# Patient Record
Sex: Female | Born: 1950 | Race: White | Hispanic: No | Marital: Married | Smoking: Former smoker
Health system: Southern US, Community
[De-identification: ages and names within clinical notes are randomized; demographics above are authoritative.]

## PROBLEM LIST (undated history)

## (undated) DIAGNOSIS — I10 Essential (primary) hypertension: Secondary | ICD-10-CM

## (undated) DIAGNOSIS — Z8041 Family history of malignant neoplasm of ovary: Secondary | ICD-10-CM

## (undated) DIAGNOSIS — Z8679 Personal history of other diseases of the circulatory system: Secondary | ICD-10-CM

## (undated) DIAGNOSIS — Z9889 Other specified postprocedural states: Secondary | ICD-10-CM

## (undated) DIAGNOSIS — E785 Hyperlipidemia, unspecified: Secondary | ICD-10-CM

## (undated) DIAGNOSIS — K219 Gastro-esophageal reflux disease without esophagitis: Secondary | ICD-10-CM

## (undated) DIAGNOSIS — Z780 Asymptomatic menopausal state: Secondary | ICD-10-CM

## (undated) DIAGNOSIS — E119 Type 2 diabetes mellitus without complications: Secondary | ICD-10-CM

## (undated) DIAGNOSIS — Z923 Personal history of irradiation: Secondary | ICD-10-CM

## (undated) DIAGNOSIS — N6009 Solitary cyst of unspecified breast: Secondary | ICD-10-CM

## (undated) DIAGNOSIS — Z808 Family history of malignant neoplasm of other organs or systems: Secondary | ICD-10-CM

## (undated) HISTORY — DX: Hyperlipidemia, unspecified: E78.5

## (undated) HISTORY — DX: Solitary cyst of unspecified breast: N60.09

## (undated) HISTORY — DX: Asymptomatic menopausal state: Z78.0

## (undated) HISTORY — DX: Essential (primary) hypertension: I10

## (undated) HISTORY — DX: Family history of malignant neoplasm of ovary: Z80.41

## (undated) HISTORY — DX: Personal history of other diseases of the circulatory system: Z86.79

## (undated) HISTORY — DX: Family history of malignant neoplasm of other organs or systems: Z80.8

## (undated) HISTORY — DX: Other specified postprocedural states: Z98.890

## (undated) HISTORY — PX: COLONOSCOPY: SHX174

## (undated) MED FILL — Dexamethasone Sodium Phosphate Inj 100 MG/10ML: INTRAMUSCULAR | Qty: 1 | Status: AC

---

## 1997-03-27 HISTORY — PX: BREAST CYST ASPIRATION: SHX578

## 2004-03-27 HISTORY — PX: CHOLECYSTECTOMY: SHX55

## 2004-03-27 HISTORY — PX: APPENDECTOMY: SHX54

## 2004-08-02 ENCOUNTER — Other Ambulatory Visit: Payer: Self-pay

## 2004-08-02 ENCOUNTER — Inpatient Hospital Stay: Payer: Self-pay | Admitting: Surgery

## 2007-01-18 LAB — HM COLONOSCOPY: HM Colonoscopy: NORMAL

## 2008-03-27 DIAGNOSIS — Z9889 Other specified postprocedural states: Secondary | ICD-10-CM

## 2008-03-27 HISTORY — DX: Other specified postprocedural states: Z98.890

## 2009-06-30 ENCOUNTER — Ambulatory Visit: Payer: Self-pay | Admitting: Gastroenterology

## 2009-08-06 ENCOUNTER — Ambulatory Visit: Payer: Self-pay

## 2009-08-12 ENCOUNTER — Ambulatory Visit: Payer: Self-pay

## 2010-12-02 ENCOUNTER — Observation Stay: Payer: Self-pay | Admitting: Internal Medicine

## 2010-12-27 ENCOUNTER — Ambulatory Visit (INDEPENDENT_AMBULATORY_CARE_PROVIDER_SITE_OTHER): Payer: BC Managed Care – PPO | Admitting: Internal Medicine

## 2010-12-27 ENCOUNTER — Encounter: Payer: Self-pay | Admitting: Internal Medicine

## 2010-12-27 VITALS — BP 163/77 | HR 84 | Temp 98.3°F | Resp 16 | Ht 66.5 in | Wt 192.2 lb

## 2010-12-27 DIAGNOSIS — E785 Hyperlipidemia, unspecified: Secondary | ICD-10-CM

## 2010-12-27 DIAGNOSIS — Z8679 Personal history of other diseases of the circulatory system: Secondary | ICD-10-CM | POA: Insufficient documentation

## 2010-12-27 DIAGNOSIS — I10 Essential (primary) hypertension: Secondary | ICD-10-CM | POA: Insufficient documentation

## 2010-12-27 DIAGNOSIS — Z9889 Other specified postprocedural states: Secondary | ICD-10-CM | POA: Insufficient documentation

## 2010-12-27 DIAGNOSIS — N6009 Solitary cyst of unspecified breast: Secondary | ICD-10-CM | POA: Insufficient documentation

## 2010-12-27 MED ORDER — PRAVASTATIN SODIUM 20 MG PO TABS: 20.0000 mg | ORAL_TABLET | Freq: Every evening | ORAL | Status: DC

## 2010-12-27 NOTE — Assessment & Plan Note (Signed)
Not at goal.  Will increase to 20 mg bid .

## 2010-12-27 NOTE — Assessment & Plan Note (Signed)
goalis , 100 given concern for angina/CAD.  Starting pravastatin at 20 mg edaily.  Repeat lipids in 6 week.s

## 2010-12-27 NOTE — Progress Notes (Signed)
  Subjective:    Patient ID: Ashlee Cox, female    DOB: 02-25-1951, 60 y.o.   MRN: [id number]  HPI  New patient, 58 you white female discharged from [facility] after observational stay earlier in September.  No PMH except hypertension, was admitted to hospitalist service on sept 9 th  with sudden onset of light headedness  and headache  while occurred while working a strenuous 3rd shift at [employer] .  She became diaphoretic and pale, develed left arm aching and fingers went numb.  Arm pain has improved  But her still aches.  She wad once set of cardiac enzymes,  An abnormal EKG,  A normal CXR, ECHO, MRI of brain and Carotids.  She is transferring care form [facility]. Past Medical History  Diagnosis Date  . H/O: rheumatic fever   . Menopause     at age 89  . 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 Cyst, breast     benign   No current outpatient prescriptions on file prior to visit.    Review of Systems  Constitutional: Negative for fever, chills and unexpected weight change.  HENT: Negative for hearing loss, ear pain, nosebleeds, congestion, sore throat, facial swelling, rhinorrhea, sneezing, mouth sores, trouble swallowing, neck pain, neck stiffness, voice change, postnasal drip, sinus pressure, tinnitus and ear discharge.   Eyes: Negative for pain, discharge, redness and visual disturbance.  Respiratory: Negative for cough, chest tightness, shortness of breath, wheezing and stridor.   Cardiovascular: Negative for chest pain, palpitations and leg swelling.  Musculoskeletal: Negative for myalgias and arthralgias.  Skin: Negative for color change and rash.  Neurological: Positive for headaches. Negative for dizziness, weakness and light-headedness.  Hematological: Negative for adenopathy.       Objective:   Physical Exam  Constitutional: She is oriented to person, place, and time. She appears well-developed and well-nourished.   HENT:  Mouth/Throat: Oropharynx is clear and moist.  Eyes: EOM are normal. Pupils are equal, round, and reactive to light. No scleral icterus.  Neck: Normal range of motion. Neck supple. No JVD present. No thyromegaly present.  Cardiovascular: Normal rate, regular rhythm, normal heart sounds and intact distal pulses.   Pulmonary/Chest: Effort normal and breath sounds normal.  Abdominal: Soft. Bowel sounds are normal. She exhibits no mass. There is no tenderness.  Musculoskeletal: Normal range of motion. She exhibits no edema.  Lymphadenopathy:    She has no cervical adenopathy.  Neurological: She is alert and oriented to person, place, and time.  Skin: Skin is warm and dry.  Psychiatric: She has a normal mood and affect.          Assessment & Plan:  Atypical chest pain:  Given her negative workup for CVA but abnormal EKG during recent admission , will refer to Dossie Arbour for cardiology evaluation.  Her epsiose of left arm pain was accompaneid by hypotension and diaphoresis so I did not give her SL nitroglycerin.

## 2010-12-27 NOTE — Patient Instructions (Addendum)
There are two white wines that are sweet varietals:  riesling ,  And gerwuzterammer.   Sake is also sweet but very potent,   We are going to increase your lisinopril to 40 mg daily,  Either all at once, or split into 20 mg twice daily

## 2010-12-30 ENCOUNTER — Telehealth: Payer: Self-pay | Admitting: Internal Medicine

## 2010-12-30 NOTE — Telephone Encounter (Signed)
Patient called and stated she had an appt and Dr. Darrick Huntsman wanted her to have a stress test done, but she hasn't heard anything.  Please advise patient when ready.

## 2011-01-02 ENCOUNTER — Encounter: Payer: Self-pay | Admitting: Internal Medicine

## 2011-01-12 ENCOUNTER — Encounter: Payer: Self-pay | Admitting: Cardiovascular Disease

## 2011-01-12 ENCOUNTER — Ambulatory Visit (INDEPENDENT_AMBULATORY_CARE_PROVIDER_SITE_OTHER): Payer: BC Managed Care – PPO | Admitting: Cardiovascular Disease

## 2011-01-12 DIAGNOSIS — I10 Essential (primary) hypertension: Secondary | ICD-10-CM

## 2011-01-12 DIAGNOSIS — R9431 Abnormal electrocardiogram [ECG] [EKG]: Secondary | ICD-10-CM

## 2011-01-12 DIAGNOSIS — E785 Hyperlipidemia, unspecified: Secondary | ICD-10-CM

## 2011-01-12 DIAGNOSIS — M79602 Pain in left arm: Secondary | ICD-10-CM

## 2011-01-12 DIAGNOSIS — M79609 Pain in unspecified limb: Secondary | ICD-10-CM

## 2011-01-12 NOTE — Patient Instructions (Signed)
You are doing well. No medication changes were made. We will schedule you for a treadmill study  Please call us if you have new issues that need to be addressed

## 2011-01-12 NOTE — Assessment & Plan Note (Signed)
Etiology of her arm discomfort and headache is uncertain.  It is also concerning for a small TIA. Uncertain if she could have a PFO as a cause of possible TIA. Treatment for now would be aspirin. She has a repeat neurologic-type event, the echocardiogram was normal study would be indicated.

## 2011-01-12 NOTE — Assessment & Plan Note (Signed)
We would agree with continued aggressive medical management for cholesterol.

## 2011-01-12 NOTE — Progress Notes (Signed)
Patient ID: Ashlee Cox, female    DOB: 05/01/1950, 60 y.o.   MRN: [id number]  HPI Comments: Ashlee Cox is a very pleasant 60 year old woman, patient of Dr. Darrick Huntsman who presents by referral for evaluation of symptoms of left arm discomfort and right head pain, abnormal EKG.  She reports that on September 7 she was working at [employer]. She works third shift. 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 She had acute onset of malaise, feeling sick, sweaty as if she was "going down ". She lay down and had a fan on her and she started to feel better. She took an extra 45 minutes and when she woke up, she had left arm numbness and tingling, discomfort and pain in the right side of her head. EMTs were called after she got concerned and she had talked to her son who is in the medical field. EMTs noticed that her blood pressure was adequate. In the hospital, she was noted to have an abnormal EKG with diffuse ST abnormality, negative cardiac enzymes times one, other workup including CT scan, MRI, carotid ultrasound was essentially normal./.  Echocardiogram was also essentially normal with ejection fraction estimated at 50-55%. Saline Doppler study looking for PFO or ASD was not performed.  She was discharged on aspirin p.o. She denies any further episodes of left arm discomfort since that time. She has been back at work and has been active with no complaints  EKG today shows normal sinus rhythm with rate of 71 beats per minute with nonspecific ST abnormality in V4 through V6, T wave abnormality in one and aVL EKG done previously on October 7 shows diffuse ST and T wave abnormality in V3 through V6, T wave abnormality in one and aVL  Outpatient Encounter Prescriptions as of 01/12/2011  Medication Sig Dispense Refill  . aspirin 81 MG tablet Take 81 mg by mouth daily.        Marland Kitchen lisinopril (PRINIVIL,ZESTRIL) 20 MG tablet Take 40 mg by mouth daily.        . Multiple Vitamin (MULTIVITAMIN) tablet Take 1 tablet by mouth daily.        Marland Kitchen omeprazole (PRILOSEC) 20 MG capsule Take 20 mg by mouth daily.        . pravastatin (PRAVACHOL) 20 MG tablet Take 1 tablet (20 mg total) by mouth every evening.  30 tablet  11      Review of Systems  Constitutional: Negative.   Eyes: Negative.   Respiratory: Negative.   Cardiovascular: Negative.   Gastrointestinal: Negative.   Musculoskeletal: Negative.        Left arm discomfort  Skin: Negative.   Neurological: Positive for headaches.  Hematological: Negative.   Psychiatric/Behavioral: Negative.   All other systems reviewed and are negative.    BP 156/95  Pulse 76  Ht 5\' 7"  (1.702 m)  Wt 194 lb (87.998 kg)  BMI 30.38 kg/m2   Physical Exam  Nursing note and vitals reviewed. Constitutional: She is oriented to person, place, and time. She appears well-developed and well-nourished.  HENT:  Head: Normocephalic.  Nose: Nose normal.  Mouth/Throat: Oropharynx is clear and moist.  Eyes: Conjunctivae are normal. Pupils are equal, round, and reactive to light.  Neck: Normal range of motion. Neck supple. No JVD present.  Cardiovascular: Normal rate, regular rhythm, S1 normal, S2 normal, normal heart sounds and intact distal pulses.  Exam reveals no gallop and no friction  rub.   No murmur heard. Pulmonary/Chest: Effort normal and breath sounds normal. No respiratory distress. She has no wheezes. She has no rales. She exhibits no tenderness.  Abdominal: Soft. Bowel sounds are normal. She exhibits no distension. There is no tenderness.  Musculoskeletal: Normal range of motion. She exhibits no edema and no tenderness.  Lymphadenopathy:    She has no cervical adenopathy.  Neurological: She is alert and oriented to person, place, and time. Coordination normal.  Skin: Skin is warm and dry. No rash noted. No erythema.  Psychiatric: She has a normal mood and affect. Her behavior is normal. Judgment and  thought content normal.         Assessment and Plan

## 2011-01-12 NOTE — Assessment & Plan Note (Signed)
EKG was clearly abnormal on October 7. These abnormalities are still present though to a small degree. Continued nonspecific ST and T wave abnormality in the anterolateral leads. Given her recent left arm discomfort, we will schedule her for a routine treadmill study. She has been active over the past week with no symptoms of chest pain or left arm pain with exertion. Hopefully a treadmill study would help risk stratify her. If she has significant lateral ST abnormality with treadmill, we would order a Myoview.

## 2011-01-12 NOTE — Assessment & Plan Note (Signed)
Blood pressure is mildly elevated on today's visit. We've asked her to monitor her blood pressure at home and followup with Dr. Darrick Huntsman.

## 2011-01-13 ENCOUNTER — Encounter: Payer: Self-pay | Admitting: Cardiovascular Disease

## 2011-01-20 ENCOUNTER — Ambulatory Visit (INDEPENDENT_AMBULATORY_CARE_PROVIDER_SITE_OTHER): Payer: BC Managed Care – PPO | Admitting: Cardiovascular Disease

## 2011-01-20 ENCOUNTER — Encounter: Payer: Self-pay | Admitting: Cardiovascular Disease

## 2011-01-20 DIAGNOSIS — M79602 Pain in left arm: Secondary | ICD-10-CM

## 2011-01-20 DIAGNOSIS — R9431 Abnormal electrocardiogram [ECG] [EKG]: Secondary | ICD-10-CM

## 2011-01-20 DIAGNOSIS — M79609 Pain in unspecified limb: Secondary | ICD-10-CM

## 2011-01-20 NOTE — Progress Notes (Signed)
Exercise Treadmill Test  Treadmill ordered for recent epsiodes of left arm pain.  Resting EKG shows NSR with rate of 83 bpm, diffuse nonspecific ST ABN (seen on previous EKG from 2011) Resting blood pressure of 150/80 Stand bruce protocal was used.  Patient exercised for 6 min 01 sec,  Peak heart rate of 150 bpm.  This was 93% of the maximum predicted heart rate (target heart rate 137). No symptoms of chest pain or lightheadedness were reported at peak stress or in recovery.  Peak Blood pressure recorded was 190/82. Heart rate at 3 minutes in recovery was 103 bpm. No significant ST change noted at peak exercise or in recovery concerning for ischemia.  FINAL IMPRESSION: Normal exercise stress test. No significant EKG changes concerning for ischemia. Average exercise tolerance. She does have baseline nonspecific ST ABN, seen previously in 2011.

## 2011-01-20 NOTE — Patient Instructions (Signed)
Normal stress test No further testing at this time. Please call if you have additional episodes of chest or arm pain.

## 2011-02-07 ENCOUNTER — Other Ambulatory Visit (INDEPENDENT_AMBULATORY_CARE_PROVIDER_SITE_OTHER): Payer: BC Managed Care – PPO | Admitting: *Deleted

## 2011-02-07 ENCOUNTER — Telehealth: Payer: Self-pay | Admitting: Internal Medicine

## 2011-02-07 DIAGNOSIS — E785 Hyperlipidemia, unspecified: Secondary | ICD-10-CM

## 2011-02-07 LAB — COMPREHENSIVE METABOLIC PANEL
Albumin: 4.2 g/dL (ref 3.5–5.2)
BUN: 23 mg/dL (ref 6–23)
CO2: 24 mEq/L (ref 19–32)
GFR: 58.44 mL/min — ABNORMAL LOW (ref >60.00)
Glucose, Bld: 82 mg/dL (ref 70–99)
Potassium: 4 mEq/L (ref 3.5–5.1)
Sodium: 140 mEq/L (ref 135–145)
Total Protein: 7.8 g/dL (ref 6.0–8.3)

## 2011-02-07 LAB — LIPID PANEL: Cholesterol: 187 mg/dL (ref 0–200)

## 2011-02-07 NOTE — Telephone Encounter (Signed)
Patient is asking if she can get rx for lisinopril 40mg  since she was told to increase to 40 mg daily and she has been taking two of the 20 mg.

## 2011-02-07 NOTE — Telephone Encounter (Signed)
Pt   Needs refill on lisinopril 40mg  tab internl lb The rx was for 20 mg but she said dr Darrick Huntsman double this meds she is taking 2 of the 20mg  can she get this in the 40mg  [place] [address]  She only has 3 days left

## 2011-02-09 ENCOUNTER — Other Ambulatory Visit: Payer: Self-pay | Admitting: Internal Medicine

## 2011-02-09 MED ORDER — LISINOPRIL 40 MG PO TABS: 40.0000 mg | ORAL_TABLET | Freq: Every day | ORAL | Status: DC

## 2011-02-09 MED ORDER — PRAVASTATIN SODIUM 40 MG PO TABS: 40.0000 mg | ORAL_TABLET | Freq: Every day | ORAL | Status: DC

## 2011-02-09 NOTE — Progress Notes (Signed)
Addended by: Darletta Moll A on: 02/09/2011 11:55 AM   Modules accepted: Orders

## 2011-03-06 ENCOUNTER — Other Ambulatory Visit: Payer: Self-pay | Admitting: Internal Medicine

## 2011-03-08 MED ORDER — OMEPRAZOLE 20 MG PO CPDR: 20.0000 mg | DELAYED_RELEASE_CAPSULE | Freq: Every day | ORAL | Status: DC

## 2011-03-08 MED ORDER — PRAVASTATIN SODIUM 40 MG PO TABS: 40.0000 mg | ORAL_TABLET | Freq: Every day | ORAL | Status: DC

## 2011-03-09 ENCOUNTER — Ambulatory Visit: Payer: Self-pay | Admitting: Internal Medicine

## 2011-03-20 LAB — HM MAMMOGRAPHY: HM Mammogram: NORMAL

## 2011-04-10 ENCOUNTER — Encounter: Payer: Self-pay | Admitting: Internal Medicine

## 2011-07-24 ENCOUNTER — Other Ambulatory Visit: Payer: Self-pay | Admitting: Internal Medicine

## 2012-01-12 ENCOUNTER — Other Ambulatory Visit: Payer: Self-pay | Admitting: Internal Medicine

## 2012-01-12 NOTE — Telephone Encounter (Signed)
Refill request for Pravastatin 20 mg ok to refill?

## 2012-01-13 NOTE — Telephone Encounter (Signed)
Sent to pharmacy 

## 2012-01-18 ENCOUNTER — Ambulatory Visit (INDEPENDENT_AMBULATORY_CARE_PROVIDER_SITE_OTHER): Payer: BC Managed Care – PPO | Admitting: Internal Medicine

## 2012-01-18 ENCOUNTER — Encounter: Payer: Self-pay | Admitting: Internal Medicine

## 2012-01-18 ENCOUNTER — Telehealth: Payer: Self-pay | Admitting: *Deleted

## 2012-01-18 VITALS — BP 162/90 | HR 78 | Temp 98.2°F | Ht 66.5 in | Wt 197.0 lb

## 2012-01-18 DIAGNOSIS — R7309 Other abnormal glucose: Secondary | ICD-10-CM

## 2012-01-18 DIAGNOSIS — R5383 Other fatigue: Secondary | ICD-10-CM

## 2012-01-18 DIAGNOSIS — E785 Hyperlipidemia, unspecified: Secondary | ICD-10-CM

## 2012-01-18 DIAGNOSIS — Z79899 Other long term (current) drug therapy: Secondary | ICD-10-CM

## 2012-01-18 DIAGNOSIS — I10 Essential (primary) hypertension: Secondary | ICD-10-CM

## 2012-01-18 DIAGNOSIS — R5381 Other malaise: Secondary | ICD-10-CM

## 2012-01-18 DIAGNOSIS — R739 Hyperglycemia, unspecified: Secondary | ICD-10-CM

## 2012-01-18 DIAGNOSIS — F411 Generalized anxiety disorder: Secondary | ICD-10-CM

## 2012-01-18 MED ORDER — PRAVASTATIN SODIUM 40 MG PO TABS: 40.0000 mg | ORAL_TABLET | Freq: Every day | ORAL | Status: DC

## 2012-01-18 MED ORDER — LISINOPRIL 40 MG PO TABS: 40.0000 mg | ORAL_TABLET | Freq: Every day | ORAL | Status: DC

## 2012-01-18 MED ORDER — ALPRAZOLAM 0.25 MG PO TABS: 0.2500 mg | ORAL_TABLET | Freq: Two times a day (BID) | ORAL | Status: DC | PRN

## 2012-01-18 NOTE — Progress Notes (Signed)
Patient ID: Ashlee Cox, female   DOB: 1950/03/31, 61 y.o.   MRN: [id number]  Patient Active Problem List  Diagnosis  . H/O: rheumatic fever  . History of colonoscopy  . Cyst, breast  . Hypertension  . Hyperlipidemia LDL goal < 100  . Abnormal EKG  . Anxiety state    Subjective:  CC:   No chief complaint on file.   HPI:   Ashlee Riceis a 61 y.o. female who presents Anxiety, uncontrolled, with fatigue, insomnia.  Family stressors trigger.  Her husband has been diagnosed with metastatic prostate Cancer, and she is caring for him at home.  She is also involved with the care and management of her sister who is widowed,  has diabetes and PAD/retinopathy and cannot drive . Her sister is moving into her house in the very near future because of financial and physical disabilities.  Patient is working 3rd shift At [employer].  She is averaging 5 hours of sleep daily .   Past Medical History  Diagnosis Date  . H/O: rheumatic fever   . Menopause     at age 69  . 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 Cyst, breast     benign    Past Surgical History  Procedure Date  . Appendectomy 2006  . Cholecystectomy 2006  . Vaginal delivery     x3         The following portions of the patient's history were reviewed and updated as appropriate: Allergies, current medications, and problem list.    Review of Systems:   12 Pt  review of systems was negative except those addressed in the HPI,     History   Social History  . Marital Status: Married    Spouse Name: N/A    Number of Children: N/A  . 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 Smoking status: Former Smoker -- 1 years    Types: Cigarettes    Quit date: 12/26/2005  . Smokeless tobacco: Never Used   Comment: smoked for less than 1 years,  1 cig/day  . Alcohol Use: No  . Drug Use: No  . Sexually Active: Not  on file   Other Topics Concern  . Not on file   Social History Narrative  . No narrative on file    Objective:  BP 162/90  Pulse 78  Temp 98.2 F (36.8 C)  Ht 5' 6.5" (1.689 m)  Wt 197 lb (89.359 kg)  BMI 31.32 kg/m2  SpO2 97%  General appearance: alert, cooperative and appears stated age Ears: normal TM's and external ear canals both ears Throat: lips, mucosa, and tongue normal; teeth and gums normal Neck: no adenopathy, no carotid bruit, supple, symmetrical, trachea midline and thyroid not enlarged, symmetric, no tenderness/mass/nodules Back: symmetric, no curvature. ROM normal. No CVA tenderness. Lungs: clear to auscultation bilaterally Heart: regular rate and rhythm, S1, S2 normal, no murmur, click, rub or gallop Abdomen: soft, non-tender; bowel sounds normal; no masses,  no organomegaly Pulses: 2+ and symmetric Skin: Skin color, texture, turgor normal. No rashes or lesions Lymph nodes: Cervical, supraclavicular, and axillary nodes normal.  Assessment and Plan:  Hypertension And lisinopril 20 mg daily. Today's blood pressure is elevated which may be due to her untreated anxiety. I've asked her check her blood pressure several times over the next several  weeks and report back to me.  Anxiety state Secondary to recent family stressors including the diagnosis of metastatic cancer in years husband complicated by her responsibilities which her sister who is widowed and disabled. Spent 20 minutes counseling her on her need to ask for help. Specifically a recommended that she contact her husband's physician in order to get to home health nursing assistance. I've also recommended that she set boundaries with her sister who is at high risk for becoming more dependent on patient when she is moved in with her. Trial of alprazolam for sleep and his initiation and management of other panic symptoms. We'll discuss SSRI therapy at her next visit if still requiring use of alprazolam daily  basis.   Updated Medication List Outpatient Encounter Prescriptions as of 01/18/2012  Medication Sig Dispense Refill  . aspirin 81 MG tablet Take 81 mg by mouth daily.        . Cholecalciferol (VITAMIN D-3) 1000 UNITS CAPS Take 1,000 Units by mouth daily.      Marland Kitchen lisinopril (PRINIVIL,ZESTRIL) 40 MG tablet Take 1 tablet (40 mg total) by mouth daily.  90 tablet  5  . Multiple Vitamin (MULTIVITAMIN) tablet Take 1 tablet by mouth daily.        Marland Kitchen omeprazole (PRILOSEC) 20 MG capsule Take 1 capsule (20 mg total) by mouth daily.  90 capsule  3  . DISCONTD: lisinopril (PRINIVIL,ZESTRIL) 40 MG tablet TAKE ONE TABLET BY MOUTH EVERY DAY  30 tablet  5  . DISCONTD: pravastatin (PRAVACHOL) 20 MG tablet TAKE ONE TABLET BY MOUTH IN THE EVENING  30 tablet  10  . ALPRAZolam (XANAX) 0.25 MG tablet Take 1 tablet (0.25 mg total) by mouth 2 (two) times daily as needed for sleep.  60 tablet  3  . pravastatin (PRAVACHOL) 40 MG tablet Take 1 tablet (40 mg total) by mouth daily.  30 tablet  3  . DISCONTD: pravastatin (PRAVACHOL) 40 MG tablet Take 1 tablet (40 mg total) by mouth daily.  30 tablet  3     Orders Placed This Encounter  Procedures  . HM MAMMOGRAPHY  . HM PAP SMEAR  . Lipid panel  . Comprehensive metabolic panel  . TSH  . LDL cholesterol, direct  . HM COLONOSCOPY    Return in about 3 months (around 04/19/2012).

## 2012-01-18 NOTE — Patient Instructions (Addendum)
I am prescribing alprazolam for anxiety induced insomnia.  Start with 1/2 tablet and repeat in 1/2 hr if no sleep.  You may need to increase to 2 tablets ,  You have 60/month in case you need them.  Can also be used during the day or night for panic   Check bp and pulse a few times over the next 2 weeks,  Call the office if bp is consistently > 140/80  And record pulse as well

## 2012-01-18 NOTE — Telephone Encounter (Signed)
Called patient to let her know that the script for Alprazolam has been faxed to the pharmacy. She verbalized understanding

## 2012-01-19 LAB — COMPREHENSIVE METABOLIC PANEL
ALT: 27 U/L (ref 0–35)
CO2: 23 mEq/L (ref 19–32)
Calcium: 9.8 mg/dL (ref 8.4–10.5)
Chloride: 109 mEq/L (ref 96–112)
GFR: 67.2 mL/min (ref >60.00)
Glucose, Bld: 121 mg/dL — ABNORMAL HIGH (ref 70–99)
Sodium: 141 mEq/L (ref 135–145)
Total Bilirubin: 0.7 mg/dL (ref 0.3–1.2)
Total Protein: 7.7 g/dL (ref 6.0–8.3)

## 2012-01-19 LAB — LIPID PANEL
Cholesterol: 210 mg/dL — ABNORMAL HIGH (ref 0–200)
Total CHOL/HDL Ratio: 5

## 2012-01-19 LAB — TSH: TSH: 1.55 u[IU]/mL (ref 0.35–5.50)

## 2012-01-20 ENCOUNTER — Encounter: Payer: Self-pay | Admitting: Internal Medicine

## 2012-01-20 DIAGNOSIS — F411 Generalized anxiety disorder: Secondary | ICD-10-CM | POA: Insufficient documentation

## 2012-01-20 NOTE — Assessment & Plan Note (Signed)
And lisinopril 20 mg daily. Today's blood pressure is elevated which may be due to her untreated anxiety. I've asked her check her blood pressure several times over the next several weeks and report back to me.

## 2012-01-20 NOTE — Assessment & Plan Note (Signed)
Secondary to recent family stressors including the diagnosis of metastatic cancer in years husband complicated by her responsibilities which her sister who is widowed and disabled. Spent 20 minutes counseling her on her need to ask for help. Specifically a recommended that she contact her husband's physician in order to get to home health nursing assistance. I've also recommended that she set boundaries with her sister who is at high risk for becoming more dependent on patient when she is moved in with her. Trial of alprazolam for sleep and his initiation and management of other panic symptoms. We'll discuss SSRI therapy at her next visit if still requiring use of alprazolam daily basis.

## 2012-01-21 MED ORDER — SIMVASTATIN 40 MG PO TABS: 40.0000 mg | ORAL_TABLET | Freq: Every day | ORAL | Status: DC

## 2012-01-21 NOTE — Addendum Note (Signed)
Addended by: Sherlene Shams on: 01/21/2012 12:41 PM   Modules accepted: Orders

## 2012-04-02 ENCOUNTER — Encounter: Payer: Self-pay | Admitting: Internal Medicine

## 2012-04-02 ENCOUNTER — Ambulatory Visit (INDEPENDENT_AMBULATORY_CARE_PROVIDER_SITE_OTHER): Payer: BC Managed Care – PPO | Admitting: Internal Medicine

## 2012-04-02 VITALS — BP 160/92 | HR 90 | Temp 97.9°F | Resp 16 | Wt 200.5 lb

## 2012-04-02 DIAGNOSIS — Z79899 Other long term (current) drug therapy: Secondary | ICD-10-CM

## 2012-04-02 DIAGNOSIS — R739 Hyperglycemia, unspecified: Secondary | ICD-10-CM

## 2012-04-02 DIAGNOSIS — F411 Generalized anxiety disorder: Secondary | ICD-10-CM

## 2012-04-02 DIAGNOSIS — R7309 Other abnormal glucose: Secondary | ICD-10-CM

## 2012-04-02 DIAGNOSIS — I10 Essential (primary) hypertension: Secondary | ICD-10-CM

## 2012-04-02 DIAGNOSIS — F4381 Prolonged grief disorder: Secondary | ICD-10-CM

## 2012-04-02 DIAGNOSIS — Z23 Encounter for immunization: Secondary | ICD-10-CM

## 2012-04-02 DIAGNOSIS — R9431 Abnormal electrocardiogram [ECG] [EKG]: Secondary | ICD-10-CM

## 2012-04-02 DIAGNOSIS — F329 Major depressive disorder, single episode, unspecified: Secondary | ICD-10-CM

## 2012-04-02 DIAGNOSIS — F4329 Adjustment disorder with other symptoms: Secondary | ICD-10-CM

## 2012-04-02 LAB — HEMOGLOBIN A1C: Hgb A1c MFr Bld: 6.7 % — ABNORMAL HIGH (ref 4.6–6.5)

## 2012-04-02 LAB — LDL CHOLESTEROL, DIRECT: Direct LDL: 84.8 mg/dL

## 2012-04-02 LAB — COMPREHENSIVE METABOLIC PANEL
ALT: 31 U/L (ref 0–35)
BUN: 14 mg/dL (ref 6–23)
CO2: 26 mEq/L (ref 19–32)
Calcium: 10.2 mg/dL (ref 8.4–10.5)
Chloride: 105 mEq/L (ref 96–112)
Creatinine, Ser: 1 mg/dL (ref 0.4–1.2)
GFR: 63.9 mL/min (ref >60.00)
Total Bilirubin: 1 mg/dL (ref 0.3–1.2)

## 2012-04-02 MED ORDER — ALPRAZOLAM 0.25 MG PO TABS: 0.2500 mg | ORAL_TABLET | Freq: Two times a day (BID) | ORAL | Status: DC | PRN

## 2012-04-02 MED ORDER — LOSARTAN POTASSIUM 100 MG PO TABS: 100.0000 mg | ORAL_TABLET | Freq: Every day | ORAL | Status: DC

## 2012-04-02 MED ORDER — TRAZODONE HCL 50 MG PO TABS: 25.0000 mg | ORAL_TABLET | Freq: Every evening | ORAL | Status: DC | PRN

## 2012-04-02 MED ORDER — SERTRALINE HCL 50 MG PO TABS: 50.0000 mg | ORAL_TABLET | Freq: Every day | ORAL | Status: DC

## 2012-04-02 NOTE — Progress Notes (Signed)
Patient ID: Ashlee Cox, female   DOB: [birth year], 62 y.o.   MRN: [id number]  Patient Active Problem List  Diagnosis  . H/O: rheumatic fever  . History of colonoscopy  . Cyst, breast  . Hypertension  . Hyperlipidemia LDL goal < 100  . Abnormal EKG  . Anxiety state    Subjective:  CC:   Chief Complaint  Patient presents with  . Personal    HPI:   Ashlee Cox a 62 y.o. female who presents 3 month follow up on hypertension and anxiety.  Husband of 13 years has metastatic prostate  CA and has been hospitalized  for 9 days with a UTI and pneumonia, accompanied by SVT.  She is planning to either take him home with Hospice or transfer him to [facility]. Patient has been out of work from [employer] on 3 months leave which is getting ready to run out .  She is completely distraught.  She is taking alprazolam twice daily as needed. Has episodes  of extreme anxiety and panic attacks which cause her to become tremulous, diaphoretic with chest pain and left arm pain which resolve with use of medications for anxiety .  Her blood pressure has been uncontrolled with elevations to 150/85 all week despite daily compliance with medications .  She takes 20 mg of lisinopril daily.  She is not sleepign more than 4 hours per night due to frequent wakenings 2 or 3 times per night.  She is  having trouble focusing on simple tasks at home . Has considered suicide in the past but not since she has been taking her medication.  She is requesting grief counselling from Hospice started right away,    Past Medical History  Diagnosis Date  . H/O: rheumatic fever   . Menopause     at age 78  . 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 Cyst, breast     benign    Past Surgical History  Procedure Date  . Appendectomy 2006  . Cholecystectomy 2006  . Vaginal delivery     x3         The following portions of the patient's history were reviewed and updated  as appropriate: Allergies, current medications, and problem list.    Review of Systems:   Patient denies headache, fevers, , unintentional weight loss, skin rash, eye pain, sinus congestion and sinus pain, sore throat, dysphagia,  hemoptysis , cough, dyspnea, wheezing, chest pain, palpitations, orthopnea, edema, abdominal pain, nausea, melena, diarrhea, constipation, flank pain, dysuria, hematuria, urinary  Frequency, nocturia, numbness, tingling, seizures,  Focal weakness, Loss of consciousness,  Tremor, depression, and suicidal ideation.       History   Social History  . Marital Status: Married    Spouse Name: N/A    Number of Children: N/A  . 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 Smoking status: Former Smoker -- 1 years    Types: Cigarettes    Quit date: 12/26/2005  . Smokeless tobacco: Never Used     Comment: smoked for less than 1 years,  1 cig/day  . Alcohol Use: No  . Drug Use: No  . Sexually Active: Not on file   Other Topics Concern  . Not on file   Social History Narrative  . No narrative on file    Objective:  BP  160/92  Pulse 90  Temp 97.9 F (36.6 C) (Oral)  Resp 16  Wt 200 lb 8 oz (90.946 kg)  SpO2 96%  General appearance: alert, cooperative and appears stated age Ears: normal TM's and external ear canals both ears Throat: lips, mucosa, and tongue normal; teeth and gums normal Neck: no adenopathy, no carotid bruit, supple, symmetrical, trachea midline and thyroid not enlarged, symmetric, no tenderness/mass/nodules Back: symmetric, no curvature. ROM normal. No CVA tenderness. Lungs: clear to auscultation bilaterally Heart: regular rate and rhythm, S1, S2 normal, no murmur, click, rub or gallop Abdomen: soft, non-tender; bowel sounds normal; no masses,  no organomegaly Pulses: 2+ and symmetric Skin: Skin color, texture, turgor normal. No rashes or lesions Lymph nodes: Cervical,  supraclavicular, and axillary nodes normal.  Assessment and Plan:  Anxiety state Uncontrolled, due to husband's impending death. She's having difficulty functioning and her daughter has stepped in to help. We'll start her on Zoloft, and trazodone for insomnia , continue when necessary alprazolam for panic and severe anxiety and refer to Hospice for grief counseling. I will see her back in 2 weeks.  Abnormal EKG Which she underwent stress testing which was normal.  Hypertension Controlled on current medications. Changing lisinopril to losartan 100 mg daily. Recheck in renal function today. Return for followup in 2 weeks.   Updated Medication List Outpatient Encounter Prescriptions as of 04/02/2012  Medication Sig Dispense Refill  . ALPRAZolam (XANAX) 0.25 MG tablet Take 1 tablet (0.25 mg total) by mouth 2 (two) times daily as needed for sleep.  60 tablet  3  . aspirin 81 MG tablet Take 81 mg by mouth daily.        . Cholecalciferol (VITAMIN D-3) 1000 UNITS CAPS Take 1,000 Units by mouth daily.      . Multiple Vitamin (MULTIVITAMIN) tablet Take 1 tablet by mouth daily.        Marland Kitchen omeprazole (PRILOSEC) 20 MG capsule Take 1 capsule (20 mg total) by mouth daily.  90 capsule  3  . simvastatin (ZOCOR) 40 MG tablet Take 1 tablet (40 mg total) by mouth at bedtime.  90 tablet  3  . [DISCONTINUED] ALPRAZolam (XANAX) 0.25 MG tablet Take 1 tablet (0.25 mg total) by mouth 2 (two) times daily as needed for sleep.  60 tablet  3  . [DISCONTINUED] lisinopril (PRINIVIL,ZESTRIL) 40 MG tablet Take 1 tablet (40 mg total) by mouth daily.  90 tablet  5  . losartan (COZAAR) 100 MG tablet Take 1 tablet (100 mg total) by mouth daily.  90 tablet  3  . sertraline (ZOLOFT) 50 MG tablet Take 1 tablet (50 mg total) by mouth daily.  30 tablet  3  . traZODone (DESYREL) 50 MG tablet Take 0.5-1 tablets (25-50 mg total) by mouth at bedtime as needed for sleep.  60 tablet  3     Orders Placed This Encounter  Procedures  .  LDL cholesterol, direct  . Ambulatory referral to Hospice    No Follow-up on file.

## 2012-04-02 NOTE — Assessment & Plan Note (Signed)
Which she underwent stress testing which was normal.

## 2012-04-02 NOTE — Assessment & Plan Note (Addendum)
Uncontrolled, due to husband's impending death. She's having difficulty functioning and her daughter has stepped in to help. We'll start her on Zoloft, and trazodone for insomnia , continue when necessary alprazolam for panic and severe anxiety and refer to Hospice for grief counseling. I will see her back in 2 weeks.

## 2012-04-02 NOTE — Patient Instructions (Addendum)
We are changing lisinopril to losartan  100 mg one tablet daily in the morning  We are adding generic  zoloft (sertraline ) for your anxiety .  Take 1/2 tablet daily for 4 days,  Then increases to  whole tablet once daily   Trazodone 1 or 2 tablets as needed for insomnia  We will start referral for grief counselling  Return in 2 weeks

## 2012-04-02 NOTE — Assessment & Plan Note (Signed)
Controlled on current medications. Changing lisinopril to losartan 100 mg daily. Recheck in renal function today. Return for followup in 2 weeks.

## 2012-04-04 DIAGNOSIS — Z0279 Encounter for issue of other medical certificate: Secondary | ICD-10-CM

## 2012-04-05 ENCOUNTER — Telehealth: Payer: Self-pay | Admitting: Internal Medicine

## 2012-04-05 NOTE — Telephone Encounter (Signed)
Pt aware complex disabilty forms are ready to be picked up.  Pt asked form are to be faxed to [phone] forms were faxed

## 2012-04-16 ENCOUNTER — Encounter: Payer: Self-pay | Admitting: Internal Medicine

## 2012-04-16 ENCOUNTER — Ambulatory Visit (INDEPENDENT_AMBULATORY_CARE_PROVIDER_SITE_OTHER): Payer: BC Managed Care – PPO | Admitting: Internal Medicine

## 2012-04-16 VITALS — BP 160/80 | HR 85 | Temp 98.1°F | Resp 16 | Wt 194.0 lb

## 2012-04-16 DIAGNOSIS — F411 Generalized anxiety disorder: Secondary | ICD-10-CM

## 2012-04-16 MED ORDER — AMLODIPINE BESYLATE 5 MG PO TABS: 5.0000 mg | ORAL_TABLET | Freq: Every day | ORAL | Status: DC

## 2012-04-16 NOTE — Patient Instructions (Addendum)
i am starting amlodipine for your blood pressure.  contineu the losartan as well  Return for fasting labs in early march  You need to lose 10%  Of your current body weight over the next 6 months  To help your blood pressure and prevent progression to diabetes   This is  my version of a  "Low GI"  Diet:  It is not ultra low carb, but will still lower your blood sugars and allow you to lose 5 to 10 lbs per month if you follow it carefully. All of the foods can be found at grocery stores and in bulk at [employer].  The Atkins protein bars and shakes are available in more varieties at Target, WalMart and Lowe's Foods.     7 AM Breakfast:  Low carbohydrate Protein  Shakes (I recommend the EAS AdvantEdge "Carb Control" shakes  Or the low carb shakes by Atkins.   Both are available everywhere:  In  cases at BJs  Or in 4 packs at grocery stores and pharmacies  2.5 carbs  (Alternative is  a toasted Arnold's Sandwhich Thin w/ peanut butter, a "Bagel Thin" with cream cheese and salmon) or  a scrambled egg burrito made with a low carb tortilla .  Avoid cereal and bananas, oatmeal too unless you are cooking the old fashioned kind that takes 30-40 minutes to prepare.  the rest is overly processed, has minimal fiber, and is loaded with carbohydrates!   10 AM: Protein bar by Atkins (the snack size, under 200 cal).  There are many varieties , available widely again or in bulk in limited varieties at BJs)  Other so called "protein bars" tend to be loaded with carbohydrates.  Remember, in food advertising, the word "energy" is synonymous for " carbohydrate."  Lunch: sandwich of [place], (or any lunchmeat, grilled meat or canned tuna), fresh avocado, mayonnaise  and cheese on a lower carbohydrate pita bread, flatbread, or tortilla . Ok to use regular mayonnaise. The bread is the only source or carbohydrate that can be decreased (Joseph's makes a pita bread and a flat bread that are 50 cal and 4 net carbs ; Toufayan makes a  low carb flatbread that's 100 cal and 9 net carbs  and  Mission makes a low carb whole wheat tortilla  That is 210 cal and 6 net carbs)  3 PM:  Mid day :  Another protein bar,  Or a  cheese stick (100 cal, 0 carbs),  Or 1 ounce of  almonds, walnuts, pistachios, pecans, peanuts,  Macadamia nuts. Or a Dannon light n Fit greek yogurt, 80 cal 8 net carbs . Avoid "granola"; the dried cranberries and raisins are loaded with carbohydrates. Mixed nuts ok if no raisins or cranberries or dried fruit.      6 PM  Dinner:  "mean and green:"  Meat/chicken/fish or a high protein legume; , with a green salad, and a low GI  Veggie (broccoli, cauliflower, green beans, spinach, brussel sprouts. Lima beans) : Avoid "Low fat dressings, as well as Reyne Dumas and [address]! They are loaded with sugar! Instead use ranch, vinagrette,  Blue cheese, etc.  There is a low carb pasta by Dreamfield's available at [facility] that is acceptable and tastes great. Try Michel Angel's chicken piccata over low carb pasta. The chicken dish is 0 carbs, and can be found in frozen section at BJs and Lowe's. Also try [employer] "Carnitas" (pulled pork, no sauce,  0 carbs) and his  pot roast.   both are in the refrigerated section at BJs   Dreamfield's makes a low carb pasta only 5 g/serving.  Available at all grocery stores,  And tastes like normal pasta  9 PM snack : Breyer's "low carb" fudgsicle or  ice cream bar (Carb Smart line), or  Weight Watcher's ice cream bar , or another "no sugar added" ice cream;a serving of fresh berries/cherries with whipped cream (Avoid bananas, pineapple, grapes  and watermelon on a regular basis because they are high in sugar)   Remember that snack Substitutions should be less than 10 carbs per serving and meals < 20 carbs. Remember to subtract fiber grams and sugar alcohols to get the "net carbs."

## 2012-04-16 NOTE — Progress Notes (Signed)
Patient ID: Ashlee Cox, female   DOB: 1951-02-02, 62 y.o.   MRN: [id number]  Patient Active Problem List  Diagnosis  . H/O: rheumatic fever  . History of colonoscopy  . Cyst, breast  . Hypertension  . Hyperlipidemia LDL goal < 100  . Abnormal EKG  . Anxiety state    Subjective:  CC:   Chief Complaint  Patient presents with  . Follow-up    HPI:   Ashlee Cox a 62 y.o. 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  Her husband is now at [facility]. She has been to grief counselling and does not need psychiatric counselling.  She is at peace now and managing her husband's decline with much less anxiety.  However, her FMLA application mandates that she have a psychiatric evaluation. .  Sleeping well every night,   Spends the days with Ashlee Cox, who is sleeping more and more due to sedation from pain meds.   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  Her husband is now at [facility]. She has been to grief counselling and does not need psychiatric counselling.  She is at peace now and managing her husband's decline with much less anxiety.  However, her FMLA application mandates that she have a psychiatric evaluation. .  Sleeping well every night,   Spends the days with Ashlee Cox, who is sleeping more and more due to sedation from pain meds.     Past Medical History  Diagnosis Date  . H/O: rheumatic fever   . Menopause     at age 81  . 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 Cyst, breast     benign    Past Surgical History  Procedure Date  . Appendectomy 2006  . Cholecystectomy 2006  . Vaginal delivery     x3         The following portions of the patient's history were reviewed and updated as appropriate: Allergies, current medications, and problem list.    Review of Systems:  Patient denies headache, fevers, malaise, unintentional weight loss,  skin rash, eye pain, sinus congestion and sinus pain, sore throat, dysphagia,  hemoptysis , cough, dyspnea, wheezing, chest pain, palpitations, orthopnea, edema, abdominal pain, nausea, melena, diarrhea, constipation, flank pain, dysuria, hematuria, urinary  Frequency, nocturia, numbness, tingling, seizures,  Focal weakness, Loss of consciousness,  Tremor, insomnia, depression, anxiety, and suicidal ideation.      History   Social History  . Marital Status: Married    Spouse Name: N/A    Number of Children: N/A  . 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 Smoking status: Former Smoker -- 1 years    Types: Cigarettes    Quit date: 12/26/2005  . Smokeless tobacco: Never Used     Comment: smoked for less than 1 years,  1 cig/day  . Alcohol Use: No  . Drug Use: No  . Sexually Active: Not on file   Other Topics Concern  . Not on file   Social History Narrative  . No narrative on file    Objective:  BP 160/80  Pulse 85  Temp 98.1 F (36.7 C) (Oral)  Resp 16  Wt 194 lb (87.998 kg)  SpO2 98%  General appearance: alert, cooperative and appears stated age Ears: normal TM's and external  ear canals both ears Throat: lips, mucosa, and tongue normal; teeth and gums normal Neck: no adenopathy, no carotid bruit, supple, symmetrical, trachea midline and thyroid not enlarged, symmetric, no tenderness/mass/nodules Back: symmetric, no curvature. ROM normal. No CVA tenderness. Lungs: clear to auscultation bilaterally Heart: regular rate and rhythm, S1, S2 normal, no murmur, click, rub or gallop Abdomen: soft, non-tender; bowel sounds normal; no masses,  no organomegaly Pulses: 2+ and symmetric Skin: Skin color, texture, turgor normal. No rashes or lesions Lymph nodes: Cervical, supraclavicular, and axillary nodes normal.  Assessment and Plan:  Anxiety state Improved with disposition pf husband to Hospice Home.  She is now  sleeping well at night, at home. No psychiatric counselling indicated.  .,   Updated Medication List Outpatient Encounter Prescriptions as of 04/16/2012  Medication Sig Dispense Refill  . ALPRAZolam (XANAX) 0.25 MG tablet Take 1 tablet (0.25 mg total) by mouth 2 (two) times daily as needed for sleep.  60 tablet  3  . aspirin 81 MG tablet Take 81 mg by mouth daily.        . Cholecalciferol (VITAMIN D-3) 1000 UNITS CAPS Take 1,000 Units by mouth daily.      Marland Kitchen losartan (COZAAR) 100 MG tablet Take 1 tablet (100 mg total) by mouth daily.  90 tablet  3  . Multiple Vitamin (MULTIVITAMIN) tablet Take 1 tablet by mouth daily.        Marland Kitchen omeprazole (PRILOSEC) 20 MG capsule Take 1 capsule (20 mg total) by mouth daily.  90 capsule  3  . sertraline (ZOLOFT) 50 MG tablet Take 1 tablet (50 mg total) by mouth daily.  30 tablet  3  . simvastatin (ZOCOR) 40 MG tablet Take 1 tablet (40 mg total) by mouth at bedtime.  90 tablet  3  . traZODone (DESYREL) 50 MG tablet Take 0.5-1 tablets (25-50 mg total) by mouth at bedtime as needed for sleep.  60 tablet  3  . amLODipine (NORVASC) 5 MG tablet Take 1 tablet (5 mg total) by mouth daily.  90 tablet  3     No orders of the defined types were placed in this encounter.    No Follow-up on file.

## 2012-04-17 ENCOUNTER — Encounter: Payer: Self-pay | Admitting: Internal Medicine

## 2012-04-17 NOTE — Assessment & Plan Note (Signed)
Improved with disposition pf husband to Hospice Home.  She is now sleeping well at night, at home. No psychiatric counselling indicated.  Marland Kitchen,

## 2012-05-07 ENCOUNTER — Encounter: Payer: BC Managed Care – PPO | Admitting: Internal Medicine

## 2012-05-28 ENCOUNTER — Other Ambulatory Visit: Payer: BC Managed Care – PPO

## 2012-05-29 ENCOUNTER — Telehealth: Payer: Self-pay | Admitting: *Deleted

## 2012-05-29 NOTE — Telephone Encounter (Signed)
She doesn't need labs, she needs an office visit, per my last note attached to labs in January

## 2012-05-29 NOTE — Telephone Encounter (Signed)
Pt is coming in tomorrow 03.06.2014 for labs, what labs and dx would you like? Thank you

## 2012-05-30 ENCOUNTER — Other Ambulatory Visit (INDEPENDENT_AMBULATORY_CARE_PROVIDER_SITE_OTHER): Payer: BC Managed Care – PPO

## 2012-05-30 ENCOUNTER — Other Ambulatory Visit: Payer: Self-pay | Admitting: General Practice

## 2012-05-30 ENCOUNTER — Other Ambulatory Visit: Payer: Self-pay | Admitting: Internal Medicine

## 2012-05-30 DIAGNOSIS — E785 Hyperlipidemia, unspecified: Secondary | ICD-10-CM

## 2012-05-30 LAB — LIPID PANEL
HDL: 39.7 mg/dL (ref >39.00)
Triglycerides: 141 mg/dL (ref 0.0–149.0)
VLDL: 28.2 mg/dL (ref 0.0–40.0)

## 2012-05-30 MED ORDER — OMEPRAZOLE 20 MG PO CPDR: 20.0000 mg | DELAYED_RELEASE_CAPSULE | Freq: Every day | ORAL | Status: DC

## 2012-05-30 NOTE — Telephone Encounter (Signed)
Med refilled.

## 2012-07-26 ENCOUNTER — Other Ambulatory Visit (HOSPITAL_COMMUNITY)
Admission: RE | Admit: 2012-07-26 | Discharge: 2012-07-26 | Disposition: A | Discharge status: Home / Self Care | Payer: BC Managed Care – PPO | Source: Ambulatory Visit | Attending: Internal Medicine | Admitting: Internal Medicine

## 2012-07-26 ENCOUNTER — Ambulatory Visit (INDEPENDENT_AMBULATORY_CARE_PROVIDER_SITE_OTHER): Payer: BC Managed Care – PPO | Admitting: Internal Medicine

## 2012-07-26 ENCOUNTER — Encounter: Payer: Self-pay | Admitting: Internal Medicine

## 2012-07-26 VITALS — BP 128/76 | HR 82 | Temp 98.2°F | Resp 16 | Ht 67.0 in | Wt 185.0 lb

## 2012-07-26 DIAGNOSIS — I10 Essential (primary) hypertension: Secondary | ICD-10-CM

## 2012-07-26 DIAGNOSIS — Z124 Encounter for screening for malignant neoplasm of cervix: Secondary | ICD-10-CM

## 2012-07-26 DIAGNOSIS — Z1151 Encounter for screening for human papillomavirus (HPV): Secondary | ICD-10-CM | POA: Insufficient documentation

## 2012-07-26 DIAGNOSIS — Z23 Encounter for immunization: Secondary | ICD-10-CM

## 2012-07-26 DIAGNOSIS — Z0000 Encounter for general adult medical examination without abnormal findings: Secondary | ICD-10-CM

## 2012-07-26 DIAGNOSIS — Z1239 Encounter for other screening for malignant neoplasm of breast: Secondary | ICD-10-CM

## 2012-07-26 DIAGNOSIS — E785 Hyperlipidemia, unspecified: Secondary | ICD-10-CM

## 2012-07-26 DIAGNOSIS — Z01419 Encounter for gynecological examination (general) (routine) without abnormal findings: Secondary | ICD-10-CM | POA: Insufficient documentation

## 2012-07-26 DIAGNOSIS — F411 Generalized anxiety disorder: Secondary | ICD-10-CM

## 2012-07-26 DIAGNOSIS — E119 Type 2 diabetes mellitus without complications: Secondary | ICD-10-CM

## 2012-07-26 NOTE — Patient Instructions (Addendum)
You are doing fantastic!!!  I will see you in 6 months,  Sooner if needed  Mammogram to be scheduled  If  Your blood pressure starts to stay below 110/70,  Stop the amlodipine for a week and reassess. (goal is 130/80 or less)

## 2012-07-26 NOTE — Progress Notes (Signed)
Patient ID: Ashlee Cox, female   DOB: 19-Oct-1950, 62 y.o.   MRN: [id number]    Subjective:     Ashlee Cox is a 62 y.o. female and is here for a comprehensive physical exam. The patient reports no problems. She has not been back to work since November 2013.  She is feeling considerably better compared to her last visit.   She does not want to return to third shift.  Her husband died in Hospice in 28-Jun-2022 after a long painful battle with cancer. She lost a total of 22 lbs over his last 6 months of ligfe but has gained a few back.  Meets with [employer] to see if she can afford to retire.    History   Social History  . Marital Status: Married    Spouse Name: N/A    Number of Children: N/A  . 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 Smoking status: Former Smoker -- 1 years    Types: Cigarettes    Quit date: 12/26/2005  . Smokeless tobacco: Never Used     Comment: smoked for less than 1 years,  1 cig/day  . Alcohol Use: No  . Drug Use: No  . Sexually Active: No   Other Topics Concern  . Not on file   Social History Narrative   Widowed, Jun 27, 2012         Health Maintenance  Topic Date Due  . Influenza Vaccine  11/25/2012  . Mammogram  03/19/2013  . Pap Smear  02/17/2014  . Colonoscopy  01/17/2017  . Tetanus/tdap  07/27/2022    The following portions of the patient's history were reviewed and updated as appropriate: allergies, current medications, past family history, past medical history, past social history, past surgical history and problem list.  Review of Systems A comprehensive review of systems was negative.   Objective:   BP 128/76  Pulse 82  Temp(Src) 98.2 F (36.8 C) (Oral)  Resp 16  Ht 5\' 7"  (1.702 m)  Wt 185 lb (83.915 kg)  BMI 28.97 kg/m2  SpO2 98%   General Appearance:    Alert, cooperative, no distress, appears stated age  Head:    Normocephalic, without obvious abnormality,  atraumatic  Eyes:    PERRL, conjunctiva/corneas clear, EOM's intact, fundi    benign, both eyes  Ears:    Normal TM's and external ear canals, both ears  Nose:   Nares normal, septum midline, mucosa normal, no drainage    or sinus tenderness  Throat:   Lips, mucosa, and tongue normal; teeth and gums normal  Neck:   Supple, symmetrical, trachea midline, no adenopathy;    thyroid:  no enlargement/tenderness/nodules; no carotid   bruit or JVD  Back:     Symmetric, no curvature, ROM normal, no CVA tenderness  Lungs:     Clear to auscultation bilaterally, respirations unlabored  Chest Wall:    No tenderness or deformity   Heart:    Regular rate and rhythm, S1 and S2 normal, no murmur, rub   or gallop  Breast Exam:    No tenderness, masses, or nipple abnormality  Abdomen:     Soft, non-tender, bowel sounds active all four quadrants,    no masses, no organomegaly  Genitalia:    Pelvic: cervix normal in appearance, external genitalia normal, no adnexal masses or tenderness, no cervical motion tenderness, rectovaginal septum normal, uterus normal size, shape, and  consistency and vagina normal without discharge  Extremities:   Extremities normal, atraumatic, no cyanosis or edema  Pulses:   2+ and symmetric all extremities  Skin:   Skin color, texture, turgor normal, no rashes or lesions  Lymph nodes:   Cervical, supraclavicular, and axillary nodes normal  Neurologic:   CNII-XII intact, normal strength, sensation and reflexes    throughout   Assessment and Plan:  Routine general medical examination at a health care facility Annual comprehensive exam was done including breast, pelvic and PAP smear. All screenings have been addressed .    Anxiety state Resolved, secondary to husband's battle with cancer.  He passed in March.  She is using zoloft, trazodoen and alprazolam with no escalation in use and no untreated symptoms.  Will consider weanign to off at next follow up in 6  months.  Hyperlipidemia LDL goal < 100 Managed with zocor and ASA  With reduction in LDL from 129 to 57  By March labs.  LFTs normal.  No changes.   Hypertension Well controlled on current regimen of losartan and amlodipine . Renal function stable, no changes today.   Updated Medication List Outpatient Encounter Prescriptions as of 07/26/2012  Medication Sig Dispense Refill  . ALPRAZolam (XANAX) 0.25 MG tablet Take 1 tablet (0.25 mg total) by mouth 2 (two) times daily as needed for sleep.  60 tablet  3  . amLODipine (NORVASC) 5 MG tablet Take 1 tablet (5 mg total) by mouth daily.  90 tablet  3  . aspirin 81 MG tablet Take 81 mg by mouth daily.        . Cholecalciferol (VITAMIN D-3) 1000 UNITS CAPS Take 1,000 Units by mouth daily.      Marland Kitchen losartan (COZAAR) 100 MG tablet Take 1 tablet (100 mg total) by mouth daily.  90 tablet  3  . Multiple Vitamin (MULTIVITAMIN) tablet Take 1 tablet by mouth daily.        Marland Kitchen omeprazole (PRILOSEC) 20 MG capsule Take 1 capsule (20 mg total) by mouth daily.  90 capsule  1  . sertraline (ZOLOFT) 50 MG tablet Take 1 tablet (50 mg total) by mouth daily.  30 tablet  3  . simvastatin (ZOCOR) 40 MG tablet Take 1 tablet (40 mg total) by mouth at bedtime.  90 tablet  3  . traZODone (DESYREL) 50 MG tablet Take 0.5-1 tablets (25-50 mg total) by mouth at bedtime as needed for sleep.  60 tablet  3   No facility-administered encounter medications on file as of 07/26/2012.

## 2012-07-27 ENCOUNTER — Encounter: Payer: Self-pay | Admitting: Internal Medicine

## 2012-07-27 NOTE — Assessment & Plan Note (Signed)
Well controlled on current regimen of losartan and amlodipine . Renal function stable, no changes today.

## 2012-07-27 NOTE — Assessment & Plan Note (Signed)
Annual comprehensive exam was done including breast, pelvic and PAP smear. All screenings have been addressed .  

## 2012-07-27 NOTE — Assessment & Plan Note (Signed)
Managed with zocor and ASA  With reduction in LDL from 129 to 57  By March labs.  LFTs normal.  No changes.

## 2012-07-27 NOTE — Assessment & Plan Note (Addendum)
Resolved, secondary to husband's battle with cancer.  He passed in March.  She is using zoloft, trazodoen and alprazolam with no escalation in use and no untreated symptoms.  Will consider weanign to off at next follow up in 6 months.

## 2012-07-31 ENCOUNTER — Encounter: Payer: Self-pay | Admitting: *Deleted

## 2012-08-01 LAB — HM PAP SMEAR: HM PAP: NORMAL

## 2012-08-07 ENCOUNTER — Other Ambulatory Visit: Payer: Self-pay | Admitting: Internal Medicine

## 2012-08-07 NOTE — Telephone Encounter (Signed)
Please advise as to refill. Last office visit 07/26/12

## 2012-09-06 ENCOUNTER — Other Ambulatory Visit: Payer: Self-pay | Admitting: Internal Medicine

## 2012-11-06 ENCOUNTER — Other Ambulatory Visit: Payer: Self-pay | Admitting: Internal Medicine

## 2012-12-09 ENCOUNTER — Other Ambulatory Visit: Payer: Self-pay | Admitting: Internal Medicine

## 2013-01-28 ENCOUNTER — Encounter: Payer: Self-pay | Admitting: *Deleted

## 2013-01-29 ENCOUNTER — Encounter (INDEPENDENT_AMBULATORY_CARE_PROVIDER_SITE_OTHER): Payer: Self-pay

## 2013-01-29 ENCOUNTER — Encounter: Payer: Self-pay | Admitting: Internal Medicine

## 2013-01-29 ENCOUNTER — Ambulatory Visit (INDEPENDENT_AMBULATORY_CARE_PROVIDER_SITE_OTHER): Payer: BC Managed Care – PPO | Admitting: Internal Medicine

## 2013-01-29 VITALS — BP 134/78 | HR 79 | Temp 98.3°F | Resp 12 | Wt 196.0 lb

## 2013-01-29 DIAGNOSIS — Z23 Encounter for immunization: Secondary | ICD-10-CM

## 2013-01-29 DIAGNOSIS — E669 Obesity, unspecified: Secondary | ICD-10-CM

## 2013-01-29 DIAGNOSIS — F411 Generalized anxiety disorder: Secondary | ICD-10-CM

## 2013-01-29 DIAGNOSIS — I10 Essential (primary) hypertension: Secondary | ICD-10-CM

## 2013-01-29 DIAGNOSIS — E785 Hyperlipidemia, unspecified: Secondary | ICD-10-CM

## 2013-01-29 DIAGNOSIS — E119 Type 2 diabetes mellitus without complications: Secondary | ICD-10-CM

## 2013-01-29 DIAGNOSIS — Z1239 Encounter for other screening for malignant neoplasm of breast: Secondary | ICD-10-CM

## 2013-01-29 LAB — LIPID PANEL
LDL Cholesterol: 96 mg/dL (ref 0–99)
Total CHOL/HDL Ratio: 3
Triglycerides: 116 mg/dL (ref 0.0–149.0)
VLDL: 23.2 mg/dL (ref 0.0–40.0)

## 2013-01-29 LAB — COMPREHENSIVE METABOLIC PANEL
AST: 20 U/L (ref 0–37)
Alkaline Phosphatase: 87 U/L (ref 39–117)
Chloride: 105 mEq/L (ref 96–112)
Glucose, Bld: 124 mg/dL — ABNORMAL HIGH (ref 70–99)
Sodium: 140 mEq/L (ref 135–145)
Total Bilirubin: 0.5 mg/dL (ref 0.3–1.2)
Total Protein: 7.3 g/dL (ref 6.0–8.3)

## 2013-01-29 LAB — MICROALBUMIN / CREATININE URINE RATIO
Creatinine,U: 116.8 mg/dL
Microalb Creat Ratio: 0.7 mg/g (ref 0.0–30.0)
Microalb, Ur: 0.8 mg/dL (ref 0.0–1.9)

## 2013-01-29 LAB — HM DIABETES FOOT EXAM: HM Diabetic Foot Exam: NORMAL

## 2013-01-29 LAB — HM DIABETES EYE EXAM

## 2013-01-29 NOTE — Assessment & Plan Note (Addendum)
I have addressed  BMI and recommended a low glycemic index diet utilizing smaller more frequent meals to increase metabolism.  I have also recommended that patient start exercising with a goal of 30 minutes of aerobic exercise a minimum of 5 days per week. Screening for lipid disorders,diabetes was done today; her fasting glucose was 124 and a1c was 6.4 .  Will return in 3 months for repeat testing .   Lab Results  Component Value Date   HGBA1C 6.4 01/29/2013

## 2013-01-29 NOTE — Patient Instructions (Addendum)
If you want to wean off of zoloft:   1/2 tablet daily for one week,  The 1/2 tablet every other day for one week,  Then stop  This is  my version of a  "Low GI"  Diet:  It will lower your blood sugars and allow you to lose 4 to 8  lbs  per month if you follow it carefully.  Your goal with exercise is a minimum of 30 minutes of aerobic exercise 5 days per week (Walking does not count once it becomes easy!)    All of the foods can be found at grocery stores and in bulk at [employer].  The Atkins protein bars and shakes are available in more varieties at Target, WalMart and [employer]'s Foods.     7 AM Breakfast:  Choose from the following:  Low carbohydrate Protein  Shakes (I recommend the EAS AdvantEdge "Carb Control" shakes  Or the low carb shakes by Atkins.    2.5 carbs   Arnold's "Sandwhich Thin"toasted  w/ peanut butter (no jelly: about 20 net carbs  "Bagel Thin" with cream cheese and salmon: about 20 carbs   a scrambled egg/bacon/cheese burrito made with Mission's "carb balance" whole wheat tortilla  (about 10 net carbs )   Avoid cereal and bananas, oatmeal and cream of wheat and grits. They are loaded with carbohydrates!   10 AM: high protein snack  Protein bar by Atkins (the snack size, under 200 cal, usually < 6 net carbs).    A stick of cheese:  Around 1 carb,  100 cal     Dannon Light n Fit [place] Yogurt  (80 cal, 8 carbs)  Other so called "protein bars" and Greek yogurts tend to be loaded with carbohydrates.  Remember, in food advertising, the word "energy" is synonymous for " carbohydrate."  Lunch:   A Sandwich using the bread choices listed, Can use any  Eggs,  lunchmeat, grilled meat or canned tuna), avocado, regular mayo/mustard  and cheese.  A Salad using blue cheese, ranch,  Goddess or vinagrette,  No croutons or "confetti" and no "candied nuts" but regular nuts OK.   No pretzels or chips.  Pickles and miniature sweet peppers are a good low carb alternative that provide a  "crunch"  The bread is the only source of carbohydrate in a sandwich and  can be decreased by trying some of these alternatives to traditional loaf bread  Joseph's makes a pita bread and a flat bread that are 50 cal and 4 net carbs available at BJs and WalMart.  This can be toasted to use with hummous as well  Toufayan makes a low carb flatbread that's 100 cal and 9 net carbs available at [employer] and [employer] makes 2 sizes of  Low carb whole wheat tortilla  (The large one is 210 cal and 6 net carbs) Avoid "Low fat dressings, as well as Reyne Dumas and [address] dressings They are loaded with sugar!   3 PM/ Mid day  Snack:  Consider  1 ounce of  almonds, walnuts, pistachios, pecans, peanuts,  Macadamia nuts or a nut medley.  Avoid "granola"; the dried cranberries and raisins are loaded with carbohydrates. Mixed nuts as long as there are no raisins,  cranberries or dried fruit.     6 PM  Dinner:     Meat/fowl/fish with a green salad, and either broccoli, cauliflower, green beans, spinach, brussel sprouts or  Lima beans. DO NOT BREAD THE PROTEIN!!  There is a low carb pasta by Dreamfield's that is acceptable and tastes great: only 5 digestible carbs/serving.( All grocery stores but BJs carry it )  Try Hurley Cisco Angelo's chicken piccata or chicken or eggplant parm over low carb pasta.(Lowes and BJs)   Marjory Lies Sanchez's "Carnitas" (pulled pork, no sauce,  0 carbs) or his beef pot roast to make a dinner burrito (at BJ's)  Pesto over low carb pasta (bj's sells a good quality pesto in the center refrigerated section of the deli   Whole wheat pasta is still full of digestible carbs and  Not as low in glycemic index as Dreamfield's.   Brown rice is still rice,  So skip the rice and noodles if you eat [place] or [place] (or at least limit to 1/2 cup)  9 PM snack :   Breyer's "low carb" fudgsicle or  ice cream bar (Carb Smart line), or  Weight Watcher's ice cream bar , or another "no sugar added"  ice cream;  a serving of fresh berries/cherries with whipped cream   Cheese or DANNON'S Ll[place] [place] YOGURT  Avoid bananas, pineapple, grapes  and watermelon on a regular basis because they are high in sugar.  THINK OF THEM AS DESSERT  Remember that snack Substitutions should be less than 10 NET carbs per serving and meals < 20 carbs. Remember to subtract fiber grams to get the "net carbs."

## 2013-01-29 NOTE — Progress Notes (Signed)
Pre-visit discussion using our clinic review tool. No additional management support is needed unless otherwise documented below in the visit note.  

## 2013-01-30 ENCOUNTER — Encounter: Payer: Self-pay | Admitting: Internal Medicine

## 2013-01-30 DIAGNOSIS — E669 Obesity, unspecified: Secondary | ICD-10-CM | POA: Insufficient documentation

## 2013-01-30 DIAGNOSIS — E785 Hyperlipidemia, unspecified: Secondary | ICD-10-CM | POA: Insufficient documentation

## 2013-01-30 DIAGNOSIS — E1159 Type 2 diabetes mellitus with other circulatory complications: Secondary | ICD-10-CM | POA: Insufficient documentation

## 2013-01-30 MED ORDER — LOVASTATIN 40 MG PO TABS: 40.0000 mg | ORAL_TABLET | Freq: Every day | ORAL | Status: DC

## 2013-01-30 NOTE — Assessment & Plan Note (Signed)
I have addressed  BMI and recommended wt loss of 10% of body weigh over the next 6 months using a low glycemic index diet and regular exercise a minimum of 5 days per week.  Return in 3 months .  Foot exam normal,  She is on the appropriate medications.

## 2013-01-30 NOTE — Assessment & Plan Note (Signed)
Brought on by death of husband.  Improved now,  zoloft wean outlined

## 2013-01-30 NOTE — Addendum Note (Signed)
Addended by: Sherlene Shams on: 01/30/2013 06:35 AM   Modules accepted: Orders

## 2013-01-30 NOTE — Progress Notes (Signed)
Patient ID: Ashlee Cox, female   DOB: 03-21-1951, 62 y.o.   MRN: [id number]  Patient Active Problem List   Diagnosis Date Noted  . Type II or unspecified type diabetes mellitus without mention of complication, not stated as uncontrolled 01/30/2013  . Obesity, unspecified 01/29/2013  . Routine general medical examination at a health care facility 07/26/2012  . Anxiety state 01/20/2012  . Abnormal EKG 01/12/2011  . Hypertension 12/27/2010  . Hyperlipidemia LDL goal < 100 12/27/2010  . H/O: rheumatic fever   . History of colonoscopy   . Cyst, breast     Subjective:  CC:   Chief Complaint  Patient presents with  . Follow-up    6 month followup    HPI:   Ashlee Riceis a 62 y.o. female who presents for follow up on chronic conditions including hypertension , hyperlipidemia, generalized anxiety, hyperglycemia and obesity.  After losing her husband in March to [place], she required use of SSRI and allprzolam for management of persistent anxiety .  After several months of counselling she has been able to wean herself off of the alprazolam.  She feel much better, is eating better and unfortunately has gained back 11 lbs.  She is planning to reture by Thanksgiving from [facility] and plans to start a formal [facility] program [facility] [employer] once [facility].   Feels ready to stop the zoloft as well. Tolerating her medications without side effects.    Past Medical History  Diagnosis Date  . H/O: rheumatic fever   . Menopause     at age 49  . 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 Cyst, breast     benign  . Hyperlipidemia   . Hypertension     Past Surgical History  Procedure Laterality Date  . Appendectomy  2006  . Cholecystectomy  2006  . Vaginal delivery      x3       The following portions of the patient's history were reviewed and updated as appropriate: Allergies, current medications, and problem list.    Review of  Systems:   Patient denies headache, fevers, malaise, unintentional weight loss, skin rash, eye pain, sinus congestion and sinus pain, sore throat, dysphagia,  hemoptysis , cough, dyspnea, wheezing, chest pain, palpitations, orthopnea, edema, abdominal pain, nausea, melena, diarrhea, constipation, flank pain, dysuria, hematuria, urinary  Frequency, nocturia, numbness, tingling, seizures,  Focal weakness, Loss of consciousness,  Tremor, insomnia, depression, anxiety, and suicidal ideation.     History   Social History  . Marital Status: Married    Spouse Name: N/A    Number of Children: N/A  . 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 Smoking status: Former Smoker -- 1 years    Types: Cigarettes    Quit date: 12/26/2005  . Smokeless tobacco: Never Used     Comment: smoked for less than 1 years,  1 cig/day  . Alcohol Use: No  . Drug Use: No  . Sexual Activity: No   Other Topics Concern  . Not on file   Social History Narrative   Widowed, March 2014          Objective:  Filed Vitals:   01/29/13 0751  BP: 134/78  Pulse: 79  Temp: 98.3 F (36.8 C)  Resp: 12     General appearance: alert, cooperative and appears stated  age Ears: normal TM's and external ear canals both ears Throat: lips, mucosa, and tongue normal; teeth and gums normal Neck: no adenopathy, no carotid bruit, supple, symmetrical, trachea midline and thyroid not enlarged, symmetric, no tenderness/mass/nodules Back: symmetric, no curvature. ROM normal. No CVA tenderness. Lungs: clear to auscultation bilaterally Heart: regular rate and rhythm, S1, S2 normal, no murmur, click, rub or gallop Abdomen: soft, non-tender; bowel sounds normal; no masses,  no organomegaly Pulses: 2+ and symmetric Skin: Skin color, texture, turgor normal. No rashes or lesions Lymph nodes: Cervical, supraclavicular, and axillary nodes normal. Foot exam:  Nails are well  trimmed,  No callouses,  Sensation intact to microfilament  Assessment and Plan:  Obesity, unspecified I have addressed  BMI and recommended a low glycemic index diet utilizing smaller more frequent meals to increase metabolism.  I have also recommended that patient start exercising with a goal of 30 minutes of aerobic exercise a minimum of 5 days per week. Screening for diabetes was done and confirmed today; her fasting glucose was 124 and a1c was 6.4 .  Will return in 3 months for repeat testing .   Lab Results  Component Value Date   HGBA1C 6.4 01/29/2013    Hypertension Well controlled on current regimen of amlodipine and losartan . Renal function stable, no changes today. Lab Results  Component Value Date   CREATININE 0.8 01/29/2013    Hyperlipidemia LDL goal < 100 LDL and triglycerides are at goal on current medications. She as no side effects and liver enzymes are normal. However, I advised changing statin due to interaction with amlodipine.   Lab Results  Component Value Date   CHOL 179 01/29/2013   HDL 59.50 01/29/2013   LDLCALC 96 01/29/2013   LDLDIRECT 84.8 04/02/2012   TRIG 116.0 01/29/2013   CHOLHDL 3 01/29/2013    Anxiety state Brought on by death of husband.  Improved now,  zoloft wean outlined  Type II or unspecified type diabetes mellitus without mention of complication, not stated as uncontrolled I have addressed  BMI and recommended wt loss of 10% of body weight over the next 6 months using a low glycemic index diet and regular exercise a minimum of 5 days per week.  Return in 3 months .  Foot exam normal,  She is on the appropriate medications.  Eye exam ordered.  A total of 40 minutes was spent with patient more than half of which was spent in counseling, reviewing records and coordination of care.  Updated Medication List Outpatient Encounter Prescriptions as of 01/29/2013  Medication Sig  . amLODipine (NORVASC) 5 MG tablet Take 1 tablet (5 mg total) by mouth  daily.  Marland Kitchen aspirin 81 MG tablet Take 81 mg by mouth daily.    . Cholecalciferol (VITAMIN D-3) 1000 UNITS CAPS Take 1,000 Units by mouth daily.  Marland Kitchen losartan (COZAAR) 100 MG tablet Take 1 tablet (100 mg total) by mouth daily.  . Multiple Vitamin (MULTIVITAMIN) tablet Take 1 tablet by mouth daily.    Marland Kitchen omeprazole (PRILOSEC) 20 MG capsule TAKE ONE CAPSULE BY MOUTH ONCE DAILY  . sertraline (ZOLOFT) 50 MG tablet TAKE ONE TABLET BY MOUTH ONCE DAILY  . [DISCONTINUED] simvastatin (ZOCOR) 40 MG tablet Take 1 tablet (40 mg total) by mouth at bedtime.  . lovastatin (MEVACOR) 40 MG tablet Take 1 tablet (40 mg total) by mouth at bedtime.  . [DISCONTINUED] ALPRAZolam (XANAX) 0.25 MG tablet Take 1 tablet (0.25 mg total) by mouth 2 (two) times daily as  needed for sleep.  . [DISCONTINUED] traZODone (DESYREL) 50 MG tablet Take 0.5-1 tablets (25-50 mg total) by mouth at bedtime as needed for sleep.     Orders Placed This Encounter  Procedures  . MM Digital Screening  . Pneumococcal polysaccharide vaccine 23-valent greater than or equal to 2yo subcutaneous/IM  . HM DIABETES EYE EXAM  . HM DIABETES FOOT EXAM    Return in about 3 months (around 05/01/2013).

## 2013-01-30 NOTE — Assessment & Plan Note (Signed)
LDL and triglycerides are at goal on current medications. She as no side effects and liver enzymes are normal. However, I advised changing statin due to interaction with amlodipine.   Lab Results  Component Value Date   CHOL 179 01/29/2013   HDL 59.50 01/29/2013   LDLCALC 96 01/29/2013   LDLDIRECT 84.8 04/02/2012   TRIG 116.0 01/29/2013   CHOLHDL 3 01/29/2013

## 2013-01-30 NOTE — Assessment & Plan Note (Signed)
Well controlled on current regimen of amlodipine and losartan . Renal function stable, no changes today. Lab Results  Component Value Date   CREATININE 0.8 01/29/2013

## 2013-02-19 ENCOUNTER — Encounter: Payer: Self-pay | Admitting: Emergency Medicine

## 2013-03-31 ENCOUNTER — Ambulatory Visit: Payer: Self-pay | Admitting: Internal Medicine

## 2013-04-12 ENCOUNTER — Other Ambulatory Visit: Payer: Self-pay | Admitting: Internal Medicine

## 2013-04-17 ENCOUNTER — Encounter: Payer: Self-pay | Admitting: Internal Medicine

## 2013-04-21 ENCOUNTER — Other Ambulatory Visit: Payer: Self-pay | Admitting: Internal Medicine

## 2013-04-29 ENCOUNTER — Other Ambulatory Visit (INDEPENDENT_AMBULATORY_CARE_PROVIDER_SITE_OTHER): Payer: BC Managed Care – PPO

## 2013-04-29 DIAGNOSIS — E119 Type 2 diabetes mellitus without complications: Secondary | ICD-10-CM

## 2013-04-29 DIAGNOSIS — E669 Obesity, unspecified: Secondary | ICD-10-CM

## 2013-04-29 LAB — COMPREHENSIVE METABOLIC PANEL
ALBUMIN: 4 g/dL (ref 3.5–5.2)
ALK PHOS: 90 U/L (ref 39–117)
ALT: 18 U/L (ref 0–35)
AST: 19 U/L (ref 0–37)
BUN: 21 mg/dL (ref 6–23)
CALCIUM: 9.8 mg/dL (ref 8.4–10.5)
CO2: 28 mEq/L (ref 19–32)
Chloride: 108 mEq/L (ref 96–112)
Creatinine, Ser: 0.8 mg/dL (ref 0.4–1.2)
GFR: 75.45 mL/min (ref >60.00)
GLUCOSE: 127 mg/dL — AB (ref 70–99)
POTASSIUM: 4.6 meq/L (ref 3.5–5.1)
Sodium: 142 mEq/L (ref 135–145)
Total Bilirubin: 0.7 mg/dL (ref 0.3–1.2)
Total Protein: 7.3 g/dL (ref 6.0–8.3)

## 2013-04-29 LAB — LIPID PANEL
CHOL/HDL RATIO: 3
CHOLESTEROL: 162 mg/dL (ref 0–200)
HDL: 46.3 mg/dL (ref >39.00)
LDL CALC: 87 mg/dL (ref 0–99)
TRIGLYCERIDES: 145 mg/dL (ref 0.0–149.0)
VLDL: 29 mg/dL (ref 0.0–40.0)

## 2013-04-29 LAB — HEMOGLOBIN A1C: Hgb A1c MFr Bld: 6.5 % (ref 4.6–6.5)

## 2013-04-29 LAB — TSH: TSH: 1.64 u[IU]/mL (ref 0.35–5.50)

## 2013-05-01 ENCOUNTER — Encounter: Payer: Self-pay | Admitting: *Deleted

## 2013-05-01 ENCOUNTER — Ambulatory Visit: Payer: BC Managed Care – PPO | Admitting: Internal Medicine

## 2013-05-06 ENCOUNTER — Encounter (INDEPENDENT_AMBULATORY_CARE_PROVIDER_SITE_OTHER): Payer: Self-pay

## 2013-05-06 ENCOUNTER — Ambulatory Visit (INDEPENDENT_AMBULATORY_CARE_PROVIDER_SITE_OTHER): Payer: BC Managed Care – PPO | Admitting: Internal Medicine

## 2013-05-06 ENCOUNTER — Encounter: Payer: Self-pay | Admitting: Internal Medicine

## 2013-05-06 VITALS — BP 134/74 | HR 93 | Temp 98.6°F | Resp 18 | Wt 192.2 lb

## 2013-05-06 DIAGNOSIS — E669 Obesity, unspecified: Secondary | ICD-10-CM

## 2013-05-06 DIAGNOSIS — E785 Hyperlipidemia, unspecified: Secondary | ICD-10-CM

## 2013-05-06 DIAGNOSIS — I10 Essential (primary) hypertension: Secondary | ICD-10-CM

## 2013-05-06 DIAGNOSIS — E119 Type 2 diabetes mellitus without complications: Secondary | ICD-10-CM

## 2013-05-06 LAB — HM DIABETES FOOT EXAM: HM DIABETIC FOOT EXAM: NORMAL

## 2013-05-06 NOTE — Progress Notes (Signed)
Pre-visit discussion using our clinic review tool. No additional management support is needed unless otherwise documented below in the visit note.  

## 2013-05-06 NOTE — Progress Notes (Signed)
Patient ID: Ashlee Cox, female   DOB: 30-Apr-1950, 63 y.o.   MRN: [id number]   Patient Active Problem List   Diagnosis Date Noted  . Type II or unspecified type diabetes mellitus without mention of complication, not stated as uncontrolled 01/30/2013  . Obesity, unspecified 01/29/2013  . Routine general medical examination at a health care facility 07/26/2012  . Anxiety state 01/20/2012  . Abnormal EKG 01/12/2011  . Hypertension 12/27/2010  . Hyperlipidemia LDL goal < 100 12/27/2010  . H/O: rheumatic fever   . History of colonoscopy   . Cyst, breast     Subjective:  CC:   Chief Complaint  Patient presents with  . Follow-up    3 month    HPI:   Ashlee Cox is a 63 y.o. female who presents for follow up on chronic conditions including diet controlled DM, hypertension, hyperlipidemia and grief from loss of spouse.   She weaned off sertraline since her last visit without recurrence of depressive or anxious symptoms.  Tolerating all meds without side effects.  Sleeping well.    She has retired from [employer] and is exercising regularly  At [employer] .   Fasting bllod sugars are checked occasionally and all are under 140.    Past Medical History  Diagnosis Date  . H/O: rheumatic fever   . Menopause     at age 42  . 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 Cyst, breast     benign  . Hyperlipidemia   . Hypertension     Past Surgical History  Procedure Laterality Date  . Appendectomy  2006  . Cholecystectomy  2006  . Vaginal delivery      x3       The following portions of the patient's history were reviewed and updated as appropriate: Allergies, current medications, and problem list.    Review of Systems:   Patient denies headache, fevers, malaise, unintentional weight loss, skin rash, eye pain, sinus congestion and sinus pain, sore throat, dysphagia,  hemoptysis , cough, dyspnea, wheezing, chest pain, palpitations,  orthopnea, edema, abdominal pain, nausea, melena, diarrhea, constipation, flank pain, dysuria, hematuria, urinary  Frequency, nocturia, numbness, tingling, seizures,  Focal weakness, Loss of consciousness,  Tremor, insomnia, depression, anxiety, and suicidal ideation.     History   Social History  . Marital Status: Married    Spouse Name: N/A    Number of Children: N/A  . 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 Smoking status: Former Smoker -- 1 years    Types: Cigarettes    Quit date: 12/26/2005  . Smokeless tobacco: Never Used     Comment: smoked for less than 1 years,  1 cig/day  . Alcohol Use: No  . Drug Use: No  . Sexual Activity: No   Other Topics Concern  . Not on file   Social History Narrative   Widowed, March 2014          Objective:  Filed Vitals:   05/06/13 1644  BP: 134/74  Pulse: 93  Temp: 98.6 F (37 C)  Resp: 18     General appearance: alert, cooperative and appears stated age Ears: normal TM's and external ear canals both ears Throat: lips, mucosa, and tongue normal; teeth and gums normal Neck: no adenopathy, no carotid bruit, supple, symmetrical, trachea midline and thyroid  not enlarged, symmetric, no tenderness/mass/nodules Back: symmetric, no curvature. ROM normal. No CVA tenderness. Lungs: clear to auscultation bilaterally Heart: regular rate and rhythm, S1, S2 normal, no murmur, click, rub or gallop Abdomen: soft, non-tender; bowel sounds normal; no masses,  no organomegaly Pulses: 2+ and symmetric Skin: Skin color, texture, turgor normal. No rashes or lesions Lymph nodes: Cervical, supraclavicular, and axillary nodes normal.  Assessment and Plan:  Obesity, unspecified I have congratulated her in the loss of 4 lbs since last visit  and encouraged  Continued weight loss with goal of 10% of body weigh over the next 6 months using a low glycemic index diet and regular exercise a  minimum of 5 days per week.    Hypertension Well controlled on current regimen. Renal function stable, no changes today.  Lab Results  Component Value Date   CREATININE 0.8 04/29/2013     Type II or unspecified type diabetes mellitus without mention of complication, not stated as uncontrolled Well-controlled on current regiemn.  hemoglobin A1c has been consistently less than 7.0 . SHe is up-to-date on eye exams and his foot exam is normal. l we'll repeat his urine microalbumin to creatinine ratio at next visit. He is on the appropriate medications.  Lab Results  Component Value Date   HGBA1C 6.5 04/29/2013   Lab Results  Component Value Date   MICROALBUR 0.8 01/29/2013    Hyperlipidemia LDL goal < 100 Well controlled on current statin therapy.   Liver enzymes are normal , no changes today.  Lab Results  Component Value Date   CHOL 162 04/29/2013   HDL 46.30 04/29/2013   LDLCALC 87 04/29/2013   LDLDIRECT 84.8 04/02/2012   TRIG 145.0 04/29/2013   CHOLHDL 3 04/29/2013   Lab Results  Component Value Date   ALT 18 04/29/2013   AST 19 04/29/2013   ALKPHOS 90 04/29/2013   BILITOT 0.7 04/29/2013      Updated Medication List Outpatient Encounter Prescriptions as of 05/06/2013  Medication Sig  . amLODipine (NORVASC) 5 MG tablet TAKE ONE TABLET BY MOUTH EVERY DAY  . aspirin 81 MG tablet Take 81 mg by mouth daily.    . Cholecalciferol (VITAMIN D-3) 1000 UNITS CAPS Take 1,000 Units by mouth daily.  Nyoka Cowden Tea, Camillia sinensis, (GREEN TEA PO) Take 2 capsules by mouth 2 (two) times daily.  Marland Kitchen losartan (COZAAR) 100 MG tablet TAKE ONE TABLET BY MOUTH EVERY DAY  . lovastatin (MEVACOR) 40 MG tablet Take 1 tablet (40 mg total) by mouth at bedtime.  . Multiple Vitamin (MULTIVITAMIN) tablet Take 1 tablet by mouth daily.    Marland Kitchen omeprazole (PRILOSEC) 20 MG capsule TAKE ONE CAPSULE BY MOUTH ONCE DAILY  . sertraline (ZOLOFT) 50 MG tablet TAKE ONE TABLET BY MOUTH ONCE DAILY     Orders Placed This Encounter   Procedures  . HM DIABETES FOOT EXAM    No Follow-up on file.

## 2013-05-08 ENCOUNTER — Encounter: Payer: Self-pay | Admitting: Internal Medicine

## 2013-05-08 NOTE — Assessment & Plan Note (Signed)
Well controlled on current regimen. Renal function stable, no changes today.  Lab Results  Component Value Date   CREATININE 0.8 04/29/2013

## 2013-05-08 NOTE — Assessment & Plan Note (Addendum)
Well-controlled on current regiemn.  hemoglobin A1c has been consistently less than 7.0 . SHe is up-to-date on eye exams and his foot exam is normal. l we'll repeat his urine microalbumin to creatinine ratio at next visit. He is on the appropriate medications.  Lab Results  Component Value Date   HGBA1C 6.5 04/29/2013   Lab Results  Component Value Date   MICROALBUR 0.8 01/29/2013

## 2013-05-08 NOTE — Assessment & Plan Note (Signed)
I have congratulated her in the loss of 4 lbs since last visit  and encouraged  Continued weight loss with goal of 10% of body weigh over the next 6 months using a low glycemic index diet and regular exercise a minimum of 5 days per week.

## 2013-05-08 NOTE — Assessment & Plan Note (Signed)
Well controlled on current statin therapy.   Liver enzymes are normal , no changes today.  Lab Results  Component Value Date   CHOL 162 04/29/2013   HDL 46.30 04/29/2013   LDLCALC 87 04/29/2013   LDLDIRECT 84.8 04/02/2012   TRIG 145.0 04/29/2013   CHOLHDL 3 04/29/2013   Lab Results  Component Value Date   ALT 18 04/29/2013   AST 19 04/29/2013   ALKPHOS 90 04/29/2013   BILITOT 0.7 04/29/2013

## 2013-07-16 ENCOUNTER — Other Ambulatory Visit: Payer: Self-pay | Admitting: Internal Medicine

## 2013-10-16 ENCOUNTER — Other Ambulatory Visit: Payer: Self-pay | Admitting: Internal Medicine

## 2014-01-13 ENCOUNTER — Other Ambulatory Visit: Payer: Self-pay | Admitting: Internal Medicine

## 2014-01-26 ENCOUNTER — Other Ambulatory Visit: Payer: Self-pay | Admitting: Internal Medicine

## 2014-01-26 ENCOUNTER — Encounter: Payer: Self-pay | Admitting: Internal Medicine

## 2014-02-12 ENCOUNTER — Other Ambulatory Visit: Payer: Self-pay | Admitting: Internal Medicine

## 2014-02-18 ENCOUNTER — Telehealth: Payer: Self-pay | Admitting: Internal Medicine

## 2014-03-02 ENCOUNTER — Other Ambulatory Visit: Payer: Self-pay | Admitting: Internal Medicine

## 2014-03-17 ENCOUNTER — Other Ambulatory Visit: Payer: Self-pay | Admitting: Internal Medicine

## 2014-03-17 NOTE — Telephone Encounter (Signed)
Appt 04/06/14

## 2014-04-06 ENCOUNTER — Ambulatory Visit: Payer: BC Managed Care – PPO | Admitting: Internal Medicine

## 2014-04-23 ENCOUNTER — Other Ambulatory Visit: Payer: Self-pay | Admitting: Internal Medicine

## 2014-04-24 ENCOUNTER — Other Ambulatory Visit: Payer: Self-pay | Admitting: Internal Medicine

## 2014-04-27 LAB — HM DIABETES FOOT EXAM: HM DIABETIC FOOT EXAM: NORMAL

## 2014-05-14 ENCOUNTER — Other Ambulatory Visit: Payer: Self-pay | Admitting: Internal Medicine

## 2014-05-26 ENCOUNTER — Ambulatory Visit (INDEPENDENT_AMBULATORY_CARE_PROVIDER_SITE_OTHER): Payer: BLUE CROSS/BLUE SHIELD | Admitting: Internal Medicine

## 2014-05-26 ENCOUNTER — Encounter: Payer: Self-pay | Admitting: Internal Medicine

## 2014-05-26 VITALS — BP 124/64 | HR 83 | Temp 98.6°F | Resp 16 | Ht 67.0 in | Wt 191.2 lb

## 2014-05-26 DIAGNOSIS — E669 Obesity, unspecified: Secondary | ICD-10-CM

## 2014-05-26 DIAGNOSIS — I10 Essential (primary) hypertension: Secondary | ICD-10-CM

## 2014-05-26 DIAGNOSIS — E1169 Type 2 diabetes mellitus with other specified complication: Secondary | ICD-10-CM

## 2014-05-26 DIAGNOSIS — E785 Hyperlipidemia, unspecified: Secondary | ICD-10-CM

## 2014-05-26 DIAGNOSIS — E1159 Type 2 diabetes mellitus with other circulatory complications: Secondary | ICD-10-CM

## 2014-05-26 DIAGNOSIS — E119 Type 2 diabetes mellitus without complications: Secondary | ICD-10-CM | POA: Diagnosis not present

## 2014-05-26 MED ORDER — OMEPRAZOLE 20 MG PO CPDR: 20.0000 mg | DELAYED_RELEASE_CAPSULE | Freq: Every day | ORAL | Status: DC

## 2014-05-26 MED ORDER — LOVASTATIN 40 MG PO TABS: ORAL_TABLET | ORAL | Status: DC

## 2014-05-26 NOTE — Patient Instructions (Addendum)
Stop the amlodipine !  Continue the losartan for now.  If BP still drops below 120/70, regularly,  We will reduce dose to 50 mg daily  You are NOT due for the pneumonia vaccine yet.  Diabetes and Exercise Exercising regularly is important. It is not just about losing weight. It has many health benefits, such as:  Improving your overall fitness, flexibility, and endurance.  Increasing your bone density.  Helping with weight control.  Decreasing your body fat.  Increasing your muscle strength.  Reducing stress and tension.  Improving your overall health. People with diabetes who exercise gain additional benefits because exercise:  Reduces appetite.  Improves the body's use of blood sugar (glucose).  Helps lower or control blood glucose.  Decreases blood pressure.  Helps control blood lipids (such as cholesterol and triglycerides).  Improves the body's use of the hormone insulin by:  Increasing the body's insulin sensitivity.  Reducing the body's insulin needs.  Decreases the risk for heart disease because exercising:  Lowers cholesterol and triglycerides levels.  Increases the levels of good cholesterol (such as high-density lipoproteins [HDL]) in the body.  Lowers blood glucose levels. YOUR ACTIVITY PLAN  Choose an activity that you enjoy and set realistic goals. Your health care provider or diabetes educator can help you make an activity plan that works for you. Exercise regularly as directed by your health care provider. This includes:  Performing resistance training twice a week such as push-ups, sit-ups, lifting weights, or using resistance bands.  Performing 150 minutes of cardio exercises each week such as walking, running, or playing sports.  Staying active and spending no more than 90 minutes at one time being inactive. Even short bursts of exercise are good for you. Three 10-minute sessions spread throughout the day are just as beneficial as a single  30-minute session. Some exercise ideas include:  Taking the dog for a walk.  Taking the stairs instead of the elevator.  Dancing to your favorite song.  Doing an exercise video.  Doing your favorite exercise with a friend. RECOMMENDATIONS FOR EXERCISING WITH TYPE 1 OR TYPE 2 DIABETES   Check your blood glucose before exercising. If blood glucose levels are greater than 240 mg/dL, check for urine ketones. Do not exercise if ketones are present.  Avoid injecting insulin into areas of the body that are going to be exercised. For example, avoid injecting insulin into:  The arms when playing tennis.  The legs when jogging.  Keep a record of:  Food intake before and after you exercise.  Expected peak times of insulin action.  Blood glucose levels before and after you exercise.  The type and amount of exercise you have done.  Review your records with your health care provider. Your health care provider will help you to develop guidelines for adjusting food intake and insulin amounts before and after exercising.  If you take insulin or oral hypoglycemic agents, watch for signs and symptoms of hypoglycemia. They include:  Dizziness.  Shaking.  Sweating.  Chills.  Confusion.  Drink plenty of water while you exercise to prevent dehydration or heat stroke. Body water is lost during exercise and must be replaced.  Talk to your health care provider before starting an exercise program to make sure it is safe for you. Remember, almost any type of activity is better than none. Document Released: 06/03/2003 Document Revised: 07/28/2013 Document Reviewed: 08/20/2012 [facility] Patient Information 2015 [place], [place]. This information is not intended to replace advice given to you by  your health care provider. Make sure you discuss any questions you have with your health care provider.  

## 2014-05-26 NOTE — Progress Notes (Signed)
Patient ID: Ashlee Cox, female   DOB: [birth year], 64 y.o.   MRN: [id number]  Patient Active Problem List   Diagnosis Date Noted  . Obesity, diabetes, and hypertension syndrome 01/30/2013  . Obesity, unspecified 01/29/2013  . Routine general medical examination at a health care facility 07/26/2012  . Anxiety state 01/20/2012  . Abnormal EKG 01/12/2011  . Hypertension 12/27/2010  . Hyperlipidemia with target LDL less than 100 12/27/2010  . H/O: rheumatic fever   . History of colonoscopy   . Cyst, breast     Subjective:  CC:   Chief Complaint  Patient presents with  . Follow-up    medication refill, Patient concerned about low BP.    HPI:   Ashlee Cox is a 64 y.o. female who presents for  Follow up on type 2 DM, diet controlled with obesity, hyperlipidemia  and hypertension.  She was  last seen Feb 2015. Has been intentionally trying to lose weight using the weight watchers program since mid December and has  15 lbs. She has been having episodes of hypotension and has stopped her amlodipine but has continued taking losartan and lovastatin.      Patient has no complaints today.  Patient is following a low glycemic index dietFasting sugars have been under less than 140 most of the time and post prandials have been under 160 except on rare occasions. Patient is exercising about 3 times per week and intentionally trying to lose weight .  Patient has had an eye exam in the last 12 months and checks feet regularly for signs of infection.  Patient does not walk barefoot outside,  And denies an numbness tingling or burning in feet. Patient is up to date on all recommended vaccinations     Past Medical History  Diagnosis Date  . H/O: rheumatic fever   . Menopause     at age 68  . 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 Cyst, breast     benign  . Hyperlipidemia   . Hypertension     Past Surgical History  Procedure  Laterality Date  . Appendectomy  2006  . Cholecystectomy  2006  . Vaginal delivery      x3       The following portions of the patient's history were reviewed and updated as appropriate: Allergies, current medications, and problem list.    Review of Systems:   Patient denies headache, fevers, malaise, unintentional weight loss, skin rash, eye pain, sinus congestion and sinus pain, sore throat, dysphagia,  hemoptysis , cough, dyspnea, wheezing, chest pain, palpitations, orthopnea, edema, abdominal pain, nausea, melena, diarrhea, constipation, flank pain, dysuria, hematuria, urinary  Frequency, nocturia, numbness, tingling, seizures,  Focal weakness, Loss of consciousness,  Tremor, insomnia, depression, anxiety, and suicidal ideation.     History   Social History  . Marital Status: Widowed    Spouse Name: N/A  . Number of Children: N/A  . 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 Smoking status: Former Smoker -- 1 years    Types: Cigarettes    Quit date: 12/26/2005  . Smokeless tobacco: Never Used     Comment: smoked for less than 1 years,  1 cig/day  . Alcohol Use: No  . Drug Use: No  . Sexual Activity: No   Other Topics Concern  . Not  on file   Social History Narrative   Widowed, March 2014          Objective:  Filed Vitals:   05/26/14 1524  BP: 124/64  Pulse: 83  Temp: 98.6 F (37 C)  Resp: 16     General appearance: alert, cooperative and appears stated age Ears: normal TM's and external ear canals both ears Throat: lips, mucosa, and tongue normal; teeth and gums normal Neck: no adenopathy, no carotid bruit, supple, symmetrical, trachea midline and thyroid not enlarged, symmetric, no tenderness/mass/nodules Back: symmetric, no curvature. ROM normal. No CVA tenderness. Lungs: clear to auscultation bilaterally Heart: regular rate and rhythm, S1, S2 normal, no murmur, click, rub or  gallop Abdomen: soft, non-tender; bowel sounds normal; no masses,  no organomegaly Pulses: 2+ and symmetric Skin: Skin color, texture, turgor normal. No rashes or lesions Lymph nodes: Cervical, supraclavicular, and axillary nodes normal.  Assessment and Plan:  Hypertension Well controlled , with recurrent hypotension on current regimen. Agree with stopping amlodipine and continuing losartan .  renal function stable, no changes today.  Lab Results  Component Value Date   CREATININE 0.90 05/26/2014   Lab Results  Component Value Date   NA 140 05/26/2014   K 4.3 05/26/2014   CL 106 05/26/2014   CO2 26 05/26/2014      Obesity, diabetes, and hypertension syndrome  well-controlled on diet alone .  hemoglobin A1c has been consistently at or  less than 7.0 . Patient is up-to-date on eye exams and foot exam is normal today. Patient has minimal microalbuminuria ,  is tolerating statin therapy for CAD risk reduction and on ACE/ARB for reduction in proteinuria.  I have congratulated her in reduction of   BMI and encouraged  Continued weight loss with goal of 10% of body weigh over the next 6 months using a low glycemic index diet and adding participation in regular exercise a minimum of 5 days per week.   Lab Results  Component Value Date   HGBA1C 6.7* 05/26/2014   Lab Results  Component Value Date   MICROALBUR 3.2* 05/26/2014      Hyperlipidemia with target LDL less than 100 Well controlled on current statin therapy.   She was not fasting. Liver enzymes are normal , no changes today.  Lab Results  Component Value Date   CHOL 159 05/26/2014   HDL 39.90 05/26/2014   LDLCALC 87 04/29/2013   LDLDIRECT 82.0 05/26/2014   TRIG 299.0* 05/26/2014   CHOLHDL 4 05/26/2014   Lab Results  Component Value Date   ALT 18 05/26/2014   AST 19 05/26/2014   ALKPHOS 93 05/26/2014   BILITOT 0.5 05/26/2014      A total of 25 minutes of face to face time was spent with patient more than half  of which was spent in counselling on the above mentioned issues.  Updated Medication List Outpatient Encounter Prescriptions as of 05/26/2014  Medication Sig  . aspirin 81 MG tablet Take 81 mg by mouth daily.    . Cholecalciferol (VITAMIN D-3) 1000 UNITS CAPS Take 1,000 Units by mouth daily.  Marland Kitchen losartan (COZAAR) 100 MG tablet TAKE ONE TABLET BY MOUTH ONCE DAILY  . lovastatin (MEVACOR) 40 MG tablet TAKE ONE TABLET BY MOUTH AT BEDTIME  . Multiple Vitamin (MULTIVITAMIN) tablet Take 1 tablet by mouth daily.    Marland Kitchen omeprazole (PRILOSEC) 20 MG capsule Take 1 capsule (20 mg total) by mouth daily.  . [DISCONTINUED] amLODipine (NORVASC) 5 MG tablet TAKE ONE  TABLET BY MOUTH ONCE DAILY  . [DISCONTINUED] lovastatin (MEVACOR) 40 MG tablet TAKE ONE TABLET BY MOUTH AT BEDTIME **NEED  OFFICE  VISIT  AND  LABS  PRIOR  TO  FURTHER  REFILLS**  . [DISCONTINUED] omeprazole (PRILOSEC) 20 MG capsule TAKE ONE CAPSULE BY MOUTH ONCE DAILY  . Green Tea, Camillia sinensis, (GREEN TEA PO) Take 2 capsules by mouth 2 (two) times daily.     Orders Placed This Encounter  Procedures  . Comprehensive metabolic panel  . Hemoglobin A1c  . Lipid panel  . Microalbumin / creatinine urine ratio  . LDL cholesterol, direct  . HM DIABETES FOOT EXAM    Return in about 6 months (around 11/26/2014) for follow up diabetes.

## 2014-05-26 NOTE — Progress Notes (Signed)
Pre-visit discussion using our clinic review tool. No additional management support is needed unless otherwise documented below in the visit note.  

## 2014-05-27 LAB — COMPREHENSIVE METABOLIC PANEL
ALK PHOS: 93 U/L (ref 39–117)
ALT: 18 U/L (ref 0–35)
AST: 19 U/L (ref 0–37)
Albumin: 4.5 g/dL (ref 3.5–5.2)
BUN: 19 mg/dL (ref 6–23)
CO2: 26 mEq/L (ref 19–32)
Calcium: 10 mg/dL (ref 8.4–10.5)
Chloride: 106 mEq/L (ref 96–112)
Creatinine, Ser: 0.9 mg/dL (ref 0.40–1.20)
GFR: 67.53 mL/min (ref >60.00)
GLUCOSE: 145 mg/dL — AB (ref 70–99)
Potassium: 4.3 mEq/L (ref 3.5–5.1)
SODIUM: 140 meq/L (ref 135–145)
TOTAL PROTEIN: 7.7 g/dL (ref 6.0–8.3)
Total Bilirubin: 0.5 mg/dL (ref 0.2–1.2)

## 2014-05-27 LAB — LIPID PANEL
Cholesterol: 159 mg/dL (ref 0–200)
HDL: 39.9 mg/dL (ref >39.00)
NonHDL: 119.1
Total CHOL/HDL Ratio: 4
Triglycerides: 299 mg/dL — ABNORMAL HIGH (ref 0.0–149.0)
VLDL: 59.8 mg/dL — ABNORMAL HIGH (ref 0.0–40.0)

## 2014-05-27 LAB — MICROALBUMIN / CREATININE URINE RATIO
CREATININE, U: 269.9 mg/dL
Microalb Creat Ratio: 1.2 mg/g (ref 0.0–30.0)
Microalb, Ur: 3.2 mg/dL — ABNORMAL HIGH (ref 0.0–1.9)

## 2014-05-27 LAB — LDL CHOLESTEROL, DIRECT: Direct LDL: 82 mg/dL

## 2014-05-27 LAB — HEMOGLOBIN A1C: HEMOGLOBIN A1C: 6.7 % — AB (ref 4.6–6.5)

## 2014-05-28 ENCOUNTER — Encounter: Payer: Self-pay | Admitting: Internal Medicine

## 2014-05-28 NOTE — Assessment & Plan Note (Signed)
Well controlled , with recurrent hypotension on current regimen. Agree with stopping amlodipine and continuing losartan .  renal function stable, no changes today.  Lab Results  Component Value Date   CREATININE 0.90 05/26/2014   Lab Results  Component Value Date   NA 140 05/26/2014   K 4.3 05/26/2014   CL 106 05/26/2014   CO2 26 05/26/2014

## 2014-05-28 NOTE — Assessment & Plan Note (Addendum)
well-controlled on diet alone .  hemoglobin A1c has been consistently at or  less than 7.0 . Patient is up-to-date on eye exams and foot exam is normal today. Patient has minimal microalbuminuria ,  is tolerating statin therapy for CAD risk reduction and on ACE/ARB for reduction in proteinuria.  I have congratulated her in reduction of   BMI and encouraged  Continued weight loss with goal of 10% of body weigh over the next 6 months using a low glycemic index diet and adding participation in regular exercise a minimum of 5 days per week.   Lab Results  Component Value Date   HGBA1C 6.7* 05/26/2014   Lab Results  Component Value Date   MICROALBUR 3.2* 05/26/2014

## 2014-05-28 NOTE — Assessment & Plan Note (Signed)
Well controlled on current statin therapy.   She was not fasting. Liver enzymes are normal , no changes today.  Lab Results  Component Value Date   CHOL 159 05/26/2014   HDL 39.90 05/26/2014   LDLCALC 87 04/29/2013   LDLDIRECT 82.0 05/26/2014   TRIG 299.0* 05/26/2014   CHOLHDL 4 05/26/2014   Lab Results  Component Value Date   ALT 18 05/26/2014   AST 19 05/26/2014   ALKPHOS 93 05/26/2014   BILITOT 0.5 05/26/2014

## 2014-05-29 ENCOUNTER — Encounter: Payer: Self-pay | Admitting: *Deleted

## 2014-07-23 ENCOUNTER — Other Ambulatory Visit: Payer: Self-pay | Admitting: Internal Medicine

## 2014-12-03 ENCOUNTER — Ambulatory Visit (INDEPENDENT_AMBULATORY_CARE_PROVIDER_SITE_OTHER): Payer: BLUE CROSS/BLUE SHIELD | Admitting: Internal Medicine

## 2014-12-03 ENCOUNTER — Encounter: Payer: Self-pay | Admitting: Internal Medicine

## 2014-12-03 VITALS — BP 148/80 | HR 74 | Temp 98.3°F | Resp 14 | Ht 66.75 in | Wt 199.8 lb

## 2014-12-03 DIAGNOSIS — Z113 Encounter for screening for infections with a predominantly sexual mode of transmission: Secondary | ICD-10-CM | POA: Diagnosis not present

## 2014-12-03 DIAGNOSIS — Z23 Encounter for immunization: Secondary | ICD-10-CM

## 2014-12-03 DIAGNOSIS — Z1239 Encounter for other screening for malignant neoplasm of breast: Secondary | ICD-10-CM

## 2014-12-03 DIAGNOSIS — E785 Hyperlipidemia, unspecified: Secondary | ICD-10-CM

## 2014-12-03 DIAGNOSIS — E559 Vitamin D deficiency, unspecified: Secondary | ICD-10-CM | POA: Diagnosis not present

## 2014-12-03 DIAGNOSIS — E669 Obesity, unspecified: Secondary | ICD-10-CM

## 2014-12-03 DIAGNOSIS — Z0000 Encounter for general adult medical examination without abnormal findings: Secondary | ICD-10-CM

## 2014-12-03 DIAGNOSIS — E1121 Type 2 diabetes mellitus with diabetic nephropathy: Secondary | ICD-10-CM | POA: Diagnosis not present

## 2014-12-03 DIAGNOSIS — I10 Essential (primary) hypertension: Secondary | ICD-10-CM

## 2014-12-03 LAB — COMPREHENSIVE METABOLIC PANEL
ALT: 18 U/L (ref 0–35)
AST: 16 U/L (ref 0–37)
Albumin: 4.2 g/dL (ref 3.5–5.2)
Alkaline Phosphatase: 100 U/L (ref 39–117)
BILIRUBIN TOTAL: 0.4 mg/dL (ref 0.2–1.2)
BUN: 19 mg/dL (ref 6–23)
CO2: 28 mEq/L (ref 19–32)
CREATININE: 0.79 mg/dL (ref 0.40–1.20)
Calcium: 9.8 mg/dL (ref 8.4–10.5)
Chloride: 105 mEq/L (ref 96–112)
GFR: 78.36 mL/min (ref >60.00)
GLUCOSE: 130 mg/dL — AB (ref 70–99)
Potassium: 4.6 mEq/L (ref 3.5–5.1)
Sodium: 141 mEq/L (ref 135–145)
Total Protein: 7.1 g/dL (ref 6.0–8.3)

## 2014-12-03 LAB — VITAMIN D 25 HYDROXY (VIT D DEFICIENCY, FRACTURES): VITD: 36.57 ng/mL (ref 30.00–100.00)

## 2014-12-03 LAB — LIPID PANEL
CHOL/HDL RATIO: 4
Cholesterol: 180 mg/dL (ref 0–200)
HDL: 46.1 mg/dL (ref >39.00)
NONHDL: 134.13
Triglycerides: 216 mg/dL — ABNORMAL HIGH (ref 0.0–149.0)
VLDL: 43.2 mg/dL — AB (ref 0.0–40.0)

## 2014-12-03 LAB — LDL CHOLESTEROL, DIRECT: LDL DIRECT: 99 mg/dL

## 2014-12-03 NOTE — Progress Notes (Signed)
Pre-visit discussion using our clinic review tool. No additional management support is needed unless otherwise documented below in the visit note.  

## 2014-12-03 NOTE — Patient Instructions (Addendum)
You received the influenza vaccine today  Please lose 10 lbs by your next visit goal weight < 190 lbs  Please check BP 5 times over the next month and send me the readings,  Goal is < 130/80  3 D mammogram is due in January 2017  Health Maintenance Adopting a healthy lifestyle and getting preventive care can go a long way to promote health and wellness. Talk with your health care provider about what schedule of regular examinations is right for you. This is a good chance for you to check in with your provider about disease prevention and staying healthy. In between checkups, there are plenty of things you can do on your own. Experts have done a lot of research about which lifestyle changes and preventive measures are most likely to keep you healthy. Ask your health care provider for more information. WEIGHT AND DIET  Eat a healthy diet  Be sure to include plenty of vegetables, fruits, low-fat dairy products, and lean protein.  Do not eat a lot of foods high in solid fats, added sugars, or salt.G    et regular exercise. This is one of the most important things you can do for your health.  Most adults should exercise for at least 150 minutes each week. The exercise should increase your heart rate and make you sweat (moderate-intensity exercise).  Most adults should also do strengthening exercises at least twice a week. This is in addition to the moderate-intensity exercise.  Maintain a healthy weight  Body mass index (BMI) is a measurement that can be used to identify possible weight problems. It estimates body fat based on height and weight. Your health care provider can help determine your BMI and help you achieve or maintain a healthy weight.  For females 92 years of age and older:   A BMI below 18.5 is considered underweight.  A BMI of 18.5 to 24.9 is normal.  A BMI of 25 to 29.9 is considered overweight.  A BMI of 30 and above is considered obese.  Watch levels of  cholesterol and blood lipids  You should start having your blood tested for lipids and cholesterol at 62 years of age, then have this test every 5 years.  You may need to have your cholesterol levels checked more often if:  Your lipid or cholesterol levels are high.  You are older than 62 years of age.  You are at high risk for heart disease.  CANCER SCREENING   Lung Cancer  Lung cancer screening is recommended for adults 73-13 years old who are at high risk for lung cancer because of a history of smoking.  A yearly low-dose CT scan of the lungs is recommended for people who:  Currently smoke.  Have quit within the past 15 years.  Have at least a 30-pack-year history of smoking. A pack year is smoking an average of one pack of cigarettes a day for 1 year.  Yearly screening should continue until it has been 15 years since you quit.  Yearly screening should stop if you develop a health problem that would prevent you from having lung cancer treatment.  Breast Cancer  Practice breast self-awareness. This means understanding how your breasts normally appear and feel.  It also means doing regular breast self-exams. Let your health care provider know about any changes, no matter how small.  If you are in your 20s or 30s, you should have a clinical breast exam (CBE) by a health care provider every 1-3  years as part of a regular health exam.  If you are 1 or older, have a CBE every year. Also consider having a breast X-ray (mammogram) every year.  If you have a family history of breast cancer, talk to your health care provider about genetic screening.  If you are at high risk for breast cancer, talk to your health care provider about having an MRI and a mammogram every year.  Breast cancer gene (BRCA) assessment is recommended for women who have family members with BRCA-related cancers. BRCA-related cancers include:  Breast.  Ovarian.  Tubal.  Peritoneal  cancers.  Results of the assessment will determine the need for genetic counseling and BRCA1 and BRCA2 testing. Cervical Cancer Routine pelvic examinations to screen for cervical cancer are no longer recommended for nonpregnant women who are considered low risk for cancer of the pelvic organs (ovaries, uterus, and vagina) and who do not have symptoms. A pelvic examination may be necessary if you have symptoms including those associated with pelvic infections. Ask your health care provider if a screening pelvic exam is right for you.   The Pap test is the screening test for cervical cancer for women who are considered at risk.  If you had a hysterectomy for a problem that was not cancer or a condition that could lead to cancer, then you no longer need Pap tests.  If you are older than 65 years, and you have had normal Pap tests for the past 10 years, you no longer need to have Pap tests.  If you have had past treatment for cervical cancer or a condition that could lead to cancer, you need Pap tests and screening for cancer for at least 20 years after your treatment.  If you no longer get a Pap test, assess your risk factors if they change (such as having a new sexual partner). This can affect whether you should start being screened again.  Some women have medical problems that increase their chance of getting cervical cancer. If this is the case for you, your health care provider may recommend more frequent screening and Pap tests.  The human papillomavirus (HPV) test is another test that may be used for cervical cancer screening. The HPV test looks for the virus that can cause cell changes in the cervix. The cells collected during the Pap test can be tested for HPV.  The HPV test can be used to screen women 14 years of age and older. Getting tested for HPV can extend the interval between normal Pap tests from three to five years.  An HPV test also should be used to screen women of any age who  have unclear Pap test results.  After 62 years of age, women should have HPV testing as often as Pap tests.  Colorectal Cancer  This type of cancer can be detected and often prevented.  Routine colorectal cancer screening usually begins at 62 years of age and continues through 62 years of age.  Your health care provider may recommend screening at an earlier age if you have risk factors for colon cancer.  Your health care provider may also recommend using home test kits to check for hidden blood in the stool.  A small camera at the end of a tube can be used to examine your colon directly (sigmoidoscopy or colonoscopy). This is done to check for the earliest forms of colorectal cancer.  Routine screening usually begins at age 56.  Direct examination of the colon should be repeated  every 5-10 years through 62 years of age. However, you may need to be screened more often if early forms of precancerous polyps or small growths are found. Skin Cancer  Check your skin from head to toe regularly.  Tell your health care provider about any new moles or changes in moles, especially if there is a change in a mole's shape or color.  Also tell your health care provider if you have a mole that is larger than the size of a pencil eraser.  Always use sunscreen. Apply sunscreen liberally and repeatedly throughout the day.  Protect yourself by wearing long sleeves, pants, a wide-brimmed hat, and sunglasses whenever you are outside. HEART DISEASE, DIABETES, AND HIGH BLOOD PRESSURE   Have your blood pressure checked at least every 1-2 years. High blood pressure causes heart disease and increases the risk of stroke.  If you are between 90 years and 33 years old, ask your health care provider if you should take aspirin to prevent strokes.  Have regular diabetes screenings. This involves taking a blood sample to check your fasting blood sugar level.  If you are at a normal weight and have a low risk for  diabetes, have this test once every three years after 62 years of age.  If you are overweight and have a high risk for diabetes, consider being tested at a younger age or more often. PREVENTING INFECTION  Hepatitis B  If you have a higher risk for hepatitis B, you should be screened for this virus. You are considered at high risk for hepatitis B if:  You were born in a country where hepatitis B is common. Ask your health care provider which countries are considered high risk.  Your parents were born in a high-risk country, and you have not been immunized against hepatitis B (hepatitis B vaccine).  You have HIV or AIDS.  You use needles to inject street drugs.  You live with someone who has hepatitis B.  You have had sex with someone who has hepatitis B.  You get hemodialysis treatment.  You take certain medicines for conditions, including cancer, organ transplantation, and autoimmune conditions. Hepatitis C  Blood testing is recommended for:  Everyone born from [birth year] through 1963.  Anyone with known risk factors for hepatitis C. Sexually transmitted infections (STIs)  You should be screened for sexually transmitted infections (STIs) including gonorrhea and chlamydia if:  You are sexually active and are younger than 62 years of age.  You are older than 62 years of age and your health care provider tells you that you are at risk for this type of infection.  Your sexual activity has changed since you were last screened and you are at an increased risk for chlamydia or gonorrhea. Ask your health care provider if you are at risk.  If you do not have HIV, but are at risk, it may be recommended that you take a prescription medicine daily to prevent HIV infection. This is called pre-exposure prophylaxis (PrEP). You are considered at risk if:  You are sexually active and do not regularly use condoms or know the HIV status of your partner(s).  You take drugs by injection.  You are  sexually active with a partner who has HIV. Talk with your health care provider about whether you are at high risk of being infected with HIV. If you choose to begin PrEP, you should first be tested for HIV. You should then be tested every 3 months for as long as  you are taking PrEP.  PREGNANCY   If you are premenopausal and you may become pregnant, ask your health care provider about preconception counseling.  If you may become pregnant, take 400 to 800 micrograms (mcg) of folic acid every day.  If you want to prevent pregnancy, talk to your health care provider about birth control (contraception). OSTEOPOROSIS AND MENOPAUSE   Osteoporosis is a disease in which the bones lose minerals and strength with aging. This can result in serious bone fractures. Your risk for osteoporosis can be identified using a bone density scan.  If you are 45 years of age or older, or if you are at risk for osteoporosis and fractures, ask your health care provider if you should be screened.  Ask your health care provider whether you should take a calcium or vitamin D supplement to lower your risk for osteoporosis.  Menopause may have certain physical symptoms and risks.  Hormone replacement therapy may reduce some of these symptoms and risks. Talk to your health care provider about whether hormone replacement therapy is right for you.  HOME CARE INSTRUCTIONS   Schedule regular health, dental, and eye exams.  Stay current with your immunizations.   Do not use any tobacco products including cigarettes, chewing tobacco, or electronic cigarettes.  If you are pregnant, do not drink alcohol.  If you are breastfeeding, limit how much and how often you drink alcohol.  Limit alcohol intake to no more than 1 drink per day for nonpregnant women. One drink equals 12 ounces of beer, 5 ounces of wine, or 1 ounces of hard liquor.  Do not use street drugs.  Do not share needles.  Ask your health care provider for  help if you need support or information about quitting drugs.  Tell your health care provider if you often feel depressed.  Tell your health care provider if you have ever been abused or do not feel safe at home. Document Released: 09/26/2010 Document Revised: 07/28/2013 Document Reviewed: 02/12/2013 [facility] Patient Information 2015 [place], [place]. This information is not intended to replace advice given to you by your health care provider. Make sure you discuss any questions you have with your health care provider.

## 2014-12-04 LAB — VARICELLA ZOSTER ABS, IGG/IGM
Varicella IgM: <0.91 index (ref 0.00–0.90)
Varicella zoster IgG: 216 index (ref >165)

## 2014-12-04 LAB — HEPATITIS C ANTIBODY: HCV Ab: NEGATIVE

## 2014-12-04 LAB — HIV ANTIBODY (ROUTINE TESTING W REFLEX): HIV 1&2 Ab, 4th Generation: NONREACTIVE

## 2014-12-05 ENCOUNTER — Encounter: Payer: Self-pay | Admitting: Internal Medicine

## 2014-12-05 NOTE — Progress Notes (Signed)
Patient ID: Ashlee Cox, female    DOB: [birth year]  Age: 64 y.o. MRN: [id number]  The patient is here for annual wellness examination and management of other chronic and acute problems.   The risk factors are reflected in the social history.  The roster of all physicians providing medical care to patient - is listed in the Snapshot section of the chart.  Home safety : The patient has smoke detectors in the home. They wear seatbelts.  There are no firearms at home. There is no violence in the home.   There is no risks for hepatitis, STDs or HIV. There is no   history of blood transfusion. They have no travel history to infectious disease endemic areas of the world.  The patient has seen their dentist in the last six month. They have seen their eye doctor in the last year. They admit to slight hearing difficulty with regard to whispered voices and some television programs.  They have deferred audiologic testing in the last year.  They do not  have excessive sun exposure. Discussed the need for sun protection: hats, long sleeves and use of sunscreen if there is significant sun exposure.   Diet: the importance of a healthy diet is discussed. They do have a healthy diet.  The benefits of regular aerobic exercise were discussed. She walks 4 times per week ,  20 minutes.   Depression screen: there are no signs or vegative symptoms of depression- irritability, change in appetite, anhedonia, sadness/tearfullness.   The following portions of the patient's history were reviewed and updated as appropriate: allergies, current medications, past family history, past medical history,  past surgical history, past social history  and problem list.  Visual acuity was not assessed per patient preference since she has regular follow up with her ophthalmologist. Hearing and body mass index were assessed and reviewed.   During the course of the visit the patient was educated and counseled about appropriate  screening and preventive services including : fall prevention , diabetes screening, nutrition counseling, colorectal cancer screening, and recommended immunizations.    CC: The primary encounter diagnosis was Hyperlipidemia. Diagnoses of Essential hypertension, Screen for STD (sexually transmitted disease), Need for shingles vaccine, Vitamin D deficiency, Breast cancer screening, Encounter for immunization, Controlled type 2 diabetes mellitus with diabetic nephropathy, Obesity, Hyperlipidemia with target LDL less than 100, and Encounter for preventive health examination were also pertinent to this visit.  History Ashlee Cox has a past medical history of H/O: rheumatic fever; Menopause; History of colonoscopy (2010); Cyst, breast; Hyperlipidemia; and Hypertension.   She has past surgical history that includes Appendectomy (2006); Cholecystectomy (2006); and Vaginal delivery.   Her family history includes Cancer (age of onset: 19) in her mother; Diabetes in her father, mother, and sister; Heart disease (age of onset: 75) in her mother; Stroke (age of onset: 89) in her father.She reports that she quit smoking about 8 years ago. Her smoking use included Cigarettes. She quit after 1 year of use. She has never used smokeless tobacco. She reports that she does not drink alcohol or use illicit drugs.  Outpatient Prescriptions Prior to Visit  Medication Sig Dispense Refill  . aspirin 81 MG tablet Take 81 mg by mouth daily.      . Cholecalciferol (VITAMIN D-3) 1000 UNITS CAPS Take 1,000 Units by mouth daily.    Nyoka Cowden Tea, Camillia sinensis, (GREEN TEA PO) Take 2 capsules by mouth 2 (two) times daily.    Marland Kitchen losartan (COZAAR) 100  MG tablet Take 1 tablet (100 mg total) by mouth daily. 90 tablet 1  . lovastatin (MEVACOR) 40 MG tablet TAKE ONE TABLET BY MOUTH AT BEDTIME 90 tablet 2  . Multiple Vitamin (MULTIVITAMIN) tablet Take 1 tablet by mouth daily.      Marland Kitchen omeprazole (PRILOSEC) 20 MG capsule Take 1 capsule (20  mg total) by mouth daily. 90 capsule 2   No facility-administered medications prior to visit.    Review of Systems   Patient denies headache, fevers, malaise, unintentional weight loss, skin rash, eye pain, sinus congestion and sinus pain, sore throat, dysphagia,  hemoptysis , cough, dyspnea, wheezing, chest pain, palpitations, orthopnea, edema, abdominal pain, nausea, melena, diarrhea, constipation, flank pain, dysuria, hematuria, urinary  Frequency, nocturia, numbness, tingling, seizures,  Focal weakness, Loss of consciousness,  Tremor, insomnia, depression, anxiety, and suicidal ideation.      Objective:  BP 148/80 mmHg  Pulse 74  Temp(Src) 98.3 F (36.8 C) (Oral)  Resp 14  Ht 5' 6.75" (1.695 m)  Wt 199 lb 12 oz (90.606 kg)  BMI 31.54 kg/m2  SpO2 95%  Physical Exam  General appearance: alert, cooperative and appears stated age Head: Normocephalic, without obvious abnormality, atraumatic Eyes: conjunctivae/corneas clear. PERRL, EOM's intact. Fundi benign. Ears: normal TM's and external ear canals both ears Nose: Nares normal. Septum midline. Mucosa normal. No drainage or sinus tenderness. Throat: lips, mucosa, and tongue normal; teeth and gums normal Neck: no adenopathy, no carotid bruit, no JVD, supple, symmetrical, trachea midline and thyroid not enlarged, symmetric, no tenderness/mass/nodules Lungs: clear to auscultation bilaterally Breasts: normal appearance, no masses or tenderness Heart: regular rate and rhythm, S1, S2 normal, no murmur, click, rub or gallop Abdomen: soft, non-tender; bowel sounds normal; no masses,  no organomegaly Extremities: extremities normal, atraumatic, no cyanosis or edema Pulses: 2+ and symmetric Skin: Skin color, texture, turgor normal. No rashes or lesions Neurologic: Alert and oriented X 3, normal strength and tone. Normal symmetric reflexes. Normal coordination and gait.   Assessment & Plan:   Problem List Items Addressed This Visit       Unprioritized   Hypertension    Elevated today on losartan 100 mg daily.  Reviewed list of meds, patient is not taking OTC meds that could be causing,. It.  Have asked patient to recheck bp at home a minimum of 5 times over the next 4 weeks and call readings to office for adjustment of medications.        Relevant Orders   Comprehensive metabolic panel (Completed)   Hyperlipidemia with target LDL less than 100    LDL and triglycerides are at goal on medium potency statin . She has no side effects and liver enzymes are normal. No changes today  Lab Results  Component Value Date   CHOL 180 12/03/2014   HDL 46.10 12/03/2014   LDLCALC 87 04/29/2013   LDLDIRECT 99.0 12/03/2014   TRIG 216.0* 12/03/2014   CHOLHDL 4 12/03/2014          Encounter for preventive health examination    Annual wellness exam was done as well as a comprehensive physical exam  .  During the course of the visit the patient was educated and counseled about appropriate screening and preventive services and screenings were brought up to date for cervical and breast cancer .Nutrition counseling, skin cancer screening has been recommended, along with review of the age appropriate recommended immunizations.  Printed recommendations for health maintenance screenings was given.  Obesity    I have addressed her weight fain and  BMI and recommended wt loss of 10% of body weigh over the next 6 months using a low glycemic index diet and regular exercise a minimum of 5 days per week.        Controlled type 2 diabetes mellitus with diabetic nephropathy    Historically  well-controlled on diett alone .  hemoglobin A1c has been consistently at or  less than 7.0 . Patient is up-to-date on eye exams and foot exam is normal today. Patient has minimal microalbuminuria ,  is tolerating statin therapy for CAD risk reduction and on ACE/ARB for reduction in proteinuria.  I have addressed her  BMI and encouraged  Continued weight  loss with goal of 10% of body weigh over the next 6 months using a low glycemic index diet and adding participation in regular exercise a minimum of 5 days per week.   Lab Results  Component Value Date   HGBA1C 6.7* 05/26/2014   Lab Results  Component Value Date   MICROALBUR 3.2* 05/26/2014            Other Visit Diagnoses    Hyperlipidemia    -  Primary    Relevant Orders    Lipid panel (Completed)    Screen for STD (sexually transmitted disease)        Relevant Orders    HIV antibody (Completed)    Hepatitis C antibody (Completed)    Need for shingles vaccine        Relevant Orders    Varicella Zoster Abs, IgG/IgM (Completed)    Vitamin D deficiency        Relevant Orders    Vit D  25 hydroxy (rtn osteoporosis monitoring) (Completed)    Breast cancer screening        Relevant Orders    MM DIGITAL SCREENING BILATERAL    Encounter for immunization           I am having Ms. Rice maintain her aspirin, multivitamin, Vitamin D-3, (Green Tea, Camillia sinensis, (GREEN TEA PO)), lovastatin, omeprazole, losartan, and docusate sodium.  Meds ordered this encounter  Medications  . docusate sodium (COLACE) 100 MG capsule    Sig: Take 100 mg by mouth daily.    There are no discontinued medications.  Follow-up: Return in about 6 months (around 06/02/2015).   [name], MD

## 2014-12-05 NOTE — Assessment & Plan Note (Signed)
I have addressed her weight fain and  BMI and recommended wt loss of 10% of body weigh over the next 6 months using a low glycemic index diet and regular exercise a minimum of 5 days per week.

## 2014-12-05 NOTE — Assessment & Plan Note (Signed)
Annual wellness exam was done as well as a comprehensive physical exam  .  During the course of the visit the patient was educated and counseled about appropriate screening and preventive services and screenings were brought up to date for cervical and breast cancer .Nutrition counseling, skin cancer screening has been recommended, along with review of the age appropriate recommended immunizations.  Printed recommendations for health maintenance screenings was given.

## 2014-12-05 NOTE — Addendum Note (Signed)
Addended by: [name] on: 12/05/2014 06:44 PM   Modules accepted: Orders, SmartSet

## 2014-12-05 NOTE — Assessment & Plan Note (Addendum)
Historically  well-controlled on diett alone .  hemoglobin A1c has been consistently at or  less than 7.0 . Patient is up-to-date on eye exams and foot exam is normal today. Patient has minimal microalbuminuria ,  is tolerating statin therapy for CAD risk reduction and on ACE/ARB for reduction in proteinuria.  I have addressed Ashlee Cox  BMI and encouraged  Continued weight loss with goal of 10% of body weigh over the next 6 months using a low glycemic index diet and adding participation in regular exercise a minimum of 5 days per week.   Lab Results  Component Value Date   HGBA1C 6.7* 05/26/2014   Lab Results  Component Value Date   MICROALBUR 3.2* 05/26/2014

## 2014-12-05 NOTE — Assessment & Plan Note (Signed)
LDL and triglycerides are at goal on medium potency statin . She has no side effects and liver enzymes are normal. No changes today  Lab Results  Component Value Date   CHOL 180 12/03/2014   HDL 46.10 12/03/2014   LDLCALC 87 04/29/2013   LDLDIRECT 99.0 12/03/2014   TRIG 216.0* 12/03/2014   CHOLHDL 4 12/03/2014

## 2014-12-05 NOTE — Assessment & Plan Note (Addendum)
Elevated today on losartan 100 mg daily.  Reviewed list of meds, patient is not taking OTC meds that could be causing,. It.  Have asked patient to recheck bp at home a minimum of 5 times over the next 4 weeks and call readings to office for adjustment of medications.

## 2015-01-26 ENCOUNTER — Other Ambulatory Visit: Payer: Self-pay | Admitting: Internal Medicine

## 2015-02-25 ENCOUNTER — Other Ambulatory Visit: Payer: Self-pay | Admitting: Internal Medicine

## 2015-04-29 ENCOUNTER — Other Ambulatory Visit: Payer: Self-pay | Admitting: Internal Medicine

## 2015-05-08 ENCOUNTER — Other Ambulatory Visit: Payer: Self-pay | Admitting: Internal Medicine

## 2015-06-02 ENCOUNTER — Ambulatory Visit: Payer: BLUE CROSS/BLUE SHIELD | Admitting: Internal Medicine

## 2015-06-04 ENCOUNTER — Ambulatory Visit (INDEPENDENT_AMBULATORY_CARE_PROVIDER_SITE_OTHER): Payer: BLUE CROSS/BLUE SHIELD | Admitting: Internal Medicine

## 2015-06-04 ENCOUNTER — Encounter: Payer: Self-pay | Admitting: Internal Medicine

## 2015-06-04 ENCOUNTER — Telehealth: Payer: Self-pay | Admitting: Internal Medicine

## 2015-06-04 VITALS — BP 120/72 | HR 84 | Temp 98.3°F | Resp 12 | Ht 67.0 in | Wt 194.5 lb

## 2015-06-04 DIAGNOSIS — E559 Vitamin D deficiency, unspecified: Secondary | ICD-10-CM

## 2015-06-04 DIAGNOSIS — E785 Hyperlipidemia, unspecified: Secondary | ICD-10-CM | POA: Diagnosis not present

## 2015-06-04 DIAGNOSIS — I10 Essential (primary) hypertension: Secondary | ICD-10-CM | POA: Diagnosis not present

## 2015-06-04 DIAGNOSIS — E1121 Type 2 diabetes mellitus with diabetic nephropathy: Secondary | ICD-10-CM

## 2015-06-04 LAB — COMPREHENSIVE METABOLIC PANEL
ALK PHOS: 104 U/L (ref 39–117)
ALT: 35 U/L (ref 0–35)
AST: 29 U/L (ref 0–37)
Albumin: 4.8 g/dL (ref 3.5–5.2)
BUN: 12 mg/dL (ref 6–23)
CHLORIDE: 102 meq/L (ref 96–112)
CO2: 27 mEq/L (ref 19–32)
Calcium: 10.3 mg/dL (ref 8.4–10.5)
Creatinine, Ser: 0.91 mg/dL (ref 0.40–1.20)
GFR: 66.45 mL/min (ref >60.00)
Glucose, Bld: 131 mg/dL — ABNORMAL HIGH (ref 70–99)
Potassium: 4.7 mEq/L (ref 3.5–5.1)
SODIUM: 140 meq/L (ref 135–145)
TOTAL PROTEIN: 7.9 g/dL (ref 6.0–8.3)
Total Bilirubin: 0.8 mg/dL (ref 0.2–1.2)

## 2015-06-04 LAB — LIPID PANEL
CHOLESTEROL: 163 mg/dL (ref 0–200)
HDL: 39.6 mg/dL (ref >39.00)
LDL Cholesterol: 93 mg/dL (ref 0–99)
NonHDL: 123.74
TRIGLYCERIDES: 154 mg/dL — AB (ref 0.0–149.0)
Total CHOL/HDL Ratio: 4
VLDL: 30.8 mg/dL (ref 0.0–40.0)

## 2015-06-04 LAB — MICROALBUMIN / CREATININE URINE RATIO
Creatinine,U: 223.9 mg/dL
MICROALB/CREAT RATIO: 0.5 mg/g (ref 0.0–30.0)
Microalb, Ur: 1.1 mg/dL (ref 0.0–1.9)

## 2015-06-04 LAB — LDL CHOLESTEROL, DIRECT: Direct LDL: 91 mg/dL

## 2015-06-04 LAB — HEMOGLOBIN A1C: Hgb A1c MFr Bld: 6.7 % — ABNORMAL HIGH (ref 4.6–6.5)

## 2015-06-04 LAB — VITAMIN D 25 HYDROXY (VIT D DEFICIENCY, FRACTURES): VITD: 38.17 ng/mL (ref 30.00–100.00)

## 2015-06-04 NOTE — Progress Notes (Signed)
Subjective:  Patient ID: Ashlee Cox, female    DOB: 03-13-1951  Age: 65 y.o. MRN: [id number]  CC: The primary encounter diagnosis was Hyperlipidemia with target LDL less than 100. Diagnoses of Controlled type 2 diabetes mellitus with diabetic nephropathy, without long-term current use of insulin ([place]), Vitamin D deficiency, and Essential hypertension were also pertinent to this visit.  HPI Ashlee Cox presents for  6 month follow up on hypertension and diet controlled dm.  Last seen in September did not have labs done then one year ago. She has been keeping her carbohydrate content well under the recommended  60 carbs per meal by eliminiating sweetened tea and soda and cutting out bagels for breakfast.  She does not check her sugars .  She has lost 5 lbs intentionally . She is walking regularly for exercise. She has not had a diabetic eye exam in over 2 years and no mammogram either   [facility] MAILED TO HER.   Lab Results  Component Value Date   HGBA1C 6.7* 06/04/2015   Lab Results  Component Value Date   MICROALBUR 1.1 06/04/2015     Outpatient Prescriptions Prior to Visit  Medication Sig Dispense Refill  . aspirin 81 MG tablet Take 81 mg by mouth daily.      . Cholecalciferol (VITAMIN D-3) 1000 UNITS CAPS Take 1,000 Units by mouth daily.    Marland Kitchen docusate sodium (COLACE) 100 MG capsule Take 100 mg by mouth daily.    Marland Kitchen losartan (COZAAR) 100 MG tablet TAKE ONE TABLET BY MOUTH ONCE DAILY 90 tablet 1  . lovastatin (MEVACOR) 40 MG tablet TAKE ONE TABLET BY MOUTH AT BEDTIME 90 tablet 3  . Multiple Vitamin (MULTIVITAMIN) tablet Take 1 tablet by mouth daily.      Marland Kitchen omeprazole (PRILOSEC) 20 MG capsule TAKE ONE CAPSULE BY MOUTH ONCE DAILY 90 capsule 0  . Green Tea, Camillia sinensis, (GREEN TEA PO) Take 2 capsules by mouth 2 (two) times daily. Reported on 06/04/2015     No facility-administered medications prior to visit.    Review of Systems;  Patient denies headache, fevers, malaise,  unintentional weight loss, skin rash, eye pain, sinus congestion and sinus pain, sore throat, dysphagia,  hemoptysis , cough, dyspnea, wheezing, chest pain, palpitations, orthopnea, edema, abdominal pain, nausea, melena, diarrhea, constipation, flank pain, dysuria, hematuria, urinary  Frequency, nocturia, numbness, tingling, seizures,  Focal weakness, Loss of consciousness,  Tremor, insomnia, depression, anxiety, and suicidal ideation.      Objective:  BP 120/72 mmHg  Pulse 84  Temp(Src) 98.3 F (36.8 C) (Oral)  Resp 12  Ht 5\' 7"  (1.702 m)  Wt 194 lb 8 oz (88.225 kg)  BMI 30.46 kg/m2  SpO2 95%  BP Readings from Last 3 Encounters:  06/04/15 120/72  12/03/14 148/80  05/26/14 124/64    Wt Readings from Last 3 Encounters:  06/04/15 194 lb 8 oz (88.225 kg)  12/03/14 199 lb 12 oz (90.606 kg)  05/26/14 191 lb 4 oz (86.75 kg)    General appearance: alert, cooperative and appears stated age Ears: normal TM's and external ear canals both ears Throat: lips, mucosa, and tongue normal; teeth and gums normal Neck: no adenopathy, no carotid bruit, supple, symmetrical, trachea midline and thyroid not enlarged, symmetric, no tenderness/mass/nodules Back: symmetric, no curvature. ROM normal. No CVA tenderness. Lungs: clear to auscultation bilaterally Heart: regular rate and rhythm, S1, S2 normal, no murmur, click, rub or gallop Abdomen: soft, non-tender; bowel sounds normal; no masses,  no organomegaly Pulses: 2+ and symmetric Skin: Skin color, texture, turgor normal. No rashes or lesions Lymph nodes: Cervical, supraclavicular, and axillary nodes normal.  Lab Results  Component Value Date   HGBA1C 6.7* 06/04/2015   HGBA1C 6.7* 05/26/2014   HGBA1C 6.5 04/29/2013    Lab Results  Component Value Date   CREATININE 0.91 06/04/2015   CREATININE 0.79 12/03/2014   CREATININE 0.90 05/26/2014    Lab Results  Component Value Date   GLUCOSE 131* 06/04/2015   CHOL 163 06/04/2015   TRIG  154.0* 06/04/2015   HDL 39.60 06/04/2015   LDLDIRECT 91.0 06/04/2015   LDLCALC 93 06/04/2015   ALT 35 06/04/2015   AST 29 06/04/2015   NA 140 06/04/2015   K 4.7 06/04/2015   CL 102 06/04/2015   CREATININE 0.91 06/04/2015   BUN 12 06/04/2015   CO2 27 06/04/2015   TSH 1.64 04/29/2013   HGBA1C 6.7* 06/04/2015   MICROALBUR 1.1 06/04/2015    No results found.  Assessment & Plan:   Problem List Items Addressed This Visit    Hypertension    Well controlled on current regimen. Renal function stable, no changes today.  Lab Results  Component Value Date   CREATININE 0.91 06/04/2015   Lab Results  Component Value Date   NA 140 06/04/2015   K 4.7 06/04/2015   CL 102 06/04/2015   CO2 27 06/04/2015         Hyperlipidemia with target LDL less than 100 - Primary    Managed with lovastatin . She is not fasting today. LFTS are normal. No changes today .  Lab Results  Component Value Date   ALT 35 06/04/2015   AST 29 06/04/2015   ALKPHOS 104 06/04/2015   BILITOT 0.8 06/04/2015         Relevant Orders   LDL cholesterol, direct (Completed)   Controlled type 2 diabetes mellitus with diabetic nephropathy ([place])    Historically  well-controlled on diet alone .  hemoglobin A1c has been consistently at or  less than 7.0 . Patient is NOT up-to-date on eye exams and foot exam is normal today. Patient has minimal microalbuminuria ,  is tolerating statin therapy for CAD risk reduction and on ACE/ARB for reduction in proteinuria.  I have addressed her  BMI and encouraged  Continued weight loss with goal of 10% of body weight over the next 6 months using a low glycemic index diet and adding participation in regular exercise a minimum of 5 days per week.   Lab Results  Component Value Date   HGBA1C 6.7* 06/04/2015   Lab Results  Component Value Date   MICROALBUR 1.1 06/04/2015             Relevant Orders   Comprehensive metabolic panel (Completed)   Hemoglobin A1c (Completed)     Lipid panel (Completed)   Microalbumin / creatinine urine ratio (Completed)   Ambulatory referral to Ophthalmology    Other Visit Diagnoses    Vitamin D deficiency        Relevant Orders    VITAMIN D 25 Hydroxy (Vit-D Deficiency, Fractures) (Completed)     A total of 25 minutes of face to face time was spent with patient more than half of which was spent in counselling about the above mentioned conditions  and coordination of care   I am having Ms. Cox maintain her aspirin, multivitamin, Vitamin D-3, (Green Tea, Camillia sinensis, (GREEN TEA PO)), docusate sodium, lovastatin, losartan, and omeprazole.  No orders of the defined types were placed in this encounter.    There are no discontinued medications.  Follow-up: No Follow-up on file.   [name], MD

## 2015-06-04 NOTE — Patient Instructions (Signed)
Diabetes and Exercise Exercising regularly is important. It is not just about losing weight. It has many health benefits, such as:  Improving your overall fitness, flexibility, and endurance.  Increasing your bone density.  Helping with weight control.  Decreasing your body fat.  Increasing your muscle strength.  Reducing stress and tension.  Improving your overall health. People with diabetes who exercise gain additional benefits because exercise:  Reduces appetite.  Improves the body's use of blood sugar (glucose).  Helps lower or control blood glucose.  Decreases blood pressure.  Helps control blood lipids (such as cholesterol and triglycerides).  Improves the body's use of the hormone insulin by:  Increasing the body's insulin sensitivity.  Reducing the body's insulin needs.  Decreases the risk for heart disease because exercising:  Lowers cholesterol and triglycerides levels.  Increases the levels of good cholesterol (such as high-density lipoproteins [HDL]) in the body.  Lowers blood glucose levels. YOUR ACTIVITY PLAN  Choose an activity that you enjoy, and set realistic goals. To exercise safely, you should begin practicing any new physical activity slowly, and gradually increase the intensity of the exercise over time. Your health care provider or diabetes educator can help create an activity plan that works for you. General recommendations include:  Encouraging children to engage in at least 60 minutes of physical activity each day.  Stretching and performing strength training exercises, such as yoga or weight lifting, at least 2 times per week.  Performing a total of at least 150 minutes of moderate-intensity exercise each week, such as brisk walking or water aerobics.  Exercising at least 3 days per week, making sure you allow no more than 2 consecutive days to pass without exercising.  Avoiding long periods of inactivity (90 minutes or more). When you  have to spend an extended period of time sitting down, take frequent breaks to walk or stretch. RECOMMENDATIONS FOR EXERCISING WITH TYPE 1 OR TYPE 2 DIABETES   Check your blood glucose before exercising. If blood glucose levels are greater than 240 mg/dL, check for urine ketones. Do not exercise if ketones are present.  Avoid injecting insulin into areas of the body that are going to be exercised. For example, avoid injecting insulin into:  The arms when playing tennis.  The legs when jogging.  Keep a record of:  Food intake before and after you exercise.  Expected peak times of insulin action.  Blood glucose levels before and after you exercise.  The type and amount of exercise you have done.  Review your records with your health care provider. Your health care provider will help you to develop guidelines for adjusting food intake and insulin amounts before and after exercising.  If you take insulin or oral hypoglycemic agents, watch for signs and symptoms of hypoglycemia. They include:  Dizziness.  Shaking.  Sweating.  Chills.  Confusion.  Drink plenty of water while you exercise to prevent dehydration or heat stroke. Body water is lost during exercise and must be replaced.  Talk to your health care provider before starting an exercise program to make sure it is safe for you. Remember, almost any type of activity is better than none.   This information is not intended to replace advice given to you by your health care provider. Make sure you discuss any questions you have with your health care provider.   Document Released: 06/03/2003 Document Revised: 07/28/2014 Document Reviewed: 08/20/2012 Elsevier Interactive Patient Education 2016 [employer]  

## 2015-06-04 NOTE — Telephone Encounter (Signed)
Order inpatient needs mammogram scheduled.

## 2015-06-04 NOTE — Progress Notes (Signed)
Pre-visit discussion using our clinic review tool. No additional management support is needed unless otherwise documented below in the visit note.  

## 2015-06-06 ENCOUNTER — Encounter: Payer: Self-pay | Admitting: Internal Medicine

## 2015-06-06 NOTE — Assessment & Plan Note (Signed)
Managed with lovastatin . She is not fasting today. LFTS are normal. No changes today .  Lab Results  Component Value Date   ALT 35 06/04/2015   AST 29 06/04/2015   ALKPHOS 104 06/04/2015   BILITOT 0.8 06/04/2015

## 2015-06-06 NOTE — Assessment & Plan Note (Signed)
Well controlled on current regimen. Renal function stable, no changes today.  Lab Results  Component Value Date   CREATININE 0.91 06/04/2015   Lab Results  Component Value Date   NA 140 06/04/2015   K 4.7 06/04/2015   CL 102 06/04/2015   CO2 27 06/04/2015

## 2015-06-06 NOTE — Assessment & Plan Note (Signed)
Historically  well-controlled on diet alone .  hemoglobin A1c has been consistently at or  less than 7.0 . Patient is NOT up-to-date on eye exams and foot exam is normal today. Patient has minimal microalbuminuria ,  is tolerating statin therapy for CAD risk reduction and on ACE/ARB for reduction in proteinuria.  I have addressed her  BMI and encouraged  Continued weight loss with goal of 10% of body weight over the next 6 months using a low glycemic index diet and adding participation in regular exercise a minimum of 5 days per week.   Lab Results  Component Value Date   HGBA1C 6.7* 06/04/2015   Lab Results  Component Value Date   MICROALBUR 1.1 06/04/2015

## 2015-06-07 ENCOUNTER — Encounter: Payer: Self-pay | Admitting: Internal Medicine

## 2015-06-07 ENCOUNTER — Telehealth: Payer: Self-pay | Admitting: Internal Medicine

## 2015-06-07 NOTE — Telephone Encounter (Signed)
Pt called back stating she received a call. Pt had labs done 06/04/15. Call pt @ [phone]. Thank you!

## 2015-06-07 NOTE — Telephone Encounter (Signed)
Spoke with the patient and reviewed mychart message that was not read.  thanks

## 2015-06-17 ENCOUNTER — Ambulatory Visit
Admission: RE | Admit: 2015-06-17 | Discharge: 2015-06-17 | Disposition: A | Discharge status: Home / Self Care | Payer: BLUE CROSS/BLUE SHIELD | Source: Ambulatory Visit | Attending: Internal Medicine | Admitting: Internal Medicine

## 2015-06-17 DIAGNOSIS — Z1231 Encounter for screening mammogram for malignant neoplasm of breast: Secondary | ICD-10-CM | POA: Insufficient documentation

## 2015-06-17 DIAGNOSIS — Z1239 Encounter for other screening for malignant neoplasm of breast: Secondary | ICD-10-CM

## 2015-06-21 NOTE — Telephone Encounter (Signed)
Mailed unread message to patient.  

## 2015-08-09 ENCOUNTER — Other Ambulatory Visit: Payer: Self-pay | Admitting: Internal Medicine

## 2015-10-07 LAB — HM DIABETES EYE EXAM

## 2015-10-11 ENCOUNTER — Encounter: Payer: Self-pay | Admitting: Internal Medicine

## 2015-10-30 ENCOUNTER — Other Ambulatory Visit: Payer: Self-pay | Admitting: Internal Medicine

## 2015-12-08 ENCOUNTER — Ambulatory Visit (INDEPENDENT_AMBULATORY_CARE_PROVIDER_SITE_OTHER): Payer: BLUE CROSS/BLUE SHIELD | Admitting: Internal Medicine

## 2015-12-08 ENCOUNTER — Encounter: Payer: Self-pay | Admitting: Internal Medicine

## 2015-12-08 VITALS — BP 132/72 | HR 81 | Temp 97.6°F | Resp 16 | Ht 67.0 in | Wt 197.2 lb

## 2015-12-08 DIAGNOSIS — E11319 Type 2 diabetes mellitus with unspecified diabetic retinopathy without macular edema: Secondary | ICD-10-CM | POA: Insufficient documentation

## 2015-12-08 DIAGNOSIS — E785 Hyperlipidemia, unspecified: Secondary | ICD-10-CM

## 2015-12-08 DIAGNOSIS — L409 Psoriasis, unspecified: Secondary | ICD-10-CM

## 2015-12-08 DIAGNOSIS — E083219 Diabetes mellitus due to underlying condition with mild nonproliferative diabetic retinopathy with macular edema, unspecified eye: Secondary | ICD-10-CM | POA: Diagnosis not present

## 2015-12-08 DIAGNOSIS — Z23 Encounter for immunization: Secondary | ICD-10-CM | POA: Diagnosis not present

## 2015-12-08 DIAGNOSIS — E1121 Type 2 diabetes mellitus with diabetic nephropathy: Secondary | ICD-10-CM | POA: Diagnosis not present

## 2015-12-08 DIAGNOSIS — I10 Essential (primary) hypertension: Secondary | ICD-10-CM

## 2015-12-08 LAB — MICROALBUMIN / CREATININE URINE RATIO
Creatinine,U: 53.7 mg/dL
Microalb Creat Ratio: 1.3 mg/g (ref 0.0–30.0)
Microalb, Ur: <0.7 mg/dL (ref 0.0–1.9)

## 2015-12-08 LAB — COMPREHENSIVE METABOLIC PANEL
ALT: 19 U/L (ref 0–35)
AST: 18 U/L (ref 0–37)
Albumin: 4.4 g/dL (ref 3.5–5.2)
Alkaline Phosphatase: 96 U/L (ref 39–117)
BILIRUBIN TOTAL: 0.4 mg/dL (ref 0.2–1.2)
BUN: 18 mg/dL (ref 6–23)
CALCIUM: 9.4 mg/dL (ref 8.4–10.5)
CO2: 25 meq/L (ref 19–32)
CREATININE: 0.82 mg/dL (ref 0.40–1.20)
Chloride: 105 mEq/L (ref 96–112)
GFR: 74.81 mL/min (ref >60.00)
GLUCOSE: 140 mg/dL — AB (ref 70–99)
Potassium: 3.9 mEq/L (ref 3.5–5.1)
SODIUM: 139 meq/L (ref 135–145)
Total Protein: 7.7 g/dL (ref 6.0–8.3)

## 2015-12-08 LAB — LDL CHOLESTEROL, DIRECT: LDL DIRECT: 79 mg/dL

## 2015-12-08 LAB — HEMOGLOBIN A1C: Hgb A1c MFr Bld: 6.6 % — ABNORMAL HIGH (ref 4.6–6.5)

## 2015-12-08 MED ORDER — BETAMETHASONE DIPROPIONATE 0.05 % EX LOTN: TOPICAL_LOTION | Freq: Two times a day (BID) | CUTANEOUS | 0 refills | Status: DC

## 2015-12-08 NOTE — Patient Instructions (Addendum)
I have prescribed a steroid lotion to use on your scalp area twice daily for management of psoriasis .    If this does not get it under control , let me know.   You received the flu vaccine today    You are due for your annual exam any time now.  Please schedule that today

## 2015-12-08 NOTE — Progress Notes (Signed)
Subjective:  Patient ID: Ashlee Cox, female    DOB: [birth year]  Age: 65 y.o. MRN: [id number]  CC: The primary encounter diagnosis was Hyperlipidemia. Diagnoses of Mild nonproliferative diabetic retinopathy with macular edema associated with diabetes mellitus due to underlying condition ([place]), Encounter for immunization, Controlled type 2 diabetes mellitus with diabetic nephropathy, without long-term current use of insulin ([place]), Psoriasis of scalp, and Essential hypertension were also pertinent to this visit.   Ashlee Cox presents for  6 month follow up on diabetes with retinopathy.  Patient has no complaints today.  Patient is following a low glycemic index diet and taking all prescribed medications regularly without side effects.  Fasting sugars have been under less than 140 most of the time and post prandials have been under 160 except on rare occasions. Patient is exercising about 3 times per week and intentionally trying to lose weight .  Patient has had an eye exam in the last 12 months and checks feet regularly for signs of infection.  Patient does not walk barefoot outside,  And denies any numbness tingling or burning in feet. Patient is up to date on all recommended vaccinations  .  [facility].   2) Psoriasis on scalp at base on neck.   Lab Results  Component Value Date   HGBA1C 6.6 (H) 12/08/2015    Body mass index is 30.89 kg/m.  Outpatient Medications Prior to Visit  Medication Sig Dispense Refill  . aspirin 81 MG tablet Take 81 mg by mouth daily.      . Cholecalciferol (VITAMIN D-3) 1000 UNITS CAPS Take 1,000 Units by mouth daily.    Marland Kitchen docusate sodium (COLACE) 100 MG capsule Take 100 mg by mouth daily.    Marland Kitchen losartan (COZAAR) 100 MG tablet TAKE ONE TABLET BY MOUTH ONCE DAILY 90 tablet 1  . lovastatin (MEVACOR) 40 MG tablet TAKE ONE TABLET BY MOUTH AT BEDTIME 90 tablet 3  . Multiple Vitamin (MULTIVITAMIN) tablet Take 1 tablet by mouth  daily.      Marland Kitchen omeprazole (PRILOSEC) 20 MG capsule TAKE ONE CAPSULE BY MOUTH ONCE DAILY 90 capsule 2  . Green Tea, Camillia sinensis, (GREEN TEA PO) Take 2 capsules by mouth 2 (two) times daily. Reported on 06/04/2015     No facility-administered medications prior to visit.     Review of Systems;  Patient denies headache, fevers, malaise, unintentional weight loss, skin rash, eye pain, sinus congestion and sinus pain, sore throat, dysphagia,  hemoptysis , cough, dyspnea, wheezing, chest pain, palpitations, orthopnea, edema, abdominal pain, nausea, melena, diarrhea, constipation, flank pain, dysuria, hematuria, urinary  Frequency, nocturia, numbness, tingling, seizures,  Focal weakness, Loss of consciousness,  Tremor, insomnia, depression, anxiety, and suicidal ideation.      Objective:  BP 132/72 (BP Location: Right Arm, Patient Position: Sitting, Cuff Size: Large)   Pulse 81   Temp 97.6 F (36.4 C) (Oral)   Resp 16   Ht 5\' 7"  (1.702 m)   Wt 197 lb 3.2 oz (89.4 kg)   SpO2 99%   BMI 30.89 kg/m   BP Readings from Last 3 Encounters:  12/08/15 132/72  06/04/15 120/72  12/03/14 (!) 148/80    Wt Readings from Last 3 Encounters:  12/08/15 197 lb 3.2 oz (89.4 kg)  06/04/15 194 lb 8 oz (88.2 kg)  12/03/14 199 lb 12 oz (90.6 kg)    General appearance: alert, cooperative and appears stated age Ears: normal TM's  and external ear canals both ears Throat: lips, mucosa, and tongue normal; teeth and gums normal Neck: no adenopathy, no carotid bruit, supple, symmetrical, trachea midline and thyroid not enlarged, symmetric, no tenderness/mass/nodules Back: symmetric, no curvature. ROM normal. No CVA tenderness. Lungs: clear to auscultation bilaterally Heart: regular rate and rhythm, S1, S2 normal, no murmur, click, rub or gallop Abdomen: soft, non-tender; bowel sounds normal; no masses,  no organomegaly Pulses: 2+ and symmetric Skin: occipital scalp with psoriatic changes  Lymph nodes:  Cervical, supraclavicular, and axillary nodes normal.   Lab Results  Component Value Date   HGBA1C 6.6 (H) 12/08/2015   HGBA1C 6.7 (H) 06/04/2015   HGBA1C 6.7 (H) 05/26/2014    Lab Results  Component Value Date   CREATININE 0.82 12/08/2015   CREATININE 0.91 06/04/2015   CREATININE 0.79 12/03/2014    Lab Results  Component Value Date   GLUCOSE 140 (H) 12/08/2015   CHOL 163 06/04/2015   TRIG 154.0 (H) 06/04/2015   HDL 39.60 06/04/2015   LDLDIRECT 79.0 12/08/2015   LDLCALC 93 06/04/2015   ALT 19 12/08/2015   AST 18 12/08/2015   NA 139 12/08/2015   K 3.9 12/08/2015   CL 105 12/08/2015   CREATININE 0.82 12/08/2015   BUN 18 12/08/2015   CO2 25 12/08/2015   TSH 1.64 04/29/2013   HGBA1C 6.6 (H) 12/08/2015   MICROALBUR <0.7 12/08/2015    Mm Digital Screening Bilateral  Result Date: 06/17/2015 CLINICAL DATA:  Screening. EXAM: DIGITAL SCREENING BILATERAL MAMMOGRAM WITH CAD COMPARISON:  Previous exam(s). ACR Breast Density Category c: The breast tissue is heterogeneously dense, which may obscure small masses. FINDINGS: There are no findings suspicious for malignancy. Images were processed with CAD. IMPRESSION: No mammographic evidence of malignancy. A result letter of this screening mammogram will be mailed directly to the patient. RECOMMENDATION: Screening mammogram in one year. (Code:SM-B-01Y) BI-RADS CATEGORY  1: Negative. Electronically Signed   By: Pamelia Hoit M.D.   On: 06/17/2015 16:47    Assessment & Plan:   Problem List Items Addressed This Visit    Hypertension    Well controlled on current regimen. Renal function stable, no changes today.  Lab Results  Component Value Date   CREATININE 0.82 12/08/2015   Lab Results  Component Value Date   NA 139 12/08/2015   K 3.9 12/08/2015   CL 105 12/08/2015   CO2 25 12/08/2015         Controlled type 2 diabetes mellitus with diabetic nephropathy ([place])    Historically  well-controlled on diet alone .  hemoglobin A1c  has been consistently at or  less than 7.0 . Patient is NOT up-to-date on eye exams and foot exam is normal today. Patient has minimal microalbuminuria ,  is tolerating statin therapy for CAD risk reduction and on ACE/ARB for reduction in proteinuria.  I have addressed her  BMI and encouraged  Continued weight loss with goal of 10% of body weight over the next 6 months using a low glycemic index diet and adding participation in regular exercise a minimum of 5 days per week.   Lab Results  Component Value Date   HGBA1C 6.6 (H) 12/08/2015   Lab Results  Component Value Date   MICROALBUR <0.7 12/08/2015             Psoriasis of scalp    Betamethasone lotion for scalp.      Diabetic retinopathy ([place])    Vision in still 20/15 .  Sees Dr Marvel Plan  [facility]       Relevant Orders   Hemoglobin A1c (Completed)   Comprehensive metabolic panel (Completed)   Microalbumin / creatinine urine ratio (Completed)    Other Visit Diagnoses    Hyperlipidemia    -  Primary   Relevant Orders   LDL cholesterol, direct (Completed)   Encounter for immunization       Relevant Orders   Flu Vaccine QUAD 36+ mos IM (Completed)     A total of 25 minutes of face to face time was spent with patient more than half of which was spent in counselling about the above mentioned conditions  and coordination of care  I have discontinued Ms. Cox's (Green Tea, Camillia sinensis, (GREEN TEA PO)). I am also having her start on betamethasone dipropionate. Additionally, I am having her maintain her aspirin, multivitamin, Vitamin D-3, docusate sodium, lovastatin, omeprazole, and losartan.  Meds ordered this encounter  Medications  . betamethasone dipropionate 0.05 % lotion    Sig: Apply topically 2 (two) times daily.    Dispense:  60 mL    Refill:  0    Medications Discontinued During This Encounter  Medication Reason  . Green Tea, Camillia sinensis, (GREEN TEA PO) Patient Discharge    Follow-up: No  Follow-up on file.   [name], MD

## 2015-12-08 NOTE — Assessment & Plan Note (Signed)
Vision in still 20/15 .  Sees Dr Marvel Plan [facility]

## 2015-12-10 DIAGNOSIS — L409 Psoriasis, unspecified: Secondary | ICD-10-CM | POA: Insufficient documentation

## 2015-12-10 NOTE — Assessment & Plan Note (Signed)
Betamethasone lotion for scalp.

## 2015-12-10 NOTE — Assessment & Plan Note (Signed)
Historically  well-controlled on diet alone .  hemoglobin A1c has been consistently at or  less than 7.0 . Patient is NOT up-to-date on eye exams and foot exam is normal today. Patient has minimal microalbuminuria ,  is tolerating statin therapy for CAD risk reduction and on ACE/ARB for reduction in proteinuria.  I have addressed her  BMI and encouraged  Continued weight loss with goal of 10% of body weight over the next 6 months using a low glycemic index diet and adding participation in regular exercise a minimum of 5 days per week.   Lab Results  Component Value Date   HGBA1C 6.6 (H) 12/08/2015   Lab Results  Component Value Date   MICROALBUR <0.7 12/08/2015

## 2015-12-10 NOTE — Assessment & Plan Note (Signed)
Well controlled on current regimen. Renal function stable, no changes today.  Lab Results  Component Value Date   CREATININE 0.82 12/08/2015   Lab Results  Component Value Date   NA 139 12/08/2015   K 3.9 12/08/2015   CL 105 12/08/2015   CO2 25 12/08/2015

## 2015-12-12 ENCOUNTER — Encounter: Payer: Self-pay | Admitting: Internal Medicine

## 2016-02-03 ENCOUNTER — Ambulatory Visit (INDEPENDENT_AMBULATORY_CARE_PROVIDER_SITE_OTHER): Payer: BLUE CROSS/BLUE SHIELD | Admitting: Internal Medicine

## 2016-02-03 ENCOUNTER — Other Ambulatory Visit (HOSPITAL_COMMUNITY)
Admission: RE | Admit: 2016-02-03 | Discharge: 2016-02-03 | Disposition: A | Discharge status: Home / Self Care | Payer: BLUE CROSS/BLUE SHIELD | Source: Ambulatory Visit | Attending: Internal Medicine | Admitting: Internal Medicine

## 2016-02-03 ENCOUNTER — Encounter: Payer: Self-pay | Admitting: Internal Medicine

## 2016-02-03 VITALS — BP 128/74 | HR 85 | Temp 98.2°F | Resp 16 | Ht 66.25 in | Wt 197.4 lb

## 2016-02-03 DIAGNOSIS — Z1151 Encounter for screening for human papillomavirus (HPV): Secondary | ICD-10-CM | POA: Diagnosis present

## 2016-02-03 DIAGNOSIS — E785 Hyperlipidemia, unspecified: Secondary | ICD-10-CM | POA: Diagnosis not present

## 2016-02-03 DIAGNOSIS — Z0000 Encounter for general adult medical examination without abnormal findings: Secondary | ICD-10-CM | POA: Diagnosis not present

## 2016-02-03 DIAGNOSIS — E1121 Type 2 diabetes mellitus with diabetic nephropathy: Secondary | ICD-10-CM | POA: Diagnosis not present

## 2016-02-03 DIAGNOSIS — Z01419 Encounter for gynecological examination (general) (routine) without abnormal findings: Secondary | ICD-10-CM | POA: Insufficient documentation

## 2016-02-03 DIAGNOSIS — Z124 Encounter for screening for malignant neoplasm of cervix: Secondary | ICD-10-CM | POA: Diagnosis not present

## 2016-02-03 DIAGNOSIS — E1169 Type 2 diabetes mellitus with other specified complication: Secondary | ICD-10-CM

## 2016-02-03 DIAGNOSIS — E119 Type 2 diabetes mellitus without complications: Secondary | ICD-10-CM | POA: Diagnosis not present

## 2016-02-03 DIAGNOSIS — Z113 Encounter for screening for infections with a predominantly sexual mode of transmission: Secondary | ICD-10-CM | POA: Insufficient documentation

## 2016-02-03 DIAGNOSIS — I10 Essential (primary) hypertension: Secondary | ICD-10-CM | POA: Diagnosis not present

## 2016-02-03 LAB — LIPID PANEL
CHOL/HDL RATIO: 3
Cholesterol: 168 mg/dL (ref 0–200)
HDL: 49.8 mg/dL (ref >39.00)
LDL Cholesterol: 86 mg/dL (ref 0–99)
NONHDL: 118.5
Triglycerides: 163 mg/dL — ABNORMAL HIGH (ref 0.0–149.0)
VLDL: 32.6 mg/dL (ref 0.0–40.0)

## 2016-02-03 LAB — COMPREHENSIVE METABOLIC PANEL
ALK PHOS: 96 U/L (ref 39–117)
ALT: 22 U/L (ref 0–35)
AST: 18 U/L (ref 0–37)
Albumin: 4.5 g/dL (ref 3.5–5.2)
BILIRUBIN TOTAL: 0.7 mg/dL (ref 0.2–1.2)
BUN: 19 mg/dL (ref 6–23)
CO2: 23 meq/L (ref 19–32)
Calcium: 10 mg/dL (ref 8.4–10.5)
Chloride: 106 mEq/L (ref 96–112)
Creatinine, Ser: 0.8 mg/dL (ref 0.40–1.20)
GFR: 76.93 mL/min (ref >60.00)
GLUCOSE: 144 mg/dL — AB (ref 70–99)
POTASSIUM: 4.4 meq/L (ref 3.5–5.1)
SODIUM: 140 meq/L (ref 135–145)
TOTAL PROTEIN: 7.5 g/dL (ref 6.0–8.3)

## 2016-02-03 NOTE — Patient Instructions (Addendum)
I do recommend the new Shingles vaccine (Shingrix) if your test today confirms yo have been exposed to chicken pox  Return in march for diabetes follow up.,  And have fasting labs done prior to the visit.   Menopause is a normal process in which your reproductive ability comes to an end. This process happens gradually over a span of months to years, usually between the ages of 78 and 51. Menopause is complete when you have missed 12 consecutive menstrual periods. It is important to talk with your health care provider about some of the most common conditions that affect postmenopausal women, such as heart disease, cancer, and bone loss (osteoporosis). Adopting a healthy lifestyle and getting preventive care can help to promote your health and wellness. Those actions can also lower your chances of developing some of these common conditions. WHAT SHOULD I KNOW ABOUT MENOPAUSE? During menopause, you may experience a number of symptoms, such as:  Moderate-to-severe hot flashes.  Night sweats.  Decrease in sex drive.  Mood swings.  Headaches.  Tiredness.  Irritability.  Memory problems.  Insomnia. Choosing to treat or not to treat menopausal changes is an individual decision that you make with your health care provider. WHAT SHOULD I KNOW ABOUT HORMONE REPLACEMENT THERAPY AND SUPPLEMENTS? Hormone therapy products are effective for treating symptoms that are associated with menopause, such as hot flashes and night sweats. Hormone replacement carries certain risks, especially as you become older. If you are thinking about using estrogen or estrogen with progestin treatments, discuss the benefits and risks with your health care provider. WHAT SHOULD I KNOW ABOUT HEART DISEASE AND STROKE? Heart disease, heart attack, and stroke become more likely as you age. This may be due, in part, to the hormonal changes that your body experiences during menopause. These can affect how your body processes  dietary fats, triglycerides, and cholesterol. Heart attack and stroke are both medical emergencies. There are many things that you can do to help prevent heart disease and stroke:  Have your blood pressure checked at least every 1-2 years. High blood pressure causes heart disease and increases the risk of stroke.  If you are 90-70 years old, ask your health care provider if you should take aspirin to prevent a heart attack or a stroke.  Do not use any tobacco products, including cigarettes, chewing tobacco, or electronic cigarettes. If you need help quitting, ask your health care provider.  It is important to eat a healthy diet and maintain a healthy weight.  Be sure to include plenty of vegetables, fruits, low-fat dairy products, and lean protein.  Avoid eating foods that are high in solid fats, added sugars, or salt (sodium).  Get regular exercise. This is one of the most important things that you can do for your health.  Try to exercise for at least 150 minutes each week. The type of exercise that you do should increase your heart rate and make you sweat. This is known as moderate-intensity exercise.  Try to do strengthening exercises at least twice each week. Do these in addition to the moderate-intensity exercise.  Know your numbers.Ask your health care provider to check your cholesterol and your blood glucose. Continue to have your blood tested as directed by your health care provider. WHAT SHOULD I KNOW ABOUT CANCER SCREENING? There are several types of cancer. Take the following steps to reduce your risk and to catch any cancer development as early as possible. Breast Cancer  Practice breast self-awareness.  This means understanding  how your breasts normally appear and feel.  It also means doing regular breast self-exams. Let your health care provider know about any changes, no matter how small.  If you are 52 or older, have a clinician do a breast exam (clinical breast exam  or CBE) every year. Depending on your age, family history, and medical history, it may be recommended that you also have a yearly breast X-ray (mammogram).  If you have a family history of breast cancer, talk with your health care provider about genetic screening.  If you are at high risk for breast cancer, talk with your health care provider about having an MRI and a mammogram every year.  Breast cancer (BRCA) gene test is recommended for women who have family members with BRCA-related cancers. Results of the assessment will determine the need for genetic counseling and BRCA1 and for BRCA2 testing. BRCA-related cancers include these types:  Breast. This occurs in males or females.  Ovarian.  Tubal. This may also be called fallopian tube cancer.  Cancer of the abdominal or pelvic lining (peritoneal cancer).  Prostate.  Pancreatic. Cervical, Uterine, and Ovarian Cancer Your health care provider may recommend that you be screened regularly for cancer of the pelvic organs. These include your ovaries, uterus, and vagina. This screening involves a pelvic exam, which includes checking for microscopic changes to the surface of your cervix (Pap test).  For women ages 21-65, health care providers may recommend a pelvic exam and a Pap test every three years. For women ages 71-65, they may recommend the Pap test and pelvic exam, combined with testing for human papilloma virus (HPV), every five years. Some types of HPV increase your risk of cervical cancer. Testing for HPV may also be done on women of any age who have unclear Pap test results.  Other health care providers may not recommend any screening for nonpregnant women who are considered low risk for pelvic cancer and have no symptoms. Ask your health care provider if a screening pelvic exam is right for you.  If you have had past treatment for cervical cancer or a condition that could lead to cancer, you need Pap tests and screening for cancer  for at least 20 years after your treatment. If Pap tests have been discontinued for you, your risk factors (such as having a new sexual partner) need to be reassessed to determine if you should start having screenings again. Some women have medical problems that increase the chance of getting cervical cancer. In these cases, your health care provider may recommend that you have screening and Pap tests more often.  If you have a family history of uterine cancer or ovarian cancer, talk with your health care provider about genetic screening.  If you have vaginal bleeding after reaching menopause, tell your health care provider.  There are currently no reliable tests available to screen for ovarian cancer. Lung Cancer Lung cancer screening is recommended for adults 30-58 years old who are at high risk for lung cancer because of a history of smoking. A yearly low-dose CT scan of the lungs is recommended if you:  Currently smoke.  Have a history of at least 30 pack-years of smoking and you currently smoke or have quit within the past 15 years. A pack-year is smoking an average of one pack of cigarettes per day for one year. Yearly screening should:  Continue until it has been 15 years since you quit.  Stop if you develop a health problem that would prevent  you from having lung cancer treatment. Colorectal Cancer  This type of cancer can be detected and can often be prevented.  Routine colorectal cancer screening usually begins at age 49 and continues through age 63.  If you have risk factors for colon cancer, your health care provider may recommend that you be screened at an earlier age.  If you have a family history of colorectal cancer, talk with your health care provider about genetic screening.  Your health care provider may also recommend using home test kits to check for hidden blood in your stool.  A small camera at the end of a tube can be used to examine your colon directly  (sigmoidoscopy or colonoscopy). This is done to check for the earliest forms of colorectal cancer.  Direct examination of the colon should be repeated every 5-10 years until age 50. However, if early forms of precancerous polyps or small growths are found or if you have a family history or genetic risk for colorectal cancer, you may need to be screened more often. Skin Cancer  Check your skin from head to toe regularly.  Monitor any moles. Be sure to tell your health care provider:  About any new moles or changes in moles, especially if there is a change in a mole's shape or color.  If you have a mole that is larger than the size of a pencil eraser.  If any of your family members has a history of skin cancer, especially at a young age, talk with your health care provider about genetic screening.  Always use sunscreen. Apply sunscreen liberally and repeatedly throughout the day.  Whenever you are outside, protect yourself by wearing long sleeves, pants, a wide-brimmed hat, and sunglasses. WHAT SHOULD I KNOW ABOUT OSTEOPOROSIS? Osteoporosis is a condition in which bone destruction happens more quickly than new bone creation. After menopause, you may be at an increased risk for osteoporosis. To help prevent osteoporosis or the bone fractures that can happen because of osteoporosis, the following is recommended:  If you are 53-45 years old, get at least 1,000 mg of calcium and at least 600 mg of vitamin D per day.  If you are older than age 40 but younger than age 90, get at least 1,200 mg of calcium and at least 600 mg of vitamin D per day.  If you are older than age 60, get at least 1,200 mg of calcium and at least 800 mg of vitamin D per day. Smoking and excessive alcohol intake increase the risk of osteoporosis. Eat foods that are rich in calcium and vitamin D, and do weight-bearing exercises several times each week as directed by your health care provider. WHAT SHOULD I KNOW ABOUT HOW  MENOPAUSE AFFECTS [place]? Depression may occur at any age, but it is more common as you become older. Common symptoms of depression include:  Low or sad mood.  Changes in sleep patterns.  Changes in appetite or eating patterns.  Feeling an overall lack of motivation or enjoyment of activities that you previously enjoyed.  Frequent crying spells. Talk with your health care provider if you think that you are experiencing depression. WHAT SHOULD I KNOW ABOUT IMMUNIZATIONS? It is important that you get and maintain your immunizations. These include:  Tetanus, diphtheria, and pertussis (Tdap) booster vaccine.  Influenza every year before the flu season begins.  Pneumonia vaccine.  Shingles vaccine. Your health care provider may also recommend other immunizations.   This information is not intended to  replace advice given to you by your health care provider. Make sure you discuss any questions you have with your health care provider.   Document Released: 05/05/2005 Document Revised: 04/03/2014 Document Reviewed: 11/13/2013 Elsevier Interactive Patient Education [employer].

## 2016-02-03 NOTE — Progress Notes (Signed)
Patient ID: Ashlee Cox, female    DOB: 05-Dec-1950  Age: 65 y.o. MRN: [id number]  The patient is here for annual physical examination and management of other chronic and acute problems.  Due for PAP smear (2014) Mammogram normal 2017 march  Colonoscopy 2008 normal   The risk factors are reflected in the social history.  The roster of all physicians providing medical care to patient - is listed in the Snapshot section of the chart.  Activities of daily living:  The patient is 100% independent in all ADLs: dressing, toileting, feeding as well as independent mobility  Home safety : The patient has smoke detectors in the home. They wear seatbelts.  There are no firearms at home. There is no violence in the home.   There is no risks for hepatitis, STDs or HIV. There is no   history of blood transfusion. They have no travel history to infectious disease endemic areas of the world.  The patient has seen their dentist in the last six month. They have seen their eye doctor in the last year.   They do not  have excessive sun exposure. Discussed the need for sun protection: hats, long sleeves and use of sunscreen if there is significant sun exposure.   Diet: the importance of a healthy diet is discussed. She tries to follow a low GI diet due to history of diabetes mellitus Type 2 , which is diet controlled. .  The benefits of regular aerobic exercise were discussed. She does not exercise regularly .   Depression screen: there are no signs or vegative symptoms of depression- irritability, change in appetite, anhedonia, sadness/tearfullness.   The following portions of the patient's history were reviewed and updated as appropriate: allergies, current medications, past family history, past medical history,  past surgical history, past social history  and problem list.  Visual acuity was not assessed per patient preference since she has regular follow up with her ophthalmologist. Hearing and body mass  index were assessed and reviewed.   During the course of the visit the patient was educated and counseled about appropriate screening and preventive services including : fall prevention , diabetes screening, nutrition counseling, colorectal cancer screening, and recommended immunizations.    CC: The primary encounter diagnosis was Screening for cervical cancer. Diagnoses of Hyperlipidemia associated with type 2 diabetes mellitus ([place]), Diabetes mellitus without complication ([place]), Hyperlipidemia with target LDL less than 100, Controlled type 2 diabetes mellitus with diabetic nephropathy, without long-term current use of insulin ([place]), Encounter for preventive health examination, and Essential hypertension were also pertinent to this visit.   Some mild anxiety with occasional insomnia. ites a few emotional stressors due to family issues. Daughter Ashlee Cox has separated from husband due to infidelity of husband .  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 The  children have different fathers   She  feels generally well, is walking several times per week.  Does not check  blood sugars more than  once or twice per week .  BS have been under 130 fasting and < 150 post prandially.  Denies any recent hypoglyemic events.  Taking her medications as directed. Following a carbohydrate modified diet 6 days per week. Denies numbness, burning and tingling of extremities. Appetite is good.      History Ashlee Cox has a past medical history of Cyst, breast; H/O: rheumatic fever; History of colonoscopy (2010); Hyperlipidemia; Hypertension; and Menopause.  She has a past surgical history that includes Appendectomy (2006); Cholecystectomy (2006); Vaginal delivery; and Breast cyst aspiration (Left, 1999).   Her family history includes Cancer (age of onset: 56) in her mother; Diabetes in her father, mother, and sister; Heart disease (age of onset: 36) in  her mother; Stroke (age of onset: 26) in her father.She reports that she quit smoking about 10 years ago. Her smoking use included Cigarettes. She quit after 1.00 year of use. She has never used smokeless tobacco. She reports that she does not drink alcohol or use drugs.  Outpatient Medications Prior to Visit  Medication Sig Dispense Refill  . aspirin 81 MG tablet Take 81 mg by mouth daily.      . betamethasone dipropionate 0.05 % lotion Apply topically 2 (two) times daily. 60 mL 0  . Cholecalciferol (VITAMIN D-3) 1000 UNITS CAPS Take 1,000 Units by mouth daily.    Marland Kitchen docusate sodium (COLACE) 100 MG capsule Take 100 mg by mouth daily.    Marland Kitchen losartan (COZAAR) 100 MG tablet TAKE ONE TABLET BY MOUTH ONCE DAILY 90 tablet 1  . lovastatin (MEVACOR) 40 MG tablet TAKE ONE TABLET BY MOUTH AT BEDTIME 90 tablet 3  . Multiple Vitamin (MULTIVITAMIN) tablet Take 1 tablet by mouth daily.      Marland Kitchen omeprazole (PRILOSEC) 20 MG capsule TAKE ONE CAPSULE BY MOUTH ONCE DAILY 90 capsule 2   No facility-administered medications prior to visit.     Review of Systems   Patient denies headache, fevers, malaise, unintentional weight loss, skin rash, eye pain, sinus congestion and sinus pain, sore throat, dysphagia,  hemoptysis , cough, dyspnea, wheezing, chest pain, palpitations, orthopnea, edema, abdominal pain, nausea, melena, diarrhea, constipation, flank pain, dysuria, hematuria, urinary  Frequency, nocturia, numbness, tingling, seizures,  Focal weakness, Loss of consciousness,  Tremor,depression,  and suicidal ideation.      Objective:  BP 128/74 (BP Location: Left Arm, Patient Position: Sitting, Cuff Size: Normal)   Pulse 85   Temp 98.2 F (36.8 C) (Oral)   Resp 16   Ht 5' 6.25" (1.683 m)   Wt 197 lb 6 oz (89.5 kg)   SpO2 97%   BMI 31.62 kg/m   Physical Exam  General Appearance:    Alert, cooperative, no distress, appears stated age  Head:    Normocephalic, without obvious abnormality, atraumatic  Eyes:     PERRL, conjunctiva/corneas clear, EOM's intact, fundi    benign, both eyes  Ears:    Normal TM's and external ear canals, both ears  Nose:   Nares normal, septum midline, mucosa normal, no drainage    or sinus tenderness  Throat:   Lips, mucosa, and tongue normal; teeth and gums normal  Neck:   Supple, symmetrical, trachea midline, no adenopathy;    thyroid:  no enlargement/tenderness/nodules; no carotid   bruit or JVD  Back:     Symmetric, no curvature, ROM normal, no CVA tenderness  Lungs:     Clear to auscultation bilaterally, respirations unlabored  Chest Wall:    No tenderness or deformity   Heart:    Regular rate and rhythm, S1 and S2 normal, no murmur, rub   or gallop  Breast Exam:    No tenderness, masses, or nipple abnormality  Abdomen:     Soft, non-tender, bowel sounds active all four quadrants,    no masses, no organomegaly  Genitalia:    Pelvic: cervix normal in appearance, external genitalia normal, no adnexal masses or tenderness, no cervical motion  tenderness, rectovaginal septum normal, uterus normal size, shape, and consistency and vagina normal without discharge  Extremities:   Extremities normal, atraumatic, no cyanosis or edema  Pulses:   2+ and symmetric all extremities  Skin:   Skin color, texture, turgor normal, no rashes or lesions  Lymph nodes:   Cervical, supraclavicular, and axillary nodes normal  Neurologic:   CNII-XII intact, normal strength, sensation and reflexes    throughout      Assessment & Plan:   Problem List Items Addressed This Visit    Hypertension    Well controlled on current regimen which includes losartan. . Renal function stable, no changes today. Lab Results  Component Value Date   CREATININE 0.80 02/03/2016   Lab Results  Component Value Date   NA 140 02/03/2016   K 4.4 02/03/2016   CL 106 02/03/2016   CO2 23 02/03/2016         Hyperlipidemia with target LDL less than 100    Managed with lovastatin .  LFTS are normal. No  changes today .  Lab Results  Component Value Date   CHOL 168 02/03/2016   HDL 49.80 02/03/2016   LDLCALC 86 02/03/2016   LDLDIRECT 79.0 12/08/2015   TRIG 163.0 (H) 02/03/2016   CHOLHDL 3 02/03/2016    Lab Results  Component Value Date   ALT 22 02/03/2016   AST 18 02/03/2016   ALKPHOS 96 02/03/2016   BILITOT 0.7 02/03/2016         Relevant Orders   LDL cholesterol, direct   Encounter for preventive health examination    Annual comprehensive preventive exam was done as well as an evaluation and management of chronic conditions .  During the course of the visit the patient was educated and counseled about appropriate screening and preventive services including :  diabetes screening, lipid analysis with projected  10 year  risk for CAD , nutrition counseling, breast, cervical and colorectal cancer screening, and recommended immunizations.  Printed recommendations for health maintenance screenings was give      Controlled type 2 diabetes mellitus with diabetic nephropathy ([place])     well-controlled on diet alone .  hemoglobin A1c has been consistently at or  less than 7.0 . Patient is  up-to-date on eye exams and foot exam is normal today. Patient has no microalbuminuria ,  is tolerating aspirin and statin therapy for CAD risk reduction and on ACE/ARB for reduction in proteinuria.  I have addressed her  BMI and encouraged  Continued weight loss with goal of 10% of body weight over the next 6 months using a low glycemic index diet and adding participation in regular exercise a minimum of 5 days per week.   Lab Results  Component Value Date   HGBA1C 6.6 (H) 12/08/2015   Lab Results  Component Value Date   MICROALBUR <0.7 12/08/2015             Relevant Orders   Comprehensive metabolic panel   Hemoglobin A1c   Lipid panel   Microalbumin / creatinine urine ratio    Other Visit Diagnoses    Screening for cervical cancer    -  Primary   Relevant Orders   Cytology - PAP  (Completed)   Hyperlipidemia associated with type 2 diabetes mellitus ([place])       Relevant Orders   Lipid panel (Completed)   Diabetes mellitus without complication ([place])       Relevant Orders   Comprehensive metabolic panel (Completed)  I am having Ms. Rice maintain her aspirin, multivitamin, Vitamin D-3, docusate sodium, lovastatin, omeprazole, losartan, and betamethasone dipropionate.  No orders of the defined types were placed in this encounter.   There are no discontinued medications.  Follow-up: Return in about 6 months (around 08/02/2016).   [name], MD

## 2016-02-03 NOTE — Progress Notes (Signed)
Pre visit review using our clinic review tool, if applicable. No additional management support is needed unless otherwise documented below in the visit note. 

## 2016-02-04 LAB — CYTOLOGY - PAP
CHLAMYDIA, DNA PROBE: NEGATIVE
DIAGNOSIS: NEGATIVE
HPV: NOT DETECTED
Neisseria Gonorrhea: NEGATIVE
Trichomonas: NEGATIVE

## 2016-02-06 ENCOUNTER — Encounter: Payer: Self-pay | Admitting: Internal Medicine

## 2016-02-06 NOTE — Assessment & Plan Note (Signed)
Annual comprehensive preventive exam was done as well as an evaluation and management of chronic conditions .  During the course of the visit the patient was educated and counseled about appropriate screening and preventive services including :  diabetes screening, lipid analysis with projected  10 year  risk for CAD , nutrition counseling, breast, cervical and colorectal cancer screening, and recommended immunizations.  Printed recommendations for health maintenance screenings was give 

## 2016-02-06 NOTE — Assessment & Plan Note (Signed)
well-controlled on diet alone .  hemoglobin A1c has been consistently at or  less than 7.0 . Patient is  up-to-date on eye exams and foot exam is normal today. Patient has no microalbuminuria ,  is tolerating aspirin and statin therapy for CAD risk reduction and on ACE/ARB for reduction in proteinuria.  I have addressed her  BMI and encouraged  Continued weight loss with goal of 10% of body weight over the next 6 months using a low glycemic index diet and adding participation in regular exercise a minimum of 5 days per week.   Lab Results  Component Value Date   HGBA1C 6.6 (H) 12/08/2015   Lab Results  Component Value Date   MICROALBUR <0.7 12/08/2015

## 2016-02-06 NOTE — Assessment & Plan Note (Signed)
Well controlled on current regimen which includes losartan. . Renal function stable, no changes today. Lab Results  Component Value Date   CREATININE 0.80 02/03/2016   Lab Results  Component Value Date   NA 140 02/03/2016   K 4.4 02/03/2016   CL 106 02/03/2016   CO2 23 02/03/2016

## 2016-02-06 NOTE — Assessment & Plan Note (Signed)
Managed with lovastatin .  LFTS are normal. No changes today .  Lab Results  Component Value Date   CHOL 168 02/03/2016   HDL 49.80 02/03/2016   LDLCALC 86 02/03/2016   LDLDIRECT 79.0 12/08/2015   TRIG 163.0 (H) 02/03/2016   CHOLHDL 3 02/03/2016    Lab Results  Component Value Date   ALT 22 02/03/2016   AST 18 02/03/2016   ALKPHOS 96 02/03/2016   BILITOT 0.7 02/03/2016

## 2016-02-07 LAB — CERVICOVAGINAL ANCILLARY ONLY
Bacterial vaginitis: POSITIVE — AB
Candida vaginitis: NEGATIVE
HERPES (WINDOWPATH): NEGATIVE

## 2016-02-08 ENCOUNTER — Other Ambulatory Visit: Payer: Self-pay | Admitting: Internal Medicine

## 2016-02-08 ENCOUNTER — Encounter: Payer: Self-pay | Admitting: Internal Medicine

## 2016-02-08 DIAGNOSIS — B9689 Other specified bacterial agents as the cause of diseases classified elsewhere: Secondary | ICD-10-CM | POA: Insufficient documentation

## 2016-02-08 DIAGNOSIS — N760 Acute vaginitis: Principal | ICD-10-CM

## 2016-02-08 MED ORDER — METRONIDAZOLE 500 MG PO TABS: 500.0000 mg | ORAL_TABLET | Freq: Two times a day (BID) | ORAL | 0 refills | Status: DC

## 2016-02-08 NOTE — Progress Notes (Signed)
me

## 2016-02-09 ENCOUNTER — Telehealth: Payer: Self-pay

## 2016-02-09 NOTE — Telephone Encounter (Signed)
Flagyl rx faxed.

## 2016-03-01 ENCOUNTER — Other Ambulatory Visit: Payer: Self-pay | Admitting: Internal Medicine

## 2016-05-03 ENCOUNTER — Other Ambulatory Visit: Payer: Self-pay | Admitting: Internal Medicine

## 2016-05-04 ENCOUNTER — Other Ambulatory Visit: Payer: Self-pay | Admitting: Internal Medicine

## 2016-07-31 ENCOUNTER — Other Ambulatory Visit: Payer: Self-pay | Admitting: Internal Medicine

## 2016-08-02 ENCOUNTER — Encounter: Payer: Self-pay | Admitting: Internal Medicine

## 2016-08-02 ENCOUNTER — Ambulatory Visit (INDEPENDENT_AMBULATORY_CARE_PROVIDER_SITE_OTHER): Payer: BLUE CROSS/BLUE SHIELD | Admitting: Internal Medicine

## 2016-08-02 VITALS — BP 130/76 | HR 68 | Temp 98.0°F | Resp 15 | Ht 66.25 in | Wt 198.8 lb

## 2016-08-02 DIAGNOSIS — I10 Essential (primary) hypertension: Secondary | ICD-10-CM

## 2016-08-02 DIAGNOSIS — E113591 Type 2 diabetes mellitus with proliferative diabetic retinopathy without macular edema, right eye: Secondary | ICD-10-CM

## 2016-08-02 DIAGNOSIS — Z6832 Body mass index (BMI) 32.0-32.9, adult: Secondary | ICD-10-CM | POA: Diagnosis not present

## 2016-08-02 DIAGNOSIS — Z1239 Encounter for other screening for malignant neoplasm of breast: Secondary | ICD-10-CM

## 2016-08-02 DIAGNOSIS — E113559 Type 2 diabetes mellitus with stable proliferative diabetic retinopathy, unspecified eye: Secondary | ICD-10-CM

## 2016-08-02 DIAGNOSIS — E785 Hyperlipidemia, unspecified: Secondary | ICD-10-CM

## 2016-08-02 DIAGNOSIS — R7989 Other specified abnormal findings of blood chemistry: Secondary | ICD-10-CM

## 2016-08-02 DIAGNOSIS — R945 Abnormal results of liver function studies: Secondary | ICD-10-CM

## 2016-08-02 DIAGNOSIS — E6609 Other obesity due to excess calories: Secondary | ICD-10-CM | POA: Diagnosis not present

## 2016-08-02 DIAGNOSIS — Z1231 Encounter for screening mammogram for malignant neoplasm of breast: Secondary | ICD-10-CM

## 2016-08-02 DIAGNOSIS — E1121 Type 2 diabetes mellitus with diabetic nephropathy: Secondary | ICD-10-CM | POA: Diagnosis not present

## 2016-08-02 LAB — LIPID PANEL
CHOL/HDL RATIO: 3
Cholesterol: 140 mg/dL (ref 0–200)
HDL: 43 mg/dL (ref >39.00)
LDL Cholesterol: 66 mg/dL (ref 0–99)
NonHDL: 97.15
TRIGLYCERIDES: 156 mg/dL — AB (ref 0.0–149.0)
VLDL: 31.2 mg/dL (ref 0.0–40.0)

## 2016-08-02 LAB — LDL CHOLESTEROL, DIRECT: LDL DIRECT: 73 mg/dL

## 2016-08-02 LAB — COMPREHENSIVE METABOLIC PANEL
ALBUMIN: 4.5 g/dL (ref 3.5–5.2)
ALK PHOS: 96 U/L (ref 39–117)
ALT: 55 U/L — AB (ref 0–35)
AST: 40 U/L — AB (ref 0–37)
BILIRUBIN TOTAL: 0.6 mg/dL (ref 0.2–1.2)
BUN: 16 mg/dL (ref 6–23)
CALCIUM: 9.9 mg/dL (ref 8.4–10.5)
CO2: 26 meq/L (ref 19–32)
CREATININE: 0.98 mg/dL (ref 0.40–1.20)
Chloride: 105 mEq/L (ref 96–112)
GFR: 60.77 mL/min (ref >60.00)
Glucose, Bld: 140 mg/dL — ABNORMAL HIGH (ref 70–99)
Potassium: 4.5 mEq/L (ref 3.5–5.1)
Sodium: 139 mEq/L (ref 135–145)
TOTAL PROTEIN: 7.6 g/dL (ref 6.0–8.3)

## 2016-08-02 LAB — MICROALBUMIN / CREATININE URINE RATIO
CREATININE, U: 222.2 mg/dL
MICROALB/CREAT RATIO: 0.4 mg/g (ref 0.0–30.0)
Microalb, Ur: 0.8 mg/dL (ref 0.0–1.9)

## 2016-08-02 NOTE — Progress Notes (Signed)
Subjective:  Patient ID: Ashlee Cox, female    DOB: 03-07-1951  Age: 66 y.o. MRN: [id number]  CC: The primary encounter diagnosis was Breast cancer screening. 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  HPI Ashlee Cox presents for 6 month follow up on diabetes.  Patient has no complaints today.  Patient is following a low glycemic index diet and taking all prescribed medications regularly without side effects.  Fasting sugars have been under less than 140 most of the time and post prandials have been under 160 except on rare occasions. Patient is exercising about 3 times per week and intentionally trying to lose weight .  Patient has had an eye exam in the last 12 months and checks feet regularly for signs of infection.  Patient does not walk barefoot outside,  And denies an numbness tingling or burning in feet. Patient is up to date on all recommended vaccinations  4 lb wt loss in the first week of attending [employer].  Her personal goal is 150.  Started at 202    Walking 30 minutes  For exercise daily   Lab Results  Component Value Date   MICROALBUR 0.8 08/02/2016     Fasting today  Due for colonoscopy.   Next year.  Lab Results  Component Value Date   HGBA1C 6.6 (H) 12/08/2015     Outpatient Medications Prior to Visit  Medication Sig Dispense Refill  . aspirin 81 MG tablet Take  81 mg by mouth daily.      . Cholecalciferol (VITAMIN D-3) 1000 UNITS CAPS Take 1,000 Units by mouth daily.    Marland Kitchen docusate sodium (COLACE) 100 MG capsule Take 100 mg by mouth daily.    Marland Kitchen losartan (COZAAR) 100 MG tablet TAKE ONE TABLET BY MOUTH ONCE DAILY 90 tablet 0  . lovastatin (MEVACOR) 40 MG tablet TAKE ONE TABLET BY MOUTH AT BEDTIME 90 tablet 3  . Multiple Vitamin (MULTIVITAMIN) tablet Take 1 tablet by mouth daily.      Marland Kitchen omeprazole (PRILOSEC) 20 MG capsule TAKE ONE CAPSULE BY MOUTH ONCE DAILY 90 capsule 2  . betamethasone dipropionate 0.05 % lotion Apply topically 2 (two) times daily. (Patient not taking: Reported on 08/02/2016) 60 mL 0  . metroNIDAZOLE (FLAGYL) 500 MG tablet Take 1 tablet (500 mg total) by mouth 2 (two) times daily. (Patient not taking: Reported on 08/02/2016) 14 tablet 0   No facility-administered medications prior to visit.     Review of Systems;  Patient denies headache, fevers, malaise, unintentional weight loss, skin rash, eye pain, sinus congestion and sinus pain, sore throat, dysphagia,  hemoptysis , cough, dyspnea, wheezing, chest pain, palpitations, orthopnea, edema, abdominal pain, nausea, melena, diarrhea, constipation, flank pain, dysuria, hematuria, urinary  Frequency, nocturia, numbness, tingling, seizures,  Focal weakness, Loss of consciousness,  Tremor, insomnia, depression, anxiety, and suicidal ideation.      Objective:  BP 130/76 (BP Location: Left Arm, Patient Position: Sitting, Cuff Size: Large)   Pulse 68   Temp  98 F (36.7 C) (Oral)   Resp 15   Ht 5' 6.25" (1.683 m)   Wt 198 lb 12.8 oz (90.2 kg)   SpO2 98%   BMI 31.85 kg/m   BP Readings from Last 3 Encounters:  08/02/16 130/76  02/03/16 128/74  12/08/15 132/72    Wt Readings from Last 3 Encounters:  08/02/16 198 lb 12.8 oz (90.2 kg)  02/03/16 197 lb 6 oz (89.5 kg)  12/08/15 197 lb 3.2 oz (89.4 kg)    General appearance: alert, cooperative and appears stated age Ears: normal TM's  and external ear canals both ears Throat: lips, mucosa, and tongue normal; teeth and gums normal Neck: no adenopathy, no carotid bruit, supple, symmetrical, trachea midline and thyroid not enlarged, symmetric, no tenderness/mass/nodules Back: symmetric, no curvature. ROM normal. No CVA tenderness. Lungs: clear to auscultation bilaterally Heart: regular rate and rhythm, S1, S2 normal, no murmur, click, rub or gallop Abdomen: soft, non-tender; bowel sounds normal; no masses,  no organomegaly Pulses: 2+ and symmetric Skin: Skin color, texture, turgor normal. No rashes or lesions Lymph nodes: Cervical, supraclavicular, and axillary nodes normal.  Lab Results  Component Value Date   HGBA1C 6.6 (H) 12/08/2015   HGBA1C 6.7 (H) 06/04/2015   HGBA1C 6.7 (H) 05/26/2014    Lab Results  Component Value Date   CREATININE 0.98 08/02/2016   CREATININE 0.80 02/03/2016   CREATININE 0.82 12/08/2015    Lab Results  Component Value Date   GLUCOSE 140 (H) 08/02/2016   CHOL 140 08/02/2016   TRIG 156.0 (H) 08/02/2016   HDL 43.00 08/02/2016   LDLDIRECT 73.0 08/02/2016   LDLCALC 66 08/02/2016   ALT 55 (H) 08/02/2016   AST 40 (H) 08/02/2016   NA 139 08/02/2016   K 4.5 08/02/2016   CL 105 08/02/2016   CREATININE 0.98 08/02/2016   BUN 16 08/02/2016   CO2 26 08/02/2016   TSH 1.64 04/29/2013   HGBA1C 6.6 (H) 12/08/2015   MICROALBUR 0.8 08/02/2016    No results found.  Assessment & Plan:   Problem List Items Addressed This Visit    Obesity    I have addressed  BMI and recommended wt loss of 10% of body weigh over the next 6 months using a low glycemic index diet and regular exercise a minimum of 5 days per week.        Hypertension    Well controlled on current regimen. Renal function stable, no changes today.  Lab Results  Component Value Date   CREATININE 0.98 08/02/2016   Lab Results  Component Value Date   NA 139 08/02/2016   K 4.5 08/02/2016   CL 105 08/02/2016   CO2 26  08/02/2016         Relevant Orders   Comprehensive metabolic panel (Completed)   Hyperlipidemia with target LDL less than 100    Managed with lovastatin .  LFTS are MILDLY ELEVATED,  Will repeat in 3 weeks  Lab Results  Component Value Date   CHOL 140 08/02/2016   HDL 43.00 08/02/2016   LDLCALC 66 08/02/2016   LDLDIRECT 73.0 08/02/2016   TRIG 156.0 (H) 08/02/2016   CHOLHDL 3 08/02/2016    Lab Results  Component Value Date   ALT 55 (H) 08/02/2016   AST 40 (H) 08/02/2016   ALKPHOS 96 08/02/2016   BILITOT 0.6 08/02/2016         Relevant Orders   Lipid panel (Completed)   Elevated LFTs    Mild transaminitis  noted.  Patient has been taking a statin for over 2 years without problems.  Suspect fatty liver, Will repeat in 3 weeks       Relevant Orders   Hepatic function panel   DM type 2 (diabetes mellitus, type 2) ([place])     Historically well-controlled on diet alone .  hemoglobin A1c has been consistently at or  less than 7.0 . Patient is up-to-date on eye exams and foot exam is normal today. Patient has no microalbuminuria. Patient is tolerating statin therapy for CAD risk reduction and on ARB for renal protection and hypertension   Lab Results  Component Value Date   MICROALBUR 0.8 08/02/2016         Relevant Orders   Hemoglobin A1c    Other Visit Diagnoses    Breast cancer screening    -  Primary   Relevant Orders   MM SCREENING BREAST TOMO BILATERAL   Controlled type 2 diabetes mellitus with diabetic nephropathy, without long-term current use of insulin ([facility])         A total of 25 minutes of face to face time was spent with patient more than half of which was spent in counselling about the above mentioned conditions  and coordination of care   I have discontinued Ms. Cox's betamethasone dipropionate and metroNIDAZOLE. I am also having her maintain her aspirin, multivitamin, Vitamin D-3, docusate sodium, lovastatin, omeprazole, losartan, and Cinnamon.  Meds  ordered this encounter  Medications  . Cinnamon 500 MG TABS    Sig: Take by mouth.    Medications Discontinued During This Encounter  Medication Reason  . betamethasone dipropionate 0.05 % lotion Patient has not taken in last 30 days  . metroNIDAZOLE (FLAGYL) 500 MG tablet Patient has not taken in last 30 days    Follow-up: Return in about 6 months (around 02/02/2017) for follow up diabetes.   [name], MD

## 2016-08-02 NOTE — Patient Instructions (Signed)
Your are doing great!  I I'll see you in 6 months, (sooner if needed)    Diabetes Mellitus and Standards of Medical Care Managing diabetes (diabetes mellitus) can be complicated. Your diabetes treatment may be managed by a team of health care providers, including:  A diet and nutrition specialist (registered dietitian).  A nurse.  A certified diabetes educator (CDE).  A diabetes specialist (endocrinologist).  An eye doctor.  A primary care provider.  A dentist. Your health care providers follow a schedule in order to help you get the best quality of care. The following schedule is a general guideline for your diabetes management plan. Your health care providers may also give you more specific instructions. HbA1c ( hemoglobin A1c) test This test provides information about blood sugar (glucose) control over the previous 2-3 months. It is used to check whether your diabetes management plan needs to be adjusted.  If you are meeting your treatment goals, this test is done at least 2 times a year.  If you are not meeting treatment goals or if your treatment goals have changed, this test is done 4 times a year. Blood pressure test  This test is done at every routine medical visit. For most people, the goal is less than 130/80. Ask your health care provider what your goal blood pressure should be. Dental and eye exams  Visit your dentist two times a year.  If you have type 1 diabetes, get an eye exam 3-5 years after you are diagnosed, and then once a year after your first exam.  If you were diagnosed with type 1 diabetes as a child, get an eye exam when you are age 21 or older and have had diabetes for 3-5 years. After the first exam, you should get an eye exam once a year.  If you have type 2 diabetes, have an eye exam as soon as you are diagnosed, and then once a year after your first exam. Foot care exam  Visual foot exams are done at every routine medical visit. The exams  check for cuts, bruises, redness, blisters, sores, or other problems with the feet.  A complete foot exam is done by your health care provider once a year. This exam includes an inspection of the structure and skin of your feet, and a check of the pulses and sensation in your feet.  Type 1 diabetes: Get your first exam 3-5 years after diagnosis.  Type 2 diabetes: Get your first exam as soon as you are diagnosed.  Check your feet every day for cuts, bruises, redness, blisters, or sores. If you have any of these or other problems that are not healing, contact your health care provider. Kidney function test ( urine microalbumin)  This test is done once a year.  Type 1 diabetes: Get your first test 5 years after diagnosis.  Type 2 diabetes: Get your first test as soon as you are diagnosed.  If you have chronic kidney disease (CKD), get a serum creatinine and estimated glomerular filtration rate (eGFR) test once a year. Lipid profile (cholesterol, HDL, LDL, triglycerides)  This test should be done when you are diagnosed with diabetes, and every 5 years after the first test. If you are on medicines to lower your cholesterol, you may need to get this test done every year.  The goal for LDL is less than 100 mg/dL (5.5 mmol/L). If you are at high risk, the goal is less than 70 mg/dL (3.9 mmol/L).    The  goal for triglycerides is less than 150 mg/dL (8.3 mmol/L). Immunizations  The yearly flu (influenza) vaccine is recommended for everyone 6 months or older who has diabetes.  The pneumonia (pneumococcal) vaccine is recommended for everyone 2 years or older who has diabetes. If you are 72 or older, you may get the pneumonia vaccine as a series of two separate shots.  The hepatitis B vaccine is recommended for adults shortly after they have been diagnosed with diabetes.     Mental and emotional health  Screening for symptoms of eating disorders, anxiety, and depression is recommended at  the time of diagnosis and afterward as needed. If your screening shows that you have symptoms (you have a positive screening result), you may need further evaluation and be referred to a mental health care provider. Diabetes self-management education  Education about how to manage your diabetes is recommended at diagnosis and ongoing as needed. Treatment plan  Your treatment plan will be reviewed at every medical visit. Summary  Managing diabetes (diabetes mellitus) can be complicated. Your diabetes treatment may be managed by a team of health care providers.  Your health care providers follow a schedule in order to help you get the best quality of care.  Standards of care including having regular physical exams, blood tests, blood pressure monitoring, immunizations, screening tests, and education about how to manage your diabetes.  Your health care providers may also give you more specific instructions based on your individual health. This information is not intended to replace advice given to you by your health care provider. Make sure you discuss any questions you have with your health care provider. Document Released: 01/08/2009 Document Revised: 12/10/2015 Document Reviewed: 12/10/2015 Elsevier Interactive Patient Education  2017 [employer].

## 2016-08-05 ENCOUNTER — Encounter: Payer: Self-pay | Admitting: Internal Medicine

## 2016-08-05 DIAGNOSIS — R945 Abnormal results of liver function studies: Secondary | ICD-10-CM

## 2016-08-05 DIAGNOSIS — R7989 Other specified abnormal findings of blood chemistry: Secondary | ICD-10-CM | POA: Insufficient documentation

## 2016-08-05 NOTE — Assessment & Plan Note (Signed)
Mild transaminitis noted.  Patient has been taking a statin for over 2 years without problems.  Suspect fatty liver, Will repeat in 3 weeks

## 2016-08-05 NOTE — Assessment & Plan Note (Signed)
Well controlled on current regimen. Renal function stable, no changes today.  Lab Results  Component Value Date   CREATININE 0.98 08/02/2016   Lab Results  Component Value Date   NA 139 08/02/2016   K 4.5 08/02/2016   CL 105 08/02/2016   CO2 26 08/02/2016

## 2016-08-05 NOTE — Assessment & Plan Note (Addendum)
Managed with lovastatin .  LFTS are MILDLY ELEVATED,  Will repeat in 3 weeks  Lab Results  Component Value Date   CHOL 140 08/02/2016   HDL 43.00 08/02/2016   LDLCALC 66 08/02/2016   LDLDIRECT 73.0 08/02/2016   TRIG 156.0 (H) 08/02/2016   CHOLHDL 3 08/02/2016    Lab Results  Component Value Date   ALT 55 (H) 08/02/2016   AST 40 (H) 08/02/2016   ALKPHOS 96 08/02/2016   BILITOT 0.6 08/02/2016

## 2016-08-05 NOTE — Assessment & Plan Note (Signed)
Historically well-controlled on diet alone .  hemoglobin A1c has been consistently at or  less than 7.0 . Patient is up-to-date on eye exams and foot exam is normal today. Patient has no microalbuminuria. Patient is tolerating statin therapy for CAD risk reduction and on ARB for renal protection and hypertension   Lab Results  Component Value Date   MICROALBUR 0.8 08/02/2016

## 2016-08-05 NOTE — Assessment & Plan Note (Signed)
I have addressed  BMI and recommended wt loss of 10% of body weigh over the next 6 months using a low glycemic index diet and regular exercise a minimum of 5 days per week.   

## 2016-08-07 ENCOUNTER — Encounter: Payer: Self-pay | Admitting: Internal Medicine

## 2016-08-07 LAB — HEMOGLOBIN A1C: Hgb A1c MFr Bld: 7.1 % — ABNORMAL HIGH (ref 4.6–6.5)

## 2016-08-07 NOTE — Addendum Note (Signed)
Addended by: Leeanne Rio on: 08/07/2016 02:31 PM   Modules accepted: Orders

## 2016-08-08 ENCOUNTER — Encounter: Payer: Self-pay | Admitting: Internal Medicine

## 2016-08-09 ENCOUNTER — Encounter: Payer: Self-pay | Admitting: Internal Medicine

## 2016-08-31 ENCOUNTER — Other Ambulatory Visit (INDEPENDENT_AMBULATORY_CARE_PROVIDER_SITE_OTHER): Payer: BLUE CROSS/BLUE SHIELD

## 2016-08-31 ENCOUNTER — Other Ambulatory Visit: Payer: BLUE CROSS/BLUE SHIELD

## 2016-08-31 DIAGNOSIS — R945 Abnormal results of liver function studies: Principal | ICD-10-CM

## 2016-08-31 DIAGNOSIS — R7989 Other specified abnormal findings of blood chemistry: Secondary | ICD-10-CM | POA: Diagnosis not present

## 2016-08-31 LAB — HEPATIC FUNCTION PANEL
ALBUMIN: 4.6 g/dL (ref 3.5–5.2)
ALT: 25 U/L (ref 0–35)
AST: 20 U/L (ref 0–37)
Alkaline Phosphatase: 83 U/L (ref 39–117)
Bilirubin, Direct: 0.2 mg/dL (ref 0.0–0.3)
Total Bilirubin: 0.7 mg/dL (ref 0.2–1.2)
Total Protein: 7.7 g/dL (ref 6.0–8.3)

## 2016-09-01 ENCOUNTER — Ambulatory Visit
Admission: RE | Admit: 2016-09-01 | Discharge: 2016-09-01 | Disposition: A | Discharge status: Home / Self Care | Payer: BLUE CROSS/BLUE SHIELD | Source: Ambulatory Visit | Attending: Internal Medicine | Admitting: Internal Medicine

## 2016-09-01 DIAGNOSIS — Z1239 Encounter for other screening for malignant neoplasm of breast: Secondary | ICD-10-CM

## 2016-09-01 DIAGNOSIS — Z1231 Encounter for screening mammogram for malignant neoplasm of breast: Secondary | ICD-10-CM | POA: Insufficient documentation

## 2016-09-04 ENCOUNTER — Encounter: Payer: Self-pay | Admitting: Internal Medicine

## 2016-11-07 ENCOUNTER — Telehealth: Payer: Self-pay | Admitting: Radiology

## 2016-11-07 DIAGNOSIS — R945 Abnormal results of liver function studies: Principal | ICD-10-CM

## 2016-11-07 DIAGNOSIS — R7989 Other specified abnormal findings of blood chemistry: Secondary | ICD-10-CM

## 2016-11-07 DIAGNOSIS — E113559 Type 2 diabetes mellitus with stable proliferative diabetic retinopathy, unspecified eye: Secondary | ICD-10-CM

## 2016-11-07 NOTE — Telephone Encounter (Signed)
Pt coming tomorrow for labs please place future orders. Thank you.

## 2016-11-07 NOTE — Addendum Note (Signed)
Addended by: [name] on: 11/07/2016 09:01 AM   Modules accepted: Orders

## 2016-11-07 NOTE — Telephone Encounter (Addendum)
Fasting labs and urine testing  added

## 2016-11-08 ENCOUNTER — Other Ambulatory Visit (INDEPENDENT_AMBULATORY_CARE_PROVIDER_SITE_OTHER): Payer: BLUE CROSS/BLUE SHIELD

## 2016-11-08 DIAGNOSIS — E113559 Type 2 diabetes mellitus with stable proliferative diabetic retinopathy, unspecified eye: Secondary | ICD-10-CM | POA: Diagnosis not present

## 2016-11-08 LAB — COMPREHENSIVE METABOLIC PANEL
ALBUMIN: 4.6 g/dL (ref 3.5–5.2)
ALK PHOS: 78 U/L (ref 39–117)
ALT: 15 U/L (ref 0–35)
AST: 17 U/L (ref 0–37)
BUN: 21 mg/dL (ref 6–23)
CO2: 27 mEq/L (ref 19–32)
Calcium: 10 mg/dL (ref 8.4–10.5)
Chloride: 105 mEq/L (ref 96–112)
Creatinine, Ser: 0.9 mg/dL (ref 0.40–1.20)
GFR: 66.99 mL/min (ref >60.00)
GLUCOSE: 139 mg/dL — AB (ref 70–99)
POTASSIUM: 4.7 meq/L (ref 3.5–5.1)
Sodium: 139 mEq/L (ref 135–145)
TOTAL PROTEIN: 7.1 g/dL (ref 6.0–8.3)
Total Bilirubin: 0.5 mg/dL (ref 0.2–1.2)

## 2016-11-08 LAB — LIPID PANEL
CHOLESTEROL: 146 mg/dL (ref 0–200)
HDL: 45.7 mg/dL (ref >39.00)
LDL Cholesterol: 72 mg/dL (ref 0–99)
NonHDL: 99.97
TRIGLYCERIDES: 141 mg/dL (ref 0.0–149.0)
Total CHOL/HDL Ratio: 3
VLDL: 28.2 mg/dL (ref 0.0–40.0)

## 2016-11-08 LAB — MICROALBUMIN / CREATININE URINE RATIO
Creatinine,U: 23.4 mg/dL
Microalb Creat Ratio: 3 mg/g (ref 0.0–30.0)
Microalb, Ur: <0.7 mg/dL (ref 0.0–1.9)

## 2016-11-08 LAB — HEMOGLOBIN A1C: Hgb A1c MFr Bld: 6.5 % (ref 4.6–6.5)

## 2016-11-11 ENCOUNTER — Encounter: Payer: Self-pay | Admitting: Internal Medicine

## 2016-11-30 ENCOUNTER — Other Ambulatory Visit: Payer: Self-pay | Admitting: Internal Medicine

## 2017-01-11 LAB — HM DIABETES EYE EXAM

## 2017-01-23 ENCOUNTER — Encounter: Payer: Self-pay | Admitting: Internal Medicine

## 2017-02-02 ENCOUNTER — Encounter: Payer: Self-pay | Admitting: Internal Medicine

## 2017-02-02 ENCOUNTER — Ambulatory Visit: Payer: BLUE CROSS/BLUE SHIELD | Admitting: Internal Medicine

## 2017-02-02 VITALS — BP 148/72 | HR 70 | Temp 97.5°F | Resp 15 | Ht 66.25 in | Wt 172.8 lb

## 2017-02-02 DIAGNOSIS — E113559 Type 2 diabetes mellitus with stable proliferative diabetic retinopathy, unspecified eye: Secondary | ICD-10-CM | POA: Diagnosis not present

## 2017-02-02 DIAGNOSIS — E785 Hyperlipidemia, unspecified: Secondary | ICD-10-CM | POA: Diagnosis not present

## 2017-02-02 DIAGNOSIS — I10 Essential (primary) hypertension: Secondary | ICD-10-CM

## 2017-02-02 DIAGNOSIS — Z23 Encounter for immunization: Secondary | ICD-10-CM

## 2017-02-02 MED ORDER — LOSARTAN POTASSIUM-HCTZ 50-12.5 MG PO TABS: 1.0000 | ORAL_TABLET | Freq: Every day | ORAL | 3 refills | Status: DC

## 2017-02-02 NOTE — Patient Instructions (Signed)
  CONGRATULATIONS!    YOU LOOK FANTASTIC!   I have changed your losartan 100 mg dose to losartan 50/12.5 mg (lower dose of losartan,  Plus 12.5 mg hctz)    It is too early to check you labs today,  So please return on or after Nov 15th   Lab Results  Component Value Date   HGBA1C 6.5 11/08/2016

## 2017-02-02 NOTE — Progress Notes (Signed)
Subjective:  Patient ID: Ashlee Cox, female    DOB: 08/19/1950  Age: 66 y.o. 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 Diagnoses of Need for immunization against influenza, Essential hypertension, and Hyperlipidemia with target LDL less than 100 were also pertinent to this visit.  HPI Ashlee Cox presents for follow up on type 2 diabetes, hypertension, hyperlipidemia and obesity  6 month follow up. She has had a  32  lb weight loss since April by following the Weight Watchers program .  Her goal is to lose 50 lbs .  She is not exercising regularly, because she works at a physically demanding job packing meat at [employer] having presyncope with weight loss, and noted that her blood pressure was dropping, so she  has reduced her losartan dose   Having trouble curbing her appetite at night.  And has gained 2 lbs.  Snacking on nuts and animal crackers.   Fasting today   No bowel or bladder issues.  Sleeping great.  She has been having some left lateral knee pain that wakes her up at night   Eye exam done . Lab Results  Component Value Date   MICROALBUR <0.7 11/08/2016        Outpatient Medications Prior to Visit  Medication Sig Dispense Refill  . aspirin 81 MG tablet Take 81 mg by mouth daily.      . Cholecalciferol (VITAMIN D-3) 1000 UNITS CAPS Take 1,000 Units by mouth daily.    . Cinnamon 500 MG TABS Take by mouth.    . docusate sodium (COLACE) 100 MG capsule Take 100 mg by mouth daily.    Marland Kitchen lovastatin (MEVACOR) 40 MG tablet TAKE ONE TABLET BY MOUTH AT BEDTIME 90 tablet 3  . Multiple Vitamin (MULTIVITAMIN) tablet Take 1 tablet by mouth daily.      Marland Kitchen omeprazole (PRILOSEC) 20 MG capsule TAKE ONE CAPSULE BY MOUTH ONCE DAILY 90 capsule 2  . losartan (COZAAR) 100 MG tablet TAKE ONE TABLET BY MOUTH ONCE DAILY 90 tablet 0    . losartan (COZAAR) 100 MG tablet TAKE ONE TABLET BY MOUTH ONCE DAILY (Patient not taking: Reported on 02/02/2017) 90 tablet 0   No facility-administered medications prior to visit.     Review of Systems;  Patient denies headache, fevers, malaise, unintentional weight loss, skin rash, eye pain, sinus congestion and sinus pain, sore throat, dysphagia,  hemoptysis , cough, dyspnea, wheezing, chest pain, palpitations, orthopnea, edema, abdominal pain, nausea, melena, diarrhea, constipation, flank pain, dysuria, hematuria, urinary  Frequency, nocturia, numbness, tingling, seizures,  Focal weakness, Loss of consciousness,  Tremor, insomnia, depression, anxiety, and suicidal ideation.      Objective:  BP (!) 148/72 (BP Location: Left Arm, Patient Position: Sitting, Cuff Size: Normal)   Pulse 70   Temp (!) 97.5 F (36.4 C) (Oral)   Resp 15   Ht 5' 6.25" (1.683 m)   Wt 172 lb 12.8 oz (78.4 kg)   SpO2 99%   BMI 27.68 kg/m   BP Readings from Last 3 Encounters:  02/02/17 (!) 148/72  08/02/16 130/76  02/03/16 128/74    Wt Readings from Last 3 Encounters:  02/02/17 172 lb 12.8 oz (78.4 kg)  08/02/16 198 lb 12.8 oz (90.2 kg)  02/03/16 197 lb 6 oz (89.5 kg)    General appearance: alert, cooperative and appears stated age Ears: normal  TM's and external ear canals both ears Throat: lips, mucosa, and tongue normal; teeth and gums normal Neck: no adenopathy, no carotid bruit, supple, symmetrical, trachea midline and thyroid not enlarged, symmetric, no tenderness/mass/nodules Back: symmetric, no curvature. ROM normal. No CVA tenderness. Lungs: clear to auscultation bilaterally Heart: regular rate and rhythm, S1, S2 normal, no murmur, click, rub or gallop Abdomen: soft, non-tender; bowel sounds normal; no masses,  no organomegaly Pulses: 2+ and symmetric Skin: Skin color, texture, turgor normal. No rashes or lesions Lymph nodes: Cervical, supraclavicular, and axillary nodes normal.  Lab  Results  Component Value Date   HGBA1C 6.5 11/08/2016   HGBA1C 7.1 (H) 08/02/2016   HGBA1C 6.6 (H) 12/08/2015    Lab Results  Component Value Date   CREATININE 0.90 11/08/2016   CREATININE 0.98 08/02/2016   CREATININE 0.80 02/03/2016    Lab Results  Component Value Date   GLUCOSE 139 (H) 11/08/2016   CHOL 146 11/08/2016   TRIG 141.0 11/08/2016   HDL 45.70 11/08/2016   LDLDIRECT 73.0 08/02/2016   LDLCALC 72 11/08/2016   ALT 15 11/08/2016   AST 17 11/08/2016   NA 139 11/08/2016   K 4.7 11/08/2016   CL 105 11/08/2016   CREATININE 0.90 11/08/2016   BUN 21 11/08/2016   CO2 27 11/08/2016   TSH 1.64 04/29/2013   HGBA1C 6.5 11/08/2016   MICROALBUR <0.7 11/08/2016    Mm Screening Breast Tomo Bilateral  Result Date: 09/01/2016 CLINICAL DATA:  Screening. EXAM: 2D DIGITAL SCREENING BILATERAL MAMMOGRAM WITH CAD AND ADJUNCT TOMO COMPARISON:  Previous exam(s). ACR Breast Density Category c: The breast tissue is heterogeneously dense, which may obscure small masses. FINDINGS: There are no findings suspicious for malignancy. Images were processed with CAD. IMPRESSION: No mammographic evidence of malignancy. A result letter of this screening mammogram will be mailed directly to the patient. RECOMMENDATION: Screening mammogram in one year. (Code:SM-B-01Y) BI-RADS CATEGORY  1: Negative. Electronically Signed   By: Ammie Ferrier M.D.   On: 09/01/2016 14:36    Assessment & Plan:   Problem List Items Addressed This Visit    DM type 2 (diabetes mellitus, type 2) ([place]) - Primary     Historically well-controlled on diet alone .  hemoglobin A1c has been consistently at or  less than 7.0 . She will return for repeat assessement with labs in a few weeks.  Patient is up-to-date on eye exams and foot exam is normal today. Patient has no microalbuminuria. Patient is tolerating statin therapy for CAD risk reduction and on ARB for renal protection and hypertension   Lab Results  Component Value Date     MICROALBUR <0.7 11/08/2016   Lab Results  Component Value Date   HGBA1C 6.5 11/08/2016          Relevant Medications   losartan-hydrochlorothiazide (HYZAAR) 50-12.5 MG tablet   Other Relevant Orders   Comprehensive metabolic panel   Lipid panel   Hemoglobin A1c   Hyperlipidemia with target LDL less than 100    Managed with lovastatin . She will return in a few weeks.  Lab Results  Component Value Date   CHOL 146 11/08/2016   HDL 45.70 11/08/2016   LDLCALC 72 11/08/2016   LDLDIRECT 73.0 08/02/2016   TRIG 141.0 11/08/2016   CHOLHDL 3 11/08/2016    Lab Results  Component Value Date   ALT 15 11/08/2016   AST 17 11/08/2016   ALKPHOS 78 11/08/2016   BILITOT 0.5 11/08/2016  Relevant Medications   losartan-hydrochlorothiazide (HYZAAR) 50-12.5 MG tablet   Hypertension    Improved control with weight loss .  Reducing her dose today to 50 mg and adding 12.5 mg hctz       Relevant Medications   losartan-hydrochlorothiazide (HYZAAR) 50-12.5 MG tablet    Other Visit Diagnoses    Need for immunization against influenza       Relevant Orders   Flu Vaccine QUAD 36+ mos IM (Completed)     A total of 25 minutes of face to face time was spent with patient more than half of which was spent in counselling about the above mentioned conditions  and coordination of care  I have discontinued Kalynne A. Cox's losartan and losartan. I am also having her start on losartan-hydrochlorothiazide. Additionally, I am having her maintain her aspirin, multivitamin, Vitamin D-3, docusate sodium, lovastatin, omeprazole, and Cinnamon.  Meds ordered this encounter  Medications  . losartan-hydrochlorothiazide (HYZAAR) 50-12.5 MG tablet    Sig: Take 1 tablet daily by mouth.    Dispense:  90 tablet    Refill:  3    Medications Discontinued During This Encounter  Medication Reason  . losartan (COZAAR) 962 MG tablet Duplicate  . losartan (COZAAR) 100 MG tablet     Follow-up: No  Follow-up on file.   [name], MD

## 2017-02-03 ENCOUNTER — Other Ambulatory Visit: Payer: Self-pay | Admitting: Internal Medicine

## 2017-02-04 NOTE — Assessment & Plan Note (Signed)
Improved control with weight loss .  Reducing her dose today to 50 mg and adding 12.5 mg hctz

## 2017-02-04 NOTE — Assessment & Plan Note (Signed)
Managed with lovastatin . She will return in a few weeks.  Lab Results  Component Value Date   CHOL 146 11/08/2016   HDL 45.70 11/08/2016   LDLCALC 72 11/08/2016   LDLDIRECT 73.0 08/02/2016   TRIG 141.0 11/08/2016   CHOLHDL 3 11/08/2016    Lab Results  Component Value Date   ALT 15 11/08/2016   AST 17 11/08/2016   ALKPHOS 78 11/08/2016   BILITOT 0.5 11/08/2016

## 2017-02-04 NOTE — Assessment & Plan Note (Signed)
Historically well-controlled on diet alone .  hemoglobin A1c has been consistently at or  less than 7.0 . She will return for repeat assessement with labs in a few weeks.  Patient is up-to-date on eye exams and foot exam is normal today. Patient has no microalbuminuria. Patient is tolerating statin therapy for CAD risk reduction and on ARB for renal protection and hypertension   Lab Results  Component Value Date   MICROALBUR <0.7 11/08/2016   Lab Results  Component Value Date   HGBA1C 6.5 11/08/2016

## 2017-02-09 ENCOUNTER — Other Ambulatory Visit: Payer: BLUE CROSS/BLUE SHIELD

## 2017-02-20 ENCOUNTER — Other Ambulatory Visit (INDEPENDENT_AMBULATORY_CARE_PROVIDER_SITE_OTHER): Payer: BLUE CROSS/BLUE SHIELD

## 2017-02-20 DIAGNOSIS — E113559 Type 2 diabetes mellitus with stable proliferative diabetic retinopathy, unspecified eye: Secondary | ICD-10-CM | POA: Diagnosis not present

## 2017-02-20 LAB — COMPREHENSIVE METABOLIC PANEL
ALBUMIN: 4.3 g/dL (ref 3.5–5.2)
ALK PHOS: 89 U/L (ref 39–117)
ALT: 17 U/L (ref 0–35)
AST: 18 U/L (ref 0–37)
BUN: 18 mg/dL (ref 6–23)
CO2: 29 mEq/L (ref 19–32)
CREATININE: 0.83 mg/dL (ref 0.40–1.20)
Calcium: 10.1 mg/dL (ref 8.4–10.5)
Chloride: 103 mEq/L (ref 96–112)
GFR: 73.49 mL/min (ref >60.00)
Glucose, Bld: 146 mg/dL — ABNORMAL HIGH (ref 70–99)
POTASSIUM: 4.5 meq/L (ref 3.5–5.1)
SODIUM: 140 meq/L (ref 135–145)
TOTAL PROTEIN: 7.6 g/dL (ref 6.0–8.3)
Total Bilirubin: 0.7 mg/dL (ref 0.2–1.2)

## 2017-02-20 LAB — LIPID PANEL
CHOL/HDL RATIO: 4
Cholesterol: 178 mg/dL (ref 0–200)
HDL: 49.9 mg/dL (ref >39.00)
LDL CALC: 94 mg/dL (ref 0–99)
NonHDL: 128.46
TRIGLYCERIDES: 170 mg/dL — AB (ref 0.0–149.0)
VLDL: 34 mg/dL (ref 0.0–40.0)

## 2017-02-20 LAB — HEMOGLOBIN A1C: Hgb A1c MFr Bld: 6.3 % (ref 4.6–6.5)

## 2017-02-25 ENCOUNTER — Encounter: Payer: Self-pay | Admitting: Internal Medicine

## 2017-02-25 ENCOUNTER — Other Ambulatory Visit: Payer: Self-pay | Admitting: Internal Medicine

## 2017-02-25 MED ORDER — LOVASTATIN 40 MG PO TABS: 40.0000 mg | ORAL_TABLET | Freq: Every day | ORAL | 3 refills | Status: DC

## 2017-03-01 ENCOUNTER — Other Ambulatory Visit: Payer: Self-pay | Admitting: Internal Medicine

## 2017-03-09 ENCOUNTER — Other Ambulatory Visit: Payer: Self-pay | Admitting: Internal Medicine

## 2017-03-12 ENCOUNTER — Other Ambulatory Visit: Payer: Self-pay | Admitting: Internal Medicine

## 2017-03-22 ENCOUNTER — Other Ambulatory Visit: Payer: Self-pay

## 2017-03-22 MED ORDER — LOVASTATIN 40 MG PO TABS: 40.0000 mg | ORAL_TABLET | Freq: Every day | ORAL | 3 refills | Status: DC

## 2017-08-02 ENCOUNTER — Ambulatory Visit: Payer: BLUE CROSS/BLUE SHIELD | Admitting: Internal Medicine

## 2017-08-02 ENCOUNTER — Telehealth: Payer: Self-pay

## 2017-08-02 ENCOUNTER — Encounter: Payer: Self-pay | Admitting: Internal Medicine

## 2017-08-02 VITALS — BP 112/74 | HR 66 | Temp 97.9°F | Resp 14 | Ht 66.25 in | Wt 170.8 lb

## 2017-08-02 DIAGNOSIS — E785 Hyperlipidemia, unspecified: Secondary | ICD-10-CM | POA: Diagnosis not present

## 2017-08-02 DIAGNOSIS — Z79899 Other long term (current) drug therapy: Secondary | ICD-10-CM

## 2017-08-02 DIAGNOSIS — K5901 Slow transit constipation: Secondary | ICD-10-CM | POA: Diagnosis not present

## 2017-08-02 DIAGNOSIS — Z1231 Encounter for screening mammogram for malignant neoplasm of breast: Secondary | ICD-10-CM

## 2017-08-02 DIAGNOSIS — Z1239 Encounter for other screening for malignant neoplasm of breast: Secondary | ICD-10-CM

## 2017-08-02 DIAGNOSIS — I10 Essential (primary) hypertension: Secondary | ICD-10-CM

## 2017-08-02 DIAGNOSIS — E1100 Type 2 diabetes mellitus with hyperosmolarity without nonketotic hyperglycemic-hyperosmolar coma (NKHHC): Secondary | ICD-10-CM | POA: Diagnosis not present

## 2017-08-02 DIAGNOSIS — E6609 Other obesity due to excess calories: Secondary | ICD-10-CM | POA: Diagnosis not present

## 2017-08-02 DIAGNOSIS — Z6832 Body mass index (BMI) 32.0-32.9, adult: Secondary | ICD-10-CM | POA: Diagnosis not present

## 2017-08-02 LAB — COMPREHENSIVE METABOLIC PANEL
ALT: 14 U/L (ref 0–35)
AST: 15 U/L (ref 0–37)
Albumin: 4.4 g/dL (ref 3.5–5.2)
Alkaline Phosphatase: 73 U/L (ref 39–117)
BILIRUBIN TOTAL: 0.7 mg/dL (ref 0.2–1.2)
BUN: 18 mg/dL (ref 6–23)
CALCIUM: 10.1 mg/dL (ref 8.4–10.5)
CO2: 28 meq/L (ref 19–32)
CREATININE: 0.76 mg/dL (ref 0.40–1.20)
Chloride: 103 mEq/L (ref 96–112)
GFR: 81.24 mL/min (ref >60.00)
GLUCOSE: 131 mg/dL — AB (ref 70–99)
Potassium: 4.1 mEq/L (ref 3.5–5.1)
Sodium: 140 mEq/L (ref 135–145)
Total Protein: 7.6 g/dL (ref 6.0–8.3)

## 2017-08-02 LAB — LIPID PANEL
CHOL/HDL RATIO: 3
Cholesterol: 157 mg/dL (ref 0–200)
HDL: 48.5 mg/dL (ref >39.00)
LDL Cholesterol: 82 mg/dL (ref 0–99)
NONHDL: 108.2
TRIGLYCERIDES: 130 mg/dL (ref 0.0–149.0)
VLDL: 26 mg/dL (ref 0.0–40.0)

## 2017-08-02 LAB — VITAMIN D 25 HYDROXY (VIT D DEFICIENCY, FRACTURES): VITD: 43.21 ng/mL (ref 30.00–100.00)

## 2017-08-02 LAB — HEMOGLOBIN A1C: Hgb A1c MFr Bld: 6.4 % (ref 4.6–6.5)

## 2017-08-02 LAB — MICROALBUMIN / CREATININE URINE RATIO
CREATININE, U: 42.4 mg/dL
Microalb Creat Ratio: 1.6 mg/g (ref 0.0–30.0)

## 2017-08-02 LAB — VITAMIN B12: VITAMIN B 12: 583 pg/mL (ref 211–911)

## 2017-08-02 MED ORDER — TELMISARTAN-HCTZ 40-12.5 MG PO TABS: 1.0000 | ORAL_TABLET | Freq: Every day | ORAL | 0 refills | Status: DC

## 2017-08-02 NOTE — Progress Notes (Signed)
Subjective:  Patient ID: Ashlee Cox, female    DOB: 23-Dec-1950  Age: 67 y.o. MRN: [id number]  CC: The primary encounter diagnosis was Hyperlipidemia with target LDL less than 100. Diagnoses of Essential hypertension, Type 2 diabetes mellitus with hyperosmolarity without coma, without long-term current use of insulin ([place]), Long-term use of high-risk medication, Breast cancer screening, Class 1 obesity due to excess calories with serious comorbidity and body mass index (BMI) of 32.0 to 32.9 in adult, and Slow transit constipation were also pertinent to this visit.  HPI Ashlee Cox presents for FOLLOW UP ON T2DM , diet controlled,  Hypertension and hyperlipidemia . Last seen Nov   Lost 26 lbs last year,  Down 2 more by today's weight.  Using weight watchers    COLONOSCOPY : DISCUSSED COLOGUARD SINCE SHE IS MEDICARE ELIGIBLE   3 month follow up on diabetes.  Patient has no complaints today.  Patient is following a low glycemic index diet and taking all prescribed medications regularly without side effects.  Fasting sugars have been under less than 140 most of the time and post prandials have been under 160 except on rare occasions. Patient is exercising about 3 times per week and intentionally trying to lose weight .  Patient has had an eye exam in the last 12 months and checks feet regularly for signs of infection.  Patient does not walk barefoot outside,  And denies an numbness tingling or burning in feet. Patient is up to date on all recommended vaccinations Foot exam done  Outpatient Medications Prior to Visit  Medication Sig Dispense Refill  . aspirin 81 MG tablet Take 81 mg by mouth daily.      . Cholecalciferol (VITAMIN D-3) 1000 UNITS CAPS Take 1,000 Units by mouth daily.    . Cinnamon 500 MG TABS Take by mouth.    . docusate sodium (COLACE) 100 MG capsule Take 100 mg by mouth daily.    Marland Kitchen lovastatin (MEVACOR) 40 MG tablet Take 1 tablet (40 mg total) by mouth at bedtime. 90 tablet 3    . Multiple Vitamin (MULTIVITAMIN) tablet Take 1 tablet by mouth daily.      Marland Kitchen omeprazole (PRILOSEC) 20 MG capsule TAKE 1 CAPSULE BY MOUTH ONCE DAILY 90 capsule 2  . losartan-hydrochlorothiazide (HYZAAR) 50-12.5 MG tablet Take 1 tablet daily by mouth. 90 tablet 3   No facility-administered medications prior to visit.     Review of Systems;  Patient denies headache, fevers, malaise, unintentional weight loss, skin rash, eye pain, sinus congestion and sinus pain, sore throat, dysphagia,  hemoptysis , cough, dyspnea, wheezing, chest pain, palpitations, orthopnea, edema, abdominal pain, nausea, melena, diarrhea, constipation, flank pain, dysuria, hematuria, urinary  Frequency, nocturia, numbness, tingling, seizures,  Focal weakness, Loss of consciousness,  Tremor, insomnia, depression, anxiety, and suicidal ideation.      Objective:  BP 112/74 (BP Location: Left Arm, Patient Position: Sitting, Cuff Size: Normal)   Pulse 66   Temp 97.9 F (36.6 C) (Oral)   Resp 14   Ht 5' 6.25" (1.683 m)   Wt 170 lb 12.8 oz (77.5 kg)   SpO2 97%   BMI 27.36 kg/m   BP Readings from Last 3 Encounters:  08/02/17 112/74  02/02/17 (!) 148/72  08/02/16 130/76    Wt Readings from Last 3 Encounters:  08/02/17 170 lb 12.8 oz (77.5 kg)  02/02/17 172 lb 12.8 oz (78.4 kg)  08/02/16 198 lb 12.8 oz (90.2 kg)    General appearance: alert,  cooperative and appears stated age Ears: normal TM's and external ear canals both ears Throat: lips, mucosa, and tongue normal; teeth and gums normal Neck: no adenopathy, no carotid bruit, supple, symmetrical, trachea midline and thyroid not enlarged, symmetric, no tenderness/mass/nodules Back: symmetric, no curvature. ROM normal. No CVA tenderness. Lungs: clear to auscultation bilaterally Heart: regular rate and rhythm, S1, S2 normal, no murmur, click, rub or gallop Abdomen: soft, non-tender; bowel sounds normal; no masses,  no organomegaly Pulses: 2+ and symmetric Skin:  Skin color, texture, turgor normal. No rashes or lesions Lymph nodes: Cervical, supraclavicular, and axillary nodes normal.  Lab Results  Component Value Date   HGBA1C 6.4 08/02/2017   HGBA1C 6.3 02/20/2017   HGBA1C 6.5 11/08/2016    Lab Results  Component Value Date   CREATININE 0.76 08/02/2017   CREATININE 0.83 02/20/2017   CREATININE 0.90 11/08/2016    Lab Results  Component Value Date   GLUCOSE 131 (H) 08/02/2017   CHOL 157 08/02/2017   TRIG 130.0 08/02/2017   HDL 48.50 08/02/2017   LDLDIRECT 73.0 08/02/2016   LDLCALC 82 08/02/2017   ALT 14 08/02/2017   AST 15 08/02/2017   NA 140 08/02/2017   K 4.1 08/02/2017   CL 103 08/02/2017   CREATININE 0.76 08/02/2017   BUN 18 08/02/2017   CO2 28 08/02/2017   TSH 1.64 04/29/2013   HGBA1C 6.4 08/02/2017   MICROALBUR <0.7 08/02/2017    Mm Screening Breast Tomo Bilateral  Result Date: 09/01/2016 CLINICAL DATA:  Screening. EXAM: 2D DIGITAL SCREENING BILATERAL MAMMOGRAM WITH CAD AND ADJUNCT TOMO COMPARISON:  Previous exam(s). ACR Breast Density Category c: The breast tissue is heterogeneously dense, which may obscure small masses. FINDINGS: There are no findings suspicious for malignancy. Images were processed with CAD. IMPRESSION: No mammographic evidence of malignancy. A result letter of this screening mammogram will be mailed directly to the patient. RECOMMENDATION: Screening mammogram in one year. (Code:SM-B-01Y) BI-RADS CATEGORY  1: Negative. Electronically Signed   By: Ammie Ferrier M.D.   On: 09/01/2016 14:36    Assessment & Plan:   Problem List Items Addressed This Visit    Hypertension   Relevant Orders   Comprehensive metabolic panel (Completed)   Hyperlipidemia with target LDL less than 100 - Primary   Relevant Orders   Lipid panel (Completed)   Obesity    I have congratulated her in reduction of   BMI and encouraged  Continued weight loss with goal of 10% of body weigh over the next 6 months using a low  glycemic index diet and regular exercise a minimum of 5 days per week.        DM type 2 (diabetes mellitus, type 2) ([place])     Historically well-controlled on diet alone .  hemoglobin A1c has been consistently at or  less than 7.0 . She will return for repeat assessement with labs in a few weeks.  Patient is up-to-date on eye exams and foot exam is normal today. Patient has no microalbuminuria. Patient is tolerating statin therapy for CAD risk reduction and on ARB for renal protection and hypertension   Lab Results  Component Value Date   MICROALBUR <0.7 08/02/2017   Lab Results  Component Value Date   HGBA1C 6.4 08/02/2017          Relevant Orders   Hemoglobin A1c (Completed)   Microalbumin / creatinine urine ratio (Completed)   Constipation     Encouraged to  increase the fiber in her diet to  25 g daily using Fruit and vegetables , [employer] and Atkins protein bars , and the low carb breads .  Also recommended adding  miralax, metamucil, fibercon, or citrucel daily to supplement her fiber. She was advised that she  can also combine them daily with colace,  Also,  make 4 16 ounce servings of water a daily goal        Other Visit Diagnoses    Long-term use of high-risk medication       Relevant Orders   VITAMIN D 25 Hydroxy (Vit-D Deficiency, Fractures) (Completed)   B12 (Completed)   Breast cancer screening       Relevant Orders   MM 3D SCREEN BREAST BILATERAL      I am having Lache A. Cox maintain her aspirin, multivitamin, Vitamin D-3, docusate sodium, Cinnamon, omeprazole, and lovastatin.  No orders of the defined types were placed in this encounter.   There are no discontinued medications.  Follow-up: No follow-ups on file.   [name], MD

## 2017-08-02 NOTE — Telephone Encounter (Signed)
No,  Changing to termisartan hct   rx sent

## 2017-08-02 NOTE — Telephone Encounter (Signed)
Received a fax from [facility] stating that the losartan/HCTZ is on back order. They are wanting to know if the rx can be split into two different rxs?

## 2017-08-02 NOTE — Patient Instructions (Addendum)
The following laxatives can be used  EVERY DAY SAFELY  To treat constipation    Stool softener ; (only one type available,  docusate )  Bulk forming laxatives  (MIRALAX,  METAMUCIL,  CITRUCEL BENEFIBER AND FIBERCON )   I also recommend a minimum of 60 ounces of water daily.  try to [place] [employer] OUNCES WATER AT BEDTIME AND IN THE  MORNING  BEFORE EATING    I will initiate the order for your colon cancer screening test when you return for your "welcome to medicare physical " .  It is called  Cologuard.  It will be delivered to your house, and you will send off a stool sample in the envelope it provides.

## 2017-08-03 ENCOUNTER — Other Ambulatory Visit: Payer: Self-pay | Admitting: Internal Medicine

## 2017-08-03 NOTE — Telephone Encounter (Signed)
Spoke with pharmacy and they stated that the telmisartan/hctz will cost the pt $300 a month because she does not have prescription insurance coverage. They stated that is why they asked if it would be okay to split into two different pills because it will cost the pt less.

## 2017-08-03 NOTE — Telephone Encounter (Signed)
Pt called back and she stated that she just picked up a rx of the losartan/hctz yesterday from [facility]. It was a 90 day supply and they filled it with a different manufacturer. Pharmacy did not tell me this when I spoke with them, they gave the impression that the pt was waiting on the rx.

## 2017-08-04 DIAGNOSIS — K5900 Constipation, unspecified: Secondary | ICD-10-CM | POA: Insufficient documentation

## 2017-08-04 NOTE — Assessment & Plan Note (Signed)
Encouraged to  increase the fiber in her diet to 25 g daily using Fruit and vegetables , [employer] and Atkins protein bars , and the low carb breads .  Also recommended adding  miralax, metamucil, fibercon, or citrucel daily to supplement her fiber. She was advised that she  can also combine them daily with colace,  Also,  make 4 16 ounce servings of water a daily goal  

## 2017-08-04 NOTE — Assessment & Plan Note (Signed)
I have congratulated her in reduction of   BMI and encouraged  Continued weight loss with goal of 10% of body weigh over the next 6 months using a low glycemic index diet and regular exercise a minimum of 5 days per week.    

## 2017-08-04 NOTE — Assessment & Plan Note (Signed)
Historically well-controlled on diet alone .  hemoglobin A1c has been consistently at or  less than 7.0 . She will return for repeat assessement with labs in a few weeks.  Patient is up-to-date on eye exams and foot exam is normal today. Patient has no microalbuminuria. Patient is tolerating statin therapy for CAD risk reduction and on ARB for renal protection and hypertension   Lab Results  Component Value Date   MICROALBUR <0.7 08/02/2017   Lab Results  Component Value Date   HGBA1C 6.4 08/02/2017

## 2017-09-19 ENCOUNTER — Ambulatory Visit
Admission: RE | Admit: 2017-09-19 | Discharge: 2017-09-19 | Disposition: A | Discharge status: Home / Self Care | Payer: BLUE CROSS/BLUE SHIELD | Source: Ambulatory Visit | Attending: Internal Medicine | Admitting: Internal Medicine

## 2017-09-19 DIAGNOSIS — Z1231 Encounter for screening mammogram for malignant neoplasm of breast: Secondary | ICD-10-CM | POA: Insufficient documentation

## 2017-09-19 DIAGNOSIS — Z1239 Encounter for other screening for malignant neoplasm of breast: Secondary | ICD-10-CM

## 2017-10-29 ENCOUNTER — Telehealth: Payer: Self-pay | Admitting: Internal Medicine

## 2017-10-29 ENCOUNTER — Other Ambulatory Visit: Payer: Self-pay | Admitting: Internal Medicine

## 2017-10-29 NOTE — Telephone Encounter (Signed)
Copied from [facility] [id number]. Topic: Quick Communication - Rx Refill/Question >> Oct 29, 2017 12:45 PM Rutherford Nail, NT wrote: **Patient states that she spoke with the pharmacist and was told that this medication was on back order. States that she does not want to be prescribed the telmasartan in place. It cost too much ($300). Please advise.**  Medication: losartan-hydrochlorothiazide (HYZAAR) 50-12.5 MG tablet   Has the patient contacted their pharmacy? Yes.   (Agent: If no, request that the patient contact the pharmacy for the refill.) (Agent: If yes, when and what did the pharmacy advise?)  Preferred Pharmacy (with phone number or street name): [facility] [place], [place]: Please be advised that RX refills may take up to 3 business days. We ask that you follow-up with your pharmacy.

## 2017-10-30 NOTE — Telephone Encounter (Signed)
YRS YOU CAN SPLIT UP,  I WOULD DO IT BUT NOT SURE IF YOU ARE REFERRING TO TELMISARTAN OR LOSARTAN

## 2017-10-30 NOTE — Telephone Encounter (Signed)
Spoke with [facility] on [address] and they stated that they can fill the rx it just has to be sent in as separate prescriptions. Is it okay to split this up and sent to pharmacy.

## 2017-10-30 NOTE — Telephone Encounter (Signed)
rx change  

## 2017-10-31 MED ORDER — HYDROCHLOROTHIAZIDE 12.5 MG PO TABS: 12.5000 mg | ORAL_TABLET | Freq: Every day | ORAL | 1 refills | Status: DC

## 2017-10-31 MED ORDER — LOSARTAN POTASSIUM 50 MG PO TABS: 50.0000 mg | ORAL_TABLET | Freq: Every day | ORAL | 1 refills | Status: DC

## 2017-10-31 NOTE — Telephone Encounter (Signed)
Spoke with pt to let her know that the medication has been split into two different pills since the combo pill has been on back order for so long. She is aware that there will now be a pill for plain losartan 50mg  once daily and plain hctz 12.5mg  once daily for her to take. Pt gave a verbal understanding.

## 2017-10-31 NOTE — Telephone Encounter (Signed)
LMTCB. Please transfer pt to our office.  

## 2017-11-01 NOTE — Telephone Encounter (Signed)
Pt requested she states last week - pharmacy requested 8/5 - pt is leaving town today and is out of medication.  Send to [facility] on [address].

## 2017-12-13 ENCOUNTER — Ambulatory Visit (INDEPENDENT_AMBULATORY_CARE_PROVIDER_SITE_OTHER): Payer: PPO | Admitting: Internal Medicine

## 2017-12-13 ENCOUNTER — Encounter: Payer: Self-pay | Admitting: Internal Medicine

## 2017-12-13 VITALS — BP 126/68 | HR 59 | Temp 98.0°F | Resp 15 | Ht 65.75 in | Wt 175.8 lb

## 2017-12-13 DIAGNOSIS — E113559 Type 2 diabetes mellitus with stable proliferative diabetic retinopathy, unspecified eye: Secondary | ICD-10-CM

## 2017-12-13 DIAGNOSIS — E538 Deficiency of other specified B group vitamins: Secondary | ICD-10-CM

## 2017-12-13 DIAGNOSIS — Z23 Encounter for immunization: Secondary | ICD-10-CM

## 2017-12-13 DIAGNOSIS — E113599 Type 2 diabetes mellitus with proliferative diabetic retinopathy without macular edema, unspecified eye: Secondary | ICD-10-CM

## 2017-12-13 DIAGNOSIS — Z780 Asymptomatic menopausal state: Secondary | ICD-10-CM | POA: Diagnosis not present

## 2017-12-13 DIAGNOSIS — I10 Essential (primary) hypertension: Secondary | ICD-10-CM

## 2017-12-13 DIAGNOSIS — E559 Vitamin D deficiency, unspecified: Secondary | ICD-10-CM | POA: Diagnosis not present

## 2017-12-13 DIAGNOSIS — E785 Hyperlipidemia, unspecified: Secondary | ICD-10-CM

## 2017-12-13 DIAGNOSIS — E66811 Obesity, class 1: Secondary | ICD-10-CM

## 2017-12-13 DIAGNOSIS — E6609 Other obesity due to excess calories: Secondary | ICD-10-CM

## 2017-12-13 DIAGNOSIS — Z1211 Encounter for screening for malignant neoplasm of colon: Secondary | ICD-10-CM

## 2017-12-13 DIAGNOSIS — Z0000 Encounter for general adult medical examination without abnormal findings: Secondary | ICD-10-CM | POA: Diagnosis not present

## 2017-12-13 DIAGNOSIS — Z6832 Body mass index (BMI) 32.0-32.9, adult: Secondary | ICD-10-CM

## 2017-12-13 LAB — COMPREHENSIVE METABOLIC PANEL
ALBUMIN: 4.5 g/dL (ref 3.5–5.2)
ALK PHOS: 66 U/L (ref 39–117)
ALT: 18 U/L (ref 0–35)
AST: 18 U/L (ref 0–37)
BUN: 18 mg/dL (ref 6–23)
CO2: 28 mEq/L (ref 19–32)
CREATININE: 0.83 mg/dL (ref 0.40–1.20)
Calcium: 10.1 mg/dL (ref 8.4–10.5)
Chloride: 104 mEq/L (ref 96–112)
GFR: 73.3 mL/min (ref >60.00)
Glucose, Bld: 136 mg/dL — ABNORMAL HIGH (ref 70–99)
POTASSIUM: 4.1 meq/L (ref 3.5–5.1)
SODIUM: 141 meq/L (ref 135–145)
TOTAL PROTEIN: 7.6 g/dL (ref 6.0–8.3)
Total Bilirubin: 0.6 mg/dL (ref 0.2–1.2)

## 2017-12-13 LAB — VITAMIN D 25 HYDROXY (VIT D DEFICIENCY, FRACTURES): VITD: 41.2 ng/mL (ref 30.00–100.00)

## 2017-12-13 LAB — LIPID PANEL
CHOL/HDL RATIO: 3
Cholesterol: 144 mg/dL (ref 0–200)
HDL: 49.8 mg/dL (ref >39.00)
LDL Cholesterol: 66 mg/dL (ref 0–99)
NONHDL: 94.24
Triglycerides: 139 mg/dL (ref 0.0–149.0)
VLDL: 27.8 mg/dL (ref 0.0–40.0)

## 2017-12-13 LAB — VITAMIN B12: VITAMIN B 12: 596 pg/mL (ref 211–911)

## 2017-12-13 LAB — MICROALBUMIN / CREATININE URINE RATIO
Creatinine,U: 48.9 mg/dL
Microalb Creat Ratio: 1.4 mg/g (ref 0.0–30.0)
Microalb, Ur: <0.7 mg/dL (ref 0.0–1.9)

## 2017-12-13 LAB — HEMOGLOBIN A1C: HEMOGLOBIN A1C: 6.3 % (ref 4.6–6.5)

## 2017-12-13 NOTE — Patient Instructions (Addendum)
I will initiate the order for your annual colon cancer screening  Test.  It is called  Cologuard.  It will be delivered to your house, and you will send off a stool sample in the envelope it provides.    You can stop your daily aspirin,  Because it is no longer recommended for patients without known heart or vascular disease.    I have ordered a DEXA (bone density) scan  You received your annual influenza vaccine and your "one time only" Prevnar (pneumonia vaccine)   Health Maintenance for Postmenopausal Women Menopause is a normal process in which your reproductive ability comes to an end. This process happens gradually over a span of months to years, usually between the ages of 77 and 40. Menopause is complete when you have missed 12 consecutive menstrual periods. It is important to talk with your health care provider about some of the most common conditions that affect postmenopausal women, such as heart disease, cancer, and bone loss (osteoporosis). Adopting a healthy lifestyle and getting preventive care can help to promote your health and wellness. Those actions can also lower your chances of developing some of these common conditions. What should I know about menopause? During menopause, you may experience a number of symptoms, such as:  Moderate-to-severe hot flashes.  Night sweats.  Decrease in sex drive.  Mood swings.  Headaches.  Tiredness.  Irritability.  Memory problems.  Insomnia.  Choosing to treat or not to treat menopausal changes is an individual decision that you make with your health care provider. What should I know about hormone replacement therapy and supplements? Hormone therapy products are effective for treating symptoms that are associated with menopause, such as hot flashes and night sweats. Hormone replacement carries certain risks, especially as you become older. If you are thinking about using estrogen or estrogen with progestin treatments, discuss  the benefits and risks with your health care provider. What should I know about heart disease and stroke? Heart disease, heart attack, and stroke become more likely as you age. This may be due, in part, to the hormonal changes that your body experiences during menopause. These can affect how your body processes dietary fats, triglycerides, and cholesterol. Heart attack and stroke are both medical emergencies. There are many things that you can do to help prevent heart disease and stroke:  Have your blood pressure checked at least every 1-2 years. High blood pressure causes heart disease and increases the risk of stroke.  If you are 27-58 years old, ask your health care provider if you should take aspirin to prevent a heart attack or a stroke.  Do not use any tobacco products, including cigarettes, chewing tobacco, or electronic cigarettes. If you need help quitting, ask your health care provider.  It is important to eat a healthy diet and maintain a healthy weight. ? Be sure to include plenty of vegetables, fruits, low-fat dairy products, and lean protein. ? Avoid eating foods that are high in solid fats, added sugars, or salt (sodium).  Get regular exercise. This is one of the most important things that you can do for your health. ? Try to exercise for at least 150 minutes each week. The type of exercise that you do should increase your heart rate and make you sweat. This is known as moderate-intensity exercise. ? Try to do strengthening exercises at least twice each week. Do these in addition to the moderate-intensity exercise.  Know your numbers.Ask your health care provider to check your cholesterol  and your blood glucose. Continue to have your blood tested as directed by your health care provider.  What should I know about cancer screening? There are several types of cancer. Take the following steps to reduce your risk and to catch any cancer development as early as possible. Breast  Cancer  Practice breast self-awareness. ? This means understanding how your breasts normally appear and feel. ? It also means doing regular breast self-exams. Let your health care provider know about any changes, no matter how small.  If you are 90 or older, have a clinician do a breast exam (clinical breast exam or CBE) every year. Depending on your age, family history, and medical history, it may be recommended that you also have a yearly breast X-ray (mammogram).  If you have a family history of breast cancer, talk with your health care provider about genetic screening.  If you are at high risk for breast cancer, talk with your health care provider about having an MRI and a mammogram every year.  Breast cancer (BRCA) gene test is recommended for women who have family members with BRCA-related cancers. Results of the assessment will determine the need for genetic counseling and BRCA1 and for BRCA2 testing. BRCA-related cancers include these types: ? Breast. This occurs in males or females. ? Ovarian. ? Tubal. This may also be called fallopian tube cancer. ? Cancer of the abdominal or pelvic lining (peritoneal cancer). ? Prostate. ? Pancreatic.  Cervical, Uterine, and Ovarian Cancer Your health care provider may recommend that you be screened regularly for cancer of the pelvic organs. These include your ovaries, uterus, and vagina. This screening involves a pelvic exam, which includes checking for microscopic changes to the surface of your cervix (Pap test).  For women ages 21-65, health care providers may recommend a pelvic exam and a Pap test every three years. For women ages 32-65, they may recommend the Pap test and pelvic exam, combined with testing for human papilloma virus (HPV), every five years. Some types of HPV increase your risk of cervical cancer. Testing for HPV may also be done on women of any age who have unclear Pap test results.  Other health care providers may not  recommend any screening for nonpregnant women who are considered low risk for pelvic cancer and have no symptoms. Ask your health care provider if a screening pelvic exam is right for you.  If you have had past treatment for cervical cancer or a condition that could lead to cancer, you need Pap tests and screening for cancer for at least 20 years after your treatment. If Pap tests have been discontinued for you, your risk factors (such as having a new sexual partner) need to be reassessed to determine if you should start having screenings again. Some women have medical problems that increase the chance of getting cervical cancer. In these cases, your health care provider may recommend that you have screening and Pap tests more often.  If you have a family history of uterine cancer or ovarian cancer, talk with your health care provider about genetic screening.  If you have vaginal bleeding after reaching menopause, tell your health care provider.  There are currently no reliable tests available to screen for ovarian cancer.  Lung Cancer Lung cancer screening is recommended for adults 19-43 years old who are at high risk for lung cancer because of a history of smoking. A yearly low-dose CT scan of the lungs is recommended if you:  Currently smoke.  Have a  history of at least 30 pack-years of smoking and you currently smoke or have quit within the past 15 years. A pack-year is smoking an average of one pack of cigarettes per day for one year.  Yearly screening should:  Continue until it has been 15 years since you quit.  Stop if you develop a health problem that would prevent you from having lung cancer treatment.  Colorectal Cancer  This type of cancer can be detected and can often be prevented.  Routine colorectal cancer screening usually begins at age 62 and continues through age 88.  If you have risk factors for colon cancer, your health care provider may recommend that you be screened  at an earlier age.  If you have a family history of colorectal cancer, talk with your health care provider about genetic screening.  Your health care provider may also recommend using home test kits to check for hidden blood in your stool.  A small camera at the end of a tube can be used to examine your colon directly (sigmoidoscopy or colonoscopy). This is done to check for the earliest forms of colorectal cancer.  Direct examination of the colon should be repeated every 5-10 years until age 61. However, if early forms of precancerous polyps or small growths are found or if you have a family history or genetic risk for colorectal cancer, you may need to be screened more often.  Skin Cancer  Check your skin from head to toe regularly.  Monitor any moles. Be sure to tell your health care provider: ? About any new moles or changes in moles, especially if there is a change in a mole's shape or color. ? If you have a mole that is larger than the size of a pencil eraser.  If any of your family members has a history of skin cancer, especially at a young age, talk with your health care provider about genetic screening.  Always use sunscreen. Apply sunscreen liberally and repeatedly throughout the day.  Whenever you are outside, protect yourself by wearing long sleeves, pants, a wide-brimmed hat, and sunglasses.  What should I know about osteoporosis? Osteoporosis is a condition in which bone destruction happens more quickly than new bone creation. After menopause, you may be at an increased risk for osteoporosis. To help prevent osteoporosis or the bone fractures that can happen because of osteoporosis, the following is recommended:  If you are 25-23 years old, get at least 1,000 mg of calcium and at least 600 mg of vitamin D per day.  If you are older than age 69 but younger than age 21, get at least 1,200 mg of calcium and at least 600 mg of vitamin D per day.  If you are older than age 53,  get at least 1,200 mg of calcium and at least 800 mg of vitamin D per day.  Smoking and excessive alcohol intake increase the risk of osteoporosis. Eat foods that are rich in calcium and vitamin D, and do weight-bearing exercises several times each week as directed by your health care provider. What should I know about how menopause affects my mental health? Depression may occur at any age, but it is more common as you become older. Common symptoms of depression include:  Low or sad mood.  Changes in sleep patterns.  Changes in appetite or eating patterns.  Feeling an overall lack of motivation or enjoyment of activities that you previously enjoyed.  Frequent crying spells.  Talk with your health care  provider if you think that you are experiencing depression. What should I know about immunizations? It is important that you get and maintain your immunizations. These include:  Tetanus, diphtheria, and pertussis (Tdap) booster vaccine.  Influenza every year before the flu season begins.  Pneumonia vaccine.  Shingles vaccine.  Your health care provider may also recommend other immunizations. This information is not intended to replace advice given to you by your health care provider. Make sure you discuss any questions you have with your health care provider. Document Released: 05/05/2005 Document Revised: 10/01/2015 Document Reviewed: 12/15/2014 [employer] Interactive Patient Education  2018 [employer]  

## 2017-12-13 NOTE — Progress Notes (Signed)
Patient ID: Ashlee Cox, female    DOB: 1950/07/31  Age: 67 y.o. MRN: [id number]  The patient is here for her welcome to  Medicare physical  examination and management of other chronic and acute problems.   The risk factors are reflected in the social history.  The roster of all physicians providing medical care to patient - is listed in the Snapshot section of the chart.  Activities of daily living:  The patient is 100% independent in all ADLs: dressing, toileting, feeding as well as independent mobility  Home safety : The patient has smoke detectors in the home. They wear seatbelts.  There are no firearms at home. There is no violence in the home.   There is no risks for hepatitis, STDs or HIV. There is no   history of blood transfusion. They have no travel history to infectious disease endemic areas of the world.  The patient has seen their dentist in the last six month. They have seen their eye doctor in the last year. They admit to slight hearing difficulty with regard to whispered voices and some television programs.  They have deferred audiologic testing in the last year.  They do not  have excessive sun exposure. Discussed the need for sun protection: hats, long sleeves and use of sunscreen if there is significant sun exposure.   Diet: the importance of a healthy diet is discussed. They do have a healthy diet.  Following weight watchers  But can't lose the last 18 lbs.    The benefits of regular aerobic exercise were discussed. She walks 4 times per week ,  20 minutes.   Depression screen: there are no signs or vegative symptoms of depression- irritability, change in appetite, anhedonia, sadness/tearfullness.  Cognitive assessment: the patient manages all their financial and personal affairs and is actively engaged. They could relate day,date,year and events; recalled 2/3 objects at 3 minutes; performed clock-face test normally.  The following portions of the patient's history were  reviewed and updated as appropriate: allergies, current medications, past family history, past medical history,  past surgical history, past social history  and problem list.  Visual acuity was not assessed per patient preference since she has regular follow up with her ophthalmologist. Hearing and body mass index were assessed and reviewed.   During the course of the visit the patient was educated and counseled about appropriate screening and preventive services including : fall prevention , diabetes screening, nutrition counseling, colorectal cancer screening, and recommended immunizations.    .During the course of the visit , End of Life objectives were discussed at length,  Patient does not have a living will in place or a healthcare power of attorney.  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 Diagnoses of Hyperlipidemia with target LDL less than 100, Vitamin D deficiency, B12 deficiency, Postmenopausal estrogen deficiency, Colon cancer screening, Need for influenza vaccination, Need for vaccination with 13-polyvalent pneumococcal conjugate vaccine, Class 1 obesity due to excess calories with serious comorbidity and body mass index (BMI) of 32.0 to 32.9 in adult, Encounter for preventive health examination, and Essential hypertension were also pertinent to this visit.  Tfollow up on type 2 DM: diet controlled , hypertennion and hyperlipidemia .  Patient does not check blood sugars more than once a month,  Last one was 120 a month ago in a fasting state.  Dos not recall any above 160 or  less than 80.  No complaints today.  Taking his medications as directed,   exercising on a regular basis and  trying to lose weight.  Patient voices awareness  of the  foods he/she needs to avoid,  And follows the Weight Watcher's Plan  about 75% of the time.  Has  had an annual diabetic eye exam.  Denies numbness and tingling in lower extremities.  Denies hypoglycemic symptoms.   Lab Results  Component Value Date   HGBA1C 6.3 12/13/2017    Feeling well except for occasional  Left knee pain aggravated by climbing the stairs   History Ashlee Cox has a past medical history of Cyst, breast, H/O: rheumatic fever, History of colonoscopy (2010), Hyperlipidemia, Hypertension, and Menopause.   She has a past surgical history that includes Appendectomy (2006); Cholecystectomy (2006); Vaginal delivery; and Breast cyst aspiration (Left, 1999).   Her family history includes Cancer (age of onset: 11) in her mother; Diabetes in her father, mother, and sister; Heart disease (age of onset: 44) in her mother; Stroke (age of onset: 42) in her father.She reports that she quit smoking about 11 years ago. Her smoking use included cigarettes. She quit after 1.00 year of use. She has never used smokeless tobacco. She reports that she does not drink alcohol or use drugs.  Outpatient Medications Prior to Visit  Medication Sig Dispense Refill  . docusate sodium (COLACE) 100 MG capsule Take 100 mg by mouth daily.    . hydrochlorothiazide (HYDRODIURIL) 12.5 MG tablet Take 1 tablet (12.5 mg total) by mouth daily. 90 tablet 1  . losartan (COZAAR) 50 MG tablet Take 1 tablet (50 mg total) by mouth daily. 90 tablet 1  . lovastatin (MEVACOR) 40 MG tablet Take 1 tablet (40 mg total) by mouth at bedtime. 90 tablet 3  . Multiple Vitamin (MULTIVITAMIN) tablet Take 1 tablet by mouth daily.      Marland Kitchen omeprazole (PRILOSEC) 20 MG capsule TAKE 1 CAPSULE BY MOUTH ONCE DAILY 90 capsule 2  . aspirin 81 MG tablet Take 81 mg by mouth daily.      . Cholecalciferol (VITAMIN D-3) 1000 UNITS CAPS Take 1,000 Units by mouth daily.    . Cinnamon 500 MG TABS Take by mouth.     No facility-administered medications  prior to visit.     Review of Systems   Patient denies headache, fevers, malaise, unintentional weight loss, skin rash, eye pain, sinus congestion and sinus pain, sore throat, dysphagia,  hemoptysis , cough, dyspnea, wheezing, chest pain, palpitations, orthopnea, edema, abdominal pain, nausea, melena, diarrhea, constipation, flank pain, dysuria, hematuria, urinary  Frequency, nocturia, numbness, tingling, seizures,  Focal weakness, Loss of consciousness,  Tremor, insomnia, depression, anxiety, and suicidal ideation.      Objective:  BP 126/68 (BP Location: Left Arm, Patient Position: Sitting, Cuff Size: Normal)   Pulse (!) 59   Temp 98 F (36.7 C) (Oral)   Resp 15   Ht 5' 5.75" (1.67 m)   Wt 175 lb 12.8 oz (79.7 kg)   SpO2 98%   BMI 28.59 kg/m   Physical Exam  General appearance: alert, cooperative and appears stated age Head: Normocephalic, without obvious abnormality, atraumatic Eyes: conjunctivae/corneas clear. PERRL, EOM's intact. Fundi benign. Ears: normal TM's and external ear canals both ears Nose: Nares normal. Septum midline. Mucosa normal. No drainage or sinus tenderness. Throat: lips, mucosa, and tongue normal;  teeth and gums normal Neck: no adenopathy, no carotid bruit, no JVD, supple, symmetrical, trachea midline and thyroid not enlarged, symmetric, no tenderness/mass/nodules Lungs: clear to auscultation bilaterally Breasts: normal appearance, no masses or tenderness Heart: regular rate and rhythm, S1, S2 normal, no murmur, click, rub or gallop Abdomen: soft, non-tender; bowel sounds normal; no masses,  no organomegaly Extremities: extremities normal, atraumatic, no cyanosis or edema Pulses: 2+ and symmetric Skin: Skin color, texture, turgor normal. No rashes or lesions Neurologic: Alert and oriented X 3, normal strength and tone. Normal symmetric reflexes. Normal coordination and gait.    Assessment & Plan:   Problem List Items Addressed This Visit    DM type 2  (diabetes mellitus, type 2) ([place]) - Primary    Remains well-controlled on diet alone .  hemoglobin A1c has been consistently at or  less than 7.0 . Patient is up-to-date on eye exams and foot exam is normal today. Patient has no microalbuminuria. Patient is tolerating statin therapy for CAD risk reduction and on ARB for renal protection and hypertension   Lab Results  Component Value Date   MICROALBUR <0.7 12/13/2017   Lab Results  Component Value Date   HGBA1C 6.3 12/13/2017          Relevant Orders   Comprehensive metabolic panel (Completed)   Hemoglobin A1c (Completed)   Microalbumin / creatinine urine ratio (Completed)   Encounter for preventive health examination    .Welcome to  Medicare wellness  exam was done as well as a comprehensive physical exam and management of acute and chronic conditions .  During the course of the visit the patient was educated and counseled about appropriate screening and preventive services including : fall prevention , diabetes screening, nutrition counseling, colorectal cancer screening, and recommended immunizations.  Printed recommendations for health maintenance screenings was given.       Hyperlipidemia with target LDL less than 100    Managed with lovastatin .LDL is at goal  And LFTs are normal Lab Results  Component Value Date   CHOL 144 12/13/2017   HDL 49.80 12/13/2017   LDLCALC 66 12/13/2017   LDLDIRECT 73.0 08/02/2016   TRIG 139.0 12/13/2017   CHOLHDL 3 12/13/2017    Lab Results  Component Value Date   ALT 18 12/13/2017   AST 18 12/13/2017   ALKPHOS 66 12/13/2017   BILITOT 0.6 12/13/2017         Relevant Orders   Lipid panel (Completed)   Hypertension    Well controlled on current regimen. Renal function stable, no changes today.      Obesity    I have  encouraged  Continued weight loss with goal of 10% of body weigh over the next 6 months using a low glycemic index diet and regular exercise a minimum of 5 days per  week.         Other Visit Diagnoses    Vitamin D deficiency       Relevant Orders   VITAMIN D 25 Hydroxy (Vit-D Deficiency, Fractures) (Completed)   B12 deficiency       Relevant Orders   Vitamin B12 (Completed)   Postmenopausal estrogen deficiency       Relevant Orders   DG Bone Density   Colon cancer screening       Relevant Orders   Cologuard   Need for influenza vaccination       Relevant Orders   Flu vaccine HIGH DOSE PF (Fluzone High dose) (Completed)   Need  for vaccination with 13-polyvalent pneumococcal conjugate vaccine       Relevant Orders   Pneumococcal conjugate vaccine 13-valent IM (Completed)      I have discontinued Aikam A. Rice's aspirin, Vitamin D-3, and Cinnamon. I am also having her maintain her multivitamin, docusate sodium, lovastatin, omeprazole, losartan, and hydrochlorothiazide.  No orders of the defined types were placed in this encounter.   Medications Discontinued During This Encounter  Medication Reason  . Cholecalciferol (VITAMIN D-3) 1000 UNITS CAPS Patient Preference  . Cinnamon 500 MG TABS Patient Preference  . aspirin 81 MG tablet     Follow-up: Return for follow up diabetes, hypertension .   [name], MD

## 2017-12-15 ENCOUNTER — Encounter: Payer: Self-pay | Admitting: Internal Medicine

## 2017-12-15 NOTE — Assessment & Plan Note (Signed)
Well controlled on current regimen. Renal function stable, no changes today. 

## 2017-12-15 NOTE — Assessment & Plan Note (Addendum)
Managed with lovastatin .LDL is at goal  And LFTs are normal Lab Results  Component Value Date   CHOL 144 12/13/2017   HDL 49.80 12/13/2017   LDLCALC 66 12/13/2017   LDLDIRECT 73.0 08/02/2016   TRIG 139.0 12/13/2017   CHOLHDL 3 12/13/2017    Lab Results  Component Value Date   ALT 18 12/13/2017   AST 18 12/13/2017   ALKPHOS 66 12/13/2017   BILITOT 0.6 12/13/2017

## 2017-12-15 NOTE — Assessment & Plan Note (Signed)
Remains well-controlled on diet alone .  hemoglobin A1c has been consistently at or  less than 7.0 . Patient is up-to-date on eye exams and foot exam is normal today. Patient has no microalbuminuria. Patient is tolerating statin therapy for CAD risk reduction and on ARB for renal protection and hypertension   Lab Results  Component Value Date   MICROALBUR <0.7 12/13/2017   Lab Results  Component Value Date   HGBA1C 6.3 12/13/2017

## 2017-12-15 NOTE — Assessment & Plan Note (Signed)
Welcome to Medicare wellness  exam was done as well as a comprehensive physical exam and management of acute and chronic conditions .  During the course of the visit the patient was educated and counseled about appropriate screening and preventive services including : fall prevention , diabetes screening, nutrition counseling, colorectal cancer screening, and recommended immunizations.  Printed recommendations for health maintenance screenings was given.  

## 2017-12-15 NOTE — Assessment & Plan Note (Signed)
I have encouraged  Continued weight loss with goal of 10% of body weigh over the next 6 months using a low glycemic index diet and regular exercise a minimum of 5 days per week.  ? ?

## 2017-12-18 NOTE — Progress Notes (Signed)
Order faxed on 12/13/2017.

## 2017-12-19 DIAGNOSIS — Z1211 Encounter for screening for malignant neoplasm of colon: Secondary | ICD-10-CM | POA: Diagnosis not present

## 2017-12-19 LAB — COLOGUARD: Cologuard: NEGATIVE

## 2017-12-30 ENCOUNTER — Telehealth: Payer: Self-pay | Admitting: Internal Medicine

## 2017-12-30 NOTE — Telephone Encounter (Signed)
The results of your cologuard test were negative.   We will repeat this every 3 years  Until you are 87 for colon CA screening.  

## 2018-01-29 ENCOUNTER — Encounter: Payer: Self-pay | Admitting: Internal Medicine

## 2018-03-13 ENCOUNTER — Other Ambulatory Visit: Payer: Self-pay | Admitting: Internal Medicine

## 2018-03-18 ENCOUNTER — Ambulatory Visit
Admission: RE | Admit: 2018-03-18 | Discharge: 2018-03-18 | Disposition: A | Discharge status: Home / Self Care | Payer: PPO | Source: Ambulatory Visit | Attending: Internal Medicine | Admitting: Internal Medicine

## 2018-03-18 DIAGNOSIS — Z780 Asymptomatic menopausal state: Secondary | ICD-10-CM | POA: Diagnosis not present

## 2018-03-18 DIAGNOSIS — M8588 Other specified disorders of bone density and structure, other site: Secondary | ICD-10-CM | POA: Diagnosis not present

## 2018-03-23 ENCOUNTER — Encounter: Payer: Self-pay | Admitting: Internal Medicine

## 2018-03-23 DIAGNOSIS — Z780 Asymptomatic menopausal state: Secondary | ICD-10-CM | POA: Insufficient documentation

## 2018-03-23 DIAGNOSIS — M810 Age-related osteoporosis without current pathological fracture: Secondary | ICD-10-CM

## 2018-03-23 DIAGNOSIS — M8580 Other specified disorders of bone density and structure, unspecified site: Secondary | ICD-10-CM | POA: Insufficient documentation

## 2018-04-24 ENCOUNTER — Other Ambulatory Visit: Payer: Self-pay | Admitting: Internal Medicine

## 2018-06-13 ENCOUNTER — Ambulatory Visit (INDEPENDENT_AMBULATORY_CARE_PROVIDER_SITE_OTHER): Payer: PPO | Admitting: Internal Medicine

## 2018-06-13 ENCOUNTER — Other Ambulatory Visit: Payer: Self-pay

## 2018-06-13 ENCOUNTER — Encounter: Payer: Self-pay | Admitting: Internal Medicine

## 2018-06-13 VITALS — BP 132/82 | HR 74 | Temp 98.0°F | Resp 15 | Ht 65.75 in | Wt 187.6 lb

## 2018-06-13 DIAGNOSIS — E785 Hyperlipidemia, unspecified: Secondary | ICD-10-CM | POA: Diagnosis not present

## 2018-06-13 DIAGNOSIS — E119 Type 2 diabetes mellitus without complications: Secondary | ICD-10-CM

## 2018-06-13 DIAGNOSIS — M810 Age-related osteoporosis without current pathological fracture: Secondary | ICD-10-CM

## 2018-06-13 DIAGNOSIS — I10 Essential (primary) hypertension: Secondary | ICD-10-CM

## 2018-06-13 DIAGNOSIS — Z780 Asymptomatic menopausal state: Secondary | ICD-10-CM

## 2018-06-13 DIAGNOSIS — E1169 Type 2 diabetes mellitus with other specified complication: Secondary | ICD-10-CM

## 2018-06-13 DIAGNOSIS — E1159 Type 2 diabetes mellitus with other circulatory complications: Secondary | ICD-10-CM

## 2018-06-13 DIAGNOSIS — M8580 Other specified disorders of bone density and structure, unspecified site: Secondary | ICD-10-CM

## 2018-06-13 DIAGNOSIS — E669 Obesity, unspecified: Secondary | ICD-10-CM

## 2018-06-13 DIAGNOSIS — E66811 Obesity, class 1: Secondary | ICD-10-CM

## 2018-06-13 LAB — COMPREHENSIVE METABOLIC PANEL
ALT: 20 U/L (ref 0–35)
AST: 16 U/L (ref 0–37)
Albumin: 4.6 g/dL (ref 3.5–5.2)
Alkaline Phosphatase: 75 U/L (ref 39–117)
BUN: 15 mg/dL (ref 6–23)
CO2: 28 mEq/L (ref 19–32)
CREATININE: 0.87 mg/dL (ref 0.40–1.20)
Calcium: 10.1 mg/dL (ref 8.4–10.5)
Chloride: 101 mEq/L (ref 96–112)
GFR: 65.22 mL/min (ref >60.00)
Glucose, Bld: 147 mg/dL — ABNORMAL HIGH (ref 70–99)
Potassium: 4.1 mEq/L (ref 3.5–5.1)
Sodium: 138 mEq/L (ref 135–145)
Total Bilirubin: 0.6 mg/dL (ref 0.2–1.2)
Total Protein: 7.8 g/dL (ref 6.0–8.3)

## 2018-06-13 LAB — LIPID PANEL
Cholesterol: 174 mg/dL (ref 0–200)
HDL: 46.8 mg/dL (ref >39.00)
NONHDL: 127.02
Total CHOL/HDL Ratio: 4
Triglycerides: 244 mg/dL — ABNORMAL HIGH (ref 0.0–149.0)
VLDL: 48.8 mg/dL — ABNORMAL HIGH (ref 0.0–40.0)

## 2018-06-13 LAB — HEMOGLOBIN A1C: HEMOGLOBIN A1C: 6.4 % (ref 4.6–6.5)

## 2018-06-13 LAB — LDL CHOLESTEROL, DIRECT: Direct LDL: 82 mg/dL

## 2018-06-13 MED ORDER — ZOSTER VAC RECOMB ADJUVANTED 50 MCG/0.5ML IM SUSR: 0.5000 mL | Freq: Once | INTRAMUSCULAR | 1 refills | Status: AC

## 2018-06-13 NOTE — Patient Instructions (Addendum)
Your weight goal is 170 lbs (May 2019) .  Try the SOLA low carb bread ([employer] Frozen bread )  3 g/slice  Tastes great!   Diabetes Mellitus and Exercise Exercising regularly is important for your overall health, especially when you have diabetes (diabetes mellitus). Exercising is not only about losing weight. It has many other health benefits, such as increasing muscle strength and bone density and reducing body fat and stress. This leads to improved fitness, flexibility, and endurance, all of which result in better overall health. Exercise has additional benefits for people with diabetes, including:  Reducing appetite.  Helping to lower and control blood glucose.  Lowering blood pressure.  Helping to control amounts of fatty substances (lipids) in the blood, such as cholesterol and triglycerides.  Helping the body to respond better to insulin (improving insulin sensitivity).  Reducing how much insulin the body needs.  Decreasing the risk for heart disease by: ? Lowering cholesterol and triglyceride levels. ? Increasing the levels of good cholesterol. ? Lowering blood glucose levels. What is my activity plan? Your health care provider or certified diabetes educator can help you make a plan for the type and frequency of exercise (activity plan) that works for you. Make sure that you:  Do at least 150 minutes of moderate-intensity or vigorous-intensity exercise each week. This could be brisk walking, biking, or water aerobics. ? Do stretching and strength exercises, such as yoga or weightlifting, at least 2 times a week. ? Spread out your activity over at least 3 days of the week.  Get some form of physical activity every day. ? Do not go more than 2 days in a row without some kind of physical activity. ? Avoid being inactive for more than 30 minutes at a time. Take frequent breaks to walk or stretch.  Choose a type of exercise or activity that you enjoy, and set realistic  goals.  Start slowly, and gradually increase the intensity of your exercise over time. What do I need to know about managing my diabetes?   Check your blood glucose before and after exercising. ? If your blood glucose is 240 mg/dL (13.3 mmol/L) or higher before you exercise, check your urine for ketones. If you have ketones in your urine, do not exercise until your blood glucose returns to normal. ? If your blood glucose is 100 mg/dL (5.6 mmol/L) or lower, eat a snack containing 15-20 grams of carbohydrate. Check your blood glucose 15 minutes after the snack to make sure that your level is above 100 mg/dL (5.6 mmol/L) before you start your exercise.  Know the symptoms of low blood glucose (hypoglycemia) and how to treat it. Your risk for hypoglycemia increases during and after exercise. Common symptoms of hypoglycemia can include: ? Hunger. ? Anxiety. ? Sweating and feeling clammy. ? Confusion. ? Dizziness or feeling light-headed. ? Increased heart rate or palpitations. ? Blurry vision. ? Tingling or numbness around the mouth, lips, or tongue. ? Tremors or shakes. ? Irritability.  Keep a rapid-acting carbohydrate snack available before, during, and after exercise to help prevent or treat hypoglycemia.  Avoid injecting insulin into areas of the body that are going to be exercised. For example, avoid injecting insulin into: ? The arms, when playing tennis. ? The legs, when jogging.  Keep records of your exercise habits. Doing this can help you and your health care provider adjust your diabetes management plan as needed. Write down: ? Food that you eat before and after you exercise. ?  Blood glucose levels before and after you exercise. ? The type and amount of exercise you have done. ? When your insulin is expected to peak, if you use insulin. Avoid exercising at times when your insulin is peaking.  When you start a new exercise or activity, work with your health care provider to make  sure the activity is safe for you, and to adjust your insulin, medicines, or food intake as needed.  Drink plenty of water while you exercise to prevent dehydration or heat stroke. Drink enough fluid to keep your urine clear or pale yellow. Summary  Exercising regularly is important for your overall health, especially when you have diabetes (diabetes mellitus).  Exercising has many health benefits, such as increasing muscle strength and bone density and reducing body fat and stress.  Your health care provider or certified diabetes educator can help you make a plan for the type and frequency of exercise (activity plan) that works for you.  When you start a new exercise or activity, work with your health care provider to make sure the activity is safe for you, and to adjust your insulin, medicines, or food intake as needed. This information is not intended to replace advice given to you by your health care provider. Make sure you discuss any questions you have with your health care provider. Document Released: 06/03/2003 Document Revised: 09/21/2016 Document Reviewed: 08/23/2015 Elsevier Interactive Patient Education  2019 [employer].

## 2018-06-13 NOTE — Progress Notes (Signed)
Subjective:  Patient ID: Ashlee Cox, female    DOB: 11/30/1950  Age: 68 y.o. MRN: [id number]  CC: The primary encounter diagnosis was Hyperlipidemia with target LDL less than 100. Diagnoses of Diabetes mellitus without complication ([place]), Obesity, diabetes, and hypertension syndrome ([place]), Osteopenia after menopause, Essential hypertension, and Obesity (BMI 30.0-34.9) were also pertinent to this visit.  HPI [name] presents for 6 month follow up on obesity with type 2 DM,  Diet controlled and hypertension.   She feels generally well.  But she notes that she  has gained weight since she stopped going to [employer] due to increased monthly cost. She has modified her diet as of 2 weeks ago and has lost 4 lbs since then.  She is walking for exercise,  4 to 5 days per week.   Body mass index is 30.51 kg/m.         Outpatient Medications Prior to Visit  Medication Sig Dispense Refill   docusate sodium (COLACE) 100 MG capsule Take 100 mg by mouth daily.     hydrochlorothiazide (HYDRODIURIL) 12.5 MG tablet TAKE 1 TABLET BY MOUTH ONCE DAILY 90 tablet 0   losartan (COZAAR) 50 MG tablet TAKE 1 TABLET BY MOUTH ONCE DAILY 90 tablet 0   lovastatin (MEVACOR) 40 MG tablet TAKE 1 TABLET BY MOUTH AT BEDTIME 90 tablet 1   Multiple Vitamin (MULTIVITAMIN) tablet Take 1 tablet by mouth daily.       omeprazole (PRILOSEC) 20 MG capsule TAKE 1 CAPSULE BY MOUTH ONCE DAILY 90 capsule 2   No facility-administered medications prior to visit.     Review of Systems;  Patient denies headache, fevers, malaise, unintentional weight loss, skin rash, eye pain, sinus congestion and sinus pain, sore throat, dysphagia,  hemoptysis , cough, dyspnea, wheezing, chest pain, palpitations, orthopnea, edema, abdominal pain, nausea, melena, diarrhea, constipation, flank pain, dysuria, hematuria, urinary  Frequency, nocturia, numbness, tingling, seizures,  Focal weakness, Loss of consciousness,  Tremor,  insomnia, depression, anxiety, and suicidal ideation.      Objective:  BP 132/82 (BP Location: Left Arm, Patient Position: Sitting, Cuff Size: Large)    Pulse 74    Temp 98 F (36.7 C) (Oral)    Resp 15    Ht 5' 5.75" (1.67 m)    Wt 187 lb 9.6 oz (85.1 kg)    SpO2 98%    BMI 30.51 kg/m   BP Readings from Last 3 Encounters:  06/13/18 132/82  12/13/17 126/68  08/02/17 112/74    Wt Readings from Last 3 Encounters:  06/13/18 187 lb 9.6 oz (85.1 kg)  12/13/17 175 lb 12.8 oz (79.7 kg)  08/02/17 170 lb 12.8 oz (77.5 kg)    General appearance: alert, cooperative and appears stated age Ears: normal TM's and external ear canals both ears Throat: lips, mucosa, and tongue normal; teeth and gums normal Neck: no adenopathy, no carotid bruit, supple, symmetrical, trachea midline and thyroid not enlarged, symmetric, no tenderness/mass/nodules Back: symmetric, no curvature. ROM normal. No CVA tenderness. Lungs: clear to auscultation bilaterally Heart: regular rate and rhythm, S1, S2 normal, no murmur, click, rub or gallop Abdomen: soft, non-tender; bowel sounds normal; no masses,  no organomegaly Pulses: 2+ and symmetric Skin: Skin color, texture, turgor normal. No rashes or lesions Lymph nodes: Cervical, supraclavicular, and axillary nodes normal.  Dg Bone Density  Result Date: 03/18/2018 EXAM: DUAL X-RAY ABSORPTIOMETRY (DXA) FOR BONE MINERAL DENSITY IMPRESSION: Technologist: MTB Your patient Ashlee Cox completed a BMD test  on 03/18/2018 using the [employer] (analysis version: 14.10) manufactured by [employer]. The following summarizes the results of our evaluation. PATIENT BIOGRAPHICAL: Name: Ashlee, Cox Patient ID: [id number] Birth Date: 04/06/1950 Height: 65.7 in. Gender: Female Exam Date: 03/18/2018 Weight: 185.0 lbs. Indications: Caucasian, Diabetic, Postmenopausal Fractures: Treatments: Multi-Vitamin ASSESSMENT: The BMD measured at AP Spine L1-L3 is 1.042 g/cm2 with a  T-score of -1.1. This patient is considered osteopenic according to [facility]) criteria. the quality of the scan is good. L-4 was excluded due to degenerative changes. Site Region Measured Measured WHO Young Adult BMD Date       Age      Classification T-score AP Spine L1-L3 03/18/2018 65.3 Osteopenia -1.1 1.042 g/cm2 DualFemur Neck Left 03/18/2018 65.3 Normal -0.7 0.943 g/cm2 DualFemur Total Mean 03/18/2018 65.3 Normal -0.4 0.962 g/cm2 World Health Organization [facility]) criteria for post-menopausal, Caucasian Women: Normal:       T-score at or above -1 SD Osteopenia:   T-score between -1 and -2.5 SD Osteoporosis: T-score at or below -2.5 SD RECOMMENDATIONS: 1. All patients should optimize calcium and vitamin D intake. 2. Consider FDA-approved medical therapies in postmenopausal women and men aged 69 years and older, based on the following: a. A hip or vertebral(clinical or morphometric) fracture b. T-score < -2.5 at the femoral neck or spine after appropriate evaluation to exclude secondary causes c. Low bone mass (T-score between -1.0 and -2.5 at the femoral neck or spine) and a 10-year probability of a hip fracture > 3% or a 10-year probability of a major osteoporosis-related fracture > 20% based on the [place]-adapted WHO algorithm d. Clinician judgment and/or patient preferences may indicate treatment for people with 10-year fracture probabilities above or below these levels FOLLOW-UP: Patients with diagnosis of osteoporosis or at high risk for fracture should have regular bone mineral density tests. For patients eligible for Medicare, routine testing is allowed once every 2 years. The testing frequency can be increased to one year for patients who have rapidly progressing disease, those who are receiving or discontinuing medical therapy to restore bone mass, or have additional risk factors. I have reviewed this report, and agree with the above findings. [facility] Radiology Technologist: MTB Your  patient Ashlee Cox completed a FRAX assessment on 03/18/2018 using the [place] (analysis version: 14.10) manufactured by [employer]. The following summarizes the results of our evaluation. PATIENT BIOGRAPHICAL: Name: Leolia, Vinzant Patient ID: [id number] Birth Date: 29-Jan-1951 Height:    65.7 in. Gender:     Female    [age]       Weight:    185.0 lbs. Ethnicity:  White                            Exam Date: 03/18/2018 FRAX* RESULTS:  (version: 3.5) 10-year Probability of Fracture1 Major Osteoporotic Fracture2 Hip Fracture 7.3% 0.4% Population: [place] (Caucasian) Risk Factors: None Based on Femur (Left) Neck BMD 1 -The 10-year probability of fracture may be lower than reported if the patient has received treatment. 2 -Major Osteoporotic Fracture: Clinical Spine, Forearm, Hip or Shoulder *FRAX is a Materials engineer of the [employer] for Metabolic Bone Disease, a [place] (WHO) [facility]. ASSESSMENT: The probability of a major osteoporotic fracture is 7.3% within the next ten years. The probability of a hip fracture is 0.4% within the next ten  years. . Electronically Signed   By: Lowella Grip III M.D.   On: 03/18/2018 09:23    Assessment & Plan:   Problem List Items Addressed This Visit    Osteopenia after menopause    DEXA reviewed. she has osteopenia,  Moderate.  Would repeat in 2 years and consider therapy then if there is a significant change. Continue calcium, vitamin d and weight bearing exercise on a regular basis.       Obesity, diabetes, and hypertension syndrome ([place])    Remains well-controlled on diet alone .  hemoglobin A1c has been consistently at or  less than 7.0 . Patient is up-to-date on eye exams and foot exam is normal today. Patient has no microalbuminuria. Patient is tolerating statin therapy for CAD risk reduction and on ARB for renal protection and hypertension   Lab Results  Component Value  Date   MICROALBUR <0.7 12/13/2017   Lab Results  Component Value Date   HGBA1C 6.4 06/13/2018       Lab Results  Component Value Date   HGBA1C 6.4 06/13/2018         Relevant Orders   Hemoglobin A1c (Completed)   Obesity    I have  encouraged  Continued weight loss with goal of 10% of body weigh over the next 6 months using a low glycemic index diet and regular exercise a minimum of 5 days per week.        Hypertension    Well controlled on current regimen of losartan and hctz .  Advised to take losartan in the evening . Renal function stable, no changes today.  Lab Results  Component Value Date   CREATININE 0.87 06/13/2018   Lab Results  Component Value Date   NA 138 06/13/2018   K 4.1 06/13/2018   CL 101 06/13/2018   CO2 28 06/13/2018         Hyperlipidemia with target LDL less than 100 - Primary    Tolerating lovastatin.  LDL is at goal. Recommending exercise for reduction in triglycerides  Lab Results  Component Value Date   CHOL 174 06/13/2018   HDL 46.80 06/13/2018   LDLCALC 66 12/13/2017   LDLDIRECT 82.0 06/13/2018   TRIG 244.0 (H) 06/13/2018   CHOLHDL 4 06/13/2018         Relevant Orders   Comprehensive metabolic panel (Completed)   LDL cholesterol, direct (Completed)    Other Visit Diagnoses    Diabetes mellitus without complication ([place])       Relevant Orders   Lipid panel (Completed)      I am having Carlynn A. Rice start on Zoster Vaccine Adjuvanted. I am also having her maintain her multivitamin, docusate sodium, omeprazole, lovastatin, hydrochlorothiazide, and losartan.  Meds ordered this encounter  Medications   Zoster Vaccine Adjuvanted [facility]) injection    Sig: Inject 0.5 mLs into the muscle once for 1 dose.    Dispense:  1 each    Refill:  1    There are no discontinued medications.  Follow-up: No follow-ups on file.   [name], MD

## 2018-06-15 NOTE — Assessment & Plan Note (Signed)
I have encouraged  Continued weight loss with goal of 10% of body weigh over the next 6 months using a low glycemic index diet and regular exercise a minimum of 5 days per week.  ? ?

## 2018-06-15 NOTE — Assessment & Plan Note (Signed)
Tolerating lovastatin.  LDL is at goal. Recommending exercise for reduction in triglycerides  Lab Results  Component Value Date   CHOL 174 06/13/2018   HDL 46.80 06/13/2018   LDLCALC 66 12/13/2017   LDLDIRECT 82.0 06/13/2018   TRIG 244.0 (H) 06/13/2018   CHOLHDL 4 06/13/2018

## 2018-06-15 NOTE — Assessment & Plan Note (Addendum)
Well controlled on current regimen of losartan and hctz .  Advised to take losartan in the evening . Renal function stable, no changes today.  Lab Results  Component Value Date   CREATININE 0.87 06/13/2018   Lab Results  Component Value Date   NA 138 06/13/2018   K 4.1 06/13/2018   CL 101 06/13/2018   CO2 28 06/13/2018

## 2018-06-15 NOTE — Assessment & Plan Note (Signed)
Remains well-controlled on diet alone .  hemoglobin A1c has been consistently at or  less than 7.0 . Patient is up-to-date on eye exams and foot exam is normal today. Patient has no microalbuminuria. Patient is tolerating statin therapy for CAD risk reduction and on ARB for renal protection and hypertension   Lab Results  Component Value Date   MICROALBUR <0.7 12/13/2017   Lab Results  Component Value Date   HGBA1C 6.4 06/13/2018       Lab Results  Component Value Date   HGBA1C 6.4 06/13/2018

## 2018-06-15 NOTE — Assessment & Plan Note (Signed)
DEXA reviewed. she has osteopenia,  Moderate.  Would repeat in 2 years and consider therapy then if there is a significant change. Continue calcium, vitamin d and weight bearing exercise on a regular basis.

## 2018-07-29 ENCOUNTER — Other Ambulatory Visit: Payer: Self-pay | Admitting: Internal Medicine

## 2018-09-12 ENCOUNTER — Other Ambulatory Visit: Payer: Self-pay | Admitting: Internal Medicine

## 2018-09-26 ENCOUNTER — Other Ambulatory Visit: Payer: Self-pay

## 2018-09-26 ENCOUNTER — Ambulatory Visit (INDEPENDENT_AMBULATORY_CARE_PROVIDER_SITE_OTHER): Payer: PPO | Admitting: Family Medicine

## 2018-09-26 ENCOUNTER — Telehealth: Payer: Self-pay | Admitting: Family Medicine

## 2018-09-26 DIAGNOSIS — Z20828 Contact with and (suspected) exposure to other viral communicable diseases: Secondary | ICD-10-CM | POA: Diagnosis not present

## 2018-09-26 DIAGNOSIS — Z20822 Contact with and (suspected) exposure to covid-19: Secondary | ICD-10-CM

## 2018-09-26 DIAGNOSIS — R6889 Other general symptoms and signs: Secondary | ICD-10-CM

## 2018-09-26 DIAGNOSIS — J029 Acute pharyngitis, unspecified: Secondary | ICD-10-CM | POA: Diagnosis not present

## 2018-09-26 NOTE — Telephone Encounter (Signed)
Ashlee Cox  1950-10-21  [id number]  Needs for test: +covid exposure, scratchy/hoarse throat  [employer], [facility]

## 2018-09-26 NOTE — Progress Notes (Signed)
Patient ID: Ashlee Cox, female   DOB: 1950-03-29, 68 y.o.   MRN: [id number]    Virtual Visit via phone Note  This visit type was conducted due to national recommendations for restrictions regarding the COVID-19 pandemic (e.g. social distancing).  This format is felt to be most appropriate for this patient at this time.  All issues noted in this document were discussed and addressed.  No physical exam was performed (except for noted visual exam findings with Video Visits).   I connected with Jiles Harold today at  4:00 PM EDT by a video enabled telemedicine application or telephone and verified that I am speaking with the correct person using two identifiers. Location patient: home Location provider: work or home office Persons participating in the virtual visit: patient, provider  I discussed the limitations, risks, security and privacy concerns of performing an evaluation and management service by telephone and the availability of in person appointments. I also discussed with the patient that there may be a patient responsible charge related to this service. The patient expressed understanding and agreed to proceed.  Interactive audio and video telecommunications were attempted between this provider and patient, however failed, due to patient having technical difficulties. Completed visit over phone after 3 failed video connection attempts.  HPI: Patient and I connected via phone after video due to scratchy throat and possible COVID-19 exposure.  Patient was at the beach in [place] last week -  she and her significant other were informed that there needed to be tested positive for COVID-19.  Patient significant other is also had a scratchy throat, cough and chest congestion.  Other than scratchy throat, she has no other symptoms.  No fever or chills, no nausea, vomiting or diarrhea.  No cough, SOB or wheeze. No GI or GU issues.    ROS: See pertinent positives and negatives per HPI.   Past Medical History:  Diagnosis Date  . Cyst, breast    benign  . H/O: rheumatic fever   . 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 Hyperlipidemia   . Hypertension   . Menopause    at age 80    Past Surgical History:  Procedure Laterality Date  . APPENDECTOMY  2006  . BREAST CYST ASPIRATION Left 1999  . CHOLECYSTECTOMY  2006  . VAGINAL DELIVERY     x3    Family History  Problem Relation Age of Onset  . Cancer Mother 1       ovarian  . Heart disease Mother 77  . Diabetes Mother   . Stroke Father 33  . Diabetes Father   . Diabetes Sister   . Breast cancer Neg Hx     Social History   Tobacco Use  . Smoking status: Former Smoker    Years: 1.00    Types: Cigarettes    Quit date: 12/26/2005    Years since quitting: 12.7  . Smokeless tobacco: Never Used  . Tobacco comment: smoked for less than 1 years,  1 cig/day  Substance Use Topics  . Alcohol use: No    Current Outpatient Medications:  .  docusate sodium (COLACE) 100 MG capsule, Take 100 mg by mouth daily., Disp: , Rfl:  .  hydrochlorothiazide (HYDRODIURIL) 12.5 MG tablet, Take 1 tablet by mouth once daily, Disp: 90 tablet, Rfl: 0 .  losartan (COZAAR) 50 MG tablet, Take 1 tablet by mouth once daily, Disp: 90 tablet,  Rfl: 0 .  lovastatin (MEVACOR) 40 MG tablet, TAKE 1 TABLET BY MOUTH AT BEDTIME, Disp: 90 tablet, Rfl: 1 .  Multiple Vitamin (MULTIVITAMIN) tablet, Take 1 tablet by mouth daily.  , Disp: , Rfl:  .  omeprazole (PRILOSEC) 20 MG capsule, Take 1 capsule by mouth once daily, Disp: 90 capsule, Rfl: 0  EXAM:  GENERAL: alert, oriented, sounds in no acute distress  LUNGS: Speaking in full sentences, no signs of respiratory distress, breathing rate appears normal, no obvious gross SOB, gasping, coughing or wheezing  PSYCH/NEURO: pleasant and cooperative, no obvious depression or anxiety, speech and thought processing grossly intact  ASSESSMENT AND PLAN:   Discussed the following assessment and plan:  Suspected COVID-19 virus infection, sore throat -- advised that due to symptoms, we need to get patient set up for COVID-19 testing.  Patient advised that a nurse will be calling her to give her time and location to drive up for drive-through testing.  Patient advised that testing is taking 2 to 7 days to result, and while we are awaiting results patient must remain under self quarantine and monitor for any changing/worsening symptoms.  Advised over-the-counter medications such as Tylenol can be used to help treat pain or fevers, Robitussin can be used to help calm cough, allergy medication such as Claritin or Allegra can help reduce congestion.  Also discussed getting plenty of rest and increasing fluid intake.  Made patient aware that test results as well as how his symptoms progress will determine when the self quarantine will be able to end.  Also advised to monitor self for any worsening symptoms, advised if severe shortness of breath develops, high fever that is not reduced with use of Tylenol, chest pain, severe vomiting or diarrhea  --patient must call on-call and or go to ER right away for evaluation. patient verbalized understanding of these instructions.    I discussed the assessment and treatment plan with the patient. The patient was provided an opportunity to ask questions and all were answered. The patient agreed with the plan and demonstrated an understanding of the instructions.   The patient was advised to call back or seek an in-person evaluation if the symptoms worsen or if the condition fails to improve as anticipated.  I provided 15 minutes of non-face-to-face time over phone during this encounter.   Jodelle Green, FNP

## 2018-09-27 ENCOUNTER — Telehealth: Payer: Self-pay

## 2018-09-27 DIAGNOSIS — Z20822 Contact with and (suspected) exposure to covid-19: Secondary | ICD-10-CM

## 2018-09-27 NOTE — Telephone Encounter (Signed)
Testing scheduled on 09/30/18.

## 2018-09-27 NOTE — Telephone Encounter (Signed)
Patient called and advised of the request for covid testing. Appointment scheduled for Monday, 09/30/18 at 1630 at [facility], advised of location and to wear a mask for everyone in the vehicle, she verbalized understanding. She asks will she and her significant other need to be on quarantine over the weekend and until the results come back, I advised yes, remain on quarantine, she verbalized understanding. Order placed.      Jodelle Green, FNP 18 hours ago (3:53 PM)     Ashlee Cox  [birth year]  [id number]  Needs for test: +covid exposure, scratchy/hoarse throat  [employer], [facility]

## 2018-09-30 ENCOUNTER — Other Ambulatory Visit: Payer: PPO

## 2018-10-01 ENCOUNTER — Other Ambulatory Visit: Payer: PPO

## 2018-10-01 DIAGNOSIS — R6889 Other general symptoms and signs: Secondary | ICD-10-CM | POA: Diagnosis not present

## 2018-10-06 LAB — NOVEL CORONAVIRUS, NAA: SARS-CoV-2, NAA: NOT DETECTED

## 2018-10-20 ENCOUNTER — Other Ambulatory Visit: Payer: Self-pay | Admitting: Internal Medicine

## 2018-11-21 LAB — HM DIABETES EYE EXAM

## 2018-12-16 ENCOUNTER — Ambulatory Visit: Payer: PPO | Admitting: Internal Medicine

## 2019-01-23 ENCOUNTER — Other Ambulatory Visit: Payer: Self-pay | Admitting: Internal Medicine

## 2019-03-12 ENCOUNTER — Other Ambulatory Visit: Payer: Self-pay | Admitting: Internal Medicine

## 2019-03-28 DIAGNOSIS — C801 Malignant (primary) neoplasm, unspecified: Secondary | ICD-10-CM

## 2019-03-28 HISTORY — DX: Malignant (primary) neoplasm, unspecified: C80.1

## 2019-03-31 ENCOUNTER — Encounter: Payer: Self-pay | Admitting: Internal Medicine

## 2019-03-31 ENCOUNTER — Other Ambulatory Visit: Payer: Self-pay

## 2019-03-31 ENCOUNTER — Ambulatory Visit (INDEPENDENT_AMBULATORY_CARE_PROVIDER_SITE_OTHER): Payer: PPO | Admitting: Internal Medicine

## 2019-03-31 VITALS — Ht 65.75 in | Wt 195.0 lb

## 2019-03-31 DIAGNOSIS — Z1231 Encounter for screening mammogram for malignant neoplasm of breast: Secondary | ICD-10-CM

## 2019-03-31 DIAGNOSIS — E083219 Diabetes mellitus due to underlying condition with mild nonproliferative diabetic retinopathy with macular edema, unspecified eye: Secondary | ICD-10-CM | POA: Diagnosis not present

## 2019-03-31 DIAGNOSIS — E785 Hyperlipidemia, unspecified: Secondary | ICD-10-CM | POA: Diagnosis not present

## 2019-03-31 DIAGNOSIS — E669 Obesity, unspecified: Secondary | ICD-10-CM | POA: Diagnosis not present

## 2019-03-31 DIAGNOSIS — E1159 Type 2 diabetes mellitus with other circulatory complications: Secondary | ICD-10-CM | POA: Diagnosis not present

## 2019-03-31 DIAGNOSIS — K5901 Slow transit constipation: Secondary | ICD-10-CM | POA: Diagnosis not present

## 2019-03-31 DIAGNOSIS — E1169 Type 2 diabetes mellitus with other specified complication: Secondary | ICD-10-CM

## 2019-03-31 DIAGNOSIS — Z0000 Encounter for general adult medical examination without abnormal findings: Secondary | ICD-10-CM | POA: Diagnosis not present

## 2019-03-31 DIAGNOSIS — I10 Essential (primary) hypertension: Secondary | ICD-10-CM

## 2019-03-31 NOTE — Assessment & Plan Note (Addendum)
Imporved with  increase in dietary fiber with goal 25 g daily

## 2019-03-31 NOTE — Progress Notes (Signed)
Virtual Visit via [email or web address]  This visit type was conducted due to national recommendations for restrictions regarding the COVID-19 pandemic (e.g. social distancing).  This format is felt to be most appropriate for this patient at this time.  All issues noted in this document were discussed and addressed.  No physical exam was performed (except for noted visual exam findings with Video Visits).   I connected with@ on 03/31/19 at  9:00 AM EST by a video enabled telemedicine application  and verified that I am speaking with the correct person using two identifiers. Location patient: home Location provider: work or home office Persons participating in the virtual visit: patient, provider  I discussed the limitations, risks, security and privacy concerns of performing an evaluation and management service by telephone and the availability of in person appointments. I also discussed with the patient that there may be a patient responsible charge related to this service. The patient expressed understanding and agreed to proceed.  Reason for visit: follow up on Type 2 DM     HPI:  69 yr old female with type 2 DM hypertension, hyperlipidemia and obesity  She and husband report a potentiAL COVID EXPOSURE  On Friday Jan 1 after spending time with her daughter who is being tested today after losing sense of taste and smell on Saturday.  Patient has no symptoms  T2 DM:  Does not check BS ,  Not exercising regularly .  Weight is stable.up to date on annual eye exams.  Denies foot pain,  Callous formation   Patient is taking her medications as prescribed and notes no adverse effects.  Home BP readings have been done about once per week and are  generally < 130/80 .  She is avoiding added salt in her diet and not  walking regularly  for exercise  . No anxiet, insomnia.  Overude for mammogram and Pvax.  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  Reviewed therapy    ROS: See  pertinent positives and negatives per HPI.  Past Medical History:  Diagnosis Date  . Cyst, breast    benign  . H/O: rheumatic fever   . 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 Hyperlipidemia   . Hypertension   . Menopause    at age 68    Past Surgical History:  Procedure Laterality Date  . APPENDECTOMY  2006  . BREAST CYST ASPIRATION Left 1999  . CHOLECYSTECTOMY  2006  . VAGINAL DELIVERY     x3    Family History  Problem Relation Age of Onset  . Cancer Mother 25       ovarian  . Heart disease Mother 75  . Diabetes Mother   . Stroke Father 26  . Diabetes Father   . Diabetes Sister   . Breast cancer Neg Hx     SOCIAL HX:  reports that she quit smoking about 13 years ago. Her smoking use included cigarettes. She quit after 1.00 year of use. She has never used smokeless tobacco. She reports that she does not drink alcohol or use drugs.   Current Outpatient Medications:  .  docusate sodium (COLACE) 100 MG capsule, Take 100 mg by mouth daily., Disp: , Rfl:  .  hydrochlorothiazide (HYDRODIURIL) 12.5 MG tablet, Take 1 tablet by mouth once daily, Disp: 90 tablet, Rfl: 0 .  losartan (COZAAR) 50 MG tablet, Take 1 tablet by  mouth once daily, Disp: 90 tablet, Rfl: 0 .  lovastatin (MEVACOR) 40 MG tablet, TAKE 1 TABLET BY MOUTH AT BEDTIME, Disp: 90 tablet, Rfl: 3 .  Multiple Vitamin (MULTIVITAMIN) tablet, Take 1 tablet by mouth daily.  , Disp: , Rfl:  .  omeprazole (PRILOSEC) 20 MG capsule, Take 1 capsule by mouth once daily, Disp: 90 capsule, Rfl: 0  EXAM:  VITALS per patient if applicable:  GENERAL: alert, oriented, appears well and in no acute distress  HEENT: atraumatic, conjunttiva clear, no obvious abnormalities on inspection of external nose and ears  NECK: normal movements of the head and neck  LUNGS: on inspection no signs of respiratory distress, breathing rate appears normal, no obvious gross SOB, gasping or  wheezing  CV: no obvious cyanosis  MS: moves all visible extremities without noticeable abnormality  PSYCH/NEURO: pleasant and cooperative, no obvious depression or anxiety, speech and thought processing grossly intact  ASSESSMENT AND PLAN:  Discussed the following assessment and plan:  Hyperlipidemia with target LDL less than 100 - Plan: TSH, Lipid panel  Hypertension, unspecified type - Plan: Comprehensive metabolic panel  Obesity, diabetes, and hypertension syndrome (HCC) - Plan: Hemoglobin A1c, Microalbumin / creatinine urine ratio  Encounter for screening mammogram for breast cancer - Plan: 3d mammogram [facility]  Slow transit constipation  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  I have personally reviewed and have noted:  1) the patient's medical and social history 2) The pt's use of alcohol, tobacco, and illicit drugs 3) The patient's current medications and supplements 4) Functional ability including ADL's, fall risk, home safety risk, hearing and visual impairment 5) Diet and physical activities 6) Evidence for depression or mood disorder 7) The patient's height, weight, and BMI have been recorded in the chart  I have made referrals, and provided counseling and education based on review of the above  Hypertension Well controlled on current regimen. Renal function is due , no changes today.  Hyperlipidemia with target LDL less than  100 Tolerating lovastatin.  LDL is at goal. Recommending exercise for reduction in triglycerides  Lab Results  Component Value Date   CHOL 174 06/13/2018   HDL 46.80 06/13/2018   LDLCALC 66 12/13/2017   LDLDIRECT 82.0 06/13/2018   TRIG 244.0 (H) 06/13/2018   CHOLHDL 4 06/13/2018     Obesity, diabetes, and hypertension syndrome (HCC) Historically well-controlled on diet alone .  hemoglobin A1c has been consistently at or  less than 7.0 . Patient is up-to-date on eye exams and foot exam is normal today. Patient has no microalbuminuria. Patient is tolerating statin therapy for CAD risk reduction and on ARB for renal protection and hypertension   Lab Results  Component Value Date   MICROALBUR <0.7 12/13/2017   Lab Results  Component Value Date   HGBA1C 6.4 06/13/2018       I discussed the assessment and treatment plan with the patient. The patient was provided an opportunity to ask questions and all were answered. The patient agreed with the plan and demonstrated an understanding of the instructions.   The patient  was advised to call back or seek an in-person evaluation if the symptoms worsen or if the condition fails to improve as anticipated.  I provided  30 minutes of non-face-to-face time during this encounter reviewing patient's current problems and past procedures/imaging studies, providing counseling on the above mentioned problems , and coordination  of care .  [name], MD

## 2019-03-31 NOTE — Assessment & Plan Note (Signed)
Tolerating lovastatin.  LDL is at goal. Recommending exercise for reduction in triglycerides  Lab Results  Component Value Date   CHOL 174 06/13/2018   HDL 46.80 06/13/2018   LDLCALC 66 12/13/2017   LDLDIRECT 82.0 06/13/2018   TRIG 244.0 (H) 06/13/2018   CHOLHDL 4 06/13/2018

## 2019-03-31 NOTE — Assessment & Plan Note (Signed)
Historically well-controlled on diet alone .  hemoglobin A1c has been consistently at or  less than 7.0 . Patient is up-to-date on eye exams and foot exam is normal today. Patient has no microalbuminuria. Patient is tolerating statin therapy for CAD risk reduction and on ARB for renal protection and hypertension   Lab Results  Component Value Date   MICROALBUR <0.7 12/13/2017   Lab Results  Component Value Date   HGBA1C 6.4 06/13/2018

## 2019-03-31 NOTE — Assessment & Plan Note (Signed)
Well controlled on current regimen. Renal function is due, no changes today. °

## 2019-03-31 NOTE — Assessment & Plan Note (Signed)
Reminded to Continue annual eye exams

## 2019-03-31 NOTE — Assessment & Plan Note (Signed)

## 2019-04-15 ENCOUNTER — Encounter: Payer: Self-pay | Admitting: Internal Medicine

## 2019-04-16 ENCOUNTER — Other Ambulatory Visit: Payer: Self-pay | Admitting: Internal Medicine

## 2019-04-24 ENCOUNTER — Other Ambulatory Visit (INDEPENDENT_AMBULATORY_CARE_PROVIDER_SITE_OTHER): Payer: PPO

## 2019-04-24 ENCOUNTER — Ambulatory Visit (INDEPENDENT_AMBULATORY_CARE_PROVIDER_SITE_OTHER): Payer: PPO

## 2019-04-24 ENCOUNTER — Other Ambulatory Visit: Payer: Self-pay

## 2019-04-24 DIAGNOSIS — E1159 Type 2 diabetes mellitus with other circulatory complications: Secondary | ICD-10-CM

## 2019-04-24 DIAGNOSIS — E785 Hyperlipidemia, unspecified: Secondary | ICD-10-CM | POA: Diagnosis not present

## 2019-04-24 DIAGNOSIS — I10 Essential (primary) hypertension: Secondary | ICD-10-CM | POA: Diagnosis not present

## 2019-04-24 DIAGNOSIS — E1169 Type 2 diabetes mellitus with other specified complication: Secondary | ICD-10-CM

## 2019-04-24 DIAGNOSIS — E669 Obesity, unspecified: Secondary | ICD-10-CM

## 2019-04-24 DIAGNOSIS — Z23 Encounter for immunization: Secondary | ICD-10-CM | POA: Diagnosis not present

## 2019-04-24 LAB — COMPREHENSIVE METABOLIC PANEL
ALT: 46 U/L — ABNORMAL HIGH (ref 0–35)
AST: 42 U/L — ABNORMAL HIGH (ref 0–37)
Albumin: 4.2 g/dL (ref 3.5–5.2)
Alkaline Phosphatase: 80 U/L (ref 39–117)
BUN: 16 mg/dL (ref 6–23)
CO2: 28 mEq/L (ref 19–32)
Calcium: 9.8 mg/dL (ref 8.4–10.5)
Chloride: 102 mEq/L (ref 96–112)
Creatinine, Ser: 0.86 mg/dL (ref 0.40–1.20)
GFR: 65.92 mL/min (ref >60.00)
Glucose, Bld: 174 mg/dL — ABNORMAL HIGH (ref 70–99)
Potassium: 4.1 mEq/L (ref 3.5–5.1)
Sodium: 139 mEq/L (ref 135–145)
Total Bilirubin: 0.7 mg/dL (ref 0.2–1.2)
Total Protein: 7.4 g/dL (ref 6.0–8.3)

## 2019-04-24 LAB — HEMOGLOBIN A1C: Hgb A1c MFr Bld: 7.9 % — ABNORMAL HIGH (ref 4.6–6.5)

## 2019-04-24 LAB — LIPID PANEL
Cholesterol: 173 mg/dL (ref 0–200)
HDL: 51.1 mg/dL (ref >39.00)
LDL Cholesterol: 88 mg/dL (ref 0–99)
NonHDL: 121.67
Total CHOL/HDL Ratio: 3
Triglycerides: 166 mg/dL — ABNORMAL HIGH (ref 0.0–149.0)
VLDL: 33.2 mg/dL (ref 0.0–40.0)

## 2019-04-24 LAB — TSH: TSH: 1.92 u[IU]/mL (ref 0.35–4.50)

## 2019-04-24 LAB — MICROALBUMIN / CREATININE URINE RATIO
Creatinine,U: 93 mg/dL
Microalb Creat Ratio: 1 mg/g (ref 0.0–30.0)
Microalb, Ur: 0.9 mg/dL (ref 0.0–1.9)

## 2019-04-30 ENCOUNTER — Other Ambulatory Visit: Payer: Self-pay | Admitting: Internal Medicine

## 2019-04-30 MED ORDER — METFORMIN HCL 500 MG PO TABS: 500.0000 mg | ORAL_TABLET | Freq: Two times a day (BID) | ORAL | 3 refills | Status: DC

## 2019-05-05 ENCOUNTER — Encounter: Payer: Self-pay | Admitting: Internal Medicine

## 2019-05-05 ENCOUNTER — Ambulatory Visit (INDEPENDENT_AMBULATORY_CARE_PROVIDER_SITE_OTHER): Payer: PPO | Admitting: Internal Medicine

## 2019-05-05 ENCOUNTER — Other Ambulatory Visit: Payer: Self-pay

## 2019-05-05 VITALS — Ht 65.75 in | Wt 195.0 lb

## 2019-05-05 DIAGNOSIS — K921 Melena: Secondary | ICD-10-CM | POA: Diagnosis not present

## 2019-05-05 DIAGNOSIS — E669 Obesity, unspecified: Secondary | ICD-10-CM

## 2019-05-05 DIAGNOSIS — E1159 Type 2 diabetes mellitus with other circulatory complications: Secondary | ICD-10-CM | POA: Diagnosis not present

## 2019-05-05 DIAGNOSIS — R7989 Other specified abnormal findings of blood chemistry: Secondary | ICD-10-CM

## 2019-05-05 DIAGNOSIS — I10 Essential (primary) hypertension: Secondary | ICD-10-CM

## 2019-05-05 DIAGNOSIS — M8580 Other specified disorders of bone density and structure, unspecified site: Secondary | ICD-10-CM

## 2019-05-05 DIAGNOSIS — E559 Vitamin D deficiency, unspecified: Secondary | ICD-10-CM

## 2019-05-05 DIAGNOSIS — K5904 Chronic idiopathic constipation: Secondary | ICD-10-CM

## 2019-05-05 DIAGNOSIS — Z780 Asymptomatic menopausal state: Secondary | ICD-10-CM

## 2019-05-05 DIAGNOSIS — E1169 Type 2 diabetes mellitus with other specified complication: Secondary | ICD-10-CM

## 2019-05-05 DIAGNOSIS — E785 Hyperlipidemia, unspecified: Secondary | ICD-10-CM | POA: Diagnosis not present

## 2019-05-05 NOTE — Assessment & Plan Note (Signed)
Tolerating lovastatin.  LDL is at goal. Recommending exercise and weight loss  for reduction in triglycerides ° °Lab Results  °Component Value Date  ° CHOL 173 04/24/2019  ° HDL 51.10 04/24/2019  ° LDLCALC 88 04/24/2019  ° LDLDIRECT 82.0 06/13/2018  ° TRIG 166.0 (H) 04/24/2019  ° CHOLHDL 3 04/24/2019  ° ° °

## 2019-05-05 NOTE — Assessment & Plan Note (Signed)
Recommend low GI diet using Weight Watcher.s  Continue metformin, lovastatin .  Repeat a1c in 3 months  Lab Results  Component Value Date   HGBA1C 7.9 (H) 04/24/2019   Lab Results  Component Value Date   MICROALBUR 0.9 04/24/2019   MICROALBUR <0.7 12/13/2017

## 2019-05-05 NOTE — Progress Notes (Signed)
Virtual Visit via [email or web address]  This visit type was conducted due to national recommendations for restrictions regarding the COVID-19 pandemic (e.g. social distancing).  This format is felt to be most appropriate for this patient at this time.  All issues noted in this document were discussed and addressed.  No physical exam was performed (except for noted visual exam findings with Video Visits).   I connected with@ on 05/05/19 at  9:30 AM EST by a video enabled telemedicine application  and verified that I am speaking with the correct person using two identifiers. Location patient: home Location provider: work or home office Persons participating in the virtual visit: patient, provider  I discussed the limitations, risks, security and privacy concerns of performing an evaluation and management service by telephone and the availability of in person appointments. I also discussed with the patient that there may be a patient responsible charge related to this service. The patient expressed understanding and agreed to proceed.  Reason for visit: abnormal labs   69 yr old female with history of diet controlled DM,  , obesity and hyperlipidemia presents for follow up on labs    T2DM:  Has been taking metformin for < 7 days. Notes that her fasting sugars already improving by 20 points   from 188 to 168.  Diet discussed.  Hs eis considering resuming Weight Watchers since this has worked for her in the past  Elevated LFTs: mild  Dooes not drink.  Discussed potential source being fatty liver   Hematochezia:  Several episodes,  Since starting metformin,  None in  the last 2 days. She takes a laxative daily for chronic constipation  And stools have been more soft than usual since starting metformin,  Denies rectal pain.     ROS: See pertinent positives and negatives per HPI.  Past Medical History:  Diagnosis Date  . Cyst, breast    benign  . H/O: rheumatic fever   . History of colonoscopy 2010   done  bc of bleeding,  normal , due back in 5 yrs [employer])  . Hyperlipidemia   . Hypertension   . Menopause    at age 75    Past Surgical History:  Procedure Laterality Date  . APPENDECTOMY  2006  . BREAST CYST ASPIRATION Left 1999  . CHOLECYSTECTOMY  2006  . VAGINAL DELIVERY     x3    Family History  Problem Relation Age of Onset  . Cancer Mother 72       ovarian  . Heart disease Mother 66  . Diabetes Mother   . Stroke Father 27  . Diabetes Father   . Diabetes Sister   . Breast cancer Neg Hx    SOCIAL HX:  reports that she quit smoking about 13 years ago. Her smoking use included cigarettes. She quit after 1.00 year of use. She has never used smokeless tobacco. She reports that she does not drink alcohol or use drugs.   Current Outpatient Medications:  .  APPLE CIDER VINEGAR PO, Take 450 mg by mouth daily., Disp: , Rfl:  .  docusate sodium (COLACE) 100 MG capsule, Take 100 mg by mouth daily., Disp: , Rfl:  .  hydrochlorothiazide (HYDRODIURIL) 12.5 MG tablet, Take 1 tablet by mouth once daily, Disp: 90 tablet, Rfl: 0 .  losartan (COZAAR) 50 MG tablet, Take 1 tablet by mouth once daily, Disp: 90 tablet, Rfl: 0 .  lovastatin (MEVACOR) 40 MG tablet, TAKE 1 TABLET BY MOUTH AT BEDTIME, Disp: 90  tablet, Rfl: 3 .  metFORMIN (GLUCOPHAGE) 500 MG tablet, Take 1 tablet (500 mg total) by mouth 2 (two) times daily with a meal., Disp: 180 tablet, Rfl: 3 .  Multiple Vitamin (MULTIVITAMIN) tablet, Take 1 tablet by mouth daily.  , Disp: , Rfl:  .  omeprazole (PRILOSEC) 20 MG capsule, Take 1 capsule by mouth once daily, Disp: 90 capsule, Rfl: 0  EXAM:  VITALS per patient if applicable:  GENERAL: alert, oriented, appears well and in no acute distress  HEENT: atraumatic, conjunttiva clear, no obvious abnormalities on inspection of external nose and ears  NECK: normal movements of the head and neck  LUNGS: on inspection no signs of respiratory distress, breathing rate appears normal, no  obvious gross SOB, gasping or wheezing  CV: no obvious cyanosis  MS: moves all visible extremities without noticeable abnormality  PSYCH/NEURO: pleasant and cooperative, no obvious depression or anxiety, speech and thought processing grossly intact  ASSESSMENT AND PLAN:  Discussed the following assessment and plan:  Elevated LFTs - Plan: Comprehensive metabolic panel, CBC with Differential/Platelet, Hepatitis C antibody  Osteopenia after menopause  Obesity, diabetes, and hypertension syndrome (HCC) - Plan: Hemoglobin A1c  Vitamin D deficiency - Plan: VITAMIN D 25 Hydroxy (Vit-D Deficiency, Fractures)  Hematochezia  Chronic idiopathic constipation  Hyperlipidemia with target LDL less than 100  Elevated LFTs Mild,,  Likely fatty liver.  Will screen for hep c with next repeat in 3 months   Obesity, diabetes, and hypertension syndrome ([place]) Recommend low GI diet using Weight Watcher.s  Continue metformin, lovastatin .  Repeat a1c in 3 months  Lab Results  Component Value Date   HGBA1C 7.9 (H) 04/24/2019   Lab Results  Component Value Date   MICROALBUR 0.9 04/24/2019   MICROALBUR <0.7 12/13/2017       Hematochezia Recommend stopping laxative while taking metformin.  If bleeding rcurs,, GI referral needed for colonoscopy (last one remote,  Had cologuard negative sept 2019)  Constipation Imporved with  increase in dietary fiber with goal 25 g daily .  Stop laxative   Hyperlipidemia with target LDL less than 100 Tolerating lovastatin.  LDL is at goal. Recommending exercise and weight loss  for reduction in triglycerides  Lab Results  Component Value Date   CHOL 173 04/24/2019   HDL 51.10 04/24/2019   LDLCALC 88 04/24/2019   LDLDIRECT 82.0 06/13/2018   TRIG 166.0 (H) 04/24/2019   CHOLHDL 3 04/24/2019       I discussed the assessment and treatment plan with the patient. The patient was provided an opportunity to ask questions and all were answered. The patient  agreed with the plan and demonstrated an understanding of the instructions.   The patient was advised to call back or seek an in-person evaluation if the symptoms worsen or if the condition fails to improve as anticipated.    I provided  30 minutes of non-face-to-face time during this encounter reviewing patient's current problems and post surgeries.  Providing counseling on the above mentioned problems , and coordination  of care .  [name], MD

## 2019-05-05 NOTE — Assessment & Plan Note (Signed)
Recommend stopping laxative while taking metformin.  If bleeding rcurs,, GI referral needed for colonoscopy (last one remote,  Had cologuard negative sept 2019)

## 2019-05-05 NOTE — Patient Instructions (Addendum)
Suspend your laxative  Continue metformin  If blood in stool continues,  Call for GI referral  We should Repeat non fasting labs on or after April 28 (office will call)   Nonalcoholic Fatty Liver Disease Diet, Adult Nonalcoholic fatty liver disease is a condition that causes fat to build up in and around the liver. The disease makes it harder for the liver to work the way that it should. Following a healthy diet can help to keep nonalcoholic fatty liver disease under control. It can also help to prevent or improve conditions that are associated with the disease, such as heart disease, diabetes, high blood pressure, and abnormal cholesterol levels. Along with regular exercise, this diet:  Promotes weight loss.  Helps to control blood sugar levels.  Helps to improve the way that the body uses insulin. What are tips for following this plan? Reading food labels Always check food labels for:  The amount of saturated fat in a food. You should limit your intake of saturated fat. Saturated fat is found in foods that come from animals, including meat and dairy products such as butter, cheese, and whole milk.  The amount of fiber in a food. You should choose high-fiber foods such as fruits, vegetables, and whole grains. Try to get 25-30 grams (g) of fiber a day.  Cooking  When cooking, use heart-healthy oils that are high in monounsaturated fats. These include olive oil, canola oil, and avocado oil.  Limit frying or deep-frying foods. Cook foods using healthy methods such as baking, boiling, steaming, and grilling instead. Meal planning  You may want to keep track of how many calories you take in. Eating the right amount of calories will help you achieve a healthy weight. Meeting with a registered dietitian can help you get started.  Limit how often you eat takeout and fast food. These foods are usually very high in fat, salt, and sugar.  Use the glycemic index (GI) to plan your meals. The  index tells you how quickly a food will raise your blood sugar. Choose low-GI foods (GI less than 55). These foods take a longer time to raise blood sugar. A registered dietitian can help you identify foods lower on the GI scale. Lifestyle  You may want to follow a Mediterranean diet. This diet includes a lot of vegetables, lean meats or fish, whole grains, fruits, and healthy oils and fats. What foods can I eat?  Fruits Bananas. Apples. Oranges. Grapes. Papaya. Mango. Pomegranate. Kiwi. Grapefruit. Cherries. Vegetables Lettuce. Spinach. Peas. Beets. Cauliflower. Cabbage. Broccoli. Carrots. Tomatoes. Squash. Eggplant. Herbs. Peppers. Onions. Cucumbers. Brussels sprouts. Yams and sweet potatoes. Beans. Lentils. Grains Whole wheat or whole-grain foods, including breads, crackers, cereals, and pasta. Stone-ground whole wheat. Unsweetened oatmeal. Bulgur. Barley. Quinoa. Brown or wild rice. Corn or whole wheat flour tortillas. Meats and other proteins Lean meats. Poultry. Tofu. Seafood and shellfish. Dairy Low-fat or fat-free dairy products, such as yogurt, cottage cheese, or cheese. Beverages Water. Sugar-free drinks. Tea. Coffee. Low-fat or skim milk. Milk alternatives, such as soy or almond milk. Real fruit juice. Fats and oils Avocado. Canola or olive oil. Nuts and nut butters. Seeds. Seasonings and condiments Mustard. Relish. Low-fat, low-sugar ketchup and barbecue sauce. Low-fat or fat-free mayonnaise. Sweets and desserts Sugar-free sweets. The items listed above may not be a complete list of foods and beverages you can eat. Contact a dietitian for more information. What foods should I limit or avoid? Meats and other proteins Limit red meat to 1-2 times  a week. Dairy [employer]. Fats and oils Palm oil and coconut oil. Fried foods. Other foods Processed foods. Foods that contain a lot of salt or sodium. Sweets and desserts Sweets that contain sugar. Beverages Sweetened  drinks, such as sweet tea, milkshakes, iced sweet drinks, and sodas. Alcohol. The items listed above may not be a complete list of foods and beverages you should avoid. Contact a dietitian for more information. Where to find more information The [employer] of Diabetes and Digestive and Kidney Diseases: [employer] Summary  Nonalcoholic fatty liver disease is a condition that causes fat to build up in and around the liver.  Following a healthy diet can help to keep nonalcoholic fatty liver disease under control. Your diet should be rich in fruits, vegetables, whole grains, and lean proteins.  Limit your intake of saturated fat. Saturated fat is found in foods that come from animals, including meat and dairy products such as butter, cheese, and whole milk.  This diet promotes weight loss, helps to control blood sugar levels, and helps to improve the way that the body uses insulin. This information is not intended to replace advice given to you by your health care provider. Make sure you discuss any questions you have with your health care provider. Document Revised: 07/05/2018 Document Reviewed: 04/04/2018 Elsevier Patient Education  [place].

## 2019-05-05 NOTE — Assessment & Plan Note (Signed)
Imporved with  increase in dietary fiber with goal 25 g daily .  Stop laxative

## 2019-05-05 NOTE — Progress Notes (Signed)
Pt stated that since starting the Metformin she has noticed some blood in her stool. She stated that it isn't every day that she sees it.

## 2019-05-05 NOTE — Assessment & Plan Note (Signed)
Mild,,  Likely fatty liver.  Will screen for hep c with next repeat in 3 months

## 2019-05-23 ENCOUNTER — Ambulatory Visit: Payer: PPO | Attending: Internal Medicine

## 2019-05-23 DIAGNOSIS — Z23 Encounter for immunization: Secondary | ICD-10-CM

## 2019-05-23 NOTE — Progress Notes (Signed)
   Covid-19 Vaccination Clinic  Name:  Ashlee Cox    MRN: [id number] DOB: 1950-11-12  05/23/2019  [name] was observed post Covid-19 immunization for 15 minutes without incidence. She was provided with Vaccine Information Sheet and instruction to access the V-Safe system.   [name] was instructed to call 911 with any severe reactions post vaccine: Marland Kitchen Difficulty breathing  . Swelling of your face and throat  . A fast heartbeat  . A bad rash all over your body  . Dizziness and weakness    Immunizations Administered    Name Date Dose VIS Date Route   Pfizer COVID-19 Vaccine 05/23/2019 11:28 AM 0.3 mL 03/07/2019 Intramuscular   Manufacturer: [place]   Lot: [id number]   [employer]: [id number]

## 2019-06-17 ENCOUNTER — Ambulatory Visit: Payer: PPO | Attending: Internal Medicine

## 2019-06-17 DIAGNOSIS — Z23 Encounter for immunization: Secondary | ICD-10-CM

## 2019-06-17 NOTE — Progress Notes (Signed)
   Covid-19 Vaccination Clinic  Name:  Ashlee Cox    MRN: [id number] DOB: [birth year]  06/17/2019  Ashlee Cox was observed post Covid-19 immunization for 15 minutes without incident. She was provided with Vaccine Information Sheet and instruction to access the V-Safe system.   Ashlee Cox was instructed to call 911 with any severe reactions post vaccine: Marland Kitchen Difficulty breathing  . Swelling of face and throat  . A fast heartbeat  . A bad rash all over body  . Dizziness and weakness   Immunizations Administered    Name Date Dose VIS Date Route   Pfizer COVID-19 Vaccine 06/17/2019  4:15 PM 0.3 mL 03/07/2019 Intramuscular   Manufacturer: [place]   Lot: [id number]   [place]: [id number]

## 2019-07-22 ENCOUNTER — Other Ambulatory Visit: Payer: Self-pay | Admitting: Internal Medicine

## 2019-07-25 ENCOUNTER — Other Ambulatory Visit: Payer: PPO

## 2019-07-30 ENCOUNTER — Other Ambulatory Visit: Payer: Self-pay

## 2019-08-26 ENCOUNTER — Other Ambulatory Visit (INDEPENDENT_AMBULATORY_CARE_PROVIDER_SITE_OTHER): Payer: PPO

## 2019-08-26 ENCOUNTER — Other Ambulatory Visit: Payer: Self-pay

## 2019-08-26 DIAGNOSIS — E1159 Type 2 diabetes mellitus with other circulatory complications: Secondary | ICD-10-CM

## 2019-08-26 DIAGNOSIS — I10 Essential (primary) hypertension: Secondary | ICD-10-CM

## 2019-08-26 DIAGNOSIS — R7989 Other specified abnormal findings of blood chemistry: Secondary | ICD-10-CM | POA: Diagnosis not present

## 2019-08-26 DIAGNOSIS — E559 Vitamin D deficiency, unspecified: Secondary | ICD-10-CM

## 2019-08-26 DIAGNOSIS — E669 Obesity, unspecified: Secondary | ICD-10-CM

## 2019-08-26 DIAGNOSIS — E119 Type 2 diabetes mellitus without complications: Secondary | ICD-10-CM

## 2019-08-26 DIAGNOSIS — E1169 Type 2 diabetes mellitus with other specified complication: Secondary | ICD-10-CM | POA: Diagnosis not present

## 2019-08-26 LAB — COMPREHENSIVE METABOLIC PANEL
ALT: 39 U/L — ABNORMAL HIGH (ref 0–35)
AST: 31 U/L (ref 0–37)
Albumin: 4.4 g/dL (ref 3.5–5.2)
Alkaline Phosphatase: 74 U/L (ref 39–117)
BUN: 16 mg/dL (ref 6–23)
CO2: 27 mEq/L (ref 19–32)
Calcium: 9.8 mg/dL (ref 8.4–10.5)
Chloride: 101 mEq/L (ref 96–112)
Creatinine, Ser: 0.77 mg/dL (ref 0.40–1.20)
GFR: 74.81 mL/min (ref >60.00)
Glucose, Bld: 130 mg/dL — ABNORMAL HIGH (ref 70–99)
Potassium: 3.8 mEq/L (ref 3.5–5.1)
Sodium: 139 mEq/L (ref 135–145)
Total Bilirubin: 0.6 mg/dL (ref 0.2–1.2)
Total Protein: 7.5 g/dL (ref 6.0–8.3)

## 2019-08-26 LAB — CBC WITH DIFFERENTIAL/PLATELET
Basophils Absolute: 0.1 10*3/uL (ref 0.0–0.1)
Basophils Relative: 0.9 % (ref 0.0–3.0)
Eosinophils Absolute: 0.2 10*3/uL (ref 0.0–0.7)
Eosinophils Relative: 2.2 % (ref 0.0–5.0)
HCT: 42.8 % (ref 36.0–46.0)
Hemoglobin: 14.2 g/dL (ref 12.0–15.0)
Lymphocytes Relative: 26.7 % (ref 12.0–46.0)
Lymphs Abs: 2 10*3/uL (ref 0.7–4.0)
MCHC: 33.3 g/dL (ref 30.0–36.0)
MCV: 86.6 fl (ref 78.0–100.0)
Monocytes Absolute: 0.4 10*3/uL (ref 0.1–1.0)
Monocytes Relative: 4.9 % (ref 3.0–12.0)
Neutro Abs: 4.9 10*3/uL (ref 1.4–7.7)
Neutrophils Relative %: 65.3 % (ref 43.0–77.0)
Platelets: 391 10*3/uL (ref 150.0–400.0)
RBC: 4.94 Mil/uL (ref 3.87–5.11)
RDW: 14.2 % (ref 11.5–15.5)
WBC: 7.5 10*3/uL (ref 4.0–10.5)

## 2019-08-26 LAB — VITAMIN D 25 HYDROXY (VIT D DEFICIENCY, FRACTURES): VITD: 50.9 ng/mL (ref 30.00–100.00)

## 2019-08-26 LAB — HEMOGLOBIN A1C: Hgb A1c MFr Bld: 6.9 % — ABNORMAL HIGH (ref 4.6–6.5)

## 2019-08-27 LAB — HEPATITIS C ANTIBODY
Hepatitis C Ab: NONREACTIVE
SIGNAL TO CUT-OFF: 0.02 (ref <1.00)

## 2019-09-09 ENCOUNTER — Other Ambulatory Visit: Payer: Self-pay

## 2019-09-10 ENCOUNTER — Ambulatory Visit (INDEPENDENT_AMBULATORY_CARE_PROVIDER_SITE_OTHER): Payer: PPO | Admitting: Internal Medicine

## 2019-09-10 ENCOUNTER — Encounter: Payer: Self-pay | Admitting: Internal Medicine

## 2019-09-10 VITALS — BP 142/80 | HR 80 | Temp 97.8°F | Resp 16 | Ht 65.75 in | Wt 191.8 lb

## 2019-09-10 DIAGNOSIS — E785 Hyperlipidemia, unspecified: Secondary | ICD-10-CM | POA: Diagnosis not present

## 2019-09-10 DIAGNOSIS — N6310 Unspecified lump in the right breast, unspecified quadrant: Secondary | ICD-10-CM | POA: Diagnosis not present

## 2019-09-10 DIAGNOSIS — E1159 Type 2 diabetes mellitus with other circulatory complications: Secondary | ICD-10-CM

## 2019-09-10 DIAGNOSIS — E1169 Type 2 diabetes mellitus with other specified complication: Secondary | ICD-10-CM | POA: Diagnosis not present

## 2019-09-10 DIAGNOSIS — I10 Essential (primary) hypertension: Secondary | ICD-10-CM

## 2019-09-10 DIAGNOSIS — E669 Obesity, unspecified: Secondary | ICD-10-CM | POA: Diagnosis not present

## 2019-09-10 DIAGNOSIS — E119 Type 2 diabetes mellitus without complications: Secondary | ICD-10-CM

## 2019-09-10 MED ORDER — LOSARTAN POTASSIUM-HCTZ 50-12.5 MG PO TABS
1.0000 | ORAL_TABLET | Freq: Every day | ORAL | 3 refills | Status: DC
Start: 2019-10-17 — End: 2020-10-11

## 2019-09-10 NOTE — Progress Notes (Signed)
Subjective:  Patient ID: Ashlee Cox, female    DOB: 01/02/1951  Age: 69 y.o. MRN: [id number]  CC: There were no encounter diagnoses.  HPI Ashlee Cox presents for follow up on type 2 DM and  evaluation of right breast pain.  This visit occurred during the SARS-CoV-2 public health emergency.  Safety protocols were in place, including screening questions prior to the visit, additional usage of staff PPE, and extensive cleaning of exam room while observing appropriate contact time as indicated for disinfecting solutions.    Patient has received both doses of the [place] 19 vaccine without complications.  Patient continues to mask when outside of the home except when walking in yard or at safe distances from others .  Patient denies any change in mood or development of unhealthy behaviors resuting from the pandemic's restriction of activities and socialization.      Cc: right sided breast pain has been present for 3 weeks.  LATERAL SIDE radiates to right axilla.  Last mammogram June 2019.  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    DM Type 2 :  She has gained weight over the last 12 months due to dietary non adherence and lack of exercise.  She has and has recently resumed a low GI diet diet to manage weight gain.  Fasting sugars have been 150  Range .   HTN:  Patient is taking her medications as prescribed and notes no adverse effects.  Home BP readings have not been done.  She is avoiding added salt in her diet and walking regularly about 3 times per week for exercise  . Not checking BP  Outpatient Medications Prior to Visit  Medication Sig Dispense Refill  . APPLE CIDER VINEGAR PO Take 450 mg by mouth daily.    Marland Kitchen docusate sodium (COLACE) 100 MG capsule Take 100 mg by mouth daily.    . hydrochlorothiazide (HYDRODIURIL) 12.5 MG tablet Take 1 tablet by mouth once daily 90 tablet 0  . losartan (COZAAR) 50 MG tablet Take 1  tablet by mouth once daily 90 tablet 0  . lovastatin (MEVACOR) 40 MG tablet TAKE 1 TABLET BY MOUTH AT BEDTIME 90 tablet 3  . metFORMIN (GLUCOPHAGE) 500 MG tablet Take 1 tablet (500 mg total) by mouth 2 (two) times daily with a meal. 180 tablet 3  . Multiple Vitamin (MULTIVITAMIN) tablet Take 1 tablet by mouth daily.      Marland Kitchen omeprazole (PRILOSEC) 20 MG capsule Take 1 capsule by mouth once daily 90 capsule 0   No facility-administered medications prior to visit.    Review of Systems;  Patient denies headache, fevers, malaise, unintentional weight loss, skin rash, eye pain, sinus congestion and sinus pain, sore throat, dysphagia,  hemoptysis , cough, dyspnea, wheezing, chest pain, palpitations, orthopnea, edema, abdominal pain, nausea, melena, diarrhea, constipation, flank pain, dysuria, hematuria, urinary  Frequency, nocturia, numbness, tingling, seizures,  Focal weakness, Loss of consciousness,  Tremor, insomnia, depression, anxiety, and suicidal ideation.      Objective:  BP (!) 142/80 (BP Location: Left Arm, Patient Position: Sitting, Cuff Size: Normal)   Pulse 80   Temp 97.8 F (36.6 C) (Temporal)   Resp 16   Ht 5' 5.75" (1.67 m)   Wt 191 lb 12.8 oz (87 kg)   SpO2 97%   BMI 31.19 kg/m   BP Readings from Last 3 Encounters:  09/10/19 (!) 142/80  06/13/18  132/82  12/13/17 126/68    Wt Readings from Last 3 Encounters:  09/10/19 191 lb 12.8 oz (87 kg)  05/05/19 195 lb (88.5 kg)  03/31/19 195 lb (88.5 kg)    General appearance: alert, cooperative and appears stated age Ears: normal TM's and external ear canals both ears Throat: lips, mucosa, and tongue normal; teeth and gums normal Neck: no adenopathy, no carotid bruit, supple, symmetrical, trachea midline and thyroid not enlarged, symmetric, no tenderness/mass/nodules Back: symmetric, no curvature. ROM normal. No CVA tenderness. Breast:  Right breast with fullness in upper quadrants.  Tender mass at 10:00 position approx 2 cm  size.  Left breast normal. No axillary mass Lungs: clear to auscultation bilaterally Heart: regular rate and rhythm, S1, S2 normal, no murmur, click, rub or gallop Abdomen: soft, non-tender; bowel sounds normal; no masses,  no organomegaly Pulses: 2+ and symmetric Skin: Skin color, texture, turgor normal. No rashes or lesions Lymph nodes: Cervical, supraclavicular, and axillary nodes normal.  Lab Results  Component Value Date   HGBA1C 6.9 (H) 08/26/2019   HGBA1C 7.9 (H) 04/24/2019   HGBA1C 6.4 06/13/2018    Lab Results  Component Value Date   CREATININE 0.77 08/26/2019   CREATININE 0.86 04/24/2019   CREATININE 0.87 06/13/2018    Lab Results  Component Value Date   WBC 7.5 08/26/2019   HGB 14.2 08/26/2019   HCT 42.8 08/26/2019   PLT 391.0 08/26/2019   GLUCOSE 130 (H) 08/26/2019   CHOL 173 04/24/2019   TRIG 166.0 (H) 04/24/2019   HDL 51.10 04/24/2019   LDLDIRECT 82.0 06/13/2018   LDLCALC 88 04/24/2019   ALT 39 (H) 08/26/2019   AST 31 08/26/2019   NA 139 08/26/2019   K 3.8 08/26/2019   CL 101 08/26/2019   CREATININE 0.77 08/26/2019   BUN 16 08/26/2019   CO2 27 08/26/2019   TSH 1.92 04/24/2019   HGBA1C 6.9 (H) 08/26/2019   MICROALBUR 0.9 04/24/2019    DG Bone Density  Result Date: 03/18/2018 EXAM: DUAL X-RAY ABSORPTIOMETRY (DXA) FOR BONE MINERAL DENSITY IMPRESSION: Technologist: MTB Your patient Ashlee Cox a BMD test on 03/18/2018 using the [place] (analysis version: 14.10) manufactured by [employer]. The following summarizes the results of our evaluation. PATIENT BIOGRAPHICAL: Name: Ashlee Cox, Ashlee Cox Patient ID: [id number] Birth Date: 26-Dec-1950 Height: 65.7 in. Gender: Female Exam Date: 03/18/2018 Weight: 185.0 lbs. Indications: Caucasian, Diabetic, Postmenopausal Fractures: Treatments: Multi-Vitamin ASSESSMENT: The BMD measured at AP Spine L1-L3 is 1.042 g/cm2 with a T-score of -1.1. This patient is considered osteopenic according to [facility]) criteria. the quality of the scan is good. L-4 was excluded due to degenerative changes. Site Region Measured Measured WHO Young Adult BMD Date       Age      Classification T-score AP Spine L1-L3 03/18/2018 65.3 Osteopenia -1.1 1.042 g/cm2 DualFemur Neck Left 03/18/2018 65.3 Normal -0.7 0.943 g/cm2 DualFemur Total Mean 03/18/2018 65.3 Normal -0.4 0.962 g/cm2 World Health Organization [facility]) criteria for post-menopausal, Caucasian Women: Normal:       T-score at or above -1 SD Osteopenia:   T-score between -1 and -2.5 SD Osteoporosis: T-score at or below -2.5 SD RECOMMENDATIONS: 1. All patients should optimize calcium and vitamin D intake. 2. Consider FDA-approved medical therapies in postmenopausal women and men aged 19 years and older, based on the following: a. A hip or vertebral(clinical or morphometric) fracture b. T-score < -2.5 at the femoral neck or spine after appropriate evaluation to  exclude secondary causes c. Low bone mass (T-score between -1.0 and -2.5 at the femoral neck or spine) and a 10-year probability of a hip fracture > 3% or a 10-year probability of a major osteoporosis-related fracture > 20% based on the [place]-adapted WHO algorithm d. Clinician judgment and/or patient preferences may indicate treatment for people with 10-year fracture probabilities above or below these levels FOLLOW-UP: Patients with diagnosis of osteoporosis or at high risk for fracture should have regular bone mineral density tests. For patients eligible for Medicare, routine testing is allowed once every 2 years. The testing frequency can be increased to one year for patients who have rapidly progressing disease, those who are receiving or discontinuing medical therapy to restore bone mass, or have additional risk factors. I have reviewed this report, and agree with the above findings. [facility] Radiology Technologist: MTB Your patient Ashlee Cox Cox a FRAX assessment on 03/18/2018 using the  [place] (analysis version: 14.10) manufactured by [employer]. The following summarizes the results of our evaluation. PATIENT BIOGRAPHICAL: Name: Ashlee Cox, Ashlee Cox Patient ID: [id number] Birth Date: April 22, 1950 Height:    65.7 in. Gender:     Female    [age]       Weight:    185.0 lbs. Ethnicity:  White                            Exam Date: 03/18/2018 FRAX* RESULTS:  (version: 3.5) 10-year Probability of Fracture1 Major Osteoporotic Fracture2 Hip Fracture 7.3% 0.4% Population: [place] (Caucasian) Risk Factors: None Based on Femur (Left) Neck BMD 1 -The 10-year probability of fracture may be lower than reported if the patient has received treatment. 2 -Major Osteoporotic Fracture: Clinical Spine, Forearm, Hip or Shoulder *FRAX is a Materials engineer of the [employer] for Metabolic Bone Disease, a [place] (WHO) [facility]. ASSESSMENT: The probability of a major osteoporotic fracture is 7.3% within the next ten years. The probability of a hip fracture is 0.4% within the next ten years. . Electronically Signed   By: Lowella Grip III M.D.   On: 03/18/2018 09:23    Assessment & Plan:   Problem List Items Addressed This Visit    None      I am having Ashlee Cox maintain her multivitamin, docusate sodium, lovastatin, metFORMIN, APPLE CIDER VINEGAR PO, hydrochlorothiazide, omeprazole, and losartan.  No orders of the defined types were placed in this encounter.   There are no discontinued medications.  Follow-up: No follow-ups on file.   [name], MD

## 2019-09-10 NOTE — Patient Instructions (Addendum)
Diagnostic mammogram and ultrasound have been ordered    For your diabetes:  Let's continue to check blood sugars once daily at variable times and repeat A1c in September     For blood pressure:  I am combining the losartan and hctz into one pill in late January when you are due for refill  Check BP once a week.  Notify me if 3 readings are > 130/80

## 2019-09-11 DIAGNOSIS — N6310 Unspecified lump in the right breast, unspecified quadrant: Secondary | ICD-10-CM | POA: Insufficient documentation

## 2019-09-11 NOTE — Assessment & Plan Note (Signed)
She has a tender palpable mass in the upper outer quadrant of the right breast.  Diagnostic U/s ordered . Last mammogram was 2019

## 2019-09-11 NOTE — Assessment & Plan Note (Signed)
Tolerating lovastatin.  LDL is at goal. Recommending exercise and weight loss  for reduction in triglycerides  Lab Results  Component Value Date   CHOL 173 04/24/2019   HDL 51.10 04/24/2019   LDLCALC 88 04/24/2019   LDLDIRECT 82.0 06/13/2018   TRIG 166.0 (H) 04/24/2019   CHOLHDL 3 04/24/2019

## 2019-09-11 NOTE — Assessment & Plan Note (Signed)
Recommend low GI diet using Weight Watcher.s  Continue metformin, lovastatin .  Repeat a1c in 3 months  Lab Results  Component Value Date   HGBA1C 6.9 (H) 08/26/2019   Lab Results  Component Value Date   MICROALBUR 0.9 04/24/2019   MICROALBUR <0.7 12/13/2017

## 2019-09-15 ENCOUNTER — Other Ambulatory Visit: Payer: Self-pay | Admitting: Internal Medicine

## 2019-09-15 DIAGNOSIS — N6310 Unspecified lump in the right breast, unspecified quadrant: Secondary | ICD-10-CM

## 2019-09-18 ENCOUNTER — Ambulatory Visit
Admission: RE | Admit: 2019-09-18 | Discharge: 2019-09-18 | Disposition: A | Discharge status: Home / Self Care | Payer: PPO | Source: Ambulatory Visit | Attending: Internal Medicine | Admitting: Internal Medicine

## 2019-09-18 ENCOUNTER — Other Ambulatory Visit: Payer: Self-pay | Admitting: Internal Medicine

## 2019-09-18 DIAGNOSIS — N6310 Unspecified lump in the right breast, unspecified quadrant: Secondary | ICD-10-CM

## 2019-09-18 DIAGNOSIS — R928 Other abnormal and inconclusive findings on diagnostic imaging of breast: Secondary | ICD-10-CM

## 2019-09-18 DIAGNOSIS — N6311 Unspecified lump in the right breast, upper outer quadrant: Secondary | ICD-10-CM | POA: Insufficient documentation

## 2019-09-18 DIAGNOSIS — R922 Inconclusive mammogram: Secondary | ICD-10-CM | POA: Diagnosis not present

## 2019-09-19 ENCOUNTER — Telehealth: Payer: Self-pay | Admitting: Internal Medicine

## 2019-09-19 NOTE — Telephone Encounter (Signed)
Ms Benjamine Mola has an appointment with Dr Bary Castilla for Monday afternoon for a breast biopsy .  If you could just process the referral, that would be great

## 2019-09-22 ENCOUNTER — Other Ambulatory Visit: Payer: Self-pay | Admitting: General Surgery

## 2019-09-22 DIAGNOSIS — N6310 Unspecified lump in the right breast, unspecified quadrant: Secondary | ICD-10-CM | POA: Diagnosis not present

## 2019-09-22 DIAGNOSIS — C50411 Malignant neoplasm of upper-outer quadrant of right female breast: Secondary | ICD-10-CM

## 2019-09-22 DIAGNOSIS — R928 Other abnormal and inconclusive findings on diagnostic imaging of breast: Secondary | ICD-10-CM | POA: Diagnosis not present

## 2019-09-22 DIAGNOSIS — R599 Enlarged lymph nodes, unspecified: Secondary | ICD-10-CM | POA: Diagnosis not present

## 2019-09-22 HISTORY — PX: BREAST BIOPSY: SHX20

## 2019-09-24 ENCOUNTER — Other Ambulatory Visit: Payer: Self-pay

## 2019-09-24 ENCOUNTER — Encounter: Payer: Self-pay | Admitting: *Deleted

## 2019-09-24 NOTE — Progress Notes (Signed)
Called patient today to establish navigation services.  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  ER/PR/Her2 are still pending.  Patient is scheduled to see Dr. Rogue Bussing tomorrow at 11:30.  Will give educational material to her at that time.  Patient does have a family history of ovarian cancer in mom at age 67, and melanoma in her sister.  Mentioned possibility of genetic testing.  Patient is to call with any questions or needs.

## 2019-09-24 NOTE — Progress Notes (Signed)
Contacted new patient prior to her NP apt with Dr. Rogue Bussing. Oncology services introduced to patient. Medications and assessment questions reviewed prior to apt.

## 2019-09-25 ENCOUNTER — Encounter: Payer: Self-pay | Admitting: Internal Medicine

## 2019-09-25 ENCOUNTER — Other Ambulatory Visit: Payer: Self-pay | Admitting: General Surgery

## 2019-09-25 ENCOUNTER — Inpatient Hospital Stay: Payer: PPO

## 2019-09-25 ENCOUNTER — Inpatient Hospital Stay: Payer: PPO | Attending: Internal Medicine | Admitting: Internal Medicine

## 2019-09-25 VITALS — BP 136/67 | HR 78 | Temp 99.1°F | Resp 16 | Ht 65.75 in | Wt 192.0 lb

## 2019-09-25 DIAGNOSIS — C50211 Malignant neoplasm of upper-inner quadrant of right female breast: Secondary | ICD-10-CM | POA: Insufficient documentation

## 2019-09-25 DIAGNOSIS — D4861 Neoplasm of uncertain behavior of right breast: Secondary | ICD-10-CM | POA: Diagnosis not present

## 2019-09-25 DIAGNOSIS — Z006 Encounter for examination for normal comparison and control in clinical research program: Secondary | ICD-10-CM | POA: Diagnosis present

## 2019-09-25 DIAGNOSIS — Z87891 Personal history of nicotine dependence: Secondary | ICD-10-CM | POA: Insufficient documentation

## 2019-09-25 DIAGNOSIS — Z5111 Encounter for antineoplastic chemotherapy: Secondary | ICD-10-CM

## 2019-09-25 DIAGNOSIS — Z808 Family history of malignant neoplasm of other organs or systems: Secondary | ICD-10-CM | POA: Diagnosis not present

## 2019-09-25 DIAGNOSIS — Z8041 Family history of malignant neoplasm of ovary: Secondary | ICD-10-CM | POA: Insufficient documentation

## 2019-09-25 DIAGNOSIS — Z171 Estrogen receptor negative status [ER-]: Secondary | ICD-10-CM | POA: Insufficient documentation

## 2019-09-25 DIAGNOSIS — Z9221 Personal history of antineoplastic chemotherapy: Secondary | ICD-10-CM

## 2019-09-25 HISTORY — DX: Personal history of antineoplastic chemotherapy: Z92.21

## 2019-09-25 LAB — COMPREHENSIVE METABOLIC PANEL
ALT: 42 U/L (ref 0–44)
AST: 36 U/L (ref 15–41)
Albumin: 4.4 g/dL (ref 3.5–5.0)
Alkaline Phosphatase: 79 U/L (ref 38–126)
Anion gap: 12 (ref 5–15)
BUN: 15 mg/dL (ref 8–23)
CO2: 28 mmol/L (ref 22–32)
Calcium: 9.8 mg/dL (ref 8.9–10.3)
Chloride: 102 mmol/L (ref 98–111)
Creatinine, Ser: 0.94 mg/dL (ref 0.44–1.00)
GFR calc Af Amer: >60 mL/min (ref >60)
GFR calc non Af Amer: >60 mL/min (ref >60)
Glucose, Bld: 127 mg/dL — ABNORMAL HIGH (ref 70–99)
Potassium: 4.4 mmol/L (ref 3.5–5.1)
Sodium: 142 mmol/L (ref 135–145)
Total Bilirubin: 0.8 mg/dL (ref 0.3–1.2)
Total Protein: 8.2 g/dL — ABNORMAL HIGH (ref 6.5–8.1)

## 2019-09-25 LAB — CBC
HCT: 42 % (ref 36.0–46.0)
Hemoglobin: 14.1 g/dL (ref 12.0–15.0)
MCH: 28.4 pg (ref 26.0–34.0)
MCHC: 33.6 g/dL (ref 30.0–36.0)
MCV: 84.5 fL (ref 80.0–100.0)
Platelets: 386 10*3/uL (ref 150–400)
RBC: 4.97 MIL/uL (ref 3.87–5.11)
RDW: 13.1 % (ref 11.5–15.5)
WBC: 7.6 10*3/uL (ref 4.0–10.5)
nRBC: 0 % (ref 0.0–0.2)

## 2019-09-25 NOTE — Assessment & Plan Note (Deleted)
#   Right breast cancer-T1N1-breast cancer profile pending.   # I had a long discussion with the patient in general regarding the treatment options of breast cancer including-surgery; adjuvant radiation; role of adjuvant systemic therapy including-chemotherapy antihormone therapy.  #Given the node involvement-I would recommend neoadjuvant chemotherapy.  The neoadjuvant chemotherapy-is undecided at this time given unavailability of breast cancer profile.  # Discussed the potential side effects including but not limited to-increasing fatigue, nausea vomiting, diarrhea, hair loss, sores in the mouth, increase risk of infection and also neuropathy.  Also given the cardiotoxic nature of chemotherapy would recommend MUGA scan.  I would also recommend port placement.  Discussed with Dr. Tollie Pizza.  # # Genetic counseling-   Patient meets the NCCN criteria for genetic testing  [mother with ovarian cancer Sister with melanoma]. Discussed the potential implications of a positive test.  Will refer to genetics.  #Clinical trial: We will refer to clinical trials-further evaluation for clinical trial options.  # Thank you Dr.Byrnett for allowing me to participate in the care of your pleasant patient. Please do not hesitate to contact me with questions or concerns in the interim.  Discussed with breast navigator Sheena.  For now will await breast cancer profile for final/specific recommendations regarding chemotherapy.  # DISPOSITION: # PET scan ASAP # MUGA scan ASAP # labs today- cbc/cmp/ca 27-29;ca-15-3; CEA- please order # chemotherapy education- Taxol based [chemo regimen not decided yet] # genetics counseling Dx- breast cancer # follow up in week of July 12th- MD; labs- cbc/cmp;?? Chemo-Dr.B

## 2019-09-25 NOTE — Progress Notes (Signed)
[name], [name]  CHIEF COMPLAINTS/PURPOSE OF CONSULTATION: Breast cancer  #  Oncology History   No history exists.     HISTORY OF PRESENTING ILLNESS:  Ashlee Cox 69 y.o.  female female with no prior history of breast cancer/or malignancies has been referred to [place] for further evaluation recommendations for new diagnosis of breast cancer.   Patient noted to have a pain/lump in the right breast that led to further evaluation with mammogram/ultrasound/followed by biopsy.  Approximately 2.9 cm mass noted in the right upper outer quadrant.  Biopsy-positive for mammary carcinoma; ER/PR/HER-2/neu status pending.  Also positive for metastatic right axillary lymph node.  Patient met with Dr. Tollie Pizza for surgical evaluation.    Given the presence of a metastatic positive lymph node right axilla-for consideration of neoadjuvant chemotherapy.  Family history of breast cancer: None Family history of other cancers: Mother ovarian cancer; sister melanoma   Review of Systems  Constitutional: Negative for chills, diaphoresis, fever, malaise/fatigue and weight loss.  HENT: Negative for nosebleeds and sore throat.   Eyes: Negative for double vision.  Respiratory: Negative for cough, hemoptysis, sputum production, shortness of breath and wheezing.   Cardiovascular: Negative for chest pain, palpitations, orthopnea and leg swelling.  Gastrointestinal: Negative for abdominal pain, blood in stool, constipation, diarrhea, heartburn, melena, nausea and vomiting.  Genitourinary: Negative for dysuria, frequency and urgency.  Musculoskeletal: Negative for back pain and joint pain.  Skin: Negative.  Negative for itching and rash.  Neurological: Negative for dizziness, tingling, focal weakness, weakness and headaches.  Endo/Heme/Allergies: Does not bruise/bleed easily.  Psychiatric/Behavioral: Negative for  depression. The patient is not nervous/anxious and does not have insomnia.      MEDICAL HISTORY:  Past Medical History:  Diagnosis Date  . Cyst, breast    benign  . H/O: rheumatic fever   . 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 Hyperlipidemia   . Hypertension   . Menopause    at age 9    SURGICAL HISTORY: Past Surgical History:  Procedure Laterality Date  . APPENDECTOMY  2006  . BREAST CYST ASPIRATION Left 1999  . CHOLECYSTECTOMY  2006  . VAGINAL DELIVERY     x3    SOCIAL HISTORY: Social History   Socioeconomic History  . Marital status: Widowed    Spouse name: Not on file  . Number of children: Not on file  . Years of education: Not on file  . Highest education level: Not on file  Occupational History  . Occupation: Glass blower/designer: [employer]  Tobacco Use  . Smoking status: Former Smoker    Years: 1.00    Types: Cigarettes    Quit date: 12/26/2005    Years since quitting: 13.7  . Smokeless tobacco: Never Used  . Tobacco comment: smoked for less than 1 years,  1 cig/day  Substance and Sexual Activity  . Alcohol use: No  . Drug use: No  . Sexual activity: Never    Partners: Female  Other Topics Concern  . Not on file  Social History Narrative   Widowed, March 2014; lives with fiance.      [employer] ret[employer]; quit smoking 1ppw; no alcohol.       Social Determinants of Health   Financial Resource Strain:   . Difficulty of Paying Living Expenses:  Food Insecurity:   . Worried About Charity fundraiser in the Last Year:   . Arboriculturist in the Last Year:   Transportation Needs:   . Film/video editor (Medical):   Marland Kitchen Lack of Transportation (Non-Medical):   Physical Activity:   . Days of Exercise per Week:   . Minutes of Exercise per Session:   Stress:   . Feeling of Stress :   Social Connections:   . Frequency of Communication with Friends and Family:   . Frequency of Social Gatherings  with Friends and Family:   . Attends Religious Services:   . Active Member of Clubs or Organizations:   . Attends Archivist Meetings:   Marland Kitchen Marital Status:   Intimate Partner Violence:   . Fear of Current or Ex-Partner:   . Emotionally Abused:   Marland Kitchen Physically Abused:   . Sexually Abused:     FAMILY HISTORY: Family History  Problem Relation Age of Onset  . Cancer Mother 1       ovarian- died 4-5 years.   . Heart disease Mother 44  . Diabetes Mother   . Stroke Father 63  . Diabetes Father   . Diabetes Sister   . Melanoma Sister        survived  . Breast cancer Neg Hx     ALLERGIES:  has No Known Allergies.  MEDICATIONS:  Current Outpatient Medications  Medication Sig Dispense Refill  . APPLE CIDER VINEGAR PO Take 450 mg by mouth daily.    Marland Kitchen docusate sodium (COLACE) 100 MG capsule Take 100 mg by mouth daily.    Derrill Memo ON 10/17/2019] losartan-hydrochlorothiazide (HYZAAR) 50-12.5 MG tablet Take 1 tablet by mouth daily. 90 tablet 3  . lovastatin (MEVACOR) 40 MG tablet TAKE 1 TABLET BY MOUTH AT BEDTIME (Patient taking differently: Take 40 mg by mouth in the morning. ) 90 tablet 3  . metFORMIN (GLUCOPHAGE) 500 MG tablet Take 1 tablet (500 mg total) by mouth 2 (two) times daily with a meal. 180 tablet 3  . Multiple Vitamin (MULTIVITAMIN) tablet Take 1 tablet by mouth daily.      Marland Kitchen omeprazole (PRILOSEC) 20 MG capsule Take 1 capsule by mouth once daily (Patient taking differently: Take 20 mg by mouth daily. ) 90 capsule 0  . hydrochlorothiazide (MICROZIDE) 12.5 MG capsule Take 12.5 mg by mouth daily.    Marland Kitchen losartan (COZAAR) 50 MG tablet Take 50 mg by mouth daily.     No current facility-administered medications for this visit.      Marland Kitchen  PHYSICAL EXAMINATION: ECOG PERFORMANCE STATUS: 0 - Asymptomatic  Vitals:   09/25/19 1126  BP: 136/67  Pulse: 78  Resp: 16  Temp: 99.1 F (37.3 C)  SpO2: 94%   Filed Weights   09/25/19 1126  Weight: 192 lb (87.1 kg)     Physical Exam HENT:     Head: Normocephalic and atraumatic.     Mouth/Throat:     Pharynx: No oropharyngeal exudate.  Eyes:     Pupils: Pupils are equal, round, and reactive to light.  Cardiovascular:     Rate and Rhythm: Normal rate and regular rhythm.  Pulmonary:     Effort: No respiratory distress.     Breath sounds: No wheezing.  Abdominal:     General: Bowel sounds are normal. There is no distension.     Palpations: Abdomen is soft. There is no mass.     Tenderness: There is no abdominal  tenderness. There is no guarding or rebound.  Musculoskeletal:        General: No tenderness. Normal range of motion.     Cervical back: Normal range of motion and neck supple.  Skin:    General: Skin is warm.     Comments: Right and left BREAST exam (in the presence of nurse)-right upper outer quadrant approximately 3 cm mobile mass felt.  No unusual skin changes.  No axillary lymph nodes felt.   Left breast-no dominant masses no skin changes.    Neurological:     Mental Status: She is alert and oriented to person, place, and time.  Psychiatric:        Mood and Affect: Affect normal.      LABORATORY DATA:  I have reviewed the data as listed Lab Results  Component Value Date   WBC 7.6 09/25/2019   HGB 14.1 09/25/2019   HCT 42.0 09/25/2019   MCV 84.5 09/25/2019   PLT 386 09/25/2019   Recent Labs    04/24/19 0917 08/26/19 0951 09/25/19 1215  NA 139 139 142  K 4.1 3.8 4.4  CL 102 101 102  CO2 28 27 28   GLUCOSE 174* 130* 127*  BUN 16 16 15   CREATININE 0.86 0.77 0.94  CALCIUM 9.8 9.8 9.8  GFRNONAA  --   --  >60  GFRAA  --   --  >60  PROT 7.4 7.5 8.2*  ALBUMIN 4.2 4.4 4.4  AST 42* 31 36  ALT 46* 39* 42  ALKPHOS 80 74 79  BILITOT 0.7 0.6 0.8    RADIOGRAPHIC STUDIES: I have personally reviewed the radiological images as listed and agreed with the findings in the report. US BREAST LTD UNI RIGHT INC AXILLA  Result Date: 09/18/2019 CLINICAL DATA:  For the past 3  weeks, the patient has felt a mass in the upper-outer quadrant of the right breast as well as shooting pain extending from the axilla to the nipple on the right. EXAM: DIGITAL DIAGNOSTIC BILATERAL MAMMOGRAM WITH CAD AND TOMO ULTRASOUND RIGHT BREAST COMPARISON:  Previous exam(s). ACR Breast Density Category c: The breast tissue is heterogeneously dense, which may obscure small masses. FINDINGS: There is an irregular mass ill-defined margins in the posterior aspect of the upper-outer quadrant of the right breast, corresponding to the mass felt by the patient, marked with a metallic marker. There is also a single right axillary lymph node which is slightly larger and denser than the other nodes. No findings elsewhere in either breast suspicious for malignancy. Mammographic images were processed with CAD. On physical exam, the patient has an approximately 3 cm oval, firm, palpable mass in the 10:30 o'clock position of the right breast, 8 cm from the nipple. There are no palpable right axillary lymph nodes. Targeted ultrasound is performed, showing an irregular hypoechoic mass with ill-defined surrounding increased echogenicity in the 10:30 o'clock position of the right breast, 8 cm from the nipple. This measures approximately 2.9 x 2.5 x 2.3 cm in maximum dimensions. Ultrasound of the right axilla demonstrates a single right axillary lymph node with focal eccentric cortical thickening measuring 5.4 mm in maximum thickness. IMPRESSION: 1. 2.9 cm palpable mass in the 10:30 o'clock position of the right breast with imaging features highly suspicious for malignancy. 2. Single right axillary lymph node with focal eccentric cortical thickening suspicious for a metastatic lymph node. RECOMMENDATION: Ultrasound-guided core needle biopsy of the 2.9 cm mass in the 10:30 o'clock position of the right breast and ultrasound-guided core  needle biopsy of the abnormal right axillary lymph node. This has been discussed with the patient  and will be scheduled in consultation with her referring provider. I have discussed the findings and recommendations with the patient. If applicable, a reminder letter will be sent to the patient regarding the next appointment. BI-RADS CATEGORY  5: Highly suggestive of malignancy. Electronically Signed   By: Claudie Revering M.D.   On: 09/18/2019 11:15   MM DIAG BREAST TOMO BILATERAL  Result Date: 09/18/2019 CLINICAL DATA:  For the past 3 weeks, the patient has felt a mass in the upper-outer quadrant of the right breast as well as shooting pain extending from the axilla to the nipple on the right. EXAM: DIGITAL DIAGNOSTIC BILATERAL MAMMOGRAM WITH CAD AND TOMO ULTRASOUND RIGHT BREAST COMPARISON:  Previous exam(s). ACR Breast Density Category c: The breast tissue is heterogeneously dense, which may obscure small masses. FINDINGS: There is an irregular mass ill-defined margins in the posterior aspect of the upper-outer quadrant of the right breast, corresponding to the mass felt by the patient, marked with a metallic marker. There is also a single right axillary lymph node which is slightly larger and denser than the other nodes. No findings elsewhere in either breast suspicious for malignancy. Mammographic images were processed with CAD. On physical exam, the patient has an approximately 3 cm oval, firm, palpable mass in the 10:30 o'clock position of the right breast, 8 cm from the nipple. There are no palpable right axillary lymph nodes. Targeted ultrasound is performed, showing an irregular hypoechoic mass with ill-defined surrounding increased echogenicity in the 10:30 o'clock position of the right breast, 8 cm from the nipple. This measures approximately 2.9 x 2.5 x 2.3 cm in maximum dimensions. Ultrasound of the right axilla demonstrates a single right axillary lymph node with focal eccentric cortical thickening measuring 5.4 mm in maximum thickness. IMPRESSION: 1. 2.9 cm palpable mass in the 10:30 o'clock  position of the right breast with imaging features highly suspicious for malignancy. 2. Single right axillary lymph node with focal eccentric cortical thickening suspicious for a metastatic lymph node. RECOMMENDATION: Ultrasound-guided core needle biopsy of the 2.9 cm mass in the 10:30 o'clock position of the right breast and ultrasound-guided core needle biopsy of the abnormal right axillary lymph node. This has been discussed with the patient and will be scheduled in consultation with her referring provider. I have discussed the findings and recommendations with the patient. If applicable, a reminder letter will be sent to the patient regarding the next appointment. BI-RADS CATEGORY  5: Highly suggestive of malignancy. Electronically Signed   By: Claudie Revering M.D.   On: 09/18/2019 11:15    ASSESSMENT & PLAN:   Neoplasm of uncertain behavior of upper outer quadrant of female breast, right # Right breast cancer-T1N1-breast cancer profile pending.   #Discussed the goal of treatment in general would be cure-given the early stage cancer.  Would recommend a PET scan; tumor markers.   # I had a long discussion with the patient in general regarding the treatment options of breast cancer including-surgery; adjuvant radiation; role of adjuvant systemic therapy including-chemotherapy antihormone therapy.  #Given the node involvement-I would recommend neoadjuvant chemotherapy.  The neoadjuvant chemotherapy-is undecided at this time given unavailability of breast cancer profile.  # Discussed the potential side effects including but not limited to-increasing fatigue, nausea vomiting, diarrhea, hair loss, sores in the mouth, increase risk of infection and also neuropathy.  Also given the cardiotoxic nature of chemotherapy would recommend MUGA  scan.  I would also recommend port placement.  Discussed with Dr. Tollie Pizza.  # # Genetic counseling-   Patient meets the NCCN criteria for genetic testing  [mother with  ovarian cancer Sister with melanoma]. Discussed the potential implications of a positive test.  Will refer to genetics.  #Clinical trial: We will refer to clinical trials-further evaluation for clinical trial options.  # Thank you Dr.Byrnett for allowing me to participate in the care of your pleasant patient. Please do not hesitate to contact me with questions or concerns in the interim.  Discussed with breast navigator Sheena.  For now will await breast cancer profile for final/specific recommendations regarding chemotherapy.  # DISPOSITION: # PET scan ASAP # MUGA scan ASAP # labs today- cbc/cmp/ca 27-29;ca-15-3; CEA- please order # chemotherapy education- Taxol based [chemo regimen not decided yet] # genetics counseling Dx- breast cancer # follow up in week of July 12th- MD; labs- cbc/cmp;?? Chemo-Dr.B  All questions were answered. The patient/family knows to call the clinic with any problems, questions or concerns.   Cammie Sickle, MD 09/28/2019 8:52 AM

## 2019-09-26 LAB — CANCER ANTIGEN 15-3: CA 15-3: 10.1 U/mL (ref 0.0–25.0)

## 2019-09-26 LAB — CA 27.29 (SERIAL MONITOR): CA 27.29: 18.8 U/mL (ref 0.0–38.6)

## 2019-09-26 LAB — CEA: CEA: 2.1 ng/mL (ref 0.0–4.7)

## 2019-09-26 NOTE — Patient Instructions (Signed)
Paclitaxel injection What is this medicine? PACLITAXEL (PAK li TAX el) is a chemotherapy drug. It targets fast dividing cells, like cancer cells, and causes these cells to die. This medicine is used to treat ovarian cancer, breast cancer, lung cancer, Kaposi's sarcoma, and other cancers. This medicine may be used for other purposes; ask your health care provider or pharmacist if you have questions. COMMON BRAND NAME(S): Onxol, Taxol What should I tell my health care provider before I take this medicine? They need to know if you have any of these conditions:  history of irregular heartbeat  liver disease  low blood counts, like low white cell, platelet, or red cell counts  lung or breathing disease, like asthma  tingling of the fingers or toes, or other nerve disorder  an unusual or allergic reaction to paclitaxel, alcohol, polyoxyethylated castor oil, other chemotherapy, other medicines, foods, dyes, or preservatives  pregnant or trying to get pregnant  breast-feeding How should I use this medicine? This drug is given as an infusion into a vein. It is administered in a hospital or clinic by a specially trained health care professional. Talk to your pediatrician regarding the use of this medicine in children. Special care may be needed. Overdosage: If you think you have taken too much of this medicine contact a poison control center or emergency room at once. NOTE: This medicine is only for you. Do not share this medicine with others. What if I miss a dose? It is important not to miss your dose. Call your doctor or health care professional if you are unable to keep an appointment. What may interact with this medicine? Do not take this medicine with any of the following medications:  disulfiram  metronidazole This medicine may also interact with the following medications:  antiviral medicines for hepatitis, HIV or AIDS  certain antibiotics like erythromycin and  clarithromycin  certain medicines for fungal infections like ketoconazole and itraconazole  certain medicines for seizures like carbamazepine, phenobarbital, phenytoin  gemfibrozil  nefazodone  rifampin  St. John's wort This list may not describe all possible interactions. Give your health care provider a list of all the medicines, herbs, non-prescription drugs, or dietary supplements you use. Also tell them if you smoke, drink alcohol, or use illegal drugs. Some items may interact with your medicine. What should I watch for while using this medicine? Your condition will be monitored carefully while you are receiving this medicine. You will need important blood work done while you are taking this medicine. This medicine can cause serious allergic reactions. To reduce your risk you will need to take other medicine(s) before treatment with this medicine. If you experience allergic reactions like skin rash, itching or hives, swelling of the face, lips, or tongue, tell your doctor or health care professional right away. In some cases, you may be given additional medicines to help with side effects. Follow all directions for their use. This drug may make you feel generally unwell. This is not uncommon, as chemotherapy can affect healthy cells as well as cancer cells. Report any side effects. Continue your course of treatment even though you feel ill unless your doctor tells you to stop. Call your doctor or health care professional for advice if you get a fever, chills or sore throat, or other symptoms of a cold or flu. Do not treat yourself. This drug decreases your body's ability to fight infections. Try to avoid being around people who are sick. This medicine may increase your risk to bruise   or bleed. Call your doctor or health care professional if you notice any unusual bleeding. Be careful brushing and flossing your teeth or using a toothpick because you may get an infection or bleed more easily.  If you have any dental work done, tell your dentist you are receiving this medicine. Avoid taking products that contain aspirin, acetaminophen, ibuprofen, naproxen, or ketoprofen unless instructed by your doctor. These medicines may hide a fever. Do not become pregnant while taking this medicine. Women should inform their doctor if they wish to become pregnant or think they might be pregnant. There is a potential for serious side effects to an unborn child. Talk to your health care professional or pharmacist for more information. Do not breast-feed an infant while taking this medicine. Men are advised not to father a child while receiving this medicine. This product may contain alcohol. Ask your pharmacist or healthcare provider if this medicine contains alcohol. Be sure to tell all healthcare providers you are taking this medicine. Certain medicines, like metronidazole and disulfiram, can cause an unpleasant reaction when taken with alcohol. The reaction includes flushing, headache, nausea, vomiting, sweating, and increased thirst. The reaction can last from 30 minutes to several hours. What side effects may I notice from receiving this medicine? Side effects that you should report to your doctor or health care professional as soon as possible:  allergic reactions like skin rash, itching or hives, swelling of the face, lips, or tongue  breathing problems  changes in vision  fast, irregular heartbeat  high or low blood pressure  mouth sores  pain, tingling, numbness in the hands or feet  signs of decreased platelets or bleeding - bruising, pinpoint red spots on the skin, black, tarry stools, blood in the urine  signs of decreased red blood cells - unusually weak or tired, feeling faint or lightheaded, falls  signs of infection - fever or chills, cough, sore throat, pain or difficulty passing urine  signs and symptoms of liver injury like dark yellow or brown urine; general ill feeling or  flu-like symptoms; light-colored stools; loss of appetite; nausea; right upper belly pain; unusually weak or tired; yellowing of the eyes or skin  swelling of the ankles, feet, hands  unusually slow heartbeat Side effects that usually do not require medical attention (report to your doctor or health care professional if they continue or are bothersome):  diarrhea  hair loss  loss of appetite  muscle or joint pain  nausea, vomiting  pain, redness, or irritation at site where injected  tiredness This list may not describe all possible side effects. Call your doctor for medical advice about side effects. You may report side effects to FDA at 1-800-FDA-1088. Where should I keep my medicine? This drug is given in a hospital or clinic and will not be stored at home. NOTE: This sheet is a summary. It may not cover all possible information. If you have questions about this medicine, talk to your doctor, pharmacist, or health care provider.  2020 Elsevier/Gold Standard (2016-11-14 13:14:55)  

## 2019-09-28 ENCOUNTER — Other Ambulatory Visit: Payer: Self-pay | Admitting: Internal Medicine

## 2019-09-28 MED ORDER — PROCHLORPERAZINE MALEATE 10 MG PO TABS
10.0000 mg | ORAL_TABLET | Freq: Four times a day (QID) | ORAL | 1 refills | Status: DC | PRN
Start: 2019-09-28 — End: 2020-04-15

## 2019-09-28 MED ORDER — LIDOCAINE-PRILOCAINE 2.5-2.5 % EX CREA
1.0000 | TOPICAL_CREAM | CUTANEOUS | 0 refills | Status: DC | PRN
Start: 2019-09-28 — End: 2019-10-06

## 2019-09-28 MED ORDER — ONDANSETRON HCL 8 MG PO TABS
ORAL_TABLET | ORAL | 1 refills | Status: DC
Start: 2019-09-28 — End: 2020-04-15

## 2019-09-28 NOTE — Assessment & Plan Note (Addendum)
#   Right breast cancer-T1N1-breast cancer profile pending.   #Discussed the goal of treatment in general would be cure-given the early stage cancer.  Would recommend a PET scan; tumor markers.   # I had a long discussion with the patient in general regarding the treatment options of breast cancer including-surgery; adjuvant radiation; role of adjuvant systemic therapy including-chemotherapy antihormone therapy.  #Given the node involvement-I would recommend neoadjuvant chemotherapy.  The neoadjuvant chemotherapy-is undecided at this time given unavailability of breast cancer profile.  # Discussed the potential side effects including but not limited to-increasing fatigue, nausea vomiting, diarrhea, hair loss, sores in the mouth, increase risk of infection and also neuropathy.  Also given the cardiotoxic nature of chemotherapy would recommend MUGA scan.  I would also recommend port placement.  Discussed with Dr. Tollie Pizza.  # # Genetic counseling-   Patient meets the NCCN criteria for genetic testing  [mother with ovarian cancer Sister with melanoma]. Discussed the potential implications of a positive test.  Will refer to genetics.  #Clinical trial: We will refer to clinical trials-further evaluation for clinical trial options.  # Thank you Dr.Byrnett for allowing me to participate in the care of your pleasant patient. Please do not hesitate to contact me with questions or concerns in the interim.  Discussed with breast navigator Sheena.  For now will await breast cancer profile for final/specific recommendations regarding chemotherapy.  # DISPOSITION: # PET scan ASAP # MUGA scan ASAP # labs today- cbc/cmp/ca 27-29;ca-15-3; [facility]A- please order # chemotherapy education- Taxol based [chemo regimen not decided yet] # genetics counseling Dx- breast cancer # follow up in week of July 12th- MD; labs- cbc/cmp;?? Chemo-Dr.B

## 2019-09-28 NOTE — Progress Notes (Signed)
Anti-emetics/emla cream sent to pharmacy.

## 2019-09-30 ENCOUNTER — Ambulatory Visit
Admission: RE | Admit: 2019-09-30 | Discharge: 2019-09-30 | Disposition: A | Discharge status: Home / Self Care | Payer: PPO | Source: Ambulatory Visit | Attending: Internal Medicine | Admitting: Internal Medicine

## 2019-09-30 ENCOUNTER — Inpatient Hospital Stay: Payer: PPO

## 2019-09-30 ENCOUNTER — Other Ambulatory Visit: Payer: Self-pay | Admitting: Internal Medicine

## 2019-09-30 ENCOUNTER — Other Ambulatory Visit: Payer: Self-pay

## 2019-09-30 ENCOUNTER — Inpatient Hospital Stay (HOSPITAL_BASED_OUTPATIENT_CLINIC_OR_DEPARTMENT_OTHER): Payer: PPO | Admitting: Oncology

## 2019-09-30 ENCOUNTER — Other Ambulatory Visit: Payer: Self-pay | Admitting: Pathology

## 2019-09-30 DIAGNOSIS — D4861 Neoplasm of uncertain behavior of right breast: Secondary | ICD-10-CM

## 2019-09-30 DIAGNOSIS — Z79899 Other long term (current) drug therapy: Secondary | ICD-10-CM | POA: Diagnosis not present

## 2019-09-30 DIAGNOSIS — E669 Obesity, unspecified: Secondary | ICD-10-CM | POA: Insufficient documentation

## 2019-09-30 DIAGNOSIS — C50911 Malignant neoplasm of unspecified site of right female breast: Secondary | ICD-10-CM | POA: Diagnosis not present

## 2019-09-30 DIAGNOSIS — Z01818 Encounter for other preprocedural examination: Secondary | ICD-10-CM | POA: Diagnosis not present

## 2019-09-30 DIAGNOSIS — Z5111 Encounter for antineoplastic chemotherapy: Secondary | ICD-10-CM | POA: Diagnosis not present

## 2019-09-30 DIAGNOSIS — I10 Essential (primary) hypertension: Secondary | ICD-10-CM | POA: Insufficient documentation

## 2019-09-30 MED ORDER — TECHNETIUM TC 99M-LABELED RED BLOOD CELLS IV KIT
20.0000 | PACK | Freq: Once | INTRAVENOUS | Status: AC | PRN
  Administered 2019-09-30: 24.07 via INTRAVENOUS

## 2019-09-30 NOTE — Progress Notes (Signed)
Ashlee Cox  Telephone:([phone] [phone]  Patient Care Team: [name], [name]   Name of the patient: Ashlee Cox  [id number]  22-Feb-1951   Date of visit: 09/30/19  Diagnosis- Breast Cancer   Chief complaint/Reason for visit- Initial Meeting for [facility], preparing for starting chemotherapy  Heme/Onc history:  Oncology History   No history exists.    Interval history-  Ashlee Cox is a 69 yo female who presents to chemo care clinic today for initial meeting in preparation for starting chemotherapy. I introduced the chemo care clinic and we discussed that the role of the clinic is to assist those who are at an increased risk of emergency room visits and/or complications during the course of chemotherapy treatment. We discussed that the increased risk takes into account factors such as age, performance status, and co-morbidities. We also discussed that for some, this might include barriers to care such as not having a primary care provider, lack of insurance/transportation, or not being able to afford medications. We discussed that the goal of the program is to help prevent unplanned ER visits and help reduce complications during chemotherapy. We do this by discussing specific risk factors to each individual and identifying ways that we can help improve these risk factors and reduce barriers to care.   ECOG FS:0 - Asymptomatic  Review of systems- Review of Systems  Constitutional: Negative.  Negative for chills, fever, malaise/fatigue and weight loss.  HENT: Negative for congestion, ear pain and tinnitus.   Eyes: Negative.  Negative for blurred vision and double vision.  Respiratory: Negative.  Negative for cough, sputum production and shortness of breath.   Cardiovascular: Negative.  Negative for chest pain, palpitations and leg swelling.  Gastrointestinal: Negative.   Negative for abdominal pain, constipation, diarrhea, nausea and vomiting.  Genitourinary: Negative for dysuria, frequency and urgency.  Musculoskeletal: Negative for back pain and falls.  Skin: Negative.  Negative for rash.  Neurological: Negative.  Negative for weakness and headaches.  Endo/Heme/Allergies: Negative.  Does not bruise/bleed easily.  Psychiatric/Behavioral: Negative.  Negative for depression. The patient is not nervous/anxious and does not have insomnia.      Current treatment- Taxol based chemo   No Known Allergies  Past Medical History:  Diagnosis Date  . Cyst, breast    benign  . H/O: rheumatic fever   . 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 Hyperlipidemia   . Hypertension   . Menopause    at age 37    Past Surgical History:  Procedure Laterality Date  . APPENDECTOMY  2006  . BREAST CYST ASPIRATION Left 1999  . CHOLECYSTECTOMY  2006  . VAGINAL DELIVERY     x3    Social History   Socioeconomic History  . Marital status: Widowed    Spouse name: Not on file  . Number of children: Not on file  . Years of education: Not on file  . Highest education level: Not on file  Occupational History  . Occupation: Glass blower/designer: [employer]  Tobacco Use  . Smoking status: Former Smoker    Years: 1.00    Types: Cigarettes    Quit date: 12/26/2005    Years since quitting: 13.7  . Smokeless tobacco: Never Used  . Tobacco comment: smoked for less than 1 years,  1 cig/day  Substance and Sexual Activity  . Alcohol use: No  . Drug use: No  . Sexual activity: Never    Partners: Female  Other Topics Concern  . Not on file  Social History Narrative   Widowed, March 2014; lives with fiance.      [employer] ret[employer]; quit smoking 1ppw; no alcohol.       Social Determinants of Health   Financial Resource Strain:   . Difficulty of Paying Living Expenses:   Food Insecurity:   . Worried About Sales executive in the Last Year:   . Arboriculturist in the Last Year:   Transportation Needs:   . Film/video editor (Medical):   Marland Kitchen Lack of Transportation (Non-Medical):   Physical Activity:   . Days of Exercise per Week:   . Minutes of Exercise per Session:   Stress:   . Feeling of Stress :   Social Connections:   . Frequency of Communication with Friends and Family:   . Frequency of Social Gatherings with Friends and Family:   . Attends Religious Services:   . Active Member of Clubs or Organizations:   . Attends Archivist Meetings:   Marland Kitchen Marital Status:   Intimate Partner Violence:   . Fear of Current or Ex-Partner:   . Emotionally Abused:   Marland Kitchen Physically Abused:   . Sexually Abused:     Family History  Problem Relation Age of Onset  . Cancer Mother 13       ovarian- died 4-5 years.   . Heart disease Mother 2  . Diabetes Mother   . Stroke Father 49  . Diabetes Father   . Diabetes Sister   . Melanoma Sister        survived  . Breast cancer Neg Hx      Current Outpatient Medications:  .  APPLE CIDER VINEGAR PO, Take 450 mg by mouth daily., Disp: , Rfl:  .  docusate sodium (COLACE) 100 MG capsule, Take 100 mg by mouth daily., Disp: , Rfl:  .  hydrochlorothiazide (MICROZIDE) 12.5 MG capsule, Take 12.5 mg by mouth daily., Disp: , Rfl:  .  lidocaine-prilocaine (EMLA) cream, Apply 1 application topically as needed., Disp: 30 g, Rfl: 0 .  losartan (COZAAR) 50 MG tablet, Take 50 mg by mouth daily., Disp: , Rfl:  .  [START ON 10/17/2019] losartan-hydrochlorothiazide (HYZAAR) 50-12.5 MG tablet, Take 1 tablet by mouth daily., Disp: 90 tablet, Rfl: 3 .  lovastatin (MEVACOR) 40 MG tablet, TAKE 1 TABLET BY MOUTH AT BEDTIME (Patient taking differently: Take 40 mg by mouth in the morning. ), Disp: 90 tablet, Rfl: 3 .  metFORMIN (GLUCOPHAGE) 500 MG tablet, Take 1 tablet (500 mg total) by mouth 2 (two) times daily with a meal., Disp: 180 tablet, Rfl: 3 .  Multiple Vitamin  (MULTIVITAMIN) tablet, Take 1 tablet by mouth daily.  , Disp: , Rfl:  .  omeprazole (PRILOSEC) 20 MG capsule, Take 1 capsule by mouth once daily (Patient taking differently: Take 20 mg by mouth daily. ), Disp: 90 capsule, Rfl: 0 .  ondansetron (ZOFRAN) 8 MG tablet, One pill every 8 hours as needed for nausea/vomitting., Disp: 40 tablet, Rfl: 1 .  prochlorperazine (COMPAZINE) 10 MG tablet, Take 1 tablet (10 mg total) by mouth every 6 (six) hours as needed for nausea or vomiting., Disp: 40 tablet, Rfl: 1  Physical exam: There were no vitals filed for this visit. Physical Exam Constitutional:  Appearance: Normal appearance.  HENT:     Head: Normocephalic and atraumatic.  Eyes:     Pupils: Pupils are equal, round, and reactive to light.  Cardiovascular:     Rate and Rhythm: Normal rate and regular rhythm.     Heart sounds: Normal heart sounds. No murmur heard.   Pulmonary:     Effort: Pulmonary effort is normal.     Breath sounds: Normal breath sounds. No wheezing.  Abdominal:     General: Bowel sounds are normal. There is no distension.     Palpations: Abdomen is soft.     Tenderness: There is no abdominal tenderness.  Musculoskeletal:        General: Normal range of motion.     Cervical back: Normal range of motion.  Skin:    General: Skin is warm and dry.     Findings: No rash.  Neurological:     Mental Status: She is alert and oriented to person, place, and time.  Psychiatric:        Judgment: Judgment normal.      CMP Latest Ref Rng & Units 09/25/2019  Glucose 70 - 99 mg/dL 127(H)  BUN 8 - 23 mg/dL 15  Creatinine 0.44 - 1.00 mg/dL 0.94  Sodium 135 - 145 mmol/L 142  Potassium 3.5 - 5.1 mmol/L 4.4  Chloride 98 - 111 mmol/L 102  CO2 22 - 32 mmol/L 28  Calcium 8.9 - 10.3 mg/dL 9.8  Total Protein 6.5 - 8.1 g/dL 8.2(H)  Total Bilirubin 0.3 - 1.2 mg/dL 0.8  Alkaline Phos 38 - 126 U/L 79  AST 15 - 41 U/L 36  ALT 0 - 44 U/L 42   CBC Latest Ref Rng & Units 09/25/2019  WBC  4.0 - 10.5 K/uL 7.6  Hemoglobin 12.0 - 15.0 g/dL 14.1  Hematocrit 36 - 46 % 42.0  Platelets 150 - 400 K/uL 386    No images are attached to the encounter.  US BREAST LTD UNI RIGHT INC AXILLA  Result Date: 09/18/2019 CLINICAL DATA:  For the past 3 weeks, the patient has felt a mass in the upper-outer quadrant of the right breast as well as shooting pain extending from the axilla to the nipple on the right. EXAM: DIGITAL DIAGNOSTIC BILATERAL MAMMOGRAM WITH CAD AND TOMO ULTRASOUND RIGHT BREAST COMPARISON:  Previous exam(s). ACR Breast Density Category c: The breast tissue is heterogeneously dense, which may obscure small masses. FINDINGS: There is an irregular mass ill-defined margins in the posterior aspect of the upper-outer quadrant of the right breast, corresponding to the mass felt by the patient, marked with a metallic marker. There is also a single right axillary lymph node which is slightly larger and denser than the other nodes. No findings elsewhere in either breast suspicious for malignancy. Mammographic images were processed with CAD. On physical exam, the patient has an approximately 3 cm oval, firm, palpable mass in the 10:30 o'clock position of the right breast, 8 cm from the nipple. There are no palpable right axillary lymph nodes. Targeted ultrasound is performed, showing an irregular hypoechoic mass with ill-defined surrounding increased echogenicity in the 10:30 o'clock position of the right breast, 8 cm from the nipple. This measures approximately 2.9 x 2.5 x 2.3 cm in maximum dimensions. Ultrasound of the right axilla demonstrates a single right axillary lymph node with focal eccentric cortical thickening measuring 5.4 mm in maximum thickness. IMPRESSION: 1. 2.9 cm palpable mass in the 10:30 o'clock position of the right breast with imaging  features highly suspicious for malignancy. 2. Single right axillary lymph node with focal eccentric cortical thickening suspicious for a metastatic  lymph node. RECOMMENDATION: Ultrasound-guided core needle biopsy of the 2.9 cm mass in the 10:30 o'clock position of the right breast and ultrasound-guided core needle biopsy of the abnormal right axillary lymph node. This has been discussed with the patient and will be scheduled in consultation with her referring provider. I have discussed the findings and recommendations with the patient. If applicable, a reminder letter will be sent to the patient regarding the next appointment. BI-RADS CATEGORY  5: Highly suggestive of malignancy. Electronically Signed   By: Claudie Revering M.D.   On: 09/18/2019 11:15   MM DIAG BREAST TOMO BILATERAL  Result Date: 09/18/2019 CLINICAL DATA:  For the past 3 weeks, the patient has felt a mass in the upper-outer quadrant of the right breast as well as shooting pain extending from the axilla to the nipple on the right. EXAM: DIGITAL DIAGNOSTIC BILATERAL MAMMOGRAM WITH CAD AND TOMO ULTRASOUND RIGHT BREAST COMPARISON:  Previous exam(s). ACR Breast Density Category c: The breast tissue is heterogeneously dense, which may obscure small masses. FINDINGS: There is an irregular mass ill-defined margins in the posterior aspect of the upper-outer quadrant of the right breast, corresponding to the mass felt by the patient, marked with a metallic marker. There is also a single right axillary lymph node which is slightly larger and denser than the other nodes. No findings elsewhere in either breast suspicious for malignancy. Mammographic images were processed with CAD. On physical exam, the patient has an approximately 3 cm oval, firm, palpable mass in the 10:30 o'clock position of the right breast, 8 cm from the nipple. There are no palpable right axillary lymph nodes. Targeted ultrasound is performed, showing an irregular hypoechoic mass with ill-defined surrounding increased echogenicity in the 10:30 o'clock position of the right breast, 8 cm from the nipple. This measures approximately 2.9 x  2.5 x 2.3 cm in maximum dimensions. Ultrasound of the right axilla demonstrates a single right axillary lymph node with focal eccentric cortical thickening measuring 5.4 mm in maximum thickness. IMPRESSION: 1. 2.9 cm palpable mass in the 10:30 o'clock position of the right breast with imaging features highly suspicious for malignancy. 2. Single right axillary lymph node with focal eccentric cortical thickening suspicious for a metastatic lymph node. RECOMMENDATION: Ultrasound-guided core needle biopsy of the 2.9 cm mass in the 10:30 o'clock position of the right breast and ultrasound-guided core needle biopsy of the abnormal right axillary lymph node. This has been discussed with the patient and will be scheduled in consultation with her referring provider. I have discussed the findings and recommendations with the patient. If applicable, a reminder letter will be sent to the patient regarding the next appointment. BI-RADS CATEGORY  5: Highly suggestive of malignancy. Electronically Signed   By: Claudie Revering M.D.   On: 09/18/2019 11:15     Assessment and plan- Patient is a 69 y.o. female who presents to [facility] for initial meeting in preparation for starting chemotherapy for the treatment of breast cancer.   1. HPI: Mrs. Benjamine Mola 69 year old female with past medical history significant for hypertension, obesity, osteopenia, and psoriasis with recent diagnosis of right breast cancer.  Patient self palpated lump in right breast and subsequently had imaging revealing above diagnosis.  Imaging also revealed right axillary lymph node involvement.  Sent to Dr. Bary Castilla for surgical evaluation and biopsy. Will begin chemo next week with TC. Muga  scan will be completed today.  Pet scan is scheduled for tomorrow.   2. Chemo Care Clinic/High Risk for ER/Hospitalization during chemotherapy- We discussed the role of the chemo care clinic and identified patient specific risk factors. I discussed that patient was  identified as high risk primarily based on comorbidities and stage of disease.  Patient has past medical history positive for: Past Medical History:  Diagnosis Date  . Cyst, breast    benign  . H/O: rheumatic fever   . 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 Hyperlipidemia   . Hypertension   . Menopause    at age 41    Patient has past surgical history positive for: Past Surgical History:  Procedure Laterality Date  . APPENDECTOMY  2006  . BREAST CYST ASPIRATION Left 1999  . CHOLECYSTECTOMY  2006  . VAGINAL DELIVERY     x3    Based on our high risk symptom management report; this patient has a high risk of ED utilization.  The percentage below indicates how "at risk "  this patient based on the factors in this table within one year.   General Risk Score: 2  Values used to calculate this score:   Points  Metrics      1        Age: 17      0        Hospital Admissions: 0      0        ED Visits: 0      0        Has Chronic Obstructive Pulmonary Disease: No      1        Has Diabetes: Yes      0        Has Congestive Heart Failure: No      0        Has liver disease: No      0        Has Depression: No      0        Current PCP: [name], MD      0        Has Medicaid: No   3. We discussed that social determinants of health may have significant impacts on health and outcomes for cancer patients.  Today we discussed specific social determinants of performance status, alcohol use, depression, financial needs, food insecurity, housing, interpersonal violence, social connections, stress, tobacco use, and transportation.    After lengthy discussion the following were identified as areas of need: None at this time  Outpatient services: We discussed options including home based and outpatient services, DME and care program. We discusssed that patients who participate in regular physical activity report fewer negative  impacts of cancer and treatments and report less fatigue.   Financial Concerns: We discussed that living with cancer can create tremendous financial burden.  We discussed options for assistance. I asked that if assistance is needed in affording medications or paying bills to please let us know so that we can provide assistance. We discussed options for food including social services, [employer] ($50 every 2 weeks) and onsite food pantry.  We will also notify [facility] to see if cancer center can provide additional support.  Referral to Social work: Introduced Education officer, museum Elease Etienne and the services he can provide such as support with [employer],  cell phone and gas vouchers.   Support groups: We discussed options for support groups at the cancer center. If interested, please notify nurse navigator to enroll. We discussed options for managing stress including healthy eating, exercise as well as participating in no charge counseling services at the cancer center and support groups.  If these are of interest, patient can notify either myself or primary nursing team.We discussed options for management including medications and referral to quit Smart program  Transportation: We discussed options for transportation including acta, paratransit, bus routes, link transit, taxi/uber/lyft, and cancer center [place].  I have notified primary oncology team who will help assist with arranging Lucianne Lei transportation for appointments when/if needed. We also discussed options for transportation on short notice/acute visits.  Palliative care services: We have palliative care services available in the cancer center to discuss goals of care and advanced care planning.  Please let us know if you have any questions or would like to speak to our palliative nurse practitioner.  Symptom Management Clinic: We discussed our symptom management clinic which is available for acute concerns while receiving treatment such as  nausea, vomiting or diarrhea.  We can be reached via telephone at [phone] or through my chart.  We are available for virtual or in person visits on the same day from 830 to 4 PM Monday through Friday. She denies needing specific assistance at this time and She will be followed by Dr. Rogue Bussing clinical team.  Plan: Discussed symptom management clinic. Discussed palliative care services. Discussed resources that are available here at the cancer center. Discussed medications and new prescriptions to begin treatment such as anti-nausea or steroids.   Disposition: RTC later today for MUGA scan.  RTC on 10/03/2019 for PET scan.  RTC on 10/06/2019 for port placement. RTC on 10/07/2019 for first chemotherapy.   Visit Diagnosis 1. Neoplasm of uncertain behavior of upper outer quadrant of female breast, right     Patient expressed understanding and was in agreement with this plan. She also understands that She can call clinic at any time with any questions, concerns, or complaints.   Greater than 50% was spent in counseling and coordination of care with this patient including but not limited to discussion of the relevant topics above (See A&P) including, but not limited to diagnosis and management of acute and chronic medical conditions.   [facility] [place]  CC: Dr. Rogue Bussing

## 2019-09-30 NOTE — Progress Notes (Addendum)
On 07/06-spoke to patient regarding results of breast cancer receptors profile ' triple negative'.  Recommend neoadjuvant chemotherapy with carbotaxol-Adriamycin Cytoxan.  MUGA scan this morning.  Also discussed with Dr. Burnett.     START ON PATHWAY REGIMEN - Breast     A cycle is every 21 days (cycles 1-4):     Paclitaxel      Carboplatin    A cycle is every 14 days (cycles 5-8):     Doxorubicin      Cyclophosphamide      Pegfilgrastim-[id number]   **Always confirm dose/schedule in your pharmacy ordering system**  Patient Characteristics: Preoperative or Nonsurgical Candidate (Clinical Staging), Neoadjuvant Therapy followed by Surgery, Invasive Disease, Chemotherapy, HER2 Negative/Unknown/Equivocal, ER Negative/Unknown, Platinum Therapy Indicated Therapeutic Status: Preoperative or Nonsurgical Candidate (Clinical Staging) AJCC M Category: cM0 AJCC Grade: G3 Breast Surgical Plan: Neoadjuvant Therapy followed by Surgery ER Status: Negative (-) AJCC 8 Stage Grouping: IIB HER2 Status: Negative (-) AJCC T Category: cT1c AJCC N Category: cN1 PR Status: Negative (-) Type of Therapy: Platinum Therapy Indicated Intent of Therapy: Curative Intent, Discussed with Patient  

## 2019-10-01 ENCOUNTER — Encounter: Payer: Self-pay | Admitting: *Deleted

## 2019-10-01 ENCOUNTER — Other Ambulatory Visit
Admission: RE | Admit: 2019-10-01 | Discharge: 2019-10-01 | Disposition: A | Discharge status: Home / Self Care | Payer: PPO | Source: Ambulatory Visit | Attending: General Surgery | Admitting: General Surgery

## 2019-10-01 DIAGNOSIS — D4861 Neoplasm of uncertain behavior of right breast: Secondary | ICD-10-CM

## 2019-10-01 DIAGNOSIS — Z01812 Encounter for preprocedural laboratory examination: Secondary | ICD-10-CM | POA: Insufficient documentation

## 2019-10-01 HISTORY — DX: Gastro-esophageal reflux disease without esophagitis: K21.9

## 2019-10-01 NOTE — Research (Signed)
T/C made to patient Ashlee Cox at Dr. Aletha Halim request to determine whether patient is interested in participating in the WF [phone] study or the SWOG S1714 study. Briefly discussed the WF study and patient reported not being interested in traveling to [facility] for Cardiac MRIs. Also informed patient about the SWOG [phone] neuropathy study and her eligibility due to being scheduled to begin Taxol chemotherapy next week. Patient reports her sister has diabetic neuropathy and she is very interested in learning more about this study. Brief overview of the study procedure and time points presented to patient including required study labs on day 1, study questionnaires, and description of the neuropen and tuning fork assessments to measure sensation in foot, leg, hand and arm. Patient requested more information and was open to having the study consent form sent to her via email. Verified patient's email address and the consent form was emailed to her along with contact information for myself and Ashlee Cox in the event she has any questions about the study. Dr. Rogue Bussing was notified of the conversation and states he will call the patient later today to discuss the study with her. Ashlee Cox, BSN, MHA, OCN 10/01/2019 1:19 PM

## 2019-10-01 NOTE — Patient Instructions (Signed)
Your procedure is scheduled on: Monday 10/06/19.  Report to DAY SURGERY DEPARTMENT LOCATED ON 2ND FLOOR MEDICAL MALL ENTRANCE. To find out your arrival time please call [phone] between 1PM - 3PM on Friday 10/03/19.   Remember: Instructions that are not followed completely may result in serious medical risk, up to and including death, or upon the discretion of your surgeon and anesthesiologist your surgery may need to be rescheduled.      _X__ 1. Do not eat food after midnight the night before your procedure.                 No gum chewing or hard candies. You may drink SUGAR FREE  clear liquids up to 2 hours                 before you are scheduled to arrive for your surgery- DO NOT drink clear                 liquids within 2 hours of the start of your surgery.                    __X__2.  On the morning of surgery brush your teeth with toothpaste and water, you may rinse your mouth with mouthwash if you wish.  Do not swallow any toothpaste or mouthwash.       _X__ 3.  No Alcohol for 24 hours before or after surgery.      _X__ 4.  Do Not Smoke or use e-cigarettes For 24 Hours Prior to Your Surgery.                 Do not use any chewable tobacco products for at least 6 hours prior to                 surgery.    __X__5.  Notify your doctor if there is any change in your medical condition      (cold, fever, infections).       Do not wear jewelry, make-up, hairpins, clips or nail polish. Do not wear lotions, powders, or perfumes.  Do not shave 48 hours prior to surgery. Men may shave face and neck. Do not bring valuables to the hospital.     [facility] is not responsible for any belongings or valuables.   Contacts, dentures/partials or body piercings may not be worn into surgery. Bring a case for your contacts, glasses or hearing aids, a denture cup will be supplied.    Patients discharged the day of surgery will not be allowed to drive home.     __X__ Take these  medicines the morning of surgery with A SIP OF WATER:     1. omeprazole (PRILOSEC)    __X__ Use CHG Soap as directed    __X__ Stop Metformin 2 days prior to surgery. Your last dose will be on Friday evening 10/03/19.      __X__ Stop Anti-inflammatories 7 days before surgery such as Advil, Ibuprofen, Motrin, BC or Goodies Powder, Naprosyn, Naproxen, Aleve, Aspirin, Meloxicam. May take Tylenol if needed for pain or discomfort.   __X__ Stop taking APPLE CIDER VINEGAR today, Wednesday 10/01/19.     __X__ Don't start taking any new herbal supplements or vitamins prior to your procedure.

## 2019-10-02 ENCOUNTER — Other Ambulatory Visit: Payer: Self-pay

## 2019-10-02 ENCOUNTER — Encounter: Payer: Self-pay | Admitting: Urgent Care

## 2019-10-02 ENCOUNTER — Telehealth: Payer: Self-pay | Admitting: Internal Medicine

## 2019-10-02 ENCOUNTER — Encounter
Admission: RE | Admit: 2019-10-02 | Discharge: 2019-10-02 | Disposition: A | Discharge status: Home / Self Care | Payer: PPO | Source: Ambulatory Visit | Attending: General Surgery | Admitting: General Surgery

## 2019-10-02 DIAGNOSIS — Z01810 Encounter for preprocedural cardiovascular examination: Secondary | ICD-10-CM | POA: Diagnosis not present

## 2019-10-02 DIAGNOSIS — C50911 Malignant neoplasm of unspecified site of right female breast: Secondary | ICD-10-CM | POA: Diagnosis not present

## 2019-10-02 DIAGNOSIS — Z20822 Contact with and (suspected) exposure to covid-19: Secondary | ICD-10-CM | POA: Diagnosis not present

## 2019-10-02 DIAGNOSIS — Z01818 Encounter for other preprocedural examination: Secondary | ICD-10-CM | POA: Diagnosis not present

## 2019-10-02 LAB — SARS CORONAVIRUS 2 (TAT 6-24 HRS): SARS Coronavirus 2: NEGATIVE

## 2019-10-02 NOTE — Telephone Encounter (Signed)
On 7/7-called patient to discuss the option of clinical trials.  Left voicemail.  Follow-up as planned.

## 2019-10-03 ENCOUNTER — Ambulatory Visit
Admission: RE | Admit: 2019-10-03 | Discharge: 2019-10-03 | Disposition: A | Discharge status: Home / Self Care | Payer: PPO | Source: Ambulatory Visit | Attending: Internal Medicine | Admitting: Internal Medicine

## 2019-10-03 DIAGNOSIS — D4861 Neoplasm of uncertain behavior of right breast: Secondary | ICD-10-CM | POA: Insufficient documentation

## 2019-10-03 DIAGNOSIS — I700 Atherosclerosis of aorta: Secondary | ICD-10-CM | POA: Diagnosis not present

## 2019-10-03 DIAGNOSIS — C773 Secondary and unspecified malignant neoplasm of axilla and upper limb lymph nodes: Secondary | ICD-10-CM | POA: Insufficient documentation

## 2019-10-03 DIAGNOSIS — K449 Diaphragmatic hernia without obstruction or gangrene: Secondary | ICD-10-CM | POA: Insufficient documentation

## 2019-10-03 DIAGNOSIS — C50919 Malignant neoplasm of unspecified site of unspecified female breast: Secondary | ICD-10-CM | POA: Diagnosis not present

## 2019-10-03 DIAGNOSIS — K760 Fatty (change of) liver, not elsewhere classified: Secondary | ICD-10-CM | POA: Diagnosis not present

## 2019-10-03 DIAGNOSIS — N6489 Other specified disorders of breast: Secondary | ICD-10-CM | POA: Diagnosis not present

## 2019-10-03 LAB — GLUCOSE, CAPILLARY: Glucose-Capillary: 124 mg/dL — ABNORMAL HIGH (ref 70–99)

## 2019-10-03 MED ORDER — FLUDEOXYGLUCOSE F - 18 (FDG) INJECTION
9.9000 | Freq: Once | INTRAVENOUS | Status: AC | PRN
  Administered 2019-10-03: 10.29 via INTRAVENOUS

## 2019-10-06 ENCOUNTER — Ambulatory Visit: Payer: PPO

## 2019-10-06 ENCOUNTER — Encounter
Admission: RE | Disposition: A | Discharge status: Home / Self Care | Payer: Self-pay | Source: Home / Self Care | Attending: General Surgery

## 2019-10-06 ENCOUNTER — Other Ambulatory Visit: Payer: Self-pay

## 2019-10-06 ENCOUNTER — Ambulatory Visit: Payer: PPO | Admitting: Anesthesiology

## 2019-10-06 ENCOUNTER — Encounter: Payer: Self-pay | Admitting: Internal Medicine

## 2019-10-06 ENCOUNTER — Ambulatory Visit
Admission: RE | Admit: 2019-10-06 | Discharge: 2019-10-06 | Disposition: A | Discharge status: Home / Self Care | Payer: PPO | Attending: General Surgery | Admitting: General Surgery

## 2019-10-06 ENCOUNTER — Encounter: Payer: Self-pay | Admitting: General Surgery

## 2019-10-06 DIAGNOSIS — K219 Gastro-esophageal reflux disease without esophagitis: Secondary | ICD-10-CM | POA: Insufficient documentation

## 2019-10-06 DIAGNOSIS — Z79899 Other long term (current) drug therapy: Secondary | ICD-10-CM | POA: Insufficient documentation

## 2019-10-06 DIAGNOSIS — D4861 Neoplasm of uncertain behavior of right breast: Secondary | ICD-10-CM

## 2019-10-06 DIAGNOSIS — C50911 Malignant neoplasm of unspecified site of right female breast: Secondary | ICD-10-CM | POA: Insufficient documentation

## 2019-10-06 DIAGNOSIS — Z7984 Long term (current) use of oral hypoglycemic drugs: Secondary | ICD-10-CM | POA: Insufficient documentation

## 2019-10-06 DIAGNOSIS — E785 Hyperlipidemia, unspecified: Secondary | ICD-10-CM | POA: Diagnosis not present

## 2019-10-06 DIAGNOSIS — I10 Essential (primary) hypertension: Secondary | ICD-10-CM | POA: Diagnosis not present

## 2019-10-06 DIAGNOSIS — Z87891 Personal history of nicotine dependence: Secondary | ICD-10-CM | POA: Insufficient documentation

## 2019-10-06 DIAGNOSIS — C50411 Malignant neoplasm of upper-outer quadrant of right female breast: Secondary | ICD-10-CM | POA: Diagnosis not present

## 2019-10-06 DIAGNOSIS — E11319 Type 2 diabetes mellitus with unspecified diabetic retinopathy without macular edema: Secondary | ICD-10-CM | POA: Diagnosis not present

## 2019-10-06 DIAGNOSIS — Z95828 Presence of other vascular implants and grafts: Secondary | ICD-10-CM

## 2019-10-06 DIAGNOSIS — Z452 Encounter for adjustment and management of vascular access device: Secondary | ICD-10-CM | POA: Diagnosis not present

## 2019-10-06 HISTORY — PX: SUBMUCOSAL TATTOO INJECTION: SHX6856

## 2019-10-06 HISTORY — PX: PORTACATH PLACEMENT: SHX2246

## 2019-10-06 LAB — GLUCOSE, CAPILLARY
Glucose-Capillary: 147 mg/dL — ABNORMAL HIGH (ref 70–99)
Glucose-Capillary: 133 mg/dL — ABNORMAL HIGH (ref 70–99)

## 2019-10-06 SURGERY — INSERTION, TUNNELED CENTRAL VENOUS DEVICE, WITH PORT
Anesthesia: General | Laterality: Right

## 2019-10-06 MED ORDER — CHLORHEXIDINE GLUCONATE 0.12 % MT SOLN: 15.0000 mL | Freq: Once | OROMUCOSAL | Status: AC

## 2019-10-06 MED ORDER — LIDOCAINE HCL (PF) 1 % IJ SOLN
INTRAMUSCULAR | Status: DC | PRN
  Administered 2019-10-06: 10 mL via SUBCUTANEOUS

## 2019-10-06 MED ORDER — SPOT INK MARKER SYRINGE KIT
PACK | SUBMUCOSAL | Status: DC | PRN
  Administered 2019-10-06: 1 mL via SUBMUCOSAL

## 2019-10-06 MED ORDER — LIDOCAINE HCL (PF) 1 % IJ SOLN
INTRAMUSCULAR | Status: AC
  Filled 2019-10-06: qty 30

## 2019-10-06 MED ORDER — SODIUM CHLORIDE (PF) 0.9 % IJ SOLN
INTRAMUSCULAR | Status: DC | PRN
  Administered 2019-10-06: 20 mL via INTRAVENOUS

## 2019-10-06 MED ORDER — GLYCOPYRROLATE 0.2 MG/ML IJ SOLN
INTRAMUSCULAR | Status: AC
  Filled 2019-10-06: qty 1

## 2019-10-06 MED ORDER — ONDANSETRON HCL 4 MG/2ML IJ SOLN
INTRAMUSCULAR | Status: DC | PRN
  Administered 2019-10-06: 4 mg via INTRAVENOUS

## 2019-10-06 MED ORDER — CEFAZOLIN SODIUM-DEXTROSE 2-4 GM/100ML-% IV SOLN
2.0000 g | INTRAVENOUS | Status: AC
  Administered 2019-10-06: 2 g via INTRAVENOUS

## 2019-10-06 MED ORDER — MIDAZOLAM HCL 5 MG/5ML IJ SOLN
INTRAMUSCULAR | Status: DC | PRN
  Administered 2019-10-06: 2 mg via INTRAVENOUS

## 2019-10-06 MED ORDER — ONDANSETRON HCL 4 MG/2ML IJ SOLN: 4.0000 mg | Freq: Once | INTRAMUSCULAR | Status: DC | PRN

## 2019-10-06 MED ORDER — FENTANYL CITRATE (PF) 100 MCG/2ML IJ SOLN
INTRAMUSCULAR | Status: DC | PRN
  Administered 2019-10-06: 50 ug via INTRAVENOUS
  Administered 2019-10-06 (×2): 25 ug via INTRAVENOUS

## 2019-10-06 MED ORDER — METHYLENE BLUE 0.5 % INJ SOLN
INTRAVENOUS | Status: AC
  Filled 2019-10-06: qty 10

## 2019-10-06 MED ORDER — PROPOFOL 500 MG/50ML IV EMUL
INTRAVENOUS | Status: DC | PRN
  Administered 2019-10-06: 100 ug/kg/min via INTRAVENOUS

## 2019-10-06 MED ORDER — MIDAZOLAM HCL 2 MG/2ML IJ SOLN
INTRAMUSCULAR | Status: AC
  Filled 2019-10-06: qty 2

## 2019-10-06 MED ORDER — FENTANYL CITRATE (PF) 100 MCG/2ML IJ SOLN: 25.0000 ug | INTRAMUSCULAR | Status: DC | PRN

## 2019-10-06 MED ORDER — PROPOFOL 10 MG/ML IV BOLUS
INTRAVENOUS | Status: AC
  Filled 2019-10-06: qty 20

## 2019-10-06 MED ORDER — FENTANYL CITRATE (PF) 100 MCG/2ML IJ SOLN
INTRAMUSCULAR | Status: AC
  Filled 2019-10-06: qty 2

## 2019-10-06 MED ORDER — ONDANSETRON HCL 4 MG/2ML IJ SOLN
INTRAMUSCULAR | Status: AC
  Filled 2019-10-06: qty 2

## 2019-10-06 MED ORDER — CEFAZOLIN SODIUM-DEXTROSE 2-4 GM/100ML-% IV SOLN
INTRAVENOUS | Status: AC
  Filled 2019-10-06: qty 100

## 2019-10-06 MED ORDER — ORAL CARE MOUTH RINSE: 15.0000 mL | Freq: Once | OROMUCOSAL | Status: AC

## 2019-10-06 MED ORDER — LIDOCAINE 2% (20 MG/ML) 5 ML SYRINGE
INTRAMUSCULAR | Status: DC | PRN
  Administered 2019-10-06: 50 mg via INTRAVENOUS

## 2019-10-06 MED ORDER — LIDOCAINE HCL (PF) 2 % IJ SOLN
INTRAMUSCULAR | Status: AC
  Filled 2019-10-06: qty 5

## 2019-10-06 MED ORDER — GLYCOPYRROLATE 0.2 MG/ML IJ SOLN
INTRAMUSCULAR | Status: DC | PRN
  Administered 2019-10-06: .2 mg via INTRAVENOUS

## 2019-10-06 MED ORDER — CHLORHEXIDINE GLUCONATE 0.12 % MT SOLN
OROMUCOSAL | Status: AC
  Administered 2019-10-06: 15 mL via OROMUCOSAL
  Filled 2019-10-06: qty 15

## 2019-10-06 MED ORDER — SODIUM CHLORIDE 0.9 % IV SOLN: INTRAVENOUS | Status: DC

## 2019-10-06 SURGICAL SUPPLY — 33 items
BAG DECANTER FOR FLEXI CONT (MISCELLANEOUS) ×3 IMPLANT
BENZOIN TINCTURE PRP APPL 2/3 (GAUZE/BANDAGES/DRESSINGS) ×3 IMPLANT
BLADE SURG 15 STRL SS SAFETY (BLADE) ×3 IMPLANT
CHLORAPREP W/TINT 26 (MISCELLANEOUS) ×3 IMPLANT
COVER LIGHT HANDLE STERIS (MISCELLANEOUS) IMPLANT
COVER WAND RF STERILE (DRAPES) ×3 IMPLANT
DECANTER SPIKE VIAL GLASS SM (MISCELLANEOUS) ×3 IMPLANT
DRAPE C-ARM XRAY 36X54 (DRAPES) ×3 IMPLANT
DRAPE LAPAROTOMY TRNSV 106X77 (MISCELLANEOUS) ×3 IMPLANT
DRSG TEGADERM 2-3/8X2-3/4 SM (GAUZE/BANDAGES/DRESSINGS) ×3 IMPLANT
DRSG TEGADERM 4X4.75 (GAUZE/BANDAGES/DRESSINGS) ×3 IMPLANT
DRSG TELFA 4X3 1S NADH ST (GAUZE/BANDAGES/DRESSINGS) ×3 IMPLANT
ELECT REM PT RETURN 9FT ADLT (ELECTROSURGICAL) ×3
ELECTRODE REM PT RTRN 9FT ADLT (ELECTROSURGICAL) ×2 IMPLANT
GLOVE BIO SURGEON STRL SZ7.5 (GLOVE) ×3 IMPLANT
GLOVE INDICATOR 8.0 STRL GRN (GLOVE) ×3 IMPLANT
GOWN STRL REUS W/ TWL LRG LVL3 (GOWN DISPOSABLE) ×4 IMPLANT
GOWN STRL REUS W/TWL LRG LVL3 (GOWN DISPOSABLE) ×6
IV NS 500ML (IV SOLUTION) ×3
IV NS 500ML BAXH (IV SOLUTION) ×2 IMPLANT
KIT PORT POWER 8FR ISP CVUE (Port) ×3 IMPLANT
KIT TURNOVER KIT A (KITS) ×3 IMPLANT
LABEL OR SOLS (LABEL) ×3 IMPLANT
PACK PORT-A-CATH (MISCELLANEOUS) ×3 IMPLANT
PAD TELFA 2X3 NADH STRL (GAUZE/BANDAGES/DRESSINGS) ×3 IMPLANT
STRIP CLOSURE SKIN 1/2X4 (GAUZE/BANDAGES/DRESSINGS) ×3 IMPLANT
SUT PROLENE 3 0 SH DA (SUTURE) ×3 IMPLANT
SUT VIC AB 3-0 SH 27 (SUTURE) ×3
SUT VIC AB 3-0 SH 27X BRD (SUTURE) ×2 IMPLANT
SUT VIC AB 4-0 FS2 27 (SUTURE) ×3 IMPLANT
SWABSTK COMLB BENZOIN TINCTURE (MISCELLANEOUS) ×3 IMPLANT
SYR 10ML LL (SYRINGE) ×3 IMPLANT
SYR 10ML SLIP (SYRINGE) ×3 IMPLANT

## 2019-10-06 NOTE — Anesthesia Preprocedure Evaluation (Addendum)
Anesthesia Evaluation  Patient identified by MRN, date of birth, ID band Patient awake    Reviewed: Allergy & Precautions, NPO status , Patient's Chart, lab work & pertinent test results  Airway Mallampati: III       Dental  (+) Upper Dentures, Lower Dentures   Pulmonary former smoker,    Pulmonary exam normal        Cardiovascular hypertension, Normal cardiovascular exam     Neuro/Psych negative neurological ROS  negative psych ROS   GI/Hepatic GERD  ,  Endo/Other  diabetes  Renal/GU   Female GU complaint     Musculoskeletal negative musculoskeletal ROS (+)   Abdominal Normal abdominal exam  (+)   Peds negative pediatric ROS (+)  Hematology negative hematology ROS (+)   Anesthesia Other Findings Past Medical History: No date: Cyst, breast     Comment:  benign No date: GERD (gastroesophageal reflux disease) No date: H/O: rheumatic fever 2010: History of colonoscopy     Comment:  done bc of bleeding,  normal , due back in 5 yrs               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 date: Hyperlipidemia No date: Hypertension No date: Menopause     Comment:  at age 69  Reproductive/Obstetrics                            Anesthesia Physical Anesthesia Plan  ASA: III  Anesthesia Plan: General   Post-op Pain Management:    Induction:   PONV Risk Score and Plan: TIVA  Airway Management Planned: Nasal Cannula  Additional Equipment:   Intra-op Plan:   Post-operative Plan:   Informed Consent: I have reviewed the patients History and Physical, chart, labs and discussed the procedure including the risks, benefits and alternatives for the proposed anesthesia with the patient or authorized representative who has indicated his/her understanding and acceptance.     Dental advisory given  Plan Discussed with: CRNA and Surgeon  Anesthesia Plan Comments:         Anesthesia Quick Evaluation

## 2019-10-06 NOTE — Transfer of Care (Signed)
Immediate Anesthesia Transfer of Care Note  Patient: Ashlee Cox  Procedure(s) Performed: INSERTION PORT-A-CATH (Left ) Right axillary TATTOO INJECTION (Right )  Patient Location: PACU  Anesthesia Type:General  Level of Consciousness: awake, alert  and oriented  Airway & Oxygen Therapy: Patient Spontanous Breathing and Patient connected to nasal cannula oxygen  Post-op Assessment: Report given to RN and Post -op Vital signs reviewed and stable  Post vital signs: Reviewed  Last Vitals:  Vitals Value Taken Time  BP    Temp    Pulse 72 10/06/19 0935  Resp    SpO2 96 % 10/06/19 0935  Vitals shown include unvalidated device data.  Last Pain:  Vitals:   10/06/19 0719  TempSrc: Tympanic  PainSc: 0-No pain         Complications: No complications documented.

## 2019-10-06 NOTE — Progress Notes (Signed)
Patient was called for pre assessment. She states she would like to discuss treatment plan and to see what results were from last test that was done.

## 2019-10-06 NOTE — Discharge Instructions (Signed)

## 2019-10-06 NOTE — H&P (Signed)
TKAI LARGE [id number] 04-Sep-1950     HPI:  69 y/o woman with Stage II breast cancer who is a candidate for neo-adjuvant chemotherapy.  For port placement. Will tattoo right axillary node to facilitate removal when she comes to surgery later this year.   Medications Prior to Admission  Medication Sig Dispense Refill Last Dose  . APPLE CIDER VINEGAR PO Take 450 mg by mouth daily.   Past Week at Unknown time  . docusate sodium (COLACE) 100 MG capsule Take 100 mg by mouth daily.   10/05/2019 at Unknown time  . hydrochlorothiazide (MICROZIDE) 12.5 MG capsule Take 12.5 mg by mouth daily.   10/05/2019 at Unknown time  . losartan (COZAAR) 50 MG tablet Take 50 mg by mouth daily.   10/05/2019 at Unknown time  . [START ON 10/17/2019] losartan-hydrochlorothiazide (HYZAAR) 50-12.5 MG tablet Take 1 tablet by mouth daily. 90 tablet 3 10/05/2019 at Unknown time  . lovastatin (MEVACOR) 40 MG tablet TAKE 1 TABLET BY MOUTH AT BEDTIME (Patient taking differently: Take 40 mg by mouth in the morning. ) 90 tablet 3 10/05/2019 at Unknown time  . metFORMIN (GLUCOPHAGE) 500 MG tablet Take 1 tablet (500 mg total) by mouth 2 (two) times daily with a meal. 180 tablet 3 10/04/2019  . Multiple Vitamin (MULTIVITAMIN) tablet Take 1 tablet by mouth daily.     10/05/2019 at Unknown time  . omeprazole (PRILOSEC) 20 MG capsule Take 1 capsule by mouth once daily (Patient taking differently: Take 20 mg by mouth daily. ) 90 capsule 0 10/06/2019 at Unknown time  . ondansetron (ZOFRAN) 8 MG tablet One pill every 8 hours as needed for nausea/vomitting. 40 tablet 1 10/05/2019 at Unknown time  . prochlorperazine (COMPAZINE) 10 MG tablet Take 1 tablet (10 mg total) by mouth every 6 (six) hours as needed for nausea or vomiting. 40 tablet 1 10/05/2019 at Unknown time  . lidocaine-prilocaine (EMLA) cream Apply 1 application topically as needed. 30 g 0    No Known Allergies Past Medical History:  Diagnosis Date  . Cyst, breast    benign  . GERD  (gastroesophageal reflux disease)   . H/O: rheumatic fever   . 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 Hyperlipidemia   . Hypertension   . Menopause    at age 37   Past Surgical History:  Procedure Laterality Date  . APPENDECTOMY  2006  . BREAST CYST ASPIRATION Left 1999  . CHOLECYSTECTOMY  2006  . VAGINAL DELIVERY     x3   Social History   Socioeconomic History  . Marital status: Widowed    Spouse name: Not on file  . Number of children: Not on file  . Years of education: Not on file  . Highest education level: Not on file  Occupational History  . Occupation: Glass blower/designer: [id number]  Tobacco Use  . Smoking status: Former Smoker    Years: 1.00    Types: Cigarettes    Quit date: 12/26/2005    Years since quitting: 13.7  . Smokeless tobacco: Never Used  . Tobacco comment: smoked for less than 1 years,  1 cig/day  Substance and Sexual Activity  . Alcohol use: No  . Drug use: No  . Sexual activity: Never    Partners: Female  Other Topics Concern  . Not on file  Social History Narrative   Widowed, March 2014; lives with fiance.  [employer] ret[employer]; quit smoking 1ppw; no alcohol.       Social Determinants of Health   Financial Resource Strain:   . Difficulty of Paying Living Expenses:   Food Insecurity:   . Worried About Charity fundraiser in the Last Year:   . Arboriculturist in the Last Year:   Transportation Needs:   . Film/video editor (Medical):   Marland Kitchen Lack of Transportation (Non-Medical):   Physical Activity:   . Days of Exercise per Week:   . Minutes of Exercise per Session:   Stress:   . Feeling of Stress :   Social Connections:   . Frequency of Communication with Friends and Family:   . Frequency of Social Gatherings with Friends and Family:   . Attends Religious Services:   . Active Member of Clubs or Organizations:   . Attends Archivist Meetings:   Marland Kitchen Marital Status:    Intimate Partner Violence:   . Fear of Current or Ex-Partner:   . Emotionally Abused:   Marland Kitchen Physically Abused:   . Sexually Abused:    Social History   Social History Narrative   Widowed, March 2014; lives with fiance.      [employer]; quit smoking 1ppw; no alcohol.         ROS: Negative.     PE: HEENT: Negative. Lungs: Clear. Cardio: RR.  Assessment/Plan:  Proceed with planned left power port p;acement and tattoo of right axillary lymph node.    Forest Gleason [facility] 10/06/2019

## 2019-10-06 NOTE — Research (Signed)
Research nurse spoke with this patient over the telephone today in regards to her participation in the SWOG [id number] protocol for peripheral neuropathy. The patient confirmed that Dr. Rogue Bussing had reviewed the protocol with her at her last visit and she does want to participate. The patient has agreed to come in early for her appointment tomorrow at 0830 am 10/07/2019 to review the protocol consent / hipaa prior to her lab appointment at 0900 am. Research RN will meet the patient in the lobby at registration in the morning.  Jeral Fruit, RN, BSN, OCN 10/06/2019 1408 pm

## 2019-10-06 NOTE — Op Note (Signed)
Preoperative diagnosis: Stage II carcinoma the right breast.  Postoperative diagnosis: Same.  Operative procedure: 1) right subclavian PowerPort placement with ultrasound and fluoroscopic guidance; 2) tattoo of right axillary lymph node.  Operating surgeon: Hervey Ard, MD  Anesthesia: Monitored local anesthesia, 10 cc 1% plain Xylocaine.  Estimated blood loss: Less than 5 cc.  Clinical note: This 69 year old woman with recent identified with a right breast mass and biopsy of both the mass and a large right axillary lymph node showed evidence of invasive mammary carcinoma.  She is a candidate for neoadjuvant treatment with the diagnosis of triple negative disease.  The patient received Ancef on induction of anesthesia.   Operative note: The left chest and neck was cleansed with ChloraPrep and draped.  10 cc of local anesthetic was infiltrated for comfort.  Ultrasound was used to confirm patency of the left subclavian vein.  This was cannulated under ultrasound guidance.  Guidewire was advanced into the SVC followed by the dilator.  The catheter was placed in position at the junction of the SVC and right atrium.  It easily aspirated at this position.  It was then tunneled to a pocket on the left anterior chest that had been previously made.  The port was connected to the catheter and anchored to the deep tissue with 3-0 Prolene sutures x2.  The adipose tissue was closed with a running 3-0 Vicryl suture.  The skin was closed with a running 4-0 Vicryl subcuticular suture.  Benzoin and Steri-Strips were applied.  The port was cannulated and aspirated and then irrigated with injectable saline.  As the chemotherapy is to begin tomorrow the port was left cannulated and covered with a Telfa and Tegaderm dressing.  The axillary incision was treated in a similar fashion.  Ultrasound was used to the identify the right axillary lymph node after cleansing the skin with ChloraPrep.  1 cc of "spot" was placed  into the node and ultrasound imaging obtained.  Band-Aid applied.  Patient tolerated the procedures well.  Erect portable chest x-ray in the recovery room showed the catheter tip as described above and no evidence of pneumothorax.

## 2019-10-07 ENCOUNTER — Inpatient Hospital Stay: Payer: PPO

## 2019-10-07 ENCOUNTER — Encounter: Payer: Self-pay | Admitting: Internal Medicine

## 2019-10-07 ENCOUNTER — Inpatient Hospital Stay: Payer: PPO | Admitting: Internal Medicine

## 2019-10-07 ENCOUNTER — Other Ambulatory Visit: Payer: Self-pay

## 2019-10-07 VITALS — BP 129/78 | HR 76 | Resp 16

## 2019-10-07 DIAGNOSIS — D4861 Neoplasm of uncertain behavior of right breast: Secondary | ICD-10-CM

## 2019-10-07 DIAGNOSIS — Z5111 Encounter for antineoplastic chemotherapy: Secondary | ICD-10-CM

## 2019-10-07 DIAGNOSIS — C50211 Malignant neoplasm of upper-inner quadrant of right female breast: Secondary | ICD-10-CM

## 2019-10-07 DIAGNOSIS — Z95828 Presence of other vascular implants and grafts: Secondary | ICD-10-CM

## 2019-10-07 DIAGNOSIS — Z006 Encounter for examination for normal comparison and control in clinical research program: Secondary | ICD-10-CM | POA: Diagnosis not present

## 2019-10-07 DIAGNOSIS — Z171 Estrogen receptor negative status [ER-]: Secondary | ICD-10-CM | POA: Diagnosis not present

## 2019-10-07 LAB — COMPREHENSIVE METABOLIC PANEL
ALT: 46 U/L — ABNORMAL HIGH (ref 0–44)
AST: 38 U/L (ref 15–41)
Albumin: 3.9 g/dL (ref 3.5–5.0)
Alkaline Phosphatase: 70 U/L (ref 38–126)
Anion gap: 8 (ref 5–15)
BUN: 12 mg/dL (ref 8–23)
CO2: 26 mmol/L (ref 22–32)
Calcium: 8.8 mg/dL — ABNORMAL LOW (ref 8.9–10.3)
Chloride: 105 mmol/L (ref 98–111)
Creatinine, Ser: 0.81 mg/dL (ref 0.44–1.00)
GFR calc Af Amer: >60 mL/min (ref >60)
GFR calc non Af Amer: >60 mL/min (ref >60)
Glucose, Bld: 159 mg/dL — ABNORMAL HIGH (ref 70–99)
Potassium: 3.9 mmol/L (ref 3.5–5.1)
Sodium: 139 mmol/L (ref 135–145)
Total Bilirubin: 0.5 mg/dL (ref 0.3–1.2)
Total Protein: 7.4 g/dL (ref 6.5–8.1)

## 2019-10-07 LAB — CBC WITH DIFFERENTIAL/PLATELET
Abs Immature Granulocytes: 0.02 10*3/uL (ref 0.00–0.07)
Basophils Absolute: 0.1 10*3/uL (ref 0.0–0.1)
Basophils Relative: 1 %
Eosinophils Absolute: 0.2 10*3/uL (ref 0.0–0.5)
Eosinophils Relative: 3 %
HCT: 39.1 % (ref 36.0–46.0)
Hemoglobin: 13.3 g/dL (ref 12.0–15.0)
Immature Granulocytes: 0 %
Lymphocytes Relative: 28 %
Lymphs Abs: 1.8 10*3/uL (ref 0.7–4.0)
MCH: 28.5 pg (ref 26.0–34.0)
MCHC: 34 g/dL (ref 30.0–36.0)
MCV: 83.7 fL (ref 80.0–100.0)
Monocytes Absolute: 0.4 10*3/uL (ref 0.1–1.0)
Monocytes Relative: 6 %
Neutro Abs: 4 10*3/uL (ref 1.7–7.7)
Neutrophils Relative %: 62 %
Platelets: 329 10*3/uL (ref 150–400)
RBC: 4.67 MIL/uL (ref 3.87–5.11)
RDW: 13.1 % (ref 11.5–15.5)
WBC: 6.5 10*3/uL (ref 4.0–10.5)
nRBC: 0 % (ref 0.0–0.2)

## 2019-10-07 MED ORDER — SODIUM CHLORIDE 0.9 % IV SOLN
150.0000 mg | Freq: Once | INTRAVENOUS | Status: AC
  Administered 2019-10-07: 150 mg via INTRAVENOUS
  Filled 2019-10-07: qty 150

## 2019-10-07 MED ORDER — HEPARIN SOD (PORK) LOCK FLUSH 100 UNIT/ML IV SOLN
INTRAVENOUS | Status: AC
  Filled 2019-10-07: qty 5

## 2019-10-07 MED ORDER — SODIUM CHLORIDE 0.9 % IV SOLN
10.0000 mg | Freq: Once | INTRAVENOUS | Status: AC
  Administered 2019-10-07: 10 mg via INTRAVENOUS
  Filled 2019-10-07: qty 10

## 2019-10-07 MED ORDER — SODIUM CHLORIDE 0.9 % IV SOLN
80.0000 mg/m2 | Freq: Once | INTRAVENOUS | Status: AC
  Administered 2019-10-07: 162 mg via INTRAVENOUS
  Filled 2019-10-07: qty 27

## 2019-10-07 MED ORDER — DIPHENHYDRAMINE HCL 50 MG/ML IJ SOLN
50.0000 mg | Freq: Once | INTRAMUSCULAR | Status: AC
  Administered 2019-10-07: 50 mg via INTRAVENOUS
  Filled 2019-10-07: qty 1

## 2019-10-07 MED ORDER — SODIUM CHLORIDE 0.9% FLUSH
10.0000 mL | INTRAVENOUS | Status: DC | PRN
  Administered 2019-10-07: 10 mL via INTRAVENOUS
  Filled 2019-10-07: qty 10

## 2019-10-07 MED ORDER — PALONOSETRON HCL INJECTION 0.25 MG/5ML
0.2500 mg | Freq: Once | INTRAVENOUS | Status: AC
  Administered 2019-10-07: 0.25 mg via INTRAVENOUS
  Filled 2019-10-07: qty 5

## 2019-10-07 MED ORDER — SODIUM CHLORIDE 0.9 % IV SOLN
Freq: Once | INTRAVENOUS | Status: AC
  Filled 2019-10-07: qty 250

## 2019-10-07 MED ORDER — SODIUM CHLORIDE 0.9 % IV SOLN
505.5000 mg | Freq: Once | INTRAVENOUS | Status: AC
  Administered 2019-10-07: 510 mg via INTRAVENOUS
  Filled 2019-10-07: qty 51

## 2019-10-07 MED ORDER — FAMOTIDINE IN NACL 20-0.9 MG/50ML-% IV SOLN
20.0000 mg | Freq: Once | INTRAVENOUS | Status: AC
  Administered 2019-10-07: 20 mg via INTRAVENOUS
  Filled 2019-10-07: qty 50

## 2019-10-07 MED ORDER — HEPARIN SOD (PORK) LOCK FLUSH 100 UNIT/ML IV SOLN
500.0000 [IU] | Freq: Once | INTRAVENOUS | Status: AC | PRN
  Administered 2019-10-07: 500 [IU]
  Filled 2019-10-07: qty 5

## 2019-10-07 NOTE — Assessment & Plan Note (Addendum)
#   Right breast cancer-Stage IIB- T2N1-TRIPLE NEGATIVE; PET July 11th-right breast mass right underarm lymphadenopathy.  No distant metastatic disease. MUGA scan- 62%.   # Proceed carb-Taxol Labs today reviewed;  acceptable for treatment today.   Discussed the potential side effects including but not limited to-increasing fatigue, nausea vomiting, diarrhea, hair loss, sores in the mouth, increase risk of infection and also neuropathy.   # # Genetic counseling-   Patient meets the NCCN criteria for genetic testing  [mother with ovarian cancer Sister with melanoma].will refer to genetics.   #Clinical trial: Enrolled in neuropathy swab trial.  Post chemotherapy patient based on final pathology could be candidate for [facility] based clinical trial.  # DISPOSITION: # chemo today # 1 week- labs- cbc/bmp;Taxol # 2 week- labs- cbc/bmp;Taxol # 3 week- labs- cbc/cmp;Taxol-Carbo-Dr.B  # I reviewed the blood work- with the patient in detail; also reviewed the imaging independently [as summarized above]; and with the patient in detail.

## 2019-10-07 NOTE — Progress Notes (Signed)
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 Burnett.]   # [id number] Carbo-taxol-AC #1  # SURVIVORSHIP: Pending  # GENETICS: Pending  DIAGNOSIS: Right breast cancer triple negative  STAGE:   IIB      ;  GOALS: cure  CURRENT/MOST RECENT THERAPY : carbo-taxol-AC    Carcinoma of upper-inner quadrant of right breast in female, estrogen receptor negative ([place])  10/07/2019 Initial Diagnosis   Carcinoma of upper-inner quadrant of right breast in female, estrogen receptor negative ([place])      HISTORY OF PRESENTING ILLNESS:  Ashlee Cox 69 y.o.  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  The interim patient underwent port placement.  Patient is no worse.  Otherwise denies any worsening pain.  No new lumps or bumps.    Review of Systems  Constitutional: Negative for chills, diaphoresis, fever, malaise/fatigue and weight loss.  HENT: Negative for nosebleeds and sore throat.   Eyes: Negative for double vision.  Respiratory: Negative for cough, hemoptysis, sputum production, shortness of breath and wheezing.   Cardiovascular: Negative for chest pain, palpitations, orthopnea and leg swelling.  Gastrointestinal: Negative for abdominal pain, blood in stool, constipation, diarrhea, heartburn, melena, nausea and vomiting.  Genitourinary: Negative for dysuria, frequency and urgency.  Musculoskeletal: Negative for back pain and joint pain.  Skin: Negative.  Negative for itching and rash.  Neurological: Negative for dizziness, tingling, focal weakness, weakness and headaches.  Endo/Heme/Allergies: Does not bruise/bleed easily.  Psychiatric/Behavioral: Negative for depression. The patient is not  nervous/anxious and does not have insomnia.      MEDICAL HISTORY:  Past Medical History:  Diagnosis Date  . Cyst, breast    benign  . GERD (gastroesophageal reflux disease)   . H/O: rheumatic fever   . 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 Hyperlipidemia   . Hypertension   . Menopause    at age 6    SURGICAL HISTORY: Past Surgical History:  Procedure Laterality Date  . APPENDECTOMY  2006  . BREAST CYST ASPIRATION Left 1999  . CHOLECYSTECTOMY  2006  . PORTACATH PLACEMENT Left 10/06/2019   Procedure: INSERTION PORT-A-CATH;  Surgeon: Robert Bellow, MD;  Location: [facility] ORS;  Service: General;  Laterality: Left;  . SUBMUCOSAL TATTOO INJECTION Right 10/06/2019   Procedure: Right axillary TATTOO INJECTION;  Surgeon: Robert Bellow, MD;  Location: [facility] ORS;  Service: General;  Laterality: Right;  Marland Kitchen VAGINAL DELIVERY     x3    SOCIAL HISTORY: Social History   Socioeconomic History  . Marital status: Widowed    Spouse name: Not on file  . Number of children: Not on file  . Years of education: Not on file  . Highest education level: Not on file  Occupational History  . Occupation: Glass blower/designer: [employer]  Tobacco Use  . Smoking status: Former Smoker    Years: 1.00    Types: Cigarettes    Quit date: 12/26/2005    Years since quitting: 13.7  . Smokeless tobacco: Never Used  . Tobacco comment: smoked for less than 1 years,  1 cig/day  Substance and Sexual Activity  . Alcohol use: No  . Drug use:  No  . Sexual activity: Never    Partners: Female  Other Topics Concern  . Not on file  Social History Narrative   Widowed, March 2014; lives with fiance.      [employer] ret[employer]; quit smoking 1ppw; no alcohol.       Social Determinants of Health   Financial Resource Strain:   . Difficulty of Paying Living Expenses:   Food Insecurity:   . Worried About Charity fundraiser in the Last Year:   . Arboriculturist  in the Last Year:   Transportation Needs:   . Film/video editor (Medical):   Marland Kitchen Lack of Transportation (Non-Medical):   Physical Activity:   . Days of Exercise per Week:   . Minutes of Exercise per Session:   Stress:   . Feeling of Stress :   Social Connections:   . Frequency of Communication with Friends and Family:   . Frequency of Social Gatherings with Friends and Family:   . Attends Religious Services:   . Active Member of Clubs or Organizations:   . Attends Archivist Meetings:   Marland Kitchen Marital Status:   Intimate Partner Violence:   . Fear of Current or Ex-Partner:   . Emotionally Abused:   Marland Kitchen Physically Abused:   . Sexually Abused:     FAMILY HISTORY: Family History  Problem Relation Age of Onset  . Cancer Mother 64       ovarian- died 4-5 years.   . Heart disease Mother 22  . Diabetes Mother   . Stroke Father 85  . Diabetes Father   . Diabetes Sister   . Melanoma Sister        survived  . Breast cancer Neg Hx     ALLERGIES:  has No Known Allergies.  MEDICATIONS:  Current Outpatient Medications  Medication Sig Dispense Refill  . APPLE CIDER VINEGAR PO Take 450 mg by mouth daily.    Marland Kitchen docusate sodium (COLACE) 100 MG capsule Take 100 mg by mouth daily.    . hydrochlorothiazide (MICROZIDE) 12.5 MG capsule Take 12.5 mg by mouth daily.    Marland Kitchen losartan (COZAAR) 50 MG tablet Take 50 mg by mouth daily.    Marland Kitchen lovastatin (MEVACOR) 40 MG tablet TAKE 1 TABLET BY MOUTH AT BEDTIME (Patient taking differently: Take 40 mg by mouth in the morning. ) 90 tablet 3  . metFORMIN (GLUCOPHAGE) 500 MG tablet Take 1 tablet (500 mg total) by mouth 2 (two) times daily with a meal. 180 tablet 3  . Multiple Vitamin (MULTIVITAMIN) tablet Take 1 tablet by mouth daily.      Marland Kitchen omeprazole (PRILOSEC) 20 MG capsule Take 1 capsule by mouth once daily (Patient taking differently: Take 20 mg by mouth daily. ) 90 capsule 0  . ondansetron (ZOFRAN) 8 MG tablet One pill every 8 hours as needed for  nausea/vomitting. 40 tablet 1  . prochlorperazine (COMPAZINE) 10 MG tablet Take 1 tablet (10 mg total) by mouth every 6 (six) hours as needed for nausea or vomiting. 40 tablet 1  . [START ON 10/17/2019] losartan-hydrochlorothiazide (HYZAAR) 50-12.5 MG tablet Take 1 tablet by mouth daily. (Patient not taking: Reported on 10/06/2019) 90 tablet 3   No current facility-administered medications for this visit.   Facility-Administered Medications Ordered in Other Visits  Medication Dose Route Frequency Provider Last Rate Last Admin  . CARBOplatin (PARAPLATIN) 510 mg in sodium chloride 0.9 % 250 mL chemo infusion  510 mg Intravenous Once Lura Falor R,  MD      . heparin lock flush 100 unit/mL  500 Units Intracatheter Once PRN Cammie Sickle, MD      . PACLitaxel (TAXOL) 162 mg in sodium chloride 0.9 % 250 mL chemo infusion (</= 80mg /m2)  80 mg/m2 (Treatment Plan Recorded) Intravenous Once Cammie Sickle, MD 277 mL/hr at 10/07/19 1245 162 mg at 10/07/19 1245      .  PHYSICAL EXAMINATION: ECOG PERFORMANCE STATUS: 0 - Asymptomatic  Vitals:   10/06/19 1543 10/07/19 1026  BP:  (!) 161/87  Pulse: 73 73  Resp:  20  Temp:  97.8 F (36.6 C)  SpO2: 98%    Filed Weights   10/06/19 1543  Weight: 194 lb 12.8 oz (88.4 kg)    Physical Exam HENT:     Head: Normocephalic and atraumatic.     Mouth/Throat:     Pharynx: No oropharyngeal exudate.  Eyes:     Pupils: Pupils are equal, round, and reactive to light.  Cardiovascular:     Rate and Rhythm: Normal rate and regular rhythm.  Pulmonary:     Effort: No respiratory distress.     Breath sounds: No wheezing.  Abdominal:     General: Bowel sounds are normal. There is no distension.     Palpations: Abdomen is soft. There is no mass.     Tenderness: There is no abdominal tenderness. There is no guarding or rebound.  Musculoskeletal:        General: No tenderness. Normal range of motion.     Cervical back: Normal range of  motion and neck supple.  Skin:    General: Skin is warm.     Comments:    Neurological:     Mental Status: She is alert and oriented to person, place, and time.  Psychiatric:        Mood and Affect: Affect normal.      LABORATORY DATA:  I have reviewed the data as listed Lab Results  Component Value Date   WBC 6.5 10/07/2019   HGB 13.3 10/07/2019   HCT 39.1 10/07/2019   MCV 83.7 10/07/2019   PLT 329 10/07/2019   Recent Labs    08/26/19 0951 09/25/19 1215 10/07/19 0855  NA 139 142 139  K 3.8 4.4 3.9  CL 101 102 105  CO2 27 28 26   GLUCOSE 130* 127* 159*  BUN 16 15 12   CREATININE 0.77 0.94 0.81  CALCIUM 9.8 9.8 8.8*  GFRNONAA  --  >60 >60  GFRAA  --  >60 >60  PROT 7.5 8.2* 7.4  ALBUMIN 4.4 4.4 3.9  AST 31 36 38  ALT 39* 42 46*  ALKPHOS 74 79 70  BILITOT 0.6 0.8 0.5    RADIOGRAPHIC STUDIES: I have personally reviewed the radiological images as listed and agreed with the findings in the report. NM Cardiac Muga Rest  Result Date: 10/02/2019 CLINICAL DATA:  RIGHT breast cancer, pre cardiotoxic chemotherapy EXAM: NUCLEAR MEDICINE CARDIAC BLOOD POOL IMAGING (MUGA) TECHNIQUE: Cardiac multi-gated acquisition was performed at rest following intravenous injection of Tc-36m labeled red blood cells. RADIOPHARMACEUTICALS:  24.07 mCi Tc-71m pertechnetate in-vitro labeled red blood cells IV COMPARISON:  None FINDINGS: Calculated LEFT ventricular ejection fraction is 62.6%, normal. Study was obtained at a cardiac rate of 70 bpm. Patient was rhythmic during imaging. Cine analysis of the LEFT ventricle in 3 projections demonstrates normal LV wall motion. IMPRESSION: Normal LEFT ventricular ejection fraction of 62.6%. Normal LV wall motion. Electronically Signed   By:  Lavonia Dana M.D.   On: 10/02/2019 14:03   NM PET Image Initial (PI) Skull Base To Thigh  Result Date: 10/04/2019 CLINICAL DATA:  Initial treatment strategy for breast cancer. EXAM: NUCLEAR MEDICINE PET SKULL BASE TO THIGH  TECHNIQUE: 10.29 mCi F-18 FDG was injected intravenously. Full-ring PET imaging was performed from the skull base to thigh after the radiotracer. CT data was obtained and used for attenuation correction and anatomic localization. Fasting blood glucose: 124 mg/dl COMPARISON:  None. FINDINGS: Mediastinal blood pool activity: SUV max 2.20 Liver activity: SUV max NA NECK: No hypermetabolic lymph nodes in the neck. Mild diffuse uptake in the thyroid gland typically seen with thyroiditis. No discrete nodules are identified. Incidental CT findings: none CHEST: The right lateral breast lesion containing a biopsy clip measures 3 cm. SUV max is 7.04 and consistent with known breast cancer. There is a single right axillary lymph node measuring 7.7 mm. SUV max is 5.22 consistent with a metastatic lymph node. Small calcification or small biopsy clip is also noted. No supraclavicular adenopathy. 8 mm AP window node on image 82/3 has an SUV max of 3.87. There is also a 7 mm precarinal lymph node with SUV max of 2.81. I think these are most likely inflammatory and unlikely metastatic disease but attention on future studies is suggested. No worrisome pulmonary nodules to suggest pulmonary metastatic disease. Incidental CT findings: Scattered vascular calcifications. No aneurysm. Small hiatal hernia. ABDOMEN/PELVIS: No abnormal hypermetabolic activity within the liver, pancreas, adrenal glands, or spleen. No hypermetabolic lymph nodes in the abdomen or pelvis. Incidental CT findings: Diffuse fatty infiltration of the liver is noted. The gallbladder is surgically absent. Scattered aortic calcifications but no aneurysm. SKELETON: No focal hypermetabolic activity to suggest skeletal metastasis. Incidental CT findings: none IMPRESSION: 1. Right lateral breast lesion is hypermetabolic and consistent with known breast cancer. There is a single hypermetabolic metastatic right axillary lymph node. 2. A few small mediastinal lymph nodes are  indeterminate but more likely inflammatory than neoplastic. Attention on future studies is suggested. 3. No evidence of pulmonary, abdominal/pelvic or osseous metastatic disease. Electronically Signed   By: Marijo Sanes M.D.   On: 10/04/2019 18:13   DG Chest Port 1 View  Result Date: 10/06/2019 CLINICAL DATA:  Port placement. EXAM: PORTABLE CHEST 1 VIEW COMPARISON:  12/02/2010 FINDINGS: 0944 hours. Left Port-A-Cath is new in the interval. Port-A-Cath tip overlies the expected location of the distal SVC. No evidence for pneumothorax or pleural effusion. Cardiopericardial silhouette is at upper limits of normal for size. The visualized bony structures of the thorax are intact. Telemetry leads overlie the chest. IMPRESSION: Left Port-A-Cath tip positioned over the expected location of the distal SVC. No evidence for pneumothorax or pleural effusion. Electronically Signed   By: Misty Stanley M.D.   On: 10/06/2019 09:50   DG C-Arm 1-60 Min-No Report  Result Date: 10/06/2019 Fluoroscopy was utilized by the requesting physician.  No radiographic interpretation.   US BREAST LTD UNI RIGHT INC AXILLA  Result Date: 09/18/2019 CLINICAL DATA:  For the past 3 weeks, the patient has felt a mass in the upper-outer quadrant of the right breast as well as shooting pain extending from the axilla to the nipple on the right. EXAM: DIGITAL DIAGNOSTIC BILATERAL MAMMOGRAM WITH CAD AND TOMO ULTRASOUND RIGHT BREAST COMPARISON:  Previous exam(s). ACR Breast Density Category c: The breast tissue is heterogeneously dense, which may obscure small masses. FINDINGS: There is an irregular mass ill-defined margins in the posterior aspect of  the upper-outer quadrant of the right breast, corresponding to the mass felt by the patient, marked with a metallic marker. There is also a single right axillary lymph node which is slightly larger and denser than the other nodes. No findings elsewhere in either breast suspicious for malignancy.  Mammographic images were processed with CAD. On physical exam, the patient has an approximately 3 cm oval, firm, palpable mass in the 10:30 o'clock position of the right breast, 8 cm from the nipple. There are no palpable right axillary lymph nodes. Targeted ultrasound is performed, showing an irregular hypoechoic mass with ill-defined surrounding increased echogenicity in the 10:30 o'clock position of the right breast, 8 cm from the nipple. This measures approximately 2.9 x 2.5 x 2.3 cm in maximum dimensions. Ultrasound of the right axilla demonstrates a single right axillary lymph node with focal eccentric cortical thickening measuring 5.4 mm in maximum thickness. IMPRESSION: 1. 2.9 cm palpable mass in the 10:30 o'clock position of the right breast with imaging features highly suspicious for malignancy. 2. Single right axillary lymph node with focal eccentric cortical thickening suspicious for a metastatic lymph node. RECOMMENDATION: Ultrasound-guided core needle biopsy of the 2.9 cm mass in the 10:30 o'clock position of the right breast and ultrasound-guided core needle biopsy of the abnormal right axillary lymph node. This has been discussed with the patient and will be scheduled in consultation with her referring provider. I have discussed the findings and recommendations with the patient. If applicable, a reminder letter will be sent to the patient regarding the next appointment. BI-RADS CATEGORY  5: Highly suggestive of malignancy. Electronically Signed   By: Claudie Revering M.D.   On: 09/18/2019 11:15   MM DIAG BREAST TOMO BILATERAL  Result Date: 09/18/2019 CLINICAL DATA:  For the past 3 weeks, the patient has felt a mass in the upper-outer quadrant of the right breast as well as shooting pain extending from the axilla to the nipple on the right. EXAM: DIGITAL DIAGNOSTIC BILATERAL MAMMOGRAM WITH CAD AND TOMO ULTRASOUND RIGHT BREAST COMPARISON:  Previous exam(s). ACR Breast Density Category c: The breast  tissue is heterogeneously dense, which may obscure small masses. FINDINGS: There is an irregular mass ill-defined margins in the posterior aspect of the upper-outer quadrant of the right breast, corresponding to the mass felt by the patient, marked with a metallic marker. There is also a single right axillary lymph node which is slightly larger and denser than the other nodes. No findings elsewhere in either breast suspicious for malignancy. Mammographic images were processed with CAD. On physical exam, the patient has an approximately 3 cm oval, firm, palpable mass in the 10:30 o'clock position of the right breast, 8 cm from the nipple. There are no palpable right axillary lymph nodes. Targeted ultrasound is performed, showing an irregular hypoechoic mass with ill-defined surrounding increased echogenicity in the 10:30 o'clock position of the right breast, 8 cm from the nipple. This measures approximately 2.9 x 2.5 x 2.3 cm in maximum dimensions. Ultrasound of the right axilla demonstrates a single right axillary lymph node with focal eccentric cortical thickening measuring 5.4 mm in maximum thickness. IMPRESSION: 1. 2.9 cm palpable mass in the 10:30 o'clock position of the right breast with imaging features highly suspicious for malignancy. 2. Single right axillary lymph node with focal eccentric cortical thickening suspicious for a metastatic lymph node. RECOMMENDATION: Ultrasound-guided core needle biopsy of the 2.9 cm mass in the 10:30 o'clock position of the right breast and ultrasound-guided core needle biopsy of the  abnormal right axillary lymph node. This has been discussed with the patient and will be scheduled in consultation with her referring provider. I have discussed the findings and recommendations with the patient. If applicable, a reminder letter will be sent to the patient regarding the next appointment. BI-RADS CATEGORY  5: Highly suggestive of malignancy. Electronically Signed   By: Claudie Revering  M.D.   On: 09/18/2019 11:15    ASSESSMENT & PLAN:   Carcinoma of upper-inner quadrant of right breast in female, estrogen receptor negative ([place]) # Right breast cancer-Stage IIB- T2N1-TRIPLE NEGATIVE; PET July 11th-right breast mass right underarm lymphadenopathy.  No distant metastatic disease. MUGA scan- 62%.   # Proceed carb-Taxol Labs today reviewed;  acceptable for treatment today.   Discussed the potential side effects including but not limited to-increasing fatigue, nausea vomiting, diarrhea, hair loss, sores in the mouth, increase risk of infection and also neuropathy.   # # Genetic counseling-   Patient meets the NCCN criteria for genetic testing  [mother with ovarian cancer Sister with melanoma].will refer to genetics.   #Clinical trial: Enrolled in neuropathy swab trial.  Post chemotherapy patient based on final pathology could be candidate for [facility] based clinical trial.  # DISPOSITION: # chemo today # 1 week- labs- cbc/bmp;Taxol # 2 week- labs- cbc/bmp;Taxol # 3 week- labs- cbc/cmp;Taxol-Carbo-Dr.B  # I reviewed the blood work- with the patient in detail; also reviewed the imaging independently [as summarized above]; and with the patient in detail.     All questions were answered. The patient/family knows to call the clinic with any problems, questions or concerns.   Cammie Sickle, MD 10/07/2019 1:08 PM

## 2019-10-07 NOTE — Anesthesia Postprocedure Evaluation (Signed)
Anesthesia Post Note  Patient: Ashlee Cox  Procedure(s) Performed: INSERTION PORT-A-CATH (Left ) Right axillary TATTOO INJECTION (Right )  Patient location during evaluation: PACU Anesthesia Type: General Level of consciousness: awake and alert and oriented Pain management: pain level controlled Vital Signs Assessment: post-procedure vital signs reviewed and stable Respiratory status: spontaneous breathing Cardiovascular status: blood pressure returned to baseline Anesthetic complications: no   No complications documented.   Last Vitals:  Vitals:   10/06/19 1011 10/06/19 1025  BP: (!) 153/75 (!) 151/94  Pulse: (!) 59 63  Resp: 18 18  Temp:    SpO2: 99% 99%    Last Pain:  Vitals:   10/06/19 1025  TempSrc:   PainSc: 1                  Anny Sayler

## 2019-10-07 NOTE — Research (Addendum)
SWOG R3202 Consent and Baseline Visit:    Met with Ashlee Cox and her husband [name] this morning prior to her scheduled appointments for labs, physician and infusion today to review the consent to participate in the [facility] S1714 peripheral neuropathy study. The informed Consent form / Hipaa for the [phone] protocol version dated 09/05/2019 and DCP-001 protocol consent version dated 09/17/2018 were reviewed with the patient and her spouse. Specific explanation was provided  including potential risks, benefits, alternate treatments, and side effects. The treatment schedule and all  Study procedures were  reviewed with the patient and spouse, including time points for visits and optional / required labs. The patient was informed that her participation is voluntary, she will not be paid to participate and she can withdraw from the protocol at any time. The patient and spouses questions were answered to their voiced satisfaction. She states that she is willing to participate in the protocol to help others in the future. She signed the ICF for [id number] and DCP-001 as well as the Hipaa for both protocols after review.  The patient was in agreement to participate in the optional lab studies for the S1714 protocol as well.The patient was provided with a copy of her signed ICF / Hipaa  forms for [phone] and DCP-001 with contact information for the research RN's here at [facility]. Research RN escorted the patient with her spouse to the laboratory waiting area prior to her scheduled  appointment. Ashlee Cox, CRA notified that the optional labs would need to be obtained for the protocol as well as the mandatory. The patient is aware that the research RN will meet with her and the provider for that visit and perform the protocol required neuropathy assessments.  Approximately 45 minutes was spent with the patient and spouse.  Jeral Fruit, RN, BSN, OCN 10/07/2019 0900 am  Patient was escorted to infusion room # 10 by  Jonne Ply RN who gave report to [facility], RN. The infusion RN was provided with the S1714 Infusion Room Sign and instruction provided for obtaining the labs required prior to the completion of the Taxol. Research RN phone number was included for contact on the infusion sign. The patient was provided with the protocol required questionnaires for her to complete while receiving her treatment.  Jeral Fruit, RN, BSN, OCN 10/07/2019  1030 am  Central protocol labs were drawn from right [facility] peripherally by [place], RN, infusion nurse at 1425 pm without any difficulty x 1 attempt. Six tubes drawn for study; 2 pink, 2 red, and 3 small green top. The pink and green top tubes were put in ice immediately and all tubes were taken to the lab for processing and shipment in the AM per protocol requirements. The Taxol infusion was completed at 1435 pm per infusion room research sign documentation. Jeral Fruit, RN, BSN, OCN 10/07/2019 1449 pm

## 2019-10-09 ENCOUNTER — Encounter: Payer: Self-pay | Admitting: *Deleted

## 2019-10-09 NOTE — Progress Notes (Addendum)
Called patient post first chemo to follow up.  No answer.  Left message to return my call.  Patient returned my call. States she is doing well after her chemo.  Denies nausea, vomiting or diarrhea.  No needs at this time.

## 2019-10-14 ENCOUNTER — Other Ambulatory Visit: Payer: Self-pay

## 2019-10-14 ENCOUNTER — Inpatient Hospital Stay: Payer: PPO

## 2019-10-14 VITALS — BP 133/83 | HR 78 | Temp 98.1°F | Resp 18 | Wt 191.8 lb

## 2019-10-14 DIAGNOSIS — Z95828 Presence of other vascular implants and grafts: Secondary | ICD-10-CM

## 2019-10-14 DIAGNOSIS — Z5111 Encounter for antineoplastic chemotherapy: Secondary | ICD-10-CM | POA: Diagnosis not present

## 2019-10-14 DIAGNOSIS — C50211 Malignant neoplasm of upper-inner quadrant of right female breast: Secondary | ICD-10-CM

## 2019-10-14 DIAGNOSIS — Z171 Estrogen receptor negative status [ER-]: Secondary | ICD-10-CM

## 2019-10-14 DIAGNOSIS — D4861 Neoplasm of uncertain behavior of right breast: Secondary | ICD-10-CM

## 2019-10-14 LAB — CBC WITH DIFFERENTIAL/PLATELET
Abs Immature Granulocytes: 0.03 10*3/uL (ref 0.00–0.07)
Basophils Absolute: 0.1 10*3/uL (ref 0.0–0.1)
Basophils Relative: 1 %
Eosinophils Absolute: 0.2 10*3/uL (ref 0.0–0.5)
Eosinophils Relative: 4 %
HCT: 36.8 % (ref 36.0–46.0)
Hemoglobin: 12.6 g/dL (ref 12.0–15.0)
Immature Granulocytes: 1 %
Lymphocytes Relative: 38 %
Lymphs Abs: 2 10*3/uL (ref 0.7–4.0)
MCH: 28.6 pg (ref 26.0–34.0)
MCHC: 34.2 g/dL (ref 30.0–36.0)
MCV: 83.6 fL (ref 80.0–100.0)
Monocytes Absolute: 0.2 10*3/uL (ref 0.1–1.0)
Monocytes Relative: 4 %
Neutro Abs: 2.7 10*3/uL (ref 1.7–7.7)
Neutrophils Relative %: 52 %
Platelets: 248 10*3/uL (ref 150–400)
RBC: 4.4 MIL/uL (ref 3.87–5.11)
RDW: 12.6 % (ref 11.5–15.5)
WBC: 5.2 10*3/uL (ref 4.0–10.5)
nRBC: 0 % (ref 0.0–0.2)

## 2019-10-14 LAB — BASIC METABOLIC PANEL
Anion gap: 10 (ref 5–15)
BUN: 19 mg/dL (ref 8–23)
CO2: 26 mmol/L (ref 22–32)
Calcium: 9.1 mg/dL (ref 8.9–10.3)
Chloride: 103 mmol/L (ref 98–111)
Creatinine, Ser: 0.73 mg/dL (ref 0.44–1.00)
GFR calc Af Amer: >60 mL/min (ref >60)
GFR calc non Af Amer: >60 mL/min (ref >60)
Glucose, Bld: 173 mg/dL — ABNORMAL HIGH (ref 70–99)
Potassium: 3.6 mmol/L (ref 3.5–5.1)
Sodium: 139 mmol/L (ref 135–145)

## 2019-10-14 MED ORDER — SODIUM CHLORIDE 0.9 % IV SOLN
80.0000 mg/m2 | Freq: Once | INTRAVENOUS | Status: AC
  Administered 2019-10-14: 162 mg via INTRAVENOUS
  Filled 2019-10-14: qty 27

## 2019-10-14 MED ORDER — FAMOTIDINE IN NACL 20-0.9 MG/50ML-% IV SOLN
20.0000 mg | Freq: Once | INTRAVENOUS | Status: AC
  Administered 2019-10-14: 20 mg via INTRAVENOUS
  Filled 2019-10-14: qty 50

## 2019-10-14 MED ORDER — SODIUM CHLORIDE 0.9 % IV SOLN
Freq: Once | INTRAVENOUS | Status: AC
  Filled 2019-10-14: qty 250

## 2019-10-14 MED ORDER — SODIUM CHLORIDE 0.9 % IV SOLN
20.0000 mg | Freq: Once | INTRAVENOUS | Status: AC
  Administered 2019-10-14: 20 mg via INTRAVENOUS
  Filled 2019-10-14: qty 20

## 2019-10-14 MED ORDER — SODIUM CHLORIDE 0.9% FLUSH
10.0000 mL | INTRAVENOUS | Status: DC | PRN
  Administered 2019-10-14: 10 mL via INTRAVENOUS
  Filled 2019-10-14: qty 10

## 2019-10-14 MED ORDER — HEPARIN SOD (PORK) LOCK FLUSH 100 UNIT/ML IV SOLN
500.0000 [IU] | Freq: Once | INTRAVENOUS | Status: AC | PRN
  Administered 2019-10-14: 500 [IU]
  Filled 2019-10-14: qty 5

## 2019-10-14 MED ORDER — DIPHENHYDRAMINE HCL 50 MG/ML IJ SOLN
50.0000 mg | Freq: Once | INTRAMUSCULAR | Status: AC
  Administered 2019-10-14: 50 mg via INTRAVENOUS
  Filled 2019-10-14: qty 1

## 2019-10-14 NOTE — Progress Notes (Signed)
Treatment plan has parameters for CMP, BMP ordered.  Ok per Dr. Rogue Bussing to proceed with BMP only.

## 2019-10-20 ENCOUNTER — Other Ambulatory Visit: Payer: Self-pay | Admitting: Internal Medicine

## 2019-10-21 ENCOUNTER — Other Ambulatory Visit: Payer: Self-pay

## 2019-10-21 ENCOUNTER — Inpatient Hospital Stay: Payer: PPO

## 2019-10-21 VITALS — BP 152/74 | HR 73 | Temp 96.8°F | Resp 18 | Wt 194.4 lb

## 2019-10-21 DIAGNOSIS — D4861 Neoplasm of uncertain behavior of right breast: Secondary | ICD-10-CM

## 2019-10-21 DIAGNOSIS — Z5111 Encounter for antineoplastic chemotherapy: Secondary | ICD-10-CM | POA: Diagnosis not present

## 2019-10-21 DIAGNOSIS — C50211 Malignant neoplasm of upper-inner quadrant of right female breast: Secondary | ICD-10-CM

## 2019-10-21 LAB — CBC WITH DIFFERENTIAL/PLATELET
Abs Immature Granulocytes: 0.07 10*3/uL (ref 0.00–0.07)
Basophils Absolute: 0.1 10*3/uL (ref 0.0–0.1)
Basophils Relative: 2 %
Eosinophils Absolute: 0.2 10*3/uL (ref 0.0–0.5)
Eosinophils Relative: 3 %
HCT: 35.5 % — ABNORMAL LOW (ref 36.0–46.0)
Hemoglobin: 12 g/dL (ref 12.0–15.0)
Immature Granulocytes: 2 %
Lymphocytes Relative: 42 %
Lymphs Abs: 2 10*3/uL (ref 0.7–4.0)
MCH: 28.7 pg (ref 26.0–34.0)
MCHC: 33.8 g/dL (ref 30.0–36.0)
MCV: 84.9 fL (ref 80.0–100.0)
Monocytes Absolute: 0.4 10*3/uL (ref 0.1–1.0)
Monocytes Relative: 7 %
Neutro Abs: 2.1 10*3/uL (ref 1.7–7.7)
Neutrophils Relative %: 44 %
Platelets: 285 10*3/uL (ref 150–400)
RBC: 4.18 MIL/uL (ref 3.87–5.11)
RDW: 13.2 % (ref 11.5–15.5)
WBC: 4.7 10*3/uL (ref 4.0–10.5)
nRBC: 0 % (ref 0.0–0.2)

## 2019-10-21 LAB — BASIC METABOLIC PANEL
Anion gap: 10 (ref 5–15)
BUN: 15 mg/dL (ref 8–23)
CO2: 25 mmol/L (ref 22–32)
Calcium: 9.1 mg/dL (ref 8.9–10.3)
Chloride: 104 mmol/L (ref 98–111)
Creatinine, Ser: 0.76 mg/dL (ref 0.44–1.00)
GFR calc Af Amer: >60 mL/min (ref >60)
GFR calc non Af Amer: >60 mL/min (ref >60)
Glucose, Bld: 202 mg/dL — ABNORMAL HIGH (ref 70–99)
Potassium: 3.3 mmol/L — ABNORMAL LOW (ref 3.5–5.1)
Sodium: 139 mmol/L (ref 135–145)

## 2019-10-21 MED ORDER — SODIUM CHLORIDE 0.9 % IV SOLN
20.0000 mg | Freq: Once | INTRAVENOUS | Status: AC
  Administered 2019-10-21: 20 mg via INTRAVENOUS
  Filled 2019-10-21: qty 20

## 2019-10-21 MED ORDER — FAMOTIDINE IN NACL 20-0.9 MG/50ML-% IV SOLN
20.0000 mg | Freq: Once | INTRAVENOUS | Status: AC
  Administered 2019-10-21: 20 mg via INTRAVENOUS
  Filled 2019-10-21: qty 50

## 2019-10-21 MED ORDER — HEPARIN SOD (PORK) LOCK FLUSH 100 UNIT/ML IV SOLN
INTRAVENOUS | Status: AC
  Filled 2019-10-21: qty 5

## 2019-10-21 MED ORDER — DIPHENHYDRAMINE HCL 50 MG/ML IJ SOLN
50.0000 mg | Freq: Once | INTRAMUSCULAR | Status: AC
  Administered 2019-10-21: 50 mg via INTRAVENOUS
  Filled 2019-10-21: qty 1

## 2019-10-21 MED ORDER — HEPARIN SOD (PORK) LOCK FLUSH 100 UNIT/ML IV SOLN
500.0000 [IU] | Freq: Once | INTRAVENOUS | Status: AC | PRN
  Administered 2019-10-21: 500 [IU]
  Filled 2019-10-21: qty 5

## 2019-10-21 MED ORDER — SODIUM CHLORIDE 0.9 % IV SOLN
80.0000 mg/m2 | Freq: Once | INTRAVENOUS | Status: AC
  Administered 2019-10-21: 162 mg via INTRAVENOUS
  Filled 2019-10-21: qty 27

## 2019-10-21 MED ORDER — SODIUM CHLORIDE 0.9 % IV SOLN
Freq: Once | INTRAVENOUS | Status: AC
  Filled 2019-10-21: qty 250

## 2019-10-21 NOTE — Progress Notes (Signed)
Labs, CBC with Differential and Basic Metabolic Panel, drawn today. MD, Dr. Rogue Bussing, notified and aware. Per MD order: Comprehensive Metabolic Panel does not need to be drawn today; reference CBC with Differential and Basic Metabolic Panel results and proceed with scheduled Taxol treatment at this time.

## 2019-10-22 ENCOUNTER — Telehealth: Payer: Self-pay | Admitting: *Deleted

## 2019-10-22 NOTE — Telephone Encounter (Signed)
Per md - will send orders to pathology for PDL - 1;

## 2019-10-23 ENCOUNTER — Encounter: Payer: Self-pay | Admitting: *Deleted

## 2019-10-23 NOTE — Progress Notes (Signed)
Called to follow up with patient post chemotherapy.  Left message for her to give me a call.  Patient returned my call and left me a message that she was tolerating treatments well.  No needs at this time.

## 2019-10-23 NOTE — Telephone Encounter (Signed)
Faxed pathology order to [facility]

## 2019-10-24 DIAGNOSIS — C50411 Malignant neoplasm of upper-outer quadrant of right female breast: Secondary | ICD-10-CM | POA: Diagnosis not present

## 2019-10-24 DIAGNOSIS — C773 Secondary and unspecified malignant neoplasm of axilla and upper limb lymph nodes: Secondary | ICD-10-CM | POA: Diagnosis not present

## 2019-10-27 ENCOUNTER — Other Ambulatory Visit: Payer: Self-pay

## 2019-10-27 DIAGNOSIS — C50211 Malignant neoplasm of upper-inner quadrant of right female breast: Secondary | ICD-10-CM

## 2019-10-27 DIAGNOSIS — Z171 Estrogen receptor negative status [ER-]: Secondary | ICD-10-CM

## 2019-10-28 ENCOUNTER — Inpatient Hospital Stay: Payer: PPO | Admitting: Internal Medicine

## 2019-10-28 ENCOUNTER — Inpatient Hospital Stay: Payer: PPO | Attending: Internal Medicine

## 2019-10-28 ENCOUNTER — Inpatient Hospital Stay: Payer: PPO

## 2019-10-28 DIAGNOSIS — Z8041 Family history of malignant neoplasm of ovary: Secondary | ICD-10-CM | POA: Insufficient documentation

## 2019-10-28 DIAGNOSIS — C50211 Malignant neoplasm of upper-inner quadrant of right female breast: Secondary | ICD-10-CM

## 2019-10-28 DIAGNOSIS — Z5111 Encounter for antineoplastic chemotherapy: Secondary | ICD-10-CM | POA: Diagnosis not present

## 2019-10-28 DIAGNOSIS — Z808 Family history of malignant neoplasm of other organs or systems: Secondary | ICD-10-CM | POA: Insufficient documentation

## 2019-10-28 DIAGNOSIS — E119 Type 2 diabetes mellitus without complications: Secondary | ICD-10-CM | POA: Diagnosis not present

## 2019-10-28 DIAGNOSIS — Z87891 Personal history of nicotine dependence: Secondary | ICD-10-CM | POA: Insufficient documentation

## 2019-10-28 DIAGNOSIS — Z171 Estrogen receptor negative status [ER-]: Secondary | ICD-10-CM | POA: Diagnosis not present

## 2019-10-28 DIAGNOSIS — Z7984 Long term (current) use of oral hypoglycemic drugs: Secondary | ICD-10-CM | POA: Insufficient documentation

## 2019-10-28 DIAGNOSIS — Z006 Encounter for examination for normal comparison and control in clinical research program: Secondary | ICD-10-CM

## 2019-10-28 DIAGNOSIS — D4861 Neoplasm of uncertain behavior of right breast: Secondary | ICD-10-CM

## 2019-10-28 LAB — CBC WITH DIFFERENTIAL/PLATELET
Abs Immature Granulocytes: 0.1 10*3/uL — ABNORMAL HIGH (ref 0.00–0.07)
Basophils Absolute: 0.1 10*3/uL (ref 0.0–0.1)
Basophils Relative: 2 %
Eosinophils Absolute: 0.2 10*3/uL (ref 0.0–0.5)
Eosinophils Relative: 3 %
HCT: 36.3 % (ref 36.0–46.0)
Hemoglobin: 12.5 g/dL (ref 12.0–15.0)
Immature Granulocytes: 2 %
Lymphocytes Relative: 33 %
Lymphs Abs: 1.8 10*3/uL (ref 0.7–4.0)
MCH: 29.1 pg (ref 26.0–34.0)
MCHC: 34.4 g/dL (ref 30.0–36.0)
MCV: 84.4 fL (ref 80.0–100.0)
Monocytes Absolute: 0.4 10*3/uL (ref 0.1–1.0)
Monocytes Relative: 7 %
Neutro Abs: 2.9 10*3/uL (ref 1.7–7.7)
Neutrophils Relative %: 53 %
Platelets: 302 10*3/uL (ref 150–400)
RBC: 4.3 MIL/uL (ref 3.87–5.11)
RDW: 14 % (ref 11.5–15.5)
WBC: 5.5 10*3/uL (ref 4.0–10.5)
nRBC: 0 % (ref 0.0–0.2)

## 2019-10-28 LAB — COMPREHENSIVE METABOLIC PANEL
ALT: 43 U/L (ref 0–44)
AST: 43 U/L — ABNORMAL HIGH (ref 15–41)
Albumin: 4 g/dL (ref 3.5–5.0)
Alkaline Phosphatase: 67 U/L (ref 38–126)
Anion gap: 13 (ref 5–15)
BUN: 18 mg/dL (ref 8–23)
CO2: 23 mmol/L (ref 22–32)
Calcium: 9.3 mg/dL (ref 8.9–10.3)
Chloride: 101 mmol/L (ref 98–111)
Creatinine, Ser: 0.91 mg/dL (ref 0.44–1.00)
GFR calc Af Amer: >60 mL/min (ref >60)
GFR calc non Af Amer: >60 mL/min (ref >60)
Glucose, Bld: 239 mg/dL — ABNORMAL HIGH (ref 70–99)
Potassium: 3.4 mmol/L — ABNORMAL LOW (ref 3.5–5.1)
Sodium: 137 mmol/L (ref 135–145)
Total Bilirubin: 0.7 mg/dL (ref 0.3–1.2)
Total Protein: 6.9 g/dL (ref 6.5–8.1)

## 2019-10-28 MED ORDER — SODIUM CHLORIDE 0.9 % IV SOLN
150.0000 mg | Freq: Once | INTRAVENOUS | Status: AC
  Administered 2019-10-28: 150 mg via INTRAVENOUS
  Filled 2019-10-28: qty 5

## 2019-10-28 MED ORDER — SODIUM CHLORIDE 0.9 % IV SOLN
505.5000 mg | Freq: Once | INTRAVENOUS | Status: AC
  Administered 2019-10-28: 510 mg via INTRAVENOUS
  Filled 2019-10-28: qty 51

## 2019-10-28 MED ORDER — HEPARIN SOD (PORK) LOCK FLUSH 100 UNIT/ML IV SOLN
INTRAVENOUS | Status: AC
  Filled 2019-10-28: qty 5

## 2019-10-28 MED ORDER — SODIUM CHLORIDE 0.9% FLUSH
10.0000 mL | Freq: Once | INTRAVENOUS | Status: AC
  Administered 2019-10-28: 10 mL via INTRAVENOUS
  Filled 2019-10-28: qty 10

## 2019-10-28 MED ORDER — PALONOSETRON HCL INJECTION 0.25 MG/5ML
0.2500 mg | Freq: Once | INTRAVENOUS | Status: AC
  Administered 2019-10-28: 0.25 mg via INTRAVENOUS
  Filled 2019-10-28: qty 5

## 2019-10-28 MED ORDER — HEPARIN SOD (PORK) LOCK FLUSH 100 UNIT/ML IV SOLN
500.0000 [IU] | Freq: Once | INTRAVENOUS | Status: AC
  Administered 2019-10-28: 500 [IU] via INTRAVENOUS
  Filled 2019-10-28: qty 5

## 2019-10-28 MED ORDER — SODIUM CHLORIDE 0.9 % IV SOLN
Freq: Once | INTRAVENOUS | Status: AC
  Filled 2019-10-28: qty 250

## 2019-10-28 MED ORDER — SODIUM CHLORIDE 0.9 % IV SOLN
80.0000 mg/m2 | Freq: Once | INTRAVENOUS | Status: AC
  Administered 2019-10-28: 162 mg via INTRAVENOUS
  Filled 2019-10-28: qty 27

## 2019-10-28 MED ORDER — SODIUM CHLORIDE 0.9 % IV SOLN
10.0000 mg | Freq: Once | INTRAVENOUS | Status: AC
  Administered 2019-10-28: 10 mg via INTRAVENOUS
  Filled 2019-10-28: qty 10

## 2019-10-28 MED ORDER — FAMOTIDINE IN NACL 20-0.9 MG/50ML-% IV SOLN
20.0000 mg | Freq: Once | INTRAVENOUS | Status: AC
  Administered 2019-10-28: 20 mg via INTRAVENOUS
  Filled 2019-10-28: qty 50

## 2019-10-28 MED ORDER — DIPHENHYDRAMINE HCL 50 MG/ML IJ SOLN
50.0000 mg | Freq: Once | INTRAMUSCULAR | Status: AC
  Administered 2019-10-28: 50 mg via INTRAVENOUS
  Filled 2019-10-28: qty 1

## 2019-10-28 MED ORDER — METFORMIN HCL 500 MG PO TABS: 1000.0000 mg | ORAL_TABLET | Freq: Two times a day (BID) | ORAL | 3 refills | Status: DC

## 2019-10-28 NOTE — Assessment & Plan Note (Addendum)
#   Right breast cancer-Stage IIB- T2N1-TRIPLE NEGATIVE; PET July 11th-right breast mass right underarm lymphadenopathy.  No distant metastatic disease. MUGA scan- 62%.   # Proceed carb-Taxol Labs today reviewed;  acceptable for treatment today.    # DM-post breakfast blood sugars 239 fasting 140s to 150s as per patient.  Increase metformin 1000 mg BID; new refill given.  # # Genetic counseling-   Patient meets the NCCN criteria for genetic testing  [mother with ovarian cancer Sister with melanoma].will refer to genetics.   #Clinical trial: Enrolled in neuropathy swab trial discussed with clinical trials nurse.  # DISPOSITION: # chemo today # 1 week- labs- cbc/bmp;Taxol # 2 week- labs- cbc/bmp;Taxol # 3 week- MD; labs- cbc/cmp;Taxol-Carbo-Dr.B

## 2019-10-28 NOTE — Progress Notes (Signed)
[facility] NOTE  Patient Care Team: [name], [name] Jeral Fruit, RN as Registered Nurse  CHIEF COMPLAINTS/PURPOSE OF CONSULTATION: Breast cancer  #  Oncology History Overview Note  #Right breast cancer-T2N1; 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 Burnett.]   # [id number] Carbo-taxol-AC #1  # SURVIVORSHIP: Pending  # GENETICS: Pending  DIAGNOSIS: Right breast cancer triple negative  STAGE:   IIB      ;  GOALS: cure  CURRENT/MOST RECENT THERAPY : carbo-taxol-AC    Carcinoma of upper-inner quadrant of right breast in female, estrogen receptor negative ([place])  10/07/2019 Initial Diagnosis   Carcinoma of upper-inner quadrant of right breast in female, estrogen receptor negative ([place])      HISTORY OF PRESENTING ILLNESS:  Ashlee Cox 69 y.o.  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  Patient denies any significant fatigue.  Denies any nausea vomiting abdominal pain.  Denies any lumps or bumps.  Complains of mild tingling in the extremities.  This does not interrupt her daily lifestyle.  Otherwise denies any pain.  Review of Systems  Constitutional: Negative for chills, diaphoresis, fever, malaise/fatigue and weight loss.  HENT: Negative for nosebleeds and sore throat.   Eyes: Negative for double vision.  Respiratory: Negative for cough, hemoptysis, sputum production, shortness of breath and wheezing.   Cardiovascular: Negative for chest pain, palpitations, orthopnea and leg swelling.  Gastrointestinal: Negative for abdominal pain, blood in stool, constipation, diarrhea, heartburn, melena, nausea and vomiting.  Genitourinary: Negative for dysuria, frequency and urgency.  Musculoskeletal: Negative for back pain and joint pain.  Skin: Negative.  Negative for itching and rash.  Neurological: Negative for dizziness, tingling, focal  weakness, weakness and headaches.  Endo/Heme/Allergies: Does not bruise/bleed easily.  Psychiatric/Behavioral: Negative for depression. The patient is not nervous/anxious and does not have insomnia.      MEDICAL HISTORY:  Past Medical History:  Diagnosis Date  . Cyst, breast    benign  . GERD (gastroesophageal reflux disease)   . H/O: rheumatic fever   . 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 Hyperlipidemia   . Hypertension   . Menopause    at age 27    SURGICAL HISTORY: Past Surgical History:  Procedure Laterality Date  . APPENDECTOMY  2006  . BREAST CYST ASPIRATION Left 1999  . CHOLECYSTECTOMY  2006  . PORTACATH PLACEMENT Left 10/06/2019   Procedure: INSERTION PORT-A-CATH;  Surgeon: Robert Bellow, MD;  Location: [facility] ORS;  Service: General;  Laterality: Left;  . SUBMUCOSAL TATTOO INJECTION Right 10/06/2019   Procedure: Right axillary TATTOO INJECTION;  Surgeon: Robert Bellow, MD;  Location: [facility] ORS;  Service: General;  Laterality: Right;  Marland Kitchen VAGINAL DELIVERY     x3    SOCIAL HISTORY: Social History   Socioeconomic History  . Marital status: Widowed    Spouse name: Not on file  . Number of children: Not on file  . Years of education: Not on file  . Highest education level: Not on file  Occupational History  . Occupation: Glass blower/designer: [employer]  Tobacco Use  . Smoking status: Former Smoker    Years: 1.00    Types: Cigarettes    Quit date: 12/26/2005    Years since quitting: 13.8  . Smokeless tobacco: Never Used  . Tobacco comment: smoked for  less than 1 years,  1 cig/day  Substance and Sexual Activity  . Alcohol use: No  . Drug use: No  . Sexual activity: Never    Partners: Female  Other Topics Concern  . Not on file  Social History Narrative   Widowed, March 2014; lives with fiance.      [employer] retd; quit smoking 1ppw; no alcohol.       Social Determinants of Health   Financial  Resource Strain:   . Difficulty of Paying Living Expenses:   Food Insecurity:   . Worried About Charity fundraiser in the Last Year:   . Arboriculturist in the Last Year:   Transportation Needs:   . Film/video editor (Medical):   Marland Kitchen Lack of Transportation (Non-Medical):   Physical Activity:   . Days of Exercise per Week:   . Minutes of Exercise per Session:   Stress:   . Feeling of Stress :   Social Connections:   . Frequency of Communication with Friends and Family:   . Frequency of Social Gatherings with Friends and Family:   . Attends Religious Services:   . Active Member of Clubs or Organizations:   . Attends Archivist Meetings:   Marland Kitchen Marital Status:   Intimate Partner Violence:   . Fear of Current or Ex-Partner:   . Emotionally Abused:   Marland Kitchen Physically Abused:   . Sexually Abused:     FAMILY HISTORY: Family History  Problem Relation Age of Onset  . Cancer Mother 7       ovarian- died 4-5 years.   . Heart disease Mother 30  . Diabetes Mother   . Stroke Father 56  . Diabetes Father   . Diabetes Sister   . Melanoma Sister        survived  . Breast cancer Neg Hx     ALLERGIES:  has No Known Allergies.  MEDICATIONS:  Current Outpatient Medications  Medication Sig Dispense Refill  . losartan-hydrochlorothiazide (HYZAAR) 50-12.5 MG tablet Take 1 tablet by mouth daily. 90 tablet 3  . lovastatin (MEVACOR) 40 MG tablet TAKE 1 TABLET BY MOUTH AT BEDTIME (Patient taking differently: Take 40 mg by mouth in the morning. ) 90 tablet 3  . metFORMIN (GLUCOPHAGE) 500 MG tablet Take 2 tablets (1,000 mg total) by mouth 2 (two) times daily with a meal. 120 tablet 3  . Multiple Vitamin (MULTIVITAMIN) tablet Take 1 tablet by mouth daily.      Marland Kitchen omeprazole (PRILOSEC) 20 MG capsule Take 1 capsule by mouth once daily 90 capsule 0  . docusate sodium (COLACE) 100 MG capsule Take 100 mg by mouth daily. (Patient not taking: Reported on 10/27/2019)    . ondansetron (ZOFRAN) 8 MG  tablet One pill every 8 hours as needed for nausea/vomitting. (Patient not taking: Reported on 10/27/2019) 40 tablet 1  . prochlorperazine (COMPAZINE) 10 MG tablet Take 1 tablet (10 mg total) by mouth every 6 (six) hours as needed for nausea or vomiting. (Patient not taking: Reported on 10/27/2019) 40 tablet 1   No current facility-administered medications for this visit.   Facility-Administered Medications Ordered in Other Visits  Medication Dose Route Frequency Provider Last Rate Last Admin  . CARBOplatin (PARAPLATIN) 510 mg in sodium chloride 0.9 % 250 mL chemo infusion  510 mg Intravenous Once Charlaine Dalton R, MD      . PACLitaxel (TAXOL) 162 mg in sodium chloride 0.9 % 250 mL chemo infusion (</= 80mg /m2)  80  mg/m2 (Treatment Plan Recorded) Intravenous Once Cammie Sickle, MD 277 mL/hr at 10/28/19 1117 162 mg at 10/28/19 1117      .  PHYSICAL EXAMINATION: ECOG PERFORMANCE STATUS: 0 - Asymptomatic  Vitals:   10/28/19 0859  BP: 140/85  Pulse: 89  Resp: 16  Temp: 98.3 F (36.8 C)  SpO2: 97%   Filed Weights   10/28/19 0859  Weight: 192 lb 3.2 oz (87.2 kg)    Physical Exam HENT:     Head: Normocephalic and atraumatic.     Mouth/Throat:     Pharynx: No oropharyngeal exudate.  Eyes:     Pupils: Pupils are equal, round, and reactive to light.  Cardiovascular:     Rate and Rhythm: Normal rate and regular rhythm.  Pulmonary:     Effort: No respiratory distress.     Breath sounds: No wheezing.  Abdominal:     General: Bowel sounds are normal. There is no distension.     Palpations: Abdomen is soft. There is no mass.     Tenderness: There is no abdominal tenderness. There is no guarding or rebound.  Musculoskeletal:        General: No tenderness. Normal range of motion.     Cervical back: Normal range of motion and neck supple.  Skin:    General: Skin is warm.     Comments:    Neurological:     Mental Status: She is alert and oriented to person, place, and  time.  Psychiatric:        Mood and Affect: Affect normal.      LABORATORY DATA:  I have reviewed the data as listed Lab Results  Component Value Date   WBC 5.5 10/28/2019   HGB 12.5 10/28/2019   HCT 36.3 10/28/2019   MCV 84.4 10/28/2019   PLT 302 10/28/2019   Recent Labs    09/25/19 1215 09/25/19 1215 10/07/19 0855 10/07/19 0855 10/14/19 0819 10/21/19 0820 10/28/19 0838  NA 142   < > 139   < > 139 139 137  K 4.4   < > 3.9   < > 3.6 3.3* 3.4*  CL 102   < > 105   < > 103 104 101  CO2 28   < > 26   < > 26 25 23   GLUCOSE 127*   < > 159*   < > 173* 202* 239*  BUN 15   < > 12   < > 19 15 18   CREATININE 0.94   < > 0.81   < > 0.73 0.76 0.91  CALCIUM 9.8   < > 8.8*   < > 9.1 9.1 9.3  GFRNONAA >60   < > >60   < > >60 >60 >60  GFRAA >60   < > >60   < > >60 >60 >60  PROT 8.2*  --  7.4  --   --   --  6.9  ALBUMIN 4.4  --  3.9  --   --   --  4.0  AST 36  --  38  --   --   --  43*  ALT 42  --  46*  --   --   --  43  ALKPHOS 79  --  70  --   --   --  67  BILITOT 0.8  --  0.5  --   --   --  0.7   < > = values in this interval not displayed.  RADIOGRAPHIC STUDIES: I have personally reviewed the radiological images as listed and agreed with the findings in the report. NM Cardiac Muga Rest  Result Date: 10/02/2019 CLINICAL DATA:  RIGHT breast cancer, pre cardiotoxic chemotherapy EXAM: NUCLEAR MEDICINE CARDIAC BLOOD POOL IMAGING (MUGA) TECHNIQUE: Cardiac multi-gated acquisition was performed at rest following intravenous injection of Tc-85m labeled red blood cells. RADIOPHARMACEUTICALS:  24.07 mCi Tc-105m pertechnetate in-vitro labeled red blood cells IV COMPARISON:  None FINDINGS: Calculated LEFT ventricular ejection fraction is 62.6%, normal. Study was obtained at a cardiac rate of 70 bpm. Patient was rhythmic during imaging. Cine analysis of the LEFT ventricle in 3 projections demonstrates normal LV wall motion. IMPRESSION: Normal LEFT ventricular ejection fraction of 62.6%. Normal LV  wall motion. Electronically Signed   By: Lavonia Dana M.D.   On: 10/02/2019 14:03   NM PET Image Initial (PI) Skull Base To Thigh  Result Date: 10/04/2019 CLINICAL DATA:  Initial treatment strategy for breast cancer. EXAM: NUCLEAR MEDICINE PET SKULL BASE TO THIGH TECHNIQUE: 10.29 mCi F-18 FDG was injected intravenously. Full-ring PET imaging was performed from the skull base to thigh after the radiotracer. CT data was obtained and used for attenuation correction and anatomic localization. Fasting blood glucose: 124 mg/dl COMPARISON:  None. FINDINGS: Mediastinal blood pool activity: SUV max 2.20 Liver activity: SUV max NA NECK: No hypermetabolic lymph nodes in the neck. Mild diffuse uptake in the thyroid gland typically seen with thyroiditis. No discrete nodules are identified. Incidental CT findings: none CHEST: The right lateral breast lesion containing a biopsy clip measures 3 cm. SUV max is 7.04 and consistent with known breast cancer. There is a single right axillary lymph node measuring 7.7 mm. SUV max is 5.22 consistent with a metastatic lymph node. Small calcification or small biopsy clip is also noted. No supraclavicular adenopathy. 8 mm AP window node on image 82/3 has an SUV max of 3.87. There is also a 7 mm precarinal lymph node with SUV max of 2.81. I think these are most likely inflammatory and unlikely metastatic disease but attention on future studies is suggested. No worrisome pulmonary nodules to suggest pulmonary metastatic disease. Incidental CT findings: Scattered vascular calcifications. No aneurysm. Small hiatal hernia. ABDOMEN/PELVIS: No abnormal hypermetabolic activity within the liver, pancreas, adrenal glands, or spleen. No hypermetabolic lymph nodes in the abdomen or pelvis. Incidental CT findings: Diffuse fatty infiltration of the liver is noted. The gallbladder is surgically absent. Scattered aortic calcifications but no aneurysm. SKELETON: No focal hypermetabolic activity to suggest  skeletal metastasis. Incidental CT findings: none IMPRESSION: 1. Right lateral breast lesion is hypermetabolic and consistent with known breast cancer. There is a single hypermetabolic metastatic right axillary lymph node. 2. A few small mediastinal lymph nodes are indeterminate but more likely inflammatory than neoplastic. Attention on future studies is suggested. 3. No evidence of pulmonary, abdominal/pelvic or osseous metastatic disease. Electronically Signed   By: Marijo Sanes M.D.   On: 10/04/2019 18:13   DG Chest Port 1 View  Result Date: 10/06/2019 CLINICAL DATA:  Port placement. EXAM: PORTABLE CHEST 1 VIEW COMPARISON:  12/02/2010 FINDINGS: 0944 hours. Left Port-A-Cath is new in the interval. Port-A-Cath tip overlies the expected location of the distal SVC. No evidence for pneumothorax or pleural effusion. Cardiopericardial silhouette is at upper limits of normal for size. The visualized bony structures of the thorax are intact. Telemetry leads overlie the chest. IMPRESSION: Left Port-A-Cath tip positioned over the expected location of the distal SVC. No evidence for pneumothorax or pleural effusion.  Electronically Signed   By: Misty Stanley M.D.   On: 10/06/2019 09:50   DG C-Arm 1-60 Min-No Report  Result Date: 10/06/2019 Fluoroscopy was utilized by the requesting physician.  No radiographic interpretation.    ASSESSMENT & PLAN:   Carcinoma of upper-inner quadrant of right breast in female, estrogen receptor negative ([place]) # Right breast cancer-Stage IIB- T2N1-TRIPLE NEGATIVE; PET July 11th-right breast mass right underarm lymphadenopathy.  No distant metastatic disease. MUGA scan- 62%.   # Proceed carb-Taxol Labs today reviewed;  acceptable for treatment today.    # DM-post breakfast blood sugars 239 fasting 140s to 150s as per patient.  Increase metformin 1000 mg BID; new refill given.  # # Genetic counseling-   Patient meets the NCCN criteria for genetic testing  [mother with ovarian  cancer Sister with melanoma].will refer to genetics.   #Clinical trial: Enrolled in neuropathy swab trial discussed with clinical trials nurse.  # DISPOSITION: # chemo today # 1 week- labs- cbc/bmp;Taxol # 2 week- labs- cbc/bmp;Taxol # 3 week- MD; labs- cbc/cmp;Taxol-Carbo-Dr.B     All questions were answered. The patient/family knows to call the clinic with any problems, questions or concerns.   Cammie Sickle, MD 10/28/2019 12:13 PM

## 2019-10-28 NOTE — Research (Signed)
SWOG [id number] Protocol Week 4 Visit:   Patient in to the cancer center this am with her husband for her appointment. Central labs drawn from IVAD in the infusion room at 0843 am per protocol requirements, vials on ice and delivered to the lab for processing. Patient states she does feel some tingling in her hands and feet now. Dr. Rogue Bussing examined the patient and completed the physician reported form and solicited neuropathy events form with no adverse events being reported and symptom burden as a "0". 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 The neuropathy assessment was completed with the assistance of Yolande Jolly, RN without any difficulties. The patient is to continue her current treatment per Dr. Rogue Bussing assessment with no change in dosage or delays at this time. The patient was provided her questionnaires to complete prior to her chemotherapy treatment with Taxol. She understands that the research RN will see her again in 4 weeks for her 8 week assessment and if she has any questions or concerns to please call our department directly.  Jeral Fruit, RN, BSN, OCN  10/28/2019 1030 am

## 2019-10-29 ENCOUNTER — Telehealth: Payer: Self-pay | Admitting: *Deleted

## 2019-11-04 ENCOUNTER — Inpatient Hospital Stay: Payer: PPO

## 2019-11-04 ENCOUNTER — Other Ambulatory Visit: Payer: Self-pay

## 2019-11-04 VITALS — BP 137/73 | HR 87 | Temp 97.2°F | Resp 16 | Wt 191.0 lb

## 2019-11-04 DIAGNOSIS — Z5111 Encounter for antineoplastic chemotherapy: Secondary | ICD-10-CM | POA: Diagnosis not present

## 2019-11-04 DIAGNOSIS — D4861 Neoplasm of uncertain behavior of right breast: Secondary | ICD-10-CM

## 2019-11-04 LAB — CBC WITH DIFFERENTIAL/PLATELET
Abs Immature Granulocytes: 0.03 10*3/uL (ref 0.00–0.07)
Basophils Absolute: 0.1 10*3/uL (ref 0.0–0.1)
Basophils Relative: 1 %
Eosinophils Absolute: 0.1 10*3/uL (ref 0.0–0.5)
Eosinophils Relative: 2 %
HCT: 34.4 % — ABNORMAL LOW (ref 36.0–46.0)
Hemoglobin: 12 g/dL (ref 12.0–15.0)
Immature Granulocytes: 1 %
Lymphocytes Relative: 40 %
Lymphs Abs: 1.7 10*3/uL (ref 0.7–4.0)
MCH: 29.1 pg (ref 26.0–34.0)
MCHC: 34.9 g/dL (ref 30.0–36.0)
MCV: 83.5 fL (ref 80.0–100.0)
Monocytes Absolute: 0.2 10*3/uL (ref 0.1–1.0)
Monocytes Relative: 4 %
Neutro Abs: 2.2 10*3/uL (ref 1.7–7.7)
Neutrophils Relative %: 52 %
Platelets: 204 10*3/uL (ref 150–400)
RBC: 4.12 MIL/uL (ref 3.87–5.11)
RDW: 14 % (ref 11.5–15.5)
WBC: 4.2 10*3/uL (ref 4.0–10.5)
nRBC: 0 % (ref 0.0–0.2)

## 2019-11-04 LAB — COMPREHENSIVE METABOLIC PANEL
ALT: 50 U/L — ABNORMAL HIGH (ref 0–44)
AST: 40 U/L (ref 15–41)
Albumin: 4 g/dL (ref 3.5–5.0)
Alkaline Phosphatase: 63 U/L (ref 38–126)
Anion gap: 12 (ref 5–15)
BUN: 17 mg/dL (ref 8–23)
CO2: 24 mmol/L (ref 22–32)
Calcium: 8.9 mg/dL (ref 8.9–10.3)
Chloride: 103 mmol/L (ref 98–111)
Creatinine, Ser: 0.79 mg/dL (ref 0.44–1.00)
GFR calc Af Amer: >60 mL/min (ref >60)
GFR calc non Af Amer: >60 mL/min (ref >60)
Glucose, Bld: 169 mg/dL — ABNORMAL HIGH (ref 70–99)
Potassium: 3.5 mmol/L (ref 3.5–5.1)
Sodium: 139 mmol/L (ref 135–145)
Total Bilirubin: 0.8 mg/dL (ref 0.3–1.2)
Total Protein: 7 g/dL (ref 6.5–8.1)

## 2019-11-04 LAB — SURGICAL PATHOLOGY

## 2019-11-04 MED ORDER — DIPHENHYDRAMINE HCL 50 MG/ML IJ SOLN
50.0000 mg | Freq: Once | INTRAMUSCULAR | Status: AC
  Administered 2019-11-04: 50 mg via INTRAVENOUS
  Filled 2019-11-04: qty 1

## 2019-11-04 MED ORDER — HEPARIN SOD (PORK) LOCK FLUSH 100 UNIT/ML IV SOLN
500.0000 [IU] | Freq: Once | INTRAVENOUS | Status: AC | PRN
  Administered 2019-11-04: 500 [IU]
  Filled 2019-11-04: qty 5

## 2019-11-04 MED ORDER — SODIUM CHLORIDE 0.9 % IV SOLN
Freq: Once | INTRAVENOUS | Status: AC
  Filled 2019-11-04: qty 250

## 2019-11-04 MED ORDER — SODIUM CHLORIDE 0.9 % IV SOLN
80.0000 mg/m2 | Freq: Once | INTRAVENOUS | Status: AC
  Administered 2019-11-04: 162 mg via INTRAVENOUS
  Filled 2019-11-04: qty 27

## 2019-11-04 MED ORDER — FAMOTIDINE IN NACL 20-0.9 MG/50ML-% IV SOLN
20.0000 mg | Freq: Once | INTRAVENOUS | Status: AC
  Administered 2019-11-04: 20 mg via INTRAVENOUS
  Filled 2019-11-04: qty 50

## 2019-11-04 MED ORDER — SODIUM CHLORIDE 0.9 % IV SOLN
20.0000 mg | Freq: Once | INTRAVENOUS | Status: AC
  Administered 2019-11-04: 20 mg via INTRAVENOUS
  Filled 2019-11-04: qty 20

## 2019-11-04 MED ORDER — HEPARIN SOD (PORK) LOCK FLUSH 100 UNIT/ML IV SOLN
INTRAVENOUS | Status: AC
  Filled 2019-11-04: qty 5

## 2019-11-04 NOTE — Telephone Encounter (Signed)
Encounter opened in error

## 2019-11-11 ENCOUNTER — Inpatient Hospital Stay: Payer: PPO

## 2019-11-11 ENCOUNTER — Other Ambulatory Visit: Payer: Self-pay

## 2019-11-11 VITALS — BP 143/79 | HR 101 | Temp 99.0°F | Resp 18 | Wt 194.2 lb

## 2019-11-11 DIAGNOSIS — C50211 Malignant neoplasm of upper-inner quadrant of right female breast: Secondary | ICD-10-CM

## 2019-11-11 DIAGNOSIS — Z5111 Encounter for antineoplastic chemotherapy: Secondary | ICD-10-CM | POA: Diagnosis not present

## 2019-11-11 DIAGNOSIS — Z171 Estrogen receptor negative status [ER-]: Secondary | ICD-10-CM

## 2019-11-11 DIAGNOSIS — D4861 Neoplasm of uncertain behavior of right breast: Secondary | ICD-10-CM

## 2019-11-11 LAB — CBC WITH DIFFERENTIAL/PLATELET
Abs Immature Granulocytes: 0.17 10*3/uL — ABNORMAL HIGH (ref 0.00–0.07)
Basophils Absolute: 0.1 10*3/uL (ref 0.0–0.1)
Basophils Relative: 1 %
Eosinophils Absolute: 0.1 10*3/uL (ref 0.0–0.5)
Eosinophils Relative: 3 %
HCT: 31 % — ABNORMAL LOW (ref 36.0–46.0)
Hemoglobin: 10.7 g/dL — ABNORMAL LOW (ref 12.0–15.0)
Immature Granulocytes: 5 %
Lymphocytes Relative: 47 %
Lymphs Abs: 1.8 10*3/uL (ref 0.7–4.0)
MCH: 29 pg (ref 26.0–34.0)
MCHC: 34.5 g/dL (ref 30.0–36.0)
MCV: 84 fL (ref 80.0–100.0)
Monocytes Absolute: 0.3 10*3/uL (ref 0.1–1.0)
Monocytes Relative: 9 %
Neutro Abs: 1.3 10*3/uL — ABNORMAL LOW (ref 1.7–7.7)
Neutrophils Relative %: 35 %
Platelets: 229 10*3/uL (ref 150–400)
RBC: 3.69 MIL/uL — ABNORMAL LOW (ref 3.87–5.11)
RDW: 15 % (ref 11.5–15.5)
WBC: 3.8 10*3/uL — ABNORMAL LOW (ref 4.0–10.5)
nRBC: 0.5 % — ABNORMAL HIGH (ref 0.0–0.2)

## 2019-11-11 LAB — COMPREHENSIVE METABOLIC PANEL
ALT: 45 U/L — ABNORMAL HIGH (ref 0–44)
AST: 39 U/L (ref 15–41)
Albumin: 3.8 g/dL (ref 3.5–5.0)
Alkaline Phosphatase: 64 U/L (ref 38–126)
Anion gap: 11 (ref 5–15)
BUN: 13 mg/dL (ref 8–23)
CO2: 25 mmol/L (ref 22–32)
Calcium: 8.9 mg/dL (ref 8.9–10.3)
Chloride: 104 mmol/L (ref 98–111)
Creatinine, Ser: 0.75 mg/dL (ref 0.44–1.00)
GFR calc Af Amer: >60 mL/min (ref >60)
GFR calc non Af Amer: >60 mL/min (ref >60)
Glucose, Bld: 200 mg/dL — ABNORMAL HIGH (ref 70–99)
Potassium: 3.5 mmol/L (ref 3.5–5.1)
Sodium: 140 mmol/L (ref 135–145)
Total Bilirubin: 0.5 mg/dL (ref 0.3–1.2)
Total Protein: 6.6 g/dL (ref 6.5–8.1)

## 2019-11-11 MED ORDER — SODIUM CHLORIDE 0.9 % IV SOLN
80.0000 mg/m2 | Freq: Once | INTRAVENOUS | Status: AC
  Administered 2019-11-11: 162 mg via INTRAVENOUS
  Filled 2019-11-11: qty 27

## 2019-11-11 MED ORDER — SODIUM CHLORIDE 0.9 % IV SOLN
Freq: Once | INTRAVENOUS | Status: AC
  Filled 2019-11-11: qty 250

## 2019-11-11 MED ORDER — HEPARIN SOD (PORK) LOCK FLUSH 100 UNIT/ML IV SOLN
INTRAVENOUS | Status: AC
  Filled 2019-11-11: qty 5

## 2019-11-11 MED ORDER — FAMOTIDINE IN NACL 20-0.9 MG/50ML-% IV SOLN
20.0000 mg | Freq: Once | INTRAVENOUS | Status: AC
  Administered 2019-11-11: 20 mg via INTRAVENOUS
  Filled 2019-11-11: qty 50

## 2019-11-11 MED ORDER — SODIUM CHLORIDE 0.9 % IV SOLN
20.0000 mg | Freq: Once | INTRAVENOUS | Status: AC
  Administered 2019-11-11: 20 mg via INTRAVENOUS
  Filled 2019-11-11: qty 20

## 2019-11-11 MED ORDER — DIPHENHYDRAMINE HCL 50 MG/ML IJ SOLN
50.0000 mg | Freq: Once | INTRAMUSCULAR | Status: AC
  Administered 2019-11-11: 50 mg via INTRAVENOUS
  Filled 2019-11-11: qty 1

## 2019-11-11 MED ORDER — SODIUM CHLORIDE 0.9% FLUSH
10.0000 mL | INTRAVENOUS | Status: DC | PRN
  Filled 2019-11-11: qty 10

## 2019-11-11 MED ORDER — HEPARIN SOD (PORK) LOCK FLUSH 100 UNIT/ML IV SOLN
500.0000 [IU] | Freq: Once | INTRAVENOUS | Status: AC | PRN
  Administered 2019-11-11: 500 [IU]
  Filled 2019-11-11: qty 5

## 2019-11-11 NOTE — Progress Notes (Signed)
ANC 1.3 ok to proceed per md 

## 2019-11-11 NOTE — Progress Notes (Signed)
ANC 1.3. Per Dr. Rogue Bussing, okay to proceed with treatment.

## 2019-11-18 ENCOUNTER — Inpatient Hospital Stay: Payer: PPO | Admitting: Internal Medicine

## 2019-11-18 ENCOUNTER — Other Ambulatory Visit: Payer: Self-pay | Admitting: *Deleted

## 2019-11-18 ENCOUNTER — Inpatient Hospital Stay: Payer: PPO

## 2019-11-18 ENCOUNTER — Other Ambulatory Visit: Payer: Self-pay

## 2019-11-18 ENCOUNTER — Encounter: Payer: Self-pay | Admitting: Internal Medicine

## 2019-11-18 DIAGNOSIS — C50211 Malignant neoplasm of upper-inner quadrant of right female breast: Secondary | ICD-10-CM | POA: Diagnosis not present

## 2019-11-18 DIAGNOSIS — Z171 Estrogen receptor negative status [ER-]: Secondary | ICD-10-CM

## 2019-11-18 DIAGNOSIS — D4861 Neoplasm of uncertain behavior of right breast: Secondary | ICD-10-CM

## 2019-11-18 DIAGNOSIS — Z5111 Encounter for antineoplastic chemotherapy: Secondary | ICD-10-CM | POA: Diagnosis not present

## 2019-11-18 LAB — CBC WITH DIFFERENTIAL/PLATELET
Abs Immature Granulocytes: 0.09 10*3/uL — ABNORMAL HIGH (ref 0.00–0.07)
Basophils Absolute: 0 10*3/uL (ref 0.0–0.1)
Basophils Relative: 1 %
Eosinophils Absolute: 0.1 10*3/uL (ref 0.0–0.5)
Eosinophils Relative: 1 %
HCT: 31.1 % — ABNORMAL LOW (ref 36.0–46.0)
Hemoglobin: 10.8 g/dL — ABNORMAL LOW (ref 12.0–15.0)
Immature Granulocytes: 2 %
Lymphocytes Relative: 49 %
Lymphs Abs: 2.4 10*3/uL (ref 0.7–4.0)
MCH: 29.7 pg (ref 26.0–34.0)
MCHC: 34.7 g/dL (ref 30.0–36.0)
MCV: 85.4 fL (ref 80.0–100.0)
Monocytes Absolute: 0.3 10*3/uL (ref 0.1–1.0)
Monocytes Relative: 7 %
Neutro Abs: 1.9 10*3/uL (ref 1.7–7.7)
Neutrophils Relative %: 40 %
Platelets: 288 10*3/uL (ref 150–400)
RBC: 3.64 MIL/uL — ABNORMAL LOW (ref 3.87–5.11)
RDW: 16 % — ABNORMAL HIGH (ref 11.5–15.5)
WBC: 4.8 10*3/uL (ref 4.0–10.5)
nRBC: 0.4 % — ABNORMAL HIGH (ref 0.0–0.2)

## 2019-11-18 LAB — COMPREHENSIVE METABOLIC PANEL
ALT: 47 U/L — ABNORMAL HIGH (ref 0–44)
AST: 45 U/L — ABNORMAL HIGH (ref 15–41)
Albumin: 4.1 g/dL (ref 3.5–5.0)
Alkaline Phosphatase: 65 U/L (ref 38–126)
Anion gap: 12 (ref 5–15)
BUN: 14 mg/dL (ref 8–23)
CO2: 23 mmol/L (ref 22–32)
Calcium: 8.7 mg/dL — ABNORMAL LOW (ref 8.9–10.3)
Chloride: 102 mmol/L (ref 98–111)
Creatinine, Ser: 0.87 mg/dL (ref 0.44–1.00)
GFR calc Af Amer: >60 mL/min (ref >60)
GFR calc non Af Amer: >60 mL/min (ref >60)
Glucose, Bld: 176 mg/dL — ABNORMAL HIGH (ref 70–99)
Potassium: 3.4 mmol/L — ABNORMAL LOW (ref 3.5–5.1)
Sodium: 137 mmol/L (ref 135–145)
Total Bilirubin: 0.6 mg/dL (ref 0.3–1.2)
Total Protein: 6.8 g/dL (ref 6.5–8.1)

## 2019-11-18 MED ORDER — SODIUM CHLORIDE 0.9 % IV SOLN
10.0000 mg | Freq: Once | INTRAVENOUS | Status: AC
  Administered 2019-11-18: 10 mg via INTRAVENOUS
  Filled 2019-11-18: qty 10

## 2019-11-18 MED ORDER — FAMOTIDINE IN NACL 20-0.9 MG/50ML-% IV SOLN
20.0000 mg | Freq: Once | INTRAVENOUS | Status: AC
  Administered 2019-11-18: 20 mg via INTRAVENOUS
  Filled 2019-11-18: qty 50

## 2019-11-18 MED ORDER — SODIUM CHLORIDE 0.9 % IV SOLN
510.0000 mg | Freq: Once | INTRAVENOUS | Status: AC
  Administered 2019-11-18: 510 mg via INTRAVENOUS
  Filled 2019-11-18: qty 51

## 2019-11-18 MED ORDER — DIPHENHYDRAMINE HCL 50 MG/ML IJ SOLN
50.0000 mg | Freq: Once | INTRAMUSCULAR | Status: AC
  Administered 2019-11-18: 50 mg via INTRAVENOUS
  Filled 2019-11-18: qty 1

## 2019-11-18 MED ORDER — HEPARIN SOD (PORK) LOCK FLUSH 100 UNIT/ML IV SOLN
500.0000 [IU] | Freq: Once | INTRAVENOUS | Status: AC
  Administered 2019-11-18: 500 [IU] via INTRAVENOUS
  Filled 2019-11-18: qty 5

## 2019-11-18 MED ORDER — HEPARIN SOD (PORK) LOCK FLUSH 100 UNIT/ML IV SOLN
INTRAVENOUS | Status: AC
  Filled 2019-11-18: qty 5

## 2019-11-18 MED ORDER — SODIUM CHLORIDE 0.9% FLUSH
10.0000 mL | INTRAVENOUS | Status: DC | PRN
  Administered 2019-11-18: 10 mL via INTRAVENOUS
  Filled 2019-11-18: qty 10

## 2019-11-18 MED ORDER — PALONOSETRON HCL INJECTION 0.25 MG/5ML
0.2500 mg | Freq: Once | INTRAVENOUS | Status: AC
  Administered 2019-11-18: 0.25 mg via INTRAVENOUS
  Filled 2019-11-18: qty 5

## 2019-11-18 MED ORDER — SODIUM CHLORIDE 0.9 % IV SOLN
Freq: Once | INTRAVENOUS | Status: AC
  Filled 2019-11-18: qty 250

## 2019-11-18 MED ORDER — SODIUM CHLORIDE 0.9 % IV SOLN
150.0000 mg | Freq: Once | INTRAVENOUS | Status: AC
  Administered 2019-11-18: 150 mg via INTRAVENOUS
  Filled 2019-11-18: qty 150

## 2019-11-18 MED ORDER — SODIUM CHLORIDE 0.9 % IV SOLN
80.0000 mg/m2 | Freq: Once | INTRAVENOUS | Status: AC
  Administered 2019-11-18: 162 mg via INTRAVENOUS
  Filled 2019-11-18: qty 27

## 2019-11-18 NOTE — Assessment & Plan Note (Addendum)
#   Right breast cancer-Stage IIB- T2N1-TRIPLE NEGATIVE; PET July 11th-right breast mass right underarm lymphadenopathy.  Partial response noted on physical exam.  We will plan to get mammogram/ultrasound in the next 2 weeks.  # Proceed carb-Taxol Labs today reviewed;  acceptable for treatment today.    # DM-post breakfast blood sugars 176; stable; on  metformin 1000 mg BID.   # # Genetic counseling-   Patient meets the NCCN criteria for genetic testing  [mother with ovarian cancer Sister with melanoma].awaiting genetics evaluation.  # PN-1- sec to taxol/ Clinical trial- STABLE  # DISPOSITION: # chemo today # in 2 weeks-Right breast diagnostic mammo/Ultrasound  re: response to therapy # 1 week- labs- cbc/bmp;Taxol # 2 week- labs- cbc/bmp;Taxol # 3 week- MD; labs- cbc/cmp;Taxol-Carbo-Dr.B

## 2019-11-18 NOTE — Progress Notes (Signed)
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 Burnett.]   # [id number] Carbo-taxol-AC #1  # SURVIVORSHIP: Pending  # GENETICS: Pending  DIAGNOSIS: Right breast cancer triple negative  STAGE:   IIB      ;  GOALS: cure  CURRENT/MOST RECENT THERAPY : carbo-taxol-AC    Carcinoma of upper-inner quadrant of right breast in female, estrogen receptor negative ([place])  10/07/2019 Initial Diagnosis   Carcinoma of upper-inner quadrant of right breast in female, estrogen receptor negative ([place])      HISTORY OF PRESENTING ILLNESS:  Ashlee Cox 69 y.o.  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  Patient complains of mild fatigue.  Otherwise no nausea no vomiting abdominal pain.  No blood in stools or black or stools.  Lost her hair.  Mild tingling and numbness in the extremities does not interrupt her daily lifestyle.  No pain.  Review of Systems  Constitutional: Negative for chills, diaphoresis, fever, malaise/fatigue and weight loss.  HENT: Negative for nosebleeds and sore throat.   Eyes: Negative for double vision.  Respiratory: Negative for cough, hemoptysis, sputum production, shortness of breath and wheezing.   Cardiovascular: Negative for chest pain, palpitations, orthopnea and leg swelling.  Gastrointestinal: Negative for abdominal pain, blood in stool, constipation, diarrhea, heartburn, melena, nausea and vomiting.  Genitourinary: Negative for dysuria, frequency and urgency.  Musculoskeletal: Negative for back pain and joint pain.  Skin: Negative.  Negative for itching and rash.  Neurological: Positive for tingling. Negative for  dizziness, focal weakness, weakness and headaches.  Endo/Heme/Allergies: Does not bruise/bleed easily.  Psychiatric/Behavioral: Negative for depression. The patient is not nervous/anxious and does not have insomnia.      MEDICAL HISTORY:  Past Medical History:  Diagnosis Date  . Cyst, breast    benign  . GERD (gastroesophageal reflux disease)   . H/O: rheumatic fever   . 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 Hyperlipidemia   . Hypertension   . Menopause    at age 40    SURGICAL HISTORY: Past Surgical History:  Procedure Laterality Date  . APPENDECTOMY  2006  . BREAST CYST ASPIRATION Left 1999  . CHOLECYSTECTOMY  2006  . PORTACATH PLACEMENT Left 10/06/2019   Procedure: INSERTION PORT-A-CATH;  Surgeon: Robert Bellow, MD;  Location: [facility] ORS;  Service: General;  Laterality: Left;  . SUBMUCOSAL TATTOO INJECTION Right 10/06/2019   Procedure: Right axillary TATTOO INJECTION;  Surgeon: Robert Bellow, MD;  Location: [facility] ORS;  Service: General;  Laterality: Right;  Marland Kitchen VAGINAL DELIVERY     x3    SOCIAL HISTORY: Social History   Socioeconomic History  . Marital status: Widowed    Spouse name: Not on file  . Number of children: Not on file  . Years of education: Not on file  . Highest education level: Not on file  Occupational History  . Occupation: Glass blower/designer: [id number]  Tobacco Use  . Smoking status: Former Smoker    Years: 1.00    Types: Cigarettes    Quit date: 12/26/2005    Years since quitting: 13.9  . Smokeless tobacco: Never Used  .  Tobacco comment: smoked for less than 1 years,  1 cig/day  Substance and Sexual Activity  . Alcohol use: No  . Drug use: No  . Sexual activity: Never    Partners: Female  Other Topics Concern  . Not on file  Social History Narrative   Widowed, March 2014; lives with fiance.      [employer] retd; quit smoking 1ppw; no alcohol.       Social Determinants of Health    Financial Resource Strain:   . Difficulty of Paying Living Expenses: Not on file  Food Insecurity:   . Worried About Charity fundraiser in the Last Year: Not on file  . Ran Out of Food in the Last Year: Not on file  Transportation Needs:   . Lack of Transportation (Medical): Not on file  . Lack of Transportation (Non-Medical): Not on file  Physical Activity:   . Days of Exercise per Week: Not on file  . Minutes of Exercise per Session: Not on file  Stress:   . Feeling of Stress : Not on file  Social Connections:   . Frequency of Communication with Friends and Family: Not on file  . Frequency of Social Gatherings with Friends and Family: Not on file  . Attends Religious Services: Not on file  . Active Member of Clubs or Organizations: Not on file  . Attends Archivist Meetings: Not on file  . Marital Status: Not on file  Intimate Partner Violence:   . Fear of Current or Ex-Partner: Not on file  . Emotionally Abused: Not on file  . Physically Abused: Not on file  . Sexually Abused: Not on file    FAMILY HISTORY: Family History  Problem Relation Age of Onset  . Cancer Mother 21       ovarian- died 4-5 years.   . Heart disease Mother 106  . Diabetes Mother   . Stroke Father 4  . Diabetes Father   . Diabetes Sister   . Melanoma Sister        survived  . Breast cancer Neg Hx     ALLERGIES:  has No Known Allergies.  MEDICATIONS:  Current Outpatient Medications  Medication Sig Dispense Refill  . losartan-hydrochlorothiazide (HYZAAR) 50-12.5 MG tablet Take 1 tablet by mouth daily. 90 tablet 3  . lovastatin (MEVACOR) 40 MG tablet TAKE 1 TABLET BY MOUTH AT BEDTIME (Patient taking differently: Take 40 mg by mouth in the morning. ) 90 tablet 3  . metFORMIN (GLUCOPHAGE) 500 MG tablet Take 2 tablets (1,000 mg total) by mouth 2 (two) times daily with a meal. 120 tablet 3  . Multiple Vitamin (MULTIVITAMIN) tablet Take 1 tablet by mouth daily.      Marland Kitchen omeprazole  (PRILOSEC) 20 MG capsule Take 1 capsule by mouth once daily 90 capsule 0  . docusate sodium (COLACE) 100 MG capsule Take 100 mg by mouth daily. (Patient not taking: Reported on 10/27/2019)    . ondansetron (ZOFRAN) 8 MG tablet One pill every 8 hours as needed for nausea/vomitting. (Patient not taking: Reported on 10/27/2019) 40 tablet 1  . prochlorperazine (COMPAZINE) 10 MG tablet Take 1 tablet (10 mg total) by mouth every 6 (six) hours as needed for nausea or vomiting. (Patient not taking: Reported on 10/27/2019) 40 tablet 1   No current facility-administered medications for this visit.   Facility-Administered Medications Ordered in Other Visits  Medication Dose Route Frequency Provider Last Rate Last Admin  . sodium chloride flush (NS) 0.9 %  injection 10 mL  10 mL Intravenous PRN Cammie Sickle, MD   10 mL at 11/18/19 0830      .  PHYSICAL EXAMINATION: ECOG PERFORMANCE STATUS: 0 - Asymptomatic  Vitals:   11/18/19 0901  BP: (!) 152/70  Pulse: 93  Resp: 16  Temp: 98.5 F (36.9 C)  SpO2: 100%   Filed Weights   11/18/19 0901  Weight: 195 lb 9.6 oz (88.7 kg)    Physical Exam HENT:     Head: Normocephalic and atraumatic.     Mouth/Throat:     Pharynx: No oropharyngeal exudate.  Eyes:     Pupils: Pupils are equal, round, and reactive to light.  Cardiovascular:     Rate and Rhythm: Normal rate and regular rhythm.  Pulmonary:     Effort: No respiratory distress.     Breath sounds: No wheezing.  Abdominal:     General: Bowel sounds are normal. There is no distension.     Palpations: Abdomen is soft. There is no mass.     Tenderness: There is no abdominal tenderness. There is no guarding or rebound.  Musculoskeletal:        General: No tenderness. Normal range of motion.     Cervical back: Normal range of motion and neck supple.  Skin:    General: Skin is warm.     Comments: Right breast-right upper quadrant vague approximately 1 to 2 cm mass felt.  No skin changes.  No  nipple changes. (In presence of chaperone)   Neurological:     Mental Status: She is alert and oriented to person, place, and time.  Psychiatric:        Mood and Affect: Affect normal.      LABORATORY DATA:  I have reviewed the data as listed Lab Results  Component Value Date   WBC 4.8 11/18/2019   HGB 10.8 (L) 11/18/2019   HCT 31.1 (L) 11/18/2019   MCV 85.4 11/18/2019   PLT 288 11/18/2019   Recent Labs    11/04/19 0917 11/11/19 0849 11/18/19 0823  NA 139 140 137  K 3.5 3.5 3.4*  CL 103 104 102  CO2 24 25 23   GLUCOSE 169* 200* 176*  BUN 17 13 14   CREATININE 0.79 0.75 0.87  CALCIUM 8.9 8.9 8.7*  GFRNONAA >60 >60 >60  GFRAA >60 >60 >60  PROT 7.0 6.6 6.8  ALBUMIN 4.0 3.8 4.1  AST 40 39 45*  ALT 50* 45* 47*  ALKPHOS 63 64 65  BILITOT 0.8 0.5 0.6    RADIOGRAPHIC STUDIES: I have personally reviewed the radiological images as listed and agreed with the findings in the report. No results found.  ASSESSMENT & PLAN:   Carcinoma of upper-inner quadrant of right breast in female, estrogen receptor negative ([place]) # Right breast cancer-Stage IIB- T2N1-TRIPLE NEGATIVE; PET July 11th-right breast mass right underarm lymphadenopathy.  Partial response noted on physical exam.  We will plan to get mammogram/ultrasound in the next 2 weeks.  # Proceed carb-Taxol Labs today reviewed;  acceptable for treatment today.    # DM-post breakfast blood sugars 176; stable; on  metformin 1000 mg BID.   # # Genetic counseling-   Patient meets the NCCN criteria for genetic testing  [mother with ovarian cancer Sister with melanoma].awaiting genetics evaluation.  # PN-1- sec to taxol/ Clinical trial- STABLE  # DISPOSITION: # chemo today # in 2 weeks-Right breast diagnostic mammo/Ultrasound  re: response to therapy # 1 week- labs- cbc/bmp;Taxol # 2  week- labs- cbc/bmp;Taxol # 3 week- MD; labs- cbc/cmp;Taxol-Carbo-Dr.B     All questions were answered. The patient/family knows to call  the clinic with any problems, questions or concerns.   Cammie Sickle, MD 11/18/2019 1:55 PM

## 2019-11-25 ENCOUNTER — Inpatient Hospital Stay: Payer: PPO

## 2019-11-25 ENCOUNTER — Other Ambulatory Visit: Payer: Self-pay

## 2019-11-25 ENCOUNTER — Encounter: Payer: Self-pay | Admitting: Internal Medicine

## 2019-11-25 ENCOUNTER — Inpatient Hospital Stay (HOSPITAL_BASED_OUTPATIENT_CLINIC_OR_DEPARTMENT_OTHER): Payer: PPO | Admitting: Licensed Clinical Social Worker

## 2019-11-25 ENCOUNTER — Encounter: Payer: Self-pay | Admitting: Licensed Clinical Social Worker

## 2019-11-25 ENCOUNTER — Inpatient Hospital Stay (HOSPITAL_BASED_OUTPATIENT_CLINIC_OR_DEPARTMENT_OTHER): Payer: PPO | Admitting: Internal Medicine

## 2019-11-25 VITALS — BP 145/84 | HR 86

## 2019-11-25 VITALS — BP 159/83 | HR 111 | Temp 97.8°F | Wt 191.8 lb

## 2019-11-25 DIAGNOSIS — C50211 Malignant neoplasm of upper-inner quadrant of right female breast: Secondary | ICD-10-CM

## 2019-11-25 DIAGNOSIS — Z8041 Family history of malignant neoplasm of ovary: Secondary | ICD-10-CM | POA: Insufficient documentation

## 2019-11-25 DIAGNOSIS — R000 Tachycardia, unspecified: Secondary | ICD-10-CM

## 2019-11-25 DIAGNOSIS — Z5111 Encounter for antineoplastic chemotherapy: Secondary | ICD-10-CM | POA: Diagnosis not present

## 2019-11-25 DIAGNOSIS — Z171 Estrogen receptor negative status [ER-]: Secondary | ICD-10-CM | POA: Diagnosis not present

## 2019-11-25 DIAGNOSIS — Z808 Family history of malignant neoplasm of other organs or systems: Secondary | ICD-10-CM | POA: Insufficient documentation

## 2019-11-25 LAB — BASIC METABOLIC PANEL
Anion gap: 12 (ref 5–15)
BUN: 19 mg/dL (ref 8–23)
CO2: 23 mmol/L (ref 22–32)
Calcium: 8.5 mg/dL — ABNORMAL LOW (ref 8.9–10.3)
Chloride: 101 mmol/L (ref 98–111)
Creatinine, Ser: 0.88 mg/dL (ref 0.44–1.00)
GFR calc Af Amer: >60 mL/min (ref >60)
GFR calc non Af Amer: >60 mL/min (ref >60)
Glucose, Bld: 213 mg/dL — ABNORMAL HIGH (ref 70–99)
Potassium: 3.2 mmol/L — ABNORMAL LOW (ref 3.5–5.1)
Sodium: 136 mmol/L (ref 135–145)

## 2019-11-25 LAB — CBC WITH DIFFERENTIAL/PLATELET
Abs Immature Granulocytes: 0.04 10*3/uL (ref 0.00–0.07)
Basophils Absolute: 0.1 10*3/uL (ref 0.0–0.1)
Basophils Relative: 1 %
Eosinophils Absolute: 0 10*3/uL (ref 0.0–0.5)
Eosinophils Relative: 1 %
HCT: 30.4 % — ABNORMAL LOW (ref 36.0–46.0)
Hemoglobin: 11 g/dL — ABNORMAL LOW (ref 12.0–15.0)
Immature Granulocytes: 1 %
Lymphocytes Relative: 51 %
Lymphs Abs: 2 10*3/uL (ref 0.7–4.0)
MCH: 30.6 pg (ref 26.0–34.0)
MCHC: 36.2 g/dL — ABNORMAL HIGH (ref 30.0–36.0)
MCV: 84.7 fL (ref 80.0–100.0)
Monocytes Absolute: 0.2 10*3/uL (ref 0.1–1.0)
Monocytes Relative: 6 %
Neutro Abs: 1.5 10*3/uL — ABNORMAL LOW (ref 1.7–7.7)
Neutrophils Relative %: 40 %
Platelets: 264 10*3/uL (ref 150–400)
RBC: 3.59 MIL/uL — ABNORMAL LOW (ref 3.87–5.11)
RDW: 16.5 % — ABNORMAL HIGH (ref 11.5–15.5)
WBC: 3.8 10*3/uL — ABNORMAL LOW (ref 4.0–10.5)
nRBC: 0 % (ref 0.0–0.2)

## 2019-11-25 MED ORDER — HEPARIN SOD (PORK) LOCK FLUSH 100 UNIT/ML IV SOLN
500.0000 [IU] | Freq: Once | INTRAVENOUS | Status: AC | PRN
  Administered 2019-11-25: 500 [IU]
  Filled 2019-11-25: qty 5

## 2019-11-25 MED ORDER — SODIUM CHLORIDE 0.9 % IV SOLN
Freq: Once | INTRAVENOUS | Status: AC
  Filled 2019-11-25: qty 250

## 2019-11-25 MED ORDER — HEPARIN SOD (PORK) LOCK FLUSH 100 UNIT/ML IV SOLN
INTRAVENOUS | Status: AC
  Filled 2019-11-25: qty 5

## 2019-11-25 NOTE — Progress Notes (Signed)
Per Nira Conn, RN per Dr. Rogue Bussing, patient to not receive treatment today. Patient will instead get 1L IVF. Okay to leave port accessed per Nira Conn, RN due to patient coming back tomorrow with [facility].

## 2019-11-25 NOTE — Assessment & Plan Note (Addendum)
#   Right breast cancer-Stage IIB- T2N1-TRIPLE NEGATIVE; PET July 11th-right breast mass right underarm lymphadenopathy.  Partial response noted on physical exam.  Awaiting  mammogram/ultrasound in the next 2 weeks.  # HOLD Taxol chemo today; given acute issues [see below]; acute issues likely secondary to cycle #3 of carboplatin 1 week ago.  # Poor PO intake/diarrhea [2 a day]/sinus tachycardia-EKG sinus rhythm no concerns for PE oxygen saturation 90%- plan IVFs over 1 hour today; hold obstructive because of diabetes.  Reevaluate tomorrow.   # DM-post breakfast blood sugars 176; stable; on  metformin 1000 mg BID.   # Genetic counseling-awaiting genetics today;[mother with ovarian cancer Sister with melanoma]  # PN-1-2 sec to taxol/ Clinical trial- STABLE  # DISPOSITION: # HOLD chemo today; IVF 1 lit over 1 hour; # [place] tomorrow-posible  IVF 1 lit over 1 hour;  # 1 week- MD; labs- cbc/bmp;Taxol-  # keep other appt as planned-Dr.B

## 2019-11-25 NOTE — Progress Notes (Signed)
Patient stated she had felt more SOB, fatigued, and weak x "a couple of weeks". Reports this has been progressive with each treatment. Dr. Rogue Bussing and team made aware. Dr. Rogue Bussing to see patient prior to treatment.

## 2019-11-25 NOTE — Progress Notes (Signed)
Patient scheduled for lab/taxol. She reported to infusion RN that she has been experiencing increase shortness of breath over the last week. Reports lower extremity weakness at times to where she has felt loss of balance. She has not had any falls. 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 She has felt bloated but has 2-3 daily bowel movements. She does not know if this if her metformin has contributed to the GI bloating symptoms.     0930 am - 2 min walk. Oxygen sats 96% room air. HR at 125. Patient felt weak and needed to stop. Stated she "felt like her legs were going to give out" v/o from Dr. Rogue Bussing for stat ekg.

## 2019-11-25 NOTE — Progress Notes (Signed)
REFERRING PROVIDER: Cammie Sickle, MD [place],  [place] [address]  PRIMARY PROVIDER:  [name], MD  PRIMARY REASON FOR VISIT:  1. Carcinoma of upper-inner quadrant of right breast in female, estrogen receptor negative ([place])   2. Family history of ovarian cancer   3. Family history of melanoma      HISTORY OF PRESENT ILLNESS:   Ms. Ashlee Cox, a 69 y.o. female, was seen for a [facility] cancer genetics consultation at the request of Dr. Rogue Bussing due to a personal and family history of cancer.  Ms. Teston presents to clinic today to discuss the possibility of a hereditary predisposition to cancer, genetic testing, and to further clarify her future cancer risks, as well as potential cancer risks for family members.   In 2021, at the age of 19, Ms. Estrada was diagnosed with invasive mammary carcinoma of the right breast, triple negative. She is currently being treated with chemotherapy and has not had surgery yet.     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 Burnett.]   # [id number] Carbo-taxol-AC #1  # SURVIVORSHIP: Pending  # GENETICS: Pending  DIAGNOSIS: Right breast cancer triple negative  STAGE:   IIB      ;  GOALS: cure  CURRENT/MOST RECENT THERAPY : carbo-taxol-AC    Carcinoma of upper-inner quadrant of right breast in female, estrogen receptor negative ([place])  10/07/2019 Initial Diagnosis   Carcinoma of upper-inner quadrant of right breast in female, estrogen receptor negative ([place])      RISK FACTORS:  Menarche was at age 67.  First live birth at age 21.  OCP use for approximately 5 years.  Ovaries intact: yes.  Hysterectomy: no.  Menopausal status: postmenopausal.  HRT use: 0 years. Colonoscopy: yes; normal. Mammogram within the last year: yes. Number of breast biopsies: 1. Up to date with pelvic exams: yes. Any excessive radiation exposure in the past:  no  Past Medical History:  Diagnosis Date  . Cyst, breast    benign  . Family history of melanoma   . Family history of ovarian cancer   . GERD (gastroesophageal reflux disease)   . H/O: rheumatic fever   . 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 Hyperlipidemia   . Hypertension   . Menopause    at age 76    Past Surgical History:  Procedure Laterality Date  . APPENDECTOMY  2006  . BREAST CYST ASPIRATION Left 1999  . CHOLECYSTECTOMY  2006  . PORTACATH PLACEMENT Left 10/06/2019   Procedure: INSERTION PORT-A-CATH;  Surgeon: Robert Bellow, MD;  Location: [facility] ORS;  Service: General;  Laterality: Left;  . SUBMUCOSAL TATTOO INJECTION Right 10/06/2019   Procedure: Right axillary TATTOO INJECTION;  Surgeon: Robert Bellow, MD;  Location: [facility] ORS;  Service: General;  Laterality: Right;  Marland Kitchen VAGINAL DELIVERY     x3    Social History   Socioeconomic History  . Marital status: Widowed    Spouse name: Not on file  . Number of children: Not on file  . Years of education: Not on file  . Highest education level: Not on file  Occupational History  . Occupation: Glass blower/designer: [id number]  Tobacco Use  . Smoking status: Former Smoker    Years: 1.00    Types: Cigarettes    Quit date: 12/26/2005    Years  since quitting: 13.9  . Smokeless tobacco: Never Used  . Tobacco comment: smoked for less than 1 years,  1 cig/day  Substance and Sexual Activity  . Alcohol use: No  . Drug use: No  . Sexual activity: Never    Partners: Female  Other Topics Concern  . Not on file  Social History Narrative   Widowed, March 2014; lives with fiance.      [employer] retd; quit smoking 1ppw; no alcohol.       Social Determinants of Health   Financial Resource Strain:   . Difficulty of Paying Living Expenses: Not on file  Food Insecurity:   . Worried About Charity fundraiser in the Last Year: Not on file  . Ran Out of Food in the  Last Year: Not on file  Transportation Needs:   . Lack of Transportation (Medical): Not on file  . Lack of Transportation (Non-Medical): Not on file  Physical Activity:   . Days of Exercise per Week: Not on file  . Minutes of Exercise per Session: Not on file  Stress:   . Feeling of Stress : Not on file  Social Connections:   . Frequency of Communication with Friends and Family: Not on file  . Frequency of Social Gatherings with Friends and Family: Not on file  . Attends Religious Services: Not on file  . Active Member of Clubs or Organizations: Not on file  . Attends Archivist Meetings: Not on file  . Marital Status: Not on file     FAMILY HISTORY:  We obtained a detailed, 4-generation family history.  Significant diagnoses are listed below: Family History  Problem Relation Age of Onset  . Cancer Mother 42       ovarian- died 4-5 years.   . Heart disease Mother 44  . Diabetes Mother   . Stroke Father 73  . Diabetes Father   . Diabetes Sister   . Melanoma Sister        survived  . Breast cancer Neg Hx    Ms. Smyser has 2 daughters, ages 5 and 42 and a son, 64. None have had cancer. One of her daughters, Altha Harm, had negative genetic testing in the past. Patient has two sisters. One sister had melanoma removed from her arm, she had lots of sun exposure. Her other sister had total hysterectomy due to tumors on her ovaries.  Ms. Metzner mother had ovarian cancer when she was 23 and died at 33. Patient had 2 maternal aunts and 1 maternal uncle. One of her aunts may have had cancer. No known cancers in maternal cousins. Maternal grandparents both had skin cancers, they passed in their 70s.  Ms. Igo father died at 43. Patient had 1 paternal aunt, no cancers. She is unaware of her paternal cousins' health history. Paternal grandparents passed in their 39s-80s.  Ms. Haworth is aware of previous family history of genetic testing for hereditary cancer  risks. Patient's maternal ancestors are of unknown descent, and paternal ancestors are of unknown descent. There is no reported Ashkenazi Jewish ancestry. There is no known consanguinity.    GENETIC COUNSELING ASSESSMENT: Ms. Burgin is a 69 y.o. female with a personal and family history which is somewhat suggestive of a hereditary cancer syndrome and predisposition to cancer. We, therefore, discussed and recommended the following at today's visit.   DISCUSSION: We discussed that 5-10% of cancer is hereditary meaning that it is due to a mutation in a single gene that is  passed down from generation to generation in a family. About 15-25% of ovarian cancer is hereditary. Most cases of hereditary breast/ovarian cancer are associated with BRCA1/BRCA2 genes, although there are other genes associated with hereditary  cancer as well.  We discussed that testing is beneficial for several reasons including surgical decision-making for breast cancer, knowing about other cancer risks, identifying potential screening and risk-reduction options that may be appropriate, and to understand if other family members could be at risk for cancer and allow them to undergo genetic testing.   We reviewed the characteristics, features and inheritance patterns of hereditary cancer syndromes. We also discussed genetic testing, including the appropriate family members to test, the process of testing, insurance coverage and turn-around-time for results. We discussed the implications of a negative, positive and/or variant of uncertain significant result. We recommended Ms. Jasper pursue genetic testing for the [facility] Common Hereditary Cancers gene panel.   The Common Hereditary Cancers Panel offered by [facility] includes sequencing and/or deletion duplication testing of the following 48 genes: APC, ATM, AXIN2, BARD1, BMPR1A, BRCA1, BRCA2, BRIP1, CDH1, CDKN2A (p14ARF), CDKN2A (p16INK4a), CKD4, CHEK2, CTNNA1, DICER1, EPCAM  (Deletion/duplication testing only), GREM1 (promoter region deletion/duplication testing only), KIT, MEN1, MLH1, MSH2, MSH3, MSH6, MUTYH, NBN, NF1, NHTL1, PALB2, PDGFRA, PMS2, POLD1, POLE, PTEN, RAD50, RAD51C, RAD51D, RNF43, SDHB, SDHC, SDHD, SMAD4, SMARCA4. STK11, TP53, TSC1, TSC2, and VHL.  The following genes were evaluated for sequence changes only: SDHA and HOXB13 c.251G>A variant only.  Based on Ms. Rehfeld's personal and family history of cancer, she meets medical criteria for genetic testing. Despite that she meets criteria, she may still have an out of pocket cost.   PLAN: After considering the risks, benefits, and limitations, Ms. Dimaio provided informed consent to pursue genetic testing and the blood sample was [facility] to [facility] for analysis of the Common Hereditary Cancers Panel. Results should be available within approximately 2-3 weeks' time, at which point they will be disclosed by telephone to Ms. Frison, as will any additional recommendations warranted by these results. Ms. Degraffenreid will receive a summary of her genetic counseling visit and a copy of her results once available. This information will also be available in Epic.   Ms. Short questions were answered to her satisfaction today. Our contact information was provided should additional questions or concerns arise. Thank you for the referral and allowing [place] to share in the care of your patient.   Faith Rogue, MS, [facility] Genetic Counselor [email or web address] Phone: [phone]  The patient was seen for a total of 30 minutes in face-to-face genetic counseling.  Dr. Grayland Ormond was available for discussion regarding this case.   _______________________________________________________________________ For Office Staff:  Number of people involved in session: 1 Was an Intern/ student involved with case: no

## 2019-11-25 NOTE — Progress Notes (Signed)
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 Burnett.]   # [id number] Carbo-taxol-AC #1  # SURVIVORSHIP: Pending  # GENETICS: Pending  DIAGNOSIS: Right breast cancer triple negative  STAGE:   IIB      ;  GOALS: cure  CURRENT/MOST RECENT THERAPY : carbo-taxol-AC    Carcinoma of upper-inner quadrant of right breast in female, estrogen receptor negative ([place])  10/07/2019 Initial Diagnosis   Carcinoma of upper-inner quadrant of right breast in female, estrogen receptor negative ([place])      HISTORY OF PRESENTING ILLNESS:  Ashlee Cox 69 y.o.  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  Patient received cycle #3 of carboplatin-Taxol 1 week ago.  Patient is due for Taxol weekly today.  Patient complains of extreme fatigue.  She complains of "shortness of breath on exertion".  None at rest.  Complains her legs feel weak.  Continues to have tingling and numbness in extremities.  Not any worse.  Denies any pain.  No abdominal pain.  Patient had diarrhea-2 loose stools every day; also with nausea with episodes of vomiting needing antiemetics.  None today.  She had been eating poorly.  Review of Systems  Constitutional: Positive for malaise/fatigue. Negative for chills, diaphoresis, fever and weight loss.  HENT: Negative for nosebleeds and sore throat.   Eyes: Negative for double vision.  Respiratory: Positive for shortness of breath. Negative for cough, hemoptysis, sputum production and wheezing.   Cardiovascular: Negative for chest pain, palpitations, orthopnea and leg swelling.  Gastrointestinal: Positive for diarrhea, nausea and vomiting.  Negative for abdominal pain, blood in stool, constipation and melena.  Genitourinary: Negative for dysuria, frequency and urgency.  Musculoskeletal: Negative for back pain and joint pain.  Skin: Negative.  Negative for itching and rash.  Neurological: Positive for tingling. Negative for dizziness, focal weakness, weakness and headaches.  Endo/Heme/Allergies: Does not bruise/bleed easily.  Psychiatric/Behavioral: Negative for depression. The patient is not nervous/anxious and does not have insomnia.      MEDICAL HISTORY:  Past Medical History:  Diagnosis Date  . Cyst, breast    benign  . GERD (gastroesophageal reflux disease)   . H/O: rheumatic fever   . 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 Hyperlipidemia   . Hypertension   . Menopause    at age 69    SURGICAL HISTORY: Past Surgical History:  Procedure Laterality Date  . APPENDECTOMY  2006  . BREAST CYST ASPIRATION Left 1999  . CHOLECYSTECTOMY  2006  . PORTACATH PLACEMENT Left 10/06/2019   Procedure: INSERTION PORT-A-CATH;  Surgeon: Robert Bellow, MD;  Location: [facility] ORS;  Service: General;  Laterality: Left;  . SUBMUCOSAL TATTOO INJECTION Right 10/06/2019   Procedure: Right axillary TATTOO INJECTION;  Surgeon: Robert Bellow, MD;  Location: [facility] ORS;  Service: General;  Laterality: Right;  Marland Kitchen VAGINAL DELIVERY     x3    SOCIAL HISTORY: Social History   Socioeconomic History  . Marital status: Widowed    Spouse name: Not on file  . Number of children: Not on file  . Years of education: Not on file  . Highest education level: Not  on file  Occupational History  . Occupation: Glass blower/designer: [employer]  Tobacco Use  . Smoking status: Former Smoker    Years: 1.00    Types: Cigarettes    Quit date: 12/26/2005    Years since quitting: 13.9  . Smokeless tobacco: Never Used  . Tobacco comment: smoked for less than 1 years,  1 cig/day  Substance and Sexual  Activity  . Alcohol use: No  . Drug use: No  . Sexual activity: Never    Partners: Female  Other Topics Concern  . Not on file  Social History Narrative   Widowed, March 2014; lives with fiance.      [employer] retd; quit smoking 1ppw; no alcohol.       Social Determinants of Health   Financial Resource Strain:   . Difficulty of Paying Living Expenses: Not on file  Food Insecurity:   . Worried About Charity fundraiser in the Last Year: Not on file  . Ran Out of Food in the Last Year: Not on file  Transportation Needs:   . Lack of Transportation (Medical): Not on file  . Lack of Transportation (Non-Medical): Not on file  Physical Activity:   . Days of Exercise per Week: Not on file  . Minutes of Exercise per Session: Not on file  Stress:   . Feeling of Stress : Not on file  Social Connections:   . Frequency of Communication with Friends and Family: Not on file  . Frequency of Social Gatherings with Friends and Family: Not on file  . Attends Religious Services: Not on file  . Active Member of Clubs or Organizations: Not on file  . Attends Archivist Meetings: Not on file  . Marital Status: Not on file  Intimate Partner Violence:   . Fear of Current or Ex-Partner: Not on file  . Emotionally Abused: Not on file  . Physically Abused: Not on file  . Sexually Abused: Not on file    FAMILY HISTORY: Family History  Problem Relation Age of Onset  . Cancer Mother 88       ovarian- died 4-5 years.   . Heart disease Mother 104  . Diabetes Mother   . Stroke Father 32  . Diabetes Father   . Diabetes Sister   . Melanoma Sister        survived  . Breast cancer Neg Hx     ALLERGIES:  has No Known Allergies.  MEDICATIONS:  Current Outpatient Medications  Medication Sig Dispense Refill  . losartan-hydrochlorothiazide (HYZAAR) 50-12.5 MG tablet Take 1 tablet by mouth daily. 90 tablet 3  . lovastatin (MEVACOR) 40 MG tablet TAKE 1 TABLET BY MOUTH AT BEDTIME (Patient  taking differently: Take 40 mg by mouth in the morning. ) 90 tablet 3  . metFORMIN (GLUCOPHAGE) 500 MG tablet Take 2 tablets (1,000 mg total) by mouth 2 (two) times daily with a meal. 120 tablet 3  . Multiple Vitamin (MULTIVITAMIN) tablet Take 1 tablet by mouth daily.      Marland Kitchen omeprazole (PRILOSEC) 20 MG capsule Take 1 capsule by mouth once daily 90 capsule 0  . docusate sodium (COLACE) 100 MG capsule Take 100 mg by mouth daily. (Patient not taking: Reported on 10/27/2019)    . ondansetron (ZOFRAN) 8 MG tablet One pill every 8 hours as needed for nausea/vomitting. (Patient not taking: Reported on 10/27/2019) 40 tablet 1  . prochlorperazine (COMPAZINE) 10 MG tablet Take 1 tablet (10 mg total) by  mouth every 6 (six) hours as needed for nausea or vomiting. (Patient not taking: Reported on 10/27/2019) 40 tablet 1   No current facility-administered medications for this visit.      Marland Kitchen  PHYSICAL EXAMINATION: ECOG PERFORMANCE STATUS: 0 - Asymptomatic  Vitals:   11/25/19 0909  BP: (!) 159/83  Pulse: (!) 111  Temp: 97.8 F (36.6 C)  SpO2: 98%   Filed Weights   11/25/19 0909  Weight: 191 lb 12.8 oz (87 kg)    Physical Exam HENT:     Head: Normocephalic and atraumatic.     Mouth/Throat:     Pharynx: No oropharyngeal exudate.  Eyes:     Pupils: Pupils are equal, round, and reactive to light.  Cardiovascular:     Rate and Rhythm: Normal rate and regular rhythm.  Pulmonary:     Effort: No respiratory distress.     Breath sounds: No wheezing.  Abdominal:     General: Bowel sounds are normal. There is no distension.     Palpations: Abdomen is soft. There is no mass.     Tenderness: There is no abdominal tenderness. There is no guarding or rebound.  Musculoskeletal:        General: No tenderness. Normal range of motion.     Cervical back: Normal range of motion and neck supple.  Skin:    General: Skin is warm.     Comments: Right breast-right upper quadrant vague approximately 1 to 2 cm mass  felt.  No skin changes.  No nipple changes. (In presence of chaperone)   Neurological:     Mental Status: She is alert and oriented to person, place, and time.  Psychiatric:        Mood and Affect: Affect normal.      LABORATORY DATA:  I have reviewed the data as listed Lab Results  Component Value Date   WBC 3.8 (L) 11/25/2019   HGB 11.0 (L) 11/25/2019   HCT 30.4 (L) 11/25/2019   MCV 84.7 11/25/2019   PLT 264 11/25/2019   Recent Labs    11/04/19 0917 11/04/19 0917 11/11/19 0849 11/18/19 0823 11/25/19 0847  NA 139   < > 140 137 136  K 3.5   < > 3.5 3.4* 3.2*  CL 103   < > 104 102 101  CO2 24   < > 25 23 23   GLUCOSE 169*   < > 200* 176* 213*  BUN 17   < > 13 14 19   CREATININE 0.79   < > 0.75 0.87 0.88  CALCIUM 8.9   < > 8.9 8.7* 8.5*  GFRNONAA >60   < > >60 >60 >60  GFRAA >60   < > >60 >60 >60  PROT 7.0  --  6.6 6.8  --   ALBUMIN 4.0  --  3.8 4.1  --   AST 40  --  39 45*  --   ALT 50*  --  45* 47*  --   ALKPHOS 63  --  64 65  --   BILITOT 0.8  --  0.5 0.6  --    < > = values in this interval not displayed.    RADIOGRAPHIC STUDIES: I have personally reviewed the radiological images as listed and agreed with the findings in the report. No results found.  ASSESSMENT & PLAN:   Carcinoma of upper-inner quadrant of right breast in female, estrogen receptor negative ([place]) # Right breast cancer-Stage IIB- T2N1-TRIPLE NEGATIVE; PET July 11th-right breast mass  right underarm lymphadenopathy.  Partial response noted on physical exam.  Awaiting  mammogram/ultrasound in the next 2 weeks.  # HOLD Taxol chemo today; given acute issues [see below]; acute issues likely secondary to cycle #3 of carboplatin 1 week ago.  # Poor PO intake/diarrhea [2 a day]/sinus tachycardia-EKG sinus rhythm no concerns for PE oxygen saturation 90%- plan IVFs over 1 hour today; hold obstructive because of diabetes.  Reevaluate tomorrow.   # DM-post breakfast blood sugars 176; stable; on   metformin 1000 mg BID.   # Genetic counseling-awaiting genetics today;[mother with ovarian cancer Sister with melanoma]  # PN-1-2 sec to taxol/ Clinical trial- STABLE  # DISPOSITION: # HOLD chemo today; IVF 1 lit over 1 hour; # [place] tomorrow-posible  IVF 1 lit over 1 hour;  # 1 week- MD; labs- cbc/bmp;Taxol-  # keep other appt as planned-Dr.B     All questions were answered. The patient/family knows to call the clinic with any problems, questions or concerns.   Cammie Sickle, MD 11/25/2019 12:08 PM

## 2019-11-26 ENCOUNTER — Inpatient Hospital Stay: Payer: PPO | Attending: Oncology

## 2019-11-26 ENCOUNTER — Inpatient Hospital Stay: Payer: PPO | Attending: Oncology | Admitting: Oncology

## 2019-11-26 VITALS — BP 141/71 | HR 90 | Temp 98.9°F | Resp 18

## 2019-11-26 DIAGNOSIS — Z7984 Long term (current) use of oral hypoglycemic drugs: Secondary | ICD-10-CM | POA: Insufficient documentation

## 2019-11-26 DIAGNOSIS — Z006 Encounter for examination for normal comparison and control in clinical research program: Secondary | ICD-10-CM | POA: Insufficient documentation

## 2019-11-26 DIAGNOSIS — E878 Other disorders of electrolyte and fluid balance, not elsewhere classified: Secondary | ICD-10-CM | POA: Diagnosis not present

## 2019-11-26 DIAGNOSIS — E119 Type 2 diabetes mellitus without complications: Secondary | ICD-10-CM | POA: Insufficient documentation

## 2019-11-26 DIAGNOSIS — Z5111 Encounter for antineoplastic chemotherapy: Secondary | ICD-10-CM | POA: Insufficient documentation

## 2019-11-26 DIAGNOSIS — Z171 Estrogen receptor negative status [ER-]: Secondary | ICD-10-CM

## 2019-11-26 DIAGNOSIS — G629 Polyneuropathy, unspecified: Secondary | ICD-10-CM | POA: Diagnosis not present

## 2019-11-26 DIAGNOSIS — R197 Diarrhea, unspecified: Secondary | ICD-10-CM | POA: Insufficient documentation

## 2019-11-26 DIAGNOSIS — Z8041 Family history of malignant neoplasm of ovary: Secondary | ICD-10-CM | POA: Insufficient documentation

## 2019-11-26 DIAGNOSIS — C50211 Malignant neoplasm of upper-inner quadrant of right female breast: Secondary | ICD-10-CM

## 2019-11-26 DIAGNOSIS — R5383 Other fatigue: Secondary | ICD-10-CM | POA: Insufficient documentation

## 2019-11-26 DIAGNOSIS — C50911 Malignant neoplasm of unspecified site of right female breast: Secondary | ICD-10-CM | POA: Insufficient documentation

## 2019-11-26 DIAGNOSIS — Z87891 Personal history of nicotine dependence: Secondary | ICD-10-CM | POA: Insufficient documentation

## 2019-11-26 DIAGNOSIS — Z808 Family history of malignant neoplasm of other organs or systems: Secondary | ICD-10-CM | POA: Insufficient documentation

## 2019-11-26 MED ORDER — SODIUM CHLORIDE 0.9 % IV SOLN
Freq: Once | INTRAVENOUS | Status: AC
  Filled 2019-11-26: qty 250

## 2019-11-26 MED ORDER — ONDANSETRON HCL 4 MG/2ML IJ SOLN
8.0000 mg | Freq: Once | INTRAMUSCULAR | Status: AC
  Administered 2019-11-26: 8 mg via INTRAVENOUS
  Filled 2019-11-26: qty 4

## 2019-11-26 MED ORDER — HEPARIN SOD (PORK) LOCK FLUSH 100 UNIT/ML IV SOLN
500.0000 [IU] | Freq: Once | INTRAVENOUS | Status: AC | PRN
  Administered 2019-11-26: 500 [IU]
  Filled 2019-11-26: qty 5

## 2019-11-26 MED ORDER — SODIUM CHLORIDE 0.9% FLUSH
10.0000 mL | Freq: Once | INTRAVENOUS | Status: AC | PRN
  Administered 2019-11-26: 10 mL
  Filled 2019-11-26: qty 10

## 2019-11-26 MED ORDER — SODIUM CHLORIDE 0.9 % IV SOLN: Freq: Once | INTRAVENOUS | Status: DC

## 2019-11-26 NOTE — Progress Notes (Signed)
Re: Follow-up   Mrs. Ashlee Cox is is a 69 year old female with past medical history significant for obesity, hypertension and diabetes who was recently diagnosed with stage IIb triple negative breast cancer.  She is followed by Dr. Rogue Bussing and currently receiving chemotherapy with carbotaxol.  Treatment was held yesterday secondary to severe fatigue, electrolyte derangement, diarrhea and neuropathy.  She was given IV fluids and encouraged to come back today for additional fluids.  Today, she is feeling much better.  No additional lab work drawn today.  Proceed with IV fluids.  Follow-up as scheduled with Dr. Rogue Bussing.  Faythe Casa, NP 11/26/2019 10:46 AM

## 2019-11-26 NOTE — Progress Notes (Signed)
Pt feeling better today.Legs are feeling stronger. No dyspnea. Ate a good supper last night. Bowels moving. Rcvd 1 L of IVF's with supportive med today.

## 2019-11-28 ENCOUNTER — Encounter: Payer: Self-pay | Admitting: Internal Medicine

## 2019-11-28 NOTE — Progress Notes (Signed)
Patient denies any pain at this time. States she is feeling much better since last week. Breathing is better. Still has some heaviness in legs. Denies other questions or concerns at this time.

## 2019-12-02 ENCOUNTER — Inpatient Hospital Stay: Payer: PPO

## 2019-12-02 ENCOUNTER — Other Ambulatory Visit: Payer: Self-pay

## 2019-12-02 ENCOUNTER — Other Ambulatory Visit: Payer: PPO

## 2019-12-02 ENCOUNTER — Inpatient Hospital Stay: Payer: PPO | Admitting: Internal Medicine

## 2019-12-02 ENCOUNTER — Encounter: Payer: Self-pay | Admitting: Internal Medicine

## 2019-12-02 VITALS — BP 166/81 | HR 94 | Temp 98.9°F | Resp 16 | Ht 65.75 in | Wt 198.0 lb

## 2019-12-02 DIAGNOSIS — C50211 Malignant neoplasm of upper-inner quadrant of right female breast: Secondary | ICD-10-CM

## 2019-12-02 DIAGNOSIS — E119 Type 2 diabetes mellitus without complications: Secondary | ICD-10-CM | POA: Diagnosis not present

## 2019-12-02 DIAGNOSIS — Z5111 Encounter for antineoplastic chemotherapy: Secondary | ICD-10-CM | POA: Diagnosis present

## 2019-12-02 DIAGNOSIS — Z8041 Family history of malignant neoplasm of ovary: Secondary | ICD-10-CM

## 2019-12-02 DIAGNOSIS — C50911 Malignant neoplasm of unspecified site of right female breast: Secondary | ICD-10-CM

## 2019-12-02 DIAGNOSIS — D4861 Neoplasm of uncertain behavior of right breast: Secondary | ICD-10-CM

## 2019-12-02 DIAGNOSIS — Z171 Estrogen receptor negative status [ER-]: Secondary | ICD-10-CM

## 2019-12-02 DIAGNOSIS — Z87891 Personal history of nicotine dependence: Secondary | ICD-10-CM | POA: Diagnosis not present

## 2019-12-02 DIAGNOSIS — Z006 Encounter for examination for normal comparison and control in clinical research program: Secondary | ICD-10-CM

## 2019-12-02 DIAGNOSIS — Z808 Family history of malignant neoplasm of other organs or systems: Secondary | ICD-10-CM

## 2019-12-02 DIAGNOSIS — Z452 Encounter for adjustment and management of vascular access device: Secondary | ICD-10-CM

## 2019-12-02 DIAGNOSIS — R197 Diarrhea, unspecified: Secondary | ICD-10-CM | POA: Diagnosis not present

## 2019-12-02 DIAGNOSIS — Z7984 Long term (current) use of oral hypoglycemic drugs: Secondary | ICD-10-CM

## 2019-12-02 LAB — CBC WITH DIFFERENTIAL/PLATELET
Abs Immature Granulocytes: 0.08 10*3/uL — ABNORMAL HIGH (ref 0.00–0.07)
Basophils Absolute: 0 10*3/uL (ref 0.0–0.1)
Basophils Relative: 1 %
Eosinophils Absolute: 0.1 10*3/uL (ref 0.0–0.5)
Eosinophils Relative: 1 %
HCT: 30.5 % — ABNORMAL LOW (ref 36.0–46.0)
Hemoglobin: 10.4 g/dL — ABNORMAL LOW (ref 12.0–15.0)
Immature Granulocytes: 2 %
Lymphocytes Relative: 45 %
Lymphs Abs: 1.6 10*3/uL (ref 0.7–4.0)
MCH: 30.1 pg (ref 26.0–34.0)
MCHC: 34.1 g/dL (ref 30.0–36.0)
MCV: 88.4 fL (ref 80.0–100.0)
Monocytes Absolute: 0.5 10*3/uL (ref 0.1–1.0)
Monocytes Relative: 15 %
Neutro Abs: 1.3 10*3/uL — ABNORMAL LOW (ref 1.7–7.7)
Neutrophils Relative %: 36 %
Platelets: 291 10*3/uL (ref 150–400)
RBC: 3.45 MIL/uL — ABNORMAL LOW (ref 3.87–5.11)
RDW: 19.9 % — ABNORMAL HIGH (ref 11.5–15.5)
WBC: 3.6 10*3/uL — ABNORMAL LOW (ref 4.0–10.5)
nRBC: 0 % (ref 0.0–0.2)

## 2019-12-02 LAB — BASIC METABOLIC PANEL
Anion gap: 12 (ref 5–15)
BUN: 10 mg/dL (ref 8–23)
CO2: 26 mmol/L (ref 22–32)
Calcium: 9.1 mg/dL (ref 8.9–10.3)
Chloride: 106 mmol/L (ref 98–111)
Creatinine, Ser: 0.68 mg/dL (ref 0.44–1.00)
GFR calc Af Amer: >60 mL/min (ref >60)
GFR calc non Af Amer: >60 mL/min (ref >60)
Glucose, Bld: 194 mg/dL — ABNORMAL HIGH (ref 70–99)
Potassium: 3.4 mmol/L — ABNORMAL LOW (ref 3.5–5.1)
Sodium: 144 mmol/L (ref 135–145)

## 2019-12-02 MED ORDER — SODIUM CHLORIDE 0.9 % IV SOLN
20.0000 mg | Freq: Once | INTRAVENOUS | Status: AC
  Administered 2019-12-02: 20 mg via INTRAVENOUS
  Filled 2019-12-02: qty 20

## 2019-12-02 MED ORDER — SODIUM CHLORIDE 0.9 % IV SOLN
Freq: Once | INTRAVENOUS | Status: AC
  Filled 2019-12-02: qty 250

## 2019-12-02 MED ORDER — DIPHENHYDRAMINE HCL 50 MG/ML IJ SOLN
50.0000 mg | Freq: Once | INTRAMUSCULAR | Status: AC
  Administered 2019-12-02: 50 mg via INTRAVENOUS
  Filled 2019-12-02: qty 1

## 2019-12-02 MED ORDER — SODIUM CHLORIDE 0.9 % IV SOLN
80.0000 mg/m2 | Freq: Once | INTRAVENOUS | Status: AC
  Administered 2019-12-02: 162 mg via INTRAVENOUS
  Filled 2019-12-02: qty 27

## 2019-12-02 MED ORDER — HEPARIN SOD (PORK) LOCK FLUSH 100 UNIT/ML IV SOLN
500.0000 [IU] | Freq: Once | INTRAVENOUS | Status: DC | PRN
  Filled 2019-12-02: qty 5

## 2019-12-02 MED ORDER — SODIUM CHLORIDE 0.9% FLUSH
10.0000 mL | INTRAVENOUS | Status: DC | PRN
  Administered 2019-12-02: 10 mL via INTRAVENOUS
  Filled 2019-12-02: qty 10

## 2019-12-02 MED ORDER — FAMOTIDINE IN NACL 20-0.9 MG/50ML-% IV SOLN
20.0000 mg | Freq: Once | INTRAVENOUS | Status: AC
  Administered 2019-12-02: 20 mg via INTRAVENOUS
  Filled 2019-12-02: qty 50

## 2019-12-02 MED ORDER — HEPARIN SOD (PORK) LOCK FLUSH 100 UNIT/ML IV SOLN
500.0000 [IU] | Freq: Once | INTRAVENOUS | Status: AC
  Administered 2019-12-02: 500 [IU] via INTRAVENOUS
  Filled 2019-12-02: qty 5

## 2019-12-02 NOTE — Research (Addendum)
SWOG H4742 Protocol Week 8 Visit:   Patient in to the cancer center this am with her husband for her appointment. Central labs drawn from peripheral site in her left forearm as her port would not give enough blood at 0930 am per protocol requirements, vials in ice bath and delivered to the lab for processing. Patient states she does feel some tingling in her hands and feet but not any worse than before. Dr. Rogue Bussing examined the patient and completed the physician reported form and solicited neuropathy events form with dysthesia grade 1, paresthesia grade 1, and peripheral sensory neuropathy grade 1 being reported and symptom burden as a "2". 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 The neuropathy assessment was completed with the assistance of Yolande Jolly, RN without any difficulties. The patient could not feel the tuning fork at all to the patellar region of her right leg. The patient is to continue her current treatment per Dr. Agnes [name] assessment with no change in dosage or delays at this time. Dr. Rogue Bussing will schedule her to have a dye study of her port prior to her next treatment to ensure patency and prevent any issues in the future. The patient was provided her questionnaires to complete prior to her chemotherapy treatment with Taxol. She understands that the research RN will see her again at her 12 week assessment.   Deon Ivey [id number] 12/02/2019  Adverse Event Log  Study/Protocol: SWOG E3329 Cycle: 8 Weeks  Event Grade Onset Date Resolved Date Drug Name Attribution Treatment Comments  Dysesthesia 1 12/02/2019  Taxol 5    Neuralgia 0    0    Parasthesia 1 12/02/2019  Taxol 5    Peripheral motor neuropathy 0    0    Peripheral sensory neuropathy 1 12/02/2019  Taxol 5              Jeral Fruit, RN, BSN, OCN  12/02/2019 1051 am

## 2019-12-02 NOTE — Assessment & Plan Note (Addendum)
#   Right breast cancer-Stage IIB- T2N1-TRIPLE NEGATIVE; PET July 11th-right breast mass right underarm lymphadenopathy.  Partial response noted on physical exam.  Awaiting  Mammogram/ultrasound on 21st.   # proceed withTaxol chemo today; Labs today reviewed;  acceptable for treatment today.   # Diarrhea [4 times a day]- recommend immoidum prn.  Clinically not concerning for infectious etiology.  # DM-post breakfast blood sugars 194 stable on  metformin 1000 mg BID.   # Genetic counseling-awaiting genetics today;[mother with ovarian cancer Sister with melanoma]  # PN-1-2 sec to taxol/ Clinical trial-STABLE  #Port access issues-recommend dye study next visit.  # DISPOSITION: # dye study in prior to next chemo/1 week # chemo today-  # 1 week- MD; labs- cbc/bmp;Taxol-  # 2 weeks- MD; labs-cbc/cmp/Carbo-Taxol-Dr.B

## 2019-12-02 NOTE — Progress Notes (Signed)
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 Burnett.]   # [id number] Carbo-taxol-AC #1  # SURVIVORSHIP: Pending  # GENETICS: Pending  DIAGNOSIS: Right breast cancer triple negative  STAGE:   IIB      ;  GOALS: cure  CURRENT/MOST RECENT THERAPY : carbo-taxol-AC    Carcinoma of upper-inner quadrant of right breast in female, estrogen receptor negative ([place])  10/07/2019 Initial Diagnosis   Carcinoma of upper-inner quadrant of right breast in female, estrogen receptor negative ([place])      HISTORY OF PRESENTING ILLNESS:  Ashlee Cox 69 y.o.  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  Patient received cycle #3 of carboplatin-Taxol 2 weeks ago.  Patient's Taxol was held last week because of poor tolerance/extreme fatigue diarrhea.  Since holding chemotherapy last week-patient is clinically much improved.   Patient has continued loose stools intermittently up to 4 yesterday.  Patient has not been taking Imodium.  Denies any abdominal cramps.  Denies any fevers or chills.  No nausea no vomiting.   Review of Systems  Constitutional: Positive for malaise/fatigue. Negative for chills, diaphoresis, fever and weight loss.  HENT: Negative for nosebleeds and sore throat.   Eyes: Negative for double vision.  Respiratory: Negative for cough, hemoptysis, sputum production and wheezing.   Cardiovascular: Negative for chest pain, palpitations, orthopnea and leg swelling.  Gastrointestinal: Positive for diarrhea. Negative for abdominal pain, blood in stool, constipation and melena.  Genitourinary: Negative for dysuria, frequency and urgency.   Musculoskeletal: Negative for back pain and joint pain.  Skin: Negative.  Negative for itching and rash.  Neurological: Positive for tingling. Negative for dizziness, focal weakness, weakness and headaches.  Endo/Heme/Allergies: Does not bruise/bleed easily.  Psychiatric/Behavioral: Negative for depression. The patient is not nervous/anxious and does not have insomnia.      MEDICAL HISTORY:  Past Medical History:  Diagnosis Date  . Cyst, breast    benign  . Family history of melanoma   . Family history of ovarian cancer   . GERD (gastroesophageal reflux disease)   . H/O: rheumatic fever   . 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 Hyperlipidemia   . Hypertension   . Menopause    at age 64    SURGICAL HISTORY: Past Surgical History:  Procedure Laterality Date  . APPENDECTOMY  2006  . BREAST CYST ASPIRATION Left 1999  . CHOLECYSTECTOMY  2006  . PORTACATH PLACEMENT Left 10/06/2019   Procedure: INSERTION PORT-A-CATH;  Surgeon: Robert Bellow, MD;  Location: [facility] ORS;  Service: General;  Laterality: Left;  . SUBMUCOSAL TATTOO INJECTION Right 10/06/2019   Procedure: Right axillary TATTOO INJECTION;  Surgeon: Robert Bellow, MD;  Location: [facility] ORS;  Service: General;  Laterality: Right;  Marland Kitchen VAGINAL DELIVERY     x3    SOCIAL HISTORY: Social History   Socioeconomic History  . Marital status: Widowed    Spouse name: Not on file  . Number of children: Not on file  . Years of education: Not on file  . Highest education level: Not on file  Occupational History  . Occupation: Glass blower/designer: [employer]  Tobacco Use  . Smoking status: Former Smoker    Years: 1.00    Types: Cigarettes    Quit date: 12/26/2005    Years since quitting: 13.9  . Smokeless tobacco: Never Used  . Tobacco comment: smoked for less than 1 years,  1 cig/day  Substance and Sexual Activity  . Alcohol use: No  . Drug use: No  . Sexual  activity: Never    Partners: Female  Other Topics Concern  . Not on file  Social History Narrative   Widowed, March 2014; lives with fiance.      [employer] ret[employer]; quit smoking 1ppw; no alcohol.       Social Determinants of Health   Financial Resource Strain:   . Difficulty of Paying Living Expenses: Not on file  Food Insecurity:   . Worried About Charity fundraiser in the Last Year: Not on file  . Ran Out of Food in the Last Year: Not on file  Transportation Needs:   . Lack of Transportation (Medical): Not on file  . Lack of Transportation (Non-Medical): Not on file  Physical Activity:   . Days of Exercise per Week: Not on file  . Minutes of Exercise per Session: Not on file  Stress:   . Feeling of Stress : Not on file  Social Connections:   . Frequency of Communication with Friends and Family: Not on file  . Frequency of Social Gatherings with Friends and Family: Not on file  . Attends Religious Services: Not on file  . Active Member of Clubs or Organizations: Not on file  . Attends Archivist Meetings: Not on file  . Marital Status: Not on file  Intimate Partner Violence:   . Fear of Current or Ex-Partner: Not on file  . Emotionally Abused: Not on file  . Physically Abused: Not on file  . Sexually Abused: Not on file    FAMILY HISTORY: Family History  Problem Relation Age of Onset  . Cancer Mother 67       ovarian- died 4-5 years.   . Heart disease Mother 26  . Diabetes Mother   . Stroke Father 30  . Diabetes Father   . Diabetes Sister   . Melanoma Sister        survived  . Breast cancer Neg Hx     ALLERGIES:  has No Known Allergies.  MEDICATIONS:  Current Outpatient Medications  Medication Sig Dispense Refill  . losartan-hydrochlorothiazide (HYZAAR) 50-12.5 MG tablet Take 1 tablet by mouth daily. 90 tablet 3  . lovastatin (MEVACOR) 40 MG tablet TAKE 1 TABLET BY MOUTH AT BEDTIME (Patient taking differently: Take 40 mg by mouth in the morning. ) 90  tablet 3  . metFORMIN (GLUCOPHAGE) 500 MG tablet Take 2 tablets (1,000 mg total) by mouth 2 (two) times daily with a meal. 120 tablet 3  . Multiple Vitamin (MULTIVITAMIN) tablet Take 1 tablet by mouth daily.      Marland Kitchen omeprazole (PRILOSEC) 20 MG capsule Take 1 capsule by mouth once daily 90 capsule 0  . docusate sodium (COLACE) 100 MG capsule Take 100 mg by mouth daily. (Patient not taking: Reported on 10/27/2019)    . ondansetron (ZOFRAN) 8 MG tablet One pill every 8 hours as needed for nausea/vomitting. (Patient not taking: Reported on 10/27/2019) 40 tablet 1  . prochlorperazine (COMPAZINE) 10 MG tablet Take 1 tablet (10 mg total) by mouth every 6 (six) hours as needed for nausea or vomiting. (Patient not taking: Reported  on 10/27/2019) 40 tablet 1   No current facility-administered medications for this visit.   Facility-Administered Medications Ordered in Other Visits  Medication Dose Route Frequency Provider Last Rate Last Admin  . heparin lock flush 100 unit/mL  500 Units Intravenous Once Charlaine Dalton R, MD      . heparin lock flush 100 unit/mL  500 Units Intracatheter Once PRN Cammie Sickle, MD      . PACLitaxel (TAXOL) 162 mg in sodium chloride 0.9 % 250 mL chemo infusion (</= 80mg /m2)  80 mg/m2 (Treatment Plan Recorded) Intravenous Once Cammie Sickle, MD 277 mL/hr at 12/02/19 1135 162 mg at 12/02/19 1135  . sodium chloride flush (NS) 0.9 % injection 10 mL  10 mL Intravenous PRN Cammie Sickle, MD   10 mL at 12/02/19 0920      .  PHYSICAL EXAMINATION: ECOG PERFORMANCE STATUS: 0 - Asymptomatic  Vitals:   12/02/19 0946  BP: (!) 166/81  Pulse: 94  Resp: 16  Temp: 98.9 F (37.2 C)  SpO2: 98%   Filed Weights   12/02/19 0946  Weight: 198 lb (89.8 kg)    Physical Exam HENT:     Head: Normocephalic and atraumatic.     Mouth/Throat:     Pharynx: No oropharyngeal exudate.  Eyes:     Pupils: Pupils are equal, round, and reactive to light.  Cardiovascular:      Rate and Rhythm: Normal rate and regular rhythm.  Pulmonary:     Effort: No respiratory distress.     Breath sounds: No wheezing.  Abdominal:     General: Bowel sounds are normal. There is no distension.     Palpations: Abdomen is soft. There is no mass.     Tenderness: There is no abdominal tenderness. There is no guarding or rebound.  Musculoskeletal:        General: No tenderness. Normal range of motion.     Cervical back: Normal range of motion and neck supple.  Skin:    General: Skin is warm.     Comments: Right breast-right upper quadrant vague approximately 1 to 2 cm mass felt.  No skin changes.  No nipple changes. (In presence of chaperone)   Neurological:     Mental Status: She is alert and oriented to person, place, and time.  Psychiatric:        Mood and Affect: Affect normal.      LABORATORY DATA:  I have reviewed the data as listed Lab Results  Component Value Date   WBC 3.6 (L) 12/02/2019   HGB 10.4 (L) 12/02/2019   HCT 30.5 (L) 12/02/2019   MCV 88.4 12/02/2019   PLT 291 12/02/2019   Recent Labs    11/04/19 0917 11/04/19 0917 11/11/19 0849 11/11/19 0849 11/18/19 0823 11/25/19 0847 12/02/19 0922  NA 139   < > 140   < > 137 136 144  K 3.5   < > 3.5   < > 3.4* 3.2* 3.4*  CL 103   < > 104   < > 102 101 106  CO2 24   < > 25   < > 23 23 26   GLUCOSE 169*   < > 200*   < > 176* 213* 194*  BUN 17   < > 13   < > 14 19 10   CREATININE 0.79   < > 0.75   < > 0.87 0.88 0.68  CALCIUM 8.9   < > 8.9   < > 8.7* 8.5*  9.1  GFRNONAA >60   < > >60   < > >60 >60 >60  GFRAA >60   < > >60   < > >60 >60 >60  PROT 7.0  --  6.6  --  6.8  --   --   ALBUMIN 4.0  --  3.8  --  4.1  --   --   AST 40  --  39  --  45*  --   --   ALT 50*  --  45*  --  47*  --   --   ALKPHOS 63  --  64  --  65  --   --   BILITOT 0.8  --  0.5  --  0.6  --   --    < > = values in this interval not displayed.    RADIOGRAPHIC STUDIES: I have personally reviewed the radiological images as listed  and agreed with the findings in the report. No results found.  ASSESSMENT & PLAN:   Carcinoma of upper-inner quadrant of right breast in female, estrogen receptor negative ([place]) # Right breast cancer-Stage IIB- T2N1-TRIPLE NEGATIVE; PET July 11th-right breast mass right underarm lymphadenopathy.  Partial response noted on physical exam.  Awaiting  Mammogram/ultrasound on 21st.   # proceed withTaxol chemo today; Labs today reviewed;  acceptable for treatment today.   # Diarrhea [4 times a day]- recommend immoidum prn.  Clinically not concerning for infectious etiology.  # DM-post breakfast blood sugars 194 stable on  metformin 1000 mg BID.   # Genetic counseling-awaiting genetics today;[mother with ovarian cancer Sister with melanoma]  # PN-1-2 sec to taxol/ Clinical trial-STABLE  #Port access issues-recommend dye study next visit.  # DISPOSITION: # dye study in prior to next chemo/1 week # chemo today-  # 1 week- MD; labs- cbc/bmp;Taxol-  # 2 weeks- MD; labs-cbc/cmp/Carbo-Taxol-Dr.B     All questions were answered. The patient/family knows to call the clinic with any problems, questions or concerns.   Cammie Sickle, MD 12/02/2019 12:27 PM

## 2019-12-02 NOTE — Progress Notes (Signed)
Per Dr. Rogue Bussing okay to use port for treatment, dye study to be performed prior to next treatment. Port flushes without difficulty, no pain, redness or swelling noted, little blood return noted.  Per Dr. Rogue Bussing okay to proceed with Taxol treatment with B/P 166/81 and ANC of 1.3 and with BMP, no CMP needed at this time.

## 2019-12-04 ENCOUNTER — Other Ambulatory Visit: Payer: Self-pay

## 2019-12-04 ENCOUNTER — Ambulatory Visit
Admission: RE | Admit: 2019-12-04 | Discharge: 2019-12-04 | Disposition: A | Discharge status: Home / Self Care | Payer: PPO | Source: Ambulatory Visit | Attending: Internal Medicine | Admitting: Internal Medicine

## 2019-12-04 ENCOUNTER — Telehealth: Payer: Self-pay | Admitting: Licensed Clinical Social Worker

## 2019-12-04 DIAGNOSIS — T82828A Fibrosis of vascular prosthetic devices, implants and grafts, initial encounter: Secondary | ICD-10-CM | POA: Diagnosis not present

## 2019-12-04 DIAGNOSIS — Z171 Estrogen receptor negative status [ER-]: Secondary | ICD-10-CM | POA: Diagnosis not present

## 2019-12-04 DIAGNOSIS — Z452 Encounter for adjustment and management of vascular access device: Secondary | ICD-10-CM | POA: Insufficient documentation

## 2019-12-04 DIAGNOSIS — C50211 Malignant neoplasm of upper-inner quadrant of right female breast: Secondary | ICD-10-CM

## 2019-12-04 MED ORDER — HEPARIN SOD (PORK) LOCK FLUSH 100 UNIT/ML IV SOLN
500.0000 [IU] | Freq: Once | INTRAVENOUS | Status: AC
  Administered 2019-12-04: 500 [IU]
  Filled 2019-12-04: qty 5

## 2019-12-04 MED ORDER — IOPAMIDOL (ISOVUE-300) INJECTION 61%
15.0000 mL | Freq: Once | INTRAVENOUS | Status: AC | PRN
  Administered 2019-12-04: 15 mL

## 2019-12-04 MED ORDER — HEPARIN SOD (PORK) LOCK FLUSH 100 UNIT/ML IV SOLN
INTRAVENOUS | Status: AC
  Filled 2019-12-04: qty 5

## 2019-12-04 NOTE — Telephone Encounter (Signed)
Revealed negative genetic testing.   We discussed that we do not know why she has breast cancer or why there is cancer in the family. It could be due to a different gene that we are not testing, or something our current technology cannot pick up.  It will be important for her to keep in contact with genetics to learn if additional testing may be needed in the future.  

## 2019-12-04 NOTE — Research (Signed)
[id number] 8 week visit central labs consisting of 6 plasma cryovials and 6 serum cryovials frozen, shipped to the [facility] via Fed[employer] priority overnight in FedEx frozen specimen box on dry ice. FedEx tracking number- [id number], following dangerous goods transport regulations. Jeral Fruit, RN, BSN, OCN Date: 12/04/2019 Time: 1030 am

## 2019-12-05 ENCOUNTER — Telehealth: Payer: Self-pay | Admitting: *Deleted

## 2019-12-05 ENCOUNTER — Ambulatory Visit: Payer: Self-pay | Admitting: Licensed Clinical Social Worker

## 2019-12-05 ENCOUNTER — Encounter: Payer: Self-pay | Admitting: Licensed Clinical Social Worker

## 2019-12-05 DIAGNOSIS — Z808 Family history of malignant neoplasm of other organs or systems: Secondary | ICD-10-CM

## 2019-12-05 DIAGNOSIS — Z171 Estrogen receptor negative status [ER-]: Secondary | ICD-10-CM

## 2019-12-05 DIAGNOSIS — Z1379 Encounter for other screening for genetic and chromosomal anomalies: Secondary | ICD-10-CM | POA: Insufficient documentation

## 2019-12-05 DIAGNOSIS — C50211 Malignant neoplasm of upper-inner quadrant of right female breast: Secondary | ICD-10-CM

## 2019-12-05 DIAGNOSIS — Z8041 Family history of malignant neoplasm of ovary: Secondary | ICD-10-CM

## 2019-12-05 NOTE — Telephone Encounter (Signed)
-----   Message from Cammie Sickle, MD sent at 12/05/2019 11:06 AM EDT ----- Ayren Zumbro-Will need TPA for the port- please have that scheduled. Thanks GB

## 2019-12-05 NOTE — Progress Notes (Signed)
HPI:  Ms. Lyter was previously seen in the [place] clinic due to a personal and family history of cancer and concerns regarding a hereditary predisposition to cancer. Please refer to our prior cancer genetics clinic note for more information regarding our discussion, assessment and recommendations, at the time. Ms. Vanderslice recent genetic test results were disclosed to her, as were recommendations warranted by these results. These results and recommendations are discussed in more detail below.  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 Burnett.]   # [id number] Carbo-taxol-AC #1  # SURVIVORSHIP: Pending  # GENETICS: Pending  DIAGNOSIS: Right breast cancer triple negative  STAGE:   IIB      ;  GOALS: cure  CURRENT/MOST RECENT THERAPY : carbo-taxol-AC    Carcinoma of upper-inner quadrant of right breast in female, estrogen receptor negative ([place])  10/07/2019 Initial Diagnosis   Carcinoma of upper-inner quadrant of right breast in female, estrogen receptor negative ([place])   12/04/2019 Genetic Testing   Negative genetic testing. No pathogenic variants identified on the [facility] Common Hereditary Cancers Panel + Skin Cancers Panel. The report date is 12/04/2019.  The Common Hereditary Cancers Panel + Skin Cancers Panel offered by [facility] includes sequencing and/or deletion duplication testing of the following 54 genes: APC*, ATM*, AXIN2, BAP1, BARD1, BMPR1A, BRCA1, BRCA2, BRIP1, CDH1, CDK4, CDKN2A (p14ARF), CDKN2A (p16INK4a), CHEK2, CTNNA1, DICER1*, EPCAM*, GREM1*, HOXB13, KIT, MEN1*, MITF*, MLH1*, MSH2*, MSH3*, MSH6*, MUTYH, NBN, NF1*, NTHL1, PALB2, PDGFRA, PMS2*, POLD1*, POLE, POT1, PTCH1, PTEN*, RAD50, RAD51C, RAD51D, RB1*, RNF43, SDHA*, SDHB, SDHC*, SDHD, SMAD4, SMARCA4, STK11, SUFU, TP53, TSC1*, TSC2, VHL.      FAMILY HISTORY:  We obtained a detailed, 4-generation family history.  Significant diagnoses  are listed below: Family History  Problem Relation Age of Onset  . Cancer Mother 68       ovarian- died 4-5 years.   . Heart disease Mother 18  . Diabetes Mother   . Stroke Father 70  . Diabetes Father   . Diabetes Sister   . Melanoma Sister        survived  . Breast cancer Neg Hx     Ms. Lupi has 2 daughters, ages 52 and 60 and a son, 75. None have had cancer. One of her daughters, Altha Harm, had negative genetic testing in the past. Patient has two sisters. One sister had melanoma removed from her arm, she had lots of sun exposure. Her other sister had total hysterectomy due to tumors on her ovaries.  Ms. Driskill mother had ovarian cancer when she was 16 and died at 49. Patient had 2 maternal aunts and 1 maternal uncle. One of her aunts may have had cancer. No known cancers in maternal cousins. Maternal grandparents both had skin cancers, they passed in their 43s.  Ms. Tippy father died at 51. Patient had 1 paternal aunt, no cancers. She is unaware of her paternal cousins' health history. Paternal grandparents passed in their 77s-80s.  Ms. Kosiba is aware of previous family history of genetic testing for hereditary cancer risks. Patient's maternal ancestors are of unknown descent, and paternal ancestors are of unknown descent. There is no reported Ashkenazi Jewish ancestry. There is no known consanguinity.    GENETIC TEST RESULTS: Genetic testing reported out on 12/04/2019 through the [facility] Common Hereditary Cancers Panel + Skin cancer panel found no pathogenic mutations.   The Common Hereditary Cancers Panel + Skin Cancers Panel offered by [facility] includes sequencing and/or  deletion duplication testing of the following 54 genes: APC*, ATM*, AXIN2, BAP1, BARD1, BMPR1A, BRCA1, BRCA2, BRIP1, CDH1, CDK4, CDKN2A (p14ARF), CDKN2A (p16INK4a), CHEK2, CTNNA1, DICER1*, EPCAM*, GREM1*, HOXB13, KIT, MEN1*, MITF*, MLH1*, MSH2*, MSH3*, MSH6*, MUTYH, NBN, NF1*, NTHL1, PALB2,  PDGFRA, PMS2*, POLD1*, POLE, POT1, PTCH1, PTEN*, RAD50, RAD51C, RAD51D, RB1*, RNF43, SDHA*, SDHB, SDHC*, SDHD, SMAD4, SMARCA4, STK11, SUFU, TP53, TSC1*, TSC2, VHL.   The test report has been scanned into EPIC and is located under the Molecular Pathology section of the Results Review tab.  A portion of the result report is included below for reference.     We discussed with Ms. Cassel that because current genetic testing is not perfect, it is possible there may be a gene mutation in one of these genes that current testing cannot detect, but that chance is small.  We also discussed, that there could be another gene that has not yet been discovered, or that we have not yet tested, that is responsible for the cancer diagnoses in the family. It is also possible there is a hereditary cause for the cancer in the family that Ms. Poma did not inherit and therefore was not identified in her testing.  Therefore, it is important to remain in touch with cancer genetics in the future so that we can continue to offer Ms. Weihe the most up to date genetic testing.   ADDITIONAL GENETIC TESTING: We discussed with Ms. Desanctis that her genetic testing was fairly extensive.  If there are genes identified to increase cancer risk that can be analyzed in the future, we would be happy to discuss and coordinate this testing at that time.    CANCER SCREENING RECOMMENDATIONS: Ms. Shiffer test result is considered negative (normal).  This means that we have not identified a hereditary cause for her personal and family history of cancer at this time. Most cancers happen by chance and this negative test suggests that her cancer may fall into this category.    While reassuring, this does not definitively rule out a hereditary predisposition to cancer. It is still possible that there could be genetic mutations that are undetectable by current technology. There could be genetic mutations in genes that have not been  tested or identified to increase cancer risk.  Therefore, it is recommended she continue to follow the cancer management and screening guidelines provided by her oncology and primary healthcare provider.   An individual's cancer risk and medical management are not determined by genetic test results alone. Overall cancer risk assessment incorporates additional factors, including personal medical history, family history, and any available genetic information that may result in a personalized plan for cancer prevention and surveillance.  RECOMMENDATIONS FOR FAMILY MEMBERS:  Relatives in this family might be at some increased risk of developing cancer, over the general population risk, simply due to the family history of cancer.  We recommended female relatives in this family have a yearly mammogram beginning at age 86, or 49 years younger than the earliest onset of cancer, an annual clinical breast exam, and perform monthly breast self-exams. Female relatives in this family should also have a gynecological exam as recommended by their primary provider.  All family members should be referred for colonoscopy starting at age 74.   It is also possible there is a hereditary cause for the cancer in Ms. Hensarling's family that she did not inherit and therefore was not identified in her.  Based on Ms. Leet's family history, we recommended maternal relatives have genetic counseling and  testing. Ms. Federici will let us know if we can be of any assistance in coordinating genetic counseling and/or testing for these family members.  FOLLOW-UP: Lastly, we discussed with Ms. Mcauley that cancer genetics is a rapidly advancing field and it is possible that new genetic tests will be appropriate for her and/or her family members in the future. We encouraged her to remain in contact with cancer genetics on an annual basis so we can update her personal and family histories and let her know of advances in cancer genetics  that may benefit this family.   Our contact number was provided. Ms. Reasons questions were answered to her satisfaction, and she knows she is welcome to call us at anytime with additional questions or concerns.   Faith Rogue, MS, [facility] Genetic Counselor [email or web address] Phone: [phone]

## 2019-12-05 NOTE — Telephone Encounter (Signed)
Patient notified that she has a fibrin sheath in port a cath. Patient educated that Dr. Rogue Bussing will prescribe TPA at her next apt. Goals to have a + blood return from port. She understands that her apt time has been extended to allow time to give TPA. She gave verbal understanding of the plan of care.

## 2019-12-09 ENCOUNTER — Ambulatory Visit: Payer: PPO | Admitting: Internal Medicine

## 2019-12-09 ENCOUNTER — Other Ambulatory Visit: Payer: Self-pay

## 2019-12-09 ENCOUNTER — Inpatient Hospital Stay: Payer: PPO

## 2019-12-09 VITALS — BP 162/89 | HR 91 | Temp 95.3°F | Resp 20 | Wt 196.0 lb

## 2019-12-09 DIAGNOSIS — D4861 Neoplasm of uncertain behavior of right breast: Secondary | ICD-10-CM

## 2019-12-09 DIAGNOSIS — Z87891 Personal history of nicotine dependence: Secondary | ICD-10-CM | POA: Diagnosis not present

## 2019-12-09 DIAGNOSIS — R197 Diarrhea, unspecified: Secondary | ICD-10-CM | POA: Diagnosis not present

## 2019-12-09 DIAGNOSIS — Z006 Encounter for examination for normal comparison and control in clinical research program: Secondary | ICD-10-CM | POA: Diagnosis present

## 2019-12-09 DIAGNOSIS — Z171 Estrogen receptor negative status [ER-]: Secondary | ICD-10-CM | POA: Diagnosis not present

## 2019-12-09 DIAGNOSIS — E119 Type 2 diabetes mellitus without complications: Secondary | ICD-10-CM | POA: Diagnosis not present

## 2019-12-09 DIAGNOSIS — C50211 Malignant neoplasm of upper-inner quadrant of right female breast: Secondary | ICD-10-CM

## 2019-12-09 DIAGNOSIS — Z5111 Encounter for antineoplastic chemotherapy: Secondary | ICD-10-CM | POA: Diagnosis not present

## 2019-12-09 DIAGNOSIS — Z808 Family history of malignant neoplasm of other organs or systems: Secondary | ICD-10-CM | POA: Diagnosis not present

## 2019-12-09 DIAGNOSIS — Z8041 Family history of malignant neoplasm of ovary: Secondary | ICD-10-CM | POA: Diagnosis not present

## 2019-12-09 DIAGNOSIS — C50911 Malignant neoplasm of unspecified site of right female breast: Secondary | ICD-10-CM | POA: Diagnosis not present

## 2019-12-09 DIAGNOSIS — Z7984 Long term (current) use of oral hypoglycemic drugs: Secondary | ICD-10-CM | POA: Diagnosis not present

## 2019-12-09 LAB — CBC WITH DIFFERENTIAL/PLATELET
Abs Immature Granulocytes: 0.62 10*3/uL — ABNORMAL HIGH (ref 0.00–0.07)
Basophils Absolute: 0.1 10*3/uL (ref 0.0–0.1)
Basophils Relative: 2 %
Eosinophils Absolute: 0.1 10*3/uL (ref 0.0–0.5)
Eosinophils Relative: 1 %
HCT: 30.7 % — ABNORMAL LOW (ref 36.0–46.0)
Hemoglobin: 10.7 g/dL — ABNORMAL LOW (ref 12.0–15.0)
Immature Granulocytes: 8 %
Lymphocytes Relative: 26 %
Lymphs Abs: 2.1 10*3/uL (ref 0.7–4.0)
MCH: 30.8 pg (ref 26.0–34.0)
MCHC: 34.9 g/dL (ref 30.0–36.0)
MCV: 88.5 fL (ref 80.0–100.0)
Monocytes Absolute: 0.7 10*3/uL (ref 0.1–1.0)
Monocytes Relative: 9 %
Neutro Abs: 4.3 10*3/uL (ref 1.7–7.7)
Neutrophils Relative %: 54 %
Platelets: 237 10*3/uL (ref 150–400)
RBC: 3.47 MIL/uL — ABNORMAL LOW (ref 3.87–5.11)
RDW: 18.9 % — ABNORMAL HIGH (ref 11.5–15.5)
WBC: 7.9 10*3/uL (ref 4.0–10.5)
nRBC: 1.3 % — ABNORMAL HIGH (ref 0.0–0.2)

## 2019-12-09 LAB — BASIC METABOLIC PANEL
Anion gap: 13 (ref 5–15)
BUN: 13 mg/dL (ref 8–23)
CO2: 23 mmol/L (ref 22–32)
Calcium: 8.9 mg/dL (ref 8.9–10.3)
Chloride: 104 mmol/L (ref 98–111)
Creatinine, Ser: 0.79 mg/dL (ref 0.44–1.00)
GFR calc Af Amer: >60 mL/min (ref >60)
GFR calc non Af Amer: >60 mL/min (ref >60)
Glucose, Bld: 204 mg/dL — ABNORMAL HIGH (ref 70–99)
Potassium: 3.3 mmol/L — ABNORMAL LOW (ref 3.5–5.1)
Sodium: 140 mmol/L (ref 135–145)

## 2019-12-09 MED ORDER — SODIUM CHLORIDE 0.9 % IV SOLN
80.0000 mg/m2 | Freq: Once | INTRAVENOUS | Status: AC
  Administered 2019-12-09: 162 mg via INTRAVENOUS
  Filled 2019-12-09: qty 27

## 2019-12-09 MED ORDER — FAMOTIDINE IN NACL 20-0.9 MG/50ML-% IV SOLN
20.0000 mg | Freq: Once | INTRAVENOUS | Status: AC
  Administered 2019-12-09: 20 mg via INTRAVENOUS
  Filled 2019-12-09: qty 50

## 2019-12-09 MED ORDER — HEPARIN SOD (PORK) LOCK FLUSH 100 UNIT/ML IV SOLN
500.0000 [IU] | Freq: Once | INTRAVENOUS | Status: AC | PRN
  Administered 2019-12-09: 500 [IU]
  Filled 2019-12-09: qty 5

## 2019-12-09 MED ORDER — DIPHENHYDRAMINE HCL 50 MG/ML IJ SOLN
50.0000 mg | Freq: Once | INTRAMUSCULAR | Status: AC
  Administered 2019-12-09: 50 mg via INTRAVENOUS
  Filled 2019-12-09: qty 1

## 2019-12-09 MED ORDER — SODIUM CHLORIDE 0.9 % IV SOLN
20.0000 mg | Freq: Once | INTRAVENOUS | Status: AC
  Administered 2019-12-09: 20 mg via INTRAVENOUS
  Filled 2019-12-09: qty 20

## 2019-12-09 MED ORDER — SODIUM CHLORIDE 0.9 % IV SOLN
Freq: Once | INTRAVENOUS | Status: AC
  Filled 2019-12-09: qty 250

## 2019-12-15 ENCOUNTER — Ambulatory Visit: Payer: PPO | Admitting: Internal Medicine

## 2019-12-16 ENCOUNTER — Other Ambulatory Visit: Payer: PPO

## 2019-12-19 ENCOUNTER — Ambulatory Visit
Admission: RE | Admit: 2019-12-19 | Discharge: 2019-12-19 | Disposition: A | Discharge status: Home / Self Care | Payer: PPO | Source: Ambulatory Visit | Attending: Internal Medicine | Admitting: Internal Medicine

## 2019-12-19 ENCOUNTER — Other Ambulatory Visit: Payer: Self-pay

## 2019-12-19 DIAGNOSIS — Z171 Estrogen receptor negative status [ER-]: Secondary | ICD-10-CM | POA: Diagnosis not present

## 2019-12-19 DIAGNOSIS — C50211 Malignant neoplasm of upper-inner quadrant of right female breast: Secondary | ICD-10-CM

## 2019-12-19 DIAGNOSIS — N6311 Unspecified lump in the right breast, upper outer quadrant: Secondary | ICD-10-CM | POA: Diagnosis not present

## 2019-12-19 DIAGNOSIS — R922 Inconclusive mammogram: Secondary | ICD-10-CM | POA: Diagnosis not present

## 2019-12-22 ENCOUNTER — Other Ambulatory Visit: Payer: Self-pay

## 2019-12-22 DIAGNOSIS — Z171 Estrogen receptor negative status [ER-]: Secondary | ICD-10-CM

## 2019-12-22 DIAGNOSIS — C50211 Malignant neoplasm of upper-inner quadrant of right female breast: Secondary | ICD-10-CM

## 2019-12-23 ENCOUNTER — Inpatient Hospital Stay: Payer: PPO

## 2019-12-23 ENCOUNTER — Other Ambulatory Visit: Payer: Self-pay

## 2019-12-23 ENCOUNTER — Encounter: Payer: Self-pay | Admitting: Internal Medicine

## 2019-12-23 ENCOUNTER — Inpatient Hospital Stay: Payer: PPO | Admitting: Internal Medicine

## 2019-12-23 VITALS — BP 149/75 | HR 102 | Temp 98.8°F | Resp 16 | Ht 65.75 in | Wt 197.6 lb

## 2019-12-23 DIAGNOSIS — E119 Type 2 diabetes mellitus without complications: Secondary | ICD-10-CM | POA: Diagnosis not present

## 2019-12-23 DIAGNOSIS — C50211 Malignant neoplasm of upper-inner quadrant of right female breast: Secondary | ICD-10-CM

## 2019-12-23 DIAGNOSIS — Z8041 Family history of malignant neoplasm of ovary: Secondary | ICD-10-CM

## 2019-12-23 DIAGNOSIS — Z808 Family history of malignant neoplasm of other organs or systems: Secondary | ICD-10-CM

## 2019-12-23 DIAGNOSIS — Z7984 Long term (current) use of oral hypoglycemic drugs: Secondary | ICD-10-CM

## 2019-12-23 DIAGNOSIS — Z171 Estrogen receptor negative status [ER-]: Secondary | ICD-10-CM

## 2019-12-23 DIAGNOSIS — D4861 Neoplasm of uncertain behavior of right breast: Secondary | ICD-10-CM

## 2019-12-23 DIAGNOSIS — Z006 Encounter for examination for normal comparison and control in clinical research program: Secondary | ICD-10-CM | POA: Diagnosis not present

## 2019-12-23 LAB — CBC WITH DIFFERENTIAL/PLATELET
Abs Immature Granulocytes: 0.04 10*3/uL (ref 0.00–0.07)
Basophils Absolute: 0.1 10*3/uL (ref 0.0–0.1)
Basophils Relative: 1 %
Eosinophils Absolute: 0.1 10*3/uL (ref 0.0–0.5)
Eosinophils Relative: 2 %
HCT: 31.7 % — ABNORMAL LOW (ref 36.0–46.0)
Hemoglobin: 10.7 g/dL — ABNORMAL LOW (ref 12.0–15.0)
Immature Granulocytes: 1 %
Lymphocytes Relative: 30 %
Lymphs Abs: 1.7 10*3/uL (ref 0.7–4.0)
MCH: 30.8 pg (ref 26.0–34.0)
MCHC: 33.8 g/dL (ref 30.0–36.0)
MCV: 91.4 fL (ref 80.0–100.0)
Monocytes Absolute: 0.5 10*3/uL (ref 0.1–1.0)
Monocytes Relative: 9 %
Neutro Abs: 3.3 10*3/uL (ref 1.7–7.7)
Neutrophils Relative %: 57 %
Platelets: 323 10*3/uL (ref 150–400)
RBC: 3.47 MIL/uL — ABNORMAL LOW (ref 3.87–5.11)
RDW: 18.7 % — ABNORMAL HIGH (ref 11.5–15.5)
WBC: 5.8 10*3/uL (ref 4.0–10.5)
nRBC: 0 % (ref 0.0–0.2)

## 2019-12-23 LAB — COMPREHENSIVE METABOLIC PANEL
ALT: 43 U/L (ref 0–44)
AST: 56 U/L — ABNORMAL HIGH (ref 15–41)
Albumin: 3.8 g/dL (ref 3.5–5.0)
Alkaline Phosphatase: 66 U/L (ref 38–126)
Anion gap: 12 (ref 5–15)
BUN: 10 mg/dL (ref 8–23)
CO2: 24 mmol/L (ref 22–32)
Calcium: 9.1 mg/dL (ref 8.9–10.3)
Chloride: 104 mmol/L (ref 98–111)
Creatinine, Ser: 0.77 mg/dL (ref 0.44–1.00)
GFR calc Af Amer: >60 mL/min (ref >60)
GFR calc non Af Amer: >60 mL/min (ref >60)
Glucose, Bld: 237 mg/dL — ABNORMAL HIGH (ref 70–99)
Potassium: 3.3 mmol/L — ABNORMAL LOW (ref 3.5–5.1)
Sodium: 140 mmol/L (ref 135–145)
Total Bilirubin: 0.6 mg/dL (ref 0.3–1.2)
Total Protein: 6.7 g/dL (ref 6.5–8.1)

## 2019-12-23 MED ORDER — SODIUM CHLORIDE 0.9 % IV SOLN
10.0000 mg | Freq: Once | INTRAVENOUS | Status: AC
  Administered 2019-12-23: 10 mg via INTRAVENOUS
  Filled 2019-12-23: qty 10

## 2019-12-23 MED ORDER — SODIUM CHLORIDE 0.9 % IV SOLN
510.0000 mg | Freq: Once | INTRAVENOUS | Status: AC
  Administered 2019-12-23: 510 mg via INTRAVENOUS
  Filled 2019-12-23: qty 51

## 2019-12-23 MED ORDER — SODIUM CHLORIDE 0.9 % IV SOLN
80.0000 mg/m2 | Freq: Once | INTRAVENOUS | Status: AC
  Administered 2019-12-23: 162 mg via INTRAVENOUS
  Filled 2019-12-23: qty 27

## 2019-12-23 MED ORDER — HEPARIN SOD (PORK) LOCK FLUSH 100 UNIT/ML IV SOLN
500.0000 [IU] | Freq: Once | INTRAVENOUS | Status: AC | PRN
  Administered 2019-12-23: 500 [IU]
  Filled 2019-12-23: qty 5

## 2019-12-23 MED ORDER — DIPHENHYDRAMINE HCL 50 MG/ML IJ SOLN
50.0000 mg | Freq: Once | INTRAMUSCULAR | Status: AC
  Administered 2019-12-23: 50 mg via INTRAVENOUS
  Filled 2019-12-23: qty 1

## 2019-12-23 MED ORDER — SODIUM CHLORIDE 0.9% FLUSH
10.0000 mL | Freq: Once | INTRAVENOUS | Status: AC
  Administered 2019-12-23: 10 mL via INTRAVENOUS
  Filled 2019-12-23: qty 10

## 2019-12-23 MED ORDER — SODIUM CHLORIDE 0.9 % IV SOLN
Freq: Once | INTRAVENOUS | Status: AC
  Filled 2019-12-23: qty 250

## 2019-12-23 MED ORDER — PALONOSETRON HCL INJECTION 0.25 MG/5ML
0.2500 mg | Freq: Once | INTRAVENOUS | Status: AC
  Administered 2019-12-23: 0.25 mg via INTRAVENOUS
  Filled 2019-12-23: qty 5

## 2019-12-23 MED ORDER — SODIUM CHLORIDE 0.9 % IV SOLN
150.0000 mg | Freq: Once | INTRAVENOUS | Status: AC
  Administered 2019-12-23: 150 mg via INTRAVENOUS
  Filled 2019-12-23: qty 150

## 2019-12-23 MED ORDER — FAMOTIDINE IN NACL 20-0.9 MG/50ML-% IV SOLN
20.0000 mg | Freq: Once | INTRAVENOUS | Status: AC
  Administered 2019-12-23: 20 mg via INTRAVENOUS
  Filled 2019-12-23: qty 50

## 2019-12-23 NOTE — Progress Notes (Signed)
HR 102 ok to proceed per MD °

## 2019-12-23 NOTE — Research (Signed)
SWOG I2641 12 Week Visit:  Patient in to the cancer center accompanied by her husband this morning. She is very excited with the results from her recent mammogram, states the mass is gone and she can't feel it anymore. Escorted to the infusion room by Baxter Flattery, RN for her port flush and lab draw. Research central labs were drawn in red and green blood tubes per protocol requirements at 0852 am, inverted 10 times each and placed in ice. The blood was delivered to [facility] in the laboratory with instructions provided for processing. The patient is completing her questionnaires prior to seeing Dr. Rogue Bussing.  Jeral Fruit, RN, BSN, OCN Date: 12/23/2019 Time: 0905 am   Patient in exam room 17 to see Dr. Rogue Bussing with husband present. Patient returned her completed questionnaires to the research RN. Dr. Rogue Bussing examined patient and completed her neuropathy adverse event form and toxicity burden assessment. 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 The neuropathy assessment was completed by this nurse and Yolande Jolly, RN per protocol requirements. The patient could not feel any sharpness when using the neurotip to the outside of her right foot at all, there was no sensation to the tuning fork at her patellar region in this assessment. The chemotherapy treatment scheduled was reviewed with the patient by Dr. Rogue Bussing with specific attention given to the drugs, doses and scheduling for the future. The patient denies the use of any treatments for CIPN. The physician signed the attribution and toxicity burden assessments at the end of his visit with the patient.  Jeral Fruit, RN, BSN, OCN Date: 12/23/2019 Time:  1055 am  Meylin Stenzel [id number] 12/23/2019  Adverse Event Log  Study/Protocol: SWOG K0881 Cycle: 8 Weeks  Event Grade Onset Date Resolved Date Drug Name Attribution Treatment Comments  Dysesthesia 1  12/02/2019  Taxol 5    Neuralgia 0    0    Parasthesia 1 12/02/2019  Taxol 5    Peripheral motor neuropathy 0    0    Peripheral sensory neuropathy 1 12/02/2019  Taxol 5              Jeral Fruit, RN, BSN, OCN  12/02/2019 1051 am

## 2019-12-23 NOTE — Progress Notes (Signed)
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 No pathogenic variants identified on the [facility] Common Hereditary Cancers Panel + Skin Cancers Panel. The report date is 12/04/2019.  The Common Hereditary Cancers Panel + Skin Cancers Panel offered by [facility] includes sequencing and/or deletion duplication testing of the following 54 genes: APC*, ATM*, AXIN2, BAP1, BARD1, BMPR1A, BRCA1, BRCA2, BRIP1, CDH1, CDK4, CDKN2A (p14ARF), CDKN2A (p16INK4a), CHEK2, CTNNA1, DICER1*, EPCAM*, GREM1*, HOXB13, KIT, MEN1*, MITF*, MLH1*, MSH2*, MSH3*, MSH6*, MUTYH, NBN, NF1*, NTHL1, PALB2, PDGFRA, PMS2*, POLD1*, POLE, POT1, PTCH1, PTEN*, RAD50, RAD51C, RAD51D, RB1*, RNF43, SDHA*, SDHB, SDHC*, SDHD, SMAD4, SMARCA4, STK11, SUFU, TP53, TSC1*, TSC2, VHL.       HISTORY OF PRESENTING ILLNESS:  Ashlee Cox 69 y.o.  female patient with triple negative breast cancer stage IIb currently on carbotaxol chemotherapy is here for follow-up/review results of the interim mammogram/ultrasound.  Patient is currently due for cycle #4 of carboplatin and Taxol today.  Patient states her appetite is improving.  Her energy levels are improving.  No diarrhea.  No nausea no  vomiting.  Complains of mild tingling and numbness in the fingertips.  No significant pain.  No stumbling or falling.  Review of Systems  Constitutional: Positive for malaise/fatigue. Negative for chills, diaphoresis, fever and weight loss.  HENT: Negative for nosebleeds and sore throat.   Eyes: Negative for double vision.  Respiratory: Negative for cough, hemoptysis, sputum production and wheezing.   Cardiovascular: Negative for chest pain, palpitations, orthopnea and leg swelling.  Gastrointestinal: Positive for diarrhea. Negative for abdominal pain, blood in stool, constipation and melena.  Genitourinary: Negative for dysuria, frequency and urgency.  Musculoskeletal: Negative for back pain and joint pain.  Skin: Negative.  Negative for itching and rash.  Neurological: Positive for tingling. Negative for dizziness, focal weakness, weakness and headaches.  Endo/Heme/Allergies: Does not bruise/bleed easily.  Psychiatric/Behavioral: Negative for depression. The patient is not nervous/anxious and does not have insomnia.      MEDICAL HISTORY:  Past Medical History:  Diagnosis Date  . Cyst, breast    benign  . Family history of melanoma   . Family history of ovarian cancer   . GERD (gastroesophageal reflux disease)   . H/O: rheumatic fever   . 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 Hyperlipidemia   . Hypertension   . Menopause    at age 41  . Personal history of chemotherapy 09/2019   RIGHT  INVASIVE MAMMARY CARCINOMA    SURGICAL HISTORY: Past Surgical History:  Procedure Laterality Date  . APPENDECTOMY  2006  . BREAST BIOPSY Right 09/22/2019   INVASIVE MAMMARY CARCINOMA  . BREAST CYST ASPIRATION Left 1999  . CHOLECYSTECTOMY  2006  .  PORTACATH PLACEMENT Left 10/06/2019   Procedure: INSERTION PORT-A-CATH;  Surgeon: Robert Bellow, MD;  Location: [facility] ORS;  Service: General;  Laterality: Left;  . SUBMUCOSAL TATTOO  INJECTION Right 10/06/2019   Procedure: Right axillary TATTOO INJECTION;  Surgeon: Robert Bellow, MD;  Location: [facility] ORS;  Service: General;  Laterality: Right;  Marland Kitchen VAGINAL DELIVERY     x3    SOCIAL HISTORY: Social History   Socioeconomic History  . Marital status: Widowed    Spouse name: Not on file  . Number of children: Not on file  . Years of education: Not on file  . Highest education level: Not on file  Occupational History  . Occupation: Glass blower/designer: [employer]  Tobacco Use  . Smoking status: Former Smoker    Years: 1.00    Types: Cigarettes    Quit date: 12/26/2005    Years since quitting: 14.0  . Smokeless tobacco: Never Used  . Tobacco comment: smoked for less than 1 years,  1 cig/day  Substance and Sexual Activity  . Alcohol use: No  . Drug use: No  . Sexual activity: Never    Partners: Female  Other Topics Concern  . Not on file  Social History Narrative   Widowed, March 2014; lives with fiance.      [employer] ret[employer]; quit smoking 1ppw; no alcohol.       Social Determinants of Health   Financial Resource Strain:   . Difficulty of Paying Living Expenses: Not on file  Food Insecurity:   . Worried About Charity fundraiser in the Last Year: Not on file  . Ran Out of Food in the Last Year: Not on file  Transportation Needs:   . Lack of Transportation (Medical): Not on file  . Lack of Transportation (Non-Medical): Not on file  Physical Activity:   . Days of Exercise per Week: Not on file  . Minutes of Exercise per Session: Not on file  Stress:   . Feeling of Stress : Not on file  Social Connections:   . Frequency of Communication with Friends and Family: Not on file  . Frequency of Social Gatherings with Friends and Family: Not on file  . Attends Religious Services: Not on file  . Active Member of Clubs or Organizations: Not on file  . Attends Archivist Meetings: Not on file  . Marital Status: Not on file  Intimate Partner Violence:    . Fear of Current or Ex-Partner: Not on file  . Emotionally Abused: Not on file  . Physically Abused: Not on file  . Sexually Abused: Not on file    FAMILY HISTORY: Family History  Problem Relation Age of Onset  . Cancer Mother 70       ovarian- died 4-5 years.   . Heart disease Mother 6  . Diabetes Mother   . Stroke Father 71  . Diabetes Father   . Diabetes Sister   . Melanoma Sister        survived  . Breast cancer Neg Hx     ALLERGIES:  has No Known Allergies.  MEDICATIONS:  Current Outpatient Medications  Medication Sig Dispense Refill  . losartan-hydrochlorothiazide (HYZAAR) 50-12.5 MG tablet Take 1 tablet by mouth daily. 90 tablet 3  . lovastatin (MEVACOR) 40 MG tablet TAKE 1 TABLET BY MOUTH AT BEDTIME (Patient taking differently: Take 40 mg by mouth in the morning. ) 90 tablet 3  . metFORMIN (GLUCOPHAGE) 500 MG tablet Take 2  tablets (1,000 mg total) by mouth 2 (two) times daily with a meal. 120 tablet 3  . Multiple Vitamin (MULTIVITAMIN) tablet Take 1 tablet by mouth daily.      Marland Kitchen omeprazole (PRILOSEC) 20 MG capsule Take 1 capsule by mouth once daily 90 capsule 0  . docusate sodium (COLACE) 100 MG capsule Take 100 mg by mouth daily. (Patient not taking: Reported on 10/27/2019)    . ondansetron (ZOFRAN) 8 MG tablet One pill every 8 hours as needed for nausea/vomitting. (Patient not taking: Reported on 10/27/2019) 40 tablet 1  . prochlorperazine (COMPAZINE) 10 MG tablet Take 1 tablet (10 mg total) by mouth every 6 (six) hours as needed for nausea or vomiting. (Patient not taking: Reported on 10/27/2019) 40 tablet 1   No current facility-administered medications for this visit.      Marland Kitchen  PHYSICAL EXAMINATION: ECOG PERFORMANCE STATUS: 0 - Asymptomatic  Vitals:   12/23/19 0858  BP: (!) 149/75  Pulse: (!) 102  Resp: 16  Temp: 98.8 F (37.1 C)  SpO2: 99%   Filed Weights   12/23/19 0858  Weight: 197 lb 9.6 oz (89.6 kg)    Physical Exam HENT:     Head:  Normocephalic and atraumatic.     Mouth/Throat:     Pharynx: No oropharyngeal exudate.  Eyes:     Pupils: Pupils are equal, round, and reactive to light.  Cardiovascular:     Rate and Rhythm: Normal rate and regular rhythm.  Pulmonary:     Effort: No respiratory distress.     Breath sounds: No wheezing.  Abdominal:     General: Bowel sounds are normal. There is no distension.     Palpations: Abdomen is soft. There is no mass.     Tenderness: There is no abdominal tenderness. There is no guarding or rebound.  Musculoskeletal:        General: No tenderness. Normal range of motion.     Cervical back: Normal range of motion and neck supple.  Skin:    General: Skin is warm.     Comments: Right breast-right upper quadrant vague approximately 1 to 2 cm mass felt.  No skin changes.  No nipple changes. (In presence of chaperone)   Neurological:     Mental Status: She is alert and oriented to person, place, and time.  Psychiatric:        Mood and Affect: Affect normal.      LABORATORY DATA:  I have reviewed the data as listed Lab Results  Component Value Date   WBC 5.8 12/23/2019   HGB 10.7 (L) 12/23/2019   HCT 31.7 (L) 12/23/2019   MCV 91.4 12/23/2019   PLT 323 12/23/2019   Recent Labs    11/11/19 0849 11/11/19 0849 11/18/19 0823 11/25/19 0847 12/02/19 0922 12/09/19 0844 12/23/19 0846  NA 140   < > 137   < > 144 140 140  K 3.5   < > 3.4*   < > 3.4* 3.3* 3.3*  CL 104   < > 102   < > 106 104 104  CO2 25   < > 23   < > _0 GLUCOSE 200*   < > 176*   < > 194* 204* 237*  BUN 13   < > 14   < > _1 CREATININE 0.75   < > 0.87   < > 0.68 0.79 0.77  CALCIUM 8.9   < > 8.7*   < >  9.1 8.9 9.1  GFRNONAA >60   < > >60   < > >60 >60 >60  GFRAA >60   < > >60   < > >60 >60 >60  PROT 6.6  --  6.8  --   --   --  6.7  ALBUMIN 3.8  --  4.1  --   --   --  3.8  AST 39  --  45*  --   --   --  56*  ALT 45*  --  47*  --   --   --  43  ALKPHOS 64  --  65  --   --   --  66   BILITOT 0.5  --  0.6  --   --   --  0.6   < > = values in this interval not displayed.    RADIOGRAPHIC STUDIES: I have personally reviewed the radiological images as listed and agreed with the findings in the report. DG Fluoro Guide CV Line Left  Result Date: 12/04/2019 INDICATION: No blood return from Port-A-Cath. EXAM: PORT-A-CATH CHECK MEDICATIONS: None. ANESTHESIA/SEDATION: None. FLUOROSCOPY TIME:  Fluoroscopy Time: 0 minutes 42 seconds (49.4 mGy). COMPLICATIONS: None immediate. PROCEDURE: Standard port check was performed under fluoroscopic guidance using nonionic contrast. There were no complications. Prominent fibrin sheath is noted. Flow is noted into the venous system. IMPRESSION: Prominent fibrin sheath. Electronically Signed   By: Marcello Moores  Register   On: 12/04/2019 10:53   US Breast Limited Uni Right Inc Axilla  Result Date: 12/19/2019 CLINICAL DATA:  69 year old female with history of biopsy proven cancer of the right breast. This was biopsied in the surgeon's office 09/22/2019 with pathology results indicating grade 3 invasive mammary carcinoma, no special type. Biopsy of a right axillary lymph node demonstrated metastatic carcinoma. The patient is undergoing neoadjuvant chemotherapy, and ultrasound is requested to evaluate response. EXAM: DIGITAL DIAGNOSTIC UNILATERAL RIGHT MAMMOGRAM WITH TOMO AND CAD; ULTRASOUND RIGHT BREAST LIMITED COMPARISON:  Previous exam(s). ACR Breast Density Category c: The breast tissue is heterogeneously dense, which may obscure small masses. FINDINGS: A ribbon shaped biopsy marking clip is seen in the upper-outer quadrant of the right breast at the site of the known cancer. There is a persistent low-density asymmetry at the site of the biopsy marking clip, however the discrete mass previously seen at this site has resolved. A coil shaped biopsy marking clip is visualized in the right axilla at the site of the biopsied metastatic lymph node, and the enlarged dense  lymph node seen on the prior mammogram appears normal in size. Mammographic images were processed with CAD. Ultrasound targeted to the right breast at 10:30, 8 cm from the nipple demonstrates an irregular hypoechoic mass with associated biopsy marking clip measuring approximately 1.6 x 0.4 x 0.8 cm, previously measuring approximately 3 cm per the diagnostic report, but is measured as 2.5 x 2.3 x 2.4 cm in the ultrasound images. Precise measurement is difficult due to the ill-defined appearance of this mass. Ultrasound of the right axilla demonstrates significant decrease in size of the known metastatic lymph node, which now has normal cortical thickness. IMPRESSION: 1. Though precise measurement of the known cancer in the right breast at 10:30 is difficult due to its ill-defined appearance, there is clearly significant decrease in size in density on the mammogram and ultrasound. The biopsy proven metastatic right axillary lymph node has return to normal size. RECOMMENDATION: Follow-up imaging as clinically dictated, however the patient is due for her bilateral mammogram  no later than the end of June 2022. I have discussed the findings and recommendations with the patient. If applicable, a reminder letter will be sent to the patient regarding the next appointment. BI-RADS CATEGORY  6: Known biopsy-proven malignancy. Electronically Signed   By: Ammie Ferrier M.D.   On: 12/19/2019 11:50   MM DIAG BREAST TOMO UNI RIGHT  Result Date: 12/19/2019 CLINICAL DATA:  69 year old female with history of biopsy proven cancer of the right breast. This was biopsied in the surgeon's office 09/22/2019 with pathology results indicating grade 3 invasive mammary carcinoma, no special type. Biopsy of a right axillary lymph node demonstrated metastatic carcinoma. The patient is undergoing neoadjuvant chemotherapy, and ultrasound is requested to evaluate response. EXAM: DIGITAL DIAGNOSTIC UNILATERAL RIGHT MAMMOGRAM WITH TOMO AND  CAD; ULTRASOUND RIGHT BREAST LIMITED COMPARISON:  Previous exam(s). ACR Breast Density Category c: The breast tissue is heterogeneously dense, which may obscure small masses. FINDINGS: A ribbon shaped biopsy marking clip is seen in the upper-outer quadrant of the right breast at the site of the known cancer. There is a persistent low-density asymmetry at the site of the biopsy marking clip, however the discrete mass previously seen at this site has resolved. A coil shaped biopsy marking clip is visualized in the right axilla at the site of the biopsied metastatic lymph node, and the enlarged dense lymph node seen on the prior mammogram appears normal in size. Mammographic images were processed with CAD. Ultrasound targeted to the right breast at 10:30, 8 cm from the nipple demonstrates an irregular hypoechoic mass with associated biopsy marking clip measuring approximately 1.6 x 0.4 x 0.8 cm, previously measuring approximately 3 cm per the diagnostic report, but is measured as 2.5 x 2.3 x 2.4 cm in the ultrasound images. Precise measurement is difficult due to the ill-defined appearance of this mass. Ultrasound of the right axilla demonstrates significant decrease in size of the known metastatic lymph node, which now has normal cortical thickness. IMPRESSION: 1. Though precise measurement of the known cancer in the right breast at 10:30 is difficult due to its ill-defined appearance, there is clearly significant decrease in size in density on the mammogram and ultrasound. The biopsy proven metastatic right axillary lymph node has return to normal size. RECOMMENDATION: Follow-up imaging as clinically dictated, however the patient is due for her bilateral mammogram no later than the end of June 2022. I have discussed the findings and recommendations with the patient. If applicable, a reminder letter will be sent to the patient regarding the next appointment. BI-RADS CATEGORY  6: Known biopsy-proven malignancy.  Electronically Signed   By: Ammie Ferrier M.D.   On: 12/19/2019 11:50    ASSESSMENT & PLAN:   Carcinoma of upper-inner quadrant of right breast in female, estrogen receptor negative ([place]) # Right breast cancer-Stage IIB- T2N1-TRIPLE NEGATIVE; PET July 11th-right breast mass right underarm lymphadenopathy.  Partial response noted on physical exam.  US/mammo- Sep 24th- smaller mass/ response noted.  # proceed with CarboTaxol chemo today; Labs today reviewed;  acceptable for treatment today.   # DM-post breakfast blood sugars-237; STABLE; continue metformin 1000 mg BID.   #Genetic predisposition to malignancy: s/p genetic counseling;[mother with ovarian cancer Sister with melanoma]; reviewed the genetic testing negative.   # PN-1-2 sec to taxol/ Clinical trial-STABLE.  Discussed with clinical trials nursing.  # DISPOSITION: Daughters wedding next 9th weekend.  # chemo today-  # 1 week- MD; labs- cbc/bmp;Taxol-  # 2 weeks- MD; labs-cbc/cmp/Taxol-Dr.B  All questions were answered. The patient/family knows to call the clinic with any problems, questions or concerns.   Cammie Sickle, MD 12/23/2019 2:32 PM

## 2019-12-23 NOTE — Assessment & Plan Note (Addendum)
#   Right breast cancer-Stage IIB- T2N1-TRIPLE NEGATIVE; PET July 11th-right breast mass right underarm lymphadenopathy.  Partial response noted on physical exam.  US/mammo- Sep 24th- smaller mass/ response noted.  # proceed with CarboTaxol chemo today; Labs today reviewed;  acceptable for treatment today.   # DM-post breakfast blood sugars-237; STABLE; continue metformin 1000 mg BID.   #Genetic predisposition to malignancy: s/p genetic counseling;[mother with ovarian cancer Sister with melanoma]; reviewed the genetic testing negative.   # PN-1-2 sec to taxol/ Clinical trial-STABLE.  Discussed with clinical trials nursing.  # DISPOSITION: Daughters wedding next 9th weekend.  # chemo today-  # 1 week- MD; labs- cbc/bmp;Taxol-  # 2 weeks- MD; labs-cbc/cmp/Taxol-Dr.B

## 2019-12-30 ENCOUNTER — Other Ambulatory Visit: Payer: Self-pay

## 2019-12-30 ENCOUNTER — Inpatient Hospital Stay: Payer: PPO

## 2019-12-30 ENCOUNTER — Inpatient Hospital Stay: Payer: PPO | Attending: Internal Medicine

## 2019-12-30 VITALS — BP 149/90 | HR 92 | Temp 98.1°F | Resp 18 | Wt 195.4 lb

## 2019-12-30 DIAGNOSIS — E876 Hypokalemia: Secondary | ICD-10-CM | POA: Diagnosis not present

## 2019-12-30 DIAGNOSIS — G620 Drug-induced polyneuropathy: Secondary | ICD-10-CM | POA: Diagnosis not present

## 2019-12-30 DIAGNOSIS — G4700 Insomnia, unspecified: Secondary | ICD-10-CM | POA: Diagnosis not present

## 2019-12-30 DIAGNOSIS — Z5111 Encounter for antineoplastic chemotherapy: Secondary | ICD-10-CM | POA: Diagnosis not present

## 2019-12-30 DIAGNOSIS — Z5189 Encounter for other specified aftercare: Secondary | ICD-10-CM | POA: Diagnosis not present

## 2019-12-30 DIAGNOSIS — C50211 Malignant neoplasm of upper-inner quadrant of right female breast: Secondary | ICD-10-CM | POA: Insufficient documentation

## 2019-12-30 DIAGNOSIS — E119 Type 2 diabetes mellitus without complications: Secondary | ICD-10-CM | POA: Diagnosis not present

## 2019-12-30 DIAGNOSIS — D4861 Neoplasm of uncertain behavior of right breast: Secondary | ICD-10-CM

## 2019-12-30 DIAGNOSIS — Z171 Estrogen receptor negative status [ER-]: Secondary | ICD-10-CM | POA: Diagnosis not present

## 2019-12-30 LAB — CBC WITH DIFFERENTIAL/PLATELET
Abs Immature Granulocytes: 0.08 10*3/uL — ABNORMAL HIGH (ref 0.00–0.07)
Basophils Absolute: 0 10*3/uL (ref 0.0–0.1)
Basophils Relative: 1 %
Eosinophils Absolute: 0.1 10*3/uL (ref 0.0–0.5)
Eosinophils Relative: 2 %
HCT: 29 % — ABNORMAL LOW (ref 36.0–46.0)
Hemoglobin: 10.1 g/dL — ABNORMAL LOW (ref 12.0–15.0)
Immature Granulocytes: 2 %
Lymphocytes Relative: 31 %
Lymphs Abs: 1.7 10*3/uL (ref 0.7–4.0)
MCH: 31.8 pg (ref 26.0–34.0)
MCHC: 34.8 g/dL (ref 30.0–36.0)
MCV: 91.2 fL (ref 80.0–100.0)
Monocytes Absolute: 0.3 10*3/uL (ref 0.1–1.0)
Monocytes Relative: 5 %
Neutro Abs: 3.2 10*3/uL (ref 1.7–7.7)
Neutrophils Relative %: 59 %
Platelets: 335 10*3/uL (ref 150–400)
RBC: 3.18 MIL/uL — ABNORMAL LOW (ref 3.87–5.11)
RDW: 17 % — ABNORMAL HIGH (ref 11.5–15.5)
WBC: 5.3 10*3/uL (ref 4.0–10.5)
nRBC: 0.4 % — ABNORMAL HIGH (ref 0.0–0.2)

## 2019-12-30 LAB — BASIC METABOLIC PANEL
Anion gap: 11 (ref 5–15)
BUN: 19 mg/dL (ref 8–23)
CO2: 25 mmol/L (ref 22–32)
Calcium: 8.9 mg/dL (ref 8.9–10.3)
Chloride: 102 mmol/L (ref 98–111)
Creatinine, Ser: 0.75 mg/dL (ref 0.44–1.00)
GFR calc non Af Amer: >60 mL/min (ref >60)
Glucose, Bld: 211 mg/dL — ABNORMAL HIGH (ref 70–99)
Potassium: 3.5 mmol/L (ref 3.5–5.1)
Sodium: 138 mmol/L (ref 135–145)

## 2019-12-30 MED ORDER — SODIUM CHLORIDE 0.9 % IV SOLN
80.0000 mg/m2 | Freq: Once | INTRAVENOUS | Status: AC
  Administered 2019-12-30: 162 mg via INTRAVENOUS
  Filled 2019-12-30: qty 27

## 2019-12-30 MED ORDER — SODIUM CHLORIDE 0.9% FLUSH
10.0000 mL | Freq: Once | INTRAVENOUS | Status: AC
  Administered 2019-12-30: 10 mL via INTRAVENOUS
  Filled 2019-12-30: qty 10

## 2019-12-30 MED ORDER — DIPHENHYDRAMINE HCL 50 MG/ML IJ SOLN
50.0000 mg | Freq: Once | INTRAMUSCULAR | Status: AC
  Administered 2019-12-30: 50 mg via INTRAVENOUS
  Filled 2019-12-30: qty 1

## 2019-12-30 MED ORDER — SODIUM CHLORIDE 0.9 % IV SOLN
20.0000 mg | Freq: Once | INTRAVENOUS | Status: AC
  Administered 2019-12-30: 20 mg via INTRAVENOUS
  Filled 2019-12-30: qty 20

## 2019-12-30 MED ORDER — FAMOTIDINE IN NACL 20-0.9 MG/50ML-% IV SOLN
20.0000 mg | Freq: Once | INTRAVENOUS | Status: AC
  Administered 2019-12-30: 20 mg via INTRAVENOUS
  Filled 2019-12-30: qty 50

## 2019-12-30 MED ORDER — HEPARIN SOD (PORK) LOCK FLUSH 100 UNIT/ML IV SOLN
500.0000 [IU] | Freq: Once | INTRAVENOUS | Status: AC
  Administered 2019-12-30: 500 [IU] via INTRAVENOUS
  Filled 2019-12-30: qty 5

## 2019-12-30 MED ORDER — HEPARIN SOD (PORK) LOCK FLUSH 100 UNIT/ML IV SOLN
INTRAVENOUS | Status: AC
  Filled 2019-12-30: qty 5

## 2019-12-30 MED ORDER — SODIUM CHLORIDE 0.9 % IV SOLN
Freq: Once | INTRAVENOUS | Status: AC
  Filled 2019-12-30: qty 250

## 2020-01-07 ENCOUNTER — Inpatient Hospital Stay: Payer: PPO

## 2020-01-07 ENCOUNTER — Other Ambulatory Visit: Payer: Self-pay

## 2020-01-07 ENCOUNTER — Inpatient Hospital Stay: Payer: PPO | Admitting: Internal Medicine

## 2020-01-07 ENCOUNTER — Encounter: Payer: Self-pay | Admitting: Internal Medicine

## 2020-01-07 DIAGNOSIS — Z171 Estrogen receptor negative status [ER-]: Secondary | ICD-10-CM

## 2020-01-07 DIAGNOSIS — C50211 Malignant neoplasm of upper-inner quadrant of right female breast: Secondary | ICD-10-CM

## 2020-01-07 DIAGNOSIS — D4861 Neoplasm of uncertain behavior of right breast: Secondary | ICD-10-CM

## 2020-01-07 DIAGNOSIS — Z5111 Encounter for antineoplastic chemotherapy: Secondary | ICD-10-CM | POA: Diagnosis not present

## 2020-01-07 LAB — COMPREHENSIVE METABOLIC PANEL
ALT: 49 U/L — ABNORMAL HIGH (ref 0–44)
AST: 54 U/L — ABNORMAL HIGH (ref 15–41)
Albumin: 3.8 g/dL (ref 3.5–5.0)
Alkaline Phosphatase: 66 U/L (ref 38–126)
Anion gap: 11 (ref 5–15)
BUN: 15 mg/dL (ref 8–23)
CO2: 23 mmol/L (ref 22–32)
Calcium: 8.2 mg/dL — ABNORMAL LOW (ref 8.9–10.3)
Chloride: 104 mmol/L (ref 98–111)
Creatinine, Ser: 0.86 mg/dL (ref 0.44–1.00)
GFR, Estimated: >60 mL/min (ref >60)
Glucose, Bld: 196 mg/dL — ABNORMAL HIGH (ref 70–99)
Potassium: 3 mmol/L — ABNORMAL LOW (ref 3.5–5.1)
Sodium: 138 mmol/L (ref 135–145)
Total Bilirubin: 0.7 mg/dL (ref 0.3–1.2)
Total Protein: 6.5 g/dL (ref 6.5–8.1)

## 2020-01-07 LAB — CBC WITH DIFFERENTIAL/PLATELET
Abs Immature Granulocytes: 0.1 10*3/uL — ABNORMAL HIGH (ref 0.00–0.07)
Band Neutrophils: 1 %
Basophils Absolute: 0.1 10*3/uL (ref 0.0–0.1)
Basophils Relative: 2 %
Eosinophils Absolute: 0.1 10*3/uL (ref 0.0–0.5)
Eosinophils Relative: 3 %
HCT: 28 % — ABNORMAL LOW (ref 36.0–46.0)
Hemoglobin: 9.7 g/dL — ABNORMAL LOW (ref 12.0–15.0)
Lymphocytes Relative: 48 %
Lymphs Abs: 1.6 10*3/uL (ref 0.7–4.0)
MCH: 31.8 pg (ref 26.0–34.0)
MCHC: 34.6 g/dL (ref 30.0–36.0)
MCV: 91.8 fL (ref 80.0–100.0)
Monocytes Absolute: 0.2 10*3/uL (ref 0.1–1.0)
Monocytes Relative: 5 %
Myelocytes: 2 %
Neutro Abs: 1.4 10*3/uL — ABNORMAL LOW (ref 1.7–7.7)
Neutrophils Relative %: 39 %
Platelets: 217 10*3/uL (ref 150–400)
RBC: 3.05 MIL/uL — ABNORMAL LOW (ref 3.87–5.11)
RDW: 17.4 % — ABNORMAL HIGH (ref 11.5–15.5)
Smear Review: NORMAL
WBC: 3.4 10*3/uL — ABNORMAL LOW (ref 4.0–10.5)
nRBC: 0 % (ref 0.0–0.2)

## 2020-01-07 MED ORDER — GABAPENTIN 100 MG PO CAPS: 100.0000 mg | ORAL_CAPSULE | Freq: Every day | ORAL | 1 refills | Status: DC

## 2020-01-07 MED ORDER — FAMOTIDINE IN NACL 20-0.9 MG/50ML-% IV SOLN
20.0000 mg | Freq: Once | INTRAVENOUS | Status: AC
  Administered 2020-01-07: 20 mg via INTRAVENOUS
  Filled 2020-01-07: qty 50

## 2020-01-07 MED ORDER — SODIUM CHLORIDE 0.9 % IV SOLN
80.0000 mg/m2 | Freq: Once | INTRAVENOUS | Status: AC
  Administered 2020-01-07: 162 mg via INTRAVENOUS
  Filled 2020-01-07: qty 27

## 2020-01-07 MED ORDER — SODIUM CHLORIDE 0.9 % IV SOLN
12.0000 mg | Freq: Once | INTRAVENOUS | Status: AC
  Administered 2020-01-07: 12 mg via INTRAVENOUS
  Filled 2020-01-07: qty 1.2

## 2020-01-07 MED ORDER — HEPARIN SOD (PORK) LOCK FLUSH 100 UNIT/ML IV SOLN
500.0000 [IU] | Freq: Once | INTRAVENOUS | Status: AC | PRN
  Administered 2020-01-07: 500 [IU]
  Filled 2020-01-07: qty 5

## 2020-01-07 MED ORDER — SODIUM CHLORIDE 0.9% FLUSH
10.0000 mL | INTRAVENOUS | Status: DC | PRN
  Administered 2020-01-07 (×2): 10 mL
  Filled 2020-01-07: qty 10

## 2020-01-07 MED ORDER — HEPARIN SOD (PORK) LOCK FLUSH 100 UNIT/ML IV SOLN
INTRAVENOUS | Status: AC
  Filled 2020-01-07: qty 5

## 2020-01-07 MED ORDER — DIPHENHYDRAMINE HCL 50 MG/ML IJ SOLN
50.0000 mg | Freq: Once | INTRAMUSCULAR | Status: AC
  Administered 2020-01-07: 50 mg via INTRAVENOUS
  Filled 2020-01-07: qty 1

## 2020-01-07 MED ORDER — SODIUM CHLORIDE 0.9 % IV SOLN
Freq: Once | INTRAVENOUS | Status: AC
  Filled 2020-01-07: qty 250

## 2020-01-07 NOTE — Progress Notes (Signed)
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 Byrnett.]   # [id number] Carbo-taxol-AC #1  # SURVIVORSHIP: Pending  # GENETICS: Pending  DIAGNOSIS: Right breast cancer triple negative  STAGE:   IIB      ;  GOALS: cure  CURRENT/MOST RECENT THERAPY : carbo-taxol-AC    Carcinoma of upper-inner quadrant of right breast in female, estrogen receptor negative ([place])  10/07/2019 Initial Diagnosis   Carcinoma of upper-inner quadrant of right breast in female, estrogen receptor negative ([place])   12/04/2019 Genetic Testing   Negative genetic testing. No pathogenic variants identified on the [facility] Common Hereditary Cancers Panel + Skin Cancers Panel. The report date is 12/04/2019.  The Common Hereditary Cancers Panel + Skin Cancers Panel offered by [facility] includes sequencing and/or deletion duplication testing of the following 54 genes: APC*, ATM*, AXIN2, BAP1, BARD1, BMPR1A, BRCA1, BRCA2, BRIP1, CDH1, CDK4, CDKN2A (p14ARF), CDKN2A (p16INK4a), CHEK2, CTNNA1, DICER1*, EPCAM*, GREM1*, HOXB13, KIT, MEN1*, MITF*, MLH1*, MSH2*, MSH3*, MSH6*, MUTYH, NBN, NF1*, NTHL1, PALB2, PDGFRA, PMS2*, POLD1*, POLE, POT1, PTCH1, PTEN*, RAD50, RAD51C, RAD51D, RB1*, RNF43, SDHA*, SDHB, SDHC*, SDHD, SMAD4, SMARCA4, STK11, SUFU, TP53, TSC1*, TSC2, VHL.       HISTORY OF PRESENTING ILLNESS:  Ashlee Cox 69 y.o.  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   Patient is currently on her for cycle #4 of carboplatin and Taxol; day-15 today.  Patient complains of worsening fatigue.  However she was able to make at her daughter's wedding over the weekend.  Also complains of  intermittent tingling and numbness in the lower extremities/cramping-causing difficulty sleeping at night.  Also complains of insomnia secondary to steroids on the day of treatment.  Review of Systems  Constitutional: Positive for malaise/fatigue. Negative for chills, diaphoresis, fever and weight loss.  HENT: Negative for nosebleeds and sore throat.   Eyes: Negative for double vision.  Respiratory: Negative for cough, hemoptysis, sputum production and wheezing.   Cardiovascular: Negative for chest pain, palpitations, orthopnea and leg swelling.  Gastrointestinal: Positive for diarrhea. Negative for abdominal pain, blood in stool, constipation and melena.  Genitourinary: Negative for dysuria, frequency and urgency.  Musculoskeletal: Negative for back pain and joint pain.  Skin: Negative.  Negative for itching and rash.  Neurological: Positive for tingling. Negative for dizziness, focal weakness, weakness and headaches.  Endo/Heme/Allergies: Does not bruise/bleed easily.  Psychiatric/Behavioral: Negative for depression. The patient is not nervous/anxious and does not have insomnia.      MEDICAL HISTORY:  Past Medical History:  Diagnosis Date  . Cyst, breast    benign  . Family history of melanoma   . Family history of ovarian cancer   . GERD (gastroesophageal reflux disease)   . H/O: rheumatic fever   . 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 Hyperlipidemia   . Hypertension   . Menopause    at age 26  . Personal history of chemotherapy 09/2019   RIGHT  INVASIVE MAMMARY CARCINOMA    SURGICAL HISTORY: Past Surgical History:  Procedure Laterality Date  . APPENDECTOMY  2006  . BREAST BIOPSY Right 09/22/2019   INVASIVE MAMMARY CARCINOMA  . BREAST CYST ASPIRATION  Left 1999  . CHOLECYSTECTOMY  2006  . PORTACATH PLACEMENT Left 10/06/2019   Procedure: INSERTION PORT-A-CATH;  Surgeon: Robert Bellow, MD;  Location: [facility] ORS;   Service: General;  Laterality: Left;  . SUBMUCOSAL TATTOO INJECTION Right 10/06/2019   Procedure: Right axillary TATTOO INJECTION;  Surgeon: Robert Bellow, MD;  Location: [facility] ORS;  Service: General;  Laterality: Right;  Marland Kitchen VAGINAL DELIVERY     x3    SOCIAL HISTORY: Social History   Socioeconomic History  . Marital status: Widowed    Spouse name: Not on file  . Number of children: Not on file  . Years of education: Not on file  . Highest education level: Not on file  Occupational History  . Occupation: Glass blower/designer: [employer]  Tobacco Use  . Smoking status: Former Smoker    Years: 1.00    Types: Cigarettes    Quit date: 12/26/2005    Years since quitting: 14.0  . Smokeless tobacco: Never Used  . Tobacco comment: smoked for less than 1 years,  1 cig/day  Substance and Sexual Activity  . Alcohol use: No  . Drug use: No  . Sexual activity: Never    Partners: Female  Other Topics Concern  . Not on file  Social History Narrative   Widowed, March 2014; lives with fiance.      [employer] ret[employer]; quit smoking 1ppw; no alcohol.       Social Determinants of Health   Financial Resource Strain:   . Difficulty of Paying Living Expenses: Not on file  Food Insecurity:   . Worried About Charity fundraiser in the Last Year: Not on file  . Ran Out of Food in the Last Year: Not on file  Transportation Needs:   . Lack of Transportation (Medical): Not on file  . Lack of Transportation (Non-Medical): Not on file  Physical Activity:   . Days of Exercise per Week: Not on file  . Minutes of Exercise per Session: Not on file  Stress:   . Feeling of Stress : Not on file  Social Connections:   . Frequency of Communication with Friends and Family: Not on file  . Frequency of Social Gatherings with Friends and Family: Not on file  . Attends Religious Services: Not on file  . Active Member of Clubs or Organizations: Not on file  . Attends Archivist Meetings: Not on file  .  Marital Status: Not on file  Intimate Partner Violence:   . Fear of Current or Ex-Partner: Not on file  . Emotionally Abused: Not on file  . Physically Abused: Not on file  . Sexually Abused: Not on file    FAMILY HISTORY: Family History  Problem Relation Age of Onset  . Cancer Mother 6       ovarian- died 4-5 years.   . Heart disease Mother 5  . Diabetes Mother   . Stroke Father 56  . Diabetes Father   . Diabetes Sister   . Melanoma Sister        survived  . Breast cancer Neg Hx     ALLERGIES:  has No Known Allergies.  MEDICATIONS:  Current Outpatient Medications  Medication Sig Dispense Refill  . losartan-hydrochlorothiazide (HYZAAR) 50-12.5 MG tablet Take 1 tablet by mouth daily. 90 tablet 3  . lovastatin (MEVACOR) 40 MG tablet TAKE 1 TABLET BY MOUTH AT BEDTIME (Patient taking differently: Take 40 mg by mouth in the morning. ) 90 tablet 3  .  metFORMIN (GLUCOPHAGE) 500 MG tablet Take 2 tablets (1,000 mg total) by mouth 2 (two) times daily with a meal. 120 tablet 3  . Multiple Vitamin (MULTIVITAMIN) tablet Take 1 tablet by mouth daily.      Marland Kitchen omeprazole (PRILOSEC) 20 MG capsule Take 1 capsule by mouth once daily 90 capsule 0  . docusate sodium (COLACE) 100 MG capsule Take 100 mg by mouth daily. (Patient not taking: Reported on 10/27/2019)    . gabapentin (NEURONTIN) 100 MG capsule Take 1 capsule (100 mg total) by mouth at bedtime. 30 capsule 1  . ondansetron (ZOFRAN) 8 MG tablet One pill every 8 hours as needed for nausea/vomitting. (Patient not taking: Reported on 10/27/2019) 40 tablet 1  . prochlorperazine (COMPAZINE) 10 MG tablet Take 1 tablet (10 mg total) by mouth every 6 (six) hours as needed for nausea or vomiting. (Patient not taking: Reported on 10/27/2019) 40 tablet 1   No current facility-administered medications for this visit.      Marland Kitchen  PHYSICAL EXAMINATION: ECOG PERFORMANCE STATUS: 0 - Asymptomatic  Vitals:   01/07/20 0901  BP: 129/66  Pulse: 87  Resp: 16   Temp: 99 F (37.2 C)  SpO2: 96%   Filed Weights   01/07/20 0901  Weight: 195 lb 9.6 oz (88.7 kg)    Physical Exam HENT:     Head: Normocephalic and atraumatic.     Mouth/Throat:     Pharynx: No oropharyngeal exudate.  Eyes:     Pupils: Pupils are equal, round, and reactive to light.  Cardiovascular:     Rate and Rhythm: Normal rate and regular rhythm.  Pulmonary:     Effort: No respiratory distress.     Breath sounds: No wheezing.  Abdominal:     General: Bowel sounds are normal. There is no distension.     Palpations: Abdomen is soft. There is no mass.     Tenderness: There is no abdominal tenderness. There is no guarding or rebound.  Musculoskeletal:        General: No tenderness. Normal range of motion.     Cervical back: Normal range of motion and neck supple.  Skin:    General: Skin is warm.     Comments: Right breast-right upper quadrant vague approximately 1 to 2 cm mass felt.  No skin changes.  No nipple changes. (In presence of chaperone)   Neurological:     Mental Status: She is alert and oriented to person, place, and time.  Psychiatric:        Mood and Affect: Affect normal.      LABORATORY DATA:  I have reviewed the data as listed Lab Results  Component Value Date   WBC 3.4 (L) 01/07/2020   HGB 9.7 (L) 01/07/2020   HCT 28.0 (L) 01/07/2020   MCV 91.8 01/07/2020   PLT 217 01/07/2020   Recent Labs    11/18/19 0823 11/25/19 0847 12/02/19 0922 12/02/19 0922 12/09/19 0844 12/09/19 0844 12/23/19 0846 12/30/19 0839 01/07/20 0840  NA 137   < > 144   < > 140   < > 140 138 138  K 3.4*   < > 3.4*   < > 3.3*   < > 3.3* 3.5 3.0*  CL 102   < > 106   < > 104   < > 104 102 104  CO2 23   < > 26   < > 23   < > 24 25 23   GLUCOSE 176*   < >  194*   < > 204*   < > 237* 211* 196*  BUN 14   < > 10   < > 13   < > 10 19 15   CREATININE 0.87   < > 0.68   < > 0.79   < > 0.77 0.75 0.86  CALCIUM 8.7*   < > 9.1   < > 8.9   < > 9.1 8.9 8.2*  GFRNONAA >60   < > >60    < > >60   < > >60 >60 >60  GFRAA >60   < > >60  --  >60  --  >60  --   --   PROT 6.8  --   --   --   --   --  6.7  --  6.5  ALBUMIN 4.1  --   --   --   --   --  3.8  --  3.8  AST 45*  --   --   --   --   --  56*  --  54*  ALT 47*  --   --   --   --   --  43  --  49*  ALKPHOS 65  --   --   --   --   --  66  --  66  BILITOT 0.6  --   --   --   --   --  0.6  --  0.7   < > = values in this interval not displayed.    RADIOGRAPHIC STUDIES: I have personally reviewed the radiological images as listed and agreed with the findings in the report. US Breast Limited Uni Right Inc Axilla  Result Date: 12/19/2019 CLINICAL DATA:  68 year old female with history of biopsy proven cancer of the right breast. This was biopsied in the surgeon's office 09/22/2019 with pathology results indicating grade 3 invasive mammary carcinoma, no special type. Biopsy of a right axillary lymph node demonstrated metastatic carcinoma. The patient is undergoing neoadjuvant chemotherapy, and ultrasound is requested to evaluate response. EXAM: DIGITAL DIAGNOSTIC UNILATERAL RIGHT MAMMOGRAM WITH TOMO AND CAD; ULTRASOUND RIGHT BREAST LIMITED COMPARISON:  Previous exam(s). ACR Breast Density Category c: The breast tissue is heterogeneously dense, which may obscure small masses. FINDINGS: A ribbon shaped biopsy marking clip is seen in the upper-outer quadrant of the right breast at the site of the known cancer. There is a persistent low-density asymmetry at the site of the biopsy marking clip, however the discrete mass previously seen at this site has resolved. A coil shaped biopsy marking clip is visualized in the right axilla at the site of the biopsied metastatic lymph node, and the enlarged dense lymph node seen on the prior mammogram appears normal in size. Mammographic images were processed with CAD. Ultrasound targeted to the right breast at 10:30, 8 cm from the nipple demonstrates an irregular hypoechoic mass with associated biopsy  marking clip measuring approximately 1.6 x 0.4 x 0.8 cm, previously measuring approximately 3 cm per the diagnostic report, but is measured as 2.5 x 2.3 x 2.4 cm in the ultrasound images. Precise measurement is difficult due to the ill-defined appearance of this mass. Ultrasound of the right axilla demonstrates significant decrease in size of the known metastatic lymph node, which now has normal cortical thickness. IMPRESSION: 1. Though precise measurement of the known cancer in the right breast at 10:30 is difficult due to its ill-defined appearance, there is clearly significant decrease in size in  density on the mammogram and ultrasound. The biopsy proven metastatic right axillary lymph node has return to normal size. RECOMMENDATION: Follow-up imaging as clinically dictated, however the patient is due for her bilateral mammogram no later than the end of June 2022. I have discussed the findings and recommendations with the patient. If applicable, a reminder letter will be sent to the patient regarding the next appointment. BI-RADS CATEGORY  6: Known biopsy-proven malignancy. Electronically Signed   By: Ammie Ferrier M.D.   On: 12/19/2019 11:50   MM DIAG BREAST TOMO UNI RIGHT  Result Date: 12/19/2019 CLINICAL DATA:  69 year old female with history of biopsy proven cancer of the right breast. This was biopsied in the surgeon's office 09/22/2019 with pathology results indicating grade 3 invasive mammary carcinoma, no special type. Biopsy of a right axillary lymph node demonstrated metastatic carcinoma. The patient is undergoing neoadjuvant chemotherapy, and ultrasound is requested to evaluate response. EXAM: DIGITAL DIAGNOSTIC UNILATERAL RIGHT MAMMOGRAM WITH TOMO AND CAD; ULTRASOUND RIGHT BREAST LIMITED COMPARISON:  Previous exam(s). ACR Breast Density Category c: The breast tissue is heterogeneously dense, which may obscure small masses. FINDINGS: A ribbon shaped biopsy marking clip is seen in the upper-outer  quadrant of the right breast at the site of the known cancer. There is a persistent low-density asymmetry at the site of the biopsy marking clip, however the discrete mass previously seen at this site has resolved. A coil shaped biopsy marking clip is visualized in the right axilla at the site of the biopsied metastatic lymph node, and the enlarged dense lymph node seen on the prior mammogram appears normal in size. Mammographic images were processed with CAD. Ultrasound targeted to the right breast at 10:30, 8 cm from the nipple demonstrates an irregular hypoechoic mass with associated biopsy marking clip measuring approximately 1.6 x 0.4 x 0.8 cm, previously measuring approximately 3 cm per the diagnostic report, but is measured as 2.5 x 2.3 x 2.4 cm in the ultrasound images. Precise measurement is difficult due to the ill-defined appearance of this mass. Ultrasound of the right axilla demonstrates significant decrease in size of the known metastatic lymph node, which now has normal cortical thickness. IMPRESSION: 1. Though precise measurement of the known cancer in the right breast at 10:30 is difficult due to its ill-defined appearance, there is clearly significant decrease in size in density on the mammogram and ultrasound. The biopsy proven metastatic right axillary lymph node has return to normal size. RECOMMENDATION: Follow-up imaging as clinically dictated, however the patient is due for her bilateral mammogram no later than the end of June 2022. I have discussed the findings and recommendations with the patient. If applicable, a reminder letter will be sent to the patient regarding the next appointment. BI-RADS CATEGORY  6: Known biopsy-proven malignancy. Electronically Signed   By: Ammie Ferrier M.D.   On: 12/19/2019 11:50    ASSESSMENT & PLAN:   Carcinoma of upper-inner quadrant of right breast in female, estrogen receptor negative ([place]) # Right breast cancer-Stage IIB- T2N1-TRIPLE NEGATIVE;  PET July 11th-right breast mass right underarm lymphadenopathy.  Partial response noted on physical exam.  US/mammo- Sep 24th- smaller mass/ response noted. STABLE.   # proceed with CarboTaxol chemo today; Labs today reviewed;  acceptable for treatment today.  Discussed that patient will be due for Adriamycin Cytoxan-in 2 weeks.  #Mild hypokalemia potassium 3.0 recommend dietary intake.  # DM-post breakfast blood sugars-196; STABLE;  continue metformin 1000 mg BID.   # PN-2 sec to taxol; worse-on  Clinical trial- but with insomnia add Neurontin.   # Insomnia- sec to steroids; see above. Cut the steroids to 12 mg.    # DISPOSITION: # chemo today-  # 2 weeks- MD; labs-cbc/cmp; Adriamycin-Cytoxan; D-2-fulphila-Dr.B     All questions were answered. The patient/family knows to call the clinic with any problems, questions or concerns.   Cammie Sickle, MD 01/07/2020 10:01 AM

## 2020-01-07 NOTE — Progress Notes (Signed)
Pt would like a recommendation on what to take for sleep. Has not been sleeping well due to her neuropathy.

## 2020-01-07 NOTE — Assessment & Plan Note (Addendum)
#   Right breast cancer-Stage IIB- T2N1-TRIPLE NEGATIVE; PET July 11th-right breast mass right underarm lymphadenopathy.  Partial response noted on physical exam.  US/mammo- Sep 24th- smaller mass/ response noted. STABLE.   # proceed with CarboTaxol chemo today; Labs today reviewed;  acceptable for treatment today.  Discussed that patient will be due for Adriamycin Cytoxan-in 2 weeks.  #Mild hypokalemia potassium 3.0 recommend dietary intake.  # DM-post breakfast blood sugars-196; STABLE;  continue metformin 1000 mg BID.   # PN-2 sec to taxol; worse-on Clinical trial- but with insomnia add Neurontin.   # Insomnia- sec to steroids; see above. Cut the steroids to 12 mg.    # DISPOSITION: # chemo today-  # 2 weeks- MD; labs-cbc/cmp; Adriamycin-Cytoxan; D-2-fulphila-Dr.B

## 2020-01-16 ENCOUNTER — Other Ambulatory Visit: Payer: Self-pay | Admitting: Internal Medicine

## 2020-01-18 ENCOUNTER — Other Ambulatory Visit: Payer: Self-pay | Admitting: Internal Medicine

## 2020-01-21 ENCOUNTER — Inpatient Hospital Stay: Payer: PPO | Admitting: Internal Medicine

## 2020-01-21 ENCOUNTER — Inpatient Hospital Stay: Payer: PPO

## 2020-01-21 ENCOUNTER — Encounter: Payer: Self-pay | Admitting: Internal Medicine

## 2020-01-21 ENCOUNTER — Other Ambulatory Visit: Payer: Self-pay

## 2020-01-21 DIAGNOSIS — Z171 Estrogen receptor negative status [ER-]: Secondary | ICD-10-CM | POA: Diagnosis not present

## 2020-01-21 DIAGNOSIS — C50211 Malignant neoplasm of upper-inner quadrant of right female breast: Secondary | ICD-10-CM | POA: Diagnosis not present

## 2020-01-21 DIAGNOSIS — D4861 Neoplasm of uncertain behavior of right breast: Secondary | ICD-10-CM

## 2020-01-21 DIAGNOSIS — Z5111 Encounter for antineoplastic chemotherapy: Secondary | ICD-10-CM | POA: Diagnosis not present

## 2020-01-21 LAB — CBC WITH DIFFERENTIAL/PLATELET
Abs Immature Granulocytes: 0.03 10*3/uL (ref 0.00–0.07)
Basophils Absolute: 0 10*3/uL (ref 0.0–0.1)
Basophils Relative: 1 %
Eosinophils Absolute: 0 10*3/uL (ref 0.0–0.5)
Eosinophils Relative: 1 %
HCT: 32.1 % — ABNORMAL LOW (ref 36.0–46.0)
Hemoglobin: 10.8 g/dL — ABNORMAL LOW (ref 12.0–15.0)
Immature Granulocytes: 1 %
Lymphocytes Relative: 29 %
Lymphs Abs: 1.4 10*3/uL (ref 0.7–4.0)
MCH: 31.8 pg (ref 26.0–34.0)
MCHC: 33.6 g/dL (ref 30.0–36.0)
MCV: 94.4 fL (ref 80.0–100.0)
Monocytes Absolute: 0.5 10*3/uL (ref 0.1–1.0)
Monocytes Relative: 9 %
Neutro Abs: 3 10*3/uL (ref 1.7–7.7)
Neutrophils Relative %: 59 %
Platelets: 248 10*3/uL (ref 150–400)
RBC: 3.4 MIL/uL — ABNORMAL LOW (ref 3.87–5.11)
RDW: 16.4 % — ABNORMAL HIGH (ref 11.5–15.5)
WBC: 4.9 10*3/uL (ref 4.0–10.5)
nRBC: 0 % (ref 0.0–0.2)

## 2020-01-21 LAB — COMPREHENSIVE METABOLIC PANEL
ALT: 47 U/L — ABNORMAL HIGH (ref 0–44)
AST: 61 U/L — ABNORMAL HIGH (ref 15–41)
Albumin: 3.8 g/dL (ref 3.5–5.0)
Alkaline Phosphatase: 67 U/L (ref 38–126)
Anion gap: 12 (ref 5–15)
BUN: 16 mg/dL (ref 8–23)
CO2: 21 mmol/L — ABNORMAL LOW (ref 22–32)
Calcium: 8.8 mg/dL — ABNORMAL LOW (ref 8.9–10.3)
Chloride: 104 mmol/L (ref 98–111)
Creatinine, Ser: 0.77 mg/dL (ref 0.44–1.00)
GFR, Estimated: >60 mL/min (ref >60)
Glucose, Bld: 175 mg/dL — ABNORMAL HIGH (ref 70–99)
Potassium: 3.6 mmol/L (ref 3.5–5.1)
Sodium: 137 mmol/L (ref 135–145)
Total Bilirubin: 0.8 mg/dL (ref 0.3–1.2)
Total Protein: 6.5 g/dL (ref 6.5–8.1)

## 2020-01-21 MED ORDER — SODIUM CHLORIDE 0.9 % IV SOLN
600.0000 mg/m2 | Freq: Once | INTRAVENOUS | Status: AC
  Administered 2020-01-21: 1200 mg via INTRAVENOUS
  Filled 2020-01-21: qty 50

## 2020-01-21 MED ORDER — SODIUM CHLORIDE 0.9 % IV SOLN
Freq: Once | INTRAVENOUS | Status: AC
  Filled 2020-01-21: qty 250

## 2020-01-21 MED ORDER — SODIUM CHLORIDE 0.9 % IV SOLN
150.0000 mg | Freq: Once | INTRAVENOUS | Status: AC
  Administered 2020-01-21: 150 mg via INTRAVENOUS
  Filled 2020-01-21: qty 150

## 2020-01-21 MED ORDER — HEPARIN SOD (PORK) LOCK FLUSH 100 UNIT/ML IV SOLN
500.0000 [IU] | Freq: Once | INTRAVENOUS | Status: AC | PRN
  Administered 2020-01-21: 500 [IU]
  Filled 2020-01-21: qty 5

## 2020-01-21 MED ORDER — DOXORUBICIN HCL CHEMO IV INJECTION 2 MG/ML
60.0000 mg/m2 | Freq: Once | INTRAVENOUS | Status: AC
  Administered 2020-01-21: 120 mg via INTRAVENOUS
  Filled 2020-01-21: qty 50

## 2020-01-21 MED ORDER — ALTEPLASE 2 MG IJ SOLR
2.0000 mg | Freq: Once | INTRAMUSCULAR | Status: AC | PRN
  Administered 2020-01-21: 2 mg
  Filled 2020-01-21: qty 2

## 2020-01-21 MED ORDER — PALONOSETRON HCL INJECTION 0.25 MG/5ML
0.2500 mg | Freq: Once | INTRAVENOUS | Status: AC
  Administered 2020-01-21: 0.25 mg via INTRAVENOUS
  Filled 2020-01-21: qty 5

## 2020-01-21 MED ORDER — HEPARIN SOD (PORK) LOCK FLUSH 100 UNIT/ML IV SOLN
INTRAVENOUS | Status: AC
  Filled 2020-01-21: qty 5

## 2020-01-21 MED ORDER — SODIUM CHLORIDE 0.9 % IV SOLN
10.0000 mg | Freq: Once | INTRAVENOUS | Status: AC
  Administered 2020-01-21: 10 mg via INTRAVENOUS
  Filled 2020-01-21: qty 10

## 2020-01-21 NOTE — Progress Notes (Signed)
Patient here for oncology follow-up appointment, expresses complaints of diarrhea, SOB, numbness and nail discoloration.

## 2020-01-21 NOTE — Progress Notes (Signed)
Pt seen in clinic by MD. No blood return by port for labs. Per MD- administer Cathflo 2 mg and if with blood return- may proceed with treatment. At 1022- adminstered Cathflo 2 mg. At 1055 - no blood return . Per policy- waited 90 more minutes. At 1230- pt had positive blood return. Ok to treat per MD. Treatment given per orders . Continued to have blood return during Adriamycin IVP and also when de-accessing port. Pt discharged stable from clinic.

## 2020-01-21 NOTE — Assessment & Plan Note (Addendum)
#   Right breast cancer-Stage IIB- T2N1-TRIPLE NEGATIVE; PET July 11th-right breast mass right underarm lymphadenopathy.  Partial response noted on physical exam.  US/mammo- Sep 24th- smaller mass/ response noted.  STABLE.    # proceed with AC chemo cycle #1 chemo today; Labs today reviewed.  Again reviewed the potential side effects including but not limited to nausea vomiting worsening fatigue etc.  #Mild hypokalemia-dietary supplementation potassium 3.6.  Stable  # DM-post breakfast blood sugars-196; STABLE;  continue metformin 1000 mg BID.   # PN-2 sec to taxol-on Neurontin nightly.  Stable.  #Port malfunction-dye study September, 2021 possible fibrin sheath.  S/p TPA-resolved.  # DISPOSITION: #Chemotherapy today; Fulphila tomorrow # 3 weeks- MD; labs-cbc/cmp; Adriamycin-Cytoxan; D-2-fulphila-Dr.B

## 2020-01-21 NOTE — Progress Notes (Signed)
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 Byrnett.]   # [id number] Carbo-taxol-AC #1  # SURVIVORSHIP: Pending  # GENETICS: Pending  DIAGNOSIS: Right breast cancer triple negative  STAGE:   IIB      ;  GOALS: cure  CURRENT/MOST RECENT THERAPY : carbo-taxol-AC    Carcinoma of upper-inner quadrant of right breast in female, estrogen receptor negative ([place])  10/07/2019 Initial Diagnosis   Carcinoma of upper-inner quadrant of right breast in female, estrogen receptor negative ([place])   12/04/2019 Genetic Testing   Negative genetic testing. No pathogenic variants identified on the [facility] Common Hereditary Cancers Panel + Skin Cancers Panel. The report date is 12/04/2019.  The Common Hereditary Cancers Panel + Skin Cancers Panel offered by [facility] includes sequencing and/or deletion duplication testing of the following 54 genes: APC*, ATM*, AXIN2, BAP1, BARD1, BMPR1A, BRCA1, BRCA2, BRIP1, CDH1, CDK4, CDKN2A (p14ARF), CDKN2A (p16INK4a), CHEK2, CTNNA1, DICER1*, EPCAM*, GREM1*, HOXB13, KIT, MEN1*, MITF*, MLH1*, MSH2*, MSH3*, MSH6*, MUTYH, NBN, NF1*, NTHL1, PALB2, PDGFRA, PMS2*, POLD1*, POLE, POT1, PTCH1, PTEN*, RAD50, RAD51C, RAD51D, RB1*, RNF43, SDHA*, SDHB, SDHC*, SDHD, SMAD4, SMARCA4, STK11, SUFU, TP53, TSC1*, TSC2, VHL.       HISTORY OF PRESENTING ILLNESS:  Ashlee Cox 69 y.o.  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  Patient is currently status post 4 cycles of carboplatin Taxol chemotherapy.   Patient complains of mild to moderate fatigue.  Patient has 1-2 loose stools every 3 days.  Otherwise no ongoing diarrhea.  No nausea no vomiting.   Complains no new changes.  Otherwise mild tingling numbness extremities.  No new changes.  Review of Systems  Constitutional: Positive for malaise/fatigue. Negative for chills, diaphoresis, fever and weight loss.  HENT: Negative for nosebleeds and sore throat.   Eyes: Negative for double vision.  Respiratory: Negative for cough, hemoptysis, sputum production and wheezing.   Cardiovascular: Negative for chest pain, palpitations, orthopnea and leg swelling.  Gastrointestinal: Positive for diarrhea. Negative for abdominal pain, blood in stool, constipation and melena.  Genitourinary: Negative for dysuria, frequency and urgency.  Musculoskeletal: Negative for back pain and joint pain.  Skin: Negative.  Negative for itching and rash.  Neurological: Positive for tingling. Negative for dizziness, focal weakness, weakness and headaches.  Endo/Heme/Allergies: Does not bruise/bleed easily.  Psychiatric/Behavioral: Negative for depression. The patient is not nervous/anxious and does not have insomnia.      MEDICAL HISTORY:  Past Medical History:  Diagnosis Date  . Cyst, breast    benign  . Family history of melanoma   . Family history of ovarian cancer   . GERD (gastroesophageal reflux disease)   . H/O: rheumatic fever   . 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 Hyperlipidemia   . Hypertension   . Menopause    at age 89  . Personal history of chemotherapy 09/2019   RIGHT  INVASIVE MAMMARY CARCINOMA    SURGICAL HISTORY: Past Surgical History:  Procedure Laterality Date  . APPENDECTOMY  2006  . BREAST BIOPSY Right 09/22/2019   INVASIVE MAMMARY CARCINOMA  . BREAST CYST ASPIRATION Left 1999  . CHOLECYSTECTOMY  2006  . PORTACATH  PLACEMENT Left 10/06/2019   Procedure: INSERTION PORT-A-CATH;  Surgeon: Robert Bellow, MD;  Location: [facility] ORS;  Service: General;  Laterality: Left;  . SUBMUCOSAL TATTOO INJECTION Right 10/06/2019    Procedure: Right axillary TATTOO INJECTION;  Surgeon: Robert Bellow, MD;  Location: [facility] ORS;  Service: General;  Laterality: Right;  Marland Kitchen VAGINAL DELIVERY     x3    SOCIAL HISTORY: Social History   Socioeconomic History  . Marital status: Widowed    Spouse name: Not on file  . Number of children: Not on file  . Years of education: Not on file  . Highest education level: Not on file  Occupational History  . Occupation: Glass blower/designer: [email or web address]  Tobacco Use  . Smoking status: Former Smoker    Years: 1.00    Types: Cigarettes    Quit date: 12/26/2005    Years since quitting: 14.0  . Smokeless tobacco: Never Used  . Tobacco comment: smoked for less than 1 years,  1 cig/day  Substance and Sexual Activity  . Alcohol use: No  . Drug use: No  . Sexual activity: Never    Partners: Female  Other Topics Concern  . Not on file  Social History Narrative   Widowed, March 2014; lives with fiance.      [employer] ret[employer]; quit smoking 1ppw; no alcohol.       Social Determinants of Health   Financial Resource Strain:   . Difficulty of Paying Living Expenses: Not on file  Food Insecurity:   . Worried About Charity fundraiser in the Last Year: Not on file  . Ran Out of Food in the Last Year: Not on file  Transportation Needs:   . Lack of Transportation (Medical): Not on file  . Lack of Transportation (Non-Medical): Not on file  Physical Activity:   . Days of Exercise per Week: Not on file  . Minutes of Exercise per Session: Not on file  Stress:   . Feeling of Stress : Not on file  Social Connections:   . Frequency of Communication with Friends and Family: Not on file  . Frequency of Social Gatherings with Friends and Family: Not on file  . Attends Religious Services: Not on file  . Active Member of Clubs or Organizations: Not on file  . Attends Archivist Meetings: Not on file  . Marital Status: Not on file  Intimate Partner Violence:   . Fear of Current or  Ex-Partner: Not on file  . Emotionally Abused: Not on file  . Physically Abused: Not on file  . Sexually Abused: Not on file    FAMILY HISTORY: Family History  Problem Relation Age of Onset  . Cancer Mother 36       ovarian- died 4-5 years.   . Heart disease Mother 62  . Diabetes Mother   . Stroke Father 2  . Diabetes Father   . Diabetes Sister   . Melanoma Sister        survived  . Breast cancer Neg Hx     ALLERGIES:  has No Known Allergies.  MEDICATIONS:  Current Outpatient Medications  Medication Sig Dispense Refill  . gabapentin (NEURONTIN) 100 MG capsule Take 1 capsule (100 mg total) by mouth at bedtime. 30 capsule 1  . losartan-hydrochlorothiazide (HYZAAR) 50-12.5 MG tablet Take 1 tablet by mouth daily. 90 tablet 3  . lovastatin (MEVACOR) 40 MG tablet TAKE 1 TABLET BY MOUTH AT BEDTIME (Patient taking differently: Take 40  mg by mouth in the morning. ) 90 tablet 3  . metFORMIN (GLUCOPHAGE) 500 MG tablet Take 2 tablets (1,000 mg total) by mouth 2 (two) times daily with a meal. 120 tablet 3  . Multiple Vitamin (MULTIVITAMIN) tablet Take 1 tablet by mouth daily.      Marland Kitchen omeprazole (PRILOSEC) 20 MG capsule Take 1 capsule by mouth once daily 90 capsule 0  . docusate sodium (COLACE) 100 MG capsule Take 100 mg by mouth daily. (Patient not taking: Reported on 10/27/2019)    . ondansetron (ZOFRAN) 8 MG tablet One pill every 8 hours as needed for nausea/vomitting. (Patient not taking: Reported on 10/27/2019) 40 tablet 1  . prochlorperazine (COMPAZINE) 10 MG tablet Take 1 tablet (10 mg total) by mouth every 6 (six) hours as needed for nausea or vomiting. (Patient not taking: Reported on 10/27/2019) 40 tablet 1   No current facility-administered medications for this visit.   Facility-Administered Medications Ordered in Other Visits  Medication Dose Route Frequency Provider Last Rate Last Admin  . cyclophosphamide (CYTOXAN) 1,200 mg in sodium chloride 0.9 % 250 mL chemo infusion  600 mg/m2  (Treatment Plan Recorded) Intravenous Once Cammie Sickle, MD 620 mL/hr at 01/21/20 1416 1,200 mg at 01/21/20 1416      .  PHYSICAL EXAMINATION: ECOG PERFORMANCE STATUS: 0 - Asymptomatic  Vitals:   01/21/20 0919  BP: (!) 158/84  Pulse: 88  Resp: 17  Temp: (!) 97.3 F (36.3 C)  SpO2: 100%   Filed Weights   01/21/20 0919  Weight: 197 lb 6.4 oz (89.5 kg)    Physical Exam HENT:     Head: Normocephalic and atraumatic.     Mouth/Throat:     Pharynx: No oropharyngeal exudate.  Eyes:     Pupils: Pupils are equal, round, and reactive to light.  Cardiovascular:     Rate and Rhythm: Normal rate and regular rhythm.  Pulmonary:     Effort: No respiratory distress.     Breath sounds: No wheezing.  Abdominal:     General: Bowel sounds are normal. There is no distension.     Palpations: Abdomen is soft. There is no mass.     Tenderness: There is no abdominal tenderness. There is no guarding or rebound.  Musculoskeletal:        General: No tenderness. Normal range of motion.     Cervical back: Normal range of motion and neck supple.  Skin:    General: Skin is warm.     Comments: Right breast-right upper quadrant vague approximately 1 to 2 cm mass felt.  No skin changes.  No nipple changes. (In presence of chaperone)   Neurological:     Mental Status: She is alert and oriented to person, place, and time.  Psychiatric:        Mood and Affect: Affect normal.      LABORATORY DATA:  I have reviewed the data as listed Lab Results  Component Value Date   WBC 4.9 01/21/2020   HGB 10.8 (L) 01/21/2020   HCT 32.1 (L) 01/21/2020   MCV 94.4 01/21/2020   PLT 248 01/21/2020   Recent Labs    11/18/19 0823 12/02/19 0922 12/02/19 0922 12/09/19 0844 12/23/19 0846 12/23/19 0846 12/30/19 0839 01/07/20 0840 01/21/20 0842  NA   < > 144   < > 140 140   < > 138 138 137  K   < > 3.4*   < > 3.3* 3.3*   < > 3.5 3.0*  3.6  CL   < > 106   < > 104 104   < > 102 104 104  CO2   < >  26   < > 23 24   < > 25 23 21*  GLUCOSE   < > 194*   < > 204* 237*   < > 211* 196* 175*  BUN   < > 10   < > 13 10   < > _0 CREATININE   < > 0.68   < > 0.79 0.77   < > 0.75 0.86 0.77  CALCIUM   < > 9.1   < > 8.9 9.1   < > 8.9 8.2* 8.8*  GFRNONAA   < > >60   < > >60 >60   < > >60 >60 >60  GFRAA  --  >60  --  >60 >60  --   --   --   --   PROT   < >  --   --   --  6.7  --   --  6.5 6.5  ALBUMIN   < >  --   --   --  3.8  --   --  3.8 3.8  AST   < >  --   --   --  56*  --   --  54* 61*  ALT   < >  --   --   --  43  --   --  49* 47*  ALKPHOS   < >  --   --   --  66  --   --  66 67  BILITOT   < >  --   --   --  0.6  --   --  0.7 0.8   < > = values in this interval not displayed.    RADIOGRAPHIC STUDIES: I have personally reviewed the radiological images as listed and agreed with the findings in the report. No results found.  ASSESSMENT & PLAN:   Carcinoma of upper-inner quadrant of right breast in female, estrogen receptor negative (Ashlee Cox) # Right breast cancer-Stage IIB- T2N1-TRIPLE NEGATIVE; PET July 11th-right breast mass right underarm lymphadenopathy.  Partial response noted on physical exam.  US/mammo- Sep 24th- smaller mass/ response noted.  STABLE.    # proceed with AC chemo cycle #1 chemo today; Labs today reviewed.  Again reviewed the potential side effects including but not limited to nausea vomiting worsening fatigue etc.  #Mild hypokalemia-dietary supplementation potassium 3.6.  Stable  # DM-post breakfast blood sugars-196; STABLE;  continue metformin 1000 mg BID.   # PN-2 sec to taxol-on Neurontin nightly.  Stable.  #Port malfunction-dye study September, 2021 possible fibrin sheath.  S/p TPA-resolved.  # DISPOSITION: #Chemotherapy today; Fulphila tomorrow # 3 weeks- MD; labs-cbc/cmp; Adriamycin-Cytoxan; D-2-fulphila-Dr.B     All questions were answered. The patient/family knows to call the clinic with any problems, questions or concerns.   Cammie Sickle,  MD 01/21/2020 2:17 PM

## 2020-01-22 ENCOUNTER — Telehealth: Payer: Self-pay | Admitting: Internal Medicine

## 2020-01-22 ENCOUNTER — Inpatient Hospital Stay: Payer: PPO

## 2020-01-22 DIAGNOSIS — D4861 Neoplasm of uncertain behavior of right breast: Secondary | ICD-10-CM

## 2020-01-22 DIAGNOSIS — Z5111 Encounter for antineoplastic chemotherapy: Secondary | ICD-10-CM | POA: Diagnosis not present

## 2020-01-22 MED ORDER — PEGFILGRASTIM-JMDB 6 MG/0.6ML ~~LOC~~ SOSY
6.0000 mg | PREFILLED_SYRINGE | Freq: Once | SUBCUTANEOUS | Status: AC
  Administered 2020-01-22: 6 mg via SUBCUTANEOUS
  Filled 2020-01-22: qty 0.6

## 2020-01-22 NOTE — Telephone Encounter (Signed)
On 10/27-left voicemail for the patient regarding upcoming chemotherapy plan we will plan cycle #2 of AC in 3 weeks.  Patient will come for her growth factor support tomorrow after cycle #1. GB

## 2020-02-06 ENCOUNTER — Telehealth: Payer: Self-pay | Admitting: *Deleted

## 2020-02-06 NOTE — Telephone Encounter (Signed)
Call returned to patient and advised to monitor self and get tested if symptoms develop. We went over symptoms and she agrees to this plan

## 2020-02-06 NOTE — Telephone Encounter (Signed)
Per Dr B, She had her pfizer vaccine. patient she monitor herself for symptoms and tested if she develops any symptoms.

## 2020-02-06 NOTE — Telephone Encounter (Signed)
Patient called reporting that she has has direct contact with someone who is COVID positive and is asking what to do. Please advise

## 2020-02-10 ENCOUNTER — Other Ambulatory Visit: Payer: Self-pay

## 2020-02-10 DIAGNOSIS — D4861 Neoplasm of uncertain behavior of right breast: Secondary | ICD-10-CM

## 2020-02-10 DIAGNOSIS — C50211 Malignant neoplasm of upper-inner quadrant of right female breast: Secondary | ICD-10-CM

## 2020-02-10 DIAGNOSIS — Z171 Estrogen receptor negative status [ER-]: Secondary | ICD-10-CM

## 2020-02-11 ENCOUNTER — Inpatient Hospital Stay: Payer: PPO | Admitting: Internal Medicine

## 2020-02-11 ENCOUNTER — Inpatient Hospital Stay: Payer: PPO

## 2020-02-11 ENCOUNTER — Encounter: Payer: Self-pay | Admitting: Internal Medicine

## 2020-02-11 ENCOUNTER — Inpatient Hospital Stay: Payer: PPO | Attending: Internal Medicine

## 2020-02-11 ENCOUNTER — Other Ambulatory Visit: Payer: Self-pay

## 2020-02-11 DIAGNOSIS — E876 Hypokalemia: Secondary | ICD-10-CM | POA: Insufficient documentation

## 2020-02-11 DIAGNOSIS — Z5189 Encounter for other specified aftercare: Secondary | ICD-10-CM | POA: Insufficient documentation

## 2020-02-11 DIAGNOSIS — Z171 Estrogen receptor negative status [ER-]: Secondary | ICD-10-CM | POA: Insufficient documentation

## 2020-02-11 DIAGNOSIS — C50211 Malignant neoplasm of upper-inner quadrant of right female breast: Secondary | ICD-10-CM

## 2020-02-11 DIAGNOSIS — D4861 Neoplasm of uncertain behavior of right breast: Secondary | ICD-10-CM

## 2020-02-11 DIAGNOSIS — E119 Type 2 diabetes mellitus without complications: Secondary | ICD-10-CM | POA: Diagnosis not present

## 2020-02-11 DIAGNOSIS — Z5111 Encounter for antineoplastic chemotherapy: Secondary | ICD-10-CM | POA: Insufficient documentation

## 2020-02-11 LAB — CBC WITH DIFFERENTIAL/PLATELET
Abs Immature Granulocytes: 0.07 10*3/uL (ref 0.00–0.07)
Basophils Absolute: 0.1 10*3/uL (ref 0.0–0.1)
Basophils Relative: 1 %
Eosinophils Absolute: 0 10*3/uL (ref 0.0–0.5)
Eosinophils Relative: 0 %
HCT: 32.7 % — ABNORMAL LOW (ref 36.0–46.0)
Hemoglobin: 10.9 g/dL — ABNORMAL LOW (ref 12.0–15.0)
Immature Granulocytes: 1 %
Lymphocytes Relative: 22 %
Lymphs Abs: 1.3 10*3/uL (ref 0.7–4.0)
MCH: 31.6 pg (ref 26.0–34.0)
MCHC: 33.3 g/dL (ref 30.0–36.0)
MCV: 94.8 fL (ref 80.0–100.0)
Monocytes Absolute: 0.9 10*3/uL (ref 0.1–1.0)
Monocytes Relative: 15 %
Neutro Abs: 3.6 10*3/uL (ref 1.7–7.7)
Neutrophils Relative %: 61 %
Platelets: 382 10*3/uL (ref 150–400)
RBC: 3.45 MIL/uL — ABNORMAL LOW (ref 3.87–5.11)
RDW: 16.7 % — ABNORMAL HIGH (ref 11.5–15.5)
WBC: 5.9 10*3/uL (ref 4.0–10.5)
nRBC: 0 % (ref 0.0–0.2)

## 2020-02-11 LAB — BASIC METABOLIC PANEL
Anion gap: 13 (ref 5–15)
BUN: 13 mg/dL (ref 8–23)
CO2: 23 mmol/L (ref 22–32)
Calcium: 9.3 mg/dL (ref 8.9–10.3)
Chloride: 102 mmol/L (ref 98–111)
Creatinine, Ser: 0.67 mg/dL (ref 0.44–1.00)
GFR, Estimated: >60 mL/min (ref >60)
Glucose, Bld: 226 mg/dL — ABNORMAL HIGH (ref 70–99)
Potassium: 3.5 mmol/L (ref 3.5–5.1)
Sodium: 138 mmol/L (ref 135–145)

## 2020-02-11 MED ORDER — SODIUM CHLORIDE 0.9 % IV SOLN
10.0000 mg | Freq: Once | INTRAVENOUS | Status: AC
  Administered 2020-02-11: 10 mg via INTRAVENOUS
  Filled 2020-02-11: qty 10

## 2020-02-11 MED ORDER — SODIUM CHLORIDE 0.9% FLUSH
10.0000 mL | INTRAVENOUS | Status: DC | PRN
  Administered 2020-02-11 (×2): 10 mL
  Filled 2020-02-11: qty 10

## 2020-02-11 MED ORDER — SODIUM CHLORIDE 0.9 % IV SOLN
600.0000 mg/m2 | Freq: Once | INTRAVENOUS | Status: AC
  Administered 2020-02-11: 1200 mg via INTRAVENOUS
  Filled 2020-02-11: qty 50

## 2020-02-11 MED ORDER — SODIUM CHLORIDE 0.9 % IV SOLN
150.0000 mg | Freq: Once | INTRAVENOUS | Status: AC
  Administered 2020-02-11: 150 mg via INTRAVENOUS
  Filled 2020-02-11: qty 150

## 2020-02-11 MED ORDER — DOXORUBICIN HCL CHEMO IV INJECTION 2 MG/ML
60.0000 mg/m2 | Freq: Once | INTRAVENOUS | Status: AC
  Administered 2020-02-11: 120 mg via INTRAVENOUS
  Filled 2020-02-11: qty 50

## 2020-02-11 MED ORDER — SODIUM CHLORIDE 0.9 % IV SOLN
Freq: Once | INTRAVENOUS | Status: AC
  Filled 2020-02-11: qty 250

## 2020-02-11 MED ORDER — HEPARIN SOD (PORK) LOCK FLUSH 100 UNIT/ML IV SOLN
INTRAVENOUS | Status: AC
  Filled 2020-02-11: qty 5

## 2020-02-11 MED ORDER — PALONOSETRON HCL INJECTION 0.25 MG/5ML
0.2500 mg | Freq: Once | INTRAVENOUS | Status: AC
  Administered 2020-02-11: 0.25 mg via INTRAVENOUS
  Filled 2020-02-11: qty 5

## 2020-02-11 MED ORDER — HEPARIN SOD (PORK) LOCK FLUSH 100 UNIT/ML IV SOLN
500.0000 [IU] | Freq: Once | INTRAVENOUS | Status: AC | PRN
  Administered 2020-02-11: 500 [IU]
  Filled 2020-02-11: qty 5

## 2020-02-11 NOTE — Progress Notes (Signed)
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 Byrnett.]   # [id number] Carbo-taxol-AC #1  # SURVIVORSHIP: Pending  # GENETICS: Pending  DIAGNOSIS: Right breast cancer triple negative  STAGE:   IIB      ;  GOALS: cure  CURRENT/MOST RECENT THERAPY : carbo-taxol-AC    Carcinoma of upper-inner quadrant of right breast in female, estrogen receptor negative ([place])  10/07/2019 Initial Diagnosis   Carcinoma of upper-inner quadrant of right breast in female, estrogen receptor negative ([place])   12/04/2019 Genetic Testing   Negative genetic testing. No pathogenic variants identified on the [facility] Common Hereditary Cancers Panel + Skin Cancers Panel. The report date is 12/04/2019.  The Common Hereditary Cancers Panel + Skin Cancers Panel offered by [facility] includes sequencing and/or deletion duplication testing of the following 54 genes: APC*, ATM*, AXIN2, BAP1, BARD1, BMPR1A, BRCA1, BRCA2, BRIP1, CDH1, CDK4, CDKN2A (p14ARF), CDKN2A (p16INK4a), CHEK2, CTNNA1, DICER1*, EPCAM*, GREM1*, HOXB13, KIT, MEN1*, MITF*, MLH1*, MSH2*, MSH3*, MSH6*, MUTYH, NBN, NF1*, NTHL1, PALB2, PDGFRA, PMS2*, POLD1*, POLE, POT1, PTCH1, PTEN*, RAD50, RAD51C, RAD51D, RB1*, RNF43, SDHA*, SDHB, SDHC*, SDHD, SMAD4, SMARCA4, STK11, SUFU, TP53, TSC1*, TSC2, VHL.       HISTORY OF PRESENTING ILLNESS:  Ashlee Cox 69 y.o.  female patient with triple negative breast cancer stage IIb neoadjuvant chemotherapy is here for follow-up.  In the interim patient was exposed to family member who had Covid positive.  Patient is vaccinated.  Did not have any respiratory symptoms.  Patient is currently status post cycle #1 of Adriamycin Cytoxan.  Patient tolerated chemotherapy  fairly well.  No nausea no vomiting.  Mild tingling and numbness.  Not any worse.  Review of Systems  Constitutional: Positive for malaise/fatigue. Negative for chills, diaphoresis, fever and weight loss.  HENT: Negative for nosebleeds and sore throat.   Eyes: Negative for double vision.  Respiratory: Negative for cough, hemoptysis, sputum production and wheezing.   Cardiovascular: Negative for chest pain, palpitations, orthopnea and leg swelling.  Gastrointestinal: Negative for abdominal pain, blood in stool, constipation and melena.  Genitourinary: Negative for dysuria, frequency and urgency.  Musculoskeletal: Negative for back pain and joint pain.  Skin: Negative.  Negative for itching and rash.  Neurological: Positive for tingling. Negative for dizziness, focal weakness, weakness and headaches.  Endo/Heme/Allergies: Does not bruise/bleed easily.  Psychiatric/Behavioral: Negative for depression. The patient is not nervous/anxious and does not have insomnia.      MEDICAL HISTORY:  Past Medical History:  Diagnosis Date  . Cyst, breast    benign  . Family history of melanoma   . Family history of ovarian cancer   . GERD (gastroesophageal reflux disease)   . H/O: rheumatic fever   . 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 Hyperlipidemia   . Hypertension   . Menopause    at age 45  . Personal history of chemotherapy 09/2019   RIGHT  INVASIVE MAMMARY CARCINOMA    SURGICAL HISTORY: Past Surgical History:  Procedure Laterality Date  . APPENDECTOMY  2006  . BREAST BIOPSY Right 09/22/2019   INVASIVE MAMMARY CARCINOMA  . BREAST CYST ASPIRATION Left 1999  . CHOLECYSTECTOMY  2006  . PORTACATH PLACEMENT Left 10/06/2019  Procedure: INSERTION PORT-A-CATH;  Surgeon: Robert Bellow, MD;  Location: [facility] ORS;  Service: General;  Laterality: Left;  . SUBMUCOSAL TATTOO INJECTION Right 10/06/2019   Procedure: Right axillary TATTOO  INJECTION;  Surgeon: Robert Bellow, MD;  Location: [facility] ORS;  Service: General;  Laterality: Right;  Marland Kitchen VAGINAL DELIVERY     x3    SOCIAL HISTORY: Social History   Socioeconomic History  . Marital status: Widowed    Spouse name: Not on file  . Number of children: Not on file  . Years of education: Not on file  . Highest education level: Not on file  Occupational History  . Occupation: Glass blower/designer: [id number]  Tobacco Use  . Smoking status: Former Smoker    Years: 1.00    Types: Cigarettes    Quit date: 12/26/2005    Years since quitting: 14.1  . Smokeless tobacco: Never Used  . Tobacco comment: smoked for less than 1 years,  1 cig/day  Substance and Sexual Activity  . Alcohol use: No  . Drug use: No  . Sexual activity: Never    Partners: Female  Other Topics Concern  . Not on file  Social History Narrative   Widowed, March 2014; lives with fiance.      [employer] ret[employer]; quit smoking 1ppw; no alcohol.       Social Determinants of Health   Financial Resource Strain:   . Difficulty of Paying Living Expenses: Not on file  Food Insecurity:   . Worried About Charity fundraiser in the Last Year: Not on file  . Ran Out of Food in the Last Year: Not on file  Transportation Needs:   . Lack of Transportation (Medical): Not on file  . Lack of Transportation (Non-Medical): Not on file  Physical Activity:   . Days of Exercise per Week: Not on file  . Minutes of Exercise per Session: Not on file  Stress:   . Feeling of Stress : Not on file  Social Connections:   . Frequency of Communication with Friends and Family: Not on file  . Frequency of Social Gatherings with Friends and Family: Not on file  . Attends Religious Services: Not on file  . Active Member of Clubs or Organizations: Not on file  . Attends Archivist Meetings: Not on file  . Marital Status: Not on file  Intimate Partner Violence:   . Fear of Current or Ex-Partner: Not on file  . Emotionally  Abused: Not on file  . Physically Abused: Not on file  . Sexually Abused: Not on file    FAMILY HISTORY: Family History  Problem Relation Age of Onset  . Cancer Mother 80       ovarian- died 4-5 years.   . Heart disease Mother 78  . Diabetes Mother   . Stroke Father 52  . Diabetes Father   . Diabetes Sister   . Melanoma Sister        survived  . Breast cancer Neg Hx     ALLERGIES:  has No Known Allergies.  MEDICATIONS:  Current Outpatient Medications  Medication Sig Dispense Refill  . gabapentin (NEURONTIN) 100 MG capsule Take 1 capsule (100 mg total) by mouth at bedtime. 30 capsule 1  . losartan-hydrochlorothiazide (HYZAAR) 50-12.5 MG tablet Take 1 tablet by mouth daily. 90 tablet 3  . lovastatin (MEVACOR) 40 MG tablet TAKE 1 TABLET BY MOUTH AT BEDTIME (Patient taking differently: Take 40 mg by mouth in the  morning. ) 90 tablet 3  . metFORMIN (GLUCOPHAGE) 500 MG tablet Take 2 tablets (1,000 mg total) by mouth 2 (two) times daily with a meal. 120 tablet 3  . Multiple Vitamin (MULTIVITAMIN) tablet Take 1 tablet by mouth daily.      Marland Kitchen omeprazole (PRILOSEC) 20 MG capsule Take 1 capsule by mouth once daily 90 capsule 0  . ondansetron (ZOFRAN) 8 MG tablet One pill every 8 hours as needed for nausea/vomitting. (Patient not taking: Reported on 10/27/2019) 40 tablet 1  . prochlorperazine (COMPAZINE) 10 MG tablet Take 1 tablet (10 mg total) by mouth every 6 (six) hours as needed for nausea or vomiting. (Patient not taking: Reported on 10/27/2019) 40 tablet 1   No current facility-administered medications for this visit.   Facility-Administered Medications Ordered in Other Visits  Medication Dose Route Frequency Provider Last Rate Last Admin  . sodium chloride flush (NS) 0.9 % injection 10 mL  10 mL Intracatheter PRN Cammie Sickle, MD   10 mL at 02/11/20 1218      .  PHYSICAL EXAMINATION: ECOG PERFORMANCE STATUS: 0 - Asymptomatic  Vitals:   02/11/20 0956  BP: (!) 143/75   Pulse: 97  Resp: 16  Temp: 98.5 F (36.9 C)  SpO2: 99%   Filed Weights   02/11/20 0956  Weight: 191 lb 12.8 oz (87 kg)    Physical Exam HENT:     Head: Normocephalic and atraumatic.     Mouth/Throat:     Pharynx: No oropharyngeal exudate.  Eyes:     Pupils: Pupils are equal, round, and reactive to light.  Cardiovascular:     Rate and Rhythm: Normal rate and regular rhythm.  Pulmonary:     Effort: No respiratory distress.     Breath sounds: No wheezing.  Abdominal:     General: Bowel sounds are normal. There is no distension.     Palpations: Abdomen is soft. There is no mass.     Tenderness: There is no abdominal tenderness. There is no guarding or rebound.  Musculoskeletal:        General: No tenderness. Normal range of motion.     Cervical back: Normal range of motion and neck supple.  Skin:    General: Skin is warm.     Comments: Right breast-right upper quadrant vague approximately 1 to 2 cm mass felt.  No skin changes.  No nipple changes. (In presence of chaperone)   Neurological:     Mental Status: She is alert and oriented to person, place, and time.  Psychiatric:        Mood and Affect: Affect normal.      LABORATORY DATA:  I have reviewed the data as listed Lab Results  Component Value Date   WBC 5.9 02/11/2020   HGB 10.9 (L) 02/11/2020   HCT 32.7 (L) 02/11/2020   MCV 94.8 02/11/2020   PLT 382 02/11/2020   Recent Labs    11/18/19 0823 12/02/19 0922 12/02/19 0922 12/09/19 0844 12/23/19 0846 12/30/19 0839 01/07/20 0840 01/21/20 0842 02/11/20 0933  NA   < > 144   < > 140 140   < > 138 137 138  K   < > 3.4*   < > 3.3* 3.3*   < > 3.0* 3.6 3.5  CL   < > 106   < > 104 104   < > 104 104 102  CO2   < > 26   < > 23 24   < >  23 21* 23  GLUCOSE   < > 194*   < > 204* 237*   < > 196* 175* 226*  BUN   < > 10   < > 13 10   < > 15 16 13   CREATININE   < > 0.68   < > 0.79 0.77   < > 0.86 0.77 0.67  CALCIUM   < > 9.1   < > 8.9 9.1   < > 8.2* 8.8* 9.3   GFRNONAA   < > >60   < > >60 >60   < > >60 >60 >60  GFRAA  --  >60  --  >60 >60  --   --   --   --   PROT   < >  --   --   --  6.7  --  6.5 6.5  --   ALBUMIN   < >  --   --   --  3.8  --  3.8 3.8  --   AST   < >  --   --   --  56*  --  54* 61*  --   ALT   < >  --   --   --  43  --  49* 47*  --   ALKPHOS   < >  --   --   --  66  --  66 67  --   BILITOT   < >  --   --   --  0.6  --  0.7 0.8  --    < > = values in this interval not displayed.    RADIOGRAPHIC STUDIES: I have personally reviewed the radiological images as listed and agreed with the findings in the report. No results found.  ASSESSMENT & PLAN:   Carcinoma of upper-inner quadrant of right breast in female, estrogen receptor negative ([place]) # Right breast cancer-Stage IIB- T2N1-TRIPLE NEGATIVE; PET July 11th-right breast mass right underarm lymphadenopathy.  Partial response noted on physical exam.  US/mammo- Sep 24th- smaller mass/ response noted.  STABLE   # proceed with AC chemo cycle #2 chemo today; Labs today reviewed.    #Mild hypokalemia-dietary supplementation potassium 3.5.; STABLE.   # DM-post breakfast blood sugars-226 STABLE;  continue metformin 1000 mg BID.   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   #Port malfunction-dye study September, 2021 possible fibrin sheath.  S/p TPA-resolved.  # DISPOSITION: #Chemotherapy today; Fulphila tomorrow # 3 weeks- MD; labs-cbc/cmp; Adriamycin-Cytoxan; D-2-fulphila-Dr.B     All questions were answered. The patient/family knows to call the clinic with any problems, questions or concerns.   Cammie Sickle, MD 02/11/2020 5:18 PM

## 2020-02-11 NOTE — Assessment & Plan Note (Signed)
#   Right breast cancer-Stage IIB- T2N1-TRIPLE NEGATIVE; PET July 11th-right breast mass right underarm lymphadenopathy.  Partial response noted on physical exam.  US/mammo- Sep 24th- smaller mass/ response noted.  STABLE   # proceed with AC chemo cycle #2 chemo today; Labs today reviewed.    #Mild hypokalemia-dietary supplementation potassium 3.5.; STABLE.   # DM-post breakfast blood sugars-226 STABLE;  continue metformin 1000 mg BID.   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   #Port malfunction-dye study September, 2021 possible fibrin sheath.  S/p TPA-resolved.  # DISPOSITION: #Chemotherapy today; Fulphila tomorrow # 3 weeks- MD; labs-cbc/cmp; Adriamycin-Cytoxan; D-2-fulphila-Dr.B

## 2020-02-12 ENCOUNTER — Inpatient Hospital Stay: Payer: PPO

## 2020-02-12 DIAGNOSIS — D4861 Neoplasm of uncertain behavior of right breast: Secondary | ICD-10-CM

## 2020-02-12 DIAGNOSIS — Z5111 Encounter for antineoplastic chemotherapy: Secondary | ICD-10-CM | POA: Diagnosis not present

## 2020-02-12 MED ORDER — PEGFILGRASTIM-JMDB 6 MG/0.6ML ~~LOC~~ SOSY
6.0000 mg | PREFILLED_SYRINGE | Freq: Once | SUBCUTANEOUS | Status: AC
  Administered 2020-02-12: 6 mg via SUBCUTANEOUS
  Filled 2020-02-12: qty 0.6

## 2020-02-27 ENCOUNTER — Other Ambulatory Visit: Payer: Self-pay

## 2020-02-27 DIAGNOSIS — C50211 Malignant neoplasm of upper-inner quadrant of right female breast: Secondary | ICD-10-CM

## 2020-02-27 DIAGNOSIS — Z171 Estrogen receptor negative status [ER-]: Secondary | ICD-10-CM

## 2020-03-03 ENCOUNTER — Inpatient Hospital Stay: Payer: PPO

## 2020-03-03 ENCOUNTER — Inpatient Hospital Stay: Payer: PPO | Admitting: Internal Medicine

## 2020-03-03 ENCOUNTER — Inpatient Hospital Stay: Payer: PPO | Attending: Internal Medicine

## 2020-03-03 VITALS — BP 150/79 | HR 87 | Resp 16

## 2020-03-03 DIAGNOSIS — Z87891 Personal history of nicotine dependence: Secondary | ICD-10-CM | POA: Diagnosis not present

## 2020-03-03 DIAGNOSIS — Z006 Encounter for examination for normal comparison and control in clinical research program: Secondary | ICD-10-CM | POA: Insufficient documentation

## 2020-03-03 DIAGNOSIS — Z5189 Encounter for other specified aftercare: Secondary | ICD-10-CM | POA: Diagnosis not present

## 2020-03-03 DIAGNOSIS — C50211 Malignant neoplasm of upper-inner quadrant of right female breast: Secondary | ICD-10-CM

## 2020-03-03 DIAGNOSIS — Z171 Estrogen receptor negative status [ER-]: Secondary | ICD-10-CM | POA: Diagnosis not present

## 2020-03-03 DIAGNOSIS — G620 Drug-induced polyneuropathy: Secondary | ICD-10-CM | POA: Diagnosis not present

## 2020-03-03 DIAGNOSIS — E119 Type 2 diabetes mellitus without complications: Secondary | ICD-10-CM | POA: Insufficient documentation

## 2020-03-03 DIAGNOSIS — D4861 Neoplasm of uncertain behavior of right breast: Secondary | ICD-10-CM

## 2020-03-03 DIAGNOSIS — Z5111 Encounter for antineoplastic chemotherapy: Secondary | ICD-10-CM | POA: Insufficient documentation

## 2020-03-03 DIAGNOSIS — Z7984 Long term (current) use of oral hypoglycemic drugs: Secondary | ICD-10-CM

## 2020-03-03 DIAGNOSIS — T451X5A Adverse effect of antineoplastic and immunosuppressive drugs, initial encounter: Secondary | ICD-10-CM | POA: Diagnosis not present

## 2020-03-03 DIAGNOSIS — E876 Hypokalemia: Secondary | ICD-10-CM | POA: Insufficient documentation

## 2020-03-03 LAB — CBC WITH DIFFERENTIAL/PLATELET
Abs Immature Granulocytes: 0.03 10*3/uL (ref 0.00–0.07)
Basophils Absolute: 0.1 10*3/uL (ref 0.0–0.1)
Basophils Relative: 1 %
Eosinophils Absolute: 0 10*3/uL (ref 0.0–0.5)
Eosinophils Relative: 1 %
HCT: 32.6 % — ABNORMAL LOW (ref 36.0–46.0)
Hemoglobin: 11 g/dL — ABNORMAL LOW (ref 12.0–15.0)
Immature Granulocytes: 1 %
Lymphocytes Relative: 18 %
Lymphs Abs: 1.1 10*3/uL (ref 0.7–4.0)
MCH: 32.2 pg (ref 26.0–34.0)
MCHC: 33.7 g/dL (ref 30.0–36.0)
MCV: 95.3 fL (ref 80.0–100.0)
Monocytes Absolute: 0.7 10*3/uL (ref 0.1–1.0)
Monocytes Relative: 11 %
Neutro Abs: 4.2 10*3/uL (ref 1.7–7.7)
Neutrophils Relative %: 68 %
Platelets: 272 10*3/uL (ref 150–400)
RBC: 3.42 MIL/uL — ABNORMAL LOW (ref 3.87–5.11)
RDW: 17.3 % — ABNORMAL HIGH (ref 11.5–15.5)
WBC: 6.1 10*3/uL (ref 4.0–10.5)
nRBC: 0 % (ref 0.0–0.2)

## 2020-03-03 LAB — COMPREHENSIVE METABOLIC PANEL
ALT: 46 U/L — ABNORMAL HIGH (ref 0–44)
AST: 58 U/L — ABNORMAL HIGH (ref 15–41)
Albumin: 3.9 g/dL (ref 3.5–5.0)
Alkaline Phosphatase: 72 U/L (ref 38–126)
Anion gap: 14 (ref 5–15)
BUN: 15 mg/dL (ref 8–23)
CO2: 25 mmol/L (ref 22–32)
Calcium: 8.8 mg/dL — ABNORMAL LOW (ref 8.9–10.3)
Chloride: 100 mmol/L (ref 98–111)
Creatinine, Ser: 0.89 mg/dL (ref 0.44–1.00)
GFR, Estimated: >60 mL/min (ref >60)
Glucose, Bld: 184 mg/dL — ABNORMAL HIGH (ref 70–99)
Potassium: 3.2 mmol/L — ABNORMAL LOW (ref 3.5–5.1)
Sodium: 139 mmol/L (ref 135–145)
Total Bilirubin: 0.6 mg/dL (ref 0.3–1.2)
Total Protein: 6.6 g/dL (ref 6.5–8.1)

## 2020-03-03 MED ORDER — SODIUM CHLORIDE 0.9 % IV SOLN
600.0000 mg/m2 | Freq: Once | INTRAVENOUS | Status: AC
  Administered 2020-03-03: 1200 mg via INTRAVENOUS
  Filled 2020-03-03: qty 50

## 2020-03-03 MED ORDER — PALONOSETRON HCL INJECTION 0.25 MG/5ML
0.2500 mg | Freq: Once | INTRAVENOUS | Status: AC
  Administered 2020-03-03: 0.25 mg via INTRAVENOUS
  Filled 2020-03-03: qty 5

## 2020-03-03 MED ORDER — SODIUM CHLORIDE 0.9 % IV SOLN
10.0000 mg | Freq: Once | INTRAVENOUS | Status: AC
  Administered 2020-03-03: 10 mg via INTRAVENOUS
  Filled 2020-03-03: qty 10

## 2020-03-03 MED ORDER — HEPARIN SOD (PORK) LOCK FLUSH 100 UNIT/ML IV SOLN
INTRAVENOUS | Status: AC
  Filled 2020-03-03: qty 5

## 2020-03-03 MED ORDER — SODIUM CHLORIDE 0.9 % IV SOLN
Freq: Once | INTRAVENOUS | Status: AC
  Filled 2020-03-03: qty 250

## 2020-03-03 MED ORDER — DOXORUBICIN HCL CHEMO IV INJECTION 2 MG/ML
60.0000 mg/m2 | Freq: Once | INTRAVENOUS | Status: AC
  Administered 2020-03-03: 120 mg via INTRAVENOUS
  Filled 2020-03-03: qty 50

## 2020-03-03 MED ORDER — HEPARIN SOD (PORK) LOCK FLUSH 100 UNIT/ML IV SOLN
500.0000 [IU] | Freq: Once | INTRAVENOUS | Status: AC | PRN
  Administered 2020-03-03: 500 [IU]
  Filled 2020-03-03: qty 5

## 2020-03-03 MED ORDER — SODIUM CHLORIDE 0.9 % IV SOLN
150.0000 mg | Freq: Once | INTRAVENOUS | Status: AC
  Administered 2020-03-03: 150 mg via INTRAVENOUS
  Filled 2020-03-03: qty 150

## 2020-03-03 NOTE — Progress Notes (Signed)
[facility] NOTE  Patient Care Team: [name], [name] Jeral Fruit, RN as Registered Nurse  CHIEF COMPLAINTS/PURPOSE OF CONSULTATION: Breast cancer  #  Oncology History Overview Note  #Right breast cancer-T2N1; 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 Byrnett.]   # [id number] Carbo-taxol-AC #1  # SURVIVORSHIP: Pending  # GENETICS: Pending  DIAGNOSIS: Right breast cancer triple negative  STAGE:   IIB      ;  GOALS: cure  CURRENT/MOST RECENT THERAPY : carbo-taxol-AC    Carcinoma of upper-inner quadrant of right breast in female, estrogen receptor negative ([place])  10/07/2019 Initial Diagnosis   Carcinoma of upper-inner quadrant of right breast in female, estrogen receptor negative ([place])   12/04/2019 Genetic Testing   Negative genetic testing. No pathogenic variants identified on the [facility] Common Hereditary Cancers Panel + Skin Cancers Panel. The report date is 12/04/2019.  The Common Hereditary Cancers Panel + Skin Cancers Panel offered by [facility] includes sequencing and/or deletion duplication testing of the following 54 genes: APC*, ATM*, AXIN2, BAP1, BARD1, BMPR1A, BRCA1, BRCA2, BRIP1, CDH1, CDK4, CDKN2A (p14ARF), CDKN2A (p16INK4a), CHEK2, CTNNA1, DICER1*, EPCAM*, GREM1*, HOXB13, KIT, MEN1*, MITF*, MLH1*, MSH2*, MSH3*, MSH6*, MUTYH, NBN, NF1*, NTHL1, PALB2, PDGFRA, PMS2*, POLD1*, POLE, POT1, PTCH1, PTEN*, RAD50, RAD51C, RAD51D, RB1*, RNF43, SDHA*, SDHB, SDHC*, SDHD, SMAD4, SMARCA4, STK11, SUFU, TP53, TSC1*, TSC2, VHL.       HISTORY OF PRESENTING ILLNESS:  Ashlee Cox 69 y.o.  female patient with triple negative breast cancer stage IIb neoadjuvant chemotherapy is here for follow-up.   Patient is currently status post cycle #3 of Adriamycin Cytoxan.   Patient continues to complain of tingling and numbness in the fingertips and toes.  Fairly stable.  She is not stumbling or falling.  Review of Systems   Constitutional: Positive for malaise/fatigue. Negative for chills, diaphoresis, fever and weight loss.  HENT: Negative for nosebleeds and sore throat.   Eyes: Negative for double vision.  Respiratory: Negative for cough, hemoptysis, sputum production and wheezing.   Cardiovascular: Negative for chest pain, palpitations, orthopnea and leg swelling.  Gastrointestinal: Negative for abdominal pain, blood in stool, constipation and melena.  Genitourinary: Negative for dysuria, frequency and urgency.  Musculoskeletal: Negative for back pain and joint pain.  Skin: Negative.  Negative for itching and rash.  Neurological: Positive for tingling. Negative for dizziness, focal weakness, weakness and headaches.  Endo/Heme/Allergies: Does not bruise/bleed easily.  Psychiatric/Behavioral: Negative for depression. The patient is not nervous/anxious and does not have insomnia.      MEDICAL HISTORY:  Past Medical History:  Diagnosis Date  . Cyst, breast    benign  . Family history of melanoma   . Family history of ovarian cancer   . GERD (gastroesophageal reflux disease)   . H/O: rheumatic fever   . 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 Hyperlipidemia   . Hypertension   . Menopause    at age 34  . Personal history of chemotherapy 09/2019   RIGHT  INVASIVE MAMMARY CARCINOMA    SURGICAL HISTORY: Past Surgical History:  Procedure Laterality Date  . APPENDECTOMY  2006  . BREAST BIOPSY Right 09/22/2019   INVASIVE MAMMARY CARCINOMA  . BREAST CYST ASPIRATION Left 1999  . CHOLECYSTECTOMY  2006  . PORTACATH PLACEMENT Left 10/06/2019   Procedure: INSERTION PORT-A-CATH;  Surgeon: Robert Bellow, MD;  Location: [facility] ORS;  Service: General;  Laterality:  Left;  . SUBMUCOSAL TATTOO INJECTION Right 10/06/2019   Procedure: Right axillary TATTOO INJECTION;  Surgeon: Robert Bellow, MD;  Location: [facility] ORS;  Service: General;  Laterality: Right;   Marland Kitchen VAGINAL DELIVERY     x3    SOCIAL HISTORY: Social History   Socioeconomic History  . Marital status: Widowed    Spouse name: Not on file  . Number of children: Not on file  . Years of education: Not on file  . Highest education level: Not on file  Occupational History  . Occupation: Glass blower/designer: [id number]  Tobacco Use  . Smoking status: Former Smoker    Years: 1.00    Types: Cigarettes    Quit date: 12/26/2005    Years since quitting: 14.1  . Smokeless tobacco: Never Used  . Tobacco comment: smoked for less than 1 years,  1 cig/day  Substance and Sexual Activity  . Alcohol use: No  . Drug use: No  . Sexual activity: Never    Partners: Female  Other Topics Concern  . Not on file  Social History Narrative   Widowed, March 2014; lives with fiance.      [employer] ret[employer]; quit smoking 1ppw; no alcohol.       Social Determinants of Health   Financial Resource Strain:   . Difficulty of Paying Living Expenses: Not on file  Food Insecurity:   . Worried About Charity fundraiser in the Last Year: Not on file  . Ran Out of Food in the Last Year: Not on file  Transportation Needs:   . Lack of Transportation (Medical): Not on file  . Lack of Transportation (Non-Medical): Not on file  Physical Activity:   . Days of Exercise per Week: Not on file  . Minutes of Exercise per Session: Not on file  Stress:   . Feeling of Stress : Not on file  Social Connections:   . Frequency of Communication with Friends and Family: Not on file  . Frequency of Social Gatherings with Friends and Family: Not on file  . Attends Religious Services: Not on file  . Active Member of Clubs or Organizations: Not on file  . Attends Archivist Meetings: Not on file  . Marital Status: Not on file  Intimate Partner Violence:   . Fear of Current or Ex-Partner: Not on file  . Emotionally Abused: Not on file  . Physically Abused: Not on file  . Sexually Abused: Not on file    FAMILY  HISTORY: Family History  Problem Relation Age of Onset  . Cancer Mother 21       ovarian- died 4-5 years.   . Heart disease Mother 67  . Diabetes Mother   . Stroke Father 17  . Diabetes Father   . Diabetes Sister   . Melanoma Sister        survived  . Breast cancer Neg Hx     ALLERGIES:  has No Known Allergies.  MEDICATIONS:  Current Outpatient Medications  Medication Sig Dispense Refill  . gabapentin (NEURONTIN) 100 MG capsule Take 1 capsule (100 mg total) by mouth at bedtime. 30 capsule 1  . losartan-hydrochlorothiazide (HYZAAR) 50-12.5 MG tablet Take 1 tablet by mouth daily. 90 tablet 3  . lovastatin (MEVACOR) 40 MG tablet TAKE 1 TABLET BY MOUTH AT BEDTIME (Patient taking differently: Take 40 mg by mouth in the morning. ) 90 tablet 3  . metFORMIN (GLUCOPHAGE) 500 MG tablet Take 2 tablets (1,000 mg total)  by mouth 2 (two) times daily with a meal. 120 tablet 3  . Multiple Vitamin (MULTIVITAMIN) tablet Take 1 tablet by mouth daily.      Marland Kitchen omeprazole (PRILOSEC) 20 MG capsule Take 1 capsule by mouth once daily 90 capsule 0  . ondansetron (ZOFRAN) 8 MG tablet One pill every 8 hours as needed for nausea/vomitting. (Patient not taking: Reported on 10/27/2019) 40 tablet 1  . prochlorperazine (COMPAZINE) 10 MG tablet Take 1 tablet (10 mg total) by mouth every 6 (six) hours as needed for nausea or vomiting. (Patient not taking: Reported on 10/27/2019) 40 tablet 1   No current facility-administered medications for this visit.      Marland Kitchen  PHYSICAL EXAMINATION: ECOG PERFORMANCE STATUS: 0 - Asymptomatic  Vitals:   03/03/20 0938  BP: (!) 149/74  Pulse: 95  Resp: 16  Temp: (!) 97.3 F (36.3 C)  SpO2: 100%   Filed Weights   03/03/20 0938  Weight: 191 lb 3.2 oz (86.7 kg)    Physical Exam HENT:     Head: Normocephalic and atraumatic.     Mouth/Throat:     Pharynx: No oropharyngeal exudate.  Eyes:     Pupils: Pupils are equal, round, and reactive to light.  Cardiovascular:     Rate  and Rhythm: Normal rate and regular rhythm.  Pulmonary:     Effort: No respiratory distress.     Breath sounds: No wheezing.  Abdominal:     General: Bowel sounds are normal. There is no distension.     Palpations: Abdomen is soft. There is no mass.     Tenderness: There is no abdominal tenderness. There is no guarding or rebound.  Musculoskeletal:        General: No tenderness. Normal range of motion.     Cervical back: Normal range of motion and neck supple.  Skin:    General: Skin is warm.     Comments: Right breast-right upper quadrant vague approximately 1  mass felt.  No skin changes.  No nipple changes. (In presence of chaperone)   Neurological:     Mental Status: She is alert and oriented to person, place, and time.  Psychiatric:        Mood and Affect: Affect normal.      LABORATORY DATA:  I have reviewed the data as listed Lab Results  Component Value Date   WBC 6.1 03/03/2020   HGB 11.0 (L) 03/03/2020   HCT 32.6 (L) 03/03/2020   MCV 95.3 03/03/2020   PLT 272 03/03/2020   Recent Labs    11/18/19 0823 12/02/19 0922 12/02/19 0922 12/09/19 0844 12/23/19 0846 12/30/19 0839 01/07/20 0840 01/07/20 0840 01/21/20 0842 02/11/20 0933 03/03/20 0911  NA   < > 144   < > 140 140   < > 138   < > 137 138 139  K   < > 3.4*   < > 3.3* 3.3*   < > 3.0*   < > 3.6 3.5 3.2*  CL   < > 106   < > 104 104   < > 104   < > 104 102 100  CO2   < > 26   < > 23 24   < > 23   < > 21* 23 25  GLUCOSE   < > 194*   < > 204* 237*   < > 196*   < > 175* 226* 184*  BUN   < > 10   < > 13  10   < > 15   < > 16 13 15   CREATININE   < > 0.68   < > 0.79 0.77   < > 0.86   < > 0.77 0.67 0.89  CALCIUM   < > 9.1   < > 8.9 9.1   < > 8.2*   < > 8.8* 9.3 8.8*  GFRNONAA   < > >60   < > >60 >60   < > >60   < > >60 >60 >60  GFRAA  --  >60  --  >60 >60  --   --   --   --   --   --   PROT   < >  --   --   --  6.7  --  6.5  --  6.5  --  6.6  ALBUMIN   < >  --   --   --  3.8  --  3.8  --  3.8  --  3.9  AST   <  >  --   --   --  56*  --  54*  --  61*  --  58*  ALT   < >  --   --   --  43  --  49*  --  47*  --  46*  ALKPHOS   < >  --   --   --  66  --  66  --  67  --  72  BILITOT   < >  --   --   --  0.6  --  0.7  --  0.8  --  0.6   < > = values in this interval not displayed.    RADIOGRAPHIC STUDIES: I have personally reviewed the radiological images as listed and agreed with the findings in the report. No results found.  ASSESSMENT & PLAN:   Carcinoma of upper-inner quadrant of right breast in female, estrogen receptor negative ([place]) # Right breast cancer-Stage IIB- T2N1-TRIPLE NEGATIVE; PET July 11th-right breast mass right underarm lymphadenopathy.  Partial response noted on physical exam.  US/mammo- Sep 24th- smaller mass/ response noted.  Stable.   # proceed with AC chemo cycle #3 chemo today; Labs today reviewed.  Discussed with Dr. Bary Castilla; regarding follow-up appointments post neoadjuvant chemotherapy.  Last cycle of chemotherapy on 12/29.   # Mild hypokalemia-dietary supplementation potassium 3.2.   # DM-post breakfast blood sugars -184;  STABLE.   continue metformin 1000 mg BID.   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   #Port malfunction-dye study September, 2021 possible fibrin sheath.  S/p TPA-resolved.  # DISPOSITION: #Chemotherapy today; Fulphila tomorrow # 3 weeks- MD; labs-cbc/cmp; Adriamycin-Cytoxan; D-2-fulphila-Dr.B     All questions were answered. The patient/family knows to call the clinic with any problems, questions or concerns.   Cammie Sickle, MD 03/03/2020 6:59 PM

## 2020-03-03 NOTE — Research (Signed)
SWOG [id number] 24 Week Visit:  Patient in to the cancer center with her husband this morning for her scheduled 24 week visit for the SWOG S1714 protocol. Research labs were drawn in the infusion room this am at 0925 am via her port without any difficulties. The vials were transported to the lab for processing immediately after they were drawn by Mauricio Po, CRS, instructions for processing were provided to [facility] and Maudie Mercury. The patient was provided her study questionnaires for her to complete prior to her visit with Dr. Rogue Bussing. The patient was escorted to room 17 and research nurse met with patient at this time to review her adverse events and perform the neuropathy assessments per protocol requirements. The patient is complaining of numbness and tingling to both hands, states her feet feel "swollen" when she stands up. This causes the floor to feel funny under her feet. She denies having any falls in the last six months.  The patient was cautioned to take her time when standing and be very careful so as to prevent falling, which could be an adverse effect of the neuropathy. The patient states that she is having difficulty opening bottles and lids, her husband has to help her with this. She is not dropping any items and is able to perform all her activities of daily living without assistance. The monofilament and Neurotip exams were completed on her dominant right side, with the assistance of [employer] for documentation and timing. The patient was unable to feel the monofilament at site number 10 on the bottom of her heel. She didn't feel any sharpness with the Neurotip at site 1, 7, and 9. The tuning fork assessment was then completed with the assistance of [employer], CRS for timing of sensation. The patient could not feel the tuning fork in the patellar region or medial malleolus of her right leg and foot. She was able to feel the sensations in her right hand and arm but for short amounts of time. Dr.  Rogue Bussing examined the patient and reviewed her labs for her treatment today. He states all labs are good for her to receive her treatment. The patient was encouraged by Dr. Rogue Bussing to eat foods high in potassium as her potassium level was low at 2.2 today. Food examples provided for the patient to increase her K+ were bananas raisins, tomatoes, potatoes and spinach. Dr. Rogue Bussing performed a breast exam today witnessed by this research RN and states he still feels the lump in her right breast but it was much smaller. Dr. Rogue Bussing completed the solicited neuropathy events form and the follow up physician assessment form with grade 2 events attributed to Taxol treatment listed below and her toxicity burden at a "3". The research RN reviewed the study / protocol schedule and the patient is aware that her next visit for the study will be in 6 months at her 52 week time point. The patient was encouraged to call the research RN if she has any questions or concerns. The research RN spent approximately one hour with the patient today in completion of research related protocol activities.  Jeral Fruit, RN, BSN, OCN Date: 03/03/20 Time: 10:49 AM   Sallye Lat [id number] 03/03/2020  Adverse Event Log  Study/Protocol: SWOG X9147 Cycle: 24 Weeks  Event Grade Onset Date Resolved Date Drug Name Attribution Treatment Comments  Dysesthesia 1 2 12/02/2019 03/03/2020 03/02/2020  Taxol 5    Neuralgia 0    0    Parasthesia 1 2 12/02/2019 03/03/2020 03/02/2020  Taxol 5    Peripheral motor neuropathy 0    0    Peripheral sensory neuropathy 1 2 12/02/2019 03/03/2020 03/02/2020  Taxol 5              Jeral Fruit, RN, BSN, OCN  03/03/20 10:26 AM  1051 am

## 2020-03-03 NOTE — Assessment & Plan Note (Addendum)
#   Right breast cancer-Stage IIB- T2N1-TRIPLE NEGATIVE; PET July 11th-right breast mass right underarm lymphadenopathy.  Partial response noted on physical exam.  US/mammo- Sep 24th- smaller mass/ response noted.  Stable.   # proceed with AC chemo cycle #3 chemo today; Labs today reviewed.  Discussed with Dr. Bary Castilla; regarding follow-up appointments post neoadjuvant chemotherapy.  Last cycle of chemotherapy on 12/29.   # Mild hypokalemia-dietary supplementation potassium 3.2.   # DM-post breakfast blood sugars -184;  STABLE.   continue metformin 1000 mg BID.   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   #Port malfunction-dye study September, 2021 possible fibrin sheath.  S/p TPA-resolved.  # DISPOSITION: #Chemotherapy today; Fulphila tomorrow # 3 weeks- MD; labs-cbc/cmp; Adriamycin-Cytoxan; D-2-fulphila-Dr.B

## 2020-03-03 NOTE — Progress Notes (Signed)
Patient tolerated infusion well. Patient and VSS. Discharged home  

## 2020-03-04 ENCOUNTER — Inpatient Hospital Stay: Payer: PPO

## 2020-03-04 DIAGNOSIS — Z5111 Encounter for antineoplastic chemotherapy: Secondary | ICD-10-CM | POA: Diagnosis not present

## 2020-03-04 DIAGNOSIS — D4861 Neoplasm of uncertain behavior of right breast: Secondary | ICD-10-CM

## 2020-03-04 MED ORDER — PEGFILGRASTIM-JMDB 6 MG/0.6ML ~~LOC~~ SOSY
6.0000 mg | PREFILLED_SYRINGE | Freq: Once | SUBCUTANEOUS | Status: AC
  Administered 2020-03-04: 6 mg via SUBCUTANEOUS
  Filled 2020-03-04: qty 0.6

## 2020-03-05 ENCOUNTER — Other Ambulatory Visit: Payer: Self-pay | Admitting: Internal Medicine

## 2020-03-12 ENCOUNTER — Other Ambulatory Visit: Payer: Self-pay | Admitting: Internal Medicine

## 2020-03-24 ENCOUNTER — Inpatient Hospital Stay: Payer: PPO | Admitting: Oncology

## 2020-03-24 ENCOUNTER — Inpatient Hospital Stay: Payer: PPO

## 2020-03-24 ENCOUNTER — Encounter: Payer: Self-pay | Admitting: Oncology

## 2020-03-24 VITALS — BP 128/75 | HR 99 | Temp 99.3°F | Resp 18 | Wt 188.8 lb

## 2020-03-24 DIAGNOSIS — C50211 Malignant neoplasm of upper-inner quadrant of right female breast: Secondary | ICD-10-CM

## 2020-03-24 DIAGNOSIS — Z171 Estrogen receptor negative status [ER-]: Secondary | ICD-10-CM

## 2020-03-24 DIAGNOSIS — D6481 Anemia due to antineoplastic chemotherapy: Secondary | ICD-10-CM

## 2020-03-24 DIAGNOSIS — Z95828 Presence of other vascular implants and grafts: Secondary | ICD-10-CM

## 2020-03-24 DIAGNOSIS — D4861 Neoplasm of uncertain behavior of right breast: Secondary | ICD-10-CM

## 2020-03-24 DIAGNOSIS — R7401 Elevation of levels of liver transaminase levels: Secondary | ICD-10-CM | POA: Diagnosis not present

## 2020-03-24 DIAGNOSIS — T451X5A Adverse effect of antineoplastic and immunosuppressive drugs, initial encounter: Secondary | ICD-10-CM | POA: Diagnosis not present

## 2020-03-24 DIAGNOSIS — Z5111 Encounter for antineoplastic chemotherapy: Secondary | ICD-10-CM

## 2020-03-24 LAB — CBC WITH DIFFERENTIAL/PLATELET
Abs Immature Granulocytes: 0.05 10*3/uL (ref 0.00–0.07)
Basophils Absolute: 0.1 10*3/uL (ref 0.0–0.1)
Basophils Relative: 1 %
Eosinophils Absolute: 0.1 10*3/uL (ref 0.0–0.5)
Eosinophils Relative: 1 %
HCT: 33.4 % — ABNORMAL LOW (ref 36.0–46.0)
Hemoglobin: 10.9 g/dL — ABNORMAL LOW (ref 12.0–15.0)
Immature Granulocytes: 1 %
Lymphocytes Relative: 19 %
Lymphs Abs: 1.1 10*3/uL (ref 0.7–4.0)
MCH: 31.2 pg (ref 26.0–34.0)
MCHC: 32.6 g/dL (ref 30.0–36.0)
MCV: 95.7 fL (ref 80.0–100.0)
Monocytes Absolute: 0.8 10*3/uL (ref 0.1–1.0)
Monocytes Relative: 15 %
Neutro Abs: 3.6 10*3/uL (ref 1.7–7.7)
Neutrophils Relative %: 63 %
Platelets: 267 10*3/uL (ref 150–400)
RBC: 3.49 MIL/uL — ABNORMAL LOW (ref 3.87–5.11)
RDW: 17.6 % — ABNORMAL HIGH (ref 11.5–15.5)
WBC: 5.6 10*3/uL (ref 4.0–10.5)
nRBC: 0 % (ref 0.0–0.2)

## 2020-03-24 LAB — COMPREHENSIVE METABOLIC PANEL
ALT: 47 U/L — ABNORMAL HIGH (ref 0–44)
AST: 67 U/L — ABNORMAL HIGH (ref 15–41)
Albumin: 3.9 g/dL (ref 3.5–5.0)
Alkaline Phosphatase: 79 U/L (ref 38–126)
Anion gap: 12 (ref 5–15)
BUN: 12 mg/dL (ref 8–23)
CO2: 23 mmol/L (ref 22–32)
Calcium: 8.9 mg/dL (ref 8.9–10.3)
Chloride: 100 mmol/L (ref 98–111)
Creatinine, Ser: 0.63 mg/dL (ref 0.44–1.00)
GFR, Estimated: >60 mL/min (ref >60)
Glucose, Bld: 219 mg/dL — ABNORMAL HIGH (ref 70–99)
Potassium: 3.4 mmol/L — ABNORMAL LOW (ref 3.5–5.1)
Sodium: 135 mmol/L (ref 135–145)
Total Bilirubin: 0.8 mg/dL (ref 0.3–1.2)
Total Protein: 7 g/dL (ref 6.5–8.1)

## 2020-03-24 MED ORDER — DOXORUBICIN HCL CHEMO IV INJECTION 2 MG/ML
60.0000 mg/m2 | Freq: Once | INTRAVENOUS | Status: AC
  Administered 2020-03-24: 120 mg via INTRAVENOUS
  Filled 2020-03-24: qty 50

## 2020-03-24 MED ORDER — SODIUM CHLORIDE 0.9 % IV SOLN
150.0000 mg | Freq: Once | INTRAVENOUS | Status: AC
  Administered 2020-03-24: 150 mg via INTRAVENOUS
  Filled 2020-03-24: qty 150

## 2020-03-24 MED ORDER — SODIUM CHLORIDE 0.9 % IV SOLN
600.0000 mg/m2 | Freq: Once | INTRAVENOUS | Status: AC
  Administered 2020-03-24: 1200 mg via INTRAVENOUS
  Filled 2020-03-24: qty 50

## 2020-03-24 MED ORDER — SODIUM CHLORIDE 0.9 % IV SOLN
10.0000 mg | Freq: Once | INTRAVENOUS | Status: AC
  Administered 2020-03-24: 10 mg via INTRAVENOUS
  Filled 2020-03-24: qty 10

## 2020-03-24 MED ORDER — PALONOSETRON HCL INJECTION 0.25 MG/5ML
0.2500 mg | Freq: Once | INTRAVENOUS | Status: AC
Start: 2020-03-24 — End: 2020-03-24
  Administered 2020-03-24: 0.25 mg via INTRAVENOUS
  Filled 2020-03-24: qty 5

## 2020-03-24 MED ORDER — SODIUM CHLORIDE 0.9% FLUSH
10.0000 mL | Freq: Once | INTRAVENOUS | Status: AC
  Administered 2020-03-24: 10 mL via INTRAVENOUS
  Filled 2020-03-24: qty 10

## 2020-03-24 MED ORDER — SODIUM CHLORIDE 0.9 % IV SOLN
Freq: Once | INTRAVENOUS | Status: AC
  Filled 2020-03-24: qty 250

## 2020-03-24 MED ORDER — HEPARIN SOD (PORK) LOCK FLUSH 100 UNIT/ML IV SOLN
500.0000 [IU] | Freq: Once | INTRAVENOUS | Status: AC
  Administered 2020-03-24: 500 [IU] via INTRAVENOUS
  Filled 2020-03-24: qty 5

## 2020-03-24 NOTE — Progress Notes (Signed)
Pt here for follow up and last treatment. NO new concerns voiced.

## 2020-03-24 NOTE — Progress Notes (Signed)
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 Byrnett.]   # [id number] Carbo-taxol-AC #1  # SURVIVORSHIP: Pending  # GENETICS: Pending  DIAGNOSIS: Right breast cancer triple negative  STAGE:   IIB      ;  GOALS: cure  CURRENT/MOST RECENT THERAPY : carbo-taxol-AC    Carcinoma of upper-inner quadrant of right breast in female, estrogen receptor negative ([place])  10/07/2019 Initial Diagnosis   Carcinoma of upper-inner quadrant of right breast in female, estrogen receptor negative ([place])   12/04/2019 Genetic Testing   Negative genetic testing. No pathogenic variants identified on the [facility] Common Hereditary Cancers Panel + Skin Cancers Panel. The report date is 12/04/2019.  The Common Hereditary Cancers Panel + Skin Cancers Panel offered by [facility] includes sequencing and/or deletion duplication testing of the following 54 genes: APC*, ATM*, AXIN2, BAP1, BARD1, BMPR1A, BRCA1, BRCA2, BRIP1, CDH1, CDK4, CDKN2A (p14ARF), CDKN2A (p16INK4a), CHEK2, CTNNA1, DICER1*, EPCAM*, GREM1*, HOXB13, KIT, MEN1*, MITF*, MLH1*, MSH2*, MSH3*, MSH6*, MUTYH, NBN, NF1*, NTHL1, PALB2, PDGFRA, PMS2*, POLD1*, POLE, POT1, PTCH1, PTEN*, RAD50, RAD51C, RAD51D, RB1*, RNF43, SDHA*, SDHB, SDHC*, SDHD, SMAD4, SMARCA4, STK11, SUFU, TP53, TSC1*, TSC2, VHL.       HISTORY OF PRESENTING ILLNESS:  Ashlee Cox 69 y.o.  female patient with triple negative breast cancer stage IIb neoadjuvant chemotherapy is here for follow-up for evaluation prior to chemotherapy. Patient follows up with Dr.Brahmanday who is off today, I am covering him to see this patient. Patient reports feeling well.  No new complaints.  Denies nausea vomiting, fever or chills. Some fatigue,  stable.  Appetite is fair.  She has lost 3 pounds since last visit.  Review of Systems  Constitutional: Positive for malaise/fatigue. Negative for chills, diaphoresis, fever and weight loss.  HENT: Negative for nosebleeds and sore throat.   Eyes: Negative for double vision.  Respiratory: Negative for cough, hemoptysis, sputum production and wheezing.   Cardiovascular: Negative for chest pain, palpitations, orthopnea and leg swelling.  Gastrointestinal: Negative for abdominal pain, blood in stool, constipation and melena.  Genitourinary: Negative for dysuria, frequency and urgency.  Musculoskeletal: Negative for back pain and joint pain.  Skin: Negative.  Negative for itching and rash.  Neurological: Positive for tingling. Negative for dizziness, focal weakness, weakness and headaches.  Endo/Heme/Allergies: Does not bruise/bleed easily.  Psychiatric/Behavioral: Negative for depression. The patient is not nervous/anxious and does not have insomnia.      MEDICAL HISTORY:  Past Medical History:  Diagnosis Date  . Cyst, breast    benign  . Family history of melanoma   . Family history of ovarian cancer   . GERD (gastroesophageal reflux disease)   . H/O: rheumatic fever   . 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 Hyperlipidemia   . Hypertension   . Menopause    at age 50  . Personal history of chemotherapy 09/2019   RIGHT  INVASIVE MAMMARY CARCINOMA    SURGICAL HISTORY: Past Surgical History:  Procedure Laterality Date  . APPENDECTOMY  2006  . BREAST BIOPSY Right 09/22/2019   INVASIVE MAMMARY CARCINOMA  . BREAST CYST ASPIRATION Left 1999  . CHOLECYSTECTOMY  2006  . PORTACATH PLACEMENT Left 10/06/2019   Procedure: INSERTION  PORT-A-CATH;  Surgeon: Robert Bellow, MD;  Location: [facility] ORS;  Service: General;  Laterality: Left;  . SUBMUCOSAL TATTOO INJECTION Right 10/06/2019   Procedure: Right axillary TATTOO INJECTION;   Surgeon: Robert Bellow, MD;  Location: [facility] ORS;  Service: General;  Laterality: Right;  Marland Kitchen VAGINAL DELIVERY     x3    SOCIAL HISTORY: Social History   Socioeconomic History  . Marital status: Widowed    Spouse name: Not on file  . Number of children: Not on file  . Years of education: Not on file  . Highest education level: Not on file  Occupational History  . Occupation: Glass blower/designer: [employer]  Tobacco Use  . Smoking status: Former Smoker    Years: 1.00    Types: Cigarettes    Quit date: 12/26/2005    Years since quitting: 14.2  . Smokeless tobacco: Never Used  . Tobacco comment: smoked for less than 1 years,  1 cig/day  Substance and Sexual Activity  . Alcohol use: No  . Drug use: No  . Sexual activity: Never    Partners: Female  Other Topics Concern  . Not on file  Social History Narrative   Widowed, March 2014; lives with fiance.      [employer] ret[employer]; quit smoking 1ppw; no alcohol.       Social Determinants of Health   Financial Resource Strain: Not on file  Food Insecurity: Not on file  Transportation Needs: Not on file  Physical Activity: Not on file  Stress: Not on file  Social Connections: Not on file  Intimate Partner Violence: Not on file    FAMILY HISTORY: Family History  Problem Relation Age of Onset  . Cancer Mother 85       ovarian- died 4-5 years.   . Heart disease Mother 8  . Diabetes Mother   . Stroke Father 25  . Diabetes Father   . Diabetes Sister   . Melanoma Sister        survived  . Breast cancer Neg Hx     ALLERGIES:  has No Known Allergies.  MEDICATIONS:  Current Outpatient Medications  Medication Sig Dispense Refill  . gabapentin (NEURONTIN) 100 MG capsule Take 1 capsule (100 mg total) by mouth at bedtime. 30 capsule 1  . losartan-hydrochlorothiazide (HYZAAR) 50-12.5 MG tablet Take 1 tablet by mouth daily. 90 tablet 3  . lovastatin (MEVACOR) 40 MG tablet TAKE 1 TABLET BY MOUTH AT BEDTIME 90 tablet 0  . metFORMIN  (GLUCOPHAGE) 500 MG tablet TAKE 2 TABLETS BY MOUTH TWICE DAILY WITH A MEAL 120 tablet 0  . Multiple Vitamin (MULTIVITAMIN) tablet Take 1 tablet by mouth daily.    Marland Kitchen omeprazole (PRILOSEC) 20 MG capsule Take 1 capsule by mouth once daily 90 capsule 0  . ondansetron (ZOFRAN) 8 MG tablet One pill every 8 hours as needed for nausea/vomitting. (Patient not taking: Reported on 03/24/2020) 40 tablet 1  . prochlorperazine (COMPAZINE) 10 MG tablet Take 1 tablet (10 mg total) by mouth every 6 (six) hours as needed for nausea or vomiting. (Patient not taking: Reported on 03/24/2020) 40 tablet 1   No current facility-administered medications for this visit.      Marland Kitchen  PHYSICAL EXAMINATION: ECOG PERFORMANCE STATUS: 0 - Asymptomatic  Vitals:   03/24/20 0918  BP: 128/75  Pulse: 99  Resp: 18  Temp: 99.3 F (37.4 C)   Filed Weights   03/24/20 0918  Weight: 188 lb 12.8 oz (85.6 kg)  Physical Exam HENT:     Head: Normocephalic and atraumatic.     Mouth/Throat:     Pharynx: No oropharyngeal exudate.  Eyes:     Pupils: Pupils are equal, round, and reactive to light.  Cardiovascular:     Rate and Rhythm: Normal rate and regular rhythm.  Pulmonary:     Effort: No respiratory distress.     Breath sounds: No wheezing.  Abdominal:     General: Bowel sounds are normal. There is no distension.     Palpations: Abdomen is soft. There is no mass.     Tenderness: There is no abdominal tenderness. There is no guarding or rebound.  Musculoskeletal:        General: No tenderness. Normal range of motion.     Cervical back: Normal range of motion and neck supple.  Skin:    General: Skin is warm.     Comments:    Neurological:     Mental Status: She is alert and oriented to person, place, and time.  Psychiatric:        Mood and Affect: Affect normal.   Deferred breast examination as Dr. Rogue Bussing did breast examination during last visit.   LABORATORY DATA:  I have reviewed the data as  listed Lab Results  Component Value Date   WBC 5.6 03/24/2020   HGB 10.9 (L) 03/24/2020   HCT 33.4 (L) 03/24/2020   MCV 95.7 03/24/2020   PLT 267 03/24/2020   Recent Labs    12/02/19 0922 12/09/19 0844 12/23/19 0846 12/30/19 0839 01/21/20 0842 02/11/20 0933 03/03/20 0911 03/24/20 0859  NA 144 140 140   < > 137 138 139 135  K 3.4* 3.3* 3.3*   < > 3.6 3.5 3.2* 3.4*  CL 106 104 104   < > 104 102 100 100  CO2 _0 < > 21* _1 GLUCOSE 194* 204* 237*   < > 175* 226* 184* 219*  BUN _2 < > _3 CREATININE 0.68 0.79 0.77   < > 0.77 0.67 0.89 0.63  CALCIUM 9.1 8.9 9.1   < > 8.8* 9.3 8.8* 8.9  GFRNONAA >60 >60 >60   < > >60 >60 >60 >60  GFRAA >60 >60 >60  --   --   --   --   --   PROT  --   --  6.7   < > 6.5  --  6.6 7.0  ALBUMIN  --   --  3.8   < > 3.8  --  3.9 3.9  AST  --   --  56*   < > 61*  --  58* 67*  ALT  --   --  43   < > 47*  --  46* 47*  ALKPHOS  --   --  66   < > 67  --  72 79  BILITOT  --   --  0.6   < > 0.8  --  0.6 0.8   < > = values in this interval not displayed.    RADIOGRAPHIC STUDIES: I have personally reviewed the radiological images as listed and agreed with the findings in the report. No results found.  ASSESSMENT & PLAN:  1. Carcinoma of upper-inner quadrant of right breast in female, estrogen receptor negative ([place])   2. Encounter for antineoplastic chemotherapy   3. Port-A-Cath in place   4. Anemia due to antineoplastic  chemotherapy   5. Transaminitis    #Right breast cancer, triple negative, stage IIb, T2N1 Currently on neoadjuvant chemotherapy treatments. Interval ultrasound and mammogram showed partial response. Labs are reviewed and discussed with patient Counts acceptable to proceed with [facility] today.-She receives Fulphilia on Day 2 She will be finishing on neoadjuvant chemotherapy after today's treatments. Plan unilateral right diagnostic mammogram 2 weeks after treatments. Follow-up with Dr. Bary Castilla for discussion  of surgery.-cc. Dr.Byrnett.   #Grade 1 transaminitis, likely secondary to chemotherapy.  Continue to monitor. #Chemotherapy-induced anemia, hemoglobin stable at 10.9.  Monitor. #Diabetes, continue Metformin diabetic diet. #Port-A-Cath in place, discussed about port flush every 6 to 8 weeks.  Dr. Rogue Bussing to schedule in the future. All questions were answered. The patient/family knows to call the clinic with any problems, questions or concerns. Follow-up to be determined.  Patient will need to follow-up with Dr. Rogue Bussing after her breast surgery.   Earlie Server, MD 03/24/2020 3:26 PM

## 2020-03-24 NOTE — Progress Notes (Signed)
Tolerated AC well. Discharged home in stable condition.

## 2020-03-25 ENCOUNTER — Other Ambulatory Visit: Payer: Self-pay

## 2020-03-25 ENCOUNTER — Inpatient Hospital Stay: Payer: PPO

## 2020-03-25 DIAGNOSIS — Z5111 Encounter for antineoplastic chemotherapy: Secondary | ICD-10-CM | POA: Diagnosis not present

## 2020-03-25 DIAGNOSIS — D4861 Neoplasm of uncertain behavior of right breast: Secondary | ICD-10-CM

## 2020-03-25 MED ORDER — PEGFILGRASTIM-JMDB 6 MG/0.6ML ~~LOC~~ SOSY
6.0000 mg | PREFILLED_SYRINGE | Freq: Once | SUBCUTANEOUS | Status: AC
  Administered 2020-03-25: 6 mg via SUBCUTANEOUS
  Filled 2020-03-25: qty 0.6

## 2020-03-28 ENCOUNTER — Other Ambulatory Visit: Payer: Self-pay | Admitting: Internal Medicine

## 2020-04-07 ENCOUNTER — Telehealth: Payer: Self-pay | Admitting: *Deleted

## 2020-04-07 NOTE — Telephone Encounter (Signed)
I contacted patient. Mammogram treatment check scheduled for 1/24/222 at 3pm.  Follow-up apt scheduled 04/28/2020 at 10:15 with Dr. Rogue Bussing.

## 2020-04-13 ENCOUNTER — Ambulatory Visit: Payer: PPO | Admitting: Internal Medicine

## 2020-04-15 ENCOUNTER — Other Ambulatory Visit: Payer: Self-pay | Admitting: General Surgery

## 2020-04-15 DIAGNOSIS — Z171 Estrogen receptor negative status [ER-]: Secondary | ICD-10-CM | POA: Diagnosis not present

## 2020-04-15 DIAGNOSIS — C50411 Malignant neoplasm of upper-outer quadrant of right female breast: Secondary | ICD-10-CM

## 2020-04-15 DIAGNOSIS — C50211 Malignant neoplasm of upper-inner quadrant of right female breast: Secondary | ICD-10-CM | POA: Diagnosis not present

## 2020-04-15 NOTE — Progress Notes (Signed)
Subjective:     Patient ID: Ashlee Cox is a 70 y.o. female.  HPI  The following portions of the patient's history were reviewed and updated as appropriate.  This an established patient is here today for: office visit. The patient is here today to discuss surgery. She has completed neo-adjuvant chemotherapy.  The patient is accompanied by her husband, Ashlee Cox.       Chief Complaint  Patient presents with  . Follow-up    to discuss surgery, has completed neo-adjuvant chemotherapy     BP (!) 162/80   Pulse 99   Temp 36.3 C (97.4 F)   Ht 170.2 cm (5\' 7" )   Wt 85.3 kg (188 lb)   SpO2 97%   BMI 29.44 kg/m   Past Medical History:  Diagnosis Date  . Breast cancer (CMS-HCC) 2021   right  . Breast cyst   . Genetic testing 2021  . GERD (gastroesophageal reflux disease)   . History of rheumatic fever   . Hyperlipidemia   . Hypertension   . Personal history of chemotherapy 09/2019          Past Surgical History:  Procedure Laterality Date  . APPENDECTOMY  2006  . beast cyst aspiration Left 1999  . BREAST EXCISIONAL BIOPSY Right 09/22/2019  . CHOLECYSTECTOMY  2006  . COLONOSCOPY  2010   Dr. Dionne Milo  . port a cath placement Left 10/06/2019  . submucosal tattoo injection Right 10/06/2019   right axillary              OB History    Gravida  3   Para  3   Term      Preterm      AB      Living        SAB      IAB      Ectopic      Molar      Multiple      Live Births          Obstetric Comments  Age at first period 73 Age of first pregnancy 77         Social History          Socioeconomic History  . Marital status: Widowed    Spouse name: Not on file  . Number of children: Not on file  . Years of education: Not on file  . Highest education level: Not on file  Occupational History  . Not on file  Tobacco Use  . Smoking status: Former Smoker    Years: 1.00    Types: Cigarettes     Quit date: 12/26/2005    Years since quitting: 14.3  . Smokeless tobacco: Never Used  . Tobacco comment: smoked for less than 1 year, 1 cig/day  Substance and Sexual Activity  . Alcohol use: Never  . Drug use: Never  . Sexual activity: Not on file  Other Topics Concern  . Not on file  Social History Narrative  . Not on file   Social Determinants of Health   Financial Resource Strain: Not on file  Food Insecurity: Not on file  Transportation Needs: Not on file       No Known Allergies  Current Medications        Current Outpatient Medications  Medication Sig Dispense Refill  . amLODIPine (NORVASC) 5 MG tablet amlodipine 5 mg tablet    . gabapentin (NEURONTIN) 100 MG capsule Take 1 capsule by mouth nightly    .  losartan-hydrochlorothiazide (HYZAAR) 50-12.5 mg tablet Take 1 tablet by mouth once daily    . lovastatin (MEVACOR) 40 MG tablet lovastatin 40 mg tablet    . metFORMIN (GLUCOPHAGE) 500 MG tablet TAKE 2 TABLETS BY MOUTH TWICE DAILY WITH A MEAL    . multivitamin (MULTIVITAMIN) tablet Take 1 tablet by mouth once daily    . omeprazole (PRILOSEC) 20 MG DR capsule omeprazole 20 mg capsule,delayed release     No current facility-administered medications for this visit.           Family History  Problem Relation Age of Onset  . Diabetes Mother   . Ovarian cancer Mother   . Heart disease Mother   . Diabetes Father   . Stroke Father   . Diabetes Sister   . Melanoma Sister   . Breast cancer Neg Hx   . Colon cancer Neg Hx       Review of Systems  Constitutional: Negative for chills and fever.  Respiratory: Negative for cough.        Objective:   Physical Exam Exam conducted with a chaperone present.  Constitutional:      Appearance: Normal appearance.  Cardiovascular:     Rate and Rhythm: Normal rate and regular rhythm.     Pulses: Normal pulses.     Heart sounds: Normal heart sounds.  Pulmonary:     Effort:  Pulmonary effort is normal.     Breath sounds: Normal breath sounds.  Chest:  Breasts:     Right: No axillary adenopathy or supraclavicular adenopathy.     Left: Normal. No axillary adenopathy or supraclavicular adenopathy.   Previously palpable mass in the upper outer quadrant is no longer evident.  Minimal fullness.  No palpable axillary adenopathy.   Musculoskeletal:     Cervical back: Neck supple.  Lymphadenopathy:     Upper Body:     Right upper body: No supraclavicular or axillary adenopathy.     Left upper body: No supraclavicular or axillary adenopathy.  Skin:    General: Skin is warm and dry.  Neurological:     Mental Status: She is alert and oriented to person, place, and time.  Psychiatric:        Mood and Affect: Mood normal.        Behavior: Behavior normal.    Labs and Radiology:   Ultrasound examination was undertaken to determine if preoperative wire localization of the breast primary would be required.  Examined in the upper outer quadrant showed no distinct residual mass.  The biopsy clip was not evident.  December 19, 2019 mammogram and ultrasound:  Significant decrease in size of the lesion at the 1030 o'clock position.  Resolution of enlargement of the previously biopsied axillary node.  The patient had been scheduled for mammogram and ultrasound on April 19, 2020.  Based on today's exam and ultrasound, and in discussion with her medical oncologist, this study will be canceled (by medical oncology).    Assessment:     Excellent clinical response to neoadjuvant chemotherapy.    Plan:       The patient would like to proceed with breast conservation.  We will complete a sentinel node biopsy at that time.  She had previously undergone tattoo of the right axillary node.  Efforts will be made to retrieve this note to confirm response.  Wire localization will be required for the breast primary.  Reviewed that there is no survival benefit to  mastectomy versus breast conservation.  Role  of post surgery radiation therapy with breast conservation was reviewed.  Surgery to be scheduled for [facility]-20 at [facility]. The patient already has EMLA cream from chemotherapy and she has been instructed on how to use this the day of the surgery.     Entered by Ledell Noss, CMA, acting as a scribe for Dr. Hervey Ard, MD.   The documentation recorded by the scribe accurately reflects the service I personally performed and the decisions made by me.   Robert Bellow, MD FACS

## 2020-04-16 ENCOUNTER — Other Ambulatory Visit: Payer: Self-pay | Admitting: Internal Medicine

## 2020-04-19 ENCOUNTER — Other Ambulatory Visit: Payer: PPO

## 2020-04-20 ENCOUNTER — Encounter: Payer: Self-pay | Admitting: *Deleted

## 2020-04-21 ENCOUNTER — Other Ambulatory Visit: Payer: Self-pay

## 2020-04-21 ENCOUNTER — Encounter: Payer: Self-pay | Admitting: *Deleted

## 2020-04-21 ENCOUNTER — Other Ambulatory Visit
Admission: RE | Admit: 2020-04-21 | Discharge: 2020-04-21 | Disposition: A | Discharge status: Home / Self Care | Payer: PPO | Source: Ambulatory Visit | Attending: General Surgery | Admitting: General Surgery

## 2020-04-21 ENCOUNTER — Encounter
Admission: RE | Admit: 2020-04-21 | Discharge: 2020-04-21 | Disposition: A | Discharge status: Home / Self Care | Payer: PPO | Source: Ambulatory Visit | Attending: General Surgery | Admitting: General Surgery

## 2020-04-21 DIAGNOSIS — I10 Essential (primary) hypertension: Secondary | ICD-10-CM | POA: Insufficient documentation

## 2020-04-21 DIAGNOSIS — Z20822 Contact with and (suspected) exposure to covid-19: Secondary | ICD-10-CM | POA: Insufficient documentation

## 2020-04-21 DIAGNOSIS — Z01818 Encounter for other preprocedural examination: Secondary | ICD-10-CM | POA: Insufficient documentation

## 2020-04-21 DIAGNOSIS — E118 Type 2 diabetes mellitus with unspecified complications: Secondary | ICD-10-CM | POA: Diagnosis not present

## 2020-04-21 HISTORY — DX: Type 2 diabetes mellitus without complications: E11.9

## 2020-04-21 LAB — BASIC METABOLIC PANEL
Anion gap: 14 (ref 5–15)
BUN: 13 mg/dL (ref 8–23)
CO2: 22 mmol/L (ref 22–32)
Calcium: 9.9 mg/dL (ref 8.9–10.3)
Chloride: 104 mmol/L (ref 98–111)
Creatinine, Ser: 0.69 mg/dL (ref 0.44–1.00)
GFR, Estimated: >60 mL/min (ref >60)
Glucose, Bld: 168 mg/dL — ABNORMAL HIGH (ref 70–99)
Potassium: 3.5 mmol/L (ref 3.5–5.1)
Sodium: 140 mmol/L (ref 135–145)

## 2020-04-21 NOTE — Patient Instructions (Addendum)
Your procedure is scheduled on:  Friday, January 28 Report to the Registration Desk on the 1st floor of the [facility]. To find out your arrival time, please call [phone] between 1PM - 3PM on: Thursday, January 27  REMEMBER: Instructions that are not followed completely may result in serious medical risk, up to and including death; or upon the discretion of your surgeon and anesthesiologist your surgery may need to be rescheduled.  Do not eat food after midnight the night before surgery.  No gum chewing, lozengers or hard candies.  You may however, drink water up to 2 hours before you are scheduled to arrive for your surgery. Do not drink anything within 2 hours of your scheduled arrival time.  TAKE THESE MEDICATIONS THE MORNING OF SURGERY WITH A SIP OF WATER:  1.  Tylenol if needed for pain 2.  Omeprazole - (take one the night before and one on the morning of surgery - helps to prevent nausea after surgery.)  Stop Metformin 2 days prior to surgery. Last day to take is Tuesday, January 25  One week prior to surgery: Stop Anti-inflammatories (NSAIDS) such as Advil, Aleve, Ibuprofen, Motrin, Naproxen, Naprosyn and Aspirin based products such as Excedrin, Goodys Powder, BC Powder. Stop ANY OVER THE COUNTER supplements until after surgery. (However, you may continue taking and multivitamin up until the day before surgery.)  No Alcohol for 24 hours before or after surgery.  No Smoking including e-cigarettes for 24 hours prior to surgery.  No chewable tobacco products for at least 6 hours prior to surgery.  No nicotine patches on the day of surgery.  Do not use any "recreational" drugs for at least a week prior to your surgery.  Please be advised that the combination of cocaine and anesthesia may have negative outcomes, up to and including death. If you test positive for cocaine, your surgery will be cancelled.  On the morning of surgery brush your teeth with toothpaste and  water, you may rinse your mouth with mouthwash if you wish. Do not swallow any toothpaste or mouthwash.  Do not wear jewelry, make-up, hairpins, clips or nail polish.  Do not wear lotions, powders, or perfumes.   Do not shave body from the neck down 48 hours prior to surgery just in case you cut yourself which could leave a site for infection.  Also, freshly shaved skin may become irritated if using the CHG soap.  Contact lenses, hearing aids and dentures may not be worn into surgery.  Do not bring valuables to the hospital. [facility] is not responsible for any missing/lost belongings or valuables.   Use CHG Soap as directed on instruction sheet.  Notify your doctor if there is any change in your medical condition (cold, fever, infection).  Wear comfortable clothing (specific to your surgery type) to the hospital.  Plan for stool softeners for home use; pain medications have a tendency to cause constipation. You can also help prevent constipation by eating foods high in fiber such as fruits and vegetables and drinking plenty of fluids as your diet allows.  After surgery, you can help prevent lung complications by doing breathing exercises.  Take deep breaths and cough every 1-2 hours. Your doctor may order a device called an Incentive Spirometer to help you take deep breaths.  If you are being discharged the day of surgery, you will not be allowed to drive home. You will need a responsible adult (18 years or older) to drive you home and stay  with you that night.   If you are taking public transportation, you will need to have a responsible adult (18 years or older) with you. Please confirm with your physician that it is acceptable to use public transportation.   Please call the [facility] at [phone] if you have any questions about these instructions.  Visitation Policy:  Patients undergoing a surgery or procedure may have one family member or support  person with them as long as that person is not COVID-19 positive or experiencing its symptoms.  That person may remain in the waiting area during the procedure.

## 2020-04-22 ENCOUNTER — Other Ambulatory Visit: Admission: RE | Admit: 2020-04-22 | Payer: PPO | Source: Ambulatory Visit

## 2020-04-22 LAB — SARS CORONAVIRUS 2 (TAT 6-24 HRS): SARS Coronavirus 2: NEGATIVE

## 2020-04-23 ENCOUNTER — Ambulatory Visit
Admission: RE | Admit: 2020-04-23 | Discharge: 2020-04-23 | Disposition: A | Discharge status: Home / Self Care | Payer: PPO | Source: Ambulatory Visit | Attending: General Surgery | Admitting: General Surgery

## 2020-04-23 ENCOUNTER — Ambulatory Visit
Admission: RE | Admit: 2020-04-23 | Discharge: 2020-04-23 | Disposition: A | Discharge status: Home / Self Care | Payer: PPO | Attending: General Surgery | Admitting: General Surgery

## 2020-04-23 ENCOUNTER — Ambulatory Visit: Payer: PPO | Admitting: Urgent Care

## 2020-04-23 ENCOUNTER — Encounter
Admission: RE | Disposition: A | Discharge status: Home / Self Care | Payer: Self-pay | Source: Home / Self Care | Attending: General Surgery

## 2020-04-23 ENCOUNTER — Other Ambulatory Visit: Payer: Self-pay

## 2020-04-23 DIAGNOSIS — Z7984 Long term (current) use of oral hypoglycemic drugs: Secondary | ICD-10-CM | POA: Insufficient documentation

## 2020-04-23 DIAGNOSIS — Z87891 Personal history of nicotine dependence: Secondary | ICD-10-CM | POA: Insufficient documentation

## 2020-04-23 DIAGNOSIS — C50411 Malignant neoplasm of upper-outer quadrant of right female breast: Secondary | ICD-10-CM

## 2020-04-23 DIAGNOSIS — N6311 Unspecified lump in the right breast, upper outer quadrant: Secondary | ICD-10-CM | POA: Diagnosis not present

## 2020-04-23 DIAGNOSIS — C50911 Malignant neoplasm of unspecified site of right female breast: Secondary | ICD-10-CM | POA: Diagnosis not present

## 2020-04-23 DIAGNOSIS — C773 Secondary and unspecified malignant neoplasm of axilla and upper limb lymph nodes: Secondary | ICD-10-CM | POA: Diagnosis not present

## 2020-04-23 DIAGNOSIS — Z9221 Personal history of antineoplastic chemotherapy: Secondary | ICD-10-CM | POA: Diagnosis not present

## 2020-04-23 DIAGNOSIS — Z79899 Other long term (current) drug therapy: Secondary | ICD-10-CM | POA: Insufficient documentation

## 2020-04-23 DIAGNOSIS — Z419 Encounter for procedure for purposes other than remedying health state, unspecified: Secondary | ICD-10-CM

## 2020-04-23 HISTORY — PX: BREAST LUMPECTOMY WITH NEEDLE LOCALIZATION: SHX5759

## 2020-04-23 HISTORY — PX: BREAST LUMPECTOMY: SHX2

## 2020-04-23 HISTORY — PX: BREAST LUMPECTOMY WITH SENTINEL LYMPH NODE BIOPSY: SHX5597

## 2020-04-23 LAB — GLUCOSE, CAPILLARY
Glucose-Capillary: 185 mg/dL — ABNORMAL HIGH (ref 70–99)
Glucose-Capillary: 179 mg/dL — ABNORMAL HIGH (ref 70–99)

## 2020-04-23 SURGERY — BREAST LUMPECTOMY WITH SENTINEL LYMPH NODE BX
Anesthesia: General | Laterality: Right

## 2020-04-23 MED ORDER — MIDAZOLAM HCL 2 MG/2ML IJ SOLN
INTRAMUSCULAR | Status: AC
  Filled 2020-04-23: qty 2

## 2020-04-23 MED ORDER — SODIUM CHLORIDE 0.9 % IV SOLN: INTRAVENOUS | Status: DC

## 2020-04-23 MED ORDER — EPHEDRINE 5 MG/ML INJ
INTRAVENOUS | Status: AC
  Filled 2020-04-23: qty 10

## 2020-04-23 MED ORDER — FENTANYL CITRATE (PF) 100 MCG/2ML IJ SOLN
INTRAMUSCULAR | Status: AC
  Filled 2020-04-23: qty 2

## 2020-04-23 MED ORDER — ONDANSETRON HCL 4 MG/2ML IJ SOLN
INTRAMUSCULAR | Status: AC
  Filled 2020-04-23: qty 2

## 2020-04-23 MED ORDER — PHENYLEPHRINE HCL (PRESSORS) 10 MG/ML IV SOLN
INTRAVENOUS | Status: DC | PRN
  Administered 2020-04-23 (×4): 100 ug via INTRAVENOUS

## 2020-04-23 MED ORDER — HYDROCODONE-ACETAMINOPHEN 5-325 MG PO TABS: 1.0000 | ORAL_TABLET | ORAL | 0 refills | Status: DC | PRN

## 2020-04-23 MED ORDER — ORAL CARE MOUTH RINSE: 15.0000 mL | Freq: Once | OROMUCOSAL | Status: AC

## 2020-04-23 MED ORDER — LIDOCAINE HCL (PF) 2 % IJ SOLN
INTRAMUSCULAR | Status: AC
  Filled 2020-04-23: qty 5

## 2020-04-23 MED ORDER — OXYCODONE HCL 5 MG PO TABS
5.0000 mg | ORAL_TABLET | Freq: Once | ORAL | Status: DC | PRN
Start: 2020-04-23 — End: 2020-04-23

## 2020-04-23 MED ORDER — FENTANYL CITRATE (PF) 100 MCG/2ML IJ SOLN
INTRAMUSCULAR | Status: DC | PRN
  Administered 2020-04-23 (×2): 50 ug via INTRAVENOUS

## 2020-04-23 MED ORDER — ONDANSETRON HCL 4 MG/2ML IJ SOLN: 4.0000 mg | Freq: Once | INTRAMUSCULAR | Status: DC | PRN

## 2020-04-23 MED ORDER — SUCCINYLCHOLINE CHLORIDE 200 MG/10ML IV SOSY
PREFILLED_SYRINGE | INTRAVENOUS | Status: AC
  Filled 2020-04-23: qty 10

## 2020-04-23 MED ORDER — EPINEPHRINE PF 1 MG/ML IJ SOLN
INTRAMUSCULAR | Status: AC
  Filled 2020-04-23: qty 1

## 2020-04-23 MED ORDER — BUPIVACAINE-EPINEPHRINE (PF) 0.5% -1:200000 IJ SOLN
INTRAMUSCULAR | Status: DC | PRN
  Administered 2020-04-23: 20 mL

## 2020-04-23 MED ORDER — LACTATED RINGERS IV SOLN: INTRAVENOUS | Status: DC | PRN

## 2020-04-23 MED ORDER — PROPOFOL 10 MG/ML IV BOLUS
INTRAVENOUS | Status: DC | PRN
  Administered 2020-04-23: 130 mg via INTRAVENOUS
  Administered 2020-04-23: 40 mg via INTRAVENOUS
  Administered 2020-04-23: 50 mg via INTRAVENOUS
  Administered 2020-04-23: 40 mg via INTRAVENOUS

## 2020-04-23 MED ORDER — MIDAZOLAM HCL 2 MG/2ML IJ SOLN
INTRAMUSCULAR | Status: DC | PRN
  Administered 2020-04-23: 2 mg via INTRAVENOUS

## 2020-04-23 MED ORDER — ACETAMINOPHEN 10 MG/ML IV SOLN
INTRAVENOUS | Status: AC
  Filled 2020-04-23: qty 100

## 2020-04-23 MED ORDER — ONDANSETRON HCL 4 MG/2ML IJ SOLN
INTRAMUSCULAR | Status: DC | PRN
  Administered 2020-04-23: 4 mg via INTRAVENOUS

## 2020-04-23 MED ORDER — FENTANYL CITRATE (PF) 100 MCG/2ML IJ SOLN
25.0000 ug | INTRAMUSCULAR | Status: DC | PRN
  Administered 2020-04-23: 25 ug via INTRAVENOUS

## 2020-04-23 MED ORDER — LIDOCAINE HCL (CARDIAC) PF 100 MG/5ML IV SOSY
PREFILLED_SYRINGE | INTRAVENOUS | Status: DC | PRN
  Administered 2020-04-23: 100 mg via INTRAVENOUS

## 2020-04-23 MED ORDER — BUPIVACAINE HCL (PF) 0.5 % IJ SOLN
INTRAMUSCULAR | Status: AC
  Filled 2020-04-23: qty 30

## 2020-04-23 MED ORDER — SODIUM CHLORIDE 0.9 % IV SOLN
INTRAVENOUS | Status: DC | PRN
  Administered 2020-04-23: 50 ug/min via INTRAVENOUS

## 2020-04-23 MED ORDER — CHLORHEXIDINE GLUCONATE 0.12 % MT SOLN
15.0000 mL | Freq: Once | OROMUCOSAL | Status: AC
  Administered 2020-04-23: 15 mL via OROMUCOSAL

## 2020-04-23 MED ORDER — METHYLENE BLUE 0.5 % INJ SOLN
INTRAVENOUS | Status: AC
  Filled 2020-04-23: qty 10

## 2020-04-23 MED ORDER — SUCCINYLCHOLINE CHLORIDE 20 MG/ML IJ SOLN
INTRAMUSCULAR | Status: DC | PRN
  Administered 2020-04-23: 100 mg via INTRAVENOUS

## 2020-04-23 MED ORDER — OXYCODONE HCL 5 MG/5ML PO SOLN: 5.0000 mg | Freq: Once | ORAL | Status: DC | PRN

## 2020-04-23 MED ORDER — CHLORHEXIDINE GLUCONATE CLOTH 2 % EX PADS: 6.0000 | MEDICATED_PAD | Freq: Once | CUTANEOUS | Status: DC

## 2020-04-23 MED ORDER — TECHNETIUM TC 99M TILMANOCEPT KIT
1.1300 | PACK | Freq: Once | INTRAVENOUS | Status: AC | PRN
  Administered 2020-04-23: 1.13 via INTRADERMAL

## 2020-04-23 MED ORDER — ACETAMINOPHEN 10 MG/ML IV SOLN
INTRAVENOUS | Status: DC | PRN
  Administered 2020-04-23: 1000 mg via INTRAVENOUS

## 2020-04-23 MED ORDER — PROPOFOL 10 MG/ML IV BOLUS
INTRAVENOUS | Status: AC
  Filled 2020-04-23: qty 20

## 2020-04-23 MED ORDER — EPHEDRINE SULFATE 50 MG/ML IJ SOLN
INTRAMUSCULAR | Status: DC | PRN
  Administered 2020-04-23: 10 mg via INTRAVENOUS

## 2020-04-23 MED ORDER — DEXAMETHASONE SODIUM PHOSPHATE 10 MG/ML IJ SOLN
INTRAMUSCULAR | Status: DC | PRN
  Administered 2020-04-23: 6 mg via INTRAVENOUS

## 2020-04-23 MED ORDER — PHENYLEPHRINE HCL (PRESSORS) 10 MG/ML IV SOLN
INTRAVENOUS | Status: AC
  Filled 2020-04-23: qty 1

## 2020-04-23 MED ORDER — KETOROLAC TROMETHAMINE 30 MG/ML IJ SOLN
INTRAMUSCULAR | Status: DC | PRN
  Administered 2020-04-23: 30 mg via INTRAVENOUS

## 2020-04-23 MED ORDER — CHLORHEXIDINE GLUCONATE 0.12 % MT SOLN
OROMUCOSAL | Status: AC
  Filled 2020-04-23: qty 15

## 2020-04-23 SURGICAL SUPPLY — 59 items
APL PRP STRL LF DISP 70% ISPRP (MISCELLANEOUS) ×1
BINDER BREAST LRG (GAUZE/BANDAGES/DRESSINGS) IMPLANT
BINDER BREAST MEDIUM (GAUZE/BANDAGES/DRESSINGS) IMPLANT
BINDER BREAST XLRG (GAUZE/BANDAGES/DRESSINGS) IMPLANT
BINDER BREAST XXLRG (GAUZE/BANDAGES/DRESSINGS) IMPLANT
BLADE BOVIE TIP EXT 4 (BLADE) IMPLANT
BLADE SURG 15 STRL SS SAFETY (BLADE) ×4 IMPLANT
BULB RESERV EVAC DRAIN JP 100C (MISCELLANEOUS) IMPLANT
CANISTER SUCT 1200ML W/VALVE (MISCELLANEOUS) ×2 IMPLANT
CHLORAPREP W/TINT 26 (MISCELLANEOUS) ×2 IMPLANT
CNTNR SPEC 2.5X3XGRAD LEK (MISCELLANEOUS)
CONT SPEC 4OZ STER OR WHT (MISCELLANEOUS)
CONT SPEC 4OZ STRL OR WHT (MISCELLANEOUS)
CONTAINER SPEC 2.5X3XGRAD LEK (MISCELLANEOUS) IMPLANT
COVER PROBE FLX POLY STRL (MISCELLANEOUS) ×2 IMPLANT
COVER WAND RF STERILE (DRAPES) ×2 IMPLANT
DEVICE DUBIN SPECIMEN MAMMOGRA (MISCELLANEOUS) ×2 IMPLANT
DRAIN CHANNEL JP 15F RND 16 (MISCELLANEOUS) IMPLANT
DRAPE LAPAROTOMY TRNSV 106X77 (MISCELLANEOUS) ×2 IMPLANT
DRSG GAUZE FLUFF 36X18 (GAUZE/BANDAGES/DRESSINGS) ×4 IMPLANT
DRSG TELFA 3X8 NADH (GAUZE/BANDAGES/DRESSINGS) ×2 IMPLANT
ELECT CAUTERY BLADE TIP 2.5 (TIP) ×2
ELECT REM PT RETURN 9FT ADLT (ELECTROSURGICAL) ×2
ELECTRODE CAUTERY BLDE TIP 2.5 (TIP) ×1 IMPLANT
ELECTRODE REM PT RTRN 9FT ADLT (ELECTROSURGICAL) ×1 IMPLANT
GLOVE BIO SURGEON STRL SZ7.5 (GLOVE) ×2 IMPLANT
GLOVE INDICATOR 8.0 STRL GRN (GLOVE) ×2 IMPLANT
GOWN STRL REUS W/ TWL LRG LVL3 (GOWN DISPOSABLE) ×2 IMPLANT
GOWN STRL REUS W/TWL LRG LVL3 (GOWN DISPOSABLE) ×4
KIT TURNOVER KIT A (KITS) ×2 IMPLANT
LABEL OR SOLS (LABEL) ×2 IMPLANT
MANIFOLD NEPTUNE II (INSTRUMENTS) ×2 IMPLANT
MARGIN MAP 10MM (MISCELLANEOUS) ×2 IMPLANT
NDL HYPO 25X1 1.5 SAFETY (NEEDLE) ×2 IMPLANT
NEEDLE HYPO 22GX1.5 SAFETY (NEEDLE) ×2 IMPLANT
NEEDLE HYPO 25X1 1.5 SAFETY (NEEDLE) ×4 IMPLANT
PACK BASIN MINOR ARMC (MISCELLANEOUS) ×2 IMPLANT
PAD DRESSING TELFA 3X8 NADH (GAUZE/BANDAGES/DRESSINGS) ×1 IMPLANT
RETRACTOR RING XSMALL (MISCELLANEOUS) IMPLANT
RTRCTR WOUND ALEXIS 13CM XS SH (MISCELLANEOUS)
SHEARS FOC LG CVD HARMONIC 17C (MISCELLANEOUS) IMPLANT
SHEARS HARMONIC 9CM CVD (BLADE) IMPLANT
SLEVE PROBE SENORX GAMMA FIND (MISCELLANEOUS) IMPLANT
STRIP CLOSURE SKIN 1/2X4 (GAUZE/BANDAGES/DRESSINGS) ×2 IMPLANT
SUT ETHILON 3-0 FS-10 30 BLK (SUTURE) ×2
SUT SILK 2 0 (SUTURE) ×2
SUT SILK 2-0 18XBRD TIE 12 (SUTURE) ×1 IMPLANT
SUT VIC AB 2-0 CT1 27 (SUTURE) ×4
SUT VIC AB 2-0 CT1 TAPERPNT 27 (SUTURE) ×2 IMPLANT
SUT VIC AB 3-0 SH 27 (SUTURE) ×4
SUT VIC AB 3-0 SH 27X BRD (SUTURE) ×2 IMPLANT
SUT VIC AB 4-0 FS2 27 (SUTURE) ×4 IMPLANT
SUT VICRYL+ 3-0 144IN (SUTURE) ×2 IMPLANT
SUTURE EHLN 3-0 FS-10 30 BLK (SUTURE) ×1 IMPLANT
SWABSTK COMLB BENZOIN TINCTURE (MISCELLANEOUS) ×2 IMPLANT
SYR 10ML LL (SYRINGE) ×2 IMPLANT
SYR BULB IRRIG 60ML STRL (SYRINGE) ×2 IMPLANT
TAPE TRANSPORE STRL 2 31045 (GAUZE/BANDAGES/DRESSINGS) ×2 IMPLANT
WATER STERILE IRR 1000ML POUR (IV SOLUTION) ×2 IMPLANT

## 2020-04-23 NOTE — H&P (Signed)
Ashlee Cox [id number] 1950/04/05     HPI:  70 y/o woman s/p neo-adjuvant chemotherapy with an excellent clinical and radiological response.  For wide excision and SLN biopsy.  Tolerated wire localization and SLN injection well.   Medications Prior to Admission  Medication Sig Dispense Refill Last Dose  . acetaminophen (TYLENOL) 500 MG tablet Take 500 mg by mouth every 8 (eight) hours as needed for moderate pain.   04/22/2020 at Unknown time  . gabapentin (NEURONTIN) 100 MG capsule Take 1 capsule by mouth at bedtime (Patient taking differently: Take 100 mg by mouth at bedtime.) 30 capsule 0 04/22/2020 at Unknown time  . losartan-hydrochlorothiazide (HYZAAR) 50-12.5 MG tablet Take 1 tablet by mouth daily. 90 tablet 3 04/22/2020 at Unknown time  . lovastatin (MEVACOR) 40 MG tablet TAKE 1 TABLET BY MOUTH AT BEDTIME (Patient taking differently: Take 40 mg by mouth at bedtime.) 90 tablet 0 04/22/2020 at Unknown time  . metFORMIN (GLUCOPHAGE) 500 MG tablet TAKE 2 TABLETS BY MOUTH TWICE DAILY WITH A MEAL 120 tablet 0 04/22/2020 at Unknown time  . Multiple Vitamin (MULTIVITAMIN) tablet Take 1 tablet by mouth daily.   04/22/2020 at Unknown time  . omeprazole (PRILOSEC) 20 MG capsule Take 1 capsule by mouth once daily 90 capsule 0 04/23/2020 at Unknown time   No Known Allergies Past Medical History:  Diagnosis Date  . Cancer [facility]) 2021   right breast  . Cyst, breast    benign  . Diabetes mellitus without complication ([place])   . Family history of melanoma   . Family history of ovarian cancer   . GERD (gastroesophageal reflux disease)   . H/O: rheumatic fever   . 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 Hyperlipidemia   . Hypertension   . Menopause    at age 48  . Personal history of chemotherapy 09/2019   RIGHT  INVASIVE MAMMARY CARCINOMA   Past Surgical History:  Procedure Laterality Date  . APPENDECTOMY  2006  . BREAST BIOPSY Right  09/22/2019   INVASIVE MAMMARY CARCINOMA  . BREAST CYST ASPIRATION Left 1999  . BREAST LUMPECTOMY Left 04/23/2020   surgery with NL and SN   . CHOLECYSTECTOMY  2006  . COLONOSCOPY    . PORTACATH PLACEMENT Left 10/06/2019   Procedure: INSERTION PORT-A-CATH;  Surgeon: Robert Bellow, MD;  Location: [facility] ORS;  Service: General;  Laterality: Left;  . SUBMUCOSAL TATTOO INJECTION Right 10/06/2019   Procedure: Right axillary TATTOO INJECTION;  Surgeon: Robert Bellow, MD;  Location: [facility] ORS;  Service: General;  Laterality: Right;  Marland Kitchen VAGINAL DELIVERY     x3   Social History   Socioeconomic History  . Marital status: Married    Spouse name: Chrissie Noa  . Number of children: 3  . Years of education: Not on file  . Highest education level: Not on file  Occupational History  . Occupation: Glass blower/designer: [employer]  Tobacco Use  . Smoking status: Former Smoker    Years: 1.00    Types: Cigarettes    Quit date: 12/26/2005    Years since quitting: 14.3  . Smokeless tobacco: Never Used  . Tobacco comment: smoked for less than 1 years,  1 cig/day  Vaping Use  . Vaping Use: Never used  Substance and Sexual Activity  . Alcohol use: No  . Drug use: No  . Sexual activity: Never    Partners:  Female  Other Topics Concern  . Not on file  Social History Narrative   Widowed, March 2014; remarried.      [employer]; quit smoking 1ppw; no alcohol.       Social Determinants of Health   Financial Resource Strain: Not on file  Food Insecurity: Not on file  Transportation Needs: Not on file  Physical Activity: Not on file  Stress: Not on file  Social Connections: Not on file  Intimate Partner Violence: Not on file   Social History   Social History Narrative   Widowed, March 2014; remarried.      [employer] ret[employer]; quit smoking 1ppw; no alcohol.         ROS: Negative.     PE: HEENT: Negative. Lungs: Clear. Cardio: RR.   Assessment/Plan:  Proceed with planned right  breast wide excision, SLN biopsy, possible axillary dissection.   Forest Gleason [facility] 04/23/2020

## 2020-04-23 NOTE — Discharge Instructions (Signed)
AMBULATORY SURGERY  °DISCHARGE INSTRUCTIONS ° ° °1) The drugs that you were given will stay in your system until tomorrow so for the next 24 hours you should not: ° °A) Drive an automobile °B) Make any legal decisions °C) Drink any alcoholic beverage ° ° °2) You may resume regular meals tomorrow.  Today it is better to start with liquids and gradually work up to solid foods. ° °You may eat anything you prefer, but it is better to start with liquids, then soup and crackers, and gradually work up to solid foods. ° ° °3) Please notify your doctor immediately if you have any unusual bleeding, trouble breathing, redness and pain at the surgery site, drainage, fever, or pain not relieved by medication. ° ° ° °4) Additional Instructions: ° ° ° ° ° ° ° °Please contact your physician with any problems or Same Day Surgery at [phone], Monday through Friday 6 am to 4 pm, or [place] [facility] [place] Main number at [phone]. °

## 2020-04-23 NOTE — Anesthesia Postprocedure Evaluation (Signed)
Anesthesia Post Note  Patient: Ashlee Cox  Procedure(s) Performed: BREAST LUMPECTOMY WITH SENTINEL LYMPH NODE BX (Right ) BREAST LUMPECTOMY WITH NEEDLE LOCALIZATION (Right )  Patient location during evaluation: PACU Anesthesia Type: General Level of consciousness: awake and alert Pain management: pain level controlled Vital Signs Assessment: post-procedure vital signs reviewed and stable Respiratory status: spontaneous breathing, nonlabored ventilation, respiratory function stable and patient connected to nasal cannula oxygen Cardiovascular status: blood pressure returned to baseline and stable Postop Assessment: no apparent nausea or vomiting Anesthetic complications: no   No complications documented.   Last Vitals:  Vitals:   04/23/20 1531 04/23/20 1545  BP: 127/72 138/75  Pulse:  87  Resp:  20  Temp: (!) 36.1 C   SpO2: 95% 94%    Last Pain:  Vitals:   04/23/20 1545  TempSrc:   PainSc: 0-No pain                 Arita Miss

## 2020-04-23 NOTE — Op Note (Addendum)
Preoperative diagnosis: Invasive mammary carcinoma the right breast, upper outer quadrant, status post neoadjuvant chemotherapy.  Postoperative diagnosis: Same.  Operative procedure: Right breast wide excision with ultrasound and wire localization, reexcision of anterior/superior, medial and inferior margins, tissue transfer, greater than 10 cm, sentinel node biopsy with axillary dissection.  Operating surgeon: Hervey Ard, MD.  Anesthesia: General endotracheal, Marcaine 0.5% with 1: 200,000 units of epinephrine.  Estimated blood loss: 10 cc.  Clinical note: This 70 year old woman was identified with a palpable mass in the upper outer quadrant of the right breast and imaging showed a 2+ centimeter mass as well as an adenopathy in the axilla.  Biopsy of the latter was positive.  She is undergone neoadjuvant chemotherapy with an excellent clinical response.  Ultrasound in the office showed no residual lesion in the breast and no notable nodes.  She underwent wire localization this morning without incident as well as injection with technetium sulfur colloid.  SCD stockings for DVT prevention.  No indication for antibiotics.  Operative note: With the patient under adequate general endotracheal anesthesia (bile noted in the posterior pharynx during induction) the patient tolerated general anesthesia well.  Alcohol prep was applied to the right areola and 5 cc of 0.5% methylene blue was instilled in the subareolar plexus.  The breast chest and axilla was then cleansed with ChloraPrep and draped.  Ultrasound was used to identify the localizing wire in the upper outer quadrant of the right breast. An image was placed in the permanent record.  Local anesthesia was infiltrated for postoperative analgesia.  This area was then marked out as a block of tissue approximately 3 x 4 x 4 cm.  This excision was from the area below the subcutaneous fat down to and including the pectoralis fascia.  Faxitron image  showed the clip in the center of the specimen.  Attention was turned to the axilla while the breast specimen was processed by the laboratory.  Ultrasound suggested a prominent node consistent with the prior carbon black injection.  Scanning with the node seeker showed very low counts of 80-100 and a fairly diffuse area.  Local anesthesia was infiltrated and a transverse incision was made in the lower axilla.  The skin was incised sharply and remaining dissection completed with electrocautery.  This was carried down to the lateral border of the pectoralis muscle.  No blue lymphatics were seen.  Counts remained diffuse and less than 100.  After exploring through the axilla and unable to locate the previously tattooed lymph node it was elected to proceed axillary dissection.  The axillary contents were swept from the level below the axillary vein inferior laterally.  The long thoracic nerve of Bell and thoracodorsal nerve artery and vein bundles were protected and functional at the end of the procedure.  Hemostasis was electrocautery.  The intercostal brachial nerve was preserved.  After the [facility] wound protector was removed it was found that the tattooed node was just inferior to the field of dissection and this was removed and sent separately as a specimen in formalin for routine histology.  The wound was closed with 2-0 Vicryl into the axillary envelope and then the subcutaneous fat.  The skin was closed with a running 4-0 Vicryl subcuticular suture.  The pathologist reported a stellate-like residual scar in the resected specimen extending to the inferior margin and to the anterior superior area of the wide excision site.  It was elected to resect an additional 6 mm including the anterior superior, medial and inferior  margins.  This was orientated and handcarried to the pathologist at the end of the procedure.  The breast tissue was elevated off the underlying pectoralis muscle circumferentially to close  the > 10 cm sq defect.  The deep breast parenchyma and fascia was then approximated with interrupted 2-0 Vicryl figure-of-eight sutures.  There was some rippling of the skin and flaps were elevated circumferentially.  The subcutaneous fat was then approximated with interrupted 2-0 Vicryl sutures.  The skin was closed with a running 4-0 Vicryl subcuticular suture.  The patient was somewhat rambunctious during extubation.  She was flailing her right arm and there was evidence of about 5 cc of blood on the Telfa pad in the axilla.  No evidence of hematoma.  Benzoin, Steri-Strips, fluff gauze and a compressive wrap were then applied.  The patient was taken to the recovery room in stable condition.

## 2020-04-23 NOTE — Anesthesia Preprocedure Evaluation (Signed)
Anesthesia Evaluation  Patient identified by MRN, date of birth, ID band Patient awake    Reviewed: Allergy & Precautions, NPO status , Patient's Chart, lab work & pertinent test results  History of Anesthesia Complications Negative for: history of anesthetic complications  Airway Mallampati: II  TM Distance: >3 FB Neck ROM: Full    Dental  (+) Teeth Intact   Pulmonary neg pulmonary ROS, neg sleep apnea, neg COPD, Patient abstained from smoking.Not current smoker, former smoker,    Pulmonary exam normal breath sounds clear to auscultation       Cardiovascular Exercise Tolerance: Good METShypertension, (-) CAD and (-) Past MI (-) dysrhythmias  Rhythm:Regular Rate:Normal - Systolic murmurs    Neuro/Psych negative psych ROS   GI/Hepatic GERD  Medicated and Controlled,(+)     (-) substance abuse  ,   Endo/Other  diabetes  Renal/GU negative Renal ROS     Musculoskeletal   Abdominal   Peds  Hematology   Anesthesia Other Findings Past Medical History: 2021: Cancer ([place])     Comment:  right breast No date: Cyst, breast     Comment:  benign No date: Diabetes mellitus without complication ([place]) No date: Family history of melanoma No date: Family history of ovarian cancer No date: GERD (gastroesophageal reflux disease) No date: H/O: rheumatic fever 2010: History of colonoscopy     Comment:  done bc of bleeding,  normal , due back in 5 yrs               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 date: Hyperlipidemia No date: Hypertension No date: Menopause     Comment:  at age 89 09/2019: Personal history of chemotherapy     Comment:  RIGHT  INVASIVE MAMMARY CARCINOMA  Reproductive/Obstetrics                             Anesthesia Physical Anesthesia Plan  ASA: II  Anesthesia Plan: General   Post-op Pain Management:    Induction: Intravenous  PONV Risk Score and Plan: 3 and Ondansetron and  Dexamethasone  Airway Management Planned: LMA  Additional Equipment: None  Intra-op Plan:   Post-operative Plan: Extubation in OR  Informed Consent: I have reviewed the patients History and Physical, chart, labs and discussed the procedure including the risks, benefits and alternatives for the proposed anesthesia with the patient or authorized representative who has indicated his/her understanding and acceptance.     Dental advisory given  Plan Discussed with: CRNA and Surgeon  Anesthesia Plan Comments: (Discussed risks of anesthesia with patient, including PONV, sore throat, lip/dental damage. Rare risks discussed as well, such as cardiorespiratory and neurological sequelae. Patient understands.)        Anesthesia Quick Evaluation

## 2020-04-23 NOTE — Transfer of Care (Signed)
Immediate Anesthesia Transfer of Care Note  Patient: Ashlee Cox  Procedure(s) Performed: BREAST LUMPECTOMY WITH SENTINEL LYMPH NODE BX (Right ) BREAST LUMPECTOMY WITH NEEDLE LOCALIZATION (Right )  Patient Location: PACU  Anesthesia Type:General  Level of Consciousness: awake, alert  and oriented  Airway & Oxygen Therapy: Patient Spontanous Breathing and Patient connected to face mask  Post-op Assessment: Report given to RN  Post vital signs: stable  Last Vitals:  Vitals Value Taken Time  BP 127/72 04/23/20 1530  Temp    Pulse 90 04/23/20 1536  Resp 19 04/23/20 1536  SpO2 96 % 04/23/20 1536  Vitals shown include unvalidated device data.  Last Pain:  Vitals:   04/23/20 1043  TempSrc: Temporal  PainSc: 0-No pain         Complications: No complications documented.

## 2020-04-23 NOTE — Anesthesia Procedure Notes (Signed)
Procedure Name: Intubation Date/Time: 04/23/2020 1:28 PM Performed by: Johney Maine, CRNA Pre-anesthesia Checklist: Patient identified, Patient being monitored, Timeout performed, Emergency Drugs available and Suction available Patient Re-evaluated:Patient Re-evaluated prior to induction Oxygen Delivery Method: Circle system utilized Preoxygenation: Pre-oxygenation with 100% oxygen Induction Type: IV induction Ventilation: Mask ventilation without difficulty Laryngoscope Size: Miller and 2 Grade View: Grade I Tube type: Oral Tube size: 7.0 mm Number of attempts: 1 Placement Confirmation: ETT inserted through vocal cords under direct vision,  positive ETCO2 and breath sounds checked- equal and bilateral Secured at: 21 cm Tube secured with: Tape Dental Injury: Teeth and Oropharynx as per pre-operative assessment

## 2020-04-24 ENCOUNTER — Encounter: Payer: Self-pay | Admitting: General Surgery

## 2020-04-26 ENCOUNTER — Other Ambulatory Visit: Payer: Self-pay | Admitting: Internal Medicine

## 2020-04-28 ENCOUNTER — Ambulatory Visit: Payer: PPO | Admitting: Internal Medicine

## 2020-04-30 LAB — SURGICAL PATHOLOGY

## 2020-05-05 ENCOUNTER — Other Ambulatory Visit: Payer: Self-pay | Admitting: Oncology

## 2020-05-05 ENCOUNTER — Telehealth: Payer: Self-pay | Admitting: *Deleted

## 2020-05-05 NOTE — Telephone Encounter (Signed)
Left vm for patient- requested return phone call to set up follow-up apt with Dr. Rogue Bussing. Dr. B wanted to see the patient in the clinic this Friday am.

## 2020-05-06 ENCOUNTER — Other Ambulatory Visit: Payer: Self-pay | Admitting: Oncology

## 2020-05-07 ENCOUNTER — Inpatient Hospital Stay: Payer: PPO | Attending: Internal Medicine | Admitting: Internal Medicine

## 2020-05-07 ENCOUNTER — Other Ambulatory Visit: Payer: Self-pay

## 2020-05-07 DIAGNOSIS — C50211 Malignant neoplasm of upper-inner quadrant of right female breast: Secondary | ICD-10-CM | POA: Insufficient documentation

## 2020-05-07 DIAGNOSIS — Z171 Estrogen receptor negative status [ER-]: Secondary | ICD-10-CM | POA: Insufficient documentation

## 2020-05-07 DIAGNOSIS — I152 Hypertension secondary to endocrine disorders: Secondary | ICD-10-CM

## 2020-05-07 DIAGNOSIS — E119 Type 2 diabetes mellitus without complications: Secondary | ICD-10-CM | POA: Insufficient documentation

## 2020-05-07 DIAGNOSIS — E113559 Type 2 diabetes mellitus with stable proliferative diabetic retinopathy, unspecified eye: Secondary | ICD-10-CM | POA: Diagnosis not present

## 2020-05-07 DIAGNOSIS — G620 Drug-induced polyneuropathy: Secondary | ICD-10-CM | POA: Insufficient documentation

## 2020-05-07 DIAGNOSIS — E669 Obesity, unspecified: Secondary | ICD-10-CM | POA: Diagnosis not present

## 2020-05-07 DIAGNOSIS — E1169 Type 2 diabetes mellitus with other specified complication: Secondary | ICD-10-CM | POA: Diagnosis not present

## 2020-05-07 DIAGNOSIS — Z452 Encounter for adjustment and management of vascular access device: Secondary | ICD-10-CM | POA: Diagnosis not present

## 2020-05-07 DIAGNOSIS — E1159 Type 2 diabetes mellitus with other circulatory complications: Secondary | ICD-10-CM | POA: Diagnosis not present

## 2020-05-07 NOTE — Assessment & Plan Note (Addendum)
#   Right breast cancer-Stage IIB- T2N1-TRIPLE NEGATIVE;  s/p neoadjuvant therapy carbo platinum-Taxol; Adriamycin Cytoxan- [id number].  #I reviewed the r the prognostic importance of partial response of triple negative breast cancer post neoadjuvant chemotherapy.  Patient could potentially be candidate for Xeloda based therapy after radiation.  Proceed with radiation this time.  # DM-post breakfast blood sugars -184;  STABLE.   continue metformin 1000 mg BID.   # PN-1-2 sec to taxol-on Neurontin 100 nightly- STABLE.  #Port malfunction-dye study September, 2021 possible fibrin sheath.  S/p TPA-resolved; port flush every 2 to 3 months..  # DISPOSITION: # referral to Dr.Crystal re: breast cancer; port flush on the same day # follow up in 2 months- MD; labs-port flush  cbc/cmp-Dr.

## 2020-05-07 NOTE — Progress Notes (Signed)
Wants to know if she needs to continue taking the gabapentin.

## 2020-05-07 NOTE — Progress Notes (Signed)
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 Byrnett.] [id number] Carbo-taxol-AC;  JAN 2022-s/p Lumpectomy & ALND- [ypT1a (18m ) ypN1 (2/11 LN-positive)]  # SURVIVORSHIP: Pending  # GENETICS: Pending  DIAGNOSIS: Right breast cancer triple negative  STAGE:   IIB      ;  GOALS: cure  CURRENT/MOST RECENT THERAPY : RT    Carcinoma of upper-inner quadrant of right breast in female, estrogen receptor negative ([place]  10/07/2019 Initial Diagnosis   Carcinoma of upper-inner quadrant of right breast in female, estrogen receptor negative ([place]   12/04/2019 Genetic Testing   Negative genetic testing. No pathogenic variants identified on the [facility] Common Hereditary Cancers Panel + Skin Cancers Panel. The report date is 12/04/2019.  The Common Hereditary Cancers Panel + Skin Cancers Panel offered by [facility] includes sequencing and/or deletion duplication testing of the following 54 genes: APC*, ATM*, AXIN2, BAP1, BARD1, BMPR1A, BRCA1, BRCA2, BRIP1, CDH1, CDK4, CDKN2A (p14ARF), CDKN2A (p16INK4a), CHEK2, CTNNA1, DICER1*, EPCAM*, GREM1*, HOXB13, KIT, MEN1*, MITF*, MLH1*, MSH2*, MSH3*, MSH6*, MUTYH, NBN, NF1*, NTHL1, PALB2, PDGFRA, PMS2*, POLD1*, POLE, POT1, PTCH1, PTEN*, RAD50, RAD51C, RAD51D, RB1*, RNF43, SDHA*, SDHB, SDHC*, SDHD, SMAD4, SMARCA4, STK11, SUFU, TP53, TSC1*, TSC2, VHL.    05/07/2020 Cancer Staging   Staging form: Breast, AJCC 8th Edition - Clinical: Stage IIIB (cT2, cN1, cM0, G3, ER-, PR-, HER2-) - Signed by BCammie Sickle MD on 05/07/2020      HISTORY OF PRESENTING ILLNESS:  Ashlee Cox 70y.o.  female patient with triple negative breast cancer stage IIb neoadjuvant chemotherapy is here for  follow-up./Results of the pathology procedure.  Patient is recovering well post surgery.  Denies any signs of infection.  Denies any worsening of tingling numbness.  No falls.  She continues to be on gabapentin.  Review of Systems  Constitutional: Positive for malaise/fatigue. Negative for chills, diaphoresis, fever and weight loss.  HENT: Negative for nosebleeds and sore throat.   Eyes: Negative for double vision.  Respiratory: Negative for cough, hemoptysis, sputum production and wheezing.   Cardiovascular: Negative for chest pain, palpitations, orthopnea and leg swelling.  Gastrointestinal: Negative for abdominal pain, blood in stool, constipation and melena.  Genitourinary: Negative for dysuria, frequency and urgency.  Musculoskeletal: Negative for back pain and joint pain.  Skin: Negative.  Negative for itching and rash.  Neurological: Positive for tingling. Negative for dizziness, focal weakness, weakness and headaches.  Endo/Heme/Allergies: Does not bruise/bleed easily.  Psychiatric/Behavioral: Negative for depression. The patient is not nervous/anxious and does not have insomnia.      MEDICAL HISTORY:  Past Medical History:  Diagnosis Date  . Cancer ([facility] 2021   right breast  . Cyst, breast    benign  . Diabetes mellitus without complication ([place]   . Family history of melanoma   . Family history of ovarian cancer   . GERD (gastroesophageal reflux disease)   . H/O: rheumatic fever   . 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 Hyperlipidemia   . Hypertension   . Menopause    at age 70 . Personal history of chemotherapy 09/2019   RIGHT  INVASIVE MAMMARY CARCINOMA    SURGICAL HISTORY: Past Surgical History:  Procedure Laterality Date  . APPENDECTOMY  2006  . BREAST BIOPSY Right 09/22/2019   INVASIVE MAMMARY CARCINOMA  . BREAST CYST ASPIRATION Left 1999  . BREAST LUMPECTOMY Left 04/23/2020   surgery with NL and  SN   . BREAST LUMPECTOMY WITH NEEDLE LOCALIZATION Right 04/23/2020   Procedure: BREAST LUMPECTOMY WITH NEEDLE LOCALIZATION;  Surgeon: Robert Bellow, MD;  Location: [facility] ORS;  Service: General;  Laterality: Right;  . BREAST LUMPECTOMY WITH SENTINEL LYMPH NODE BIOPSY Right 04/23/2020   Procedure: BREAST LUMPECTOMY WITH SENTINEL LYMPH NODE BX;  Surgeon: Robert Bellow, MD;  Location: [facility] ORS;  Service: General;  Laterality: Right;  . CHOLECYSTECTOMY  2006  . COLONOSCOPY    . PORTACATH PLACEMENT Left 10/06/2019   Procedure: INSERTION PORT-A-CATH;  Surgeon: Robert Bellow, MD;  Location: [facility] ORS;  Service: General;  Laterality: Left;  . SUBMUCOSAL TATTOO INJECTION Right 10/06/2019   Procedure: Right axillary TATTOO INJECTION;  Surgeon: Robert Bellow, MD;  Location: [facility] ORS;  Service: General;  Laterality: Right;  Marland Kitchen VAGINAL DELIVERY     x3    SOCIAL HISTORY: Social History   Socioeconomic History  . Marital status: Married    Spouse name: Ashlee Cox  . Number of children: 3  . Years of education: Not on file  . Highest education level: Not on file  Occupational History  . Occupation: Glass blower/designer: [employer]  Tobacco Use  . Smoking status: Former Smoker    Years: 1.00    Types: Cigarettes    Quit date: 12/26/2005    Years since quitting: 14.3  . Smokeless tobacco: Never Used  . Tobacco comment: smoked for less than 1 years,  1 cig/day  Vaping Use  . Vaping Use: Never used  Substance and Sexual Activity  . Alcohol use: No  . Drug use: No  . Sexual activity: Never    Partners: Female  Other Topics Concern  . Not on file  Social History Narrative   Widowed, March 2014; remarried.      [employer]; quit smoking 1ppw; no alcohol.       Social Determinants of Health   Financial Resource Strain: Not on file  Food Insecurity: Not on file  Transportation Needs: Not on file  Physical Activity: Not on file  Stress: Not on file  Social Connections: Not on file   Intimate Partner Violence: Not on file    FAMILY HISTORY: Family History  Problem Relation Age of Onset  . Cancer Mother 65       ovarian- died 4-5 years.   . Heart disease Mother 32  . Diabetes Mother   . Stroke Father 62  . Diabetes Father   . Diabetes Sister   . Melanoma Sister        survived  . Breast cancer Neg Hx     ALLERGIES:  has No Known Allergies.  MEDICATIONS:  Current Outpatient Medications  Medication Sig Dispense Refill  . acetaminophen (TYLENOL) 500 MG tablet Take 500 mg by mouth every 8 (eight) hours as needed for moderate pain.    Marland Kitchen gabapentin (NEURONTIN) 100 MG capsule Take 1 capsule by mouth at bedtime 30 capsule 0  . HYDROcodone-acetaminophen (NORCO/VICODIN) 5-325 MG tablet Take 1 tablet by mouth every 4 (four) hours as needed for moderate pain. 20 tablet 0  . losartan-hydrochlorothiazide (HYZAAR) 50-12.5 MG tablet Take 1 tablet by mouth daily. 90 tablet 3  . lovastatin (MEVACOR) 40 MG tablet TAKE 1 TABLET BY MOUTH  AT BEDTIME (Patient taking differently: Take 40 mg by mouth at bedtime.) 90 tablet 0  . metFORMIN (GLUCOPHAGE) 500 MG tablet TAKE 2 TABLETS BY MOUTH TWICE DAILY WITH A MEAL 120 tablet 0  . Multiple Vitamin (MULTIVITAMIN) tablet Take 1 tablet by mouth daily.    Marland Kitchen omeprazole (PRILOSEC) 20 MG capsule Take 1 capsule by mouth once daily 90 capsule 0   No current facility-administered medications for this visit.      Marland Kitchen  PHYSICAL EXAMINATION: ECOG PERFORMANCE STATUS: 0 - Asymptomatic  Vitals:   05/07/20 0923  BP: (!) 148/71  Pulse: 91  Resp: 16  Temp: 98.7 F (37.1 C)  SpO2: 97%   Filed Weights   05/07/20 0923  Weight: 187 lb 12.8 oz (85.2 kg)    Physical Exam Constitutional:      Comments: Ambulating independently.  Accompanied by her husband.  HENT:     Head: Normocephalic and atraumatic.     Mouth/Throat:     Pharynx: No oropharyngeal exudate.  Eyes:     Pupils: Pupils are equal, round, and reactive to light.   Cardiovascular:     Rate and Rhythm: Normal rate and regular rhythm.  Pulmonary:     Effort: Pulmonary effort is normal. No respiratory distress.     Breath sounds: Normal breath sounds. No wheezing.  Abdominal:     General: Bowel sounds are normal. There is no distension.     Palpations: Abdomen is soft. There is no mass.     Tenderness: There is no abdominal tenderness. There is no guarding or rebound.  Musculoskeletal:        General: No tenderness. Normal range of motion.     Cervical back: Normal range of motion and neck supple.  Skin:    General: Skin is warm.  Neurological:     Mental Status: She is alert and oriented to person, place, and time.  Psychiatric:        Mood and Affect: Affect normal.      LABORATORY DATA:  I have reviewed the data as listed Lab Results  Component Value Date   WBC 5.6 03/24/2020   HGB 10.9 (L) 03/24/2020   HCT 33.4 (L) 03/24/2020   MCV 95.7 03/24/2020   PLT 267 03/24/2020   Recent Labs    12/02/19 0922 12/09/19 0844 12/23/19 0846 12/30/19 0839 01/21/20 0842 02/11/20 0933 03/03/20 0911 03/24/20 0859 04/21/20 1010  NA 144 140 140   < > 137   < > 139 135 140  K 3.4* 3.3* 3.3*   < > 3.6   < > 3.2* 3.4* 3.5  CL 106 104 104   < > 104   < > 100 100 104  CO2 26 23 24    < > 21*   < > 25 23 22   GLUCOSE 194* 204* 237*   < > 175*   < > 184* 219* 168*  BUN 10 13 10    < > 16   < > 15 12 13   CREATININE 0.68 0.79 0.77   < > 0.77   < > 0.89 0.63 0.69  CALCIUM 9.1 8.9 9.1   < > 8.8*   < > 8.8* 8.9 9.9  GFRNONAA >60 >60 >60   < > >60   < > >60 >60 >60  GFRAA >60 >60 >60  --   --   --   --   --   --   PROT  --   --  6.7   < >  6.5  --  6.6 7.0  --   ALBUMIN  --   --  3.8   < > 3.8  --  3.9 3.9  --   AST  --   --  56*   < > 61*  --  58* 67*  --   ALT  --   --  43   < > 47*  --  46* 47*  --   ALKPHOS  --   --  66   < > 67  --  72 79  --   BILITOT  --   --  0.6   < > 0.8  --  0.6 0.8  --    < > = values in this interval not displayed.     RADIOGRAPHIC STUDIES: I have personally reviewed the radiological images as listed and agreed with the findings in the report. NM Sentinel Node Inj-No Rpt (Breast)  Result Date: 04/23/2020 Sulfur colloid was injected by the nuclear medicine technologist for melanoma sentinel node.   MM Breast Surgical Specimen  Result Date: 04/23/2020 CLINICAL DATA:  Right breast lumpectomy. EXAM: SPECIMEN RADIOGRAPH OF THE RIGHT BREAST COMPARISON:  Previous exam(s). FINDINGS: Status post excision of the right breast. The wire tip and biopsy marker clip are present and are marked for pathology. IMPRESSION: Specimen radiograph of the right breast. Electronically Signed   By: Fidela Salisbury M.D.   On: 04/23/2020 15:48   MM RT PLC BREAST LOC DEV   1ST LESION  INC MAMMO GUIDE  Result Date: 04/23/2020 CLINICAL DATA:  Preoperative needle localization for right breast upper outer quadrant mass, post neoadjuvant chemotherapy. EXAM: NEEDLE LOCALIZATION OF THE RIGHT BREAST WITH MAMMO GUIDANCE COMPARISON:  Previous exams. PROCEDURE: Patient presents for needle localization prior to right breast lumpectomy. I met with the patient and we discussed the procedure of needle localization including benefits and alternatives. We discussed the high likelihood of a successful procedure. We discussed the risks of the procedure, including infection, bleeding, tissue injury, and further surgery. Informed, written consent was given. The usual time-out protocol was performed immediately prior to the procedure. Using mammographic guidance, sterile technique, 1% lidocaine and a 7 cm modified Kopans needle, right breast upper outer quadrant previously biopsied mass containing ribbon shaped marker was localized using a lateral approach. The distance from skin surface to the post biopsy marker is 5 cm. The marker is located at the middle reinforced portion of the wire. The images were marked for Dr. Bary Castilla. IMPRESSION: Needle localization  of the right breast. No apparent complications. Electronically Signed   By: Fidela Salisbury M.D.   On: 04/23/2020 09:33    ASSESSMENT & PLAN:   Carcinoma of upper-inner quadrant of right breast in female, estrogen receptor negative ([place]) # Right breast cancer-Stage IIB- T2N1-TRIPLE NEGATIVE;  s/p neoadjuvant therapy carbo platinum-Taxol; Adriamycin Cytoxan- ypT1ypN1.  #I reviewed the r the prognostic importance of partial response of triple negative breast cancer post neoadjuvant chemotherapy.  Patient could potentially be candidate for Xeloda based therapy after radiation.  Proceed with radiation this time.  # DM-post breakfast blood sugars -184;  STABLE.   continue metformin 1000 mg BID.   # PN-1-2 sec to taxol-on Neurontin 100 nightly- STABLE.  #Port malfunction-dye study September, 2021 possible fibrin sheath.  S/p TPA-resolved; port flush every 2 to 3 months..  # DISPOSITION: # referral to Dr.Crystal re: breast cancer; port flush on the same day # follow up in 2 months- MD; labs-port flush  cbc/cmp-Dr.  All questions were answered. The patient/family knows to call the clinic with any problems, questions or concerns.   Cammie Sickle, MD 05/07/2020 10:12 AM

## 2020-05-11 ENCOUNTER — Encounter: Payer: Self-pay | Admitting: Radiation Oncology

## 2020-05-11 DIAGNOSIS — M6530 Trigger finger, unspecified finger: Secondary | ICD-10-CM | POA: Insufficient documentation

## 2020-05-12 ENCOUNTER — Other Ambulatory Visit: Payer: Self-pay

## 2020-05-12 ENCOUNTER — Inpatient Hospital Stay: Payer: PPO

## 2020-05-12 ENCOUNTER — Ambulatory Visit
Admission: RE | Admit: 2020-05-12 | Discharge: 2020-05-12 | Disposition: A | Discharge status: Home / Self Care | Payer: PPO | Source: Ambulatory Visit | Attending: Radiation Oncology | Admitting: Radiation Oncology

## 2020-05-12 VITALS — BP 161/89 | HR 91 | Temp 97.5°F | Wt 189.5 lb

## 2020-05-12 DIAGNOSIS — Z808 Family history of malignant neoplasm of other organs or systems: Secondary | ICD-10-CM | POA: Diagnosis not present

## 2020-05-12 DIAGNOSIS — E119 Type 2 diabetes mellitus without complications: Secondary | ICD-10-CM | POA: Insufficient documentation

## 2020-05-12 DIAGNOSIS — K219 Gastro-esophageal reflux disease without esophagitis: Secondary | ICD-10-CM | POA: Diagnosis not present

## 2020-05-12 DIAGNOSIS — Z79899 Other long term (current) drug therapy: Secondary | ICD-10-CM | POA: Diagnosis not present

## 2020-05-12 DIAGNOSIS — Z7984 Long term (current) use of oral hypoglycemic drugs: Secondary | ICD-10-CM | POA: Diagnosis not present

## 2020-05-12 DIAGNOSIS — E785 Hyperlipidemia, unspecified: Secondary | ICD-10-CM | POA: Diagnosis not present

## 2020-05-12 DIAGNOSIS — Z9221 Personal history of antineoplastic chemotherapy: Secondary | ICD-10-CM | POA: Insufficient documentation

## 2020-05-12 DIAGNOSIS — Z8041 Family history of malignant neoplasm of ovary: Secondary | ICD-10-CM | POA: Diagnosis not present

## 2020-05-12 DIAGNOSIS — Z87891 Personal history of nicotine dependence: Secondary | ICD-10-CM | POA: Insufficient documentation

## 2020-05-12 DIAGNOSIS — Z85820 Personal history of malignant melanoma of skin: Secondary | ICD-10-CM | POA: Insufficient documentation

## 2020-05-12 DIAGNOSIS — Z171 Estrogen receptor negative status [ER-]: Secondary | ICD-10-CM | POA: Diagnosis not present

## 2020-05-12 DIAGNOSIS — I10 Essential (primary) hypertension: Secondary | ICD-10-CM | POA: Diagnosis not present

## 2020-05-12 DIAGNOSIS — C50211 Malignant neoplasm of upper-inner quadrant of right female breast: Secondary | ICD-10-CM | POA: Diagnosis not present

## 2020-05-12 DIAGNOSIS — Z95828 Presence of other vascular implants and grafts: Secondary | ICD-10-CM

## 2020-05-12 MED ORDER — HEPARIN SOD (PORK) LOCK FLUSH 100 UNIT/ML IV SOLN
INTRAVENOUS | Status: AC
  Filled 2020-05-12: qty 5

## 2020-05-12 MED ORDER — HEPARIN SOD (PORK) LOCK FLUSH 100 UNIT/ML IV SOLN
500.0000 [IU] | Freq: Once | INTRAVENOUS | Status: AC
  Administered 2020-05-12: 500 [IU] via INTRAVENOUS
  Filled 2020-05-12: qty 5

## 2020-05-12 MED ORDER — SODIUM CHLORIDE 0.9% FLUSH
10.0000 mL | Freq: Once | INTRAVENOUS | Status: AC
  Administered 2020-05-12: 10 mL via INTRAVENOUS
  Filled 2020-05-12: qty 10

## 2020-05-12 NOTE — Consult Note (Signed)
NEW PATIENT EVALUATION  Name: Ashlee Cox  MRN: [id number]  Date:   05/12/2020     DOB: 1950-05-11   This 70 y.o. female patient presents to the clinic for initial evaluation of stage IIb (T2 N1 M0) triple negative invasive mammary carcinoma of the right breast status post neoadjuvant chemotherapy then wide local excision and axillary lymph node dissection.  REFERRING PHYSICIAN: [name], MD  CHIEF COMPLAINT:  Chief Complaint  Patient presents with  . Breast Cancer    DIAGNOSIS: The encounter diagnosis was Carcinoma of upper-inner quadrant of right breast in female, estrogen receptor negative ([place]).   PREVIOUS INVESTIGATIONS:  Pathology report reviewed Mammogram ultrasound and PET CT scan reviewed Clinical notes reviewed  HPI: Patient is a 70 year old female who presented with atypical right chest pain and a palpable mass in her right breast.  This was confirmed on mammogram to be a 2.9 cm palpable mass 10:30 position of the right breast suspicious for malignancy.  There was a single right axillary lymph node with focal eccentric cortical thickening suspicious for metastatic disease.  PET/CT demonstrated right lateral breast lesion which was hypermetabolic consistent with breast cancer.  There were few small mediastinal lymph nodes indeterminate but more likely inflammatory.  She did also have a single hypermetabolic metastatic right axillary lymph node.  Biopsy of the lymph node was positive for metastatic carcinoma with abundant necrosis.  Also right breast biopsy was positive for invasive mammary carcinoma.  Tumor was triple negative.  She underwent neoadjuvant chemotherapy using AC.  Patient then underwent wide local excision and axillary lymph node dissection showing 2 mm residual disease overall grade 3.  Lymph vascular invasion was present.  11 lymph nodes were removed to were positive for metastatic disease largest metastasis 9 mm.  Margins were clear but close at 1 mm  from superior inferior margin.  All margins were negative for DCIS.  She has done well postoperatively.  She is now referred to radiation oncology for consideration of treatment.  PLANNED TREATMENT REGIMEN: Right breast and peripheral lymphatic radiation  PAST MEDICAL HISTORY:  has a past medical history of Cancer ([place]) (2021), Cyst, breast, Diabetes mellitus without complication ([place]), Family history of melanoma, Family history of ovarian cancer, GERD (gastroesophageal reflux disease), H/O: rheumatic fever, History of colonoscopy (2010), Hyperlipidemia, Hypertension, Menopause, and Personal history of chemotherapy (09/2019).    PAST SURGICAL HISTORY:  Past Surgical History:  Procedure Laterality Date  . APPENDECTOMY  2006  . BREAST BIOPSY Right 09/22/2019   INVASIVE MAMMARY CARCINOMA  . BREAST CYST ASPIRATION Left 1999  . BREAST LUMPECTOMY Left 04/23/2020   surgery with NL and SN   . BREAST LUMPECTOMY WITH NEEDLE LOCALIZATION Right 04/23/2020   Procedure: BREAST LUMPECTOMY WITH NEEDLE LOCALIZATION;  Surgeon: Robert Bellow, MD;  Location: [facility] ORS;  Service: General;  Laterality: Right;  . BREAST LUMPECTOMY WITH SENTINEL LYMPH NODE BIOPSY Right 04/23/2020   Procedure: BREAST LUMPECTOMY WITH SENTINEL LYMPH NODE BX;  Surgeon: Robert Bellow, MD;  Location: [facility] ORS;  Service: General;  Laterality: Right;  . CHOLECYSTECTOMY  2006  . COLONOSCOPY    . PORTACATH PLACEMENT Left 10/06/2019   Procedure: INSERTION PORT-A-CATH;  Surgeon: Robert Bellow, MD;  Location: [facility] ORS;  Service: General;  Laterality: Left;  . SUBMUCOSAL TATTOO INJECTION Right 10/06/2019   Procedure: Right axillary TATTOO INJECTION;  Surgeon: Robert Bellow, MD;  Location: [facility] ORS;  Service: General;  Laterality: Right;  Marland Kitchen VAGINAL DELIVERY  x3    FAMILY HISTORY: family history includes Cancer (age of onset: 78) in her mother; Diabetes in her father, mother, and sister; Heart disease (age of onset: 80) in  her mother; Melanoma in her sister; Stroke (age of onset: 63) in her father.  SOCIAL HISTORY:  reports that she quit smoking about 14 years ago. Her smoking use included cigarettes. She quit after 1.00 year of use. She has never used smokeless tobacco. She reports that she does not drink alcohol and does not use drugs.  ALLERGIES: Patient has no known allergies.  MEDICATIONS:  Current Outpatient Medications  Medication Sig Dispense Refill  . acetaminophen (TYLENOL) 500 MG tablet Take 500 mg by mouth every 8 (eight) hours as needed for moderate pain.    Marland Kitchen gabapentin (NEURONTIN) 100 MG capsule Take 1 capsule by mouth at bedtime 30 capsule 0  . HYDROcodone-acetaminophen (NORCO/VICODIN) 5-325 MG tablet Take 1 tablet by mouth every 4 (four) hours as needed for moderate pain. 20 tablet 0  . losartan-hydrochlorothiazide (HYZAAR) 50-12.5 MG tablet Take 1 tablet by mouth daily. 90 tablet 3  . lovastatin (MEVACOR) 40 MG tablet TAKE 1 TABLET BY MOUTH AT BEDTIME (Patient taking differently: Take 40 mg by mouth at bedtime.) 90 tablet 0  . metFORMIN (GLUCOPHAGE) 500 MG tablet TAKE 2 TABLETS BY MOUTH TWICE DAILY WITH A MEAL 120 tablet 0  . Multiple Vitamin (MULTIVITAMIN) tablet Take 1 tablet by mouth daily.    Marland Kitchen omeprazole (PRILOSEC) 20 MG capsule Take 1 capsule by mouth once daily 90 capsule 0   No current facility-administered medications for this encounter.    ECOG PERFORMANCE STATUS:  0 - Asymptomatic  REVIEW OF SYSTEMS: Patient denies any weight loss, fatigue, weakness, fever, chills or night sweats. Patient denies any loss of vision, blurred vision. Patient denies any ringing  of the ears or hearing loss. No irregular heartbeat. Patient denies heart murmur or history of fainting. Patient denies any chest pain or pain radiating to her upper extremities. Patient denies any shortness of breath, difficulty breathing at night, cough or hemoptysis. Patient denies any swelling in the lower legs. Patient  denies any nausea vomiting, vomiting of blood, or coffee ground material in the vomitus. Patient denies any stomach pain. Patient states has had normal bowel movements no significant constipation or diarrhea. Patient denies any dysuria, hematuria or significant nocturia. Patient denies any problems walking, swelling in the joints or loss of balance. Patient denies any skin changes, loss of hair or loss of weight. Patient denies any excessive worrying or anxiety or significant depression. Patient denies any problems with insomnia. Patient denies excessive thirst, polyuria, polydipsia. Patient denies any swollen glands, patient denies easy bruising or easy bleeding. Patient denies any recent infections, allergies or URI. Patient "s visual fields have not changed significantly in recent time.   PHYSICAL EXAM: BP (!) 161/89   Pulse 91   Temp (!) 97.5 F (36.4 C) (Tympanic)   Wt 189 lb 8 oz (86 kg)   BMI 29.68 kg/m  She is status post right wide local excision and axillary lymph node dissection both incisions are healing well.  The dominant masses noted in either breast.  No axillary or supraclavicular adenopathy is identified.  No lymphedema of the right upper extremity is noted.  Well-developed well-nourished patient in NAD. HEENT reveals PERLA, EOMI, discs not visualized.  Oral cavity is clear. No oral mucosal lesions are identified. Neck is clear without evidence of cervical or supraclavicular adenopathy. Lungs are clear to  A&P. Cardiac examination is essentially unremarkable with regular rate and rhythm without murmur rub or thrill. Abdomen is benign with no organomegaly or masses noted. Motor sensory and DTR levels are equal and symmetric in the upper and lower extremities. Cranial nerves II through XII are grossly intact. Proprioception is intact. No peripheral adenopathy or edema is identified. No motor or sensory levels are noted. Crude visual fields are within normal range.  LABORATORY DATA:  Pathology report reviewed    RADIOLOGY RESULTS: PET scan mammograms ultrasound all reviewed compatible with above-stated findings   IMPRESSION: Stage IIb triple negative invasive mammary carcinoma right breast status post neoadjuvant chemotherapy followed by wide local excision and axillary lymph node dissection in 70 year old female.  Patient had excellent response to neoadjuvant chemotherapy  PLAN: At this time I have recommended adjuvant radiation therapy to her right breast and peripheral lymphatics.  We will treat both areas to [place] in 28 fractions.  Would also boost her scar based on the close 1 mm margin another 1600 cGy using electron beam.  Risks and benefits of treatment including skin reaction fatigue alteration blood counts possible inclusion of superficial lung slight chance of lymphedema in her right upper extremity.  I have advised her to continue exercising her right arm is much as possible to avoid lymphedema.  I have personally set up and ordered CT simulation for next week.  Patient husband both comprehend my treatment plan well.  I would like to take this opportunity to thank you for allowing me to participate in the care of your patient.Noreene Filbert, MD

## 2020-05-20 ENCOUNTER — Ambulatory Visit
Admission: RE | Admit: 2020-05-20 | Discharge: 2020-05-20 | Disposition: A | Discharge status: Home / Self Care | Payer: PPO | Source: Ambulatory Visit | Attending: Radiation Oncology | Admitting: Radiation Oncology

## 2020-05-20 DIAGNOSIS — C50211 Malignant neoplasm of upper-inner quadrant of right female breast: Secondary | ICD-10-CM | POA: Diagnosis not present

## 2020-05-20 DIAGNOSIS — Z171 Estrogen receptor negative status [ER-]: Secondary | ICD-10-CM | POA: Diagnosis not present

## 2020-05-21 ENCOUNTER — Other Ambulatory Visit: Payer: Self-pay | Admitting: *Deleted

## 2020-05-21 DIAGNOSIS — C50211 Malignant neoplasm of upper-inner quadrant of right female breast: Secondary | ICD-10-CM

## 2020-05-21 DIAGNOSIS — Z171 Estrogen receptor negative status [ER-]: Secondary | ICD-10-CM

## 2020-05-24 DIAGNOSIS — C50211 Malignant neoplasm of upper-inner quadrant of right female breast: Secondary | ICD-10-CM | POA: Diagnosis not present

## 2020-05-24 DIAGNOSIS — Z171 Estrogen receptor negative status [ER-]: Secondary | ICD-10-CM | POA: Diagnosis not present

## 2020-05-27 ENCOUNTER — Ambulatory Visit: Admission: RE | Admit: 2020-05-27 | Payer: PPO | Source: Ambulatory Visit

## 2020-05-27 DIAGNOSIS — C50211 Malignant neoplasm of upper-inner quadrant of right female breast: Secondary | ICD-10-CM | POA: Diagnosis not present

## 2020-05-27 DIAGNOSIS — Z171 Estrogen receptor negative status [ER-]: Secondary | ICD-10-CM | POA: Diagnosis not present

## 2020-05-31 ENCOUNTER — Ambulatory Visit
Admission: RE | Admit: 2020-05-31 | Discharge: 2020-05-31 | Disposition: A | Discharge status: Home / Self Care | Payer: PPO | Source: Ambulatory Visit | Attending: Radiation Oncology | Admitting: Radiation Oncology

## 2020-05-31 DIAGNOSIS — C50211 Malignant neoplasm of upper-inner quadrant of right female breast: Secondary | ICD-10-CM | POA: Diagnosis not present

## 2020-05-31 DIAGNOSIS — Z171 Estrogen receptor negative status [ER-]: Secondary | ICD-10-CM | POA: Diagnosis not present

## 2020-06-01 ENCOUNTER — Ambulatory Visit
Admission: RE | Admit: 2020-06-01 | Discharge: 2020-06-01 | Disposition: A | Discharge status: Home / Self Care | Payer: PPO | Source: Ambulatory Visit | Attending: Radiation Oncology | Admitting: Radiation Oncology

## 2020-06-01 DIAGNOSIS — Z171 Estrogen receptor negative status [ER-]: Secondary | ICD-10-CM | POA: Diagnosis not present

## 2020-06-01 DIAGNOSIS — C50211 Malignant neoplasm of upper-inner quadrant of right female breast: Secondary | ICD-10-CM | POA: Diagnosis not present

## 2020-06-02 ENCOUNTER — Ambulatory Visit
Admission: RE | Admit: 2020-06-02 | Discharge: 2020-06-02 | Disposition: A | Discharge status: Home / Self Care | Payer: PPO | Source: Ambulatory Visit | Attending: Radiation Oncology | Admitting: Radiation Oncology

## 2020-06-02 DIAGNOSIS — C50211 Malignant neoplasm of upper-inner quadrant of right female breast: Secondary | ICD-10-CM | POA: Diagnosis not present

## 2020-06-02 DIAGNOSIS — Z171 Estrogen receptor negative status [ER-]: Secondary | ICD-10-CM | POA: Diagnosis not present

## 2020-06-03 ENCOUNTER — Ambulatory Visit
Admission: RE | Admit: 2020-06-03 | Discharge: 2020-06-03 | Disposition: A | Discharge status: Home / Self Care | Payer: PPO | Source: Ambulatory Visit | Attending: Radiation Oncology | Admitting: Radiation Oncology

## 2020-06-03 DIAGNOSIS — Z171 Estrogen receptor negative status [ER-]: Secondary | ICD-10-CM | POA: Diagnosis not present

## 2020-06-03 DIAGNOSIS — C50211 Malignant neoplasm of upper-inner quadrant of right female breast: Secondary | ICD-10-CM | POA: Diagnosis not present

## 2020-06-04 ENCOUNTER — Ambulatory Visit
Admission: RE | Admit: 2020-06-04 | Discharge: 2020-06-04 | Disposition: A | Discharge status: Home / Self Care | Payer: PPO | Source: Ambulatory Visit | Attending: Radiation Oncology | Admitting: Radiation Oncology

## 2020-06-04 DIAGNOSIS — Z171 Estrogen receptor negative status [ER-]: Secondary | ICD-10-CM | POA: Diagnosis not present

## 2020-06-04 DIAGNOSIS — C50211 Malignant neoplasm of upper-inner quadrant of right female breast: Secondary | ICD-10-CM | POA: Diagnosis not present

## 2020-06-05 ENCOUNTER — Other Ambulatory Visit: Payer: Self-pay | Admitting: Internal Medicine

## 2020-06-07 ENCOUNTER — Ambulatory Visit
Admission: RE | Admit: 2020-06-07 | Discharge: 2020-06-07 | Disposition: A | Discharge status: Home / Self Care | Payer: PPO | Source: Ambulatory Visit | Attending: Radiation Oncology | Admitting: Radiation Oncology

## 2020-06-07 ENCOUNTER — Encounter: Payer: Self-pay | Admitting: *Deleted

## 2020-06-07 DIAGNOSIS — Z171 Estrogen receptor negative status [ER-]: Secondary | ICD-10-CM | POA: Diagnosis not present

## 2020-06-07 DIAGNOSIS — C50211 Malignant neoplasm of upper-inner quadrant of right female breast: Secondary | ICD-10-CM | POA: Diagnosis not present

## 2020-06-08 ENCOUNTER — Other Ambulatory Visit: Payer: Self-pay | Admitting: Oncology

## 2020-06-08 ENCOUNTER — Ambulatory Visit
Admission: RE | Admit: 2020-06-08 | Discharge: 2020-06-08 | Disposition: A | Discharge status: Home / Self Care | Payer: PPO | Source: Ambulatory Visit | Attending: Radiation Oncology | Admitting: Radiation Oncology

## 2020-06-08 DIAGNOSIS — C50211 Malignant neoplasm of upper-inner quadrant of right female breast: Secondary | ICD-10-CM | POA: Diagnosis not present

## 2020-06-08 DIAGNOSIS — Z171 Estrogen receptor negative status [ER-]: Secondary | ICD-10-CM | POA: Diagnosis not present

## 2020-06-09 ENCOUNTER — Ambulatory Visit
Admission: RE | Admit: 2020-06-09 | Discharge: 2020-06-09 | Disposition: A | Discharge status: Home / Self Care | Payer: PPO | Source: Ambulatory Visit | Attending: Radiation Oncology | Admitting: Radiation Oncology

## 2020-06-09 DIAGNOSIS — C50211 Malignant neoplasm of upper-inner quadrant of right female breast: Secondary | ICD-10-CM | POA: Diagnosis not present

## 2020-06-10 ENCOUNTER — Other Ambulatory Visit: Payer: Self-pay

## 2020-06-10 ENCOUNTER — Inpatient Hospital Stay: Payer: PPO | Attending: Internal Medicine

## 2020-06-10 ENCOUNTER — Ambulatory Visit
Admission: RE | Admit: 2020-06-10 | Discharge: 2020-06-10 | Disposition: A | Discharge status: Home / Self Care | Payer: PPO | Source: Ambulatory Visit | Attending: Radiation Oncology | Admitting: Radiation Oncology

## 2020-06-10 DIAGNOSIS — Z171 Estrogen receptor negative status [ER-]: Secondary | ICD-10-CM

## 2020-06-10 DIAGNOSIS — C50211 Malignant neoplasm of upper-inner quadrant of right female breast: Secondary | ICD-10-CM | POA: Diagnosis not present

## 2020-06-10 LAB — CBC
HCT: 38.5 % (ref 36.0–46.0)
Hemoglobin: 12.3 g/dL (ref 12.0–15.0)
MCH: 29 pg (ref 26.0–34.0)
MCHC: 31.9 g/dL (ref 30.0–36.0)
MCV: 90.8 fL (ref 80.0–100.0)
Platelets: 200 10*3/uL (ref 150–400)
RBC: 4.24 MIL/uL (ref 3.87–5.11)
RDW: 13.5 % (ref 11.5–15.5)
WBC: 5 10*3/uL (ref 4.0–10.5)
nRBC: 0 % (ref 0.0–0.2)

## 2020-06-11 ENCOUNTER — Ambulatory Visit
Admission: RE | Admit: 2020-06-11 | Discharge: 2020-06-11 | Disposition: A | Discharge status: Home / Self Care | Payer: PPO | Source: Ambulatory Visit | Attending: Radiation Oncology | Admitting: Radiation Oncology

## 2020-06-11 DIAGNOSIS — C50211 Malignant neoplasm of upper-inner quadrant of right female breast: Secondary | ICD-10-CM | POA: Diagnosis not present

## 2020-06-11 DIAGNOSIS — Z171 Estrogen receptor negative status [ER-]: Secondary | ICD-10-CM | POA: Diagnosis not present

## 2020-06-14 ENCOUNTER — Ambulatory Visit
Admission: RE | Admit: 2020-06-14 | Discharge: 2020-06-14 | Disposition: A | Discharge status: Home / Self Care | Payer: PPO | Source: Ambulatory Visit | Attending: Radiation Oncology | Admitting: Radiation Oncology

## 2020-06-14 ENCOUNTER — Other Ambulatory Visit: Payer: Self-pay | Admitting: Oncology

## 2020-06-14 DIAGNOSIS — Z171 Estrogen receptor negative status [ER-]: Secondary | ICD-10-CM | POA: Diagnosis not present

## 2020-06-14 DIAGNOSIS — C50211 Malignant neoplasm of upper-inner quadrant of right female breast: Secondary | ICD-10-CM | POA: Diagnosis not present

## 2020-06-15 ENCOUNTER — Ambulatory Visit
Admission: RE | Admit: 2020-06-15 | Discharge: 2020-06-15 | Disposition: A | Discharge status: Home / Self Care | Payer: PPO | Source: Ambulatory Visit | Attending: Radiation Oncology | Admitting: Radiation Oncology

## 2020-06-15 DIAGNOSIS — C50211 Malignant neoplasm of upper-inner quadrant of right female breast: Secondary | ICD-10-CM | POA: Diagnosis not present

## 2020-06-15 DIAGNOSIS — Z171 Estrogen receptor negative status [ER-]: Secondary | ICD-10-CM | POA: Diagnosis not present

## 2020-06-16 ENCOUNTER — Ambulatory Visit
Admission: RE | Admit: 2020-06-16 | Discharge: 2020-06-16 | Disposition: A | Discharge status: Home / Self Care | Payer: PPO | Source: Ambulatory Visit | Attending: Radiation Oncology | Admitting: Radiation Oncology

## 2020-06-16 DIAGNOSIS — Z171 Estrogen receptor negative status [ER-]: Secondary | ICD-10-CM | POA: Diagnosis not present

## 2020-06-16 DIAGNOSIS — C50211 Malignant neoplasm of upper-inner quadrant of right female breast: Secondary | ICD-10-CM | POA: Diagnosis not present

## 2020-06-17 ENCOUNTER — Telehealth: Payer: Self-pay | Admitting: *Deleted

## 2020-06-17 ENCOUNTER — Ambulatory Visit
Admission: RE | Admit: 2020-06-17 | Discharge: 2020-06-17 | Disposition: A | Discharge status: Home / Self Care | Payer: PPO | Source: Ambulatory Visit | Attending: Radiation Oncology | Admitting: Radiation Oncology

## 2020-06-17 DIAGNOSIS — C50211 Malignant neoplasm of upper-inner quadrant of right female breast: Secondary | ICD-10-CM | POA: Diagnosis not present

## 2020-06-17 DIAGNOSIS — Z171 Estrogen receptor negative status [ER-]: Secondary | ICD-10-CM | POA: Diagnosis not present

## 2020-06-17 DIAGNOSIS — G620 Drug-induced polyneuropathy: Secondary | ICD-10-CM

## 2020-06-17 MED ORDER — GABAPENTIN 100 MG PO CAPS: 100.0000 mg | ORAL_CAPSULE | Freq: Every day | ORAL | 6 refills | Status: DC

## 2020-06-17 NOTE — Telephone Encounter (Signed)
INCOMING request from pt's pharmacy for RF- gabapentin

## 2020-06-18 ENCOUNTER — Ambulatory Visit
Admission: RE | Admit: 2020-06-18 | Discharge: 2020-06-18 | Disposition: A | Discharge status: Home / Self Care | Payer: PPO | Source: Ambulatory Visit | Attending: Radiation Oncology | Admitting: Radiation Oncology

## 2020-06-18 DIAGNOSIS — C50211 Malignant neoplasm of upper-inner quadrant of right female breast: Secondary | ICD-10-CM | POA: Diagnosis not present

## 2020-06-18 DIAGNOSIS — Z171 Estrogen receptor negative status [ER-]: Secondary | ICD-10-CM | POA: Diagnosis not present

## 2020-06-21 ENCOUNTER — Ambulatory Visit
Admission: RE | Admit: 2020-06-21 | Discharge: 2020-06-21 | Disposition: A | Discharge status: Home / Self Care | Payer: PPO | Source: Ambulatory Visit | Attending: Radiation Oncology | Admitting: Radiation Oncology

## 2020-06-21 DIAGNOSIS — C50211 Malignant neoplasm of upper-inner quadrant of right female breast: Secondary | ICD-10-CM | POA: Diagnosis not present

## 2020-06-21 DIAGNOSIS — Z171 Estrogen receptor negative status [ER-]: Secondary | ICD-10-CM | POA: Diagnosis not present

## 2020-06-22 ENCOUNTER — Ambulatory Visit
Admission: RE | Admit: 2020-06-22 | Discharge: 2020-06-22 | Disposition: A | Discharge status: Home / Self Care | Payer: PPO | Source: Ambulatory Visit | Attending: Radiation Oncology | Admitting: Radiation Oncology

## 2020-06-22 DIAGNOSIS — C50211 Malignant neoplasm of upper-inner quadrant of right female breast: Secondary | ICD-10-CM | POA: Diagnosis not present

## 2020-06-22 DIAGNOSIS — Z171 Estrogen receptor negative status [ER-]: Secondary | ICD-10-CM | POA: Diagnosis not present

## 2020-06-23 ENCOUNTER — Ambulatory Visit
Admission: RE | Admit: 2020-06-23 | Discharge: 2020-06-23 | Disposition: A | Discharge status: Home / Self Care | Payer: PPO | Source: Ambulatory Visit | Attending: Radiation Oncology | Admitting: Radiation Oncology

## 2020-06-23 DIAGNOSIS — Z171 Estrogen receptor negative status [ER-]: Secondary | ICD-10-CM | POA: Diagnosis not present

## 2020-06-23 DIAGNOSIS — C50211 Malignant neoplasm of upper-inner quadrant of right female breast: Secondary | ICD-10-CM | POA: Diagnosis not present

## 2020-06-24 ENCOUNTER — Inpatient Hospital Stay: Payer: PPO

## 2020-06-24 ENCOUNTER — Ambulatory Visit
Admission: RE | Admit: 2020-06-24 | Discharge: 2020-06-24 | Disposition: A | Discharge status: Home / Self Care | Payer: PPO | Source: Ambulatory Visit | Attending: Radiation Oncology | Admitting: Radiation Oncology

## 2020-06-24 DIAGNOSIS — Z171 Estrogen receptor negative status [ER-]: Secondary | ICD-10-CM | POA: Diagnosis not present

## 2020-06-24 DIAGNOSIS — C50211 Malignant neoplasm of upper-inner quadrant of right female breast: Secondary | ICD-10-CM | POA: Diagnosis not present

## 2020-06-24 LAB — CBC
HCT: 38.2 % (ref 36.0–46.0)
Hemoglobin: 12.5 g/dL (ref 12.0–15.0)
MCH: 29.1 pg (ref 26.0–34.0)
MCHC: 32.7 g/dL (ref 30.0–36.0)
MCV: 88.8 fL (ref 80.0–100.0)
Platelets: 154 10*3/uL (ref 150–400)
RBC: 4.3 MIL/uL (ref 3.87–5.11)
RDW: 13.6 % (ref 11.5–15.5)
WBC: 4 10*3/uL (ref 4.0–10.5)
nRBC: 0 % (ref 0.0–0.2)

## 2020-06-25 ENCOUNTER — Ambulatory Visit
Admission: RE | Admit: 2020-06-25 | Discharge: 2020-06-25 | Disposition: A | Discharge status: Home / Self Care | Payer: PPO | Source: Ambulatory Visit | Attending: Radiation Oncology | Admitting: Radiation Oncology

## 2020-06-25 DIAGNOSIS — C50211 Malignant neoplasm of upper-inner quadrant of right female breast: Secondary | ICD-10-CM | POA: Diagnosis not present

## 2020-06-25 DIAGNOSIS — Z171 Estrogen receptor negative status [ER-]: Secondary | ICD-10-CM | POA: Diagnosis not present

## 2020-06-28 ENCOUNTER — Ambulatory Visit
Admission: RE | Admit: 2020-06-28 | Discharge: 2020-06-28 | Disposition: A | Discharge status: Home / Self Care | Payer: PPO | Source: Ambulatory Visit | Attending: Radiation Oncology | Admitting: Radiation Oncology

## 2020-06-28 DIAGNOSIS — Z171 Estrogen receptor negative status [ER-]: Secondary | ICD-10-CM | POA: Diagnosis not present

## 2020-06-28 DIAGNOSIS — C50211 Malignant neoplasm of upper-inner quadrant of right female breast: Secondary | ICD-10-CM | POA: Diagnosis not present

## 2020-06-29 ENCOUNTER — Ambulatory Visit
Admission: RE | Admit: 2020-06-29 | Discharge: 2020-06-29 | Disposition: A | Discharge status: Home / Self Care | Payer: PPO | Source: Ambulatory Visit | Attending: Radiation Oncology | Admitting: Radiation Oncology

## 2020-06-29 DIAGNOSIS — Z171 Estrogen receptor negative status [ER-]: Secondary | ICD-10-CM | POA: Diagnosis not present

## 2020-06-29 DIAGNOSIS — C50211 Malignant neoplasm of upper-inner quadrant of right female breast: Secondary | ICD-10-CM | POA: Diagnosis not present

## 2020-06-30 ENCOUNTER — Ambulatory Visit
Admission: RE | Admit: 2020-06-30 | Discharge: 2020-06-30 | Disposition: A | Discharge status: Home / Self Care | Payer: PPO | Source: Ambulatory Visit | Attending: Radiation Oncology | Admitting: Radiation Oncology

## 2020-06-30 DIAGNOSIS — C50211 Malignant neoplasm of upper-inner quadrant of right female breast: Secondary | ICD-10-CM | POA: Diagnosis not present

## 2020-06-30 DIAGNOSIS — Z171 Estrogen receptor negative status [ER-]: Secondary | ICD-10-CM | POA: Diagnosis not present

## 2020-07-01 ENCOUNTER — Ambulatory Visit
Admission: RE | Admit: 2020-07-01 | Discharge: 2020-07-01 | Disposition: A | Discharge status: Home / Self Care | Payer: PPO | Source: Ambulatory Visit | Attending: Radiation Oncology | Admitting: Radiation Oncology

## 2020-07-01 DIAGNOSIS — Z171 Estrogen receptor negative status [ER-]: Secondary | ICD-10-CM | POA: Diagnosis not present

## 2020-07-01 DIAGNOSIS — C50211 Malignant neoplasm of upper-inner quadrant of right female breast: Secondary | ICD-10-CM | POA: Diagnosis not present

## 2020-07-02 ENCOUNTER — Ambulatory Visit
Admission: RE | Admit: 2020-07-02 | Discharge: 2020-07-02 | Disposition: A | Discharge status: Home / Self Care | Payer: PPO | Source: Ambulatory Visit | Attending: Radiation Oncology | Admitting: Radiation Oncology

## 2020-07-02 DIAGNOSIS — C50211 Malignant neoplasm of upper-inner quadrant of right female breast: Secondary | ICD-10-CM | POA: Diagnosis not present

## 2020-07-02 DIAGNOSIS — Z171 Estrogen receptor negative status [ER-]: Secondary | ICD-10-CM | POA: Diagnosis not present

## 2020-07-05 ENCOUNTER — Other Ambulatory Visit: Payer: Self-pay | Admitting: Internal Medicine

## 2020-07-05 ENCOUNTER — Ambulatory Visit
Admission: RE | Admit: 2020-07-05 | Discharge: 2020-07-05 | Disposition: A | Discharge status: Home / Self Care | Payer: PPO | Source: Ambulatory Visit | Attending: Radiation Oncology | Admitting: Radiation Oncology

## 2020-07-05 DIAGNOSIS — C50211 Malignant neoplasm of upper-inner quadrant of right female breast: Secondary | ICD-10-CM | POA: Diagnosis not present

## 2020-07-05 DIAGNOSIS — Z171 Estrogen receptor negative status [ER-]: Secondary | ICD-10-CM | POA: Diagnosis not present

## 2020-07-06 ENCOUNTER — Ambulatory Visit
Admission: RE | Admit: 2020-07-06 | Discharge: 2020-07-06 | Disposition: A | Discharge status: Home / Self Care | Payer: PPO | Source: Ambulatory Visit | Attending: Radiation Oncology | Admitting: Radiation Oncology

## 2020-07-06 DIAGNOSIS — Z171 Estrogen receptor negative status [ER-]: Secondary | ICD-10-CM | POA: Diagnosis not present

## 2020-07-06 DIAGNOSIS — C50211 Malignant neoplasm of upper-inner quadrant of right female breast: Secondary | ICD-10-CM | POA: Diagnosis not present

## 2020-07-07 ENCOUNTER — Ambulatory Visit
Admission: RE | Admit: 2020-07-07 | Discharge: 2020-07-07 | Disposition: A | Discharge status: Home / Self Care | Payer: PPO | Source: Ambulatory Visit | Attending: Radiation Oncology | Admitting: Radiation Oncology

## 2020-07-07 DIAGNOSIS — Z171 Estrogen receptor negative status [ER-]: Secondary | ICD-10-CM | POA: Diagnosis not present

## 2020-07-07 DIAGNOSIS — C50211 Malignant neoplasm of upper-inner quadrant of right female breast: Secondary | ICD-10-CM | POA: Diagnosis not present

## 2020-07-08 ENCOUNTER — Ambulatory Visit
Admission: RE | Admit: 2020-07-08 | Discharge: 2020-07-08 | Disposition: A | Discharge status: Home / Self Care | Payer: PPO | Source: Ambulatory Visit | Attending: Radiation Oncology | Admitting: Radiation Oncology

## 2020-07-08 DIAGNOSIS — C50211 Malignant neoplasm of upper-inner quadrant of right female breast: Secondary | ICD-10-CM | POA: Diagnosis not present

## 2020-07-09 ENCOUNTER — Other Ambulatory Visit: Payer: PPO

## 2020-07-09 ENCOUNTER — Ambulatory Visit
Admission: RE | Admit: 2020-07-09 | Discharge: 2020-07-09 | Disposition: A | Discharge status: Home / Self Care | Payer: PPO | Source: Ambulatory Visit | Attending: Radiation Oncology | Admitting: Radiation Oncology

## 2020-07-09 ENCOUNTER — Ambulatory Visit: Payer: PPO | Admitting: Internal Medicine

## 2020-07-09 DIAGNOSIS — Z171 Estrogen receptor negative status [ER-]: Secondary | ICD-10-CM | POA: Diagnosis not present

## 2020-07-09 DIAGNOSIS — C50211 Malignant neoplasm of upper-inner quadrant of right female breast: Secondary | ICD-10-CM | POA: Diagnosis not present

## 2020-07-12 ENCOUNTER — Ambulatory Visit
Admission: RE | Admit: 2020-07-12 | Discharge: 2020-07-12 | Disposition: A | Discharge status: Home / Self Care | Payer: PPO | Source: Ambulatory Visit | Attending: Radiation Oncology | Admitting: Radiation Oncology

## 2020-07-12 DIAGNOSIS — C50211 Malignant neoplasm of upper-inner quadrant of right female breast: Secondary | ICD-10-CM | POA: Diagnosis not present

## 2020-07-13 ENCOUNTER — Ambulatory Visit
Admission: RE | Admit: 2020-07-13 | Discharge: 2020-07-13 | Disposition: A | Discharge status: Home / Self Care | Payer: PPO | Source: Ambulatory Visit | Attending: Radiation Oncology | Admitting: Radiation Oncology

## 2020-07-13 DIAGNOSIS — C50211 Malignant neoplasm of upper-inner quadrant of right female breast: Secondary | ICD-10-CM | POA: Diagnosis not present

## 2020-07-14 ENCOUNTER — Ambulatory Visit
Admission: RE | Admit: 2020-07-14 | Discharge: 2020-07-14 | Disposition: A | Discharge status: Home / Self Care | Payer: PPO | Source: Ambulatory Visit | Attending: Radiation Oncology | Admitting: Radiation Oncology

## 2020-07-14 DIAGNOSIS — C50211 Malignant neoplasm of upper-inner quadrant of right female breast: Secondary | ICD-10-CM | POA: Diagnosis not present

## 2020-07-15 ENCOUNTER — Ambulatory Visit
Admission: RE | Admit: 2020-07-15 | Discharge: 2020-07-15 | Disposition: A | Discharge status: Home / Self Care | Payer: PPO | Source: Ambulatory Visit | Attending: Radiation Oncology | Admitting: Radiation Oncology

## 2020-07-15 DIAGNOSIS — C50211 Malignant neoplasm of upper-inner quadrant of right female breast: Secondary | ICD-10-CM | POA: Diagnosis not present

## 2020-07-16 ENCOUNTER — Ambulatory Visit
Admission: RE | Admit: 2020-07-16 | Discharge: 2020-07-16 | Disposition: A | Discharge status: Home / Self Care | Payer: PPO | Source: Ambulatory Visit | Attending: Radiation Oncology | Admitting: Radiation Oncology

## 2020-07-16 DIAGNOSIS — Z171 Estrogen receptor negative status [ER-]: Secondary | ICD-10-CM | POA: Diagnosis not present

## 2020-07-16 DIAGNOSIS — C50211 Malignant neoplasm of upper-inner quadrant of right female breast: Secondary | ICD-10-CM | POA: Diagnosis not present

## 2020-07-17 ENCOUNTER — Other Ambulatory Visit: Payer: Self-pay | Admitting: Internal Medicine

## 2020-07-19 ENCOUNTER — Ambulatory Visit
Admission: RE | Admit: 2020-07-19 | Discharge: 2020-07-19 | Disposition: A | Discharge status: Home / Self Care | Payer: PPO | Source: Ambulatory Visit | Attending: Radiation Oncology | Admitting: Radiation Oncology

## 2020-07-19 DIAGNOSIS — C50211 Malignant neoplasm of upper-inner quadrant of right female breast: Secondary | ICD-10-CM | POA: Diagnosis not present

## 2020-07-20 ENCOUNTER — Other Ambulatory Visit: Payer: PPO

## 2020-07-20 ENCOUNTER — Ambulatory Visit: Payer: PPO | Admitting: Internal Medicine

## 2020-07-26 ENCOUNTER — Telehealth: Payer: Self-pay | Admitting: Pharmacy Technician

## 2020-07-26 ENCOUNTER — Inpatient Hospital Stay: Payer: PPO | Attending: Internal Medicine

## 2020-07-26 ENCOUNTER — Other Ambulatory Visit (HOSPITAL_COMMUNITY): Payer: Self-pay

## 2020-07-26 ENCOUNTER — Inpatient Hospital Stay: Payer: PPO | Admitting: Pharmacist

## 2020-07-26 ENCOUNTER — Other Ambulatory Visit: Payer: Self-pay

## 2020-07-26 ENCOUNTER — Inpatient Hospital Stay: Payer: PPO | Admitting: Internal Medicine

## 2020-07-26 ENCOUNTER — Encounter: Payer: Self-pay | Admitting: Internal Medicine

## 2020-07-26 VITALS — BP 139/71 | HR 97 | Temp 98.1°F | Resp 20 | Ht 67.0 in | Wt 191.2 lb

## 2020-07-26 DIAGNOSIS — Z7984 Long term (current) use of oral hypoglycemic drugs: Secondary | ICD-10-CM | POA: Insufficient documentation

## 2020-07-26 DIAGNOSIS — Z9221 Personal history of antineoplastic chemotherapy: Secondary | ICD-10-CM | POA: Diagnosis not present

## 2020-07-26 DIAGNOSIS — Z833 Family history of diabetes mellitus: Secondary | ICD-10-CM | POA: Insufficient documentation

## 2020-07-26 DIAGNOSIS — Z79899 Other long term (current) drug therapy: Secondary | ICD-10-CM | POA: Insufficient documentation

## 2020-07-26 DIAGNOSIS — I10 Essential (primary) hypertension: Secondary | ICD-10-CM | POA: Insufficient documentation

## 2020-07-26 DIAGNOSIS — E119 Type 2 diabetes mellitus without complications: Secondary | ICD-10-CM | POA: Insufficient documentation

## 2020-07-26 DIAGNOSIS — Z8041 Family history of malignant neoplasm of ovary: Secondary | ICD-10-CM | POA: Insufficient documentation

## 2020-07-26 DIAGNOSIS — Z8249 Family history of ischemic heart disease and other diseases of the circulatory system: Secondary | ICD-10-CM | POA: Diagnosis not present

## 2020-07-26 DIAGNOSIS — Z923 Personal history of irradiation: Secondary | ICD-10-CM | POA: Insufficient documentation

## 2020-07-26 DIAGNOSIS — Z171 Estrogen receptor negative status [ER-]: Secondary | ICD-10-CM | POA: Diagnosis not present

## 2020-07-26 DIAGNOSIS — Z95828 Presence of other vascular implants and grafts: Secondary | ICD-10-CM

## 2020-07-26 DIAGNOSIS — C50211 Malignant neoplasm of upper-inner quadrant of right female breast: Secondary | ICD-10-CM | POA: Diagnosis not present

## 2020-07-26 DIAGNOSIS — Z87891 Personal history of nicotine dependence: Secondary | ICD-10-CM | POA: Insufficient documentation

## 2020-07-26 DIAGNOSIS — E113559 Type 2 diabetes mellitus with stable proliferative diabetic retinopathy, unspecified eye: Secondary | ICD-10-CM

## 2020-07-26 LAB — CBC WITH DIFFERENTIAL/PLATELET
Abs Immature Granulocytes: 0.01 10*3/uL (ref 0.00–0.07)
Basophils Absolute: 0 10*3/uL (ref 0.0–0.1)
Basophils Relative: 1 %
Eosinophils Absolute: 0.2 10*3/uL (ref 0.0–0.5)
Eosinophils Relative: 3 %
HCT: 37.2 % (ref 36.0–46.0)
Hemoglobin: 12.2 g/dL (ref 12.0–15.0)
Immature Granulocytes: 0 %
Lymphocytes Relative: 19 %
Lymphs Abs: 0.9 10*3/uL (ref 0.7–4.0)
MCH: 28.2 pg (ref 26.0–34.0)
MCHC: 32.8 g/dL (ref 30.0–36.0)
MCV: 85.9 fL (ref 80.0–100.0)
Monocytes Absolute: 0.4 10*3/uL (ref 0.1–1.0)
Monocytes Relative: 9 %
Neutro Abs: 3.2 10*3/uL (ref 1.7–7.7)
Neutrophils Relative %: 68 %
Platelets: 166 10*3/uL (ref 150–400)
RBC: 4.33 MIL/uL (ref 3.87–5.11)
RDW: 14.8 % (ref 11.5–15.5)
WBC: 4.6 10*3/uL (ref 4.0–10.5)
nRBC: 0 % (ref 0.0–0.2)

## 2020-07-26 LAB — COMPREHENSIVE METABOLIC PANEL
ALT: 61 U/L — ABNORMAL HIGH (ref 0–44)
AST: 80 U/L — ABNORMAL HIGH (ref 15–41)
Albumin: 3.7 g/dL (ref 3.5–5.0)
Alkaline Phosphatase: 97 U/L (ref 38–126)
Anion gap: 15 (ref 5–15)
BUN: 16 mg/dL (ref 8–23)
CO2: 23 mmol/L (ref 22–32)
Calcium: 9.6 mg/dL (ref 8.9–10.3)
Chloride: 100 mmol/L (ref 98–111)
Creatinine, Ser: 0.78 mg/dL (ref 0.44–1.00)
GFR, Estimated: >60 mL/min (ref >60)
Glucose, Bld: 225 mg/dL — ABNORMAL HIGH (ref 70–99)
Potassium: 3.4 mmol/L — ABNORMAL LOW (ref 3.5–5.1)
Sodium: 138 mmol/L (ref 135–145)
Total Bilirubin: 0.7 mg/dL (ref 0.3–1.2)
Total Protein: 7.2 g/dL (ref 6.5–8.1)

## 2020-07-26 MED ORDER — HEPARIN SOD (PORK) LOCK FLUSH 100 UNIT/ML IV SOLN
500.0000 [IU] | Freq: Once | INTRAVENOUS | Status: AC
  Administered 2020-07-26: 500 [IU] via INTRAVENOUS
  Filled 2020-07-26: qty 5

## 2020-07-26 MED ORDER — SODIUM CHLORIDE 0.9% FLUSH
10.0000 mL | Freq: Once | INTRAVENOUS | Status: AC
  Administered 2020-07-26: 10 mL via INTRAVENOUS
  Filled 2020-07-26: qty 10

## 2020-07-26 MED ORDER — METFORMIN HCL ER 500 MG PO TB24: 1000.0000 mg | ORAL_TABLET | Freq: Two times a day (BID) | ORAL | 0 refills | Status: DC

## 2020-07-26 MED ORDER — HEPARIN SOD (PORK) LOCK FLUSH 100 UNIT/ML IV SOLN
INTRAVENOUS | Status: AC
  Filled 2020-07-26: qty 5

## 2020-07-26 MED ORDER — CAPECITABINE 500 MG PO TABS: ORAL_TABLET | ORAL | 6 refills | Status: DC

## 2020-07-26 NOTE — Assessment & Plan Note (Addendum)
#   Right breast cancer-Stage IIB- T2N1-TRIPLE NEGATIVE;  s/p neoadjuvant therapy carbo platinum-Taxol; Adriamycin Cytoxan- ypT1ypN1.  Currently s/p adjuvant radiation.  #Recommend Xeloda 1000 mg per metered squared [twice daily; 2 weeks on 1 week off for 6-8 cycles]; given the incomplete pathologic response. Discussed potential side effects including but not limited to- hand-foot syndrome and diarrhea oral ulcers. Blood counts will be checked prior to each treatment cycle.  Discussed with pharmacy.  #Chronic mild diarrhea-question related to metformin short-acting; recently switched over to 1000 mg XR twice daily new prescription sent.  # PN-1-2 sec to taxol-on Neurontin 100 nightly stable  #Port malfunction-port flush every 2 to 3 months.  # DISPOSITION: # follow up in 1 months- MD; labs- cbc/cmp-Dr.

## 2020-07-26 NOTE — Telephone Encounter (Signed)
Oral Oncology Patient Advocate Encounter  Met patient after appointment today to complete application for [employer] Patient [employer] in an effort to reduce patient's out of pocket expense for Xeloda to $0.    Patient portion has been completed.  MD portion still needs to be signed.  Once signed, I will fax the completed application to [employer].   This encounter will be updated until final determination.     [facility] Patient Advocate [facility] Phone [phone] Fax [phone] 07/26/2020 11:59 AM      

## 2020-07-26 NOTE — Telephone Encounter (Signed)
Oral Oncology Patient Advocate Encounter  After completing a benefits investigation, prior authorization for Xeloda is not required at this time through [place].  Xeloda is billed through patients Part C Medicare.  Patient's copay is $462.13.  Spoke with patient at her appt this morning and will apply for [facility] Patient Assistance to obtain medication at no cost.  [facility] Patient [place] Phone [phone] Fax [phone] 07/26/2020 11:51 AM

## 2020-07-26 NOTE — Progress Notes (Signed)
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 Byrnett.] [id number] Carbo-taxol-AC;  JAN 2022-s/p Lumpectomy & ALND- [ypT1a (63m ) ypN1 (2/11 LN-positive)]; s/p radiation end of April 2022.  #Early June 2022-Xeloda 1000 mg per metered square 2 weeks on 1 week of 6-8 cycles ; # SURVIVORSHIP: Pending  # GENETICS: Pending  DIAGNOSIS: Right breast cancer triple negative  STAGE:   IIB      ;  GOALS: cure  CURRENT/MOST RECENT THERAPY : RT    Carcinoma of upper-inner quadrant of right breast in female, estrogen receptor negative ([facility]  10/07/2019 Initial Diagnosis   Carcinoma of upper-inner quadrant of right breast in female, estrogen receptor negative ([facility]   12/04/2019 Genetic Testing   Negative genetic testing. No pathogenic variants identified on the [employer] Common Hereditary Cancers Panel + Skin Cancers Panel. The report date is 12/04/2019.  The Common Hereditary Cancers Panel + Skin Cancers Panel offered by [facility] includes sequencing and/or deletion duplication testing of the following 54 genes: APC*, ATM*, AXIN2, BAP1, BARD1, BMPR1A, BRCA1, BRCA2, BRIP1, CDH1, CDK4, CDKN2A (p14ARF), CDKN2A (p16INK4a), CHEK2, CTNNA1, DICER1*, EPCAM*, GREM1*, HOXB13, KIT, MEN1*, MITF*, MLH1*, MSH2*, MSH3*, MSH6*, MUTYH, NBN, NF1*, NTHL1, PALB2, PDGFRA, PMS2*, POLD1*, POLE, POT1, PTCH1, PTEN*, RAD50, RAD51C, RAD51D, RB1*, RNF43, SDHA*, SDHB, SDHC*, SDHD, SMAD4, SMARCA4, STK11, SUFU, TP53, TSC1*, TSC2, VHL.    05/07/2020 Cancer Staging   Staging form: Breast, AJCC 8th Edition - Clinical: Stage IIIB (cT2, cN1, cM0, G3, ER-, PR-, HER2-) - Signed by BCammie Sickle MD on 05/07/2020      HISTORY OF PRESENTING ILLNESS:  Ashlee Cox 70y.o.  female patient with triple negative breast cancer stage IIb s/p adjuvant radiation is here for follow-up.  Patient finished radiation last week.  Recovered fairly well.  Denies any worsening skin rash or redness.  Chronic mild tingling and numbness in extremities using gabapentin.  No falls.    Review of Systems  Constitutional: Positive for malaise/fatigue. Negative for chills, diaphoresis, fever and weight loss.  HENT: Negative for nosebleeds and sore throat.   Eyes: Negative for double vision.  Respiratory: Negative for cough, hemoptysis, sputum production and wheezing.   Cardiovascular: Negative for chest pain, palpitations, orthopnea and leg swelling.  Gastrointestinal: Negative for abdominal pain, blood in stool, constipation and melena.  Genitourinary: Negative for dysuria, frequency and urgency.  Musculoskeletal: Negative for back pain and joint pain.  Skin: Negative.  Negative for itching and rash.  Neurological: Positive for tingling. Negative for dizziness, focal weakness, weakness and headaches.  Endo/Heme/Allergies: Does not bruise/bleed easily.  Psychiatric/Behavioral: Negative for depression. The patient is not nervous/anxious and does not have insomnia.      MEDICAL HISTORY:  Past Medical History:  Diagnosis Date  . Cancer ([facility] 2021   right breast  . Cyst, breast    benign  . Diabetes mellitus without complication ([facility]   . Family history of melanoma   . Family history of ovarian cancer   . GERD (gastroesophageal reflux disease)   . H/O: rheumatic fever   . 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 Hyperlipidemia   . Hypertension   . Menopause    at age 70 .  Personal history of chemotherapy 09/2019   RIGHT  INVASIVE MAMMARY CARCINOMA    SURGICAL HISTORY: Past Surgical History:  Procedure Laterality Date  . APPENDECTOMY  2006  . BREAST BIOPSY Right 09/22/2019   INVASIVE MAMMARY  CARCINOMA  . BREAST CYST ASPIRATION Left 1999  . BREAST LUMPECTOMY Left 04/23/2020   surgery with NL and SN   . BREAST LUMPECTOMY WITH NEEDLE LOCALIZATION Right 04/23/2020   Procedure: BREAST LUMPECTOMY WITH NEEDLE LOCALIZATION;  Surgeon: Robert Bellow, MD;  Location: [facility] ORS;  Service: General;  Laterality: Right;  . BREAST LUMPECTOMY WITH SENTINEL LYMPH NODE BIOPSY Right 04/23/2020   Procedure: BREAST LUMPECTOMY WITH SENTINEL LYMPH NODE BX;  Surgeon: Robert Bellow, MD;  Location: [facility] ORS;  Service: General;  Laterality: Right;  . CHOLECYSTECTOMY  2006  . COLONOSCOPY    . PORTACATH PLACEMENT Left 10/06/2019   Procedure: INSERTION PORT-A-CATH;  Surgeon: Robert Bellow, MD;  Location: [facility] ORS;  Service: General;  Laterality: Left;  . SUBMUCOSAL TATTOO INJECTION Right 10/06/2019   Procedure: Right axillary TATTOO INJECTION;  Surgeon: Robert Bellow, MD;  Location: [facility] ORS;  Service: General;  Laterality: Right;  Marland Kitchen VAGINAL DELIVERY     x3    SOCIAL HISTORY: Social History   Socioeconomic History  . Marital status: Married    Spouse name: Chrissie Noa  . Number of children: 3  . Years of education: Not on file  . Highest education level: Not on file  Occupational History  . Occupation: Glass blower/designer: [id number]  Tobacco Use  . Smoking status: Former Smoker    Years: 1.00    Types: Cigarettes    Quit date: 12/26/2005    Years since quitting: 14.5  . Smokeless tobacco: Never Used  . Tobacco comment: smoked for less than 1 years,  1 cig/day  Vaping Use  . Vaping Use: Never used  Substance and Sexual Activity  . Alcohol use: No  . Drug use: No  . Sexual activity: Never    Partners: Female  Other Topics Concern  . Not on file  Social History Narrative   Widowed, March 2014; remarried.      [employer]; quit smoking 1ppw; no alcohol.       Social Determinants of Health   Financial Resource Strain: Not on file  Food Insecurity: Not on file  Transportation  Needs: Not on file  Physical Activity: Not on file  Stress: Not on file  Social Connections: Not on file  Intimate Partner Violence: Not on file    FAMILY HISTORY: Family History  Problem Relation Age of Onset  . Cancer Mother 41       ovarian- died 4-5 years.   . Heart disease Mother 80  . Diabetes Mother   . Stroke Father 84  . Diabetes Father   . Diabetes Sister   . Melanoma Sister        survived  . Breast cancer Neg Hx     ALLERGIES:  has No Known Allergies.  MEDICATIONS:  Current Outpatient Medications  Medication Sig Dispense Refill  . Biotin 1000 MCG tablet Take 1,000 mcg by mouth daily.    . capecitabine (XELODA) 500 MG tablet Take 4 pills in AM: and 4 pills in PM- 2 weeks-ON and 1 week OFF 112 tablet 6  . gabapentin (NEURONTIN) 100 MG capsule Take 1 capsule (100 mg total) by mouth at bedtime. (Patient taking differently: Take 100 mg by mouth daily.) 30 capsule 6  .  HYDROcodone-acetaminophen (NORCO/VICODIN) 5-325 MG tablet Take 1 tablet by mouth every 4 (four) hours as needed for moderate pain. 20 tablet 0  . losartan-hydrochlorothiazide (HYZAAR) 50-12.5 MG tablet Take 1 tablet by mouth daily. 90 tablet 3  . lovastatin (MEVACOR) 40 MG tablet TAKE 1 TABLET BY MOUTH AT BEDTIME 90 tablet 0  . Multiple Vitamin (MULTIVITAMIN) tablet Take 1 tablet by mouth daily.    Marland Kitchen omeprazole (PRILOSEC) 20 MG capsule Take 1 capsule by mouth once daily 90 capsule 0  . acetaminophen (TYLENOL) 500 MG tablet Take 500 mg by mouth every 8 (eight) hours as needed for moderate pain. (Patient not taking: Reported on 07/26/2020)    . metFORMIN (GLUCOPHAGE-XR) 500 MG 24 hr tablet Take 2 tablets (1,000 mg total) by mouth 2 (two) times daily. 120 tablet 0   No current facility-administered medications for this visit.      Marland Kitchen  PHYSICAL EXAMINATION: ECOG PERFORMANCE STATUS: 0 - Asymptomatic  Vitals:   07/26/20 1006  BP: 139/71  Pulse: 97  Resp: 20  Temp: 98.1 F (36.7 C)   Filed Weights    07/26/20 1006  Weight: 191 lb 3.2 oz (86.7 kg)    Physical Exam Constitutional:      Comments: Ambulating independently.  Accompanied by her husband.  HENT:     Head: Normocephalic and atraumatic.     Mouth/Throat:     Pharynx: No oropharyngeal exudate.  Eyes:     Pupils: Pupils are equal, round, and reactive to light.  Cardiovascular:     Rate and Rhythm: Normal rate and regular rhythm.  Pulmonary:     Effort: Pulmonary effort is normal. No respiratory distress.     Breath sounds: Normal breath sounds. No wheezing.  Abdominal:     General: Bowel sounds are normal. There is no distension.     Palpations: Abdomen is soft. There is no mass.     Tenderness: There is no abdominal tenderness. There is no guarding or rebound.  Musculoskeletal:        General: No tenderness. Normal range of motion.     Cervical back: Normal range of motion and neck supple.  Skin:    General: Skin is warm.  Neurological:     Mental Status: She is alert and oriented to person, place, and time.  Psychiatric:        Mood and Affect: Affect normal.      LABORATORY DATA:  I have reviewed the data as listed Lab Results  Component Value Date   WBC 4.6 07/26/2020   HGB 12.2 07/26/2020   HCT 37.2 07/26/2020   MCV 85.9 07/26/2020   PLT 166 07/26/2020   Recent Labs    12/02/19 0922 12/09/19 0844 12/23/19 0846 12/30/19 0839 03/03/20 0911 03/24/20 0859 04/21/20 1010 07/26/20 0934  NA 144 140 140   < > 139 135 140 138  K 3.4* 3.3* 3.3*   < > 3.2* 3.4* 3.5 3.4*  CL 106 104 104   < > 100 100 104 100  CO2 _0 < > _1 GLUCOSE 194* 204* 237*   < > 184* 219* 168* 225*  BUN _2 < > _3 CREATININE 0.68 0.79 0.77   < > 0.89 0.63 0.69 0.78  CALCIUM 9.1 8.9 9.1   < > 8.8* 8.9 9.9 9.6  GFRNONAA >60 >60 >60   < > >60 >60 >60 >60  GFRAA >60 >60 >  60  --   --   --   --   --   PROT  --   --  6.7   < > 6.6 7.0  --  7.2  ALBUMIN  --   --  3.8   < > 3.9 3.9  --  3.7  AST  --    --  56*   < > 58* 67*  --  80*  ALT  --   --  43   < > 46* 47*  --  61*  ALKPHOS  --   --  66   < > 72 79  --  97  BILITOT  --   --  0.6   < > 0.6 0.8  --  0.7   < > = values in this interval not displayed.    RADIOGRAPHIC STUDIES: I have personally reviewed the radiological images as listed and agreed with the findings in the report. No results found.  ASSESSMENT & PLAN:   Carcinoma of upper-inner quadrant of right breast in female, estrogen receptor negative ([place]) # Right breast cancer-Stage IIB- T2N1-TRIPLE NEGATIVE;  s/p neoadjuvant therapy carbo platinum-Taxol; Adriamycin Cytoxan- ypT1ypN1.  Currently s/p adjuvant radiation.  #Recommend Xeloda 1000 mg per metered squared [twice daily; 2 weeks on 1 week off for 6-8 cycles]; given the incomplete pathologic response. Discussed potential side effects including but not limited to- hand-foot syndrome and diarrhea oral ulcers. Blood counts will be checked prior to each treatment cycle.  Discussed with pharmacy.  #Chronic mild diarrhea-question related to metformin short-acting; recently switched over to 1000 mg XR twice daily new prescription sent.  # PN-1-2 sec to taxol-on Neurontin 100 nightly stable  #Port malfunction-port flush every 2 to 3 months.  # DISPOSITION: # follow up in 1 months- MD; labs- cbc/cmp-Dr.     All questions were answered. The patient/family knows to call the clinic with any problems, questions or concerns.   Cammie Sickle, MD 07/26/2020 3:41 PM

## 2020-07-26 NOTE — Progress Notes (Signed)
[place]  Telephone:([phone] [phone]  Patient Care Team: [name], [name] Jeral Fruit, RN as Registered Nurse   Name of the patient: Ashlee Cox  [id number]  03-May-1950   Date of visit: 07/26/20  HPI: Patient is a 70 y.o. female with stage IIB triple negative breast cancer. Diagnosed in June 2021, she is s/p neoadjuvant carbo-taxol-AC, lumpectomy & ALND, and radiation. The plan is for her to new proceed with Xeloda (capecitabine) adjuvant treatment.  Reason for Consult: Xeloda (capecitabine) oral chemotherapy education.   PAST MEDICAL HISTORY: Past Medical History:  Diagnosis Date  . Cancer [facility]) 2021   right breast  . Cyst, breast    benign  . Diabetes mellitus without complication ([employer])   . Family history of melanoma   . Family history of ovarian cancer   . GERD (gastroesophageal reflux disease)   . H/O: rheumatic fever   . 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 Hyperlipidemia   . Hypertension   . Menopause    at age 89  . 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 Byrnett.] [id number] Carbo-taxol-AC;  JAN 2022-s/p Lumpectomy & ALND- [ypT1a (29m ) ypN1 (2/11 LN-positive)]  # SURVIVORSHIP: Pending  # GENETICS: Pending  DIAGNOSIS: Right breast cancer triple negative  STAGE:   IIB      ;  GOALS: cure  CURRENT/MOST RECENT THERAPY : RT    Carcinoma of upper-inner quadrant of right breast in female, estrogen receptor negative ([facility]  10/07/2019 Initial Diagnosis   Carcinoma of upper-inner quadrant of right breast in female, estrogen receptor negative ([facility]   12/04/2019 Genetic Testing   Negative genetic testing. No pathogenic  variants identified on the [employer] Common Hereditary Cancers Panel + Skin Cancers Panel. The report date is 12/04/2019.  The Common Hereditary Cancers Panel + Skin Cancers Panel offered by [facility] includes sequencing and/or deletion duplication testing of the following 54 genes: APC*, ATM*, AXIN2, BAP1, BARD1, BMPR1A, BRCA1, BRCA2, BRIP1, CDH1, CDK4, CDKN2A (p14ARF), CDKN2A (p16INK4a), CHEK2, CTNNA1, DICER1*, EPCAM*, GREM1*, HOXB13, KIT, MEN1*, MITF*, MLH1*, MSH2*, MSH3*, MSH6*, MUTYH, NBN, NF1*, NTHL1, PALB2, PDGFRA, PMS2*, POLD1*, POLE, POT1, PTCH1, PTEN*, RAD50, RAD51C, RAD51D, RB1*, RNF43, SDHA*, SDHB, SDHC*, SDHD, SMAD4, SMARCA4, STK11, SUFU, TP53, TSC1*, TSC2, VHL.    05/07/2020 Cancer Staging   Staging form: Breast, AJCC 8th Edition - Clinical: Stage IIIB (cT2, cN1, cM0, G3, ER-, PR-, HER2-) - Signed by BCammie Sickle MD on 05/07/2020     ALLERGIES:  has No Known Allergies.  MEDICATIONS:  Current Outpatient Medications  Medication Sig Dispense Refill  . acetaminophen (TYLENOL) 500 MG tablet Take 500 mg by mouth every 8 (eight) hours as needed for moderate pain. (Patient not taking: Reported on 07/26/2020)    . Biotin 1000 MCG tablet Take 1,000 mcg by mouth daily.    . capecitabine (XELODA) 500 MG tablet Take 4 pills in AM: and 4 pills in PM- 2 weeks-ON and 1 week OFF 112 tablet 6  . gabapentin (NEURONTIN) 100 MG capsule Take 1 capsule (100 mg total) by mouth at bedtime. (Patient taking differently: Take 100 mg by mouth daily.) 30 capsule 6  . HYDROcodone-acetaminophen (NORCO/VICODIN) 5-325 MG tablet Take 1 tablet by mouth  every 4 (four) hours as needed for moderate pain. 20 tablet 0  . losartan-hydrochlorothiazide (HYZAAR) 50-12.5 MG tablet Take 1 tablet by mouth daily. 90 tablet 3  . lovastatin (MEVACOR) 40 MG tablet TAKE 1 TABLET BY MOUTH AT BEDTIME 90 tablet 0  . metFORMIN (GLUCOPHAGE) 500 MG tablet TAKE 2 TABLETS BY MOUTH TWICE DAILY WITH A MEAL 120 tablet 0  . Multiple Vitamin  (MULTIVITAMIN) tablet Take 1 tablet by mouth daily.    Marland Kitchen omeprazole (PRILOSEC) 20 MG capsule Take 1 capsule by mouth once daily 90 capsule 0   No current facility-administered medications for this visit.    VITAL SIGNS: There were no vitals taken for this visit. There were no vitals filed for this visit.  Estimated body mass index is 29.95 kg/m as calculated from the following:   Height as of an earlier encounter on 07/26/20: 5' 7"  (1.702 m).   Weight as of an earlier encounter on 07/26/20: 86.7 kg (191 lb 3.2 oz).  LABS: CBC:    Component Value Date/Time   WBC 4.6 07/26/2020 0934   HGB 12.2 07/26/2020 0934   HCT 37.2 07/26/2020 0934   PLT 166 07/26/2020 0934   MCV 85.9 07/26/2020 0934   NEUTROABS 3.2 07/26/2020 0934   LYMPHSABS 0.9 07/26/2020 0934   MONOABS 0.4 07/26/2020 0934   EOSABS 0.2 07/26/2020 0934   BASOSABS 0.0 07/26/2020 0934   Comprehensive Metabolic Panel:    Component Value Date/Time   NA 138 07/26/2020 0934   K 3.4 (L) 07/26/2020 0934   CL 100 07/26/2020 0934   CO2 23 07/26/2020 0934   BUN 16 07/26/2020 0934   CREATININE 0.78 07/26/2020 0934   GLUCOSE 225 (H) 07/26/2020 0934   CALCIUM 9.6 07/26/2020 0934   AST 80 (H) 07/26/2020 0934   ALT 61 (H) 07/26/2020 0934   ALKPHOS 97 07/26/2020 0934   BILITOT 0.7 07/26/2020 0934   PROT 7.2 07/26/2020 0934   ALBUMIN 3.7 07/26/2020 0934   Present during visit- patient and her husband  Assessment and Plan-  Start plan: Xeloda in about one week (pending medication access)   Planned duration of 6-8 cycles.  CMP/CBC from 07/26/20 assessed, no relevant lab abnormalities. Prescription dose and frequency assessed.   Current medication list in Epic reviewed, one DDIs with capecitabine identified: - Omeprazole: Proton Pump Inhibitors (PPI) may diminish the therapeutic effect of capecitabine, varying information on the clinical impact. [name] reports that she has been on the omeprazole for years and really struggles  with reflux. She forgot her omeprazole one day and had immediate reflux. Due to the varying data on the impact on this DDI, she will remain on the omeprazole for now.  Evaluated chart and no patient barriers to medication adherence identified.   Patient Education  Administration: Counseled patient on administration, dosing, side effects, monitoring, drug-food interactions, safe handling, storage, and disposal. Patient will take 2000 mg (4 tablets) by mouth twice daily for 14 days, then hold for 7 days. Repeat every 21 days.  Side Effects: Side effects include but not limited to: diarrhea, N/V, fatigue, decreased wbc/hgb/plt, hand-foot symdrome.    Nausea: she did not have trouble with nausea during her IV treatment Diarrhea: she reports that her bowels have been looser since her dose of metformin was increased. Discussed with Dr. Rogue Bussing and we will try switching her over to metformin extended release to help with the loose stools  Adherence: After discussion with patient no patient barriers to medication adherence identified.  Reviewed with patient importance of keeping a medication schedule and plan for any missed doses.  During today's visit, Mrs. Arenson was provided with a capecitabine handout provided, AM/PA pill box, and [place] Smooth hand lotion.  Provided patient with [facility] phone number. Patient knows to call the office with questions or concerns. Oral Chemotherapy Navigation Clinic will continue to follow.  The [place] voiced understanding and appreciation. All questions answered.   Patient expressed understanding and was in agreement with this plan. She also understands that She can call clinic at any time with any questions, concerns, or complaints.   Medication Access Issues: During the visit today Ashlee Cox, patient advocate was able to do a benefits investigation to determine that her out of pocket would be ~$400 per cycle. She has the  patient sign up for manufacturer assistance while in the office today. Ashlee Cox will continue to follow for approval status.  Thank you for allowing me to participate in the care of this patient.   Time Total: 40 mins  Visit consisted of counseling and education on dealing with issues of symptom management in the setting of serious and potentially life-threatening illness.Greater than 50%  of this time was spent counseling and coordinating care related to the above assessment and plan.  Signed by: Darl Pikes, PharmD, BCPS, Salley Slaughter, CPP Hematology/Oncology Clinical Pharmacist Practitioner [facility]/HP/AP [facility] [phone]  07/26/2020 11:42 AM

## 2020-07-27 DIAGNOSIS — Z853 Personal history of malignant neoplasm of breast: Secondary | ICD-10-CM | POA: Diagnosis not present

## 2020-07-28 NOTE — Telephone Encounter (Signed)
Oral Oncology Patient Advocate Encounter  Received notification from [facility] Patient Assistance program that patient has been successfully enrolled into their program to receive Xeloda from the manufacturer at $0 out of pocket until therapy discontinued, no longer meets eligibility requirements, or health insurance or financial status changes.   I will call and let her know of the approval.   Specialty Pharmacy that will dispense medication is [facility]  [phone]).  Patient knows to call the office with questions or concerns.   Oral Oncology Clinic will continue to follow.  [place] Patient [place] Phone [phone] Fax [phone] 07/28/2020 3:59 PM

## 2020-08-02 NOTE — Telephone Encounter (Signed)
Oral Oncology Patient Advocate Encounter  Mrs Derk called this morning to let me know she spoke with [facility] pharmacy and Xeloda will be delivered 5/14. (I called [facility] to see if we could expedite shipping, originally scheduled for 5/25).  [place] Patient [place] Phone [phone] Fax [phone] 08/02/2020 12:33 PM

## 2020-08-06 ENCOUNTER — Telehealth: Payer: Self-pay | Admitting: *Deleted

## 2020-08-06 NOTE — Telephone Encounter (Signed)
Ashlee Cox - please advise if patient can start xeloda tomorrow

## 2020-08-06 NOTE — Telephone Encounter (Signed)
Per Alyson L, pharmacist. "I would have her wait until 5/17 to get started. That will put her next f/u appt with Dr. B the day before she is to start cycle 2. She can check in with him then get the go ahead to get started the next day"

## 2020-08-06 NOTE — Telephone Encounter (Signed)
Patient contacted - pt instructed to start her xeloda on 5/17. Patient gave verbal understanding of the plan of care.

## 2020-08-06 NOTE — Telephone Encounter (Signed)
Patient called stating that her chemotherapy medicine is to be delivered tomorrow and she is asking when she is supposed to start taking it. She requests a return call [phone]

## 2020-08-26 ENCOUNTER — Other Ambulatory Visit: Payer: Self-pay

## 2020-08-26 DIAGNOSIS — Z171 Estrogen receptor negative status [ER-]: Secondary | ICD-10-CM

## 2020-08-26 DIAGNOSIS — C50211 Malignant neoplasm of upper-inner quadrant of right female breast: Secondary | ICD-10-CM

## 2020-08-30 ENCOUNTER — Inpatient Hospital Stay: Payer: PPO | Admitting: Internal Medicine

## 2020-08-30 ENCOUNTER — Inpatient Hospital Stay: Payer: PPO | Attending: Internal Medicine

## 2020-08-30 ENCOUNTER — Encounter: Payer: Self-pay | Admitting: Internal Medicine

## 2020-08-30 ENCOUNTER — Other Ambulatory Visit: Payer: Self-pay

## 2020-08-30 DIAGNOSIS — G620 Drug-induced polyneuropathy: Secondary | ICD-10-CM

## 2020-08-30 DIAGNOSIS — Z8041 Family history of malignant neoplasm of ovary: Secondary | ICD-10-CM | POA: Diagnosis not present

## 2020-08-30 DIAGNOSIS — Z006 Encounter for examination for normal comparison and control in clinical research program: Secondary | ICD-10-CM

## 2020-08-30 DIAGNOSIS — T451X5A Adverse effect of antineoplastic and immunosuppressive drugs, initial encounter: Secondary | ICD-10-CM | POA: Diagnosis not present

## 2020-08-30 DIAGNOSIS — C50211 Malignant neoplasm of upper-inner quadrant of right female breast: Secondary | ICD-10-CM

## 2020-08-30 DIAGNOSIS — L271 Localized skin eruption due to drugs and medicaments taken internally: Secondary | ICD-10-CM | POA: Diagnosis not present

## 2020-08-30 DIAGNOSIS — Z171 Estrogen receptor negative status [ER-]: Secondary | ICD-10-CM

## 2020-08-30 DIAGNOSIS — Z7984 Long term (current) use of oral hypoglycemic drugs: Secondary | ICD-10-CM | POA: Insufficient documentation

## 2020-08-30 DIAGNOSIS — E119 Type 2 diabetes mellitus without complications: Secondary | ICD-10-CM | POA: Insufficient documentation

## 2020-08-30 DIAGNOSIS — Z87891 Personal history of nicotine dependence: Secondary | ICD-10-CM | POA: Diagnosis not present

## 2020-08-30 LAB — CBC WITH DIFFERENTIAL/PLATELET
Abs Immature Granulocytes: 0.02 10*3/uL (ref 0.00–0.07)
Basophils Absolute: 0 10*3/uL (ref 0.0–0.1)
Basophils Relative: 1 %
Eosinophils Absolute: 0.1 10*3/uL (ref 0.0–0.5)
Eosinophils Relative: 3 %
HCT: 34.8 % — ABNORMAL LOW (ref 36.0–46.0)
Hemoglobin: 11.6 g/dL — ABNORMAL LOW (ref 12.0–15.0)
Immature Granulocytes: 1 %
Lymphocytes Relative: 22 %
Lymphs Abs: 0.9 10*3/uL (ref 0.7–4.0)
MCH: 29.2 pg (ref 26.0–34.0)
MCHC: 33.3 g/dL (ref 30.0–36.0)
MCV: 87.7 fL (ref 80.0–100.0)
Monocytes Absolute: 0.6 10*3/uL (ref 0.1–1.0)
Monocytes Relative: 15 %
Neutro Abs: 2.5 10*3/uL (ref 1.7–7.7)
Neutrophils Relative %: 58 %
Platelets: 152 10*3/uL (ref 150–400)
RBC: 3.97 MIL/uL (ref 3.87–5.11)
RDW: 17.3 % — ABNORMAL HIGH (ref 11.5–15.5)
WBC: 4.3 10*3/uL (ref 4.0–10.5)
nRBC: 0 % (ref 0.0–0.2)

## 2020-08-30 LAB — COMPREHENSIVE METABOLIC PANEL
ALT: 75 U/L — ABNORMAL HIGH (ref 0–44)
AST: 129 U/L — ABNORMAL HIGH (ref 15–41)
Albumin: 3.7 g/dL (ref 3.5–5.0)
Alkaline Phosphatase: 97 U/L (ref 38–126)
Anion gap: 12 (ref 5–15)
BUN: 13 mg/dL (ref 8–23)
CO2: 22 mmol/L (ref 22–32)
Calcium: 8.9 mg/dL (ref 8.9–10.3)
Chloride: 102 mmol/L (ref 98–111)
Creatinine, Ser: 0.75 mg/dL (ref 0.44–1.00)
GFR, Estimated: >60 mL/min (ref >60)
Glucose, Bld: 264 mg/dL — ABNORMAL HIGH (ref 70–99)
Potassium: 3.7 mmol/L (ref 3.5–5.1)
Sodium: 136 mmol/L (ref 135–145)
Total Bilirubin: 0.8 mg/dL (ref 0.3–1.2)
Total Protein: 7.2 g/dL (ref 6.5–8.1)

## 2020-08-30 MED ORDER — HEPARIN SOD (PORK) LOCK FLUSH 100 UNIT/ML IV SOLN
500.0000 [IU] | Freq: Once | INTRAVENOUS | Status: AC
  Administered 2020-08-30: 500 [IU] via INTRAVENOUS
  Filled 2020-08-30: qty 5

## 2020-08-30 MED ORDER — GABAPENTIN 100 MG PO CAPS: 200.0000 mg | ORAL_CAPSULE | Freq: Every day | ORAL | 6 refills | Status: DC

## 2020-08-30 MED ORDER — SODIUM CHLORIDE 0.9% FLUSH
10.0000 mL | Freq: Once | INTRAVENOUS | Status: AC
Start: 2020-08-30 — End: 2020-08-30
  Administered 2020-08-30: 10 mL via INTRAVENOUS
  Filled 2020-08-30: qty 10

## 2020-08-30 MED ORDER — HEPARIN SOD (PORK) LOCK FLUSH 100 UNIT/ML IV SOLN
INTRAVENOUS | Status: AC
  Filled 2020-08-30: qty 5

## 2020-08-30 NOTE — Assessment & Plan Note (Addendum)
#   Right breast cancer-Stage IIB- T2N1-TRIPLE NEGATIVE;  s/p neoadjuvant therapy carbo platinum-Taxol; Adriamycin Cytoxan- ypT1ypN1.  Currently s/p adjuvant radiation.  # continue Xeloda 1000 mg per metered squared [twice daily; 2 weeks on 1 week off for 6-8 cycles]; given the incomplete pathologic response.  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  # DISPOSITION: # follow up in 3 weeks- MD; labs- cbc/cmp; -Dr.B

## 2020-08-30 NOTE — Progress Notes (Signed)
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 Byrnett.] [id number] Carbo-taxol-AC;  JAN 2022-s/p Lumpectomy & ALND- [ypT1a (61m ) ypN1 (2/11 LN-positive)]; s/p radiation end of April 2022.  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 No pathogenic variants identified on the [facility] Common Hereditary Cancers Panel + Skin Cancers Panel. The report date is 12/04/2019.  The Common Hereditary Cancers Panel + Skin Cancers Panel offered by [facility] includes sequencing and/or deletion duplication testing of the following 54 genes: APC*, ATM*, AXIN2, BAP1, BARD1, BMPR1A, BRCA1, BRCA2, BRIP1, CDH1, CDK4, CDKN2A (p14ARF), CDKN2A (p16INK4a), CHEK2, CTNNA1, DICER1*, EPCAM*, GREM1*, HOXB13, KIT, MEN1*, MITF*, MLH1*, MSH2*, MSH3*, MSH6*, MUTYH, NBN, NF1*, NTHL1, PALB2, PDGFRA, PMS2*, POLD1*, POLE, POT1, PTCH1, PTEN*, RAD50, RAD51C, RAD51D, RB1*, RNF43, SDHA*, SDHB, SDHC*, SDHD, SMAD4, SMARCA4, STK11, SUFU, TP53, TSC1*, TSC2, VHL.    05/07/2020 Cancer Staging    Staging form: Breast, AJCC 8th Edition - Clinical: Stage IIIB (cT2, cN1, cM0, G3, ER-, PR-, HER2-) - Signed by BCammie Sickle MD on 05/07/2020       HISTORY OF PRESENTING ILLNESS:  Ashlee ArdBumgarner 70y.o.  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  Patient complains of mild pain in the palms and soles.  Mild redness noted.  Denies any blistering.  Denies any diarrhea.  Denies any fevers or chills.  Complains of continued tingling and numbness in extremities.  On gabapentin.  Review of Systems  Constitutional:  Positive for malaise/fatigue. Negative for chills, diaphoresis, fever and weight loss.  HENT:  Negative for nosebleeds and sore throat.   Eyes:  Negative for double vision.  Respiratory:  Negative for cough, hemoptysis, sputum production and wheezing.   Cardiovascular:  Negative for chest pain, palpitations, orthopnea and leg swelling.  Gastrointestinal:  Negative for abdominal pain, blood in stool, constipation and melena.  Genitourinary:  Negative for dysuria, frequency and urgency.  Musculoskeletal:  Negative for back pain and joint pain.  Skin: Negative.  Negative for itching and rash.  Neurological:  Positive for tingling. Negative for dizziness, focal weakness, weakness and headaches.  Endo/Heme/Allergies:  Does not bruise/bleed easily.  Psychiatric/Behavioral:  Negative for depression. The patient is not nervous/anxious and does not have insomnia.     MEDICAL HISTORY:  Past Medical History:  Diagnosis Date  . Cancer ([facility] 2021   right breast  . Cyst, breast    benign  . Diabetes mellitus without complication ([facility]   . Family history of melanoma   . Family history of ovarian cancer   . GERD (gastroesophageal reflux  disease)   . H/O: rheumatic fever   . 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 Hyperlipidemia   . Hypertension   .  Menopause    at age 77  . Personal history of chemotherapy 09/2019   RIGHT  INVASIVE MAMMARY CARCINOMA    SURGICAL HISTORY: Past Surgical History:  Procedure Laterality Date  . APPENDECTOMY  2006  . BREAST BIOPSY Right 09/22/2019   INVASIVE MAMMARY CARCINOMA  . BREAST CYST ASPIRATION Left 1999  . BREAST LUMPECTOMY Left 04/23/2020   surgery with NL and SN   . BREAST LUMPECTOMY WITH NEEDLE LOCALIZATION Right 04/23/2020   Procedure: BREAST LUMPECTOMY WITH NEEDLE LOCALIZATION;  Surgeon: Robert Bellow, MD;  Location: [facility] ORS;  Service: General;  Laterality: Right;  . BREAST LUMPECTOMY WITH SENTINEL LYMPH NODE BIOPSY Right 04/23/2020   Procedure: BREAST LUMPECTOMY WITH SENTINEL LYMPH NODE BX;  Surgeon: Robert Bellow, MD;  Location: [facility] ORS;  Service: General;  Laterality: Right;  . CHOLECYSTECTOMY  2006  . COLONOSCOPY    . PORTACATH PLACEMENT Left 10/06/2019   Procedure: INSERTION PORT-A-CATH;  Surgeon: Robert Bellow, MD;  Location: [facility] ORS;  Service: General;  Laterality: Left;  . SUBMUCOSAL TATTOO INJECTION Right 10/06/2019   Procedure: Right axillary TATTOO INJECTION;  Surgeon: Robert Bellow, MD;  Location: [facility] ORS;  Service: General;  Laterality: Right;  Marland Kitchen VAGINAL DELIVERY     x3    SOCIAL HISTORY: Social History   Socioeconomic History  . Marital status: Married    Spouse name: Chrissie Noa  . Number of children: 3  . Years of education: Not on file  . Highest education level: Not on file  Occupational History  . Occupation: Glass blower/designer: [id number]  Tobacco Use  . Smoking status: Former    Years: 1.00    Pack years: 0.00    Types: Cigarettes    Quit date: 12/26/2005    Years since quitting: 14.7  . Smokeless tobacco: Never  . Tobacco comments:    smoked for less than 1 years,  1 cig/day  Vaping Use  . Vaping Use: Never used  Substance and Sexual Activity  . Alcohol use: No  . Drug use: No  . Sexual activity: Never    Partners: Female  Other  Topics Concern  . Not on file  Social History Narrative   Widowed, March 2014; remarried.      [employer]; quit smoking 1ppw; no alcohol.       Social Determinants of Health   Financial Resource Strain: Not on file  Food Insecurity: Not on file  Transportation Needs: Not on file  Physical Activity: Not on file  Stress: Not on file  Social Connections: Not on file  Intimate Partner Violence: Not on file    FAMILY HISTORY: Family History  Problem Relation Age of Onset  . Cancer Mother 27       ovarian- died 4-5 years.   . Heart disease Mother 70  . Diabetes Mother   . Stroke Father 40  . Diabetes Father   . Diabetes Sister   . Melanoma Sister        survived  . Breast cancer Neg Hx     ALLERGIES:  has No Known Allergies.  MEDICATIONS:  Current Outpatient Medications  Medication Sig Dispense Refill  . Biotin 1000 MCG tablet Take 1,000 mcg by mouth daily.    Marland Kitchen  capecitabine (XELODA) 500 MG tablet Take 4 pills in AM: and 4 pills in PM- 2 weeks-ON and 1 week OFF 112 tablet 6  . HYDROcodone-acetaminophen (NORCO/VICODIN) 5-325 MG tablet Take 1 tablet by mouth every 4 (four) hours as needed for moderate pain. 20 tablet 0  . losartan-hydrochlorothiazide (HYZAAR) 50-12.5 MG tablet Take 1 tablet by mouth daily. 90 tablet 3  . Multiple Vitamin (MULTIVITAMIN) tablet Take 1 tablet by mouth daily.    Marland Kitchen omeprazole (PRILOSEC) 20 MG capsule Take 1 capsule by mouth once daily 90 capsule 0  . acetaminophen (TYLENOL) 500 MG tablet Take 500 mg by mouth every 8 (eight) hours as needed for moderate pain.    Marland Kitchen gabapentin (NEURONTIN) 100 MG capsule Take 2 capsules (200 mg total) by mouth at bedtime. 60 capsule 6  . lovastatin (MEVACOR) 40 MG tablet TAKE 1 TABLET BY MOUTH AT BEDTIME 90 tablet 0  . metFORMIN (GLUCOPHAGE-XR) 500 MG 24 hr tablet Take 2 tablets (1,000 mg total) by mouth 2 (two) times daily. 120 tablet 0   No current facility-administered medications for this visit.       Marland Kitchen  PHYSICAL EXAMINATION: ECOG PERFORMANCE STATUS: 0 - Asymptomatic  Vitals:   08/30/20 1026  BP: 139/83  Pulse: 79  Resp: 18  Temp: 98.2 F (36.8 C)  SpO2: 100%   Filed Weights   08/30/20 1026  Weight: 188 lb 9.6 oz (85.5 kg)    Physical Exam Constitutional:      Comments: Ambulating independently.  Accompanied by her husband.  HENT:     Head: Normocephalic and atraumatic.     Mouth/Throat:     Pharynx: No oropharyngeal exudate.  Eyes:     Pupils: Pupils are equal, round, and reactive to light.  Cardiovascular:     Rate and Rhythm: Normal rate and regular rhythm.  Pulmonary:     Effort: Pulmonary effort is normal. No respiratory distress.     Breath sounds: Normal breath sounds. No wheezing.  Abdominal:     General: Bowel sounds are normal. There is no distension.     Palpations: Abdomen is soft. There is no mass.     Tenderness: no abdominal tenderness There is no guarding or rebound.  Musculoskeletal:        General: No tenderness. Normal range of motion.     Cervical back: Normal range of motion and neck supple.  Skin:    General: Skin is warm.  Neurological:     Mental Status: She is alert and oriented to person, place, and time.  Psychiatric:        Mood and Affect: Affect normal.     LABORATORY DATA:  I have reviewed the data as listed Lab Results  Component Value Date   WBC 4.3 08/30/2020   HGB 11.6 (L) 08/30/2020   HCT 34.8 (L) 08/30/2020   MCV 87.7 08/30/2020   PLT 152 08/30/2020   Recent Labs    12/02/19 0922 12/09/19 0844 12/23/19 0846 12/30/19 0839 03/24/20 0859 04/21/20 1010 07/26/20 0934 08/30/20 0949  NA 144 140 140   < > 135 140 138 136  K 3.4* 3.3* 3.3*   < > 3.4* 3.5 3.4* 3.7  CL 106 104 104   < > 100 104 100 102  CO2 _0 < > _1 GLUCOSE 194* 204* 237*   < > 219* 168* 225* 264*  BUN _2 < > _3 13  CREATININE 0.68 0.79 0.77   < > 0.63 0.69 0.78 0.75  CALCIUM 9.1 8.9 9.1   < > 8.9 9.9 9.6 8.9   GFRNONAA >60 >60 >60   < > >60 >60 >60 >60  GFRAA >60 >60 >60  --   --   --   --   --   PROT  --   --  6.7   < > 7.0  --  7.2 7.2  ALBUMIN  --   --  3.8   < > 3.9  --  3.7 3.7  AST  --   --  56*   < > 67*  --  80* 129*  ALT  --   --  43   < > 47*  --  61* 75*  ALKPHOS  --   --  66   < > 79  --  97 97  BILITOT  --   --  0.6   < > 0.8  --  0.7 0.8   < > = values in this interval not displayed.    RADIOGRAPHIC STUDIES: I have personally reviewed the radiological images as listed and agreed with the findings in the report. No results found.  ASSESSMENT & PLAN:   Carcinoma of upper-inner quadrant of right breast in female, estrogen receptor negative ([place]) # Right breast cancer-Stage IIB- T2N1-TRIPLE NEGATIVE;  s/p neoadjuvant therapy carbo platinum-Taxol; Adriamycin Cytoxan- ypT1ypN1.  Currently s/p adjuvant radiation.  # continue Xeloda 1000 mg per metered squared [twice daily; 2 weeks on 1 week off for 6-8 cycles]; given the incomplete pathologic response.  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  # DISPOSITION: # follow up in 3 weeks- MD; labs- cbc/cmp; -Dr.B   All questions were answered. The patient/family knows to call the clinic with any problems, questions or concerns.   Cammie Sickle, MD 09/12/2020 6:04 PM

## 2020-08-30 NOTE — Progress Notes (Signed)
Survivorship Care Plan visit completed.  Treatment summary reviewed and given to patient.  ASCO answers booklet reviewed and given to patient.  CARE program and Cancer Transitions discussed with patient along with other resources cancer center offers to patients and caregivers.  Patient verbalized understanding.    

## 2020-08-30 NOTE — Research (Signed)
SWOG M4158 52 Week Visit:  Patient in to the cancer center with her husband this morning for her scheduled 52 week visit for the SWOG S1714 protocol. Research labs were drawn in the infusion room this am at 1003 am via her port without any difficulties. The vials were transported to the lab for processing immediately after they were drawn by this nurse,  instructions for processing were provided to [facility]. The patient was provided her study questionnaires for her to complete prior to her visit with Dr. Rogue Bussing. The patient was escorted to room 15 and research nurse met with patient at this time to review her adverse events and perform the neuropathy assessments per protocol requirements. The patient is complaining of numbness and tingling to both hands, states her feet feel like she is walking on cotton when she stands up. Denies any neuropathy pain, states she does take Neurontin 100 mg at HS to help with her restless legs.  She denies having any falls in the last six months.  The patient was cautioned to take her time when standing and be very careful so as to prevent falling, which could be an adverse effect of the neuropathy. The patient states that she is having difficulty opening bottles and lids, her husband has to help her with this. She is not dropping any items and is able to perform all her activities of daily living without assistance. The monofilament and Neurotip exams were completed on her dominant right side, with the assistance of Clabe Seal, [facility] for documentation and timing. The patient was unable to feel the monofilament at site number 3 and 10. She didn't feel any sharpness with the Neurotip at site 3, 7, and 9. The tuning fork assessment was then completed with the assistance of Clabe Seal, [facility] for timing of sensation. The patient could not feel the tuning fork in the patellar region, medial malleolus or the interphalangeal joint great toe of her right leg and foot. She was able to feel the  sensations in her right hand and arm but for short amounts of time. Dr. Rogue Bussing examined the patient and reviewed her labs for her today.  She is due to start her second cycle of Xeloda oral chemotherapy Tuesday this week.  Dr. Rogue Bussing completed the solicited neuropathy events form and the follow up physician assessment form with grade 2 and 3 events attributed to Taxol treatment listed below and her toxicity burden at a "6". Dr. Rogue Bussing will increase her neurontin to 200 mg daily at hs. The patient was in agreement to try this. The schedule for protocol visits was explained to the patient, and will be an annual visit only x 2 around 10/06/2021 for the 104 month visit. The patient knows to contact the research RN if she has any questions or concerns. The research RN spent approximately one hour 30 minutes with the patient today in completion of research related protocol activities.  Jeral Fruit, RN  08/30/20 12:12 PM    Adverse Event Log  Study/Protocol: SWOG X0940 Cycle: 52 Weeks  Event Grade Onset Date Resolved Date Drug Name Attribution Treatment Comments  Dysesthesia 1 2 12/02/2019 03/03/2020 03/02/2020  Taxol 5    Neuralgia 0    0    Parasthesia _0 12/02/2019 03/03/2020 08/30/2020 03/02/2020 08/30/2020  Taxol 5    Peripheral motor neuropathy 0    0    Peripheral sensory neuropathy _1 12/02/2019 03/03/2020 08/30/2020 03/02/2020 08/30/2020 Taxol 5  Jeral Fruit, RN, BSN, OCN  08/30/20 12:11 PM

## 2020-08-31 DIAGNOSIS — Z171 Estrogen receptor negative status [ER-]: Secondary | ICD-10-CM | POA: Diagnosis not present

## 2020-08-31 DIAGNOSIS — C50211 Malignant neoplasm of upper-inner quadrant of right female breast: Secondary | ICD-10-CM | POA: Diagnosis not present

## 2020-09-01 ENCOUNTER — Other Ambulatory Visit: Payer: Self-pay

## 2020-09-01 ENCOUNTER — Ambulatory Visit
Admission: RE | Admit: 2020-09-01 | Discharge: 2020-09-01 | Disposition: A | Discharge status: Home / Self Care | Payer: PPO | Source: Ambulatory Visit | Attending: Radiation Oncology | Admitting: Radiation Oncology

## 2020-09-01 VITALS — BP 147/79 | HR 94 | Temp 97.9°F | Wt 188.0 lb

## 2020-09-01 DIAGNOSIS — Z923 Personal history of irradiation: Secondary | ICD-10-CM | POA: Diagnosis not present

## 2020-09-01 DIAGNOSIS — Z171 Estrogen receptor negative status [ER-]: Secondary | ICD-10-CM | POA: Diagnosis not present

## 2020-09-01 DIAGNOSIS — C50211 Malignant neoplasm of upper-inner quadrant of right female breast: Secondary | ICD-10-CM | POA: Insufficient documentation

## 2020-09-01 NOTE — Progress Notes (Signed)
Radiation Oncology Follow up Note  Name: Ashlee Cox   Date:   09/01/2020 MRN:  [id number] DOB: [birth year]    This 70 y.o. female presents to the clinic today for 1 month follow-up status post whole breast radiation to her right breast for stage IIb (T2 N1 M0) invasive mammary carcinoma triple negative.  REFERRING PROVIDER: [name], MD  HPI: Patient is a 70 year old female now at 1 month having completed whole breast and peripheral lymphatic radiation therapy to her right breast for stage IIb triple negative invasive mammary carcinoma status post neoadjuvant chemotherapy and then wide local excision.  Seen today in routine follow-up she is doing well.  She specifically denies breast tenderness cough or bone pain.  She is having no swelling in her upper extremities..  She is currently on Xeloda.  She is not on antiestrogen therapy based on the triple negative nature of her disease.  COMPLICATIONS OF TREATMENT: none  FOLLOW UP COMPLIANCE: keeps appointments   PHYSICAL EXAM:  BP (!) 147/79   Pulse 94   Temp 97.9 F (36.6 C) (Tympanic)   Wt 188 lb (85.3 kg)   BMI 29.44 kg/m  Lungs are clear to A&P cardiac examination essentially unremarkable with regular rate and rhythm. No dominant mass or nodularity is noted in either breast in 2 positions examined. Incision is well-healed. No axillary or supraclavicular adenopathy is appreciated. Cosmetic result is excellent.  No evidence of lymphedema in her upper extremities is noted.  Well-developed well-nourished patient in NAD. HEENT reveals PERLA, EOMI, discs not visualized.  Oral cavity is clear. No oral mucosal lesions are identified. Neck is clear without evidence of cervical or supraclavicular adenopathy. Lungs are clear to A&P. Cardiac examination is essentially unremarkable with regular rate and rhythm without murmur rub or thrill. Abdomen is benign with no organomegaly or masses noted. Motor sensory and DTR levels are equal and  symmetric in the upper and lower extremities. Cranial nerves II through XII are grossly intact. Proprioception is intact. No peripheral adenopathy or edema is identified. No motor or sensory levels are noted. Crude visual fields are within normal range.  RADIOLOGY RESULTS: No current films for review  PLAN: Present time patient is doing well 1 month out from whole breast and peripheral lymphatic radiation and pleased with her overall progress.  Of asked to see her back in 4 to 5 months for follow-up.  She continues close follow-up care with medical oncology.  Patient knows to call with any concerns.  I would like to take this opportunity to thank you for allowing me to participate in the care of your patient.Noreene Filbert, MD

## 2020-09-04 ENCOUNTER — Other Ambulatory Visit: Payer: Self-pay | Admitting: Internal Medicine

## 2020-09-04 DIAGNOSIS — E113559 Type 2 diabetes mellitus with stable proliferative diabetic retinopathy, unspecified eye: Secondary | ICD-10-CM

## 2020-09-07 ENCOUNTER — Other Ambulatory Visit: Payer: Self-pay | Admitting: *Deleted

## 2020-09-07 DIAGNOSIS — E113559 Type 2 diabetes mellitus with stable proliferative diabetic retinopathy, unspecified eye: Secondary | ICD-10-CM

## 2020-09-07 MED ORDER — METFORMIN HCL ER 500 MG PO TB24: 1000.0000 mg | ORAL_TABLET | Freq: Two times a day (BID) | ORAL | 0 refills | Status: DC

## 2020-09-08 ENCOUNTER — Encounter: Payer: Self-pay | Admitting: Internal Medicine

## 2020-09-12 ENCOUNTER — Encounter: Payer: Self-pay | Admitting: Internal Medicine

## 2020-09-20 ENCOUNTER — Inpatient Hospital Stay: Payer: PPO | Admitting: Internal Medicine

## 2020-09-20 ENCOUNTER — Inpatient Hospital Stay: Payer: PPO

## 2020-09-20 ENCOUNTER — Other Ambulatory Visit: Payer: Self-pay

## 2020-09-20 DIAGNOSIS — Z171 Estrogen receptor negative status [ER-]: Secondary | ICD-10-CM

## 2020-09-20 DIAGNOSIS — C50211 Malignant neoplasm of upper-inner quadrant of right female breast: Secondary | ICD-10-CM

## 2020-09-20 LAB — CBC WITH DIFFERENTIAL/PLATELET
Abs Immature Granulocytes: 0.03 10*3/uL (ref 0.00–0.07)
Basophils Absolute: 0.1 10*3/uL (ref 0.0–0.1)
Basophils Relative: 1 %
Eosinophils Absolute: 0.1 10*3/uL (ref 0.0–0.5)
Eosinophils Relative: 3 %
HCT: 32.1 % — ABNORMAL LOW (ref 36.0–46.0)
Hemoglobin: 10.9 g/dL — ABNORMAL LOW (ref 12.0–15.0)
Immature Granulocytes: 1 %
Lymphocytes Relative: 20 %
Lymphs Abs: 1 10*3/uL (ref 0.7–4.0)
MCH: 30.6 pg (ref 26.0–34.0)
MCHC: 34 g/dL (ref 30.0–36.0)
MCV: 90.2 fL (ref 80.0–100.0)
Monocytes Absolute: 0.6 10*3/uL (ref 0.1–1.0)
Monocytes Relative: 12 %
Neutro Abs: 3.2 10*3/uL (ref 1.7–7.7)
Neutrophils Relative %: 63 %
Platelets: 149 10*3/uL — ABNORMAL LOW (ref 150–400)
RBC: 3.56 MIL/uL — ABNORMAL LOW (ref 3.87–5.11)
RDW: 21 % — ABNORMAL HIGH (ref 11.5–15.5)
WBC: 4.9 10*3/uL (ref 4.0–10.5)
nRBC: 0 % (ref 0.0–0.2)

## 2020-09-20 LAB — COMPREHENSIVE METABOLIC PANEL
ALT: 55 U/L — ABNORMAL HIGH (ref 0–44)
AST: 101 U/L — ABNORMAL HIGH (ref 15–41)
Albumin: 3.7 g/dL (ref 3.5–5.0)
Alkaline Phosphatase: 110 U/L (ref 38–126)
Anion gap: 12 (ref 5–15)
BUN: 13 mg/dL (ref 8–23)
CO2: 24 mmol/L (ref 22–32)
Calcium: 9.3 mg/dL (ref 8.9–10.3)
Chloride: 99 mmol/L (ref 98–111)
Creatinine, Ser: 0.79 mg/dL (ref 0.44–1.00)
GFR, Estimated: >60 mL/min (ref >60)
Glucose, Bld: 184 mg/dL — ABNORMAL HIGH (ref 70–99)
Potassium: 3.4 mmol/L — ABNORMAL LOW (ref 3.5–5.1)
Sodium: 135 mmol/L (ref 135–145)
Total Bilirubin: 1.5 mg/dL — ABNORMAL HIGH (ref 0.3–1.2)
Total Protein: 7.1 g/dL (ref 6.5–8.1)

## 2020-09-20 MED ORDER — GLIPIZIDE 5 MG PO TABS: 5.0000 mg | ORAL_TABLET | Freq: Two times a day (BID) | ORAL | 1 refills | Status: DC

## 2020-09-20 NOTE — Assessment & Plan Note (Addendum)
#   Right breast cancer-Stage IIB- T2N1-TRIPLE NEGATIVE;  s/p neoadjuvant therapy carbo platinum-Taxol; Adriamycin Cytoxan- ypT1ypN1.  Currently s/p adjuvant radiation.  #Currently on Xeloda 1000 mg per metered squared [twice daily; 2 weeks on 1 week off for 6-8 cycles]; currently status post cycle #2; hold starting cycle #3 given-skin rash/see below.  # Hand foot syndrome-Xeloda-grade 2;-continue Cerva Ve SA; add urea 40% twice a day.  #Skin rash bilateral-upper extremities-likely secondary to Xeloda.  Hold Xeloda for 2 weeks; topical hydrocortisone.  # PN-1-2 sec to taxol-on Neurontin 200 qhs- STABLE.   # DM-continue metformin 1000 mg BID; FBS- 248; add glucotrol follow-up with PCP  # Mediport: port flush every 2 to 3 months.  # DISPOSITION: # follow up in 2 weeks- MD; labs- cbc/cmp; -Dr.B

## 2020-09-20 NOTE — Patient Instructions (Signed)
#  Use 40% urea cream once a day on hand and feet  #Used topical hydrocortisone 1%-on the rash/under arms/forearms  #Do not take Xeloda; until next visit in 2 weeks.

## 2020-09-20 NOTE — Progress Notes (Signed)
Pt reports 3 loose stools per day. She is not using any imodium. Rash on upper extremities bilaterally. Blistering and cracked skin on feet and palms of hands.

## 2020-09-20 NOTE — Progress Notes (Signed)
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 Byrnett.] [id number] Carbo-taxol-AC;  JAN 2022-s/p Lumpectomy & ALND- [ypT1a (51m ) ypN1 (2/11 LN-positive)]; s/p radiation end of April 2022.  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 No pathogenic variants identified on the [facility] Common Hereditary Cancers Panel + Skin Cancers Panel. The report date is 12/04/2019.  The Common Hereditary Cancers Panel + Skin Cancers Panel offered by [facility] includes sequencing and/or deletion duplication testing of the following 54 genes: APC*, ATM*, AXIN2, BAP1, BARD1, BMPR1A, BRCA1, BRCA2, BRIP1, CDH1, CDK4, CDKN2A (p14ARF), CDKN2A (p16INK4a), CHEK2, CTNNA1, DICER1*, EPCAM*, GREM1*, HOXB13, KIT, MEN1*, MITF*, MLH1*, MSH2*, MSH3*, MSH6*, MUTYH, NBN, NF1*, NTHL1, PALB2, PDGFRA, PMS2*, POLD1*, POLE, POT1, PTCH1, PTEN*, RAD50, RAD51C, RAD51D, RB1*, RNF43, SDHA*, SDHB, SDHC*, SDHD, SMAD4, SMARCA4, STK11, SUFU, TP53, TSC1*, TSC2, VHL.    05/07/2020 Cancer Staging    Staging form: Breast, AJCC 8th Edition - Clinical: Stage IIIB (cT2, cN1, cM0, G3, ER-, PR-, HER2-) - Signed by BCammie Sickle MD on 05/07/2020       HISTORY OF PRESENTING ILLNESS:  Ashlee Cox 70y.o.  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  Patient notes to have mild to moderate pain in the palms and soles.  Also notes to have mild redness with dry desquamation.  Also notes to have a rash in the bilateral upper extremities with itching.   She continues to have tingling and numbness in the extremities; obviously gotten worse with rash on palms and soles.  Review of Systems  Constitutional:  Positive for malaise/fatigue. Negative for chills, diaphoresis, fever and weight loss.  HENT:  Negative for nosebleeds and sore throat.   Eyes:  Negative for double vision.  Respiratory:  Negative for cough, hemoptysis, sputum production and wheezing.   Cardiovascular:  Negative for chest pain, palpitations, orthopnea and leg swelling.  Gastrointestinal:  Negative for abdominal pain, blood in stool, constipation and melena.  Genitourinary:  Negative for dysuria, frequency and urgency.  Musculoskeletal:  Negative for back pain and joint pain.  Skin:  Positive for itching and rash.  Neurological:  Positive for tingling. Negative for dizziness, focal weakness, weakness and headaches.  Endo/Heme/Allergies:  Does not bruise/bleed easily.  Psychiatric/Behavioral:  Negative for depression. The patient is not nervous/anxious and does not have insomnia.     MEDICAL HISTORY:  Past Medical History:  Diagnosis Date   Cancer ([facility] 2021   right breast   Cyst, breast    benign   Diabetes mellitus without complication ([facility]    Family  history of melanoma    Family history of ovarian cancer    GERD (gastroesophageal reflux disease)    H/O: rheumatic fever    History of colonoscopy 2010   done bc of bleeding,  normal ,  due back in 5 yrs ([place])   Hyperlipidemia    Hypertension    Menopause    at age 19   Personal history of chemotherapy 09/2019   RIGHT  INVASIVE MAMMARY CARCINOMA    SURGICAL HISTORY: Past Surgical History:  Procedure Laterality Date   APPENDECTOMY  2006   BREAST BIOPSY Right 09/22/2019   INVASIVE MAMMARY CARCINOMA   BREAST CYST ASPIRATION Left 1999   BREAST LUMPECTOMY Left 04/23/2020   surgery with NL and SN    BREAST LUMPECTOMY WITH NEEDLE LOCALIZATION Right 04/23/2020   Procedure: BREAST LUMPECTOMY WITH NEEDLE LOCALIZATION;  Surgeon: Robert Bellow, MD;  Location: [facility];  Service: General;  Laterality: Right;   BREAST LUMPECTOMY WITH SENTINEL LYMPH NODE BIOPSY Right 04/23/2020   Procedure: BREAST LUMPECTOMY WITH SENTINEL LYMPH NODE BX;  Surgeon: Robert Bellow, MD;  Location: [facility];  Service: General;  Laterality: Right;   CHOLECYSTECTOMY  2006   COLONOSCOPY     PORTACATH PLACEMENT Left 10/06/2019   Procedure: INSERTION PORT-A-CATH;  Surgeon: Robert Bellow, MD;  Location: [facility];  Service: General;  Laterality: Left;   SUBMUCOSAL TATTOO INJECTION Right 10/06/2019   Procedure: Right axillary TATTOO INJECTION;  Surgeon: Robert Bellow, MD;  Location: [facility] OR[facility];  Service: General;  Laterality: Right;   VAGINAL DELIVERY     x3    SOCIAL HISTORY: Social History   Socioeconomic History   Marital status: Married    Spouse name: Chrissie Noa   Number of children: 3   Years of education: Not on file   Highest education level: Not on file  Occupational History   Occupation: Glass blower/designer: [employer]  Tobacco Use   Smoking status: Former    Years: 1.00    Pack years: 0.00    Types: Cigarettes    Quit date: 12/26/2005    Years since quitting: 14.7   Smokeless tobacco: Never   Tobacco comments:    smoked for less than 1 years,  1 cig/day  Vaping Use   Vaping Use: Never used  Substance and Sexual Activity   Alcohol use: No   Drug use: No   Sexual  activity: Never    Partners: Female  Other Topics Concern   Not on file  Social History Narrative   Widowed, March 2014; remarried.      [employer] ret[employer]; quit smoking 1ppw; no alcohol.       Social Determinants of Health   Financial Resource Strain: Not on file  Food Insecurity: Not on file  Transportation Needs: Not on file  Physical Activity: Not on file  Stress: Not on file  Social Connections: Not on file  Intimate Partner Violence: Not on file    FAMILY HISTORY: Family History  Problem Relation Age of Onset   Cancer Mother 92       ovarian- died 4-5 years.    Heart disease Mother 29   Diabetes Mother    Stroke Father 28   Diabetes Father    Diabetes Sister    Melanoma Sister        survived   Breast cancer Neg Hx     ALLERGIES:  has No Known Allergies.  MEDICATIONS:  Current Outpatient Medications  Medication Sig Dispense  Refill   acetaminophen (TYLENOL) 500 MG tablet Take 500 mg by mouth every 8 (eight) hours as needed for moderate pain.     Biotin 1000 MCG tablet Take 1,000 mcg by mouth daily.     gabapentin (NEURONTIN) 100 MG capsule Take 2 capsules (200 mg total) by mouth at bedtime. 60 capsule 6   glipiZIDE (GLUCOTROL) 5 MG tablet Take 1 tablet (5 mg total) by mouth 2 (two) times daily before a meal. 60 tablet 1   HYDROcodone-acetaminophen (NORCO/VICODIN) 5-325 MG tablet Take 1 tablet by mouth every 4 (four) hours as needed for moderate pain. 20 tablet 0   losartan-hydrochlorothiazide (HYZAAR) 50-12.5 MG tablet Take 1 tablet by mouth daily. 90 tablet 3   lovastatin (MEVACOR) 40 MG tablet TAKE 1 TABLET BY MOUTH AT BEDTIME 90 tablet 0   metFORMIN (GLUCOPHAGE-XR) 500 MG 24 hr tablet Take 2 tablets (1,000 mg total) by mouth 2 (two) times daily. 120 tablet 0   Multiple Vitamin (MULTIVITAMIN) tablet Take 1 tablet by mouth daily.     omeprazole (PRILOSEC) 20 MG capsule Take 1 capsule by mouth once daily 90 capsule 0   capecitabine (XELODA) 500 MG tablet Take 4 pills  in AM: and 4 pills in PM- 2 weeks-ON and 1 week OFF (Patient not taking: Reported on 09/20/2020) 112 tablet 6   No current facility-administered medications for this visit.      Marland Kitchen  PHYSICAL EXAMINATION: ECOG PERFORMANCE STATUS: 0 - Asymptomatic  Vitals:   09/20/20 1055  BP: (!) 137/111  Pulse: (!) 104  Temp: 98.2 F (36.8 C)  SpO2: 98%   Filed Weights   09/20/20 1055  Weight: 187 lb (84.8 kg)    Physical Exam Constitutional:      Comments: Ambulating independently.  Accompanied by her husband.  HENT:     Head: Normocephalic and atraumatic.     Mouth/Throat:     Pharynx: No oropharyngeal exudate.  Eyes:     Pupils: Pupils are equal, round, and reactive to light.  Cardiovascular:     Rate and Rhythm: Normal rate and regular rhythm.  Pulmonary:     Effort: Pulmonary effort is normal. No respiratory distress.     Breath sounds: Normal breath sounds. No wheezing.  Abdominal:     General: Bowel sounds are normal. There is no distension.     Palpations: Abdomen is soft. There is no mass.     Tenderness: no abdominal tenderness There is no guarding or rebound.  Musculoskeletal:        General: No tenderness. Normal range of motion.     Cervical back: Normal range of motion and neck supple.  Skin:    General: Skin is warm.     Comments: Macular rash BIl UE; and foot syndrome soles/palms- bil  Neurological:     Mental Status: She is alert and oriented to person, place, and time.  Psychiatric:        Mood and Affect: Affect normal.     LABORATORY DATA:  I have reviewed the data as listed Lab Results  Component Value Date   WBC 4.9 09/20/2020   HGB 10.9 (L) 09/20/2020   HCT 32.1 (L) 09/20/2020   MCV 90.2 09/20/2020   PLT 149 (L) 09/20/2020   Recent Labs    12/02/19 0922 12/09/19 0844 12/23/19 0846 12/30/19 0839 07/26/20 0934 08/30/20 0949 09/20/20 1010  NA 144 140 140   < > 138 136 135  K 3.4* 3.3* 3.3*   < >  3.4* 3.7 3.4*  CL 106 104 104   < > 100 102  99  CO2 _0 < > _1 GLUCOSE 194* 204* 237*   < > 225* 264* 184*  BUN _2 < > _3 CREATININE 0.68 0.79 0.77   < > 0.78 0.75 0.79  CALCIUM 9.1 8.9 9.1   < > 9.6 8.9 9.3  GFRNONAA >60 >60 >60   < > >60 >60 >60  GFRAA >60 >60 >60  --   --   --   --   PROT  --   --  6.7   < > 7.2 7.2 7.1  ALBUMIN  --   --  3.8   < > 3.7 3.7 3.7  AST  --   --  56*   < > 80* 129* 101*  ALT  --   --  43   < > 61* 75* 55*  ALKPHOS  --   --  66   < > 97 97 110  BILITOT  --   --  0.6   < > 0.7 0.8 1.5*   < > = values in this interval not displayed.    RADIOGRAPHIC STUDIES: I have personally reviewed the radiological images as listed and agreed with the findings in the report. No results found.  ASSESSMENT & PLAN:   Carcinoma of upper-inner quadrant of right breast in female, estrogen receptor negative ([place]) # Right breast cancer-Stage IIB- T2N1-TRIPLE NEGATIVE;  s/p neoadjuvant therapy carbo platinum-Taxol; Adriamycin Cytoxan- ypT1ypN1.  Currently s/p adjuvant radiation.  # continue Xeloda 1000 mg per metered squared [twice daily; 2 weeks on 1 week off for 6-8 cycles]; given the incomplete pathologic response.  cyle #2 on starting 6/07- HOLD Cyle #3-   # Hand foot syndrome-Xeloda-grade 2; recommend Cerva Ve SA; add urea 40%  # rash- tpoical hyroconttone  # PN-1-2 sec to taxol-on Neurontin 200 qhs [increased]  # DM-continue metformin 1000 mg BID; FBS- 248; add glucotrol follow-up with PCP  #Port malfunction-port flush every 2 to 3 months.  # DISPOSITION: # follow up in 2 weeks- MD; labs- cbc/cmp; -Dr.B   All questions were answered. The patient/family knows to call the clinic with any problems, questions or concerns.   Cammie Sickle, MD 09/27/2020 6:57 PM

## 2020-09-27 ENCOUNTER — Encounter: Payer: Self-pay | Admitting: Internal Medicine

## 2020-10-04 ENCOUNTER — Encounter: Payer: Self-pay | Admitting: Internal Medicine

## 2020-10-04 ENCOUNTER — Inpatient Hospital Stay: Payer: PPO | Attending: Internal Medicine

## 2020-10-04 ENCOUNTER — Inpatient Hospital Stay: Payer: PPO | Admitting: Internal Medicine

## 2020-10-04 ENCOUNTER — Other Ambulatory Visit: Payer: Self-pay

## 2020-10-04 DIAGNOSIS — R21 Rash and other nonspecific skin eruption: Secondary | ICD-10-CM | POA: Insufficient documentation

## 2020-10-04 DIAGNOSIS — C50211 Malignant neoplasm of upper-inner quadrant of right female breast: Secondary | ICD-10-CM | POA: Diagnosis not present

## 2020-10-04 DIAGNOSIS — Z171 Estrogen receptor negative status [ER-]: Secondary | ICD-10-CM | POA: Insufficient documentation

## 2020-10-04 DIAGNOSIS — G620 Drug-induced polyneuropathy: Secondary | ICD-10-CM | POA: Diagnosis not present

## 2020-10-04 DIAGNOSIS — E119 Type 2 diabetes mellitus without complications: Secondary | ICD-10-CM | POA: Diagnosis not present

## 2020-10-04 DIAGNOSIS — R200 Anesthesia of skin: Secondary | ICD-10-CM | POA: Insufficient documentation

## 2020-10-04 DIAGNOSIS — R202 Paresthesia of skin: Secondary | ICD-10-CM | POA: Insufficient documentation

## 2020-10-04 DIAGNOSIS — Z7984 Long term (current) use of oral hypoglycemic drugs: Secondary | ICD-10-CM | POA: Insufficient documentation

## 2020-10-04 LAB — CBC WITH DIFFERENTIAL/PLATELET
Abs Immature Granulocytes: 0.02 10*3/uL (ref 0.00–0.07)
Basophils Absolute: 0.1 10*3/uL (ref 0.0–0.1)
Basophils Relative: 1 %
Eosinophils Absolute: 0.2 10*3/uL (ref 0.0–0.5)
Eosinophils Relative: 4 %
HCT: 36.3 % (ref 36.0–46.0)
Hemoglobin: 11.8 g/dL — ABNORMAL LOW (ref 12.0–15.0)
Immature Granulocytes: 0 %
Lymphocytes Relative: 23 %
Lymphs Abs: 1.1 10*3/uL (ref 0.7–4.0)
MCH: 31 pg (ref 26.0–34.0)
MCHC: 32.5 g/dL (ref 30.0–36.0)
MCV: 95.3 fL (ref 80.0–100.0)
Monocytes Absolute: 0.5 10*3/uL (ref 0.1–1.0)
Monocytes Relative: 11 %
Neutro Abs: 2.8 10*3/uL (ref 1.7–7.7)
Neutrophils Relative %: 61 %
Platelets: 156 10*3/uL (ref 150–400)
RBC: 3.81 MIL/uL — ABNORMAL LOW (ref 3.87–5.11)
RDW: 20.3 % — ABNORMAL HIGH (ref 11.5–15.5)
WBC: 4.7 10*3/uL (ref 4.0–10.5)
nRBC: 0 % (ref 0.0–0.2)

## 2020-10-04 LAB — COMPREHENSIVE METABOLIC PANEL
ALT: 42 U/L (ref 0–44)
AST: 72 U/L — ABNORMAL HIGH (ref 15–41)
Albumin: 3.8 g/dL (ref 3.5–5.0)
Alkaline Phosphatase: 106 U/L (ref 38–126)
Anion gap: 8 (ref 5–15)
BUN: 12 mg/dL (ref 8–23)
CO2: 25 mmol/L (ref 22–32)
Calcium: 9.7 mg/dL (ref 8.9–10.3)
Chloride: 105 mmol/L (ref 98–111)
Creatinine, Ser: 0.82 mg/dL (ref 0.44–1.00)
GFR, Estimated: >60 mL/min (ref >60)
Glucose, Bld: 116 mg/dL — ABNORMAL HIGH (ref 70–99)
Potassium: 3.8 mmol/L (ref 3.5–5.1)
Sodium: 138 mmol/L (ref 135–145)
Total Bilirubin: 0.7 mg/dL (ref 0.3–1.2)
Total Protein: 7.6 g/dL (ref 6.5–8.1)

## 2020-10-04 NOTE — Patient Instructions (Signed)
#  Start Xeloda 3 pills in the morning and 3 pills in the evening-2 weeks on 1 week off.

## 2020-10-04 NOTE — Progress Notes (Signed)
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 Byrnett.] [id number] Carbo-taxol-AC;  JAN 2022-s/p Lumpectomy & ALND- [ypT1a (49m ) ypN1 (2/11 LN-positive)]; s/p radiation end of April 2022.  #Early June 2022-Xeloda 1000 mg per metered square [2000] 2 weeks on 1 week of 6-8 cycles; July 11th 2022- STARTING cycle #3-cut down the dose to 1500 mg BID[sec to HFS].  ; # SURVIVORSHIP: Pending  # GENETICS: Pending  DIAGNOSIS: Right breast cancer triple negative  STAGE:   IIB      ;  GOALS: cure  CURRENT/MOST RECENT THERAPY : RT    Carcinoma of upper-inner quadrant of right breast in female, estrogen receptor negative ([facility]  10/07/2019 Initial Diagnosis   Carcinoma of upper-inner quadrant of right breast in female, estrogen receptor negative ([facility]   12/04/2019 Genetic Testing   Negative genetic testing. No pathogenic variants identified on the [employer] Common Hereditary Cancers Panel + Skin Cancers Panel. The report date is 12/04/2019.  The Common Hereditary Cancers Panel + Skin Cancers Panel offered by [facility] includes sequencing and/or deletion duplication testing of the following 54 genes: APC*, ATM*, AXIN2, BAP1, BARD1, BMPR1A, BRCA1, BRCA2, BRIP1, CDH1, CDK4, CDKN2A (p14ARF), CDKN2A (p16INK4a), CHEK2, CTNNA1, DICER1*, EPCAM*, GREM1*, HOXB13, KIT, MEN1*, MITF*, MLH1*, MSH2*, MSH3*, MSH6*, MUTYH, NBN, NF1*, NTHL1, PALB2, PDGFRA, PMS2*, POLD1*, POLE, POT1, PTCH1, PTEN*, RAD50, RAD51C, RAD51D, RB1*, RNF43, SDHA*, SDHB, SDHC*, SDHD,  SMAD4, SMARCA4, STK11, SUFU, TP53, TSC1*, TSC2, VHL.    05/07/2020 Cancer Staging   Staging form: Breast, AJCC 8th Edition - Clinical: Stage IIIB (cT2, cN1, cM0, G3, ER-, PR-, HER2-) - Signed by BCammie Sickle MD on 05/07/2020       HISTORY OF PRESENTING ILLNESS:  Ashlee ArdBumgarner 70y.o.  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  Patient notes to have mild to moderate pain in the palms and soles.  Also notes to have mild redness with dry desquamation.  Also notes to have a rash in the bilateral upper extremities with itching.   She continues to have tingling and numbness in the extremities; obviously gotten worse with rash on palms and soles.  Review of Systems  Constitutional:  Positive for malaise/fatigue. Negative for chills, diaphoresis, fever and weight loss.  HENT:  Negative for nosebleeds and sore throat.   Eyes:  Negative for double vision.  Respiratory:  Negative for cough, hemoptysis, sputum production and wheezing.   Cardiovascular:  Negative for chest pain, palpitations, orthopnea and leg swelling.  Gastrointestinal:  Negative for abdominal pain, blood in stool, constipation and melena.  Genitourinary:  Negative for dysuria, frequency and urgency.  Musculoskeletal:  Negative for back pain and joint pain.  Skin:  Positive for itching and rash.  Neurological:  Positive for tingling. Negative for dizziness, focal weakness, weakness and headaches.  Endo/Heme/Allergies:  Does not bruise/bleed easily.  Psychiatric/Behavioral:  Negative for depression. The patient is not nervous/anxious and does not have insomnia.     MEDICAL HISTORY:  Past Medical History:  Diagnosis Date  . Cancer ([facility] 2021   right breast  .  Cyst, breast    benign  . Diabetes mellitus without complication ([place])   . Family history of melanoma   . Family history of ovarian cancer   . GERD (gastroesophageal reflux disease)    . H/O: rheumatic fever   . 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 Hyperlipidemia   . Hypertension   . Menopause    at age 58  . Personal history of chemotherapy 09/2019   RIGHT  INVASIVE MAMMARY CARCINOMA    SURGICAL HISTORY: Past Surgical History:  Procedure Laterality Date  . APPENDECTOMY  2006  . BREAST BIOPSY Right 09/22/2019   INVASIVE MAMMARY CARCINOMA  . BREAST CYST ASPIRATION Left 1999  . BREAST LUMPECTOMY Left 04/23/2020   surgery with NL and SN   . BREAST LUMPECTOMY WITH NEEDLE LOCALIZATION Right 04/23/2020   Procedure: BREAST LUMPECTOMY WITH NEEDLE LOCALIZATION;  Surgeon: Robert Bellow, MD;  Location: [facility];  Service: General;  Laterality: Right;  . BREAST LUMPECTOMY WITH SENTINEL LYMPH NODE BIOPSY Right 04/23/2020   Procedure: BREAST LUMPECTOMY WITH SENTINEL LYMPH NODE BX;  Surgeon: Robert Bellow, MD;  Location: [facility];  Service: General;  Laterality: Right;  . CHOLECYSTECTOMY  2006  . COLONOSCOPY    . PORTACATH PLACEMENT Left 10/06/2019   Procedure: INSERTION PORT-A-CATH;  Surgeon: Robert Bellow, MD;  Location: [facility];  Service: General;  Laterality: Left;  . SUBMUCOSAL TATTOO INJECTION Right 10/06/2019   Procedure: Right axillary TATTOO INJECTION;  Surgeon: Robert Bellow, MD;  Location: [facility];  Service: General;  Laterality: Right;  Marland Kitchen VAGINAL DELIVERY     x3    SOCIAL HISTORY: Social History   Socioeconomic History  . Marital status: Married    Spouse name: Chrissie Noa  . Number of children: 3  . Years of education: Not on file  . Highest education level: Not on file  Occupational History  . Occupation: Glass blower/designer: [id number]  Tobacco Use  . Smoking status: Former    Years: 1.00    Types: Cigarettes    Quit date: 12/26/2005    Years since quitting: 14.8  . Smokeless tobacco: Never  . Tobacco comments:    smoked for less than 1 years,  1 cig/day  Vaping Use  .  Vaping Use: Never used  Substance and Sexual Activity  . Alcohol use: No  . Drug use: No  . Sexual activity: Never    Partners: Female  Other Topics Concern  . Not on file  Social History Narrative   Widowed, March 2014; remarried.      [employer]; quit smoking 1ppw; no alcohol.       Social Determinants of Health   Financial Resource Strain: Not on file  Food Insecurity: Not on file  Transportation Needs: Not on file  Physical Activity: Not on file  Stress: Not on file  Social Connections: Not on file  Intimate Partner Violence: Not on file    FAMILY HISTORY: Family History  Problem Relation Age of Onset  . Cancer Mother 89       ovarian- died 4-5 years.   . Heart disease Mother 95  . Diabetes Mother   . Stroke Father 57  . Diabetes Father   . Diabetes Sister   . Melanoma Sister        survived  . Breast cancer Neg Hx     ALLERGIES:  has No Known  Allergies.  MEDICATIONS:  Current Outpatient Medications  Medication Sig Dispense Refill  . acetaminophen (TYLENOL) 500 MG tablet Take 500 mg by mouth every 8 (eight) hours as needed for moderate pain.    . Biotin 1000 MCG tablet Take 1,000 mcg by mouth daily.    Marland Kitchen gabapentin (NEURONTIN) 100 MG capsule Take 2 capsules (200 mg total) by mouth at bedtime. 60 capsule 6  . gabapentin (NEURONTIN) 100 MG capsule Take by mouth.    Marland Kitchen glipiZIDE (GLUCOTROL) 5 MG tablet Take 1 tablet (5 mg total) by mouth 2 (two) times daily before a meal. 60 tablet 1  . HYDROcodone-acetaminophen (NORCO/VICODIN) 5-325 MG tablet Take 1 tablet by mouth every 4 (four) hours as needed for moderate pain. 20 tablet 0  . losartan-hydrochlorothiazide (HYZAAR) 50-12.5 MG tablet Take 1 tablet by mouth daily. 90 tablet 3  . lovastatin (MEVACOR) 40 MG tablet TAKE 1 TABLET BY MOUTH AT BEDTIME 90 tablet 0  . metFORMIN (GLUCOPHAGE-XR) 500 MG 24 hr tablet Take 2 tablets (1,000 mg total) by mouth 2 (two) times daily. 120 tablet 0  . Multiple Vitamin  (MULTIVITAMIN) tablet Take 1 tablet by mouth daily.    Marland Kitchen omeprazole (PRILOSEC) 20 MG capsule Take 1 capsule by mouth once daily 90 capsule 0  . capecitabine (XELODA) 500 MG tablet Take 4 pills in AM: and 4 pills in PM- 2 weeks-ON and 1 week OFF (Patient not taking: No sig reported) 112 tablet 6   No current facility-administered medications for this visit.      Marland Kitchen  PHYSICAL EXAMINATION: ECOG PERFORMANCE STATUS: 0 - Asymptomatic  Vitals:   10/04/20 1046  BP: 135/71  Pulse: 87  Resp: 18  Temp: 97.9 F (36.6 C)  SpO2: 96%   Filed Weights   10/04/20 1046  Weight: 189 lb 9.6 oz (86 kg)    Physical Exam Constitutional:      Comments: Ambulating independently.  Accompanied by her husband.  HENT:     Head: Normocephalic and atraumatic.     Mouth/Throat:     Pharynx: No oropharyngeal exudate.  Eyes:     Pupils: Pupils are equal, round, and reactive to light.  Cardiovascular:     Rate and Rhythm: Normal rate and regular rhythm.  Pulmonary:     Effort: Pulmonary effort is normal. No respiratory distress.     Breath sounds: Normal breath sounds. No wheezing.  Abdominal:     General: Bowel sounds are normal. There is no distension.     Palpations: Abdomen is soft. There is no mass.     Tenderness: no abdominal tenderness There is no guarding or rebound.  Musculoskeletal:        General: No tenderness. Normal range of motion.     Cervical back: Normal range of motion and neck supple.  Skin:    General: Skin is warm.     Comments: Macular rash BIl UE; and foot syndrome soles/palms- bil  Neurological:     Mental Status: She is alert and oriented to person, place, and time.  Psychiatric:        Mood and Affect: Affect normal.     LABORATORY DATA:  I have reviewed the data as listed Lab Results  Component Value Date   WBC 4.7 10/04/2020   HGB 11.8 (L) 10/04/2020   HCT 36.3 10/04/2020   MCV 95.3 10/04/2020   PLT 156 10/04/2020   Recent Labs    12/02/19 4818  12/09/19 0844 12/23/19 5631 12/30/19 4970 08/30/20 2637  09/20/20 1010 10/04/20 1024  NA 144 140 140   < > 136 135 138  K 3.4* 3.3* 3.3*   < > 3.7 3.4* 3.8  CL 106 104 104   < > 102 99 105  CO2 _0 < > _1 GLUCOSE 194* 204* 237*   < > 264* 184* 116*  BUN _2 < > _3 CREATININE 0.68 0.79 0.77   < > 0.75 0.79 0.82  CALCIUM 9.1 8.9 9.1   < > 8.9 9.3 9.7  GFRNONAA >60 >60 >60   < > >60 >60 >60  GFRAA >60 >60 >60  --   --   --   --   PROT  --   --  6.7   < > 7.2 7.1 7.6  ALBUMIN  --   --  3.8   < > 3.7 3.7 3.8  AST  --   --  56*   < > 129* 101* 72*  ALT  --   --  43   < > 75* 55* 42  ALKPHOS  --   --  66   < > 97 110 106  BILITOT  --   --  0.6   < > 0.8 1.5* 0.7   < > = values in this interval not displayed.    RADIOGRAPHIC STUDIES: I have personally reviewed the radiological images as listed and agreed with the findings in the report. No results found.  ASSESSMENT & PLAN:   Carcinoma of upper-inner quadrant of right breast in female, estrogen receptor negative ([place]) # Right breast cancer-Stage IIB- T2N1-TRIPLE NEGATIVE;  s/p neoadjuvant therapy carbo platinum-Taxol; Adriamycin Cytoxan- ypT1ypN1.  Currently s/p adjuvant radiation.  #Currently on Xeloda 1000 mg per metered squared [twice daily; 2 weeks on 1 week off for 6-8 cycles];cut down the dose to 1500 mg BID[sec to HFS- see below].start cyle #3 tomorrow.   # Hand foot syndrome-Xeloda-grade 2-3; improved-[decrease dose of Xeloda-see above];-continue Cerva Ve SA; add urea 40% twice a day.  # PN-1-2 sec to taxol-on Neurontin 200 qhs- STABLE.   # DM-continue metformin 1000 mg BID; FBS- 116; ; add glucotrol follow-up with PCP  # Mediport: port flush every 2 to 3 months.  # [place] vacation/ sep 2022 _4   # DISPOSITION: # follow up in 3 weeks- MD; labs- cbc/cmp; -Dr.B   All questions were answered. The patient/family knows to call the clinic with any problems, questions or concerns.   Cammie Sickle, MD 10/10/2020 10:37 PM

## 2020-10-04 NOTE — Assessment & Plan Note (Addendum)
#   Right breast cancer-Stage IIB- T2N1-TRIPLE NEGATIVE;  s/p neoadjuvant therapy carbo platinum-Taxol; Adriamycin Cytoxan- ypT1ypN1.  Currently s/p adjuvant radiation. Currently on adjuvant xeloda.   #Currently on Xeloda 1000 mg per metered squared [twice daily; 2 weeks on 1 week off for 6-8 cycles];cut down the dose to 1500 mg BID[sec to HFS- see below].start cyle #3 tomorrow.   # Hand foot syndrome-Xeloda-grade 2-3; improved-[decrease dose of Xeloda-see above];-continue Cerva Ve SA; add urea 40% twice a day.  # PN-1-2 sec to taxol-on Neurontin 200 qhs- STABLE.   # DM-continue metformin 1000 mg BID; FBS- 116; ; add glucotrol follow-up with PCP  # Mediport: port flush every 2 to 3 months.  # [place] vacation/ sep 2022 []   # DISPOSITION: # follow up in 3 weeks- MD; labs- cbc/cmp; -Dr.B

## 2020-10-10 ENCOUNTER — Encounter: Payer: Self-pay | Admitting: Internal Medicine

## 2020-10-11 ENCOUNTER — Other Ambulatory Visit: Payer: Self-pay | Admitting: Internal Medicine

## 2020-10-11 DIAGNOSIS — E113559 Type 2 diabetes mellitus with stable proliferative diabetic retinopathy, unspecified eye: Secondary | ICD-10-CM

## 2020-10-13 ENCOUNTER — Other Ambulatory Visit: Payer: Self-pay

## 2020-10-13 ENCOUNTER — Ambulatory Visit (INDEPENDENT_AMBULATORY_CARE_PROVIDER_SITE_OTHER): Payer: PPO | Admitting: Internal Medicine

## 2020-10-13 ENCOUNTER — Encounter: Payer: Self-pay | Admitting: Internal Medicine

## 2020-10-13 VITALS — BP 124/70 | HR 87 | Temp 96.3°F | Resp 15 | Ht 67.0 in | Wt 189.8 lb

## 2020-10-13 DIAGNOSIS — E669 Obesity, unspecified: Secondary | ICD-10-CM

## 2020-10-13 DIAGNOSIS — E1159 Type 2 diabetes mellitus with other circulatory complications: Secondary | ICD-10-CM | POA: Diagnosis not present

## 2020-10-13 DIAGNOSIS — I152 Hypertension secondary to endocrine disorders: Secondary | ICD-10-CM | POA: Diagnosis not present

## 2020-10-13 DIAGNOSIS — E113559 Type 2 diabetes mellitus with stable proliferative diabetic retinopathy, unspecified eye: Secondary | ICD-10-CM

## 2020-10-13 DIAGNOSIS — R7989 Other specified abnormal findings of blood chemistry: Secondary | ICD-10-CM

## 2020-10-13 DIAGNOSIS — E1169 Type 2 diabetes mellitus with other specified complication: Secondary | ICD-10-CM

## 2020-10-13 DIAGNOSIS — Z6831 Body mass index (BMI) 31.0-31.9, adult: Secondary | ICD-10-CM

## 2020-10-13 DIAGNOSIS — H532 Diplopia: Secondary | ICD-10-CM | POA: Diagnosis not present

## 2020-10-13 LAB — POCT GLYCOSYLATED HEMOGLOBIN (HGB A1C): Hemoglobin A1C: 6.8 % — AB (ref 4.0–5.6)

## 2020-10-13 LAB — MICROALBUMIN / CREATININE URINE RATIO
Creatinine,U: 207 mg/dL
Microalb Creat Ratio: 0.4 mg/g (ref 0.0–30.0)
Microalb, Ur: 0.9 mg/dL (ref 0.0–1.9)

## 2020-10-13 MED ORDER — METFORMIN HCL ER (MOD) 1000 MG PO TB24: 2000.0000 mg | ORAL_TABLET | Freq: Every day | ORAL | 1 refills | Status: DC

## 2020-10-13 NOTE — Progress Notes (Signed)
Patient ID: Ashlee Cox, female    DOB: May 29, 1950  Age: 70 y.o. MRN: [id number]  The patient is here for follow up and  management of other chronic and acute problems.  This visit occurred during the SARS-CoV-2 public health emergency.  Safety protocols were in place, including screening questions prior to the visit, additional usage of staff PPE, and extensive cleaning of exam room while observing appropriate contact time as indicated for disinfecting solutions.     The risk factors are reflected in the social history.  The roster of all physicians providing medical care to patient - is listed in the Snapshot section of the chart.  Activities of daily living:  The patient is 100% independent in all ADLs: dressing, toileting, feeding as well as independent mobility  Home safety : The patient has smoke detectors in the home. They wear seatbelts.  There are no firearms at home. There is no violence in the home.   There is no risks for hepatitis, STDs or HIV. There is no   history of blood transfusion. They have no travel history to infectious disease endemic areas of the world.  The patient has seen their dentist in the last six month. They have seen their eye doctor in the last year. They admit to slight hearing difficulty with regard to whispered voices and some television programs.  They have deferred audiologic testing in the last year.  They do not  have excessive sun exposure. Discussed the need for sun protection: hats, long sleeves and use of sunscreen if there is significant sun exposure.   Diet: the importance of a healthy diet is discussed. They do have a healthy diet.  The benefits of regular aerobic exercise were discussed. She walks 4 times per week ,  20 minutes.   Depression screen: there are no signs or vegative symptoms of depression- irritability, change in appetite, anhedonia, sadness/tearfullness.  Cognitive assessment: the patient manages all their financial and  personal affairs and is actively engaged. They could relate day,date,year and events; recalled 2/3 objects at 3 minutes; performed clock-face test normally.  The following portions of the patient's history were reviewed and updated as appropriate: allergies, current medications, past family history, past medical history,  past surgical history, past social history  and problem list.  Visual acuity was not assessed per patient preference since she has regular follow up with her ophthalmologist. Hearing and body mass index were assessed and reviewed.   During the course of the visit the patient was educated and counseled about appropriate screening and preventive services including : fall prevention , diabetes screening, nutrition counseling, colorectal cancer screening, and recommended immunizations.    CC: The primary encounter diagnosis was Obesity, diabetes, and hypertension syndrome ([name]). 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  1) Breast cancer Stage IIIB:  triple negative , right breast:  diagnosed Jan 2022,  s/p lumpectomy ( Feb 2022 ).  She has finished XRT and chemo and is now taking Capecitabine.  Recent desquamation of palms, soles and peripheral neuropathy.  Soles of  feet hurting . Using gabapentin to sleep .  Has had episodes of double vision that occur after reading for over an hour. .has not had eye exam because the [facility] optometrist  has retired. Needs [facility] referral  2) type 2 DM:  oncology recent added glucotrol  for random cbg  248.  No a1c done, but most recent was < 7.0 in June.  Has been checking sugars  infrequently , but notes that her readings have responded quickly ,  after one week  fasting sugar was 116 . Does not walk barefoot.     Lab Results  Component Value Date   HGBA1C 6.8 (A) 10/13/2020     History Ashlee Cox has a past medical history of Cancer ([place]) (2021), Cyst, breast, Diabetes mellitus without complication ([place]), Family history of melanoma, Family history of ovarian cancer, GERD (gastroesophageal reflux disease), H/O: rheumatic fever, History of colonoscopy (2010), Hyperlipidemia, Hypertension, Menopause, and Personal history of chemotherapy (09/2019).   She has a past surgical history that includes Appendectomy (2006); Cholecystectomy (2006); Vaginal delivery; Breast cyst aspiration (Left, 1999); Portacath placement (Left, 10/06/2019); Submucosal tattoo injection (Right, 10/06/2019); Breast biopsy (Right, 09/22/2019); Colonoscopy; Breast lumpectomy (Left, 04/23/2020); Breast lumpectomy with sentinel lymph node bx (Right, 04/23/2020); and Breast lumpectomy with needle localization (Right, 04/23/2020).   Her family history includes Cancer (age of onset: 3) in her mother; Diabetes in her father, mother, and sister; Heart disease (age of onset: 39) in her mother; Melanoma in her sister; Stroke (age of onset: 43) in her father.She reports that she quit smoking about 14 years ago. Her smoking use included cigarettes. She has never used smokeless tobacco. She reports that she does not drink alcohol and does not use drugs.  Outpatient Medications Prior to Visit  Medication Sig Dispense Refill   acetaminophen (TYLENOL) 500 MG tablet Take 500 mg by mouth every 8 (eight) hours as needed for moderate pain.     Biotin 1000 MCG tablet Take 1,000 mcg by mouth daily.     capecitabine (XELODA) 500 MG tablet Take 4 pills in AM: and 4 pills in PM- 2 weeks-ON and 1 week OFF 112 tablet 6   gabapentin (NEURONTIN) 100 MG capsule Take 2 capsules (200 mg total) by mouth at bedtime. 60 capsule 6   glipiZIDE (GLUCOTROL) 5 MG tablet Take 1 tablet (5 mg total) by mouth 2 (two) times daily  before a meal. 60 tablet 1   losartan-hydrochlorothiazide (HYZAAR) 50-12.5 MG tablet Take 1 tablet by mouth once daily 90 tablet 0   lovastatin (MEVACOR) 40 MG tablet TAKE 1 TABLET BY MOUTH AT BEDTIME 90 tablet 0   Multiple Vitamin (MULTIVITAMIN) tablet Take 1 tablet by mouth daily.     omeprazole (PRILOSEC) 20 MG capsule Take 1 capsule by mouth once daily 90 capsule 0   HYDROcodone-acetaminophen (NORCO/VICODIN) 5-325 MG tablet Take 1 tablet by mouth every 4 (four) hours as needed for moderate pain. 20 tablet 0   metFORMIN (GLUCOPHAGE-XR) 500 MG 24 hr tablet Take 2 tablets (1,000 mg total) by mouth 2 (two) times daily. 120 tablet 0   gabapentin (NEURONTIN) 100 MG capsule Take by mouth. (Patient not taking: Reported on 10/13/2020)     No facility-administered medications prior to visit.    Review of Systems  Patient denies headache, fevers, malaise, unintentional weight loss, skin rash, eye pain, sinus congestion and sinus pain, sore throat, dysphagia,  hemoptysis , cough, dyspnea, wheezing, chest pain, palpitations, orthopnea, edema, abdominal pain, nausea, melena, diarrhea, constipation, flank pain, dysuria, hematuria, urinary  Frequency, nocturia, seizures,  Focal weakness, Loss of consciousness,  Tremor, insomnia, depression, anxiety, and suicidal ideation.     Objective:  BP 124/70 (BP Location: Left  Arm, Patient Position: Sitting, Cuff Size: Large)   Pulse 87   Temp (!) 96.3 F (35.7 C) (Temporal)   Resp 15   Ht 5\' 7"  (1.702 m)   Wt 189 lb 12.8 oz (86.1 kg)   SpO2 96%   BMI 29.73 kg/m   Physical Exam  General appearance: alert, cooperative and appears stated age Ears: normal TM's and external ear canals both ears Throat: lips, mucosa, and tongue normal; teeth and gums normal Neck: no adenopathy, no carotid bruit, supple, symmetrical, trachea midline and thyroid not enlarged, symmetric, no tenderness/mass/nodules Back: symmetric, no curvature. ROM normal. No CVA  tenderness. Lungs: clear to auscultation bilaterally Heart: regular rate and rhythm, S1, S2 normal, no murmur, click, rub or gallop Abdomen: soft, non-tender; bowel sounds normal; no masses,  no organomegaly Pulses: 2+ and symmetric Skin: Skin color, texture, turgor normal. No rashes or lesions Lymph nodes: Cervical, supraclavicular, and axillary nodes normal.  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  Continue metformin, glipizide, losartan  and lovastatin .   Lab Results  Component Value Date   HGBA1C 6.8 (A) 10/13/2020   Lab Results  Component Value Date   MICROALBUR 0.9 10/13/2020   MICROALBUR 0.9 04/24/2019           Relevant Medications   metFORMIN (GLUMETZA) 1000 MG (MOD) 24 hr tablet   RESOLVED: Obesity, diabetes, and hypertension syndrome (HCC) - Primary   Relevant Medications   metFORMIN (GLUMETZA) 1000 MG (MOD) 24 hr tablet   Other Relevant Orders   POCT HgB A1C (Completed)   Microalbumin / creatinine urine ratio (Completed)   Lipid panel   RESOLVED: Obesity    age appropriate education and counseling updated, referrals for preventative services and immunizations addressed, dietary and smoking counseling addressed, most recent labs reviewed.  I have personally reviewed and have noted:   1) the patient's medical and social history 2) The pt's use of alcohol, tobacco, and illicit drugs 3) The patient's current medications and supplements 4) Functional ability including ADL's, fall risk, home safety risk, hearing and visual impairment 5) Diet and physical activities 6) Evidence for depression or mood disorder 7) The patient's height, weight, and BMI have been recorded in the chart   I have made referrals, and provided counseling and education based  on review of the above       Relevant Medications   metFORMIN (GLUMETZA) 1000 MG (MOD) 24 hr tablet   Elevated LFTs    Persistent  , likely due to fatty liver .  Ultrasound ordered.  Lab Results  Component Value Date   ALT 42 10/04/2020   AST 72 (H) 10/04/2020   ALKPHOS 106 10/04/2020   BILITOT 0.7 10/04/2020         Relevant Orders   US Abdomen Limited RUQ (LIVER/GB)   Alkaline phosphatase, isoenzymes   Alpha-1-antitrypsin   ANA   AntiMicrosomal Ab-Liver / Kidney   Anti-Smith antibody   Anti-smooth muscle antibody, IgG   Ceruloplasmin   Hepatitis B core antibody, total   Hepatitis B surface antibody,qualitative   Hepatitis B surface antigen   Hepatitis C antibody   Mitochondrial antibodies   Other Visit Diagnoses     Diplopia       Relevant Orders   Ambulatory  referral to Ophthalmology       I have discontinued Jeanifer A. Senkbeil's HYDROcodone-acetaminophen. I have also changed her metFORMIN. Additionally, I am having her maintain her multivitamin, acetaminophen, Biotin, capecitabine, gabapentin, lovastatin, glipiZIDE, omeprazole, and losartan-hydrochlorothiazide.  Meds ordered this encounter  Medications   metFORMIN (GLUMETZA) 1000 MG (MOD) 24 hr tablet    Sig: Take 2 tablets (2,000 mg total) by mouth daily with breakfast.    Dispense:  180 tablet    Refill:  1    Medications Discontinued During This Encounter  Medication Reason   gabapentin (NEURONTIN) 676 MG capsule Duplicate   metFORMIN (GLUCOPHAGE-XR) 500 MG 24 hr tablet    HYDROcodone-acetaminophen (NORCO/VICODIN) 5-325 MG tablet    A total of 40 minutes was spent with patient more than half of which was spent in counseling patient on  diabetes,  elevated liver enzyme, reviewing and explaining recent labs and imaging studies done, and coordination of care.  Follow-up: No follow-ups on file.   [name], MD

## 2020-10-13 NOTE — Patient Instructions (Addendum)
Here are some low carb salty  snacks:   Whisps:  all cheese crunchy croutons; LOOK IN THE SALAD SECTION Ashlee Cox WITH HOME MADE [facility]  Your a1c is fine at 6.8 . This may mean that the glipizide will have to be lowered  to 2.5 mg  or stopped     I have refilled the metformin ER .  The pill is 1000 mg..  take 2 pills daily with breakfast  The most informative times to check blood sugars are either 1) fasting or 2) 2 hours after a meal (post prandial).  If your fastings are consistently < 130 for a week,  change your testing time to postprandial,  and target a given meal for 3 days in a row before switching to another time.

## 2020-10-14 ENCOUNTER — Encounter: Payer: Self-pay | Admitting: Internal Medicine

## 2020-10-16 NOTE — Assessment & Plan Note (Signed)
Persistent  , likely due to fatty liver .  Ultrasound ordered.  Lab Results  Component Value Date   ALT 42 10/04/2020   AST 72 (H) 10/04/2020   ALKPHOS 106 10/04/2020   BILITOT 0.7 10/04/2020

## 2020-10-16 NOTE — Assessment & Plan Note (Addendum)
Diabetes management is at goal on metformin and glipizide.  Continue metformin, glipizide, losartan  and lovastatin .   Lab Results  Component Value Date   HGBA1C 6.8 (A) 10/13/2020   Lab Results  Component Value Date   MICROALBUR 0.9 10/13/2020   MICROALBUR 0.9 04/24/2019

## 2020-10-16 NOTE — Assessment & Plan Note (Signed)

## 2020-10-17 ENCOUNTER — Other Ambulatory Visit: Payer: Self-pay | Admitting: Internal Medicine

## 2020-10-17 DIAGNOSIS — E113559 Type 2 diabetes mellitus with stable proliferative diabetic retinopathy, unspecified eye: Secondary | ICD-10-CM

## 2020-10-18 ENCOUNTER — Encounter: Payer: Self-pay | Admitting: Internal Medicine

## 2020-10-19 ENCOUNTER — Other Ambulatory Visit: Payer: Self-pay

## 2020-10-19 ENCOUNTER — Telehealth: Payer: Self-pay | Admitting: Internal Medicine

## 2020-10-19 MED ORDER — METFORMIN HCL 1000 MG PO TABS
ORAL_TABLET | ORAL | 1 refills | Status: DC
Start: 2020-10-19 — End: 2021-04-13

## 2020-10-19 NOTE — Telephone Encounter (Signed)
Called and spoke with [place] at [facility]. She states that Glumetza was sent in instead of the regular metformin. Marcial Pacas is expensive and is not covered by insurance. Alyse Low states that Glucophage ER was the normal metformin that is covered and should be sent in instead of the [employer]. Ok to change medication?

## 2020-10-19 NOTE — Telephone Encounter (Signed)
Metformin Glucophage '1000mg'$  has been sent to the pharmacy with [id number] 2 tablets (2,000 mg total) by mouth daily with breakfast with 180 tablets and 1 refill.

## 2020-10-19 NOTE — Telephone Encounter (Signed)
Pt has been notified and will call the pharmacy to see when medication has been filled.

## 2020-10-19 NOTE — Telephone Encounter (Signed)
Pt called an said that she went to pick up her metFORMIN (GLUMETZA) 1000 MG (MOD) 24 hr tablet at [facility]. They told her that the cost was $21k a month

## 2020-10-25 ENCOUNTER — Inpatient Hospital Stay: Payer: PPO | Attending: Internal Medicine

## 2020-10-25 ENCOUNTER — Inpatient Hospital Stay (HOSPITAL_BASED_OUTPATIENT_CLINIC_OR_DEPARTMENT_OTHER): Payer: PPO | Admitting: Internal Medicine

## 2020-10-25 ENCOUNTER — Encounter: Payer: Self-pay | Admitting: Internal Medicine

## 2020-10-25 ENCOUNTER — Other Ambulatory Visit: Payer: Self-pay

## 2020-10-25 DIAGNOSIS — E119 Type 2 diabetes mellitus without complications: Secondary | ICD-10-CM | POA: Insufficient documentation

## 2020-10-25 DIAGNOSIS — C50211 Malignant neoplasm of upper-inner quadrant of right female breast: Secondary | ICD-10-CM | POA: Diagnosis not present

## 2020-10-25 DIAGNOSIS — Z171 Estrogen receptor negative status [ER-]: Secondary | ICD-10-CM | POA: Insufficient documentation

## 2020-10-25 LAB — CBC WITH DIFFERENTIAL/PLATELET
Abs Immature Granulocytes: 0.01 10*3/uL (ref 0.00–0.07)
Basophils Absolute: 0 10*3/uL (ref 0.0–0.1)
Basophils Relative: 1 %
Eosinophils Absolute: 0.2 10*3/uL (ref 0.0–0.5)
Eosinophils Relative: 4 %
HCT: 34.6 % — ABNORMAL LOW (ref 36.0–46.0)
Hemoglobin: 11.7 g/dL — ABNORMAL LOW (ref 12.0–15.0)
Immature Granulocytes: 0 %
Lymphocytes Relative: 26 %
Lymphs Abs: 0.9 10*3/uL (ref 0.7–4.0)
MCH: 32 pg (ref 26.0–34.0)
MCHC: 33.8 g/dL (ref 30.0–36.0)
MCV: 94.5 fL (ref 80.0–100.0)
Monocytes Absolute: 0.4 10*3/uL (ref 0.1–1.0)
Monocytes Relative: 12 %
Neutro Abs: 2 10*3/uL (ref 1.7–7.7)
Neutrophils Relative %: 57 %
Platelets: 131 10*3/uL — ABNORMAL LOW (ref 150–400)
RBC: 3.66 MIL/uL — ABNORMAL LOW (ref 3.87–5.11)
RDW: 19.9 % — ABNORMAL HIGH (ref 11.5–15.5)
WBC: 3.5 10*3/uL — ABNORMAL LOW (ref 4.0–10.5)
nRBC: 0 % (ref 0.0–0.2)

## 2020-10-25 LAB — COMPREHENSIVE METABOLIC PANEL
ALT: 65 U/L — ABNORMAL HIGH (ref 0–44)
AST: 91 U/L — ABNORMAL HIGH (ref 15–41)
Albumin: 3.9 g/dL (ref 3.5–5.0)
Alkaline Phosphatase: 77 U/L (ref 38–126)
Anion gap: 13 (ref 5–15)
BUN: 15 mg/dL (ref 8–23)
CO2: 21 mmol/L — ABNORMAL LOW (ref 22–32)
Calcium: 9.2 mg/dL (ref 8.9–10.3)
Chloride: 103 mmol/L (ref 98–111)
Creatinine, Ser: 0.74 mg/dL (ref 0.44–1.00)
GFR, Estimated: >60 mL/min (ref >60)
Glucose, Bld: 132 mg/dL — ABNORMAL HIGH (ref 70–99)
Potassium: 3.4 mmol/L — ABNORMAL LOW (ref 3.5–5.1)
Sodium: 137 mmol/L (ref 135–145)
Total Bilirubin: 0.7 mg/dL (ref 0.3–1.2)
Total Protein: 7.1 g/dL (ref 6.5–8.1)

## 2020-10-25 NOTE — Progress Notes (Signed)
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 Byrnett.] [id number] Carbo-taxol-AC;  JAN 2022-s/p Lumpectomy & ALND- [ypT1a (71m ) ypN1 (2/11 LN-positive)]; s/p radiation end of April 2022.  #Early June 2022-Xeloda 1000 mg per metered square [2000] 2 weeks on 1 week of 6-8 cycles; July 11th 2022- STARTING cycle #3-cut down the dose to 1500 mg BID[sec to HFS].  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 No pathogenic variants identified on the [facility] Common Hereditary Cancers Panel + Skin Cancers Panel. The report date is 12/04/2019.  The Common Hereditary Cancers Panel + Skin Cancers Panel offered by [facility] includes sequencing and/or deletion duplication testing of the following 54 genes: APC*, ATM*, AXIN2, BAP1, BARD1, BMPR1A, BRCA1, BRCA2, BRIP1, CDH1, CDK4, CDKN2A (p14ARF), CDKN2A (p16INK4a), CHEK2, CTNNA1, DICER1*, EPCAM*, GREM1*, HOXB13, KIT, MEN1*, MITF*, MLH1*, MSH2*, MSH3*, MSH6*, MUTYH, NBN, NF1*, NTHL1, PALB2, PDGFRA, PMS2*, POLD1*, POLE, POT1, PTCH1, PTEN*, RAD50, RAD51C, RAD51D, RB1*, RNF43, SDHA*, SDHB, SDHC*, SDHD,  SMAD4, SMARCA4, STK11, SUFU, TP53, TSC1*, TSC2, VHL.    05/07/2020 Cancer Staging   Staging form: Breast, AJCC 8th Edition - Clinical: Stage IIIB (cT2, cN1, cM0, G3, ER-, PR-, HER2-) - Signed by BCammie Sickle MD on 05/07/2020       HISTORY OF PRESENTING ILLNESS:  Ashlee ArdBumgarner 70y.o.  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  Patient also has significant provement of the hand-foot syndrome on reduced dose of Xeloda [1500 mg twice daily].   Denies any further desquamation.  Denies any pain or rawness.   Chronic mild tingling and numbness in extremities.  Review of Systems  Constitutional:  Positive for malaise/fatigue. Negative for chills, diaphoresis, fever and weight loss.  HENT:  Negative for nosebleeds and sore throat.   Eyes:  Negative for double vision.  Respiratory:  Negative for cough, hemoptysis, sputum production and wheezing.   Cardiovascular:  Negative for chest pain, palpitations, orthopnea and leg swelling.  Gastrointestinal:  Negative for abdominal pain, blood in stool, constipation and melena.  Genitourinary:  Negative for dysuria, frequency and urgency.  Musculoskeletal:  Negative for back pain and joint pain.  Skin:  Positive for itching and rash.  Neurological:  Positive for tingling. Negative for dizziness, focal weakness, weakness and headaches.  Endo/Heme/Allergies:  Does not bruise/bleed easily.  Psychiatric/Behavioral:  Negative for depression. The patient is not nervous/anxious and does not have insomnia.     MEDICAL HISTORY:  Past Medical History:  Diagnosis Date   Cancer ([facility] 2021   right breast   Cyst, breast    benign   Di[place] mellitus [place] complication [place]    Family history of  melanoma    Family history of ovarian cancer    GERD (gastroesophageal reflux disease)    H/O: rheumatic fever    History of colonoscopy 2010   done bc of bleeding,  normal ,  due back in 5 yrs ([place])   Hyperlipidemia    Hypertension    Menopause    at age 82   Personal history of chemotherapy 09/2019   RIGHT  INVASIVE MAMMARY CARCINOMA    SURGICAL HISTORY: Past Surgical History:  Procedure Laterality Date   APPENDECTOMY  2006   BREAST BIOPSY Right 09/22/2019   INVASIVE MAMMARY CARCINOMA   BREAST CYST ASPIRATION Left 1999   BREAST LUMPECTOMY Left 04/23/2020   surgery with NL and SN    BREAST LUMPECTOMY WITH NEEDLE LOCALIZATION Right 04/23/2020   Procedure: BREAST LUMPECTOMY WITH NEEDLE LOCALIZATION;  Surgeon: Robert Bellow, MD;  Location: [facility] ORS;  Service: General;  Laterality: Right;   BREAST LUMPECTOMY WITH SENTINEL LYMPH NODE BIOPSY Right 04/23/2020   Procedure: BREAST LUMPECTOMY WITH SENTINEL LYMPH NODE BX;  Surgeon: Robert Bellow, MD;  Location: [facility] ORS;  Service: General;  Laterality: Right;   CHOLECYSTECTOMY  2006   COLONOSCOPY     PORTACATH PLACEMENT Left 10/06/2019   Procedure: INSERTION PORT-A-CATH;  Surgeon: Robert Bellow, MD;  Location: [facility] ORS;  Service: General;  Laterality: Left;   SUBMUCOSAL TATTOO INJECTION Right 10/06/2019   Procedure: Right axillary TATTOO INJECTION;  Surgeon: Robert Bellow, MD;  Location: [facility] ORS;  Service: General;  Laterality: Right;   VAGINAL DELIVERY     x3    SOCIAL HISTORY: Social History   Socioeconomic History   Marital status: Married    Spouse name: Chrissie Noa   Number of children: 3   Years of education: Not on file   Highest education level: Not on file  Occupational History   Occupation: Glass blower/designer: [id number]  Tobacco Use   Smoking status: Former    Years: 1.00    Types: Cigarettes    Quit date: 12/26/2005    Years since quitting: 14.8   Smokeless tobacco: Never   Tobacco comments:    smoked for less than 1 years,  1 cig/day  Vaping Use   Vaping Use: Never used  Substance and Sexual Activity   Alcohol use: No   Drug use: No   Sexual activity: Never     Partners: Female  Other Topics Concern   Not on file  Social History Narrative   Widowed, March 2014; remarried.      [employer] ret[employer]; quit smoking 1ppw; no alcohol.       Social Determinants of Health   Financial Resource Strain: Not on file  Food Insecurity: Not on file  Transportation Needs: Not on file  Physical Activity: Not on file  Stress: Not on file  Social Connections: Not on file  Intimate Partner Violence: Not on file    FAMILY HISTORY: Family History  Problem Relation Age of Onset   Cancer Mother 36       ovarian- died 4-5 years.    Heart disease Mother 37   Diabetes Mother    Stroke Father 12   Diabetes Father    Diabetes Sister    Melanoma Sister        survived   Breast cancer Neg Hx     ALLERGIES:  has No Known Allergies.  MEDICATIONS:  Current Outpatient Medications  Medication Sig Dispense Refill   acetaminophen (TYLENOL) 500 MG tablet  Take 500 mg by mouth every 8 (eight) hours as needed for moderate pain.     Biotin 1000 MCG tablet Take 1,000 mcg by mouth daily.     capecitabine (XELODA) 500 MG tablet Take 4 pills in AM: and 4 pills in PM- 2 weeks-ON and 1 week OFF 112 tablet 6   gabapentin (NEURONTIN) 100 MG capsule Take 2 capsules (200 mg total) by mouth at bedtime. 60 capsule 6   glipiZIDE (GLUCOTROL) 5 MG tablet Take 1 tablet (5 mg total) by mouth 2 (two) times daily before a meal. 60 tablet 1   losartan-hydrochlorothiazide (HYZAAR) 50-12.5 MG tablet Take 1 tablet by mouth once daily 90 tablet 0   lovastatin (MEVACOR) 40 MG tablet TAKE 1 TABLET BY MOUTH AT BEDTIME 90 tablet 0   metFORMIN (GLUCOPHAGE) 1000 MG tablet Take 2 tablets (2,000 mg total) by mouth daily with breakfast 180 tablet 1   Multiple Vitamin (MULTIVITAMIN) tablet Take 1 tablet by mouth daily.     omeprazole (PRILOSEC) 20 MG capsule Take 1 capsule by mouth once daily 90 capsule 0   No current facility-administered medications for this visit.      Marland Kitchen  PHYSICAL  EXAMINATION: ECOG PERFORMANCE STATUS: 0 - Asymptomatic  Vitals:   10/25/20 1056  BP: 137/73  Pulse: 85  Resp: 16  Temp: 99 F (37.2 C)  SpO2: 99%   Filed Weights   10/25/20 1056  Weight: 192 lb 3.2 oz (87.2 kg)    Physical Exam Constitutional:      Comments: Ambulating independently.  Alone.  HENT:     Head: Normocephalic and atraumatic.     Mouth/Throat:     Pharynx: No oropharyngeal exudate.  Eyes:     Pupils: Pupils are equal, round, and reactive to light.  Cardiovascular:     Rate and Rhythm: Normal rate and regular rhythm.  Pulmonary:     Effort: Pulmonary effort is normal. No respiratory distress.     Breath sounds: Normal breath sounds. No wheezing.  Abdominal:     General: Bowel sounds are normal. There is no distension.     Palpations: Abdomen is soft. There is no mass.     Tenderness: There is no abdominal tenderness. There is no guarding or rebound.  Musculoskeletal:        General: No tenderness. Normal range of motion.     Cervical back: Normal range of motion and neck supple.  Skin:    General: Skin is warm.     Comments: Significant improvement of the rash on palms and soles.  Mild desquamation noted.  Neurological:     Mental Status: She is alert and oriented to person, place, and time.  Psychiatric:        Mood and Affect: Affect normal.     LABORATORY DATA:  I have reviewed the data as listed Lab Results  Component Value Date   WBC 3.5 (L) 10/25/2020   HGB 11.7 (L) 10/25/2020   HCT 34.6 (L) 10/25/2020   MCV 94.5 10/25/2020   PLT 131 (L) 10/25/2020   Recent Labs    12/02/19 0922 12/09/19 0844 12/23/19 0846 12/30/19 0839 09/20/20 1010 10/04/20 1024 10/25/20 1030  NA 144 140 140   < > 135 138 137  K 3.4* 3.3* 3.3*   < > 3.4* 3.8 3.4*  CL 106 104 104   < > 99 105 103  CO2 26 23 24    < > 24 25 21*  GLUCOSE 194* 204* 237*   < >  184* 116* 132*  BUN 10 13 10    < > 13 12 15   CREATININE 0.68 0.79 0.77   < > 0.79 0.82 0.74  CALCIUM 9.1  8.9 9.1   < > 9.3 9.7 9.2  GFRNONAA >60 >60 >60   < > >60 >60 >60  GFRAA >60 >60 >60  --   --   --   --   PROT  --   --  6.7   < > 7.1 7.6 7.1  ALBUMIN  --   --  3.8   < > 3.7 3.8 3.9  AST  --   --  56*   < > 101* 72* 91*  ALT  --   --  43   < > 55* 42 65*  ALKPHOS  --   --  66   < > 110 106 77  BILITOT  --   --  0.6   < > 1.5* 0.7 0.7   < > = values in this interval not displayed.    RADIOGRAPHIC STUDIES: I have personally reviewed the radiological images as listed and agreed with the findings in the report. No results found.  ASSESSMENT & PLAN:   Carcinoma of upper-inner quadrant of right breast in female, estrogen receptor negative ([place]) # Right breast cancer-Stage IIB- T2N1-TRIPLE NEGATIVE;  s/p neoadjuvant therapy carbo platinum-Taxol; Adriamycin Cytoxan- ypT1ypN1. s/p adjuvant radiation. Currently on adjuvant xeloda. STABLE.  #Currently on Xeloda 1000 mg per metered squared [twice daily; 2 weeks on 1 week off for 6-8 cycles];cut down the dose to 1500 mg BID [sec to HFS- see below]. start cyle #4 tomorrow.   # Hand foot syndrome-Xeloda-grade 1-; improved-[decrease dose of Xeloda-see above];-continue Cerva Ve SA; add urea 40% twice a day. STABLE.   # PN-1-2 sec to taxol-on Neurontin 200 qhs- STABLE.   #Mild LFT abomirnality-suspect fatty liver versus chemotherapy.  Awaiting [place] [PCP]  # DM- continue metformin 2000 mg q AM [Dr.Tullo]; PBF- 132; on glipizide 5 mg BID. .   # Mediport: port flush every 2 to 3 months.  # [place] vacation/ last sep 2022.  # DISPOSITION: # follow up in 3 weeks- MD; labs- cbc/cmp; -Dr.B   All questions were answered. The patient/family knows to call the clinic with any problems, questions or concerns.   Cammie Sickle, MD 10/25/2020 11:26 PM

## 2020-10-25 NOTE — Assessment & Plan Note (Addendum)
#   Right breast cancer-Stage IIB- T2N1-TRIPLE NEGATIVE;  s/p neoadjuvant therapy carbo platinum-Taxol; Adriamycin Cytoxan- ypT1ypN1. s/p adjuvant radiation. Currently on adjuvant xeloda. STABLE.  #Currently on Xeloda 1000 mg per metered squared [twice daily; 2 weeks on 1 week off for 6-8 cycles];cut down the dose to 1500 mg BID [sec to HFS- see below]. start cyle #4 tomorrow.   # Hand foot syndrome-Xeloda-grade 1-; improved-[decrease dose of Xeloda-see above];-continue Cerva Ve SA; add urea 40% twice a day. STABLE.   # PN-1-2 sec to taxol-on Neurontin 200 qhs- STABLE.   #Mild LFT abomirnality-suspect fatty liver versus chemotherapy.  Awaiting [place] [PCP]  # DM- continue metformin 2000 mg q AM [Dr.Tullo]; PBF- 132; on glipizide 5 mg BID. .   # Mediport: port flush every 2 to 3 months.  # [place] vacation/ last sep 2022.  # DISPOSITION: # follow up in 3 weeks- MD; labs- cbc/cmp; -Dr.B

## 2020-11-02 DIAGNOSIS — C50211 Malignant neoplasm of upper-inner quadrant of right female breast: Secondary | ICD-10-CM | POA: Diagnosis not present

## 2020-11-02 DIAGNOSIS — Z171 Estrogen receptor negative status [ER-]: Secondary | ICD-10-CM | POA: Diagnosis not present

## 2020-11-08 ENCOUNTER — Other Ambulatory Visit: Payer: Self-pay | Admitting: General Surgery

## 2020-11-08 ENCOUNTER — Ambulatory Visit
Admission: RE | Admit: 2020-11-08 | Discharge: 2020-11-08 | Disposition: A | Discharge status: Home / Self Care | Payer: PPO | Source: Ambulatory Visit | Attending: Internal Medicine | Admitting: Internal Medicine

## 2020-11-08 ENCOUNTER — Other Ambulatory Visit: Payer: Self-pay

## 2020-11-08 DIAGNOSIS — K760 Fatty (change of) liver, not elsewhere classified: Secondary | ICD-10-CM | POA: Diagnosis not present

## 2020-11-08 DIAGNOSIS — R7989 Other specified abnormal findings of blood chemistry: Secondary | ICD-10-CM

## 2020-11-08 DIAGNOSIS — C50211 Malignant neoplasm of upper-inner quadrant of right female breast: Secondary | ICD-10-CM

## 2020-11-08 DIAGNOSIS — D4861 Neoplasm of uncertain behavior of right breast: Secondary | ICD-10-CM

## 2020-11-15 ENCOUNTER — Other Ambulatory Visit: Payer: Self-pay

## 2020-11-15 ENCOUNTER — Inpatient Hospital Stay: Payer: PPO | Admitting: Internal Medicine

## 2020-11-15 ENCOUNTER — Inpatient Hospital Stay: Payer: PPO

## 2020-11-15 DIAGNOSIS — Z171 Estrogen receptor negative status [ER-]: Secondary | ICD-10-CM

## 2020-11-15 DIAGNOSIS — C50211 Malignant neoplasm of upper-inner quadrant of right female breast: Secondary | ICD-10-CM

## 2020-11-15 LAB — CBC WITH DIFFERENTIAL/PLATELET
Abs Immature Granulocytes: 0.02 10*3/uL (ref 0.00–0.07)
Basophils Absolute: 0 10*3/uL (ref 0.0–0.1)
Basophils Relative: 1 %
Eosinophils Absolute: 0.1 10*3/uL (ref 0.0–0.5)
Eosinophils Relative: 2 %
HCT: 32.3 % — ABNORMAL LOW (ref 36.0–46.0)
Hemoglobin: 11.1 g/dL — ABNORMAL LOW (ref 12.0–15.0)
Immature Granulocytes: 0 %
Lymphocytes Relative: 23 %
Lymphs Abs: 1.1 10*3/uL (ref 0.7–4.0)
MCH: 33.6 pg (ref 26.0–34.0)
MCHC: 34.4 g/dL (ref 30.0–36.0)
MCV: 97.9 fL (ref 80.0–100.0)
Monocytes Absolute: 0.6 10*3/uL (ref 0.1–1.0)
Monocytes Relative: 12 %
Neutro Abs: 2.8 10*3/uL (ref 1.7–7.7)
Neutrophils Relative %: 62 %
Platelets: 154 10*3/uL (ref 150–400)
RBC: 3.3 MIL/uL — ABNORMAL LOW (ref 3.87–5.11)
RDW: 20.3 % — ABNORMAL HIGH (ref 11.5–15.5)
WBC: 4.6 10*3/uL (ref 4.0–10.5)
nRBC: 0 % (ref 0.0–0.2)

## 2020-11-15 LAB — COMPREHENSIVE METABOLIC PANEL
ALT: 48 U/L — ABNORMAL HIGH (ref 0–44)
AST: 74 U/L — ABNORMAL HIGH (ref 15–41)
Albumin: 4.1 g/dL (ref 3.5–5.0)
Alkaline Phosphatase: 86 U/L (ref 38–126)
Anion gap: 9 (ref 5–15)
BUN: 10 mg/dL (ref 8–23)
CO2: 25 mmol/L (ref 22–32)
Calcium: 9.3 mg/dL (ref 8.9–10.3)
Chloride: 102 mmol/L (ref 98–111)
Creatinine, Ser: 0.75 mg/dL (ref 0.44–1.00)
GFR, Estimated: >60 mL/min (ref >60)
Glucose, Bld: 187 mg/dL — ABNORMAL HIGH (ref 70–99)
Potassium: 3.6 mmol/L (ref 3.5–5.1)
Sodium: 136 mmol/L (ref 135–145)
Total Bilirubin: 1.2 mg/dL (ref 0.3–1.2)
Total Protein: 7.6 g/dL (ref 6.5–8.1)

## 2020-11-15 MED ORDER — HEPARIN SOD (PORK) LOCK FLUSH 100 UNIT/ML IV SOLN
500.0000 [IU] | Freq: Once | INTRAVENOUS | Status: AC
  Filled 2020-11-15: qty 5

## 2020-11-15 MED ORDER — HEPARIN SOD (PORK) LOCK FLUSH 100 UNIT/ML IV SOLN
INTRAVENOUS | Status: AC
  Administered 2020-11-15: 500 [IU] via INTRAVENOUS
  Filled 2020-11-15: qty 5

## 2020-11-15 MED ORDER — SODIUM CHLORIDE 0.9% FLUSH
10.0000 mL | INTRAVENOUS | Status: DC | PRN
  Administered 2020-11-15: 10 mL via INTRAVENOUS
  Filled 2020-11-15: qty 10

## 2020-11-15 NOTE — Assessment & Plan Note (Signed)
#   Right breast cancer-Stage IIB- T2N1-TRIPLE NEGATIVE;  s/p neoadjuvant therapy carbo platinum-Taxol; Adriamycin Cytoxan- ypT1ypN1. s/p adjuvant radiation. Currently on adjuvant xeloda. STABLE.  #Currently on Xeloda 1000 mg per metered squared [twice daily; 2 weeks on 1 week off for 6-8 cycles];cut down the dose to 1500 mg BID [sec to HFS- see below]. start cyle #5 tomorrow. Last cycle #6 aftre the trip.   # Hand foot syndrome-Xeloda-grade 1-; improved-[decrease dose of Xeloda-see above];-continue Cerva Ve SA; add urea 40% twice a day. STABLE. Marland Kitchen   # PN-1-2 sec to taxol-on Neurontin 200 qhs- STABLE.    #Mild LFT abomirnality-suspect fatty liver versus chemotherapy- [place]-fatty liver.   # DM- continue metformin 2000 mg q AM [Dr.Tullo]; PP- 182; on glipizide 5 mg BID. .   # Mediport: port flush every 2 to 3 months.  # [place] vacation/ last sep 2022.  # DISPOSITION: # follow up on sep 24th- MD; labs- cbc/cmp; -Dr.B

## 2020-11-15 NOTE — Progress Notes (Signed)
On Monday, before the xeloda week ended her feet felt like she was walking on sore feet.After, she starting taking med she was fine.

## 2020-11-16 ENCOUNTER — Encounter: Payer: Self-pay | Admitting: Internal Medicine

## 2020-11-16 NOTE — Progress Notes (Signed)
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 Byrnett.] [id number] Carbo-taxol-AC;  JAN 2022-s/p Lumpectomy & ALND- [ypT1a (38m ) ypN1 (2/11 LN-positive)]; s/p radiation end of April 2022.  #Early June 2022-Xeloda 1000 mg per metered square [2000] 2 weeks on 1 week of 6-8 cycles; July 11th 2022- STARTING cycle #3-cut down the dose to 1500 mg BID[sec to HFS].  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 No pathogenic variants identified on the [facility] Common Hereditary Cancers Panel + Skin Cancers Panel. The report date is 12/04/2019.  The Common Hereditary Cancers Panel + Skin Cancers Panel offered by [facility] includes sequencing and/or deletion duplication testing of the following 54 genes: APC*, ATM*, AXIN2, BAP1, BARD1, BMPR1A, BRCA1, BRCA2, BRIP1, CDH1, CDK4, CDKN2A (p14ARF), CDKN2A (p16INK4a), CHEK2, CTNNA1, DICER1*, EPCAM*, GREM1*, HOXB13, KIT, MEN1*, MITF*, MLH1*, MSH2*, MSH3*, MSH6*, MUTYH, NBN, NF1*, NTHL1, PALB2, PDGFRA, PMS2*, POLD1*, POLE, POT1, PTCH1, PTEN*, RAD50, RAD51C, RAD51D, RB1*, RNF43, SDHA*, SDHB, SDHC*, SDHD,  SMAD4, SMARCA4, STK11, SUFU, TP53, TSC1*, TSC2, VHL.    05/07/2020 Cancer Staging   Staging form: Breast, AJCC 8th Edition - Clinical: Stage IIIB (cT2, cN1, cM0, G3, ER-, PR-, HER2-) - Signed by BCammie Sickle MD on 05/07/2020      HISTORY OF PRESENTING ILLNESS:  Ashlee Cox 70y.o.  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  Patient also has significant provement of the hand-foot syndrome on reduced dose of Xeloda [1500 mg twice daily].   Patient admits to mild pain in the feet towards the end of the cycle.  However, Denies any further desquamation.  No diarrhea.  Chronic mild tingling and numbness in extremities.  Review of Systems  Constitutional:  Positive for malaise/fatigue. Negative for chills, diaphoresis, fever and weight loss.  HENT:  Negative for nosebleeds and sore throat.   Eyes:  Negative for double vision.  Respiratory:  Negative for cough, hemoptysis, sputum production and wheezing.   Cardiovascular:  Negative for chest pain, palpitations, orthopnea and leg swelling.  Gastrointestinal:  Negative for abdominal pain, blood in stool, constipation and melena.  Genitourinary:  Negative for dysuria, frequency and urgency.  Musculoskeletal:  Negative for back pain and joint pain.  Skin:  Positive for itching and rash.  Neurological:  Positive for tingling. Negative for dizziness, focal weakness, weakness and headaches.  Endo/Heme/Allergies:  Does not bruise/bleed easily.  Psychiatric/Behavioral:  Negative for depression. The patient is not nervous/anxious and does not have insomnia.     MEDICAL HISTORY:  Past Medical History:  Diagnosis Date   Cancer ([facility] 2021   right breast   Cyst, breast    benign  Diabetes mellitus without complication ([place])    Family history of melanoma    Family history of ovarian cancer    GERD (gastroesophageal reflux disease)    H/O: rheumatic fever     History of colonoscopy 2010   done bc of bleeding,  normal , due back in 5 yrs ([place])   Hyperlipidemia    Hypertension    Menopause    at age 65   Personal history of chemotherapy 09/2019   RIGHT  INVASIVE MAMMARY CARCINOMA    SURGICAL HISTORY: Past Surgical History:  Procedure Laterality Date   APPENDECTOMY  2006   BREAST BIOPSY Right 09/22/2019   INVASIVE MAMMARY CARCINOMA   BREAST CYST ASPIRATION Left 1999   BREAST LUMPECTOMY Left 04/23/2020   surgery with NL and SN    BREAST LUMPECTOMY WITH NEEDLE LOCALIZATION Right 04/23/2020   Procedure: BREAST LUMPECTOMY WITH NEEDLE LOCALIZATION;  Surgeon: Robert Bellow, MD;  Location: [facility] ORS;  Service: General;  Laterality: Right;   BREAST LUMPECTOMY WITH SENTINEL LYMPH NODE BIOPSY Right 04/23/2020   Procedure: BREAST LUMPECTOMY WITH SENTINEL LYMPH NODE BX;  Surgeon: Robert Bellow, MD;  Location: [facility] ORS;  Service: General;  Laterality: Right;   CHOLECYSTECTOMY  2006   COLONOSCOPY     PORTACATH PLACEMENT Left 10/06/2019   Procedure: INSERTION PORT-A-CATH;  Surgeon: Robert Bellow, MD;  Location: [facility] ORS;  Service: General;  Laterality: Left;   SUBMUCOSAL TATTOO INJECTION Right 10/06/2019   Procedure: Right axillary TATTOO INJECTION;  Surgeon: Robert Bellow, MD;  Location: [facility] ORS;  Service: General;  Laterality: Right;   VAGINAL DELIVERY     x3    SOCIAL HISTORY: Social History   Socioeconomic History   Marital status: Married    Spouse name: Chrissie Noa   Number of children: 3   Years of education: Not on file   Highest education level: Not on file  Occupational History   Occupation: Glass blower/designer: [employer]  Tobacco Use   Smoking status: Former    Years: 1.00    Types: Cigarettes    Quit date: 12/26/2005    Years since quitting: 14.9   Smokeless tobacco: Never   Tobacco comments:    smoked for less than 1 years,  1 cig/day  Vaping Use   Vaping Use: Never used  Substance and Sexual Activity    Alcohol use: No   Drug use: No   Sexual activity: Never    Partners: Female  Other Topics Concern   Not on file  Social History Narrative   Widowed, March 2014; remarried.      [employer] ret[employer]; quit smoking 1ppw; no alcohol.       Social Determinants of Health   Financial Resource Strain: Not on file  Food Insecurity: Not on file  Transportation Needs: Not on file  Physical Activity: Not on file  Stress: Not on file  Social Connections: Not on file  Intimate Partner Violence: Not on file    FAMILY HISTORY: Family History  Problem Relation Age of Onset   Cancer Mother 106       ovarian- died 4-5 years.    Heart disease Mother 50   Diabetes Mother    Stroke Father 23   Diabetes Father    Diabetes Sister    Melanoma Sister        survived   Breast cancer Neg Hx     ALLERGIES:  has No Known Allergies.  MEDICATIONS:  Current Outpatient Medications  Medication Sig Dispense Refill   acetaminophen (TYLENOL) 500 MG tablet Take 500 mg by mouth every 8 (eight) hours as needed for moderate pain.     Biotin 1000 MCG tablet Take 1,000 mcg by mouth daily.     capecitabine (XELODA) 500 MG tablet Take 4 pills in AM: and 4 pills in PM- 2 weeks-ON and 1 week OFF 112 tablet 6   gabapentin (NEURONTIN) 100 MG capsule Take 2 capsules (200 mg total) by mouth at bedtime. 60 capsule 6   glipiZIDE (GLUCOTROL) 5 MG tablet Take 1 tablet (5 mg total) by mouth 2 (two) times daily before a meal. 60 tablet 1   losartan-hydrochlorothiazide (HYZAAR) 50-12.5 MG tablet Take 1 tablet by mouth once daily 90 tablet 0   lovastatin (MEVACOR) 40 MG tablet TAKE 1 TABLET BY MOUTH AT BEDTIME 90 tablet 0   metFORMIN (GLUCOPHAGE) 1000 MG tablet Take 2 tablets (2,000 mg total) by mouth daily with breakfast 180 tablet 1   Multiple Vitamin (MULTIVITAMIN) tablet Take 1 tablet by mouth daily.     omeprazole (PRILOSEC) 20 MG capsule Take 1 capsule by mouth once daily 90 capsule 0   No current facility-administered  medications for this visit.      Marland Kitchen  PHYSICAL EXAMINATION: ECOG PERFORMANCE STATUS: 0 - Asymptomatic  Vitals:   11/15/20 1509  BP: (!) 144/82  Pulse: 86  Resp: 18  Temp: 99.7 F (37.6 C)   Filed Weights   11/15/20 1509  Weight: 191 lb 3.2 oz (86.7 kg)    Physical Exam Constitutional:      Comments: Ambulating independently.  Alone.  HENT:     Head: Normocephalic and atraumatic.     Mouth/Throat:     Pharynx: No oropharyngeal exudate.  Eyes:     Pupils: Pupils are equal, round, and reactive to light.  Cardiovascular:     Rate and Rhythm: Normal rate and regular rhythm.  Pulmonary:     Effort: Pulmonary effort is normal. No respiratory distress.     Breath sounds: Normal breath sounds. No wheezing.  Abdominal:     General: Bowel sounds are normal. There is no distension.     Palpations: Abdomen is soft. There is no mass.     Tenderness: There is no abdominal tenderness. There is no guarding or rebound.  Musculoskeletal:        General: No tenderness. Normal range of motion.     Cervical back: Normal range of motion and neck supple.  Skin:    General: Skin is warm.     Comments: Significant improvement of the rash on palms and soles.  Mild desquamation noted.  Neurological:     Mental Status: She is alert and oriented to person, place, and time.  Psychiatric:        Mood and Affect: Affect normal.     LABORATORY DATA:  I have reviewed the data as listed Lab Results  Component Value Date   WBC 4.6 11/15/2020   HGB 11.1 (L) 11/15/2020   HCT 32.3 (L) 11/15/2020   MCV 97.9 11/15/2020   PLT 154 11/15/2020   Recent Labs    12/02/19 0922 12/09/19 0844 12/23/19 0846 12/30/19 0839 10/04/20 1024 10/25/20 1030 11/15/20 1453  NA 144 140 140   < > 138 137 136  K 3.4* 3.3* 3.3*   < > 3.8 3.4* 3.6  CL 106 104 104   < > 105 103 102  CO2 _0 < > 25 21*  25  GLUCOSE 194* 204* 237*   < > 116* 132* 187*  BUN _0 < > _1 CREATININE 0.68 0.79  0.77   < > 0.82 0.74 0.75  CALCIUM 9.1 8.9 9.1   < > 9.7 9.2 9.3  GFRNONAA >60 >60 >60   < > >60 >60 >60  GFRAA >60 >60 >60  --   --   --   --   PROT  --   --  6.7   < > 7.6 7.1 7.6  ALBUMIN  --   --  3.8   < > 3.8 3.9 4.1  AST  --   --  56*   < > 72* 91* 74*  ALT  --   --  43   < > 42 65* 48*  ALKPHOS  --   --  66   < > 106 77 86  BILITOT  --   --  0.6   < > 0.7 0.7 1.2   < > = values in this interval not displayed.    RADIOGRAPHIC STUDIES: I have personally reviewed the radiological images as listed and agreed with the findings in the report. US Abdomen Limited RUQ (LIVER/GB)  Result Date: 11/08/2020 CLINICAL DATA:  Elevated LFTs, remote cholecystectomy EXAM: ULTRASOUND ABDOMEN LIMITED RIGHT UPPER QUADRANT COMPARISON:  08/02/2004 FINDINGS: Gallbladder: Surgically absent Common bile duct: Diameter: 2.8 mm Liver: Increased hepatic echogenicity compatible with hepatic steatosis. No large focal abnormality by ultrasound or biliary obstruction pattern. Portal vein is patent on color Doppler imaging with normal direction of blood flow towards the liver. Other: No free fluid or ascites IMPRESSION: Remote cholecystectomy Hepatic steatosis No acute finding by ultrasound. Electronically Signed   By: Jerilynn Mages.  Shick M.D.   On: 11/08/2020 09:01    ASSESSMENT & PLAN:   Carcinoma of upper-inner quadrant of right breast in female, estrogen receptor negative ([place]) # Right breast cancer-Stage IIB- T2N1-TRIPLE NEGATIVE;  s/p neoadjuvant therapy carbo platinum-Taxol; Adriamycin Cytoxan- [id number]. s/p adjuvant radiation. Currently on adjuvant xeloda. STABLE.  #Currently on Xeloda 1000 mg per metered squared [twice daily; 2 weeks on 1 week off for 6-8 cycles];cut down the dose to 1500 mg BID [sec to HFS- see below]. start cyle #5 tomorrow. Last cycle #6 aftre the trip.   # Hand foot syndrome-Xeloda-grade 1-; improved-[decrease dose of Xeloda-see above];-continue Cerva Ve SA; add urea 40% twice a day. STABLE. Marland Kitchen    # PN-1-2 sec to taxol-on Neurontin 200 qhs- STABLE.    #Mild LFT abomirnality-suspect fatty liver versus chemotherapy- US-fatty liver.   # DM- continue metformin 2000 mg q AM [Dr.Tullo]; PP- 182; on glipizide 5 mg BID. .   # Mediport: port flush every 2 to 3 months.  # [place] vacation/ last sep 2022.  # DISPOSITION: # follow up on sep 24th- MD; labs- cbc/cmp; -Dr.B    All questions were answered. The patient/family knows to call the clinic with any problems, questions or concerns.   Cammie Sickle, MD 11/16/2020 10:15 PM

## 2020-11-17 ENCOUNTER — Other Ambulatory Visit: Payer: Self-pay

## 2020-11-17 ENCOUNTER — Ambulatory Visit
Admission: RE | Admit: 2020-11-17 | Discharge: 2020-11-17 | Disposition: A | Discharge status: Home / Self Care | Payer: PPO | Source: Ambulatory Visit | Attending: General Surgery | Admitting: General Surgery

## 2020-11-17 DIAGNOSIS — Z171 Estrogen receptor negative status [ER-]: Secondary | ICD-10-CM | POA: Diagnosis not present

## 2020-11-17 DIAGNOSIS — R922 Inconclusive mammogram: Secondary | ICD-10-CM | POA: Diagnosis not present

## 2020-11-17 DIAGNOSIS — C50211 Malignant neoplasm of upper-inner quadrant of right female breast: Secondary | ICD-10-CM

## 2020-11-17 HISTORY — DX: Personal history of irradiation: Z92.3

## 2020-11-29 ENCOUNTER — Other Ambulatory Visit: Payer: Self-pay | Admitting: Internal Medicine

## 2020-12-02 ENCOUNTER — Other Ambulatory Visit: Payer: Self-pay | Admitting: Internal Medicine

## 2020-12-02 DIAGNOSIS — H2513 Age-related nuclear cataract, bilateral: Secondary | ICD-10-CM | POA: Diagnosis not present

## 2020-12-02 LAB — HM DIABETES EYE EXAM

## 2020-12-10 ENCOUNTER — Encounter: Payer: Self-pay | Admitting: Internal Medicine

## 2020-12-16 ENCOUNTER — Telehealth: Payer: Self-pay | Admitting: *Deleted

## 2020-12-16 NOTE — Telephone Encounter (Signed)
Patient called reporting that she has had congestion for 2 days, she ran low grade fever yesterday, but none today. She is currently out of state in [place] at [place], but has an appointment 9/29 with Dr B. She is asking if she needs to do anything. Please advise

## 2020-12-16 NOTE — Telephone Encounter (Signed)
Call returned to patient and advised of NP response. She said she will try to find a place to get a test or get tested and call us back though it may be tomorrow morning before she calls back

## 2020-12-17 ENCOUNTER — Telehealth: Payer: Self-pay | Admitting: *Deleted

## 2020-12-17 NOTE — Telephone Encounter (Signed)
Call returned to patient and advised of NP response. She states that she will be headed home tomorrow and will be home late tomorrow evening. She thanked me for calling and said she was glad it was not COVID. She also confirmed her appointment for 9/29

## 2020-12-17 NOTE — Telephone Encounter (Signed)
Patient called back to report that her COVID test is negative. Did you want ot order anything for her congestion?

## 2020-12-21 ENCOUNTER — Other Ambulatory Visit: Payer: Self-pay

## 2020-12-21 ENCOUNTER — Telehealth: Payer: Self-pay | Admitting: *Deleted

## 2020-12-21 ENCOUNTER — Inpatient Hospital Stay: Payer: PPO | Attending: Oncology | Admitting: Oncology

## 2020-12-21 DIAGNOSIS — U071 COVID-19: Secondary | ICD-10-CM

## 2020-12-21 MED ORDER — NIRMATRELVIR/RITONAVIR (PAXLOVID)TABLET: 3.0000 | ORAL_TABLET | Freq: Two times a day (BID) | ORAL | 0 refills | Status: AC

## 2020-12-21 NOTE — Telephone Encounter (Signed)
Patient called reporting that her husband has COVID and she most likely does also. She has an appointment Thursday that she wants to reschedule. Please advise of anything else she may need

## 2020-12-21 NOTE — Progress Notes (Signed)
Outpatient Oral COVID Treatment Note  I connected with Ashlee Cox on 12/21/2020/10:44 AM by telephone and verified that I am speaking with the correct person using two identifiers.  I discussed the limitations, risks, security, and privacy concerns of performing an evaluation and management service by telephone and the availability of in person appointments. I also discussed with the patient that there may be a patient responsible charge related to this service. The patient expressed understanding and agreed to proceed.  Patient location: Home  Provider location: Clinic   Diagnosis: COVID-19 infection  Purpose of visit: Discussion of potential use of Molnupiravir or Paxlovid, a new treatment for mild to moderate COVID-19 viral infection in non-hospitalized patients.   Subjective: Patient is a 70 y.o. female who has been diagnosed with COVID 19 viral infection.  Their symptoms began on 12/18/20.   Past Medical History:  Diagnosis Date   Cancer [facility]) 2021   right breast   Cyst, breast    benign   Diabetes mellitus without complication ([place])    Family history of melanoma    Family history of ovarian cancer    GERD (gastroesophageal reflux disease)    H/O: rheumatic fever    History of colonoscopy 2010   done bc of bleeding,  normal , due back in 5 yrs ([place])   Hyperlipidemia    Hypertension    Menopause    at age 39   Personal history of chemotherapy 09/2019   RIGHT  INVASIVE MAMMARY CARCINOMA   Personal history of radiation therapy     No Known Allergies   Current Outpatient Medications:    acetaminophen (TYLENOL) 500 MG tablet, Take 500 mg by mouth every 8 (eight) hours as needed for moderate pain., Disp: , Rfl:    Biotin 1000 MCG tablet, Take 1,000 mcg by mouth daily., Disp: , Rfl:    capecitabine (XELODA) 500 MG tablet, Take 4 pills in AM: and 4 pills in PM- 2 weeks-ON and 1 week OFF, Disp: 112 tablet, Rfl: 6   gabapentin (NEURONTIN) 100 MG capsule, Take 2  capsules (200 mg total) by mouth at bedtime., Disp: 60 capsule, Rfl: 6   glipiZIDE (GLUCOTROL) 5 MG tablet, Take 1 tablet (5 mg total) by mouth 2 (two) times daily before a meal., Disp: 60 tablet, Rfl: 1   losartan-hydrochlorothiazide (HYZAAR) 50-12.5 MG tablet, Take 1 tablet by mouth once daily, Disp: 90 tablet, Rfl: 0   lovastatin (MEVACOR) 40 MG tablet, TAKE 1 TABLET BY MOUTH AT BEDTIME, Disp: 90 tablet, Rfl: 0   metFORMIN (GLUCOPHAGE) 1000 MG tablet, Take 2 tablets (2,000 mg total) by mouth daily with breakfast, Disp: 180 tablet, Rfl: 1   Multiple Vitamin (MULTIVITAMIN) tablet, Take 1 tablet by mouth daily., Disp: , Rfl:    omeprazole (PRILOSEC) 20 MG capsule, Take 1 capsule by mouth once daily, Disp: 90 capsule, Rfl: 0  Objective: Patient sounds well. They are in no apparent distress.  Breathing is non labored.  Mood and behavior are normal.  Laboratory Data:  Recent Results (from the past 2160 hour(s))  Comprehensive metabolic panel     Status: Abnormal   Collection Time: 10/04/20 10:24 AM  Result Value Ref Range   Sodium 138 135 - 145 mmol/L   Potassium 3.8 3.5 - 5.1 mmol/L   Chloride 105 98 - 111 mmol/L   CO2 25 22 - 32 mmol/L   Glucose, Bld 116 (H) 70 - 99 mg/dL    Comment: Glucose reference range applies only to samples  taken after fasting for at least 8 hours.   BUN 12 8 - 23 mg/dL   Creatinine, Ser 0.82 0.44 - 1.00 mg/dL   Calcium 9.7 8.9 - 10.3 mg/dL   Total Protein 7.6 6.5 - 8.1 g/dL   Albumin 3.8 3.5 - 5.0 g/dL   AST 72 (H) 15 - 41 U/L   ALT 42 0 - 44 U/L   Alkaline Phosphatase 106 38 - 126 U/L   Total Bilirubin 0.7 0.3 - 1.2 mg/dL   GFR, Estimated >60 >60 mL/min    Comment: (NOTE) Calculated using the CKD-EPI Creatinine Equation (2021)    Anion gap 8 5 - 15    Comment: Performed at [facility], [address] [place], [place] [address]  CBC with Differential     Status: Abnormal   Collection Time: 10/04/20 10:24 AM  Result Value Ref Range   WBC 4.7 4.0  - 10.5 K/uL   RBC 3.81 (L) 3.87 - 5.11 MIL/uL   Hemoglobin 11.8 (L) 12.0 - 15.0 g/dL   HCT 36.3 36.0 - 46.0 %   MCV 95.3 80.0 - 100.0 fL   MCH 31.0 26.0 - 34.0 pg   MCHC 32.5 30.0 - 36.0 g/dL   RDW 20.3 (H) 11.5 - 15.5 %   Platelets 156 150 - 400 K/uL   nRBC 0.0 0.0 - 0.2 %   Neutrophils Relative % 61 %   Neutro Abs 2.8 1.7 - 7.7 K/uL   Lymphocytes Relative 23 %   Lymphs Abs 1.1 0.7 - 4.0 K/uL   Monocytes Relative 11 %   Monocytes Absolute 0.5 0.1 - 1.0 K/uL   Eosinophils Relative 4 %   Eosinophils Absolute 0.2 0.0 - 0.5 K/uL   Basophils Relative 1 %   Basophils Absolute 0.1 0.0 - 0.1 K/uL   Immature Granulocytes 0 %   Abs Immature Granulocytes 0.02 0.00 - 0.07 K/uL    Comment: Performed at [facility], [address] [place], [place] [address]  POCT HgB A1C     Status: Abnormal   Collection Time: 10/13/20  8:41 AM  Result Value Ref Range   Hemoglobin A1C 6.8 (A) 4.0 - 5.6 %   HbA1c POC (<> result, manual entry)     HbA1c, POC (prediabetic range)     HbA1c, POC (controlled diabetic range)    Microalbumin / creatinine urine ratio     Status: None   Collection Time: 10/13/20  9:29 AM  Result Value Ref Range   Microalb, Ur 0.9 0.0 - 1.9 mg/dL   Creatinine,U 207.0 mg/dL   Microalb Creat Ratio 0.4 0.0 - 30.0 mg/g  Comprehensive metabolic panel     Status: Abnormal   Collection Time: 10/25/20 10:30 AM  Result Value Ref Range   Sodium 137 135 - 145 mmol/L   Potassium 3.4 (L) 3.5 - 5.1 mmol/L   Chloride 103 98 - 111 mmol/L   CO2 21 (L) 22 - 32 mmol/L   Glucose, Bld 132 (H) 70 - 99 mg/dL    Comment: Glucose reference range applies only to samples taken after fasting for at least 8 hours.   BUN 15 8 - 23 mg/dL   Creatinine, Ser 0.74 0.44 - 1.00 mg/dL   Calcium 9.2 8.9 - 10.3 mg/dL   Total Protein 7.1 6.5 - 8.1 g/dL   Albumin 3.9 3.5 - 5.0 g/dL   AST 91 (H) 15 - 41 U/L   ALT 65 (H) 0 - 44 U/L   Alkaline  Phosphatase 77 38 - 126 U/L   Total Bilirubin 0.7 0.3 - 1.2  mg/dL   GFR, Estimated >60 >60 mL/min    Comment: (NOTE) Calculated using the CKD-EPI Creatinine Equation (2021)    Anion gap 13 5 - 15    Comment: Performed at [facility], [address] [place], [place] [address]  CBC with Differential/Platelet     Status: Abnormal   Collection Time: 10/25/20 10:30 AM  Result Value Ref Range   WBC 3.5 (L) 4.0 - 10.5 K/uL   RBC 3.66 (L) 3.87 - 5.11 MIL/uL   Hemoglobin 11.7 (L) 12.0 - 15.0 g/dL   HCT 34.6 (L) 36.0 - 46.0 %   MCV 94.5 80.0 - 100.0 fL   MCH 32.0 26.0 - 34.0 pg   MCHC 33.8 30.0 - 36.0 g/dL   RDW 19.9 (H) 11.5 - 15.5 %   Platelets 131 (L) 150 - 400 K/uL   nRBC 0.0 0.0 - 0.2 %   Neutrophils Relative % 57 %   Neutro Abs 2.0 1.7 - 7.7 K/uL   Lymphocytes Relative 26 %   Lymphs Abs 0.9 0.7 - 4.0 K/uL   Monocytes Relative 12 %   Monocytes Absolute 0.4 0.1 - 1.0 K/uL   Eosinophils Relative 4 %   Eosinophils Absolute 0.2 0.0 - 0.5 K/uL   Basophils Relative 1 %   Basophils Absolute 0.0 0.0 - 0.1 K/uL   Immature Granulocytes 0 %   Abs Immature Granulocytes 0.01 0.00 - 0.07 K/uL    Comment: Performed at [facility], [address] [place], [place] [address]  Comprehensive metabolic panel     Status: Abnormal   Collection Time: 11/15/20  2:53 PM  Result Value Ref Range   Sodium 136 135 - 145 mmol/L   Potassium 3.6 3.5 - 5.1 mmol/L   Chloride 102 98 - 111 mmol/L   CO2 25 22 - 32 mmol/L   Glucose, Bld 187 (H) 70 - 99 mg/dL    Comment: Glucose reference range applies only to samples taken after fasting for at least 8 hours.   BUN 10 8 - 23 mg/dL   Creatinine, Ser 0.75 0.44 - 1.00 mg/dL   Calcium 9.3 8.9 - 10.3 mg/dL   Total Protein 7.6 6.5 - 8.1 g/dL   Albumin 4.1 3.5 - 5.0 g/dL   AST 74 (H) 15 - 41 U/L   ALT 48 (H) 0 - 44 U/L   Alkaline Phosphatase 86 38 - 126 U/L   Total Bilirubin 1.2 0.3 - 1.2 mg/dL   GFR, Estimated >60 >60 mL/min    Comment: (NOTE) Calculated using the CKD-EPI Creatinine Equation (2021)     Anion gap 9 5 - 15    Comment: Performed at [facility], [address] [place], [place] [address]  CBC with Differential/Platelet     Status: Abnormal   Collection Time: 11/15/20  2:53 PM  Result Value Ref Range   WBC 4.6 4.0 - 10.5 K/uL   RBC 3.30 (L) 3.87 - 5.11 MIL/uL   Hemoglobin 11.1 (L) 12.0 - 15.0 g/dL   HCT 32.3 (L) 36.0 - 46.0 %   MCV 97.9 80.0 - 100.0 fL   MCH 33.6 26.0 - 34.0 pg   MCHC 34.4 30.0 - 36.0 g/dL   RDW 20.3 (H) 11.5 - 15.5 %   Platelets 154 150 - 400 K/uL   nRBC 0.0 0.0 - 0.2 %   Neutrophils Relative % 62 %   Neutro  Abs 2.8 1.7 - 7.7 K/uL   Lymphocytes Relative 23 %   Lymphs Abs 1.1 0.7 - 4.0 K/uL   Monocytes Relative 12 %   Monocytes Absolute 0.6 0.1 - 1.0 K/uL   Eosinophils Relative 2 %   Eosinophils Absolute 0.1 0.0 - 0.5 K/uL   Basophils Relative 1 %   Basophils Absolute 0.0 0.0 - 0.1 K/uL   Immature Granulocytes 0 %   Abs Immature Granulocytes 0.02 0.00 - 0.07 K/uL    Comment: Performed at [facility], [address] [place], [place] [address]  HM DIABETES EYE EXAM     Status: None   Collection Time: 12/02/20 12:00 AM  Result Value Ref Range   HM Diabetic Eye Exam No Retinopathy No Retinopathy     Assessment: 70 y.o. female with mild/moderate COVID 19 viral infection diagnosed on 11/17/20 at high risk for progression to severe COVID 19.  Plan:  This patient is a 70 y.o. female that meets the following criteria for Emergency Use Authorization of: Paxlovid 1. Age >12 yr AND > 40 kg 2. SARS-COV-2 positive test 3. Symptom onset < 5 days 4. Mild-to-moderate COVID disease with high risk for severe progression to hospitalization or death  I have spoken and communicated the following to the patient or parent/caregiver regarding: Paxlovid is an unapproved drug that is authorized for use under an Emergency Use Authorization.  There are no adequate, approved, available products for the treatment of COVID-19 in adults who have  mild-to-moderate COVID-19 and are at high risk for progressing to severe COVID-19, including hospitalization or death. Other therapeutics are currently authorized. For additional information on all products authorized for treatment or prevention of COVID-19, please see [email or web address].  There are benefits and risks of taking this treatment as outlined in the "Fact Sheet for Patients and Caregivers."  "Fact Sheet for Patients and Caregivers" was reviewed with patient. A hard copy will be provided to patient from pharmacy prior to the patient receiving treatment. Patients should continue to self-isolate and use infection control measures (e.g., wear mask, isolate, social distance, avoid sharing personal items, clean and disinfect "high touch" surfaces, and frequent handwashing) according to CDC guidelines.  The patient or parent/caregiver has the option to accept or refuse treatment. Patient medication history was reviewed for potential drug interactions:Interaction with home meds: Lovastatin Patient's GFR was calculated to be >60, and they were therefore prescribed Normal dose (GFR>60) - nirmatrelvir 150mg  tab (2 tablet) by mouth twice daily AND ritonavir 100mg  tab (1 tablet) by mouth twice daily  After reviewing above information with the patient, the patient agrees to receive Paxlovid.  Follow up instructions:    Take prescription BID x 5 days as directed Reach out to pharmacist for counseling on medication if desired For concerns regarding further COVID symptoms please follow up with your PCP or urgent care For urgent or life-threatening issues, seek care at your local emergency department  The patient was provided an opportunity to ask questions, and all were answered. The patient agreed with the plan and demonstrated an understanding of the instructions.   RX sent to [facility]. [place], [place]  [address] [place] [address]  Phone:  [phone]  Fax:  [phone]   The patient was advised to call their PCP or seek an in-person evaluation if the symptoms worsen or if the condition fails to improve as anticipated.   I provided 15 minutes of non face-to-face telephone visit  time during this encounter, and > 50% was spent counseling as documented under my assessment & plan.  Jacquelin Hawking, NP 12/21/2020 /10:44 AM

## 2020-12-21 NOTE — Telephone Encounter (Signed)
Apts with Dr. Jacinto Reap on 9/29 cnl by Nira Conn, RN due to covid +. We can r/s these apts once pt has fully recovered from covid.

## 2020-12-21 NOTE — Telephone Encounter (Signed)
Do you want me to do an virtual visit to start her on an antiviral?  Faythe Casa, NP 12/21/2020 9:28 AM

## 2020-12-21 NOTE — Telephone Encounter (Signed)
Yes, Please. Ashlee Cox- pls coordinate.

## 2020-12-21 NOTE — Telephone Encounter (Signed)
Pt states she tested positive yesterday on a home test. Pt is okay with a virtual visit to discuss antiviral txs and supportive care.

## 2020-12-21 NOTE — Telephone Encounter (Signed)
Pt is down for mychart 10:50 this morning

## 2020-12-23 ENCOUNTER — Inpatient Hospital Stay: Payer: PPO | Admitting: Internal Medicine

## 2020-12-23 ENCOUNTER — Inpatient Hospital Stay: Payer: PPO

## 2020-12-24 ENCOUNTER — Encounter: Payer: Self-pay | Admitting: *Deleted

## 2020-12-27 ENCOUNTER — Telehealth: Payer: Self-pay

## 2020-12-27 DIAGNOSIS — Z1211 Encounter for screening for malignant neoplasm of colon: Secondary | ICD-10-CM

## 2020-12-27 NOTE — Telephone Encounter (Signed)
LMTCB. Need to let pt know that she is past due for her cologuard screening. Also is if pt is okay with [place] ordering for her.

## 2020-12-28 ENCOUNTER — Telehealth: Payer: Self-pay

## 2020-12-28 NOTE — Addendum Note (Signed)
Addended by: Adair Laundry on: 12/28/2020 09:43 AM   Modules accepted: Orders

## 2020-12-28 NOTE — Telephone Encounter (Signed)
Spoke with pt and she stated that she would like to do the cologuard screening again if there is no reason she can not. Pt has a history of breast cancer and no history of colon cancer. If still okay I will order.

## 2020-12-28 NOTE — Telephone Encounter (Signed)
Ordered

## 2020-12-28 NOTE — Telephone Encounter (Signed)
Error

## 2021-01-06 ENCOUNTER — Other Ambulatory Visit: Payer: Self-pay | Admitting: *Deleted

## 2021-01-06 DIAGNOSIS — C50211 Malignant neoplasm of upper-inner quadrant of right female breast: Secondary | ICD-10-CM

## 2021-01-07 ENCOUNTER — Inpatient Hospital Stay (HOSPITAL_BASED_OUTPATIENT_CLINIC_OR_DEPARTMENT_OTHER): Payer: PPO | Admitting: Internal Medicine

## 2021-01-07 ENCOUNTER — Inpatient Hospital Stay: Payer: PPO | Attending: Internal Medicine

## 2021-01-07 ENCOUNTER — Other Ambulatory Visit: Payer: Self-pay

## 2021-01-07 VITALS — BP 152/70 | HR 95 | Temp 98.7°F | Resp 18 | Ht 67.0 in | Wt 191.0 lb

## 2021-01-07 DIAGNOSIS — C50211 Malignant neoplasm of upper-inner quadrant of right female breast: Secondary | ICD-10-CM | POA: Diagnosis not present

## 2021-01-07 DIAGNOSIS — Z171 Estrogen receptor negative status [ER-]: Secondary | ICD-10-CM | POA: Diagnosis not present

## 2021-01-07 DIAGNOSIS — E1165 Type 2 diabetes mellitus with hyperglycemia: Secondary | ICD-10-CM | POA: Insufficient documentation

## 2021-01-07 DIAGNOSIS — Z7984 Long term (current) use of oral hypoglycemic drugs: Secondary | ICD-10-CM | POA: Insufficient documentation

## 2021-01-07 DIAGNOSIS — Z853 Personal history of malignant neoplasm of breast: Secondary | ICD-10-CM | POA: Diagnosis not present

## 2021-01-07 LAB — COMPREHENSIVE METABOLIC PANEL
ALT: 66 U/L — ABNORMAL HIGH (ref 0–44)
AST: 128 U/L — ABNORMAL HIGH (ref 15–41)
Albumin: 4 g/dL (ref 3.5–5.0)
Alkaline Phosphatase: 125 U/L (ref 38–126)
Anion gap: 10 (ref 5–15)
BUN: 13 mg/dL (ref 8–23)
CO2: 24 mmol/L (ref 22–32)
Calcium: 9.2 mg/dL (ref 8.9–10.3)
Chloride: 101 mmol/L (ref 98–111)
Creatinine, Ser: 0.64 mg/dL (ref 0.44–1.00)
GFR, Estimated: >60 mL/min (ref >60)
Glucose, Bld: 299 mg/dL — ABNORMAL HIGH (ref 70–99)
Potassium: 3.5 mmol/L (ref 3.5–5.1)
Sodium: 135 mmol/L (ref 135–145)
Total Bilirubin: 1 mg/dL (ref 0.3–1.2)
Total Protein: 7.6 g/dL (ref 6.5–8.1)

## 2021-01-07 LAB — CBC WITH DIFFERENTIAL/PLATELET
Abs Immature Granulocytes: 0.03 10*3/uL (ref 0.00–0.07)
Basophils Absolute: 0 10*3/uL (ref 0.0–0.1)
Basophils Relative: 1 %
Eosinophils Absolute: 0.1 10*3/uL (ref 0.0–0.5)
Eosinophils Relative: 2 %
HCT: 34.6 % — ABNORMAL LOW (ref 36.0–46.0)
Hemoglobin: 11.5 g/dL — ABNORMAL LOW (ref 12.0–15.0)
Immature Granulocytes: 1 %
Lymphocytes Relative: 14 %
Lymphs Abs: 0.8 10*3/uL (ref 0.7–4.0)
MCH: 31.3 pg (ref 26.0–34.0)
MCHC: 33.2 g/dL (ref 30.0–36.0)
MCV: 94.3 fL (ref 80.0–100.0)
Monocytes Absolute: 0.5 10*3/uL (ref 0.1–1.0)
Monocytes Relative: 9 %
Neutro Abs: 4 10*3/uL (ref 1.7–7.7)
Neutrophils Relative %: 73 %
Platelets: 154 10*3/uL (ref 150–400)
RBC: 3.67 MIL/uL — ABNORMAL LOW (ref 3.87–5.11)
RDW: 16.2 % — ABNORMAL HIGH (ref 11.5–15.5)
WBC: 5.4 10*3/uL (ref 4.0–10.5)
nRBC: 0 % (ref 0.0–0.2)

## 2021-01-07 NOTE — Assessment & Plan Note (Addendum)
#   Right breast cancer-Stage IIB- T2N1-TRIPLE NEGATIVE;  s/p neoadjuvant therapy carbo platinum-Taxol; Adriamycin Cytoxan- [id number]. s/p adjuvant radiation. Currently s/p adjuvant xeloda x 6 cylces.   #We will plan at baseline CT chest abdomen pelvis in 1 month-we will evaluate the LFTs.  #August 2022 mammogram-right-sided calcifications-suspect radiation/surgery changes rather than malignancy.  Discussed with Dr. Bary Castilla.  Awaiting repeat mammogram in February 2023.  # Hand foot syndrome-Xeloda-resolved.   # PN-1-2 sec to taxol-on Neurontin 200 qhs-stable  #Mild LFT abomirnality-suspect fatty liver versus chemotherapy-ultrasound suggestive of fatty liver.  Also await CT scan.  # DM/poorly controlled- continue metformin 2000 mg q AM [Dr.Tullo]; PP- 299; on glipizide 5 mg BID.  Recommend evaluation with PCP  # Mediport: port flush every 2 to 3 months.  # DISPOSITION: # follow up in 1 months- MD; labs- cbc/cmp/ca27-29; port flush; CT prior-Dr.B

## 2021-01-07 NOTE — Progress Notes (Signed)
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 Byrnett.] [id number] Carbo-taxol-AC;  JAN 2022-s/p Lumpectomy & ALND- [ypT1a (71m ) ypN1 (2/11 LN-positive)]; s/p radiation end of April 2022.  #Early June 2022-Xeloda 1000 mg per metered square [2000] 2 weeks on 1 week of 6-8 cycles; July 11th 2022- STARTING cycle #3-cut down the dose to 1500 mg BID[sec to HFS].  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 No pathogenic variants identified on the [facility] Common Hereditary Cancers Panel + Skin Cancers Panel. The report date is 12/04/2019.  The Common Hereditary Cancers Panel + Skin Cancers Panel offered by [facility] includes sequencing and/or deletion duplication testing of the following 54 genes: APC*, ATM*, AXIN2, BAP1, BARD1, BMPR1A, BRCA1, BRCA2, BRIP1, CDH1, CDK4, CDKN2A (p14ARF), CDKN2A (p16INK4a), CHEK2, CTNNA1, DICER1*, EPCAM*, GREM1*, HOXB13, KIT, MEN1*, MITF*, MLH1*, MSH2*, MSH3*, MSH6*, MUTYH, NBN, NF1*, NTHL1, PALB2, PDGFRA, PMS2*, POLD1*, POLE, POT1, PTCH1, PTEN*, RAD50, RAD51C, RAD51D, RB1*, RNF43, SDHA*, SDHB, SDHC*, SDHD,  SMAD4, SMARCA4, STK11, SUFU, TP53, TSC1*, TSC2, VHL.    05/07/2020 Cancer Staging   Staging form: Breast, AJCC 8th Edition - Clinical: Stage IIIB (cT2, cN1, cM0, G3, ER-, PR-, HER2-) - Signed by BCammie Sickle MD on 05/07/2020      HISTORY OF PRESENTING ILLNESS: Alone.  Walking independently. Ashlee Cox 70y.o.  female patient with triple negative breast cancer stage IIb -currently s/p adjuvant Xeloda is here for follow-up.  In the interim [place]   However patient does travel diagnosed with COVID.  No hospitalizations.  S/p paxlovid.  Patient symptoms are resolved.  She is back to baseline health. No diarrhea.  Chronic mild tingling and numbness in extremities.  Review of Systems  Constitutional:  Positive for malaise/fatigue. Negative for chills, diaphoresis, fever and weight loss.  HENT:  Negative for nosebleeds and sore throat.   Eyes:  Negative for double vision.  Respiratory:  Negative for cough, hemoptysis, sputum production and wheezing.   Cardiovascular:  Negative for chest pain, palpitations, orthopnea and leg swelling.  Gastrointestinal:  Negative for abdominal pain, blood in stool, constipation and melena.  Genitourinary:  Negative for dysuria, frequency and urgency.  Musculoskeletal:  Negative for back pain and joint pain.  Neurological:  Positive for tingling. Negative for dizziness, focal weakness, weakness and headaches.  Endo/Heme/Allergies:  Does not bruise/bleed easily.  Psychiatric/Behavioral:  Negative for depression. The patient is not nervous/anxious and does not have insomnia.     MEDICAL HISTORY:  Past Medical History:  Diagnosis Date   Cancer ([facility] 2021   right breast   Cyst, breast    benign   Diabetes mellitus without complication [place]  Family history of melanoma    Family history of ovarian cancer    GERD (gastroesophageal reflux disease)    H/O: rheumatic fever    History of  colonoscopy 2010   done bc of bleeding,  normal , due back in 5 yrs (Ashlee Cox)   Hyperlipidemia    Hypertension    Menopause    at age 68   Personal history of chemotherapy 09/2019   RIGHT  INVASIVE MAMMARY CARCINOMA   Personal history of radiation therapy     SURGICAL HISTORY: Past Surgical History:  Procedure Laterality Date   APPENDECTOMY  2006   BREAST BIOPSY Right 09/22/2019   INVASIVE MAMMARY CARCINOMA   BREAST CYST ASPIRATION Left 1999   BREAST LUMPECTOMY Left 04/23/2020   surgery with NL and SN    BREAST LUMPECTOMY WITH NEEDLE LOCALIZATION Right 04/23/2020   Procedure: BREAST LUMPECTOMY WITH NEEDLE LOCALIZATION;  Surgeon: Ashlee Bellow, MD;  Location: [facility] ORS;  Service: General;  Laterality: Right;   BREAST LUMPECTOMY WITH SENTINEL LYMPH NODE BIOPSY Right 04/23/2020   Procedure: BREAST LUMPECTOMY WITH SENTINEL LYMPH NODE BX;  Surgeon: Ashlee Bellow, MD;  Location: [facility] ORS;  Service: General;  Laterality: Right;   CHOLECYSTECTOMY  2006   COLONOSCOPY     PORTACATH PLACEMENT Left 10/06/2019   Procedure: INSERTION PORT-A-CATH;  Surgeon: Ashlee Bellow, MD;  Location: [facility] ORS;  Service: General;  Laterality: Left;   SUBMUCOSAL TATTOO INJECTION Right 10/06/2019   Procedure: Right axillary TATTOO INJECTION;  Surgeon: Ashlee Bellow, MD;  Location: [facility] ORS;  Service: General;  Laterality: Right;   VAGINAL DELIVERY     x3    SOCIAL HISTORY: Social History   Socioeconomic History   Marital status: Married    Spouse name: Ashlee Cox   Number of children: 3   Years of education: Not on file   Highest education level: Not on file  Occupational History   Occupation: Glass blower/designer: [employer]  Tobacco Use   Smoking status: Former    Years: 1.00    Types: Cigarettes    Quit date: 12/26/2005    Years since quitting: 15.0   Smokeless tobacco: Never   Tobacco comments:    smoked for less than 1 years,  1 cig/day  Vaping Use   Vaping Use: Never used   Substance and Sexual Activity   Alcohol use: No   Drug use: No   Sexual activity: Never    Partners: Female  Other Topics Concern   Not on file  Social History Narrative   Widowed, March 2014; remarried.      [employer] ret[employer]; quit smoking 1ppw; no alcohol.       Social Determinants of Health   Financial Resource Strain: Not on file  Food Insecurity: Not on file  Transportation Needs: Not on file  Physical Activity: Not on file  Stress: Not on file  Social Connections: Not on file  Intimate Partner Violence: Not on file    FAMILY HISTORY: Family History  Problem Relation Age of Onset   Cancer Mother 34       ovarian- died 4-5 years.    Heart disease Mother 16   Diabetes Mother    Stroke Father 47   Diabetes Father    Diabetes Sister    Melanoma Sister        survived   Breast cancer Neg Hx     ALLERGIES:  has No Known Allergies.  MEDICATIONS:  Current Outpatient Medications  Medication Sig Dispense Refill   acetaminophen (TYLENOL) 500 MG tablet Take 500 mg by mouth every 8 (eight) hours as needed for moderate pain.     Biotin 1000 MCG tablet Take 1,000 mcg by mouth daily.     gabapentin (NEURONTIN) 100 MG capsule Take 2 capsules (200 mg total) by mouth at bedtime. 60 capsule 6   glipiZIDE (GLUCOTROL) 5 MG tablet Take 1 tablet (5 mg total) by mouth 2 (two) times daily before a meal. 60 tablet 1   losartan-hydrochlorothiazide (HYZAAR) 50-12.5 MG tablet Take 1 tablet by mouth once daily 90 tablet 0   lovastatin (MEVACOR) 40 MG tablet TAKE 1 TABLET BY MOUTH AT BEDTIME 90 tablet 0   metFORMIN (GLUCOPHAGE) 1000 MG tablet Take 2 tablets (2,000 mg total) by mouth daily with breakfast 180 tablet 1   Multiple Vitamin (MULTIVITAMIN) tablet Take 1 tablet by mouth daily.     omeprazole (PRILOSEC) 20 MG capsule Take 1 capsule by mouth once daily 90 capsule 0   capecitabine (XELODA) 500 MG tablet Take 4 pills in AM: and 4 pills in PM- 2 weeks-ON and 1 week OFF (Patient not taking:  Reported on 01/07/2021) 112 tablet 6   No current facility-administered medications for this visit.      Marland Kitchen  PHYSICAL EXAMINATION: ECOG PERFORMANCE STATUS: 0 - Asymptomatic  Vitals:   01/07/21 1500  BP: (!) 152/70  Pulse: 95  Resp: 18  Temp: 98.7 F (37.1 C)   Filed Weights   01/07/21 1500  Weight: 191 lb (86.6 kg)    Physical Exam Constitutional:      Comments: Ambulating independently.  Alone.  HENT:     Head: Normocephalic and atraumatic.     Mouth/Throat:     Pharynx: No oropharyngeal exudate.  Eyes:     Pupils: Pupils are equal, round, and reactive to light.  Cardiovascular:     Rate and Rhythm: Normal rate and regular rhythm.  Pulmonary:     Effort: Pulmonary effort is normal. No respiratory distress.     Breath sounds: Normal breath sounds. No wheezing.  Abdominal:     General: Bowel sounds are normal. There is no distension.     Palpations: Abdomen is soft. There is no mass.     Tenderness: There is no abdominal tenderness. There is no guarding or rebound.  Musculoskeletal:        General: No tenderness. Normal range of motion.     Cervical back: Normal range of motion and neck supple.  Skin:    General: Skin is warm.  Neurological:     Mental Status: She is alert and oriented to person, place, and time.  Psychiatric:        Mood and Affect: Affect normal.     LABORATORY DATA:  I have reviewed the data as listed Lab Results  Component Value Date   WBC 5.4 01/07/2021   HGB 11.5 (L) 01/07/2021   HCT 34.6 (L) 01/07/2021   MCV 94.3 01/07/2021   PLT 154 01/07/2021   Recent Labs    10/25/20 1030 11/15/20 1453 01/07/21 1452  NA 137 136 135  K 3.4* 3.6 3.5  CL 103 102 101  CO2 21* 25 24  GLUCOSE 132* 187* 299*  BUN 15 10 13   CREATININE 0.74 0.75 0.64  CALCIUM 9.2 9.3 9.2  GFRNONAA >60 >60 >60  PROT 7.1 7.6 7.6  ALBUMIN 3.9 4.1 4.0  AST 91* 74* 128*  ALT 65* 48* 66*  ALKPHOS 77 86  125  BILITOT 0.7 1.2 1.0    RADIOGRAPHIC STUDIES: I  have personally reviewed the radiological images as listed and agreed with the findings in the report. No results found.  ASSESSMENT & PLAN:   Carcinoma of upper-inner quadrant of right breast in female, estrogen receptor negative ([place]) # Right breast cancer-Stage IIB- T2N1-TRIPLE NEGATIVE;  s/p neoadjuvant therapy carbo platinum-Taxol; Adriamycin Cytoxan- ypT1ypN1. s/p adjuvant radiation. Currently s/p adjuvant xeloda x 6 cylces.   #We will plan at baseline CT chest abdomen pelvis in 1 month-we will evaluate the LFTs.  #August 2022 mammogram-right-sided calcifications-suspect radiation/surgery changes rather than malignancy.  Discussed with Dr. Bary Castilla.  Awaiting repeat mammogram in February 2023.  # Hand foot syndrome-Xeloda-resolved.   # PN-1-2 sec to taxol-on Neurontin 200 qhs-stable  #Mild LFT abomirnality-suspect fatty liver versus chemotherapy-ultrasound suggestive of fatty liver.  Also await CT scan.  # DM/poorly controlled- continue metformin 2000 mg q AM [Dr.Tullo]; PP- 299; on glipizide 5 mg BID.  Recommend evaluation with PCP  # Mediport: port flush every 2 to 3 months.  # DISPOSITION: # follow up in 1 months- MD; labs- cbc/cmp/ca27-29; port flush; CT prior-Dr.B     All questions were answered. The patient/family knows to call the clinic with any problems, questions or concerns.   Ashlee Sickle, MD 01/07/2021 3:56 PM

## 2021-01-10 ENCOUNTER — Other Ambulatory Visit: Payer: Self-pay | Admitting: Internal Medicine

## 2021-01-25 DIAGNOSIS — Z1211 Encounter for screening for malignant neoplasm of colon: Secondary | ICD-10-CM | POA: Diagnosis not present

## 2021-02-02 ENCOUNTER — Ambulatory Visit
Admission: RE | Admit: 2021-02-02 | Discharge: 2021-02-02 | Disposition: A | Discharge status: Home / Self Care | Payer: PPO | Source: Ambulatory Visit | Attending: Radiation Oncology | Admitting: Radiation Oncology

## 2021-02-02 ENCOUNTER — Other Ambulatory Visit: Payer: Self-pay

## 2021-02-02 ENCOUNTER — Ambulatory Visit
Admission: RE | Admit: 2021-02-02 | Discharge: 2021-02-02 | Disposition: A | Discharge status: Home / Self Care | Payer: PPO | Source: Ambulatory Visit | Attending: Internal Medicine | Admitting: Internal Medicine

## 2021-02-02 ENCOUNTER — Encounter: Payer: Self-pay | Admitting: Radiation Oncology

## 2021-02-02 VITALS — BP 133/78 | HR 94 | Temp 97.8°F | Resp 18 | Wt 190.5 lb

## 2021-02-02 DIAGNOSIS — Z9221 Personal history of antineoplastic chemotherapy: Secondary | ICD-10-CM | POA: Insufficient documentation

## 2021-02-02 DIAGNOSIS — Z171 Estrogen receptor negative status [ER-]: Secondary | ICD-10-CM | POA: Insufficient documentation

## 2021-02-02 DIAGNOSIS — C50211 Malignant neoplasm of upper-inner quadrant of right female breast: Secondary | ICD-10-CM | POA: Insufficient documentation

## 2021-02-02 DIAGNOSIS — C50919 Malignant neoplasm of unspecified site of unspecified female breast: Secondary | ICD-10-CM | POA: Diagnosis not present

## 2021-02-02 DIAGNOSIS — Z08 Encounter for follow-up examination after completed treatment for malignant neoplasm: Secondary | ICD-10-CM | POA: Diagnosis not present

## 2021-02-02 LAB — COLOGUARD: COLOGUARD: NEGATIVE

## 2021-02-02 MED ORDER — IOHEXOL 300 MG/ML  SOLN
100.0000 mL | Freq: Once | INTRAMUSCULAR | Status: AC | PRN
  Administered 2021-02-02: 100 mL via INTRAVENOUS

## 2021-02-02 NOTE — Progress Notes (Signed)
Radiation Oncology Follow up Note  Name: Ashlee Cox   Date:   02/02/2021 MRN:  [id number] DOB: [birth year]    This 70 y.o. female presents to the clinic today for 52-month follow-up status post whole breast radiation to her right breast for stage IIb (T2 N1 M0) invasive mammary carcinoma triple negative.Marland Kitchen  REFERRING PROVIDER: [name], MD  HPI: Patient is a 70 year old female now out 6 months having completed whole breast radiation to her right breast for stage IIb invasive mammary carcinoma triple negative seen today in routine follow-up she is doing well.  She specifically denies breast tenderness cough or bone pain.  She is not on antiestrogen therapy based on the triple negative nature of her disease..  Patient also had undergone neoadjuvant chemotherapy is currently on Xeloda x6 cycles.  She did have mammograms back in August showing some right-sided calcifications consistent with either radiation or surgery changes.  This is being followed with repeat mammogram in February 23 by Dr. Tollie Pizza.  COMPLICATIONS OF TREATMENT: none  FOLLOW UP COMPLIANCE: keeps appointments   PHYSICAL EXAM:  BP 133/78 (BP Location: Right Arm, Patient Position: Sitting, Cuff Size: Normal)   Pulse 94   Temp 97.8 F (36.6 C) (Tympanic)   Resp 18   Wt 190 lb 8 oz (86.4 kg)   SpO2 97%   BMI 29.84 kg/m  Lungs are clear to A&P cardiac examination essentially unremarkable with regular rate and rhythm. No dominant mass or nodularity is noted in either breast in 2 positions examined. Incision is well-healed. No axillary or supraclavicular adenopathy is appreciated. Cosmetic result is excellent.  Well-developed well-nourished patient in NAD. HEENT reveals PERLA, EOMI, discs not visualized.  Oral cavity is clear. No oral mucosal lesions are identified. Neck is clear without evidence of cervical or supraclavicular adenopathy. Lungs are clear to A&P. Cardiac examination is essentially unremarkable with regular  rate and rhythm without murmur rub or thrill. Abdomen is benign with no organomegaly or masses noted. Motor sensory and DTR levels are equal and symmetric in the upper and lower extremities. Cranial nerves II through XII are grossly intact. Proprioception is intact. No peripheral adenopathy or edema is identified. No motor or sensory levels are noted. Crude visual fields are within normal range.  RADIOLOGY RESULTS: Mammograms reviewed compatible with above-stated findings  PLAN: Present time patient is doing well she have repeat mammograms in February and this is being followed by Dr. Tollie Pizza.  I agree with this treatment plan.  Otherwise she continues to do well.  I have asked to see her back in 6 months for follow-up.  Patient knows to call with any concerns.  I would like to take this opportunity to thank you for allowing me to participate in the care of your patient.Noreene Filbert, MD

## 2021-02-07 ENCOUNTER — Other Ambulatory Visit: Payer: Self-pay

## 2021-02-07 ENCOUNTER — Encounter: Payer: Self-pay | Admitting: Internal Medicine

## 2021-02-07 ENCOUNTER — Inpatient Hospital Stay: Payer: PPO | Attending: Internal Medicine

## 2021-02-07 ENCOUNTER — Inpatient Hospital Stay: Payer: PPO | Admitting: Internal Medicine

## 2021-02-07 DIAGNOSIS — E1165 Type 2 diabetes mellitus with hyperglycemia: Secondary | ICD-10-CM | POA: Insufficient documentation

## 2021-02-07 DIAGNOSIS — C50211 Malignant neoplasm of upper-inner quadrant of right female breast: Secondary | ICD-10-CM

## 2021-02-07 DIAGNOSIS — Z171 Estrogen receptor negative status [ER-]: Secondary | ICD-10-CM | POA: Diagnosis not present

## 2021-02-07 DIAGNOSIS — Z7984 Long term (current) use of oral hypoglycemic drugs: Secondary | ICD-10-CM | POA: Diagnosis not present

## 2021-02-07 DIAGNOSIS — Z452 Encounter for adjustment and management of vascular access device: Secondary | ICD-10-CM | POA: Insufficient documentation

## 2021-02-07 DIAGNOSIS — Z95828 Presence of other vascular implants and grafts: Secondary | ICD-10-CM

## 2021-02-07 LAB — COMPREHENSIVE METABOLIC PANEL
ALT: 47 U/L — ABNORMAL HIGH (ref 0–44)
AST: 59 U/L — ABNORMAL HIGH (ref 15–41)
Albumin: 3.9 g/dL (ref 3.5–5.0)
Alkaline Phosphatase: 99 U/L (ref 38–126)
Anion gap: 8 (ref 5–15)
BUN: 13 mg/dL (ref 8–23)
CO2: 28 mmol/L (ref 22–32)
Calcium: 9.3 mg/dL (ref 8.9–10.3)
Chloride: 99 mmol/L (ref 98–111)
Creatinine, Ser: 0.65 mg/dL (ref 0.44–1.00)
GFR, Estimated: >60 mL/min (ref >60)
Glucose, Bld: 213 mg/dL — ABNORMAL HIGH (ref 70–99)
Potassium: 3.9 mmol/L (ref 3.5–5.1)
Sodium: 135 mmol/L (ref 135–145)
Total Bilirubin: 0.5 mg/dL (ref 0.3–1.2)
Total Protein: 7.9 g/dL (ref 6.5–8.1)

## 2021-02-07 LAB — CBC WITH DIFFERENTIAL/PLATELET
Abs Immature Granulocytes: 0.01 10*3/uL (ref 0.00–0.07)
Basophils Absolute: 0 10*3/uL (ref 0.0–0.1)
Basophils Relative: 1 %
Eosinophils Absolute: 0.1 10*3/uL (ref 0.0–0.5)
Eosinophils Relative: 2 %
HCT: 35.8 % — ABNORMAL LOW (ref 36.0–46.0)
Hemoglobin: 11.7 g/dL — ABNORMAL LOW (ref 12.0–15.0)
Immature Granulocytes: 0 %
Lymphocytes Relative: 23 %
Lymphs Abs: 0.9 10*3/uL (ref 0.7–4.0)
MCH: 29.1 pg (ref 26.0–34.0)
MCHC: 32.7 g/dL (ref 30.0–36.0)
MCV: 89.1 fL (ref 80.0–100.0)
Monocytes Absolute: 0.4 10*3/uL (ref 0.1–1.0)
Monocytes Relative: 9 %
Neutro Abs: 2.7 10*3/uL (ref 1.7–7.7)
Neutrophils Relative %: 65 %
Platelets: 156 10*3/uL (ref 150–400)
RBC: 4.02 MIL/uL (ref 3.87–5.11)
RDW: 14 % (ref 11.5–15.5)
WBC: 4.1 10*3/uL (ref 4.0–10.5)
nRBC: 0 % (ref 0.0–0.2)

## 2021-02-07 MED ORDER — HEPARIN SOD (PORK) LOCK FLUSH 100 UNIT/ML IV SOLN
500.0000 [IU] | Freq: Once | INTRAVENOUS | Status: AC
  Filled 2021-02-07: qty 5

## 2021-02-07 MED ORDER — SODIUM CHLORIDE 0.9% FLUSH
10.0000 mL | INTRAVENOUS | Status: DC | PRN
  Administered 2021-02-07: 10 mL via INTRAVENOUS
  Filled 2021-02-07: qty 10

## 2021-02-07 MED ORDER — HEPARIN SOD (PORK) LOCK FLUSH 100 UNIT/ML IV SOLN
INTRAVENOUS | Status: AC
  Administered 2021-02-07: 500 [IU] via INTRAVENOUS
  Filled 2021-02-07: qty 5

## 2021-02-07 NOTE — Progress Notes (Signed)
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 Byrnett.] [id number] Carbo-taxol-AC;  JAN 2022-s/p Lumpectomy & ALND- [ypT1a (1m ) ypN1 (2/11 LN-positive)]; s/p radiation end of April 2022.  #Early June 2022-Xeloda 1000 mg per metered square [2000] 2 weeks on 1 week of 6-8 cycles; July 11th 2022- STARTING cycle #3-cut down the dose to 1500 mg BID[sec to HFS].  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 No pathogenic variants identified on the [facility] Common Hereditary Cancers Panel + Skin Cancers Panel. The report date is 12/04/2019.  The Common Hereditary Cancers Panel + Skin Cancers Panel offered by [facility] includes sequencing and/or deletion duplication testing of the following 54 genes: APC*, ATM*, AXIN2, BAP1, BARD1, BMPR1A, BRCA1, BRCA2, BRIP1, CDH1, CDK4, CDKN2A (p14ARF), CDKN2A (p16INK4a), CHEK2, CTNNA1, DICER1*, EPCAM*, GREM1*, HOXB13, KIT, MEN1*, MITF*, MLH1*, MSH2*, MSH3*, MSH6*, MUTYH, NBN, NF1*, NTHL1, PALB2, PDGFRA, PMS2*, POLD1*, POLE, POT1, PTCH1, PTEN*, RAD50, RAD51C, RAD51D, RB1*, RNF43, SDHA*, SDHB, SDHC*, SDHD,  SMAD4, SMARCA4, STK11, SUFU, TP53, TSC1*, TSC2, VHL.    05/07/2020 Cancer Staging   Staging form: Breast, AJCC 8th Edition - Clinical: Stage IIIB (cT2, cN1, cM0, G3, ER-, PR-, HER2-) - Signed by BCammie Sickle MD on 05/07/2020       HISTORY OF PRESENTING ILLNESS: Accompanied by husband.  Walking independently. CMarney TreloarBumgarner 70y.o.  female patient with triple negative breast cancer stage IIb -currently s/p adjuvant Xeloda is here for follow-up/review results of the CT scan.  Complains of mild left neck/shoulder pain.  Improved with taking Tylenol.  Otherwise no shortness of breath or cough.  Chronic mild tingling and numbness in extremities.  Review of Systems  Constitutional:  Positive for malaise/fatigue. Negative for chills, diaphoresis, fever and weight loss.  HENT:  Negative for nosebleeds and sore throat.   Eyes:  Negative for double vision.  Respiratory:  Negative for cough, hemoptysis, sputum production and wheezing.   Cardiovascular:  Negative for chest pain, palpitations, orthopnea and leg swelling.  Gastrointestinal:  Negative for abdominal pain, blood in stool, constipation and melena.  Genitourinary:  Negative for dysuria, frequency and urgency.  Musculoskeletal:  Negative for back pain and joint pain.  Neurological:  Positive for tingling. Negative for dizziness, focal weakness, weakness and headaches.  Endo/Heme/Allergies:  Does not bruise/bleed easily.  Psychiatric/Behavioral:  Negative for depression. The patient is not nervous/anxious and does not have insomnia.     MEDICAL HISTORY:  Past Medical History:  Diagnosis Date   Cancer ([facility] 2021   right breast   Cyst, breast    benign   Diabetes mellitus without complication ([place]    Family history of melanoma    Family history of ovarian cancer  GERD (gastroesophageal reflux disease)    H/O: rheumatic fever    History of colonoscopy 2010   done bc of bleeding,  normal , due back in 5 yrs  ([place])   Hyperlipidemia    Hypertension    Menopause    at age 64   Personal history of chemotherapy 09/2019   RIGHT  INVASIVE MAMMARY CARCINOMA   Personal history of radiation therapy     SURGICAL HISTORY: Past Surgical History:  Procedure Laterality Date   APPENDECTOMY  2006   BREAST BIOPSY Right 09/22/2019   INVASIVE MAMMARY CARCINOMA   BREAST CYST ASPIRATION Left 1999   BREAST LUMPECTOMY Left 04/23/2020   surgery with NL and SN    BREAST LUMPECTOMY WITH NEEDLE LOCALIZATION Right 04/23/2020   Procedure: BREAST LUMPECTOMY WITH NEEDLE LOCALIZATION;  Surgeon: Robert Bellow, MD;  Location: [facility] ORS;  Service: General;  Laterality: Right;   BREAST LUMPECTOMY WITH SENTINEL LYMPH NODE BIOPSY Right 04/23/2020   Procedure: BREAST LUMPECTOMY WITH SENTINEL LYMPH NODE BX;  Surgeon: Robert Bellow, MD;  Location: [facility] ORS;  Service: General;  Laterality: Right;   CHOLECYSTECTOMY  2006   COLONOSCOPY     PORTACATH PLACEMENT Left 10/06/2019   Procedure: INSERTION PORT-A-CATH;  Surgeon: Robert Bellow, MD;  Location: [facility] ORS;  Service: General;  Laterality: Left;   SUBMUCOSAL TATTOO INJECTION Right 10/06/2019   Procedure: Right axillary TATTOO INJECTION;  Surgeon: Robert Bellow, MD;  Location: [facility] ORS;  Service: General;  Laterality: Right;   VAGINAL DELIVERY     x3    SOCIAL HISTORY: Social History   Socioeconomic History   Marital status: Married    Spouse name: Chrissie Noa   Number of children: 3   Years of education: Not on file   Highest education level: Not on file  Occupational History   Occupation: Glass blower/designer: [employer]  Tobacco Use   Smoking status: Former    Years: 1.00    Types: Cigarettes    Quit date: 12/26/2005    Years since quitting: 15.1   Smokeless tobacco: Never   Tobacco comments:    smoked for less than 1 years,  1 cig/day  Vaping Use   Vaping Use: Never used  Substance and Sexual Activity   Alcohol use: No   Drug use: No    Sexual activity: Never    Partners: Female  Other Topics Concern   Not on file  Social History Narrative   Widowed, March 2014; remarried.      [employer] ret[employer]; quit smoking 1ppw; no alcohol.       Social Determinants of Health   Financial Resource Strain: Not on file  Food Insecurity: Not on file  Transportation Needs: Not on file  Physical Activity: Not on file  Stress: Not on file  Social Connections: Not on file  Intimate Partner Violence: Not on file    FAMILY HISTORY: Family History  Problem Relation Age of Onset   Cancer Mother 61       ovarian- died 4-5 years.    Heart disease Mother 45   Diabetes Mother    Stroke Father 61   Diabetes Father    Diabetes Sister    Melanoma Sister        survived   Breast cancer Neg Hx     ALLERGIES:  has No Known Allergies.  MEDICATIONS:  Current Outpatient Medications  Medication Sig Dispense Refill   acetaminophen (TYLENOL) 500 MG tablet Take 500 mg by  mouth every 8 (eight) hours as needed for moderate pain.     gabapentin (NEURONTIN) 100 MG capsule Take 2 capsules (200 mg total) by mouth at bedtime. 60 capsule 6   glipiZIDE (GLUCOTROL) 5 MG tablet Take 1 tablet (5 mg total) by mouth 2 (two) times daily before a meal. 60 tablet 1   losartan-hydrochlorothiazide (HYZAAR) 50-12.5 MG tablet Take 1 tablet by mouth once daily 90 tablet 0   lovastatin (MEVACOR) 40 MG tablet TAKE 1 TABLET BY MOUTH AT BEDTIME 90 tablet 0   metFORMIN (GLUCOPHAGE) 1000 MG tablet Take 2 tablets (2,000 mg total) by mouth daily with breakfast 180 tablet 1   Multiple Vitamin (MULTIVITAMIN) tablet Take 1 tablet by mouth daily.     omeprazole (PRILOSEC) 20 MG capsule Take 1 capsule by mouth once daily 90 capsule 0   Biotin 1000 MCG tablet Take 1,000 mcg by mouth daily. (Patient not taking: Reported on 02/07/2021)     capecitabine (XELODA) 500 MG tablet Take 4 pills in AM: and 4 pills in PM- 2 weeks-ON and 1 week OFF (Patient not taking: No sig reported) 112  tablet 6   No current facility-administered medications for this visit.      Marland Kitchen  PHYSICAL EXAMINATION: ECOG PERFORMANCE STATUS: 0 - Asymptomatic  Vitals:   02/07/21 1008  BP: 133/76  Pulse: 88  Resp: 16  Temp: 98.5 F (36.9 C)   Filed Weights   02/07/21 1008  Weight: 190 lb 6.4 oz (86.4 kg)    Physical Exam Constitutional:      Comments: Ambulating independently.  Alone.  HENT:     Head: Normocephalic and atraumatic.     Mouth/Throat:     Pharynx: No oropharyngeal exudate.  Eyes:     Pupils: Pupils are equal, round, and reactive to light.  Cardiovascular:     Rate and Rhythm: Normal rate and regular rhythm.  Pulmonary:     Effort: Pulmonary effort is normal. No respiratory distress.     Breath sounds: Normal breath sounds. No wheezing.  Abdominal:     General: Bowel sounds are normal. There is no distension.     Palpations: Abdomen is soft. There is no mass.     Tenderness: There is no abdominal tenderness. There is no guarding or rebound.  Musculoskeletal:        General: No tenderness. Normal range of motion.     Cervical back: Normal range of motion and neck supple.  Skin:    General: Skin is warm.  Neurological:     Mental Status: She is alert and oriented to person, place, and time.  Psychiatric:        Mood and Affect: Affect normal.     LABORATORY DATA:  I have reviewed the data as listed Lab Results  Component Value Date   WBC 4.1 02/07/2021   HGB 11.7 (L) 02/07/2021   HCT 35.8 (L) 02/07/2021   MCV 89.1 02/07/2021   PLT 156 02/07/2021   Recent Labs    11/15/20 1453 01/07/21 1452 02/07/21 0928  NA 136 135 135  K 3.6 3.5 3.9  CL 102 101 99  CO2 25 24 28   GLUCOSE 187* 299* 213*  BUN 10 13 13   CREATININE 0.75 0.64 0.65  CALCIUM 9.3 9.2 9.3  GFRNONAA >60 >60 >60  PROT 7.6 7.6 7.9  ALBUMIN 4.1 4.0 3.9  AST 74* 128* 59*  ALT 48* 66* 47*  ALKPHOS 86 125 99  BILITOT 1.2 1.0 0.5  RADIOGRAPHIC STUDIES: I have personally reviewed the  radiological images as listed and agreed with the findings in the report. CT CHEST ABDOMEN PELVIS W CONTRAST  Result Date: 02/02/2021 CLINICAL DATA:  Breast cancer.  Restaging. EXAM: CT CHEST, ABDOMEN, AND PELVIS WITH CONTRAST TECHNIQUE: Multidetector CT imaging of the chest, abdomen and pelvis was performed following the standard protocol during bolus administration of intravenous contrast. CONTRAST:  174m OMNIPAQUE IOHEXOL 300 MG/ML  SOLN COMPARISON:  PET-CT 10/03/2019 FINDINGS: CT CHEST FINDINGS Cardiovascular: The heart size is normal. No substantial pericardial effusion. Mild atherosclerotic calcification is noted in the wall of the thoracic aorta. Left Port-A-Cath tip is positioned at the SVC/RA junction. Mediastinum/Nodes: No mediastinal lymphadenopathy. Scattered small and upper normal mediastinal lymph nodes are stable since prior. No evidence for gross hilar lymphadenopathy although assessment is limited by the lack of intravenous contrast on the current study. Mild circumferential wall thickening noted distal esophagus. There is no axillary lymphadenopathy. Lungs/Pleura: Subpleural reticulation towards the right apex is likely related to scarring, potentially from radiation therapy. No suspicious pulmonary nodule or mass. No focal airspace consolidation. No pleural effusion. Musculoskeletal: No worrisome lytic or sclerotic osseous abnormality. CT ABDOMEN PELVIS FINDINGS Hepatobiliary: No suspicious focal abnormality within the liver parenchyma. Gallbladder surgically absent. No intrahepatic or extrahepatic biliary dilation. Pancreas: No focal mass lesion. No dilatation of the main duct. No intraparenchymal cyst. No peripancreatic edema. Spleen: No splenomegaly. No focal mass lesion. Adrenals/Urinary Tract: No adrenal nodule or mass. Kidneys unremarkable. No evidence for hydroureter. The urinary bladder appears normal for the degree of distention. Stomach/Bowel: Stomach is unremarkable. No gastric wall  thickening. No evidence of outlet obstruction. Duodenum is normally positioned as is the ligament of Treitz. No small bowel wall thickening. No small bowel dilatation. The terminal ileum is normal. The appendix is not well visualized, but there is no edema or inflammation in the region of the cecum. No gross colonic mass. No colonic wall thickening. Vascular/Lymphatic: There is mild atherosclerotic calcification of the abdominal aorta without aneurysm. There is no gastrohepatic or hepatoduodenal ligament lymphadenopathy. No retroperitoneal or mesenteric lymphadenopathy. No pelvic sidewall lymphadenopathy. Reproductive: The uterus is unremarkable.  There is no adnexal mass. Other: No intraperitoneal free fluid. Musculoskeletal: No worrisome lytic or sclerotic osseous abnormality. Bilateral pars interarticularis defects noted at L5. IMPRESSION: 1. No evidence for metastatic disease in the chest, abdomen, or pelvis. 2. Mild circumferential wall thickening noted distal esophagus. Esophagitis would be a consideration. 3. Aortic Atherosclerosis (ICD10-I70.0). Electronically Signed   By: EMisty StanleyM.D.   On: 02/02/2021 11:43    ASSESSMENT & PLAN:   Carcinoma of upper-inner quadrant of right breast in female, estrogen receptor negative ([place] # Right breast cancer-Stage IIB- T2N1-TRIPLE NEGATIVE;  s/p neoadjuvant therapy carbo platinum-Taxol; Adriamycin Cytoxan- ypT1ypN1. s/p adjuvant radiation. Currently s/p adjuvant xeloda x 6 cylces; NOV 9th, 2022- CT scan-no evidence of any metastatic disease.  Continue surveillance at this time.   #August 2022 mammogram-right-sided calcifications-suspect radiation/surgery changes rather than malignancy.  Discussed with Dr. BBary Castilla  Awaiting repeat mammogram in February 2023; STABLE.   # PN-1-2 sec to taxol-on Neurontin 200 qhs-stable  # Mild LFT abomirnality-suspect fatty liver versus chemotherapy-ultrasound suggestive of fatty liver-  CT scan-November 2022 no evidence  of any metastatic disease.  # DM/poorly controlled- continue metformin 2000 mg q AM [Dr.Tullo]; PP- 213 on glipizide 5 mg BID. Continue follow up with  PCP  # Mediport: port flush every 2 to 3 months.  # DISPOSITION: mychart # follow up  3 moths- MD; labs- cbc/cmp/ca27-29; port flush;-Dr.B  # I reviewed the blood work- with the patient in detail; also reviewed the imaging independently [as summarized above]; and with the patient in detail.       All questions were answered. The patient/family knows to call the clinic with any problems, questions or concerns.   Cammie Sickle, MD 02/07/2021 11:54 AM

## 2021-02-07 NOTE — Progress Notes (Signed)
Patient has episodes of neck pain for the past week that is 7/10 pain scale with last episode last night.

## 2021-02-07 NOTE — Assessment & Plan Note (Addendum)
#   Right breast cancer-Stage IIB- T2N1-TRIPLE NEGATIVE;  s/p neoadjuvant therapy carbo platinum-Taxol; Adriamycin Cytoxan- ypT1ypN1. s/p adjuvant radiation. Currently s/p adjuvant xeloda x 6 cylces; NOV 9th, 2022- CT scan-no evidence of any metastatic disease.  Continue surveillance at this time.   #August 2022 mammogram-right-sided calcifications-suspect radiation/surgery changes rather than malignancy.  Discussed with Dr. Bary Castilla.  Awaiting repeat mammogram in February 2023; STABLE.   # PN-1-2 sec to taxol-on Neurontin 200 qhs-stable  # Mild LFT abomirnality-suspect fatty liver versus chemotherapy-ultrasound suggestive of fatty liver-  CT scan-November 2022 no evidence of any metastatic disease.  # DM/poorly controlled- continue metformin 2000 mg q AM [Dr.Tullo]; PP- 213 on glipizide 5 mg BID. Continue follow up with  PCP  # Mediport: port flush every 2 to 3 months.  # DISPOSITION: mychart # follow up 3 moths- MD; labs- cbc/cmp/ca27-29; port flush;-Dr.B  # I reviewed the blood work- with the patient in detail; also reviewed the imaging independently [as summarized above]; and with the patient in detail.

## 2021-02-08 LAB — CANCER ANTIGEN 27.29: CA 27.29: 27.7 U/mL (ref 0.0–38.6)

## 2021-03-03 ENCOUNTER — Other Ambulatory Visit: Payer: Self-pay | Admitting: Internal Medicine

## 2021-04-05 ENCOUNTER — Other Ambulatory Visit: Payer: Self-pay | Admitting: General Surgery

## 2021-04-05 DIAGNOSIS — C50911 Malignant neoplasm of unspecified site of right female breast: Secondary | ICD-10-CM | POA: Diagnosis not present

## 2021-04-05 DIAGNOSIS — L309 Dermatitis, unspecified: Secondary | ICD-10-CM | POA: Diagnosis not present

## 2021-04-05 DIAGNOSIS — N6315 Unspecified lump in the right breast, overlapping quadrants: Secondary | ICD-10-CM | POA: Diagnosis not present

## 2021-04-05 DIAGNOSIS — C50211 Malignant neoplasm of upper-inner quadrant of right female breast: Secondary | ICD-10-CM | POA: Diagnosis not present

## 2021-04-05 DIAGNOSIS — Z171 Estrogen receptor negative status [ER-]: Secondary | ICD-10-CM | POA: Diagnosis not present

## 2021-04-05 DIAGNOSIS — Z170 Estrogen receptor positive status [ER+]: Secondary | ICD-10-CM | POA: Diagnosis not present

## 2021-04-06 ENCOUNTER — Telehealth: Payer: Self-pay

## 2021-04-06 NOTE — Telephone Encounter (Signed)
Dr. Rogue Bussing would like to add ER/PR/HER2 to Accession [id number].  Request has been completed and faxed [place] at [facility] pathology.

## 2021-04-07 ENCOUNTER — Telehealth: Payer: Self-pay | Admitting: Internal Medicine

## 2021-04-07 ENCOUNTER — Other Ambulatory Visit: Payer: Self-pay

## 2021-04-07 DIAGNOSIS — C50211 Malignant neoplasm of upper-inner quadrant of right female breast: Secondary | ICD-10-CM

## 2021-04-07 DIAGNOSIS — Z171 Estrogen receptor negative status [ER-]: Secondary | ICD-10-CM

## 2021-04-07 NOTE — Telephone Encounter (Signed)
I spoke to patient regarding the results of the recent pathology-positive for cancer.  Recommend a PET scan for further evaluation/labs.  C-please schedule PET scan stat; and follow-up with me on 1/20 at 8:30- labs- cbc/cmp/ca-27-29;CEA; Ca 15-3/port flush.   Cancel February appointments.   GB

## 2021-04-11 ENCOUNTER — Other Ambulatory Visit: Payer: Self-pay | Admitting: Internal Medicine

## 2021-04-11 DIAGNOSIS — G620 Drug-induced polyneuropathy: Secondary | ICD-10-CM

## 2021-04-12 ENCOUNTER — Other Ambulatory Visit: Payer: Self-pay | Admitting: Internal Medicine

## 2021-04-12 LAB — SURGICAL PATHOLOGY

## 2021-04-13 ENCOUNTER — Encounter: Payer: Self-pay | Admitting: Internal Medicine

## 2021-04-15 ENCOUNTER — Encounter
Admission: RE | Admit: 2021-04-15 | Discharge: 2021-04-15 | Disposition: A | Discharge status: Home / Self Care | Payer: PPO | Source: Ambulatory Visit | Attending: Internal Medicine | Admitting: Internal Medicine

## 2021-04-15 ENCOUNTER — Ambulatory Visit: Payer: PPO | Admitting: Internal Medicine

## 2021-04-15 ENCOUNTER — Other Ambulatory Visit: Payer: PPO

## 2021-04-15 ENCOUNTER — Other Ambulatory Visit: Payer: Self-pay

## 2021-04-15 DIAGNOSIS — C50211 Malignant neoplasm of upper-inner quadrant of right female breast: Secondary | ICD-10-CM | POA: Insufficient documentation

## 2021-04-15 DIAGNOSIS — K449 Diaphragmatic hernia without obstruction or gangrene: Secondary | ICD-10-CM | POA: Diagnosis not present

## 2021-04-15 DIAGNOSIS — Z171 Estrogen receptor negative status [ER-]: Secondary | ICD-10-CM | POA: Diagnosis not present

## 2021-04-15 DIAGNOSIS — C50911 Malignant neoplasm of unspecified site of right female breast: Secondary | ICD-10-CM | POA: Diagnosis not present

## 2021-04-15 DIAGNOSIS — K760 Fatty (change of) liver, not elsewhere classified: Secondary | ICD-10-CM | POA: Diagnosis not present

## 2021-04-15 DIAGNOSIS — M4319 Spondylolisthesis, multiple sites in spine: Secondary | ICD-10-CM | POA: Diagnosis not present

## 2021-04-15 LAB — GLUCOSE, CAPILLARY: Glucose-Capillary: 204 mg/dL — ABNORMAL HIGH (ref 70–99)

## 2021-04-15 MED ORDER — FLUDEOXYGLUCOSE F - 18 (FDG) INJECTION
9.6700 | Freq: Once | INTRAVENOUS | Status: AC | PRN
  Administered 2021-04-15: 9.67 via INTRAVENOUS

## 2021-04-18 ENCOUNTER — Inpatient Hospital Stay: Payer: PPO | Attending: Internal Medicine

## 2021-04-18 ENCOUNTER — Other Ambulatory Visit: Payer: Self-pay

## 2021-04-18 ENCOUNTER — Inpatient Hospital Stay (HOSPITAL_BASED_OUTPATIENT_CLINIC_OR_DEPARTMENT_OTHER): Payer: PPO | Admitting: Internal Medicine

## 2021-04-18 ENCOUNTER — Ambulatory Visit
Admission: RE | Admit: 2021-04-18 | Discharge: 2021-04-18 | Disposition: A | Discharge status: Home / Self Care | Payer: PPO | Source: Ambulatory Visit | Attending: Internal Medicine | Admitting: Internal Medicine

## 2021-04-18 VITALS — BP 161/84 | HR 89 | Temp 97.3°F | Wt 191.6 lb

## 2021-04-18 DIAGNOSIS — C50919 Malignant neoplasm of unspecified site of unspecified female breast: Secondary | ICD-10-CM | POA: Diagnosis not present

## 2021-04-18 DIAGNOSIS — E1165 Type 2 diabetes mellitus with hyperglycemia: Secondary | ICD-10-CM | POA: Insufficient documentation

## 2021-04-18 DIAGNOSIS — C50211 Malignant neoplasm of upper-inner quadrant of right female breast: Secondary | ICD-10-CM

## 2021-04-18 DIAGNOSIS — G620 Drug-induced polyneuropathy: Secondary | ICD-10-CM | POA: Insufficient documentation

## 2021-04-18 DIAGNOSIS — M546 Pain in thoracic spine: Secondary | ICD-10-CM | POA: Diagnosis not present

## 2021-04-18 DIAGNOSIS — Z170 Estrogen receptor positive status [ER+]: Secondary | ICD-10-CM | POA: Insufficient documentation

## 2021-04-18 DIAGNOSIS — Z171 Estrogen receptor negative status [ER-]: Secondary | ICD-10-CM

## 2021-04-18 LAB — CBC WITH DIFFERENTIAL/PLATELET
Abs Immature Granulocytes: 0.02 10*3/uL (ref 0.00–0.07)
Basophils Absolute: 0 10*3/uL (ref 0.0–0.1)
Basophils Relative: 1 %
Eosinophils Absolute: 0.1 10*3/uL (ref 0.0–0.5)
Eosinophils Relative: 2 %
HCT: 36.4 % (ref 36.0–46.0)
Hemoglobin: 11.7 g/dL — ABNORMAL LOW (ref 12.0–15.0)
Immature Granulocytes: 0 %
Lymphocytes Relative: 20 %
Lymphs Abs: 1.1 10*3/uL (ref 0.7–4.0)
MCH: 26.7 pg (ref 26.0–34.0)
MCHC: 32.1 g/dL (ref 30.0–36.0)
MCV: 83.1 fL (ref 80.0–100.0)
Monocytes Absolute: 0.4 10*3/uL (ref 0.1–1.0)
Monocytes Relative: 7 %
Neutro Abs: 3.8 10*3/uL (ref 1.7–7.7)
Neutrophils Relative %: 70 %
Platelets: 156 10*3/uL (ref 150–400)
RBC: 4.38 MIL/uL (ref 3.87–5.11)
RDW: 14.7 % (ref 11.5–15.5)
WBC: 5.4 10*3/uL (ref 4.0–10.5)
nRBC: 0 % (ref 0.0–0.2)

## 2021-04-18 LAB — COMPREHENSIVE METABOLIC PANEL
ALT: 44 U/L (ref 0–44)
AST: 68 U/L — ABNORMAL HIGH (ref 15–41)
Albumin: 4.1 g/dL (ref 3.5–5.0)
Alkaline Phosphatase: 116 U/L (ref 38–126)
Anion gap: 13 (ref 5–15)
BUN: 17 mg/dL (ref 8–23)
CO2: 22 mmol/L (ref 22–32)
Calcium: 10 mg/dL (ref 8.9–10.3)
Chloride: 100 mmol/L (ref 98–111)
Creatinine, Ser: 0.65 mg/dL (ref 0.44–1.00)
GFR, Estimated: >60 mL/min (ref >60)
Glucose, Bld: 207 mg/dL — ABNORMAL HIGH (ref 70–99)
Potassium: 3.9 mmol/L (ref 3.5–5.1)
Sodium: 135 mmol/L (ref 135–145)
Total Bilirubin: 0.6 mg/dL (ref 0.3–1.2)
Total Protein: 8.1 g/dL (ref 6.5–8.1)

## 2021-04-18 MED ORDER — GADOBUTROL 1 MMOL/ML IV SOLN
7.5000 mL | Freq: Once | INTRAVENOUS | Status: AC | PRN
  Administered 2021-04-18: 7.5 mL via INTRAVENOUS

## 2021-04-18 NOTE — Assessment & Plan Note (Addendum)
#  Recurrent right breast cancer-s/p breast skin biopsy-week ER positive /PR HER2 negative- recurrent-highly suspicious for stage IV;Apr 15, 2021- PET scan shows hypermetabolic thoracic lymph nodes; T2 vertebral body concerning for metastatic disease; lower level hypermetabolic some cervical/supraclavicular lymph node/right breast hypermetabolic lymph at the level of previous surgery.  #A long discussion with the patient regarding/stage- IV /pathology-recurrent malignancy.  Discussed that unfortunately her disease cannot be cured.  And she will likely need indefinite therapies/systemic therapies.  Recommend starting Taxol weekly.  Need to monitor neuropathy closely. Ordered PDL-1; if positive for CPS >20-recommend addition of Keytruda with chemotherapy.  Discussed with Dr. Bary Castilla; hold off any surgical option at this time.  #T2 uptake concerning for osseous metastatic disease; recommend MRI for further evaluation. / consider zometa.   # PN-1-2 sec to taxol-on Neurontin 200 qhs-stable  # Mild LFT abomirnality-suspect fatty liver versus chemotherapy-ultrasound suggestive of fatty liver-monitor for now  # DM/poorly controlled- continue metformin 2000 mg q AM [Dr.Tullo]; PP- 213 on glipizide 5 mg BID. Continue follow up with  PCP  #Incidental findings on PET Imaging dated: JAN, 2023:5. Incidental findings, including: Aortic Atherosclerosis (ICD10-I70.0). Tiny hiatal hernia. Hepatic steatosis with nonspecific caudate lobe enlargement. I reviewed/discussed/counseled the patient.   # Mediport: Functioning.    # DISPOSITION: MRI spine- STAT # follow up 2 weeks-- MD; labs- cbc/cmp/ Taxol chemo [new];-Dr.B  # I reviewed the blood work- with the patient in detail; also reviewed the imaging independently [as summarized above]; and with the patient in detail.

## 2021-04-18 NOTE — Progress Notes (Signed)
DISCONTINUE ON PATHWAY REGIMEN - Breast     A cycle is every 21 days (cycles 1-4):     Paclitaxel      Carboplatin    A cycle is every 14 days (cycles 5-8):     Doxorubicin      Cyclophosphamide      Pegfilgrastim-xxxx   **Always confirm dose/schedule in your pharmacy ordering system**  REASON: Disease Progression PRIOR TREATMENT: [id number]:  Paclitaxel 80 mg/m2 Weekly + Carboplatin AUC=6 q21 Days x 12 Weeks, Followed by Dose-Dense AC [Doxorubicin + Cyclophosphamide q14 Days x 4 Cycles] TREATMENT RESPONSE: Progressive Disease (PD)  START ON PATHWAY REGIMEN - Breast     A cycle is every 28 days (3 weeks on and 1 week off):     Paclitaxel   **Always confirm dose/schedule in your pharmacy ordering system**  Patient Characteristics: Distant Metastases or Locoregional Recurrent Disease - Unresected or Locally Advanced Unresectable Disease Progressing after Neoadjuvant and Local Therapies, HER2 Low/Negative/Unknown, ER Negative/Unknown, Chemotherapy, HER2 Negative/Unknown, First Line,  ER Negative, PD-L1 Expression Negative/Unknown Therapeutic Status: Distant Metastases HER2 Status: Negative (-) ER Status: Negative (-) PR Status: Negative (-) Therapy Approach Indicated: Standard Chemotherapy/Endocrine Therapy Line of Therapy: First Line PD-L1 Expression Status: Awaiting Test Results Intent of Therapy: Non-Curative / Palliative Intent, Discussed with Patient

## 2021-04-18 NOTE — Progress Notes (Signed)
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  #Early June 2022-Xeloda 1000 mg per metered square [2000] 2 weeks on 1 week of 6-8 cycles; July 11th 2022- STARTING cycle #3-cut down the dose to 1500 mg BID[sec to HFS].   # JAN 2023- A. SKIN, RIGHT BREAST; PUNCH BIOPSY: [AshleeByrnett] - INVASIVE CARCINOMA WITH DERMAL INFILTRATION AND LYMPHATIC INVASION. ER- weak POSISTIVE [10%]; PR/her2 NEG [Ashlee Cox]; JAN 20th, 2023- IMPRESSION: 1. Although the hypermetabolic right axillary node has resolved since the prior PET, there is a new hypermetabolic right low cervical/supraclavicular node, suspicious for nodal metastasis. 2. Decrease in right breast hypermetabolism with nonspecific overlying skin thickening in the setting of prior lumpectomy. 3. Low-level hypermetabolism within the T2 vertebral body, suspicious for isolated osseous metastasis. This could be confirmed with pre and postcontrast thoracic spine MRI. 4. Hypermetabolic thoracic nodes, slightly progressive. Given progression, nodal metastasis are slightly favored over reactive etiologies. 5. Incidental findings, including: Aortic Atherosclerosis (ICD10-I70.Cox). Tiny hiatal hernia. 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 No pathogenic variants identified on the [facility] Common Hereditary Cancers Panel + Skin Cancers Panel. The report date is 12/04/2019.  The Common Hereditary Cancers Panel + Skin Cancers Panel offered by [facility] includes sequencing and/or deletion duplication testing of the following 54 genes: APC*, ATM*, AXIN2, BAP1, BARD1, BMPR1A, BRCA1, BRCA2, BRIP1, CDH1, CDK4, CDKN2A (p14ARF), CDKN2A (p16INK4a), CHEK2, CTNNA1, DICER1*, EPCAM*, GREM1*, HOXB13, KIT, MEN1*, MITF*, MLH1*, MSH2*, MSH3*, MSH6*, MUTYH, NBN, NF1*, NTHL1, PALB2, PDGFRA, PMS2*, POLD1*, POLE, POT1, PTCH1, PTEN*, RAD50, RAD51C, RAD51D, RB1*, RNF43, SDHA*, SDHB, SDHC*, SDHD, SMAD4, SMARCA4, STK11, SUFU, TP53, TSC1*, TSC2, VHL.    05/07/2020 Cancer Staging   Staging form: Breast, AJCC 8th Edition - Clinical: Stage IIIB (cT2, cN1, cM0, G3, ER-, PR-, HER2-) - Signed by BCammie Sickle MD on 05/07/2020    04/18/2021 Cancer Staging   Staging form: Breast, AJCC 8th Edition - Pathologic: Stage IV (pT4d, pN3c, cM1, ER+, PR-, HER2-) - Signed by BCammie Sickle MD on 04/18/2021 Nuclear grade: G3       HISTORY OF PRESENTING ILLNESS: Accompanied by husband.  Walking independently.  CRozlynn LippoldBumgarner 71y.o.  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   In the interim patient was evaluated by Dr. BBary Cox a right breast "rash".  This was concerning for recurrent malignancy/inflammatory breast cancer.  Patient underwent  ultrasound/biopsy.  Patient complains of continued neck/left shoulder pain.  Difficulty turning the head.  Otherwise no shortness of breath or cough.  Chronic mild tingling and numbness in extremities.  Review of Systems  Constitutional:  Positive for malaise/fatigue. Negative for chills, diaphoresis, fever and weight loss.  HENT:  Negative for nosebleeds and sore throat.   Eyes:  Negative for double vision.  Respiratory:  Negative for cough, hemoptysis, sputum production and wheezing.   Cardiovascular:  Negative for chest pain, palpitations, orthopnea and leg swelling.  Gastrointestinal:  Negative for abdominal pain, blood in stool, constipation and melena.  Genitourinary:  Negative for dysuria, frequency and urgency.  Musculoskeletal:  Positive for back pain and neck pain. Negative for joint pain.  Neurological:  Positive for tingling. Negative for dizziness, focal weakness, weakness and headaches.  Endo/Heme/Allergies:  Does not bruise/bleed easily.  Psychiatric/Behavioral:  Negative for depression. The patient is not nervous/anxious and does not have insomnia.     MEDICAL HISTORY:  Past Medical History:  Diagnosis Date   Cancer [facility]) 2021   right breast   Cyst, breast    benign   Diabetes mellitus without complication ([place])    Family history of melanoma    Family history of ovarian cancer    GERD (gastroesophageal reflux disease)    H/O: rheumatic fever    History of colonoscopy 2010   done bc of bleeding,  normal , due back in 5 yrs (Ashlee Cox)   Hyperlipidemia    Hypertension    Menopause    at age 44   Personal history of chemotherapy 09/2019   RIGHT  INVASIVE MAMMARY CARCINOMA   Personal history of radiation therapy     SURGICAL HISTORY: Past Surgical History:  Procedure Laterality Date   APPENDECTOMY  2006   BREAST BIOPSY Right 09/22/2019   INVASIVE MAMMARY CARCINOMA   BREAST CYST ASPIRATION Left 1999   BREAST LUMPECTOMY Left 04/23/2020   surgery with NL and  SN    BREAST LUMPECTOMY WITH NEEDLE LOCALIZATION Right 04/23/2020   Procedure: BREAST LUMPECTOMY WITH NEEDLE LOCALIZATION;  Surgeon: Ashlee Bellow, MD;  Location: [facility] ORS;  Service: General;  Laterality: Right;   BREAST LUMPECTOMY WITH SENTINEL LYMPH NODE BIOPSY Right 04/23/2020   Procedure: BREAST LUMPECTOMY WITH SENTINEL LYMPH NODE BX;  Surgeon: Ashlee Bellow, MD;  Location: [facility] ORS;  Service: General;  Laterality: Right;   CHOLECYSTECTOMY  2006   COLONOSCOPY     PORTACATH PLACEMENT Left 10/06/2019   Procedure: INSERTION PORT-A-CATH;  Surgeon: Ashlee Bellow, MD;  Location: [facility] ORS;  Service: General;  Laterality: Left;   SUBMUCOSAL TATTOO INJECTION Right 10/06/2019   Procedure: Right axillary TATTOO INJECTION;  Surgeon: Ashlee Bellow, MD;  Location: [facility] ORS;  Service: General;  Laterality: Right;   VAGINAL DELIVERY     x3    SOCIAL HISTORY: Social History   Socioeconomic History   Marital status: Married    Spouse name: Chrissie Noa   Number of children: 3   Years of education: Not on file   Highest education level: Not on file  Occupational History   Occupation: Glass blower/designer: [id number]  Tobacco Use   Smoking status: Former    Years: 1.00    Types: Cigarettes    Quit date: 12/26/2005    Years since quitting: 15.3   Smokeless tobacco: Never   Tobacco comments:    smoked for less than 1 years,  1 cig/day  Vaping Use   Vaping Use: Never used  Substance and Sexual Activity   Alcohol use:  No   Drug use: No   Sexual activity: Never    Partners: Female  Other Topics Concern   Not on file  Social History Narrative   Widowed, March 2014; remarried.      [employer] ret[employer]; quit smoking 1ppw; no alcohol.       Social Determinants of Health   Financial Resource Strain: Not on file  Food Insecurity: Not on file  Transportation Needs: Not on file  Physical Activity: Not on file  Stress: Not on file  Social Connections: Not on file  Intimate Partner  Violence: Not on file    FAMILY HISTORY: Family History  Problem Relation Age of Onset   Cancer Mother 62       ovarian- died 4-5 years.    Heart disease Mother 55   Diabetes Mother    Stroke Father 30   Diabetes Father    Diabetes Sister    Melanoma Sister        survived   Breast cancer Neg Hx     ALLERGIES:  has No Known Allergies.  MEDICATIONS:  Current Outpatient Medications  Medication Sig Dispense Refill   acetaminophen (TYLENOL) 500 MG tablet Take 500 mg by mouth every 8 (eight) hours as needed for moderate pain.     gabapentin (NEURONTIN) 100 MG capsule TAKE 2 CAPSULES BY MOUTH AT BEDTIME 60 capsule Cox   glipiZIDE (GLUCOTROL) 5 MG tablet Take 1 tablet (5 mg total) by mouth 2 (two) times daily before a meal. 60 tablet 1   losartan-hydrochlorothiazide (HYZAAR) 50-12.5 MG tablet Take 1 tablet by mouth once daily 90 tablet Cox   lovastatin (MEVACOR) 40 MG tablet TAKE 1 TABLET BY MOUTH AT BEDTIME 90 tablet Cox   metFORMIN (GLUCOPHAGE) 1000 MG tablet TAKE 2 TABLETS BY MOUTH ONCE DAILY WITH  BREAKFAST 180 tablet Cox   Multiple Vitamin (MULTIVITAMIN) tablet Take 1 tablet by mouth daily.     omeprazole (PRILOSEC) 20 MG capsule Take 1 capsule by mouth once daily 90 capsule Cox   Biotin 1000 MCG tablet Take 1,000 mcg by mouth daily. (Patient not taking: Reported on 02/07/2021)     capecitabine (XELODA) 500 MG tablet Take 4 pills in AM: and 4 pills in PM- 2 weeks-ON and 1 week OFF (Patient not taking: Reported on 01/07/2021) 112 tablet 6   No current facility-administered medications for this visit.      Marland Kitchen  PHYSICAL EXAMINATION: ECOG PERFORMANCE STATUS: Cox - Asymptomatic  Vitals:   04/18/21 0850  BP: (!) 161/84  Pulse: 89  Temp: (!) 97.3 F (36.3 C)   Filed Weights   04/18/21 0850  Weight: 191 lb 9.6 oz (86.9 kg)    Physical Exam Constitutional:      Comments: Ambulating independently.  Alone.  HENT:     Head: Normocephalic and atraumatic.     Mouth/Throat:      Pharynx: No oropharyngeal exudate.  Eyes:     Pupils: Pupils are equal, round, and reactive to light.  Cardiovascular:     Rate and Rhythm: Normal rate and regular rhythm.  Pulmonary:     Effort: Pulmonary effort is normal. No respiratory distress.     Breath sounds: Normal breath sounds. No wheezing.  Abdominal:     General: Bowel sounds are normal. There is no distension.     Palpations: Abdomen is soft. There is no mass.     Tenderness: There is no abdominal tenderness. There is no guarding or rebound.  Musculoskeletal:  General: No tenderness. Normal range of motion.     Cervical back: Normal range of motion and neck supple.  Skin:    General: Skin is warm.  Neurological:     Mental Status: She is alert and oriented to person, place, and time.  Psychiatric:        Mood and Affect: Affect normal.     LABORATORY DATA:  I have reviewed the data as listed Lab Results  Component Value Date   WBC 5.4 04/18/2021   HGB 11.7 (L) 04/18/2021   HCT 36.4 04/18/2021   MCV 83.1 04/18/2021   PLT 156 04/18/2021   Recent Labs    01/07/21 1452 02/07/21 0928 04/18/21 0837  NA 135 135 135  K 3.5 3.9 3.9  CL 101 99 100  CO2 24 28 22   GLUCOSE 299* 213* 207*  BUN 13 13 17   CREATININE Cox.64 Cox.65 Cox.65  CALCIUM 9.2 9.3 10.Cox  GFRNONAA >60 >60 >60  PROT 7.6 7.9 8.1  ALBUMIN 4.Cox 3.9 4.1  AST 128* 59* 68*  ALT 66* 47* 44  ALKPHOS 125 99 116  BILITOT 1.Cox Cox.5 Cox.6    RADIOGRAPHIC STUDIES: I have personally reviewed the radiological images as listed and agreed with the findings in the report. MR THORACIC SPINE W WO CONTRAST  Result Date: 04/18/2021 CLINICAL DATA:  T2 lesion on PET, breast cancer EXAM: MRI THORACIC WITHOUT AND WITH CONTRAST TECHNIQUE: Multiplanar and multiecho pulse sequences of the thoracic spine were obtained without and with intravenous contrast. CONTRAST:  7.19m GADAVIST GADOBUTROL 1 MMOL/ML IV SOLN COMPARISON:  None. FINDINGS: Alignment:  Preserved. Vertebrae:  There is abnormal T1 marrow signal at T2 with STIR hyperintensity and enhancement. This is mildly eccentric to the left without involvement of the posterior elements. There is no associated compression fracture. No associated epidural disease. Marrow signal is otherwise within normal limits. Vertebral body heights are maintained Cord:  No abnormal signal.  No abnormal intrathecal enhancement. Paraspinal and other soft tissues: Unremarkable. Disc levels: Intervertebral disc heights and signal are maintained. No significant degenerative stenosis. IMPRESSION: Abnormal signal at T2 is most consistent with metastatic disease. No associated compression fracture or extraosseous extension. Electronically Signed   By: PMacy MisM.D.   On: 04/18/2021 15:06   NM PET Image Restage (PS) Skull Base to Thigh (F-18 FDG)  Result Date: 04/15/2021 CLINICAL DATA:  Subsequent treatment strategy for right-sided breast cancer. Pre systemic therapy. EXAM: NUCLEAR MEDICINE PET SKULL BASE TO THIGH TECHNIQUE: 9.7 mCi F-18 FDG was injected intravenously. Full-ring PET imaging was performed from the skull base to thigh after the radiotracer. CT data was obtained and used for attenuation correction and anatomic localization. Fasting blood glucose: 204 mg/dl COMPARISON:  02/02/2021 chest abdomen and pelvic CTs. Most recent PET 10/03/2019. FINDINGS: Mediastinal blood pool activity: SUV max 3.1 Liver activity: SUV max NA NECK: A right posterior triangle/supraclavicular node is new since the prior PET, measuring 1.Cox cm and a S.U.V. max of 2.3 on 48/3. Incidental CT findings: No other enlarged cervical nodes. CHEST: The previously described right axillary hypermetabolic node has resolved. Hypermetabolic mediastinal nodes are again identified. A left mediastinal/AP window node measures 1.3 cm and a S.U.V. max of 4.8 on 82/3. Compare 8 mm and a S.U.V. max of 3.9 on the prior exam. Right paratracheal node measures 11 mm and a S.U.V. max of 3.7  on 81/3 versus 7 mm and a S.U.V. max of 2.8 on the prior exam (when remeasured). New right breast skin thickening.  Residual primarily central and lateral right breast hypermetabolism without well-defined residual mass. Example at a S.U.V. max of 4.Cox. On the prior PET, the primary measured a S.U.V. max of 7.Cox. Incidental CT findings: Left Port-A-Cath tip high right atrium. Mild cardiomegaly. Aortic atherosclerosis. Tiny hiatal hernia. ABDOMEN/PELVIS: No abdominopelvic parenchymal or nodal hypermetabolism. Incidental CT findings: Moderate hepatic steatosis with nonspecific caudate lobe enlargement. Cholecystectomy. Normal noncontrast appearance of the pancreas, adrenal glands, kidneys. Abdominal aortic atherosclerosis. SKELETON: Mild hypermetabolism at the T2 level is new at a S.U.V. max of 3.4. No compression deformity or dominant lesion in this area on nondedicated CT. Incidental CT findings: Bilateral L5 pars defects with grade 1 L5-S1 anterolisthesis. Trace L4-5 anterolisthesis. IMPRESSION: 1. Although the hypermetabolic right axillary node has resolved since the prior PET, there is a new hypermetabolic right low cervical/supraclavicular node, suspicious for nodal metastasis. 2. Decrease in right breast hypermetabolism with nonspecific overlying skin thickening in the setting of prior lumpectomy. 3. Low-level hypermetabolism within the T2 vertebral body, suspicious for isolated osseous metastasis. This could be confirmed with pre and postcontrast thoracic spine MRI. 4. Hypermetabolic thoracic nodes, slightly progressive. Given progression, nodal metastasis are slightly favored over reactive etiologies. 5. Incidental findings, including: Aortic Atherosclerosis (ICD10-I70.Cox). Tiny hiatal hernia. Hepatic steatosis with nonspecific caudate lobe enlargement. Electronically Signed   By: Abigail Miyamoto M.D.   On: 04/15/2021 13:41    ASSESSMENT & PLAN:   Carcinoma of upper-inner quadrant of right breast in female,  estrogen receptor negative ([place]) #Recurrent right breast cancer-s/p breast skin biopsy-week ER positive /PR HER2 negative- recurrent-highly suspicious for stage IV;Apr 15, 2021- PET scan shows hypermetabolic thoracic lymph nodes; T2 vertebral body concerning for metastatic disease; lower level hypermetabolic some cervical/supraclavicular lymph node/right breast hypermetabolic lymph at the level of previous surgery.  #A long discussion with the patient regarding/stage- IV /pathology-recurrent malignancy.  Discussed that unfortunately her disease cannot be cured.  And she will likely need indefinite therapies/systemic therapies.  Recommend starting Taxol weekly.  Need to monitor neuropathy closely. Ordered PDL-1; if positive for CPS >20-recommend addition of Keytruda with chemotherapy.  Discussed with Dr. Bary Castilla; hold off any surgical option at this time.  #T2 uptake concerning for osseous metastatic disease; recommend MRI for further evaluation. / consider zometa.   # PN-1-2 sec to taxol-on Neurontin 200 qhs-stable  # Mild LFT abomirnality-suspect fatty liver versus chemotherapy-ultrasound suggestive of fatty liver-monitor for now  # DM/poorly controlled- continue metformin 2000 mg q AM [AshleeTullo]; PP- 213 on glipizide 5 mg BID. Continue follow up with  PCP  #Incidental findings on PET Imaging dated: JAN, 2023:5. Incidental findings, including: Aortic Atherosclerosis (ICD10-I70.Cox). Tiny hiatal hernia. Hepatic steatosis with nonspecific caudate lobe enlargement. I reviewed/discussed/counseled the patient.   # Mediport: Functioning.    # DISPOSITION: MRI spine- STAT # follow up 2 weeks-- MD; labs- cbc/cmp/ Taxol chemo [new];-AshleeB  # I reviewed the blood work- with the patient in detail; also reviewed the imaging independently [as summarized above]; and with the patient in detail.     All questions were answered. The patient/family knows to call the clinic with any problems, questions or  concerns.   Ashlee Sickle, MD 04/19/2021 8:19 AM

## 2021-04-19 ENCOUNTER — Encounter: Payer: Self-pay | Admitting: Internal Medicine

## 2021-04-19 LAB — CANCER ANTIGEN 27.29: CA 27.29: 23.1 U/mL (ref 0.0–38.6)

## 2021-04-19 LAB — CANCER ANTIGEN 15-3: CA 15-3: 17.1 U/mL (ref 0.0–25.0)

## 2021-04-20 ENCOUNTER — Encounter: Payer: Self-pay | Admitting: Internal Medicine

## 2021-04-20 ENCOUNTER — Ambulatory Visit (INDEPENDENT_AMBULATORY_CARE_PROVIDER_SITE_OTHER): Payer: PPO | Admitting: Internal Medicine

## 2021-04-20 ENCOUNTER — Other Ambulatory Visit: Payer: Self-pay

## 2021-04-20 VITALS — BP 158/78 | HR 94 | Temp 97.7°F | Ht 67.0 in | Wt 189.8 lb

## 2021-04-20 DIAGNOSIS — E113559 Type 2 diabetes mellitus with stable proliferative diabetic retinopathy, unspecified eye: Secondary | ICD-10-CM

## 2021-04-20 DIAGNOSIS — I10 Essential (primary) hypertension: Secondary | ICD-10-CM | POA: Diagnosis not present

## 2021-04-20 DIAGNOSIS — G620 Drug-induced polyneuropathy: Secondary | ICD-10-CM

## 2021-04-20 DIAGNOSIS — C50211 Malignant neoplasm of upper-inner quadrant of right female breast: Secondary | ICD-10-CM | POA: Diagnosis not present

## 2021-04-20 DIAGNOSIS — Z171 Estrogen receptor negative status [ER-]: Secondary | ICD-10-CM | POA: Diagnosis not present

## 2021-04-20 LAB — POCT GLYCOSYLATED HEMOGLOBIN (HGB A1C): Hemoglobin A1C: 9.2 % — AB (ref 4.0–5.6)

## 2021-04-20 MED ORDER — GLIPIZIDE ER 5 MG PO TB24: 5.0000 mg | ORAL_TABLET | Freq: Every day | ORAL | 1 refills | Status: DC

## 2021-04-20 NOTE — Assessment & Plan Note (Signed)
Well controlled on current regimen. Renal function stable, no changes today. 

## 2021-04-20 NOTE — Progress Notes (Signed)
Subjective:  Patient ID: Ashlee Cox, female    DOB: 08-Apr-1950  Age: 71 y.o. MRN: [id number]  CC: The primary encounter diagnosis was Primary hypertension. Diagnoses of Type 2 diabetes mellitus with stable proliferative retinopathy, without long-term current use of insulin, unspecified laterality ([place]), Drug-induced polyneuropathy ([place]), and Carcinoma of upper-inner quadrant of right breast in female, estrogen receptor negative ([place]) were also pertinent to this visit.   This visit occurred during the SARS-CoV-2 public health emergency.  Safety protocols were in place, including screening questions prior to the visit, additional usage of staff PPE, and extensive cleaning of exam room while observing appropriate contact time as indicated for disinfecting solutions.    HPI Ashlee Cox presents for follow up on multiple issues.  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  She feels generally well except for mild pain involving the left side of her neck.  .  She is in good spirits and is actively engaged in fighting her cancer.  Her husband is with her today but appears quite distressed   2) Type 2 DM:  she has been forgetting to take her evening dose of glipizide often, and notes that her blood sugars have been elevated .  3) Hypertension: patient checks blood pressure twice weekly at home.  Readings have been for the most part < 140/80 at rest . Patient is following a reduce salt diet most days and is taking medications as prescribed      Outpatient Medications Prior to Visit  Medication Sig Dispense Refill   acetaminophen (TYLENOL) 500 MG tablet Take 500 mg by mouth every 8 (eight) hours as needed for moderate pain.     Biotin 1000 MCG tablet Take  1,000 mcg by mouth daily.     gabapentin (NEURONTIN) 100 MG capsule TAKE 2 CAPSULES BY MOUTH AT BEDTIME 60 capsule 0   losartan-hydrochlorothiazide (HYZAAR) 50-12.5 MG tablet Take 1 tablet by mouth once daily 90 tablet 0   lovastatin (MEVACOR) 40 MG tablet TAKE 1 TABLET BY MOUTH AT BEDTIME 90 tablet 0   metFORMIN (GLUCOPHAGE) 1000 MG tablet TAKE 2 TABLETS BY MOUTH ONCE DAILY WITH  BREAKFAST 180 tablet 0   Multiple Vitamin (MULTIVITAMIN) tablet Take 1 tablet by mouth daily.     omeprazole (PRILOSEC) 20 MG capsule Take 1 capsule by mouth once daily 90 capsule 0   glipiZIDE (GLUCOTROL) 5 MG tablet Take 1 tablet (5 mg total) by mouth 2 (two) times daily before a meal. 60 tablet 1   capecitabine (XELODA) 500 MG tablet Take 4 pills in AM: and 4 pills in PM- 2 weeks-ON and 1 week OFF (Patient not taking: Reported on 01/07/2021) 112 tablet 6   No facility-administered medications prior to visit.    Review of Systems;  Patient denies headache, fevers, malaise, unintentional weight loss, skin rash, eye pain, sinus congestion and sinus pain, sore throat, dysphagia,  hemoptysis , cough, dyspnea, wheezing, chest pain, palpitations, orthopnea, edema, abdominal pain, nausea, melena, diarrhea, constipation, flank pain, dysuria, hematuria, urinary  Frequency, nocturia, numbness, tingling, seizures,  Focal weakness, Loss of consciousness,  Tremor, insomnia, depression, anxiety, and suicidal ideation.      Objective:  BP (!) 158/78 (BP Location: Left Arm,  Patient Position: Sitting, Cuff Size: Large)    Pulse 94    Temp 97.7 F (36.5 C) (Oral)    Ht 5\' 7"  (1.702 m)    Wt 189 lb 12.8 oz (86.1 kg)    SpO2 98%    BMI 29.73 kg/m   BP Readings from Last 3 Encounters:  04/20/21 (!) 158/78  04/18/21 (!) 161/84  02/07/21 133/76    Wt Readings from Last 3 Encounters:  04/20/21 189 lb 12.8 oz (86.1 kg)  04/18/21 191 lb 9.6 oz (86.9 kg)  02/07/21 190 lb 6.4 oz (86.4 kg)    General appearance: alert, cooperative  and appears stated age Ears: normal TM's and external ear canals both ears Throat: lips, mucosa, and tongue normal; teeth and gums normal Neck: no adenopathy, no carotid bruit, supple, symmetrical, trachea midline and thyroid not enlarged, symmetric, no tenderness/mass/nodules Back: symmetric, no curvature. ROM normal. No CVA tenderness. Lungs: clear to auscultation bilaterally Heart: regular rate and rhythm, S1, S2 normal, no murmur, click, rub or gallop Abdomen: soft, non-tender; bowel sounds normal; no masses,  no organomegaly Pulses: 2+ and symmetric Skin: Skin color, texture, turgor normal. No rashes or lesions Lymph nodes: Cervical, supraclavicular, and axillary nodes normal.  Lab Results  Component Value Date   HGBA1C 9.2 (A) 04/20/2021   HGBA1C 6.8 (A) 10/13/2020   HGBA1C 6.9 (H) 08/26/2019    Lab Results  Component Value Date   CREATININE 0.65 04/18/2021   CREATININE 0.65 02/07/2021   CREATININE 0.64 01/07/2021    Lab Results  Component Value Date   WBC 5.4 04/18/2021   HGB 11.7 (L) 04/18/2021   HCT 36.4 04/18/2021   PLT 156 04/18/2021   GLUCOSE 207 (H) 04/18/2021   CHOL 173 04/24/2019   TRIG 166.0 (H) 04/24/2019   HDL 51.10 04/24/2019   LDLDIRECT 82.0 06/13/2018   LDLCALC 88 04/24/2019   ALT 44 04/18/2021   AST 68 (H) 04/18/2021   NA 135 04/18/2021   K 3.9 04/18/2021   CL 100 04/18/2021   CREATININE 0.65 04/18/2021   BUN 17 04/18/2021   CO2 22 04/18/2021   TSH 1.92 04/24/2019   HGBA1C 9.2 (A) 04/20/2021   MICROALBUR 0.9 10/13/2020    MR THORACIC SPINE W WO CONTRAST  Result Date: 04/18/2021 CLINICAL DATA:  T2 lesion on PET, breast cancer EXAM: MRI THORACIC WITHOUT AND WITH CONTRAST TECHNIQUE: Multiplanar and multiecho pulse sequences of the thoracic spine were obtained without and with intravenous contrast. CONTRAST:  7.85mL GADAVIST GADOBUTROL 1 MMOL/ML IV SOLN COMPARISON:  None. FINDINGS: Alignment:  Preserved. Vertebrae: There is abnormal T1 marrow  signal at T2 with STIR hyperintensity and enhancement. This is mildly eccentric to the left without involvement of the posterior elements. There is no associated compression fracture. No associated epidural disease. Marrow signal is otherwise within normal limits. Vertebral body heights are maintained Cord:  No abnormal signal.  No abnormal intrathecal enhancement. Paraspinal and other soft tissues: Unremarkable. Disc levels: Intervertebral disc heights and signal are maintained. No significant degenerative stenosis. IMPRESSION: Abnormal signal at T2 is most consistent with metastatic disease. No associated compression fracture or extraosseous extension. Electronically Signed   By: Macy Mis M.D.   On: 04/18/2021 15:06    Assessment & Plan:   Problem List Items Addressed This Visit     Hypertension - Primary    Well controlled on current regimen. Renal function stable, no changes today.      Relevant Orders   AMB Referral to [facility]  Coordinaton   Carcinoma of upper-inner quadrant of right breast in female, estrogen receptor negative ([place])    Recent evidence of metastatic disease to thoracic spine noted on mRI .  She is asymptomatic except for mild neck pain.  She remains resolute to continue treatment       Relevant Orders   [facility]B Referral to [facility]   Drug-induced polyneuropathy ([place])   Type 2 diabetes mellitus with stable proliferative retinopathy, without long-term current use of insulin ([facility])    Loss of control due to medication nonadherence.  Changing glipizide 5 mg bid to glucotrol xl .  Will reassess blood sugars in one month      Relevant Medications   glipiZIDE (GLUCOTROL XL) 5 MG 24 hr tablet   Other Relevant Orders   POCT HgB A1C (Completed)   AMB Referral to [facility]    I spent 30 minutes dedicated to the care of this patient on the date of this encounter to include pre-visit review of patient's medical history,  most recent  imaging studies, Face-to-face time with the patient , and post visit ordering of testing and therapeutics.    Follow-up: No follow-ups on file.   [name], MD

## 2021-04-20 NOTE — Patient Instructions (Addendum)
°  The new goals for optimal blood pressure management are 130/80.    Please check your blood pressure a few times at home and send me the readings so I can determine if you need a change in medication    Changing glipizide  to once daily with breakfast , lunch or dinner  Continue metformin as you are doing   Ok to try "ZZZ  but if side effects bother you,  call for alprazolam prescription  A1c is HIGH!  IT MAY BE CORRECTED WITH THE ONCE DAILY GLIPIZIDE, BUT:    Check your blood sugars  either Fasting or 2 hr post prandial (after a meal)  every day and send me the readings so I can adjust the dose of glipizide if it needs to be increased

## 2021-04-20 NOTE — Assessment & Plan Note (Addendum)
Loss of control due to medication nonadherence.  Changing glipizide 5 mg bid to glucotrol xl .  Will reassess blood sugars in one month

## 2021-04-20 NOTE — Assessment & Plan Note (Signed)
Recent evidence of metastatic disease to thoracic spine noted on mRI .  She is asymptomatic except for mild neck pain.  She remains resolute to continue treatment

## 2021-04-20 NOTE — Progress Notes (Signed)
I spoke to patient regarding the results of the MRI concerning for metastatic disease to T2 vertebral body. Patients pain control with NSAIDs prn.  Will plan to hold off radiation this time. Follow as planned.  GB

## 2021-04-21 ENCOUNTER — Telehealth: Payer: Self-pay

## 2021-04-21 NOTE — Chronic Care Management (AMB) (Signed)
Chronic Care Management   Note  04/21/2021 Name: Ashlee Cox MRN: [id number] DOB: 08-30-1950  Ashlee Cox is a 71 y.o. year old female who is a primary care patient of Derrel Nip, Aris Everts, MD. I reached out to [employer] by phone today in response to a referral sent by Ashlee Cox PCP.  Ashlee Cox was given information about Chronic Care Management services today including:  CCM service includes personalized support from designated clinical staff supervised by her physician, including individualized plan of care and coordination with other care providers 24/7 contact phone numbers for assistance for urgent and routine care needs. Service will only be billed when office clinical staff spend 20 minutes or more in a month to coordinate care. Only one practitioner may furnish and bill the service in a calendar month. The patient may stop CCM services at any time (effective at the end of the month) by phone call to the office staff. The patient is responsible for co-pay (up to 20% after annual deductible is met) if co-pay is required by the individual health plan.   Patient agreed to services and verbal consent obtained.   Follow up plan: Telephone appointment with care management team member scheduled for: Pharm D 04/27/2021 RNCM 05/04/2021 LCSW 05/18/2021  Ashlee Cox, [place], [place], [place] [address] Direct Dial: [phone] [email or web address]

## 2021-04-27 ENCOUNTER — Ambulatory Visit (INDEPENDENT_AMBULATORY_CARE_PROVIDER_SITE_OTHER): Payer: PPO | Admitting: Pharmacist

## 2021-04-27 DIAGNOSIS — I10 Essential (primary) hypertension: Secondary | ICD-10-CM

## 2021-04-27 DIAGNOSIS — E113559 Type 2 diabetes mellitus with stable proliferative diabetic retinopathy, unspecified eye: Secondary | ICD-10-CM

## 2021-04-27 DIAGNOSIS — G620 Drug-induced polyneuropathy: Secondary | ICD-10-CM

## 2021-04-27 DIAGNOSIS — E785 Hyperlipidemia, unspecified: Secondary | ICD-10-CM

## 2021-04-27 NOTE — Patient Instructions (Signed)
Mo[place],   It was great talking to you!  I think using the [facility] 3 would be great for you. Here is the website.   Give [place] a call if you develop any low blood sugars - anything less than 70 or having symptoms of low blood sugars like dizziness, lightheadedness, or sweating.   Take care!  Catie Darnelle Maffucci, PharmD  Visit Information   Following is a copy of your full care plan:  Care Plan : Medication Management  Updates made by De Hollingshead, RPH-CPP since 04/27/2021 12:00 AM     Problem: Diabetes, Breast Cancer, Hypertension, Hyperlipidemia      Long-Range Goal: Disease Progression Prevention   Start Date: 04/27/2021  This Visit's Progress: On track  Priority: High  Note:   Current Barriers:  Unable to achieve control of diabetes   Pharmacist Clinical Goal(s):  patient will achieve control of diabetes as evidenced by A1c  through collaboration with PharmD and provider.   Interventions: 1:1 collaboration with [name], MD regarding development and update of comprehensive plan of care as evidenced by provider attestation and co-signature Inter-disciplinary care team collaboration (see longitudinal plan of care) Comprehensive medication review performed; medication list updated in electronic medical record  Health Maintenance   Yearly diabetic eye exam: up to date Yearly diabetic foot exam: up to date Urine microalbumin: up to date Yearly influenza vaccination: due Td/Tdap vaccination: up to date Pneumonia vaccination: due COVID vaccinations: due Shingrix vaccinations: due Colonoscopy: up to date Bone density scan: up to date Mammogram: up to date  Diabetes:  Uncontrolled; current treatment: metformin 1000 mg twice daily, glipizide XL 5 mg QAM;  Current glucose readings: fasting glucose: 160-180s, post prandial glucose: 140-160s Denies hypoglycemic symptoms Reviewed coverage of CGM on patient's insurance. She is interested. Scheduled for CGM teaching in  ~2 weeks. Will defer medication changes until after she starts chemotherapy to better evaluate long term plans Counseled on risk of hypoglycemia with glipizide, especially if chemotherapy results in decreased appetite. Will follow.   Hypertension:   Controlled; current treatment:losartan/HCTZ 50/12.5 mg daily;  Recommended to continue current regimen at this time. Follow with starting chemotherapy  Hyperlipidemia:   Uncontrolled, though goal <70 may not be necessary given long term prognosis; current treatment: lovastatin 40 mg daily ;  Continue current regimen at this time  GERD: Controlled; current regimen: omeprazole 20 mg daily Recommended to continue current regimen at this time  Neuropathic Pain: Controlled; current regimen: gabapentin 200 mg daily Recommended to continue current regimen at this time  Patient Goals/Self-Care Activities patient will:  - check glucose twice daily, document, and provide at future appointments collaborate with provider on medication access solutions      Consent to CCM Services: Ms. Lofaso was given information about Chronic Care Management services including:  CCM service includes personalized support from designated clinical staff supervised by her physician, including individualized plan of care and coordination with other care providers 24/7 contact phone numbers for assistance for urgent and routine care needs. Service will only be billed when office clinical staff spend 20 minutes or more in a month to coordinate care. Only one practitioner may furnish and bill the service in a calendar month. The patient may stop CCM services at any time (effective at the end of the month) by phone call to the office staff. The patient will be responsible for cost sharing (co-pay) of up to 20% of the service fee (after annual deductible is met).  Patient agreed to  services and verbal consent obtained.   Plan: Telephone follow up appointment with care  management team member scheduled for:  2 weeks  Catie Darnelle Maffucci, PharmD, [place], CPP Clinical Pharmacist [place] at [facility] [phone]   Please call the care guide team at [phone] if you need to cancel or reschedule your appointment.   Patient verbalizes understanding of instructions and care plan provided today and agrees to view in [place]. Active MyChart status confirmed with patient.

## 2021-04-27 NOTE — Chronic Care Management (AMB) (Signed)
Chronic Care Management CCM Pharmacy Note  04/27/2021 Name:  Ashlee Cox MRN:  [id number] DOB:  [birth year]  Summary: - Starts chemotherapy Monday.  - Glucose improved with glipizide XL, denies hypoglycemia - Interested in CGM  Recommendations/Changes made from today's visit: - Scheduled visit in ~ 2 weeks for CGM teaching  Subjective: Ashlee Cox is an 71 y.o. year old female who is a primary patient of Tullo, Aris Everts, MD.  The CCM team was consulted for assistance with disease management and care coordination needs.    Engaged with patient by telephone for initial visit for pharmacy case management and/or care coordination services.   Objective:  Medications Reviewed Today     Reviewed by De Hollingshead, RPH-CPP (Pharmacist) on 04/27/21 at 1503  Med List Status: <None>   Medication Order Taking? Sig Documenting Provider Last Dose Status Informant  acetaminophen (TYLENOL) 500 MG tablet [id number] Yes Take 500 mg by mouth every 8 (eight) hours as needed for moderate pain. [provider] Taking Active   gabapentin (NEURONTIN) 100 MG capsule [id number] Yes TAKE 2 CAPSULES BY MOUTH AT BEDTIME Cammie Sickle, MD Taking Active   glipiZIDE (GLUCOTROL XL) 5 MG 24 hr tablet [id number] Yes Take 1 tablet (5 mg total) by mouth daily with breakfast. [name], MD Taking Active   losartan-hydrochlorothiazide [facility]) 50-12.5 MG tablet [id number] Yes Take 1 tablet by mouth once daily [name], MD Taking Active   lovastatin (MEVACOR) 40 MG tablet [id number] Yes TAKE 1 TABLET BY MOUTH AT BEDTIME [name], MD Taking Active   metFORMIN (GLUCOPHAGE) 1000 MG tablet [id number] Yes TAKE 2 TABLETS BY MOUTH ONCE DAILY WITH  BREAKFAST [name], MD Taking Active   Multiple Vitamin (MULTIVITAMIN) tablet [id number] Yes Take 1 tablet by mouth daily. [provider] Taking Active Self  omeprazole (PRILOSEC) 20 MG capsule [id number] Yes Take 1 capsule  by mouth once daily [name], MD Taking Active             Pertinent Labs:   Lab Results  Component Value Date   HGBA1C 9.2 (A) 04/20/2021   Lab Results  Component Value Date   CHOL 173 04/24/2019   HDL 51.10 04/24/2019   [place] 88 04/24/2019   LDLDIRECT 82.0 06/13/2018   TRIG 166.0 (H) 04/24/2019   CHOLHDL 3 04/24/2019   Lab Results  Component Value Date   CREATININE 0.65 04/18/2021   BUN 17 04/18/2021   NA 135 04/18/2021   K 3.9 04/18/2021   CL 100 04/18/2021   CO2 22 04/18/2021    SDOH:  (Social Determinants of Health) assessments and interventions performed:  SDOH Interventions    Flowsheet Row Most Recent Value  SDOH Interventions   Financial Strain Interventions Intervention Not Indicated       CCM Care Plan  Review of patient past medical history, allergies, medications, health status, including review of consultants reports, laboratory and other test data, was performed as part of comprehensive evaluation and provision of chronic care management services.   Care Plan : Medication Management  Updates made by De Hollingshead, RPH-CPP since 04/27/2021 12:00 AM     Problem: Diabetes, Breast Cancer, Hypertension, Hyperlipidemia      Long-Range Goal: Disease Progression Prevention   Start Date: 04/27/2021  This Visit's Progress: On track  Priority: High  Note:   Current Barriers:  Unable to achieve control of diabetes   Pharmacist Clinical Goal(s):  patient will achieve control of  diabetes as evidenced by A1c  through collaboration with PharmD and provider.   Interventions: 1:1 collaboration with [name], MD regarding development and update of comprehensive plan of care as evidenced by provider attestation and co-signature Inter-disciplinary care team collaboration (see longitudinal plan of care) Comprehensive medication review performed; medication list updated in electronic medical record  Health Maintenance   Yearly diabetic  eye exam: up to date Yearly diabetic foot exam: up to date Urine microalbumin: up to date Yearly influenza vaccination: due Td/Tdap vaccination: up to date Pneumonia vaccination: due COVID vaccinations: due Shingrix vaccinations: due Colonoscopy: up to date Bone density scan: up to date Mammogram: up to date  Diabetes:  Uncontrolled; current treatment: metformin 1000 mg twice daily, glipizide XL 5 mg QAM;  Current glucose readings: fasting glucose: 160-180s, post prandial glucose: 140-160s Denies hypoglycemic symptoms Reviewed coverage of CGM on patient's insurance. She is interested. Scheduled for CGM teaching in ~2 weeks. Will defer medication changes until after she starts chemotherapy to better evaluate long term plans Counseled on risk of hypoglycemia with glipizide, especially if chemotherapy results in decreased appetite. Will follow.   Hypertension:   Controlled; current treatment:losartan/HCTZ 50/12.5 mg daily;  Recommended to continue current regimen at this time. Follow with starting chemotherapy  Hyperlipidemia:   Uncontrolled, though goal <70 may not be necessary given long term prognosis; current treatment: lovastatin 40 mg daily ;  Continue current regimen at this time  GERD: Controlled; current regimen: omeprazole 20 mg daily Recommended to continue current regimen at this time  Neuropathic Pain: Controlled; current regimen: gabapentin 200 mg daily Recommended to continue current regimen at this time  Patient Goals/Self-Care Activities patient will:  - check glucose twice daily, document, and provide at future appointments collaborate with provider on medication access solutions       Plan: Face to Face appointment with care management team member scheduled for: 2 weeks  Catie Darnelle Maffucci, PharmD, [place], [place] Pharmacist [employer] at [employer] [phone]

## 2021-05-02 ENCOUNTER — Other Ambulatory Visit: Payer: Self-pay

## 2021-05-02 ENCOUNTER — Inpatient Hospital Stay: Payer: PPO | Admitting: Internal Medicine

## 2021-05-02 ENCOUNTER — Inpatient Hospital Stay: Payer: PPO | Attending: Internal Medicine

## 2021-05-02 ENCOUNTER — Inpatient Hospital Stay: Payer: PPO

## 2021-05-02 VITALS — BP 140/76 | HR 96 | Temp 98.6°F | Resp 16

## 2021-05-02 DIAGNOSIS — C50211 Malignant neoplasm of upper-inner quadrant of right female breast: Secondary | ICD-10-CM

## 2021-05-02 DIAGNOSIS — Z171 Estrogen receptor negative status [ER-]: Secondary | ICD-10-CM | POA: Insufficient documentation

## 2021-05-02 DIAGNOSIS — E1165 Type 2 diabetes mellitus with hyperglycemia: Secondary | ICD-10-CM | POA: Diagnosis not present

## 2021-05-02 DIAGNOSIS — R7989 Other specified abnormal findings of blood chemistry: Secondary | ICD-10-CM | POA: Diagnosis not present

## 2021-05-02 DIAGNOSIS — Z5112 Encounter for antineoplastic immunotherapy: Secondary | ICD-10-CM | POA: Insufficient documentation

## 2021-05-02 DIAGNOSIS — Z79899 Other long term (current) drug therapy: Secondary | ICD-10-CM | POA: Diagnosis not present

## 2021-05-02 DIAGNOSIS — Z87891 Personal history of nicotine dependence: Secondary | ICD-10-CM | POA: Diagnosis not present

## 2021-05-02 DIAGNOSIS — C7951 Secondary malignant neoplasm of bone: Secondary | ICD-10-CM | POA: Insufficient documentation

## 2021-05-02 DIAGNOSIS — Z7984 Long term (current) use of oral hypoglycemic drugs: Secondary | ICD-10-CM | POA: Insufficient documentation

## 2021-05-02 DIAGNOSIS — D4861 Neoplasm of uncertain behavior of right breast: Secondary | ICD-10-CM

## 2021-05-02 DIAGNOSIS — Z5111 Encounter for antineoplastic chemotherapy: Secondary | ICD-10-CM | POA: Insufficient documentation

## 2021-05-02 DIAGNOSIS — E876 Hypokalemia: Secondary | ICD-10-CM | POA: Insufficient documentation

## 2021-05-02 LAB — CBC WITH DIFFERENTIAL/PLATELET
Abs Immature Granulocytes: 0.01 10*3/uL (ref 0.00–0.07)
Basophils Absolute: 0 10*3/uL (ref 0.0–0.1)
Basophils Relative: 1 %
Eosinophils Absolute: 0.1 10*3/uL (ref 0.0–0.5)
Eosinophils Relative: 3 %
HCT: 37.1 % (ref 36.0–46.0)
Hemoglobin: 11.8 g/dL — ABNORMAL LOW (ref 12.0–15.0)
Immature Granulocytes: 0 %
Lymphocytes Relative: 27 %
Lymphs Abs: 1.2 10*3/uL (ref 0.7–4.0)
MCH: 26.6 pg (ref 26.0–34.0)
MCHC: 31.8 g/dL (ref 30.0–36.0)
MCV: 83.6 fL (ref 80.0–100.0)
Monocytes Absolute: 0.4 10*3/uL (ref 0.1–1.0)
Monocytes Relative: 8 %
Neutro Abs: 2.7 10*3/uL (ref 1.7–7.7)
Neutrophils Relative %: 61 %
Platelets: 174 10*3/uL (ref 150–400)
RBC: 4.44 MIL/uL (ref 3.87–5.11)
RDW: 15.1 % (ref 11.5–15.5)
WBC: 4.4 10*3/uL (ref 4.0–10.5)
nRBC: 0 % (ref 0.0–0.2)

## 2021-05-02 LAB — COMPREHENSIVE METABOLIC PANEL
ALT: 44 U/L (ref 0–44)
AST: 63 U/L — ABNORMAL HIGH (ref 15–41)
Albumin: 4.1 g/dL (ref 3.5–5.0)
Alkaline Phosphatase: 99 U/L (ref 38–126)
Anion gap: 8 (ref 5–15)
BUN: 13 mg/dL (ref 8–23)
CO2: 24 mmol/L (ref 22–32)
Calcium: 9.3 mg/dL (ref 8.9–10.3)
Chloride: 104 mmol/L (ref 98–111)
Creatinine, Ser: 0.8 mg/dL (ref 0.44–1.00)
GFR, Estimated: >60 mL/min (ref >60)
Glucose, Bld: 176 mg/dL — ABNORMAL HIGH (ref 70–99)
Potassium: 3.7 mmol/L (ref 3.5–5.1)
Sodium: 136 mmol/L (ref 135–145)
Total Bilirubin: 0.6 mg/dL (ref 0.3–1.2)
Total Protein: 8.1 g/dL (ref 6.5–8.1)

## 2021-05-02 MED ORDER — SODIUM CHLORIDE 0.9 % IV SOLN
Freq: Once | INTRAVENOUS | Status: DC | PRN
  Filled 2021-05-02: qty 250

## 2021-05-02 MED ORDER — GLIPIZIDE ER 10 MG PO TB24: 10.0000 mg | ORAL_TABLET | Freq: Every day | ORAL | 1 refills | Status: DC

## 2021-05-02 MED ORDER — SODIUM CHLORIDE 0.9 % IV SOLN
10.0000 mg | Freq: Once | INTRAVENOUS | Status: AC
  Administered 2021-05-02: 10 mg via INTRAVENOUS
  Filled 2021-05-02: qty 10

## 2021-05-02 MED ORDER — HEPARIN SOD (PORK) LOCK FLUSH 100 UNIT/ML IV SOLN
INTRAVENOUS | Status: AC
  Administered 2021-05-02: 500 [IU]
  Filled 2021-05-02: qty 5

## 2021-05-02 MED ORDER — DIPHENHYDRAMINE HCL 50 MG/ML IJ SOLN
50.0000 mg | Freq: Once | INTRAMUSCULAR | Status: AC
  Administered 2021-05-02: 50 mg via INTRAVENOUS
  Filled 2021-05-02: qty 1

## 2021-05-02 MED ORDER — DIPHENHYDRAMINE HCL 50 MG/ML IJ SOLN
50.0000 mg | Freq: Once | INTRAMUSCULAR | Status: AC | PRN
  Administered 2021-05-02: 50 mg via INTRAVENOUS

## 2021-05-02 MED ORDER — FAMOTIDINE IN NACL 20-0.9 MG/50ML-% IV SOLN
20.0000 mg | Freq: Once | INTRAVENOUS | Status: AC
  Administered 2021-05-02: 20 mg via INTRAVENOUS
  Filled 2021-05-02: qty 50

## 2021-05-02 MED ORDER — HEPARIN SOD (PORK) LOCK FLUSH 100 UNIT/ML IV SOLN
500.0000 [IU] | Freq: Once | INTRAVENOUS | Status: AC | PRN
  Filled 2021-05-02: qty 5

## 2021-05-02 MED ORDER — METHYLPREDNISOLONE SODIUM SUCC 125 MG IJ SOLR
125.0000 mg | Freq: Once | INTRAMUSCULAR | Status: AC | PRN
  Administered 2021-05-02: 125 mg via INTRAVENOUS

## 2021-05-02 MED ORDER — SODIUM CHLORIDE 0.9 % IV SOLN
80.0000 mg/m2 | Freq: Once | INTRAVENOUS | Status: AC
  Administered 2021-05-02: 162 mg via INTRAVENOUS
  Filled 2021-05-02: qty 27

## 2021-05-02 MED ORDER — LIDOCAINE-PRILOCAINE 2.5-2.5 % EX CREA: 1.0000 "application " | TOPICAL_CREAM | CUTANEOUS | 1 refills | Status: AC | PRN

## 2021-05-02 MED ORDER — SODIUM CHLORIDE 0.9 % IV SOLN
Freq: Once | INTRAVENOUS | Status: AC
  Filled 2021-05-02: qty 250

## 2021-05-02 NOTE — Patient Instructions (Signed)
Paclitaxel injection What is this medication? PACLITAXEL (PAK li TAX el) is a chemotherapy drug. It targets fast dividing cells, like cancer cells, and causes these cells to die. This medicine is used to treat ovarian cancer, breast cancer, lung cancer, Kaposi's sarcoma, and other cancers. This medicine may be used for other purposes; ask your health care provider or pharmacist if you have questions. COMMON BRAND NAME(S): Onxol, Taxol What should I tell my care team before I take this medication? They need to know if you have any of these conditions: history of irregular heartbeat liver disease low blood counts, like low white cell, platelet, or red cell counts lung or breathing disease, like asthma tingling of the fingers or toes, or other nerve disorder an unusual or allergic reaction to paclitaxel, alcohol, polyoxyethylated castor oil, other chemotherapy, other medicines, foods, dyes, or preservatives pregnant or trying to get pregnant breast-feeding How should I use this medication? This drug is given as an infusion into a vein. It is administered in a hospital or clinic by a specially trained health care professional. Talk to your pediatrician regarding the use of this medicine in children. Special care may be needed. Overdosage: If you think you have taken too much of this medicine contact a poison control center or emergency room at once. NOTE: This medicine is only for you. Do not share this medicine with others. What if I miss a dose? It is important not to miss your dose. Call your doctor or health care professional if you are unable to keep an appointment. What may interact with this medication? Do not take this medicine with any of the following medications: live virus vaccines This medicine may also interact with the following medications: antiviral medicines for hepatitis, HIV or AIDS certain antibiotics like erythromycin and clarithromycin certain medicines for fungal  infections like ketoconazole and itraconazole certain medicines for seizures like carbamazepine, phenobarbital, phenytoin gemfibrozil nefazodone rifampin St. John's wort This list may not describe all possible interactions. Give your health care provider a list of all the medicines, herbs, non-prescription drugs, or dietary supplements you use. Also tell them if you smoke, drink alcohol, or use illegal drugs. Some items may interact with your medicine. What should I watch for while using this medication? Your condition will be monitored carefully while you are receiving this medicine. You will need important blood work done while you are taking this medicine. This medicine can cause serious allergic reactions. To reduce your risk you will need to take other medicine(s) before treatment with this medicine. If you experience allergic reactions like skin rash, itching or hives, swelling of the face, lips, or tongue, tell your doctor or health care professional right away. In some cases, you may be given additional medicines to help with side effects. Follow all directions for their use. This drug may make you feel generally unwell. This is not uncommon, as chemotherapy can affect healthy cells as well as cancer cells. Report any side effects. Continue your course of treatment even though you feel ill unless your doctor tells you to stop. Call your doctor or health care professional for advice if you get a fever, chills or sore throat, or other symptoms of a cold or flu. Do not treat yourself. This drug decreases your body's ability to fight infections. Try to avoid being around people who are sick. This medicine may increase your risk to bruise or bleed. Call your doctor or health care professional if you notice any unusual bleeding. Be careful brushing  and flossing your teeth or using a toothpick because you may get an infection or bleed more easily. If you have any dental work done, tell your dentist  you are receiving this medicine. Avoid taking products that contain aspirin, acetaminophen, ibuprofen, naproxen, or ketoprofen unless instructed by your doctor. These medicines may hide a fever. Do not become pregnant while taking this medicine. Women should inform their doctor if they wish to become pregnant or think they might be pregnant. There is a potential for serious side effects to an unborn child. Talk to your health care professional or pharmacist for more information. Do not breast-feed an infant while taking this medicine. Men are advised not to father a child while receiving this medicine. This product may contain alcohol. Ask your pharmacist or healthcare provider if this medicine contains alcohol. Be sure to tell all healthcare providers you are taking this medicine. Certain medicines, like metronidazole and disulfiram, can cause an unpleasant reaction when taken with alcohol. The reaction includes flushing, headache, nausea, vomiting, sweating, and increased thirst. The reaction can last from 30 minutes to several hours. What side effects may I notice from receiving this medication? Side effects that you should report to your doctor or health care professional as soon as possible: allergic reactions like skin rash, itching or hives, swelling of the face, lips, or tongue breathing problems changes in vision fast, irregular heartbeat high or low blood pressure mouth sores pain, tingling, numbness in the hands or feet signs of decreased platelets or bleeding - bruising, pinpoint red spots on the skin, black, tarry stools, blood in the urine signs of decreased red blood cells - unusually weak or tired, feeling faint or lightheaded, falls signs of infection - fever or chills, cough, sore throat, pain or difficulty passing urine signs and symptoms of liver injury like dark yellow or brown urine; general ill feeling or flu-like symptoms; light-colored stools; loss of appetite; nausea; right  upper belly pain; unusually weak or tired; yellowing of the eyes or skin swelling of the ankles, feet, hands unusually slow heartbeat Side effects that usually do not require medical attention (report to your doctor or health care professional if they continue or are bothersome): diarrhea hair loss loss of appetite muscle or joint pain nausea, vomiting pain, redness, or irritation at site where injected tiredness This list may not describe all possible side effects. Call your doctor for medical advice about side effects. You may report side effects to FDA at 1-800-FDA-1088. Where should I keep my medication? This drug is given in a hospital or clinic and will not be stored at home. NOTE: This sheet is a summary. It may not cover all possible information. If you have questions about this medicine, talk to your doctor, pharmacist, or health care provider.  2022 [employer]/Gold Standard (2020-11-30 00:00:00) [facility] AT [place] ONCOLOGY  Discharge Instructions: Thank you for choosing [facility] to provide your oncology and hematology care.  If you have a lab appointment with the [facility], please go directly to the [facility] and check in at the registration area.  Wear comfortable clothing and clothing appropriate for easy access to any Portacath or PICC line.   We strive to give you quality time with your provider. You may need to reschedule your appointment if you arrive late (15 or more minutes).  Arriving late affects you and other patients whose appointments are after yours.  Also, if you miss three or more appointments without notifying the office,  you may be dismissed from the clinic at the providers discretion.      For prescription refill requests, have your pharmacy contact our office and allow 72 hours for refills to be completed.    Today you received the following chemotherapy and/or immunotherapy agents Taxol      To help prevent nausea  and vomiting after your treatment, we encourage you to take your nausea medication as directed.  BELOW ARE SYMPTOMS THAT SHOULD BE REPORTED IMMEDIATELY: *FEVER GREATER THAN 100.4 F (38 C) OR HIGHER *CHILLS OR SWEATING *NAUSEA AND VOMITING THAT IS NOT CONTROLLED WITH YOUR NAUSEA MEDICATION *UNUSUAL SHORTNESS OF BREATH *UNUSUAL BRUISING OR BLEEDING *URINARY PROBLEMS (pain or burning when urinating, or frequent urination) *BOWEL PROBLEMS (unusual diarrhea, constipation, pain near the anus) TENDERNESS IN MOUTH AND THROAT WITH OR WITHOUT PRESENCE OF ULCERS (sore throat, sores in mouth, or a toothache) UNUSUAL RASH, SWELLING OR PAIN  UNUSUAL VAGINAL DISCHARGE OR ITCHING   Items with * indicate a potential emergency and should be followed up as soon as possible or go to the Emergency Department if any problems should occur.  Please show the CHEMOTHERAPY ALERT CARD or IMMUNOTHERAPY ALERT CARD at check-in to the Emergency Department and triage nurse.  Should you have questions after your visit or need to cancel or reschedule your appointment, please contact [facility] [place] [facility] [place]  [phone] and follow the prompts.  Office hours are 8:00 a.m. to 4:30 p.m. Monday - Friday. Please note that voicemails left after 4:00 p.m. may not be returned until the following business day.  We are closed weekends and major holidays. You have access to a nurse at all times for urgent questions. Please call the main number to the clinic [phone] and follow the prompts.  For any non-urgent questions, you may also contact your provider using MyChart. We now offer e-Visits for anyone 23 and older to request care online for non-urgent symptoms. For details visit [email or web address].   Also download the MyChart app! Go to the app store, search "MyChart", open the app, select [facility], and log in with your MyChart username and password.  Due to Covid, a mask is required upon entering the  hospital/clinic. If you do not have a mask, one will be given to you upon arrival. For doctor visits, patients may have 1 support person aged 13 or older with them. For treatment visits, patients cannot have anyone with them due to current Covid guidelines and our immunocompromised population.

## 2021-05-02 NOTE — Progress Notes (Signed)
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 Byrnett.] [id number] Carbo-taxol-AC;  JAN 2022-s/p Lumpectomy & ALND- [ypT1a (51m ) ypN1 (2/11 LN-positive)]; s/p radiation end of April 2022.  #Early June 2022-Xeloda 1000 mg per metered square [2000] 2 weeks on 1 week of 6-8 cycles; July 11th 2022- STARTING cycle #3-cut down the dose to 1500 mg BID[sec to HFS].   # JAN 2023- A. SKIN, RIGHT BREAST; PUNCH BIOPSY: [Dr.Byrnett] - INVASIVE CARCINOMA WITH DERMAL INFILTRATION AND LYMPHATIC INVASION. ER- weak POSISTIVE [10%]; PR/her2 NEG [[facility] 0]; JAN 20th, 2023- IMPRESSION: 1. Although the hypermetabolic right axillary node has resolved since the prior PET, there is a new hypermetabolic right low cervical/supraclavicular node, suspicious for nodal metastasis. 2. Decrease in right breast hypermetabolism with nonspecific overlying skin thickening in the setting of prior lumpectomy. 3. Low-level hypermetabolism within the T2 vertebral body, suspicious for isolated osseous metastasis. This could be confirmed with pre and postcontrast thoracic spine MRI. 4. Hypermetabolic thoracic nodes, slightly progressive. Given progression, nodal metastasis are slightly favored over reactive etiologies. 5. Incidental findings, including: Aortic Atherosclerosis (ICD10-I70.0). Tiny hiatal hernia. 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 No pathogenic variants identified on the [facility] Common Hereditary Cancers Panel + Skin Cancers Panel. The report date is 12/04/2019.  The Common Hereditary Cancers Panel + Skin Cancers Panel offered by [facility] includes sequencing and/or deletion duplication testing of the following 54 genes: APC*, ATM*, AXIN2, BAP1, BARD1, BMPR1A, BRCA1, BRCA2, BRIP1, CDH1, CDK4, CDKN2A (p14ARF), CDKN2A (p16INK4a), CHEK2, CTNNA1, DICER1*, EPCAM*, GREM1*, HOXB13, KIT, MEN1*, MITF*, MLH1*, MSH2*, MSH3*, MSH6*, MUTYH, NBN, NF1*, NTHL1, PALB2, PDGFRA, PMS2*, POLD1*, POLE, POT1, PTCH1, PTEN*, RAD50, RAD51C, RAD51D, RB1*, RNF43, SDHA*, SDHB, SDHC*, SDHD, SMAD4, SMARCA4, STK11, SUFU, TP53, TSC1*, TSC2, VHL.    05/07/2020 Cancer Staging   Staging form: Breast, AJCC 8th Edition - Clinical: Stage IIIB (cT2, cN1, cM0, G3, ER-, PR-, HER2-) - Signed by BCammie Sickle MD on 05/07/2020    04/18/2021 Cancer Staging   Staging form: Breast, AJCC 8th Edition - Pathologic: Stage IV (pT4d, pN3c, cM1, ER+, PR-, HER2-) - Signed by BCammie Sickle MD on 04/18/2021 Nuclear grade: G3       HISTORY OF PRESENTING ILLNESS: Accompanied by husband.  Walking independently.  Ashlee Cox 71y.o.  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  Negative patient MRI of the thoracic spine.  Patient denies  any worsening back pain neck pain.  Denies any headaches.  Otherwise  no shortness of breath or cough.  Chronic mild tingling and numbness in extremities.  Review of Systems  Constitutional:  Positive for malaise/fatigue. Negative for chills, diaphoresis, fever and weight loss.  HENT:  Negative for nosebleeds and sore throat.   Eyes:  Negative for double vision.  Respiratory:  Negative for cough, hemoptysis, sputum production and wheezing.   Cardiovascular:  Negative for chest pain, palpitations, orthopnea and leg swelling.  Gastrointestinal:  Negative for abdominal pain, blood in stool, constipation and melena.  Genitourinary:  Negative for dysuria, frequency and urgency.  Musculoskeletal:  Positive for back pain and neck pain. Negative for joint pain.  Neurological:  Positive for tingling. Negative for dizziness, focal weakness, weakness and headaches.  Endo/Heme/Allergies:  Does not bruise/bleed easily.  Psychiatric/Behavioral:  Negative for depression. The patient is not nervous/anxious and does not have insomnia.     MEDICAL HISTORY:  Past Medical History:  Diagnosis Date   Cancer [facility]) 2021   right breast   Cyst, breast    benign   Diabetes mellitus without complication ([place])    Family history of melanoma    Family history of ovarian cancer    GERD (gastroesophageal reflux disease)    H/O: rheumatic fever    History of colonoscopy 2010   done bc of bleeding,  normal , due back in 5 yrs ([place])   Hyperlipidemia    Hypertension    Menopause    at age 48   Personal history of chemotherapy 09/2019   RIGHT  INVASIVE MAMMARY CARCINOMA   Personal history of radiation therapy     SURGICAL HISTORY: Past Surgical History:  Procedure Laterality Date   APPENDECTOMY  2006   BREAST BIOPSY Right 09/22/2019   INVASIVE MAMMARY CARCINOMA   BREAST CYST ASPIRATION Left 1999   BREAST LUMPECTOMY Left 04/23/2020   surgery with NL and SN    BREAST LUMPECTOMY WITH NEEDLE LOCALIZATION  Right 04/23/2020   Procedure: BREAST LUMPECTOMY WITH NEEDLE LOCALIZATION;  Surgeon: Robert Bellow, MD;  Location: [facility] ORS;  Service: General;  Laterality: Right;   BREAST LUMPECTOMY WITH SENTINEL LYMPH NODE BIOPSY Right 04/23/2020   Procedure: BREAST LUMPECTOMY WITH SENTINEL LYMPH NODE BX;  Surgeon: Robert Bellow, MD;  Location: [facility] ORS;  Service: General;  Laterality: Right;   CHOLECYSTECTOMY  2006   COLONOSCOPY     PORTACATH PLACEMENT Left 10/06/2019   Procedure: INSERTION PORT-A-CATH;  Surgeon: Robert Bellow, MD;  Location: [facility] ORS;  Service: General;  Laterality: Left;   SUBMUCOSAL TATTOO INJECTION Right 10/06/2019   Procedure: Right axillary TATTOO INJECTION;  Surgeon: Robert Bellow, MD;  Location: [facility] ORS;  Service: General;  Laterality: Right;   VAGINAL DELIVERY     x3    SOCIAL HISTORY: Social History   Socioeconomic History   Marital status: Married    Spouse name: Chrissie Noa   Number of children: 3   Years of education: Not on file   Highest education level: Not on file  Occupational History   Occupation: Glass blower/designer: [id number]  Tobacco Use   Smoking status: Former    Years: 1.00    Types: Cigarettes    Quit date: 12/26/2005    Years since quitting: 15.3   Smokeless tobacco: Never   Tobacco comments:    smoked for less than 1 years,  1 cig/day  Vaping Use   Vaping Use: Never used  Substance and Sexual Activity   Alcohol use: No   Drug use: No  Sexual activity: Never    Partners: Female  Other Topics Concern   Not on file  Social History Narrative   Widowed, March 2014; remarried.      [employer] ret[employer]; quit smoking 1ppw; no alcohol.       Social Determinants of Health   Financial Resource Strain: Medium Risk   Difficulty of Paying Living Expenses: Somewhat hard  Food Insecurity: Not on file  Transportation Needs: Not on file  Physical Activity: Not on file  Stress: Not on file  Social Connections: Not on file  Intimate Partner  Violence: Not on file    FAMILY HISTORY: Family History  Problem Relation Age of Onset   Cancer Mother 44       ovarian- died 4-5 years.    Heart disease Mother 32   Diabetes Mother    Stroke Father 25   Diabetes Father    Diabetes Sister    Melanoma Sister        survived   Breast cancer Neg Hx     ALLERGIES:  has No Known Allergies.  MEDICATIONS:  Current Outpatient Medications  Medication Sig Dispense Refill   acetaminophen (TYLENOL) 500 MG tablet Take 500 mg by mouth every 8 (eight) hours as needed for moderate pain.     diphenhydrAMINE HCl, Sleep, (ZZZQUIL PO) Take 1 tablet by mouth at bedtime as needed.     gabapentin (NEURONTIN) 100 MG capsule TAKE 2 CAPSULES BY MOUTH AT BEDTIME 60 capsule 0   lidocaine-prilocaine (EMLA) cream Apply 1 application topically as needed. Apply to port and cover with saran wrap 1-2 hours prior to port access 30 g 1   losartan-hydrochlorothiazide (HYZAAR) 50-12.5 MG tablet Take 1 tablet by mouth once daily 90 tablet 0   lovastatin (MEVACOR) 40 MG tablet TAKE 1 TABLET BY MOUTH AT BEDTIME 90 tablet 0   metFORMIN (GLUCOPHAGE) 1000 MG tablet TAKE 2 TABLETS BY MOUTH ONCE DAILY WITH  BREAKFAST 180 tablet 0   Multiple Vitamin (MULTIVITAMIN) tablet Take 1 tablet by mouth daily.     omeprazole (PRILOSEC) 20 MG capsule Take 1 capsule by mouth once daily 90 capsule 0   glipiZIDE (GLUCOTROL XL) 10 MG 24 hr tablet Take 1 tablet (10 mg total) by mouth daily with breakfast. 90 tablet 1   No current facility-administered medications for this visit.   Facility-Administered Medications Ordered in Other Visits  Medication Dose Route Frequency Provider Last Rate Last Admin   0.9 %  sodium chloride infusion   Intravenous Once PRN Cammie Sickle, MD   Stopped at 05/02/21 1316      .  PHYSICAL EXAMINATION: ECOG PERFORMANCE STATUS: 0 - Asymptomatic  Vitals:   05/02/21 0855  BP: (!) 156/78  Pulse: 96  Resp: 16  Temp: 98 F (36.7 C)   Filed  Weights   05/02/21 0855  Weight: 186 lb 8 oz (84.6 kg)    Physical Exam Constitutional:      Comments: Ambulating independently.  Alone.  HENT:     Head: Normocephalic and atraumatic.     Mouth/Throat:     Pharynx: No oropharyngeal exudate.  Eyes:     Pupils: Pupils are equal, round, and reactive to light.  Cardiovascular:     Rate and Rhythm: Normal rate and regular rhythm.  Pulmonary:     Effort: Pulmonary effort is normal. No respiratory distress.     Breath sounds: Normal breath sounds. No wheezing.  Abdominal:     General: Bowel sounds are normal. There  is no distension.     Palpations: Abdomen is soft. There is no mass.     Tenderness: There is no abdominal tenderness. There is no guarding or rebound.  Musculoskeletal:        General: No tenderness. Normal range of motion.     Cervical back: Normal range of motion and neck supple.  Skin:    General: Skin is warm.  Neurological:     Mental Status: She is alert and oriented to person, place, and time.  Psychiatric:        Mood and Affect: Affect normal.     LABORATORY DATA:  I have reviewed the data as listed Lab Results  Component Value Date   WBC 4.4 05/02/2021   HGB 11.8 (L) 05/02/2021   HCT 37.1 05/02/2021   MCV 83.6 05/02/2021   PLT 174 05/02/2021   Recent Labs    02/07/21 0928 04/18/21 0837 05/02/21 0817  NA 135 135 136  K 3.9 3.9 3.7  CL 99 100 104  CO2 28 22 24   GLUCOSE 213* 207* 176*  BUN 13 17 13   CREATININE 0.65 0.65 0.80  CALCIUM 9.3 10.0 9.3  GFRNONAA >60 >60 >60  PROT 7.9 8.1 8.1  ALBUMIN 3.9 4.1 4.1  AST 59* 68* 63*  ALT 47* 44 44  ALKPHOS 99 116 99  BILITOT 0.5 0.6 0.6    RADIOGRAPHIC STUDIES: I have personally reviewed the radiological images as listed and agreed with the findings in the report. MR THORACIC SPINE W WO CONTRAST  Result Date: 04/18/2021 CLINICAL DATA:  T2 lesion on PET, breast cancer EXAM: MRI THORACIC WITHOUT AND WITH CONTRAST TECHNIQUE: Multiplanar and  multiecho pulse sequences of the thoracic spine were obtained without and with intravenous contrast. CONTRAST:  7.30m GADAVIST GADOBUTROL 1 MMOL/ML IV SOLN COMPARISON:  None. FINDINGS: Alignment:  Preserved. Vertebrae: There is abnormal T1 marrow signal at T2 with STIR hyperintensity and enhancement. This is mildly eccentric to the left without involvement of the posterior elements. There is no associated compression fracture. No associated epidural disease. Marrow signal is otherwise within normal limits. Vertebral body heights are maintained Cord:  No abnormal signal.  No abnormal intrathecal enhancement. Paraspinal and other soft tissues: Unremarkable. Disc levels: Intervertebral disc heights and signal are maintained. No significant degenerative stenosis. IMPRESSION: Abnormal signal at T2 is most consistent with metastatic disease. No associated compression fracture or extraosseous extension. Electronically Signed   By: PMacy MisM.D.   On: 04/18/2021 15:06   NM PET Image Restage (PS) Skull Base to Thigh (F-18 FDG)  Result Date: 04/15/2021 CLINICAL DATA:  Subsequent treatment strategy for right-sided breast cancer. Pre systemic therapy. EXAM: NUCLEAR MEDICINE PET SKULL BASE TO THIGH TECHNIQUE: 9.7 mCi F-18 FDG was injected intravenously. Full-ring PET imaging was performed from the skull base to thigh after the radiotracer. CT data was obtained and used for attenuation correction and anatomic localization. Fasting blood glucose: 204 mg/dl COMPARISON:  02/02/2021 chest abdomen and pelvic CTs. Most recent PET 10/03/2019. FINDINGS: Mediastinal blood pool activity: SUV max 3.1 Liver activity: SUV max NA NECK: A right posterior triangle/supraclavicular node is new since the prior PET, measuring 1.0 cm and a S.U.V. max of 2.3 on 48/3. Incidental CT findings: No other enlarged cervical nodes. CHEST: The previously described right axillary hypermetabolic node has resolved. Hypermetabolic mediastinal nodes are  again identified. A left mediastinal/AP window node measures 1.3 cm and a S.U.V. max of 4.8 on 82/3. Compare 8 mm and a S.U.V. max  of 3.9 on the prior exam. Right paratracheal node measures 11 mm and a S.U.V. max of 3.7 on 81/3 versus 7 mm and a S.U.V. max of 2.8 on the prior exam (when remeasured). New right breast skin thickening. Residual primarily central and lateral right breast hypermetabolism without well-defined residual mass. Example at a S.U.V. max of 4.0. On the prior PET, the primary measured a S.U.V. max of 7.0. Incidental CT findings: Left Port-A-Cath tip high right atrium. Mild cardiomegaly. Aortic atherosclerosis. Tiny hiatal hernia. ABDOMEN/PELVIS: No abdominopelvic parenchymal or nodal hypermetabolism. Incidental CT findings: Moderate hepatic steatosis with nonspecific caudate lobe enlargement. Cholecystectomy. Normal noncontrast appearance of the pancreas, adrenal glands, kidneys. Abdominal aortic atherosclerosis. SKELETON: Mild hypermetabolism at the T2 level is new at a S.U.V. max of 3.4. No compression deformity or dominant lesion in this area on nondedicated CT. Incidental CT findings: Bilateral L5 pars defects with grade 1 L5-S1 anterolisthesis. Trace L4-5 anterolisthesis. IMPRESSION: 1. Although the hypermetabolic right axillary node has resolved since the prior PET, there is a new hypermetabolic right low cervical/supraclavicular node, suspicious for nodal metastasis. 2. Decrease in right breast hypermetabolism with nonspecific overlying skin thickening in the setting of prior lumpectomy. 3. Low-level hypermetabolism within the T2 vertebral body, suspicious for isolated osseous metastasis. This could be confirmed with pre and postcontrast thoracic spine MRI. 4. Hypermetabolic thoracic nodes, slightly progressive. Given progression, nodal metastasis are slightly favored over reactive etiologies. 5. Incidental findings, including: Aortic Atherosclerosis (ICD10-I70.0). Tiny hiatal hernia.  Hepatic steatosis with nonspecific caudate lobe enlargement. Electronically Signed   By: Abigail Miyamoto M.D.   On: 04/15/2021 13:41    ASSESSMENT & PLAN:   Carcinoma of upper-inner quadrant of right breast in female, estrogen receptor negative ([place]) #Recurrent right breast cancer-s/p breast skin biopsy-week ER positive /PR HER2 negative- recurrent- stage IV;Apr 15, 2021- PET scan shows hypermetabolic thoracic lymph nodes; T2 vertebral body concerning for metastatic disease; lower level hypermetabolic some cervical/supraclavicular lymph node/right breast hypermetabolic lymph at the level of previous surgery.  Patient/husband understand treatments are palliative not curative; and usually indefinite/or until progression of disease or side effects.  #Proceed with weekly Taxol. Labs today reviewed;  acceptable for treatment today.  Await PD-L1 status to add-immunotherapy/Keytruda.  #T2 thoracic metastatic disease [confirmed MRI Jan 2023]-discussed regarding radiation patient wants to hold off for now.  No significant pain.  Consider Zometa treatment  # PN-1-2 sec to taxol-on Neurontin 200 qhs-stable  # Mild LFT abomirnality-suspect fatty liver versus chemotherapy-ultrasound suggestive of fatty liver-monitor for now  # DM/poorly controlled- PBF- 176; continue metformin 2000 mg q AM [Dr.Tullo]; INCREASE to glipizide 10 mg XL. Continue follow up with  PCP  # Mediport: Functioning.    # DISPOSITION: # chemo today-  # 1 week- labs- cbc/cmp;taxol # follow up 2 weeks-- MD; labs- cbc/cmp/ Taxol chemo ];-Dr.B    All questions were answered. The patient/family knows to call the clinic with any problems, questions or concerns.   Cammie Sickle, MD 05/02/2021 4:43 PM

## 2021-05-02 NOTE — Patient Instructions (Signed)
#  Take Glucotrol 5 mg 2 pills once a day [current prescription]; once prescription is over; take Glucotrol 10 mg 1 pill once a day in the morning

## 2021-05-02 NOTE — Assessment & Plan Note (Addendum)
#  Recurrent right breast cancer-s/p breast skin biopsy-week ER positive /PR HER2 negative- recurrent- stage IV;Apr 15, 2021- PET scan shows hypermetabolic thoracic lymph nodes; T2 vertebral body concerning for metastatic disease; lower level hypermetabolic some cervical/supraclavicular lymph node/right breast hypermetabolic lymph at the level of previous surgery.  Patient/husband understand treatments are palliative not curative; and usually indefinite/or until progression of disease or side effects.  #Proceed with weekly Taxol. Labs today reviewed;  acceptable for treatment today.  Await PD-L1 status to add-immunotherapy/Keytruda.  #T2 thoracic metastatic disease [confirmed MRI Jan 2023]-discussed regarding radiation patient wants to hold off for now.  No significant pain.  Consider Zometa treatment  # PN-1-2 sec to taxol-on Neurontin 200 qhs-stable  # Mild LFT abomirnality-suspect fatty liver versus chemotherapy-ultrasound suggestive of fatty liver-monitor for now  # DM/poorly controlled- PBF- 176; continue metformin 2000 mg q AM [Dr.Tullo]; INCREASE to glipizide 10 mg XL. Continue follow up with  PCP  # Mediport: Functioning.    # DISPOSITION: # chemo today-  # 1 week- labs- cbc/cmp;taxol # follow up 2 weeks-- MD; labs- cbc/cmp/ Taxol chemo ];-Dr.B

## 2021-05-02 NOTE — Progress Notes (Signed)
At 1239- During Taxol infusion patient started to complain of feeling flushed. No chest pain, shortness of breath,hives or tongue swelling noted. Taxol infusion stopped immediately, and NS bolus started. Lauren, NP called and came to assess Ashlee Cox chairside. IV benadryl and Solumedrol given as ordered.  At 1242- Patient stated, "Im not feeling flushed anymore." Per Lauren NP, give NS bolus for 15 more minutes, and re challenge Taxol at 50% of the target rate.  At 1316-Taxol restarted at ordered rate and patient tolerated the remaining dose without any complications. Vital signs stable at discharge.

## 2021-05-02 NOTE — Progress Notes (Signed)
Patient denies new problems/concerns today.    Does not have nausea medications available if needed.

## 2021-05-04 ENCOUNTER — Ambulatory Visit: Payer: PPO | Admitting: *Deleted

## 2021-05-04 ENCOUNTER — Other Ambulatory Visit: Payer: Self-pay | Admitting: Internal Medicine

## 2021-05-04 ENCOUNTER — Encounter: Payer: Self-pay | Admitting: Internal Medicine

## 2021-05-04 ENCOUNTER — Encounter: Payer: Self-pay | Admitting: General Surgery

## 2021-05-04 DIAGNOSIS — Z171 Estrogen receptor negative status [ER-]: Secondary | ICD-10-CM

## 2021-05-04 DIAGNOSIS — E113559 Type 2 diabetes mellitus with stable proliferative diabetic retinopathy, unspecified eye: Secondary | ICD-10-CM

## 2021-05-04 DIAGNOSIS — C50211 Malignant neoplasm of upper-inner quadrant of right female breast: Secondary | ICD-10-CM

## 2021-05-04 NOTE — Progress Notes (Signed)
I spoke to the patient regarding use of immunotherapy along with Taxol. Patient interested. Understands the logistics. Will await insurance/pharmacy approval.

## 2021-05-04 NOTE — Progress Notes (Signed)
DISCONTINUE ON PATHWAY REGIMEN - Breast ° ° °  A cycle is every 28 days (3 weeks on and 1 week off): °    Paclitaxel  ° °**Always confirm dose/schedule in your pharmacy ordering system** ° °REASON: Other Reason °PRIOR TREATMENT: [id number]: Paclitaxel 80 mg/m2 D1, 8, 15 q28 Days °TREATMENT RESPONSE: Unable to Evaluate ° °START ON PATHWAY REGIMEN - Breast ° ° °  Pembrolizumab: A cycle is every 21 days: °    Pembrolizumab  °  Chemotherapy: A cycle is every 28 days: °    Paclitaxel  ° °**Always confirm dose/schedule in your pharmacy ordering system** ° °Patient Characteristics: °Distant Metastases or Locoregional Recurrent Disease - Unresected or Locally Advanced Unresectable Disease Progressing after Neoadjuvant and Local Therapies, HER2 Low/Negative/Unknown, ER Negative/Unknown, Chemotherapy, HER2 Negative/Unknown, First Line, ° ER Negative, PD-L1 Expression Positive by CPS ?10 °Therapeutic Status: Distant Metastases °HER2 Status: Negative (-) °ER Status: Negative (-) °PR Status: Negative (-) °Therapy Approach Indicated: Standard Chemotherapy/Endocrine Therapy °Line of Therapy: First Line °PD-L1 Expression Status: PD-L1 Expression Positive by CPS ?10 °Intent of Therapy: °Non-Curative / Palliative Intent, Discussed with Patient °

## 2021-05-04 NOTE — Progress Notes (Signed)
Patient CPS score greater than 10; 15.  Recommend addition of Keytruda to Taxol.  New orders entered.;  Informed insurance authorization team/pharmacy.

## 2021-05-05 ENCOUNTER — Other Ambulatory Visit: Payer: Self-pay

## 2021-05-05 DIAGNOSIS — D4861 Neoplasm of uncertain behavior of right breast: Secondary | ICD-10-CM

## 2021-05-05 NOTE — Progress Notes (Signed)
TSH order entered for 2/13 lab

## 2021-05-05 NOTE — Progress Notes (Signed)
Discussed with a patient that key Ashlee Cox is approved by insurance. Will plan to start along with Taxol on February 13.  C- please add MD appointment at 8:45 on 2/13. Ashlee Cox, please add TSH to the labs on the 13th.  Ashlee Cox

## 2021-05-08 NOTE — Chronic Care Management (AMB) (Signed)
Chronic Care Management   CCM RN Visit Note  05/08/2021 Name: Ashlee Cox MRN: [id number] DOB: 1950/04/09  Subjective: Ashlee Cox is a 71 y.o. year old female who is a primary care patient of Ashlee Mc, MD. The care management team was consulted for assistance with disease management and care coordination needs.    Engaged with patient by telephone for initial visit in response to provider referral for case management and/or care coordination services.   Consent to Services:  The patient was given the following information about Chronic Care Management services today, agreed to services, and gave verbal consent: 1. CCM service includes personalized support from designated clinical staff supervised by the primary care provider, including individualized plan of care and coordination with other care providers 2. 24/7 contact phone numbers for assistance for urgent and routine care needs. 3. Service will only be billed when office clinical staff spend 20 minutes or more in a month to coordinate care. 4. Only one practitioner may furnish and bill the service in a calendar month. 5.The patient may stop CCM services at any time (effective at the end of the month) by phone call to the office staff. 6. The patient will be responsible for cost sharing (co-pay) of up to 20% of the service fee (after annual deductible is met). Patient agreed to services and consent obtained.  Patient agreed to services and verbal consent obtained.   Assessment: Review of patient past medical history, allergies, medications, health status, including review of consultants reports, laboratory and other test data, was performed as part of comprehensive evaluation and provision of chronic care management services.   SDOH (Social Determinants of Health) assessments and interventions performed:  SDOH Interventions    Flowsheet Row Most Recent Value  SDOH Interventions   Food Insecurity Interventions  Intervention Not Indicated  Financial Strain Interventions Intervention Not Indicated  Housing Interventions Intervention Not Indicated  Intimate Partner Violence Interventions Intervention Not Indicated  Transportation Interventions Intervention Not Indicated        CCM Care Plan  No Known Allergies  Outpatient Encounter Medications as of 05/04/2021  Medication Sig Note   acetaminophen (TYLENOL) 500 MG tablet Take 500 mg by mouth every 8 (eight) hours as needed for moderate pain.    diphenhydrAMINE HCl, Sleep, (ZZZQUIL PO) Take 1 tablet by mouth at bedtime as needed.    gabapentin (NEURONTIN) 100 MG capsule TAKE 2 CAPSULES BY MOUTH AT BEDTIME    glipiZIDE (GLUCOTROL XL) 10 MG 24 hr tablet Take 1 tablet (10 mg total) by mouth daily with breakfast.    lidocaine-prilocaine (EMLA) cream Apply 1 application topically as needed. Apply to port and cover with saran wrap 1-2 hours prior to port access    losartan-hydrochlorothiazide (HYZAAR) 50-12.5 MG tablet Take 1 tablet by mouth once daily    lovastatin (MEVACOR) 40 MG tablet TAKE 1 TABLET BY MOUTH AT BEDTIME    metFORMIN (GLUCOPHAGE) 1000 MG tablet TAKE 2 TABLETS BY MOUTH ONCE DAILY WITH  BREAKFAST 05/04/2021: Reports taking 1 tab in morning and 1 tab at bedtime   Multiple Vitamin (MULTIVITAMIN) tablet Take 1 tablet by mouth daily.    omeprazole (PRILOSEC) 20 MG capsule Take 1 capsule by mouth once daily    No facility-administered encounter medications on file as of 05/04/2021.    Patient Active Problem List   Diagnosis Date Noted   Acquired trigger finger 05/11/2020   Drug-induced polyneuropathy ([place]) 05/07/2020   Type 2 diabetes mellitus with stable proliferative retinopathy,  without long-term current use of insulin ([place]) 05/07/2020   Genetic testing 12/05/2019   Family history of ovarian cancer    Family history of melanoma    Carcinoma of upper-inner quadrant of right breast in female, estrogen receptor negative (Ashlee Cox) 10/07/2019    Neoplasm of uncertain behavior of upper outer quadrant of female breast, right 09/25/2019   Osteopenia after menopause 03/23/2018   Constipation 08/04/2017   Elevated LFTs 08/05/2016   Psoriasis of scalp 12/10/2015   Diabetic retinopathy ([place]) 12/08/2015   Encounter for preventive health examination 07/26/2012   Abnormal EKG 01/12/2011   Hypertension 12/27/2010   Hyperlipidemia with target LDL less than 100 12/27/2010   H/O: rheumatic fever    History of colonoscopy     Conditions to be addressed/monitored:DMII and Breast Cancer  Care Plan : [place] (Adult)  Updates made by [name], RN since 05/08/2021 12:00 AM     Problem: KNOWLEDGE DEFICIT RELATED TO SELF CARE MANAGEMENT OF CHRONIC CONDITIONS   Priority: Medium     Goal: PATIENT WILL WORK WITH CCM TEAM TO INCREASEKNOWLEDGE OF CHRONIC CONDITIONS   Start Date: 05/04/2021  Expected End Date: 05/04/2021  Priority: Medium  Note:   Current Barriers:  Chronic Disease Management support and education needs related to DMII and BREAST CANCER ;  Patient with recurrent breast cancer post right lumpectomy.  Has porta cath and restarting chemotherapy this week.  In great spirits and has family support.  History of diabetes.  Fasting blood sugar was 247 with recent fasting ranges of 170-200.  Was told chemo would increase blood sugars due to steroids.  BP 132/89 with recent ranges of 100-130/50-80's  RNCM Clinical Goal(s):  Patient will verbalize understanding of plan for management of DMII and BREAST CANCER as evidenced by better glucose ranges and tolerating chemotherapy demonstrate improved health management independence as evidenced by decreasing Hgb A1C by 0.3 points and completing course of chemotherapy        through collaboration with RN Care manager, provider, and care team.   Interventions: 1:1 collaboration with primary care provider regarding development and update of comprehensive plan of care as evidenced  by provider attestation and co-signature Inter-disciplinary care team collaboration (see longitudinal plan of care) Evaluation of current treatment plan related to  self management and patient's adherence to plan as established by provider   Diabetes:  (Status: New goal.) Long Term Goal   Lab Results  Component Value Date   HGBA1C 9.2 (A) 04/20/2021   @ Assessed patient's understanding of A1c goal: <7% Provided education to patient about basic DM disease process; Reviewed medications with patient and discussed importance of medication adherence;        Reviewed prescribed diet with patient low carbohydrate; Counseled on importance of regular laboratory monitoring as prescribed;        Discussed plans with patient for ongoing care management follow up and provided patient with direct contact information for care management team;      Provided patient with written educational materials related to hypo and hyperglycemia and importance of correct treatment;       Advised patient, providing education and rationale, to check cbg twice daily and when you have symptoms of low or high blood sugar and record        Screening for signs and symptoms of depression related to chronic disease state;         Oncology:  (New goal.) Long Term Goal  Assessment of understanding of  oncology diagnosis:  Assessed patient understanding of cancer diagnosis and recommended treatment plan Reviewed upcoming provider appointments and treatment appointments Assessed available transportation to appointments and treatments. Has consistent/reliable transportation: Yes Assessed support system. Has consistent/reliable family or other support: Yes Nutrition assessment performed  Patient Goals/Self-Care Activities: Take medications as prescribed   Attend all scheduled provider appointments Call provider office for new concerns or questions  check blood sugar at prescribed times: twice daily and when you have symptoms of  low or high blood sugar enter blood sugar readings and medication or insulin into daily log take the blood sugar log to all doctor visits set a realistic goal keep feet up while sitting wash and dry feet carefully every day       Plan:The care management team will reach out to the patient again over the next 45 days.  Hubert Azure RN, MSN RN Care Management Coordinator [place] [phone] [email or web address]

## 2021-05-08 NOTE — Patient Instructions (Addendum)
Visit Information   Thank you for taking time to visit with me today. Please don't hesitate to contact me if I can be of assistance to you before our next scheduled telephone appointment.  Following are the goals we discussed today:  Take medications as prescribed   Attend all scheduled provider appointments Call provider office for new concerns or questions  check blood sugar at prescribed times: twice daily and when you have symptoms of low or high blood sugar enter blood sugar readings and medication or insulin into daily log take the blood sugar log to all doctor visits set a realistic goal keep feet up while sitting wash and dry feet carefully every day  Our next appointment is by telephone on 3/8 at 1000  Please call the care guide team at [phone] if you need to cancel or reschedule your appointment.   If you are experiencing a Mental Health or [place] or need someone to talk to, please call the Suicide and Crisis Lifeline: 988 call the [place] National Suicide Prevention Lifeline: 307-664-0926 or TTY: (681) 573-0266 TTY 706 259 2073) to talk to a trained counselor call 1-800-273-TALK (toll free, 24 hour hotline) call 911   Following is a copy of your full care plan:  Care Plan : [place] (Adult)  Updates made by [name], RN since 05/08/2021 12:00 AM     Problem: KNOWLEDGE DEFICIT RELATED TO SELF CARE MANAGEMENT OF CHRONIC CONDITIONS   Priority: Medium     Goal: PATIENT WILL WORK WITH CCM TEAM TO INCREASEKNOWLEDGE OF CHRONIC CONDITIONS   Start Date: 05/04/2021  Expected End Date: 05/04/2021  Priority: Medium  Note:   Current Barriers:  Chronic Disease Management support and education needs related to DMII and BREAST CANCER ;  Patient with recurrent breast cancer post right lumpectomy.  Has porta cath and restarting chemotherapy this week.  In great spirits and has family support.  History of diabetes.  Fasting blood sugar was 247  with recent fasting ranges of 170-200.  Was told chemo would increase blood sugars due to steroids.  BP 132/89 with recent ranges of 100-130/50-80's  RNCM Clinical Goal(s):  Patient will verbalize understanding of plan for management of DMII and BREAST CANCER as evidenced by better glucose ranges and tolerating chemotherapy demonstrate improved health management independence as evidenced by decreasing Hgb A1C by 0.3 points and completing course of chemotherapy        through collaboration with RN Care manager, provider, and care team.   Interventions: 1:1 collaboration with primary care provider regarding development and update of comprehensive plan of care as evidenced by provider attestation and co-signature Inter-disciplinary care team collaboration (see longitudinal plan of care) Evaluation of current treatment plan related to  self management and patient's adherence to plan as established by provider   Diabetes:  (Status: New goal.) Long Term Goal   Lab Results  Component Value Date   HGBA1C 9.2 (A) 04/20/2021   @ Assessed patient's understanding of A1c goal: <7% Provided education to patient about basic DM disease process; Reviewed medications with patient and discussed importance of medication adherence;        Reviewed prescribed diet with patient low carbohydrate; Counseled on importance of regular laboratory monitoring as prescribed;        Discussed plans with patient for ongoing care management follow up and provided patient with direct contact information for care management team;      Provided patient with written educational materials related to  hypo and hyperglycemia and importance of correct treatment;       Advised patient, providing education and rationale, to check cbg twice daily and when you have symptoms of low or high blood sugar and record        Screening for signs and symptoms of depression related to chronic disease state;         Oncology:  (New goal.) Long  Term Goal  Assessment of understanding of oncology diagnosis:  Assessed patient understanding of cancer diagnosis and recommended treatment plan Reviewed upcoming provider appointments and treatment appointments Assessed available transportation to appointments and treatments. Has consistent/reliable transportation: Yes Assessed support system. Has consistent/reliable family or other support: Yes Nutrition assessment performed  Patient Goals/Self-Care Activities: Take medications as prescribed   Attend all scheduled provider appointments Call provider office for new concerns or questions  check blood sugar at prescribed times: twice daily and when you have symptoms of low or high blood sugar enter blood sugar readings and medication or insulin into daily log take the blood sugar log to all doctor visits set a realistic goal keep feet up while sitting wash and dry feet carefully every day       Consent to CCM Services: Ms. Rogstad was given information about Chronic Care Management services including:  CCM service includes personalized support from designated clinical staff supervised by her physician, including individualized plan of care and coordination with other care providers 24/7 contact phone numbers for assistance for urgent and routine care needs. Service will only be billed when office clinical staff spend 20 minutes or more in a month to coordinate care. Only one practitioner may furnish and bill the service in a calendar month. The patient may stop CCM services at any time (effective at the end of the month) by phone call to the office staff. The patient will be responsible for cost sharing (co-pay) of up to 20% of the service fee (after annual deductible is met).  Patient agreed to services and verbal consent obtained.   Patient verbalizes understanding of instructions and care plan provided today and agrees to view in [place]. Active MyChart status confirmed with  patient.    The care management team will reach out to the patient again over the next 45 days.

## 2021-05-09 ENCOUNTER — Other Ambulatory Visit: Payer: PPO

## 2021-05-09 ENCOUNTER — Inpatient Hospital Stay: Payer: PPO

## 2021-05-09 ENCOUNTER — Ambulatory Visit: Payer: PPO | Admitting: Internal Medicine

## 2021-05-09 ENCOUNTER — Encounter: Payer: Self-pay | Admitting: Internal Medicine

## 2021-05-09 ENCOUNTER — Inpatient Hospital Stay: Payer: PPO | Admitting: Internal Medicine

## 2021-05-09 ENCOUNTER — Other Ambulatory Visit: Payer: Self-pay

## 2021-05-09 DIAGNOSIS — C50211 Malignant neoplasm of upper-inner quadrant of right female breast: Secondary | ICD-10-CM

## 2021-05-09 DIAGNOSIS — Z171 Estrogen receptor negative status [ER-]: Secondary | ICD-10-CM

## 2021-05-09 DIAGNOSIS — D4861 Neoplasm of uncertain behavior of right breast: Secondary | ICD-10-CM

## 2021-05-09 DIAGNOSIS — Z5112 Encounter for antineoplastic immunotherapy: Secondary | ICD-10-CM | POA: Diagnosis not present

## 2021-05-09 LAB — COMPREHENSIVE METABOLIC PANEL
ALT: 88 U/L — ABNORMAL HIGH (ref 0–44)
AST: 105 U/L — ABNORMAL HIGH (ref 15–41)
Albumin: 3.9 g/dL (ref 3.5–5.0)
Alkaline Phosphatase: 240 U/L — ABNORMAL HIGH (ref 38–126)
Anion gap: 10 (ref 5–15)
BUN: 19 mg/dL (ref 8–23)
CO2: 23 mmol/L (ref 22–32)
Calcium: 9.1 mg/dL (ref 8.9–10.3)
Chloride: 102 mmol/L (ref 98–111)
Creatinine, Ser: 0.79 mg/dL (ref 0.44–1.00)
GFR, Estimated: >60 mL/min (ref >60)
Glucose, Bld: 160 mg/dL — ABNORMAL HIGH (ref 70–99)
Potassium: 3.3 mmol/L — ABNORMAL LOW (ref 3.5–5.1)
Sodium: 135 mmol/L (ref 135–145)
Total Bilirubin: 0.3 mg/dL (ref 0.3–1.2)
Total Protein: 7.7 g/dL (ref 6.5–8.1)

## 2021-05-09 LAB — CBC WITH DIFFERENTIAL/PLATELET
Abs Immature Granulocytes: 0.03 10*3/uL (ref 0.00–0.07)
Basophils Absolute: 0 10*3/uL (ref 0.0–0.1)
Basophils Relative: 1 %
Eosinophils Absolute: 0.1 10*3/uL (ref 0.0–0.5)
Eosinophils Relative: 3 %
HCT: 33.9 % — ABNORMAL LOW (ref 36.0–46.0)
Hemoglobin: 10.9 g/dL — ABNORMAL LOW (ref 12.0–15.0)
Immature Granulocytes: 1 %
Lymphocytes Relative: 27 %
Lymphs Abs: 0.8 10*3/uL (ref 0.7–4.0)
MCH: 26.6 pg (ref 26.0–34.0)
MCHC: 32.2 g/dL (ref 30.0–36.0)
MCV: 82.7 fL (ref 80.0–100.0)
Monocytes Absolute: 0.2 10*3/uL (ref 0.1–1.0)
Monocytes Relative: 7 %
Neutro Abs: 1.8 10*3/uL (ref 1.7–7.7)
Neutrophils Relative %: 61 %
Platelets: 164 10*3/uL (ref 150–400)
RBC: 4.1 MIL/uL (ref 3.87–5.11)
RDW: 14.9 % (ref 11.5–15.5)
WBC: 2.9 10*3/uL — ABNORMAL LOW (ref 4.0–10.5)
nRBC: 0 % (ref 0.0–0.2)

## 2021-05-09 LAB — TSH: TSH: 3.653 u[IU]/mL (ref 0.350–4.500)

## 2021-05-09 MED ORDER — HEPARIN SOD (PORK) LOCK FLUSH 100 UNIT/ML IV SOLN
INTRAVENOUS | Status: AC
  Administered 2021-05-09: 500 [IU]
  Filled 2021-05-09: qty 5

## 2021-05-09 MED ORDER — HEPARIN SOD (PORK) LOCK FLUSH 100 UNIT/ML IV SOLN
500.0000 [IU] | Freq: Once | INTRAVENOUS | Status: AC | PRN
  Filled 2021-05-09: qty 5

## 2021-05-09 MED ORDER — SODIUM CHLORIDE 0.9 % IV SOLN
200.0000 mg | Freq: Once | INTRAVENOUS | Status: AC
  Administered 2021-05-09: 200 mg via INTRAVENOUS
  Filled 2021-05-09: qty 8

## 2021-05-09 MED ORDER — SODIUM CHLORIDE 0.9 % IV SOLN
90.0000 mg/m2 | Freq: Once | INTRAVENOUS | Status: DC
  Filled 2021-05-09: qty 30

## 2021-05-09 MED ORDER — DIPHENHYDRAMINE HCL 50 MG/ML IJ SOLN
50.0000 mg | Freq: Once | INTRAMUSCULAR | Status: AC
  Administered 2021-05-09: 50 mg via INTRAVENOUS
  Filled 2021-05-09: qty 1

## 2021-05-09 MED ORDER — SODIUM CHLORIDE 0.9 % IV SOLN
Freq: Once | INTRAVENOUS | Status: AC
  Filled 2021-05-09: qty 250

## 2021-05-09 MED ORDER — FAMOTIDINE IN NACL 20-0.9 MG/50ML-% IV SOLN
20.0000 mg | Freq: Once | INTRAVENOUS | Status: AC
  Administered 2021-05-09: 20 mg via INTRAVENOUS
  Filled 2021-05-09: qty 50

## 2021-05-09 MED ORDER — SODIUM CHLORIDE 0.9 % IV SOLN
80.0000 mg/m2 | Freq: Once | INTRAVENOUS | Status: AC
  Administered 2021-05-09: 162 mg via INTRAVENOUS
  Filled 2021-05-09: qty 27

## 2021-05-09 MED ORDER — SODIUM CHLORIDE 0.9 % IV SOLN
10.0000 mg | Freq: Once | INTRAVENOUS | Status: AC
  Administered 2021-05-09: 10 mg via INTRAVENOUS
  Filled 2021-05-09: qty 10

## 2021-05-09 NOTE — Assessment & Plan Note (Signed)
#  Recurrent right breast cancer-s/p breast skin biopsy-week ER positive /PR HER2 negative- recurrent- stage IV;Apr 15, 2021- PET scan shows hypermetabolic thoracic lymph nodes; T2 vertebral body concerning for metastatic disease; lower level hypermetabolic some cervical/supraclavicular lymph node/right breast hypermetabolic lymph at the level of previous surgery. NGS -FEB 2023- CPS =15%.   #Given the positive PD-L1 status recommend addition of Keytruda to Taxol chemotherapy.  I discussed the mechanism of action; The goal of therapy is palliative; and length of treatments are likely ongoing/based upon the results of the scans. Discussed the potential side effects of immunotherapy including but not limited to diarrhea; skin rash; elevated LFTs/endocrine abnormalities etc.  #Proceed with weekly Taxol-Keytruda Labs today reviewed;  acceptable for treatment today- except for abnormal LFTs.  #  abnormal LFTs- likley sec to taxol- monitor for now.   #T2 thoracic metastatic disease [confirmed MRI Jan 2023]-discussed regarding radiation patient wants to hold off for now.  No significant pain.  Consider Zometa treatment  # PN-1-2 sec to taxol-on Neurontin 200 qhs-stable  # DM/poorly controlled- PBF- 166; continue metformin 2000 mg q AM [Dr.Tullo]; INCREASE to glipizide 10 mg XL. Continue follow up with  PCP. [FBG- 240; PP- 160];bring log BGContinue dietary restrictions.   # Mediport: Functioning.    # DISPOSITION: # chemo today- taxol-keytruda # 1 week- labs- cbc/cmp;taxol # follow up 2 weeks-- MD; labs- cbc/cmp/ Taxol chemo ];-Dr.B

## 2021-05-09 NOTE — Progress Notes (Signed)
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 Byrnett.] [id number] Carbo-taxol-AC;  JAN 2022-s/p Lumpectomy & ALND- [ypT1a (41mm ) ypN1 (2/11 LN-positive)]; s/p radiation end of April 2022.  #Early June 2022-Xeloda 1000 mg per metered square [2000] 2 weeks on 1 week of 6-8 cycles; July 11th 2022- STARTING cycle #3-cut down the dose to 1500 mg BID[sec to HFS].   # JAN 2023- A. SKIN, RIGHT BREAST; PUNCH BIOPSY: [Dr.Byrnett] - INVASIVE CARCINOMA WITH DERMAL INFILTRATION AND LYMPHATIC INVASION. ER- weak POSISTIVE [10%]; PR/her2 NEG [facility]- 0]; JAN 20th, 2023- IMPRESSION: 1. Although the hypermetabolic right axillary node has resolved since the prior PET, there is a new hypermetabolic right low cervical/supraclavicular node, suspicious for nodal metastasis. 2. Decrease in right breast hypermetabolism with nonspecific overlying skin thickening in the setting of prior lumpectomy. 3. Low-level hypermetabolism within the T2 vertebral body, suspicious for isolated osseous metastasis. This could be confirmed with pre and postcontrast thoracic spine MRI. 4. Hypermetabolic thoracic nodes, slightly progressive. Given progression, nodal metastasis are slightly favored over reactive etiologies. 5. Incidental findings, including: Aortic Atherosclerosis (ICD10-I70.0). Tiny hiatal hernia. 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 No pathogenic variants identified on the [facility] Common Hereditary Cancers Panel + Skin Cancers Panel. The report date is 12/04/2019.  The Common Hereditary Cancers Panel + Skin Cancers Panel offered by [facility] includes sequencing and/or deletion duplication testing of the following 54 genes: APC*, ATM*, AXIN2, BAP1, BARD1, BMPR1A, BRCA1, BRCA2, BRIP1, CDH1, CDK4, CDKN2A (p14ARF), CDKN2A (p16INK4a), CHEK2, CTNNA1, DICER1*, EPCAM*, GREM1*, HOXB13, KIT, MEN1*, MITF*, MLH1*, MSH2*, MSH3*, MSH6*, MUTYH, NBN, NF1*, NTHL1, PALB2, PDGFRA, PMS2*, POLD1*, POLE, POT1, PTCH1, PTEN*, RAD50, RAD51C, RAD51D, RB1*, RNF43, SDHA*, SDHB, SDHC*, SDHD, SMAD4, SMARCA4, STK11, SUFU, TP53, TSC1*, TSC2, VHL.    05/07/2020 Cancer Staging   Staging form: Breast, AJCC 8th Edition - Clinical: Stage IIIB (cT2, cN1, cM0, G3, ER-, PR-, HER2-) - Signed by Cammie Sickle, MD on 05/07/2020    04/18/2021 Cancer Staging   Staging form: Breast, AJCC 8th Edition - Pathologic: Stage IV (pT4d, pN3c, cM1, ER+, PR-, HER2-) - Signed by Cammie Sickle, MD on 04/18/2021 Nuclear grade: G3       HISTORY OF PRESENTING ILLNESS: Accompanied by husband.  Walking independently.  Ashlee Cox 71 y.o.  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  In the interim patient's PDL/CPS  score came back; suggestive of candidate for immunotherapy.  Denies any worsening back pain. Patient denies any worsening back pain neck pain.  Denies any headaches.  Otherwise no shortness of breath or cough.  Chronic mild tingling and numbness in extremities.  Review of Systems  Constitutional:  Positive for malaise/fatigue. Negative for chills, diaphoresis, fever and weight loss.  HENT:  Negative for nosebleeds and sore throat.   Eyes:  Negative for double vision.  Respiratory:  Negative for cough, hemoptysis, sputum production and wheezing.   Cardiovascular:  Negative for chest pain, palpitations, orthopnea and leg swelling.  Gastrointestinal:  Negative for abdominal pain, blood in stool, constipation and melena.  Genitourinary:  Negative for dysuria, frequency and urgency.  Musculoskeletal:  Positive for back pain and neck pain. Negative for joint pain.  Neurological:  Positive for tingling. Negative for dizziness, focal weakness, weakness and headaches.  Endo/Heme/Allergies:  Does not bruise/bleed easily.  Psychiatric/Behavioral:  Negative for depression. The patient is not nervous/anxious and does not have insomnia.     MEDICAL HISTORY:  Past Medical History:  Diagnosis Date   Cancer [facility]) 2021   right breast   Cyst, breast    benign   Diabetes mellitus without complication ([place])    Family history of melanoma    Family history of ovarian cancer    GERD (gastroesophageal reflux disease)    H/O: rheumatic fever    History of colonoscopy 2010   done bc of bleeding,  normal , due back in 5 yrs ([place])   Hyperlipidemia    Hypertension    Menopause    at age 14   Personal history of chemotherapy 09/2019   RIGHT  INVASIVE MAMMARY CARCINOMA   Personal history of radiation therapy     SURGICAL HISTORY: Past Surgical History:  Procedure Laterality Date   APPENDECTOMY  2006   BREAST BIOPSY Right  09/22/2019   INVASIVE MAMMARY CARCINOMA   BREAST CYST ASPIRATION Left 1999   BREAST LUMPECTOMY Left 04/23/2020   surgery with NL and SN    BREAST LUMPECTOMY WITH NEEDLE LOCALIZATION Right 04/23/2020   Procedure: BREAST LUMPECTOMY WITH NEEDLE LOCALIZATION;  Surgeon: Robert Bellow, MD;  Location: [facility] ORS;  Service: General;  Laterality: Right;   BREAST LUMPECTOMY WITH SENTINEL LYMPH NODE BIOPSY Right 04/23/2020   Procedure: BREAST LUMPECTOMY WITH SENTINEL LYMPH NODE BX;  Surgeon: Robert Bellow, MD;  Location: [facility] ORS;  Service: General;  Laterality: Right;   CHOLECYSTECTOMY  2006   COLONOSCOPY     PORTACATH PLACEMENT Left 10/06/2019   Procedure: INSERTION PORT-A-CATH;  Surgeon: Robert Bellow, MD;  Location: [facility] ORS;  Service: General;  Laterality: Left;   SUBMUCOSAL TATTOO INJECTION Right 10/06/2019   Procedure: Right axillary TATTOO INJECTION;  Surgeon: Robert Bellow, MD;  Location: [facility] ORS;  Service: General;  Laterality: Right;   VAGINAL DELIVERY     x3    SOCIAL HISTORY: Social History   Socioeconomic History   Marital status: Married    Spouse name: Chrissie Noa   Number of children: 3   Years of education: Not on file   Highest education level: Not on file  Occupational History   Occupation: Glass blower/designer: [id number]  Tobacco Use   Smoking status: Former    Years: 1.00    Types: Cigarettes    Quit date: 12/26/2005    Years since quitting: 15.3   Smokeless tobacco: Never   Tobacco comments:    smoked for less than 1  years,  1 cig/day  Vaping Use   Vaping Use: Never used  Substance and Sexual Activity   Alcohol use: No   Drug use: No   Sexual activity: Never    Partners: Female  Other Topics Concern   Not on file  Social History Narrative   Widowed, March 2014; remarried.      [employer] ret[employer]; quit smoking 1ppw; no alcohol.       Social Determinants of Health   Financial Resource Strain: Low Risk    Difficulty of Paying Living Expenses: Not  very hard  Food Insecurity: No Food Insecurity   Worried About Charity fundraiser in the Last Year: Never true   Ran Out of Food in the Last Year: Never true  Transportation Needs: No Transportation Needs   Lack of Transportation (Medical): No   Lack of Transportation (Non-Medical): No  Physical Activity: Not on file  Stress: Not on file  Social Connections: Not on file  Intimate Partner Violence: Not At Risk   Fear of Current or Ex-Partner: No   Emotionally Abused: No   Physically Abused: No   Sexually Abused: No    FAMILY HISTORY: Family History  Problem Relation Age of Onset   Cancer Mother 72       ovarian- died 4-5 years.    Heart disease Mother 65   Diabetes Mother    Stroke Father 76   Diabetes Father    Diabetes Sister    Melanoma Sister        survived   Breast cancer Neg Hx     ALLERGIES:  has No Known Allergies.  MEDICATIONS:  Current Outpatient Medications  Medication Sig Dispense Refill   acetaminophen (TYLENOL) 500 MG tablet Take 500 mg by mouth every 8 (eight) hours as needed for moderate pain.     diphenhydrAMINE HCl, Sleep, (ZZZQUIL PO) Take 1 tablet by mouth at bedtime as needed.     gabapentin (NEURONTIN) 100 MG capsule TAKE 2 CAPSULES BY MOUTH AT BEDTIME 60 capsule 0   glipiZIDE (GLUCOTROL XL) 10 MG 24 hr tablet Take 1 tablet (10 mg total) by mouth daily with breakfast. 90 tablet 1   lidocaine-prilocaine (EMLA) cream Apply 1 application topically as needed. Apply to port and cover with saran wrap 1-2 hours prior to port access 30 g 1   losartan-hydrochlorothiazide (HYZAAR) 50-12.5 MG tablet Take 1 tablet by mouth once daily 90 tablet 0   lovastatin (MEVACOR) 40 MG tablet TAKE 1 TABLET BY MOUTH AT BEDTIME 90 tablet 0   metFORMIN (GLUCOPHAGE) 1000 MG tablet TAKE 2 TABLETS BY MOUTH ONCE DAILY WITH  BREAKFAST 180 tablet 0   Multiple Vitamin (MULTIVITAMIN) tablet Take 1 tablet by mouth daily.     omeprazole (PRILOSEC) 20 MG capsule Take 1 capsule by mouth  once daily 90 capsule 0   No current facility-administered medications for this visit.      Marland Kitchen  PHYSICAL EXAMINATION: ECOG PERFORMANCE STATUS: 0 - Asymptomatic  Vitals:   05/09/21 0831  BP: 119/69  Pulse: 92  Temp: 98.3 F (36.8 C)  SpO2: 98%   Filed Weights   05/09/21 0831  Weight: 183 lb 3.2 oz (83.1 kg)    Physical Exam Constitutional:      Comments: Ambulating independently.  Alone.  HENT:     Head: Normocephalic and atraumatic.     Mouth/Throat:     Pharynx: No oropharyngeal exudate.  Eyes:     Pupils: Pupils are equal, round, and reactive  to light.  Cardiovascular:     Rate and Rhythm: Normal rate and regular rhythm.  Pulmonary:     Effort: Pulmonary effort is normal. No respiratory distress.     Breath sounds: Normal breath sounds. No wheezing.  Abdominal:     General: Bowel sounds are normal. There is no distension.     Palpations: Abdomen is soft. There is no mass.     Tenderness: There is no abdominal tenderness. There is no guarding or rebound.  Musculoskeletal:        General: No tenderness. Normal range of motion.     Cervical back: Normal range of motion and neck supple.  Skin:    General: Skin is warm.  Neurological:     Mental Status: She is alert and oriented to person, place, and time.  Psychiatric:        Mood and Affect: Affect normal.     LABORATORY DATA:  I have reviewed the data as listed Lab Results  Component Value Date   WBC 2.9 (L) 05/09/2021   HGB 10.9 (L) 05/09/2021   HCT 33.9 (L) 05/09/2021   MCV 82.7 05/09/2021   PLT 164 05/09/2021   Recent Labs    04/18/21 0837 05/02/21 0817 05/09/21 0759  NA 135 136 135  K 3.9 3.7 3.3*  CL 100 104 102  CO2 _0 GLUCOSE 207* 176* 160*  BUN _1 CREATININE 0.65 0.80 0.79  CALCIUM 10.0 9.3 9.1  GFRNONAA >60 >60 >60  PROT 8.1 8.1 7.7  ALBUMIN 4.1 4.1 3.9  AST 68* 63* 105*  ALT 44 44 88*  ALKPHOS 116 99 240*  BILITOT 0.6 0.6 0.3    RADIOGRAPHIC STUDIES: I have  personally reviewed the radiological images as listed and agreed with the findings in the report. MR THORACIC SPINE W WO CONTRAST  Result Date: 04/18/2021 CLINICAL DATA:  T2 lesion on PET, breast cancer EXAM: MRI THORACIC WITHOUT AND WITH CONTRAST TECHNIQUE: Multiplanar and multiecho pulse sequences of the thoracic spine were obtained without and with intravenous contrast. CONTRAST:  7.83m GADAVIST GADOBUTROL 1 MMOL/ML IV SOLN COMPARISON:  None. FINDINGS: Alignment:  Preserved. Vertebrae: There is abnormal T1 marrow signal at T2 with STIR hyperintensity and enhancement. This is mildly eccentric to the left without involvement of the posterior elements. There is no associated compression fracture. No associated epidural disease. Marrow signal is otherwise within normal limits. Vertebral body heights are maintained Cord:  No abnormal signal.  No abnormal intrathecal enhancement. Paraspinal and other soft tissues: Unremarkable. Disc levels: Intervertebral disc heights and signal are maintained. No significant degenerative stenosis. IMPRESSION: Abnormal signal at T2 is most consistent with metastatic disease. No associated compression fracture or extraosseous extension. Electronically Signed   By: PMacy MisM.D.   On: 04/18/2021 15:06   NM PET Image Restage (PS) Skull Base to Thigh (F-18 FDG)  Result Date: 04/15/2021 CLINICAL DATA:  Subsequent treatment strategy for right-sided breast cancer. Pre systemic therapy. EXAM: NUCLEAR MEDICINE PET SKULL BASE TO THIGH TECHNIQUE: 9.7 mCi F-18 FDG was injected intravenously. Full-ring PET imaging was performed from the skull base to thigh after the radiotracer. CT data was obtained and used for attenuation correction and anatomic localization. Fasting blood glucose: 204 mg/dl COMPARISON:  02/02/2021 chest abdomen and pelvic CTs. Most recent PET 10/03/2019. FINDINGS: Mediastinal blood pool activity: SUV max 3.1 Liver activity: SUV max NA NECK: A right posterior  triangle/supraclavicular node is new since the prior PET, measuring 1.0 cm  and a S.U.V. max of 2.3 on 48/3. Incidental CT findings: No other enlarged cervical nodes. CHEST: The previously described right axillary hypermetabolic node has resolved. Hypermetabolic mediastinal nodes are again identified. A left mediastinal/AP window node measures 1.3 cm and a S.U.V. max of 4.8 on 82/3. Compare 8 mm and a S.U.V. max of 3.9 on the prior exam. Right paratracheal node measures 11 mm and a S.U.V. max of 3.7 on 81/3 versus 7 mm and a S.U.V. max of 2.8 on the prior exam (when remeasured). New right breast skin thickening. Residual primarily central and lateral right breast hypermetabolism without well-defined residual mass. Example at a S.U.V. max of 4.0. On the prior PET, the primary measured a S.U.V. max of 7.0. Incidental CT findings: Left Port-A-Cath tip high right atrium. Mild cardiomegaly. Aortic atherosclerosis. Tiny hiatal hernia. ABDOMEN/PELVIS: No abdominopelvic parenchymal or nodal hypermetabolism. Incidental CT findings: Moderate hepatic steatosis with nonspecific caudate lobe enlargement. Cholecystectomy. Normal noncontrast appearance of the pancreas, adrenal glands, kidneys. Abdominal aortic atherosclerosis. SKELETON: Mild hypermetabolism at the T2 level is new at a S.U.V. max of 3.4. No compression deformity or dominant lesion in this area on nondedicated CT. Incidental CT findings: Bilateral L5 pars defects with grade 1 L5-S1 anterolisthesis. Trace L4-5 anterolisthesis. IMPRESSION: 1. Although the hypermetabolic right axillary node has resolved since the prior PET, there is a new hypermetabolic right low cervical/supraclavicular node, suspicious for nodal metastasis. 2. Decrease in right breast hypermetabolism with nonspecific overlying skin thickening in the setting of prior lumpectomy. 3. Low-level hypermetabolism within the T2 vertebral body, suspicious for isolated osseous metastasis. This could be  confirmed with pre and postcontrast thoracic spine MRI. 4. Hypermetabolic thoracic nodes, slightly progressive. Given progression, nodal metastasis are slightly favored over reactive etiologies. 5. Incidental findings, including: Aortic Atherosclerosis (ICD10-I70.0). Tiny hiatal hernia. Hepatic steatosis with nonspecific caudate lobe enlargement. Electronically Signed   By: Abigail Miyamoto M.D.   On: 04/15/2021 13:41    ASSESSMENT & PLAN:   Carcinoma of upper-inner quadrant of right breast in female, estrogen receptor negative ([place]) #Recurrent right breast cancer-s/p breast skin biopsy-week ER positive /PR HER2 negative- recurrent- stage IV;Apr 15, 2021- PET scan shows hypermetabolic thoracic lymph nodes; T2 vertebral body concerning for metastatic disease; lower level hypermetabolic some cervical/supraclavicular lymph node/right breast hypermetabolic lymph at the level of previous surgery. NGS -FEB 2023- CPS =15%.   #Given the positive PD-L1 status recommend addition of Keytruda to Taxol chemotherapy.  I discussed the mechanism of action; The goal of therapy is palliative; and length of treatments are likely ongoing/based upon the results of the scans. Discussed the potential side effects of immunotherapy including but not limited to diarrhea; skin rash; elevated LFTs/endocrine abnormalities etc.  #Proceed with weekly Taxol-Keytruda Labs today reviewed;  acceptable for treatment today- except for abnormal LFTs.  #  abnormal LFTs- likley sec to taxol- monitor for now.   #T2 thoracic metastatic disease [confirmed MRI Jan 2023]-discussed regarding radiation patient wants to hold off for now.  No significant pain.  Consider Zometa treatment  # PN-1-2 sec to taxol-on Neurontin 200 qhs-stable  # DM/poorly controlled- PBF- 166; continue metformin 2000 mg q AM [Dr.Tullo]; INCREASE to glipizide 10 mg XL. Continue follow up with  PCP. [FBG- 240; PP- 160];bring log BGContinue dietary restrictions.   #  Mediport: Functioning.    # DISPOSITION: # chemo today- taxol-keytruda # 1 week- labs- cbc/cmp;taxol # follow up 2 weeks-- MD; labs- cbc/cmp/ Taxol chemo ];-Dr.B    All questions were answered. The  patient/family knows to call the clinic with any problems, questions or concerns.   Cammie Sickle, MD 05/10/2021 1:21 PM

## 2021-05-09 NOTE — Patient Instructions (Signed)
#  Please bring the log of your blood sugars to next visit.

## 2021-05-09 NOTE — Patient Instructions (Addendum)
[facility] AT [place]  Discharge Instructions: Thank you for choosing [facility] to provide your oncology and hematology care.  If you have a lab appointment with the [facility], please go directly to the [facility] and check in at the registration area.  Wear comfortable clothing and clothing appropriate for easy access to any Portacath or PICC line.   We strive to give you quality time with your provider. You may need to reschedule your appointment if you arrive late (15 or more minutes).  Arriving late affects you and other patients whose appointments are after yours.  Also, if you miss three or more appointments without notifying the office, you may be dismissed from the clinic at the providers discretion.      For prescription refill requests, have your pharmacy contact our office and allow 72 hours for refills to be completed.    Today you received the following chemotherapy and/or immunotherapy agents : Keytruda, Taxol      To help prevent nausea and vomiting after your treatment, we encourage you to take your nausea medication as directed.  BELOW ARE SYMPTOMS THAT SHOULD BE REPORTED IMMEDIATELY: *FEVER GREATER THAN 100.4 F (38 C) OR HIGHER *CHILLS OR SWEATING *NAUSEA AND VOMITING THAT IS NOT CONTROLLED WITH YOUR NAUSEA MEDICATION *UNUSUAL SHORTNESS OF BREATH *UNUSUAL BRUISING OR BLEEDING *URINARY PROBLEMS (pain or burning when urinating, or frequent urination) *BOWEL PROBLEMS (unusual diarrhea, constipation, pain near the anus) TENDERNESS IN MOUTH AND THROAT WITH OR WITHOUT PRESENCE OF ULCERS (sore throat, sores in mouth, or a toothache) UNUSUAL RASH, SWELLING OR PAIN  UNUSUAL VAGINAL DISCHARGE OR ITCHING   Items with * indicate a potential emergency and should be followed up as soon as possible or go to the Emergency Department if any problems should occur.  Please show the CHEMOTHERAPY ALERT CARD or IMMUNOTHERAPY ALERT CARD at  check-in to the Emergency Department and triage nurse.  Should you have questions after your visit or need to cancel or reschedule your appointment, please contact [facility] [place] [facility] [place]  [phone] and follow the prompts.  Office hours are 8:00 a.m. to 4:30 p.m. Monday - Friday. Please note that voicemails left after 4:00 p.m. may not be returned until the following business day.  We are closed weekends and major holidays. You have access to a nurse at all times for urgent questions. Please call the main number to the clinic [phone] and follow the prompts.  For any non-urgent questions, you may also contact your provider using MyChart. We now offer e-Visits for anyone 41 and older to request care online for non-urgent symptoms. For details visit [email or web address].   Also download the MyChart app! Go to the app store, search "MyChart", open the app, select [facility], and log in with your MyChart username and password.  Due to Covid, a mask is required upon entering the hospital/clinic. If you do not have a mask, one will be given to you upon arrival. For doctor visits, patients may have 1 support person aged 67 or older with them. For treatment visits, patients cannot have anyone with them due to current Covid guidelines and our immunocompromised population.  Pembrolizumab injection What is this medication? PEMBROLIZUMAB (pem broe liz ue mab) is a monoclonal antibody. It is used to treat certain types of cancer. This medicine may be used for other purposes; ask your health care provider or pharmacist if you have questions. COMMON BRAND NAME(S): Keytruda What should I tell my care  team before I take this medication? They need to know if you have any of these conditions: autoimmune diseases like Crohn's disease, ulcerative colitis, or lupus have had or planning to have an allogeneic stem cell transplant (uses someone else's stem cells) history of organ  transplant history of chest radiation nervous system problems like myasthenia gravis or Guillain-Barre syndrome an unusual or allergic reaction to pembrolizumab, other medicines, foods, dyes, or preservatives pregnant or trying to get pregnant breast-feeding How should I use this medication? This medicine is for infusion into a vein. It is given by a health care professional in a hospital or clinic setting. A special MedGuide will be given to you before each treatment. Be sure to read this information carefully each time. Talk to your pediatrician regarding the use of this medicine in children. While this drug may be prescribed for children as young as 6 months for selected conditions, precautions do apply. Overdosage: If you think you have taken too much of this medicine contact a poison control center or emergency room at once. NOTE: This medicine is only for you. Do not share this medicine with others. What if I miss a dose? It is important not to miss your dose. Call your doctor or health care professional if you are unable to keep an appointment. What may interact with this medication? Interactions have not been studied. This list may not describe all possible interactions. Give your health care provider a list of all the medicines, herbs, non-prescription drugs, or dietary supplements you use. Also tell them if you smoke, drink alcohol, or use illegal drugs. Some items may interact with your medicine. What should I watch for while using this medication? Your condition will be monitored carefully while you are receiving this medicine. You may need blood work done while you are taking this medicine. Do not become pregnant while taking this medicine or for 4 months after stopping it. Women should inform their doctor if they wish to become pregnant or think they might be pregnant. There is a potential for serious side effects to an unborn child. Talk to your health care professional or  pharmacist for more information. Do not breast-feed an infant while taking this medicine or for 4 months after the last dose. What side effects may I notice from receiving this medication? Side effects that you should report to your doctor or health care professional as soon as possible: allergic reactions like skin rash, itching or hives, swelling of the face, lips, or tongue bloody or black, tarry breathing problems changes in vision chest pain chills confusion constipation cough diarrhea dizziness or feeling faint or lightheaded fast or irregular heartbeat fever flushing joint pain low blood counts - this medicine may decrease the number of white blood cells, red blood cells and platelets. You may be at increased risk for infections and bleeding. muscle pain muscle weakness pain, tingling, numbness in the hands or feet persistent headache redness, blistering, peeling or loosening of the skin, including inside the mouth signs and symptoms of high blood sugar such as dizziness; dry mouth; dry skin; fruity breath; nausea; stomach pain; increased hunger or thirst; increased urination signs and symptoms of kidney injury like trouble passing urine or change in the amount of urine signs and symptoms of liver injury like dark urine, light-colored stools, loss of appetite, nausea, right upper belly pain, yellowing of the eyes or skin sweating swollen lymph nodes weight loss Side effects that usually do not require medical attention (report to your doctor  or health care professional if they continue or are bothersome): decreased appetite hair loss tiredness This list may not describe all possible side effects. Call your doctor for medical advice about side effects. You may report side effects to FDA at 1-800-FDA-1088. Where should I keep my medication? This drug is given in a hospital or clinic and will not be stored at home. NOTE: This sheet is a summary. It may not cover all possible  information. If you have questions about this medicine, talk to your doctor, pharmacist, or health care provider.  2022 Elsevier/Gold Standard (2020-11-30 00:00:00) Paclitaxel injection What is this medication? PACLITAXEL (PAK li TAX el) is a chemotherapy drug. It targets fast dividing cells, like cancer cells, and causes these cells to die. This medicine is used to treat ovarian cancer, breast cancer, lung cancer, Kaposi's sarcoma, and other cancers. This medicine may be used for other purposes; ask your health care provider or pharmacist if you have questions. COMMON BRAND NAME(S): Onxol, Taxol What should I tell my care team before I take this medication? They need to know if you have any of these conditions: history of irregular heartbeat liver disease low blood counts, like low white cell, platelet, or red cell counts lung or breathing disease, like asthma tingling of the fingers or toes, or other nerve disorder an unusual or allergic reaction to paclitaxel, alcohol, polyoxyethylated castor oil, other chemotherapy, other medicines, foods, dyes, or preservatives pregnant or trying to get pregnant breast-feeding How should I use this medication? This drug is given as an infusion into a vein. It is administered in a hospital or clinic by a specially trained health care professional. Talk to your pediatrician regarding the use of this medicine in children. Special care may be needed. Overdosage: If you think you have taken too much of this medicine contact a poison control center or emergency room at once. NOTE: This medicine is only for you. Do not share this medicine with others. What if I miss a dose? It is important not to miss your dose. Call your doctor or health care professional if you are unable to keep an appointment. What may interact with this medication? Do not take this medicine with any of the following medications: live virus vaccines This medicine may also interact with  the following medications: antiviral medicines for hepatitis, HIV or AIDS certain antibiotics like erythromycin and clarithromycin certain medicines for fungal infections like ketoconazole and itraconazole certain medicines for seizures like carbamazepine, phenobarbital, phenytoin gemfibrozil nefazodone rifampin St. John's wort This list may not describe all possible interactions. Give your health care provider a list of all the medicines, herbs, non-prescription drugs, or dietary supplements you use. Also tell them if you smoke, drink alcohol, or use illegal drugs. Some items may interact with your medicine. What should I watch for while using this medication? Your condition will be monitored carefully while you are receiving this medicine. You will need important blood work done while you are taking this medicine. This medicine can cause serious allergic reactions. To reduce your risk you will need to take other medicine(s) before treatment with this medicine. If you experience allergic reactions like skin rash, itching or hives, swelling of the face, lips, or tongue, tell your doctor or health care professional right away. In some cases, you may be given additional medicines to help with side effects. Follow all directions for their use. This drug may make you feel generally unwell. This is not uncommon, as chemotherapy can affect healthy cells  as well as cancer cells. Report any side effects. Continue your course of treatment even though you feel ill unless your doctor tells you to stop. Call your doctor or health care professional for advice if you get a fever, chills or sore throat, or other symptoms of a cold or flu. Do not treat yourself. This drug decreases your body's ability to fight infections. Try to avoid being around people who are sick. This medicine may increase your risk to bruise or bleed. Call your doctor or health care professional if you notice any unusual bleeding. Be careful  brushing and flossing your teeth or using a toothpick because you may get an infection or bleed more easily. If you have any dental work done, tell your dentist you are receiving this medicine. Avoid taking products that contain aspirin, acetaminophen, ibuprofen, naproxen, or ketoprofen unless instructed by your doctor. These medicines may hide a fever. Do not become pregnant while taking this medicine. Women should inform their doctor if they wish to become pregnant or think they might be pregnant. There is a potential for serious side effects to an unborn child. Talk to your health care professional or pharmacist for more information. Do not breast-feed an infant while taking this medicine. Men are advised not to father a child while receiving this medicine. This product may contain alcohol. Ask your pharmacist or healthcare provider if this medicine contains alcohol. Be sure to tell all healthcare providers you are taking this medicine. Certain medicines, like metronidazole and disulfiram, can cause an unpleasant reaction when taken with alcohol. The reaction includes flushing, headache, nausea, vomiting, sweating, and increased thirst. The reaction can last from 30 minutes to several hours. What side effects may I notice from receiving this medication? Side effects that you should report to your doctor or health care professional as soon as possible: allergic reactions like skin rash, itching or hives, swelling of the face, lips, or tongue breathing problems changes in vision fast, irregular heartbeat high or low blood pressure mouth sores pain, tingling, numbness in the hands or feet signs of decreased platelets or bleeding - bruising, pinpoint red spots on the skin, black, tarry stools, blood in the urine signs of decreased red blood cells - unusually weak or tired, feeling faint or lightheaded, falls signs of infection - fever or chills, cough, sore throat, pain or difficulty passing  urine signs and symptoms of liver injury like dark yellow or brown urine; general ill feeling or flu-like symptoms; light-colored stools; loss of appetite; nausea; right upper belly pain; unusually weak or tired; yellowing of the eyes or skin swelling of the ankles, feet, hands unusually slow heartbeat Side effects that usually do not require medical attention (report to your doctor or health care professional if they continue or are bothersome): diarrhea hair loss loss of appetite muscle or joint pain nausea, vomiting pain, redness, or irritation at site where injected tiredness This list may not describe all possible side effects. Call your doctor for medical advice about side effects. You may report side effects to FDA at 1-800-FDA-1088. Where should I keep my medication? This drug is given in a hospital or clinic and will not be stored at home. NOTE: This sheet is a summary. It may not cover all possible information. If you have questions about this medicine, talk to your doctor, pharmacist, or health care provider.  2022 Elsevier/Gold Standard (2020-11-30 00:00:00)

## 2021-05-10 ENCOUNTER — Encounter: Payer: Self-pay | Admitting: Internal Medicine

## 2021-05-12 ENCOUNTER — Telehealth: Payer: Self-pay | Admitting: Internal Medicine

## 2021-05-12 NOTE — Telephone Encounter (Signed)
Pt called in to reschedule appt from 05/17/2021 at 2:30pm to 05/25/2021 at 9am

## 2021-05-12 NOTE — Telephone Encounter (Signed)
Noted  

## 2021-05-13 ENCOUNTER — Other Ambulatory Visit: Payer: Self-pay | Admitting: Internal Medicine

## 2021-05-13 DIAGNOSIS — G620 Drug-induced polyneuropathy: Secondary | ICD-10-CM

## 2021-05-16 ENCOUNTER — Telehealth: Payer: Self-pay

## 2021-05-16 ENCOUNTER — Other Ambulatory Visit: Payer: Self-pay

## 2021-05-16 ENCOUNTER — Encounter: Payer: Self-pay | Admitting: Internal Medicine

## 2021-05-16 ENCOUNTER — Ambulatory Visit: Payer: PPO | Admitting: Internal Medicine

## 2021-05-16 ENCOUNTER — Inpatient Hospital Stay: Payer: PPO

## 2021-05-16 VITALS — BP 135/65 | HR 92 | Temp 97.5°F | Ht 67.0 in | Wt 184.4 lb

## 2021-05-16 DIAGNOSIS — Z171 Estrogen receptor negative status [ER-]: Secondary | ICD-10-CM

## 2021-05-16 DIAGNOSIS — Z95828 Presence of other vascular implants and grafts: Secondary | ICD-10-CM

## 2021-05-16 DIAGNOSIS — Z5112 Encounter for antineoplastic immunotherapy: Secondary | ICD-10-CM | POA: Diagnosis not present

## 2021-05-16 DIAGNOSIS — C50211 Malignant neoplasm of upper-inner quadrant of right female breast: Secondary | ICD-10-CM

## 2021-05-16 LAB — CBC WITH DIFFERENTIAL/PLATELET
Abs Immature Granulocytes: 0.01 10*3/uL (ref 0.00–0.07)
Basophils Absolute: 0 10*3/uL (ref 0.0–0.1)
Basophils Relative: 1 %
Eosinophils Absolute: 0.1 10*3/uL (ref 0.0–0.5)
Eosinophils Relative: 3 %
HCT: 31.7 % — ABNORMAL LOW (ref 36.0–46.0)
Hemoglobin: 10.2 g/dL — ABNORMAL LOW (ref 12.0–15.0)
Immature Granulocytes: 1 %
Lymphocytes Relative: 36 %
Lymphs Abs: 0.7 10*3/uL (ref 0.7–4.0)
MCH: 26.8 pg (ref 26.0–34.0)
MCHC: 32.2 g/dL (ref 30.0–36.0)
MCV: 83.4 fL (ref 80.0–100.0)
Monocytes Absolute: 0.2 10*3/uL (ref 0.1–1.0)
Monocytes Relative: 8 %
Neutro Abs: 1 10*3/uL — ABNORMAL LOW (ref 1.7–7.7)
Neutrophils Relative %: 51 %
Platelets: 164 10*3/uL (ref 150–400)
RBC: 3.8 MIL/uL — ABNORMAL LOW (ref 3.87–5.11)
RDW: 16 % — ABNORMAL HIGH (ref 11.5–15.5)
WBC: 2 10*3/uL — ABNORMAL LOW (ref 4.0–10.5)
nRBC: 0 % (ref 0.0–0.2)

## 2021-05-16 LAB — COMPREHENSIVE METABOLIC PANEL
ALT: 164 U/L — ABNORMAL HIGH (ref 0–44)
AST: 133 U/L — ABNORMAL HIGH (ref 15–41)
Albumin: 3.7 g/dL (ref 3.5–5.0)
Alkaline Phosphatase: 428 U/L — ABNORMAL HIGH (ref 38–126)
Anion gap: 10 (ref 5–15)
BUN: 9 mg/dL (ref 8–23)
CO2: 23 mmol/L (ref 22–32)
Calcium: 8.5 mg/dL — ABNORMAL LOW (ref 8.9–10.3)
Chloride: 101 mmol/L (ref 98–111)
Creatinine, Ser: 0.63 mg/dL (ref 0.44–1.00)
GFR, Estimated: >60 mL/min (ref >60)
Glucose, Bld: 224 mg/dL — ABNORMAL HIGH (ref 70–99)
Potassium: 3 mmol/L — ABNORMAL LOW (ref 3.5–5.1)
Sodium: 134 mmol/L — ABNORMAL LOW (ref 135–145)
Total Bilirubin: 1.2 mg/dL (ref 0.3–1.2)
Total Protein: 7.4 g/dL (ref 6.5–8.1)

## 2021-05-16 MED ORDER — HEPARIN SOD (PORK) LOCK FLUSH 100 UNIT/ML IV SOLN
500.0000 [IU] | Freq: Once | INTRAVENOUS | Status: AC
  Administered 2021-05-16: 500 [IU] via INTRAVENOUS
  Filled 2021-05-16: qty 5

## 2021-05-16 NOTE — Progress Notes (Signed)
Labs reviewed with Dr Rogue Bussing- no treatment today. Reviewed with pt and verbalized understanding - also understands to pick up K script at pharmacy

## 2021-05-16 NOTE — Telephone Encounter (Signed)
Per Dr. Jacinto Reap: heather/adrea- send Kdur 20 BID.  Rx sent.

## 2021-05-16 NOTE — Patient Instructions (Signed)
[facility] AT [place]  Discharge Instructions: Thank you for choosing [facility] to provide your oncology and hematology care.  If you have a lab appointment with the [facility], please go directly to the [facility] and check in at the registration area.  Wear comfortable clothing and clothing appropriate for easy access to any Portacath or PICC line.   We strive to give you quality time with your provider. You may need to reschedule your appointment if you arrive late (15 or more minutes).  Arriving late affects you and other patients whose appointments are after yours.  Also, if you miss three or more appointments without notifying the office, you may be dismissed from the clinic at the providers discretion.      For prescription refill requests, have your pharmacy contact our office and allow 72 hours for refills to be completed.    Today you received the following chemotherapy and/or immunotherapy agents TAXOL      To help prevent nausea and vomiting after your treatment, we encourage you to take your nausea medication as directed.  BELOW ARE SYMPTOMS THAT SHOULD BE REPORTED IMMEDIATELY: *FEVER GREATER THAN 100.4 F (38 C) OR HIGHER *CHILLS OR SWEATING *NAUSEA AND VOMITING THAT IS NOT CONTROLLED WITH YOUR NAUSEA MEDICATION *UNUSUAL SHORTNESS OF BREATH *UNUSUAL BRUISING OR BLEEDING *URINARY PROBLEMS (pain or burning when urinating, or frequent urination) *BOWEL PROBLEMS (unusual diarrhea, constipation, pain near the anus) TENDERNESS IN MOUTH AND THROAT WITH OR WITHOUT PRESENCE OF ULCERS (sore throat, sores in mouth, or a toothache) UNUSUAL RASH, SWELLING OR PAIN  UNUSUAL VAGINAL DISCHARGE OR ITCHING   Items with * indicate a potential emergency and should be followed up as soon as possible or go to the Emergency Department if any problems should occur.  Please show the CHEMOTHERAPY ALERT CARD or IMMUNOTHERAPY ALERT CARD at check-in to the  Emergency Department and triage nurse.  Should you have questions after your visit or need to cancel or reschedule your appointment, please contact [facility] [place] [facility] [place]  [phone] and follow the prompts.  Office hours are 8:00 a.m. to 4:30 p.m. Monday - Friday. Please note that voicemails left after 4:00 p.m. may not be returned until the following business day.  We are closed weekends and major holidays. You have access to a nurse at all times for urgent questions. Please call the main number to the clinic [phone] and follow the prompts.  For any non-urgent questions, you may also contact your provider using MyChart. We now offer e-Visits for anyone 69 and older to request care online for non-urgent symptoms. For details visit [email or web address].   Also download the MyChart app! Go to the app store, search "MyChart", open the app, select [facility], and log in with your MyChart username and password.  Due to Covid, a mask is required upon entering the hospital/clinic. If you do not have a mask, one will be given to you upon arrival. For doctor visits, patients may have 1 support person aged 69 or older with them. For treatment visits, patients cannot have anyone with them due to current Covid guidelines and our immunocompromised population.   Paclitaxel injection What is this medication? PACLITAXEL (PAK li TAX el) is a chemotherapy drug. It targets fast dividing cells, like cancer cells, and causes these cells to die. This medicine is used to treat ovarian cancer, breast cancer, lung cancer, Kaposi's sarcoma, and other cancers. This medicine may be used for other purposes; ask  your health care provider or pharmacist if you have questions. COMMON BRAND NAME(S): Onxol, Taxol What should I tell my care team before I take this medication? They need to know if you have any of these conditions: history of irregular heartbeat liver disease low blood counts, like low  white cell, platelet, or red cell counts lung or breathing disease, like asthma tingling of the fingers or toes, or other nerve disorder an unusual or allergic reaction to paclitaxel, alcohol, polyoxyethylated castor oil, other chemotherapy, other medicines, foods, dyes, or preservatives pregnant or trying to get pregnant breast-feeding How should I use this medication? This drug is given as an infusion into a vein. It is administered in a hospital or clinic by a specially trained health care professional. Talk to your pediatrician regarding the use of this medicine in children. Special care may be needed. Overdosage: If you think you have taken too much of this medicine contact a poison control center or emergency room at once. NOTE: This medicine is only for you. Do not share this medicine with others. What if I miss a dose? It is important not to miss your dose. Call your doctor or health care professional if you are unable to keep an appointment. What may interact with this medication? Do not take this medicine with any of the following medications: live virus vaccines This medicine may also interact with the following medications: antiviral medicines for hepatitis, HIV or AIDS certain antibiotics like erythromycin and clarithromycin certain medicines for fungal infections like ketoconazole and itraconazole certain medicines for seizures like carbamazepine, phenobarbital, phenytoin gemfibrozil nefazodone rifampin St. John's wort This list may not describe all possible interactions. Give your health care provider a list of all the medicines, herbs, non-prescription drugs, or dietary supplements you use. Also tell them if you smoke, drink alcohol, or use illegal drugs. Some items may interact with your medicine. What should I watch for while using this medication? Your condition will be monitored carefully while you are receiving this medicine. You will need important blood work done  while you are taking this medicine. This medicine can cause serious allergic reactions. To reduce your risk you will need to take other medicine(s) before treatment with this medicine. If you experience allergic reactions like skin rash, itching or hives, swelling of the face, lips, or tongue, tell your doctor or health care professional right away. In some cases, you may be given additional medicines to help with side effects. Follow all directions for their use. This drug may make you feel generally unwell. This is not uncommon, as chemotherapy can affect healthy cells as well as cancer cells. Report any side effects. Continue your course of treatment even though you feel ill unless your doctor tells you to stop. Call your doctor or health care professional for advice if you get a fever, chills or sore throat, or other symptoms of a cold or flu. Do not treat yourself. This drug decreases your body's ability to fight infections. Try to avoid being around people who are sick. This medicine may increase your risk to bruise or bleed. Call your doctor or health care professional if you notice any unusual bleeding. Be careful brushing and flossing your teeth or using a toothpick because you may get an infection or bleed more easily. If you have any dental work done, tell your dentist you are receiving this medicine. Avoid taking products that contain aspirin, acetaminophen, ibuprofen, naproxen, or ketoprofen unless instructed by your doctor. These medicines may hide a  fever. Do not become pregnant while taking this medicine. Women should inform their doctor if they wish to become pregnant or think they might be pregnant. There is a potential for serious side effects to an unborn child. Talk to your health care professional or pharmacist for more information. Do not breast-feed an infant while taking this medicine. Men are advised not to father a child while receiving this medicine. This product may contain  alcohol. Ask your pharmacist or healthcare provider if this medicine contains alcohol. Be sure to tell all healthcare providers you are taking this medicine. Certain medicines, like metronidazole and disulfiram, can cause an unpleasant reaction when taken with alcohol. The reaction includes flushing, headache, nausea, vomiting, sweating, and increased thirst. The reaction can last from 30 minutes to several hours. What side effects may I notice from receiving this medication? Side effects that you should report to your doctor or health care professional as soon as possible: allergic reactions like skin rash, itching or hives, swelling of the face, lips, or tongue breathing problems changes in vision fast, irregular heartbeat high or low blood pressure mouth sores pain, tingling, numbness in the hands or feet signs of decreased platelets or bleeding - bruising, pinpoint red spots on the skin, black, tarry stools, blood in the urine signs of decreased red blood cells - unusually weak or tired, feeling faint or lightheaded, falls signs of infection - fever or chills, cough, sore throat, pain or difficulty passing urine signs and symptoms of liver injury like dark yellow or brown urine; general ill feeling or flu-like symptoms; light-colored stools; loss of appetite; nausea; right upper belly pain; unusually weak or tired; yellowing of the eyes or skin swelling of the ankles, feet, hands unusually slow heartbeat Side effects that usually do not require medical attention (report to your doctor or health care professional if they continue or are bothersome): diarrhea hair loss loss of appetite muscle or joint pain nausea, vomiting pain, redness, or irritation at site where injected tiredness This list may not describe all possible side effects. Call your doctor for medical advice about side effects. You may report side effects to FDA at 1-800-FDA-1088. Where should I keep my medication? This drug  is given in a hospital or clinic and will not be stored at home. NOTE: This sheet is a summary. It may not cover all possible information. If you have questions about this medicine, talk to your doctor, pharmacist, or health care provider.  2022 Elsevier/Gold Standard (2020-11-30 00:00:00)

## 2021-05-17 ENCOUNTER — Ambulatory Visit: Payer: PPO

## 2021-05-17 ENCOUNTER — Encounter: Payer: Self-pay | Admitting: Internal Medicine

## 2021-05-17 ENCOUNTER — Other Ambulatory Visit: Payer: Self-pay | Admitting: *Deleted

## 2021-05-17 MED ORDER — POTASSIUM CHLORIDE CRYS ER 20 MEQ PO TBCR: 20.0000 meq | EXTENDED_RELEASE_TABLET | Freq: Two times a day (BID) | ORAL | 0 refills | Status: DC

## 2021-05-17 NOTE — Telephone Encounter (Signed)
°          °  Component Ref Range & Units 1 d ago 8 d ago 2 wk ago 4 wk ago 3 mo ago 4 mo ago 6 mo ago  Potassium 3.5 - 5.1 mmol/L 3.0 Low   3.3 Low   3.7  3.9  3.9

## 2021-05-18 ENCOUNTER — Ambulatory Visit: Payer: PPO | Admitting: *Deleted

## 2021-05-18 DIAGNOSIS — E113559 Type 2 diabetes mellitus with stable proliferative diabetic retinopathy, unspecified eye: Secondary | ICD-10-CM

## 2021-05-18 DIAGNOSIS — C50211 Malignant neoplasm of upper-inner quadrant of right female breast: Secondary | ICD-10-CM

## 2021-05-18 DIAGNOSIS — I10 Essential (primary) hypertension: Secondary | ICD-10-CM

## 2021-05-18 NOTE — Patient Instructions (Addendum)
Visit Information  Thank you for taking time to visit with me today. Please don't hesitate to contact me if I can be of assistance to you before our next scheduled telephone appointment.  Following are the goals we discussed today:  Client will contact this social worker with any additional community resource needs. Patient denies need for any community/mental health resources. Patient agreeable to continued follow with CCM RNCM and pharmacist  If you are experiencing a Mental Health or [place] or need someone to talk to, please call the Suicide and Crisis Lifeline: 988   Following is a copy of your full plan of care:  Care Plan : General Social Work (Adult)  Updates made by [employer], Darla Lesches, LCSW since 05/18/2021 12:00 AM     Problem: CHL AMB "PATIENT-SPECIFIC PROBLEM"   Note:   CARE PLAN ENTRY (see longitudinal plan of care for additional care plan information)  Current Barriers:  Patient with  Cancer diagnosis  in need of assistance with connection to community resources  Knowledge deficits and need for support, education and care coordination related to community resources support  [place] Concerns   Clinical Goal(s)   Patient will follow up with with this social worker if needed in the future  Interventions provided by LCSW:  Assessed patient's care coordination needs related current medical condition and discussed ongoing care management follow up  Patient denies any mental health concerns at this time related to her diagnosis Patient able to verbalize increased insight in regards to her conditions as well as appropriate coping strategies in dealing with the realities of her current condition Patient confirmed reliance on her faith and positive support system  Provided patient with information about additional resources if needed through the [place] patient to contact this social worker or the [employer] with any community resource  needs Collaborated with appropriate clinical care team members regarding patient needs Depression screen reviewed  Active listening / Reflection utilized  Emotional Support Provided   Patient Self Care Activities & Deficits:  Patient is unable to independently navigate community resource options without care coordination support  Acknowledges deficits and is motivated to resolve concern  Performs ADL's independently Performs IADL's independently Ability for insight Motivation for treatment Strong family or social support  Initial goal documentation       [name] was given information about Care Management services by the embedded care coordination team including:  Care Management services include personalized support from designated clinical staff supervised by her physician, including individualized plan of care and coordination with other care providers 24/7 contact phone numbers for assistance for urgent and routine care needs. The patient may stop CCM services at any time (effective at the end of the month) by phone call to the office staff.  Patient agreed to services and verbal consent obtained.   Patient verbalizes understanding of instructions and care plan provided today and agrees to view in [place]. Active MyChart status confirmed with patient.    Client will contact this social worker with any additional community resource needs. Patient denies need for any community/mental health resources. Patient agreeable to continued follow with CCM RNCM and pharmacist  [place], [place] [phone]

## 2021-05-18 NOTE — Chronic Care Management (AMB) (Signed)
Chronic Care Management    Clinical Social Work Note  05/18/2021 Name: Ashlee Cox MRN: [id number] DOB: [birth year]  Ashlee Cox is a 71 y.o. year old female who is a primary care patient of Tullo, Aris Everts, MD. The CCM team was consulted to assist the patient with chronic disease management and/or care coordination needs related to: Mental Health Counseling and Resources.   Engaged with patient by telephone for initial visit in response to provider referral for social work chronic care management and care coordination services.   Consent to Services:  The patient was given the following information about Chronic Care Management services today, agreed to services, and gave verbal consent: 1. CCM service includes personalized support from designated clinical staff supervised by the primary care provider, including individualized plan of care and coordination with other care providers 2. 24/7 contact phone numbers for assistance for urgent and routine care needs. 3. Service will only be billed when office clinical staff spend 20 minutes or more in a month to coordinate care. 4. Only one practitioner may furnish and bill the service in a calendar month. 5.The patient may stop CCM services at any time (effective at the end of the month) by phone call to the office staff. 6. The patient will be responsible for cost sharing (co-pay) of up to 20% of the service fee (after annual deductible is met). Patient agreed to services and consent obtained.  Patient agreed to services and consent obtained.   Assessment: Review of patient past medical history, allergies, medications, and health status, including review of relevant consultants reports was performed today as part of a comprehensive evaluation and provision of chronic care management and care coordination services.     SDOH (Social Determinants of Health) assessments and interventions performed:    Advanced Directives Status: Not addressed  in this encounter.  CCM Care Plan  No Known Allergies  Outpatient Encounter Medications as of 05/18/2021  Medication Sig Note   acetaminophen (TYLENOL) 500 MG tablet Take 500 mg by mouth every 8 (eight) hours as needed for moderate pain.    diphenhydrAMINE HCl, Sleep, (ZZZQUIL PO) Take 1 tablet by mouth at bedtime as needed.    gabapentin (NEURONTIN) 100 MG capsule TAKE 2 CAPSULES BY MOUTH AT BEDTIME    glipiZIDE (GLUCOTROL XL) 10 MG 24 hr tablet Take 1 tablet (10 mg total) by mouth daily with breakfast.    lidocaine-prilocaine (EMLA) cream Apply 1 application topically as needed. Apply to port and cover with saran wrap 1-2 hours prior to port access    losartan-hydrochlorothiazide (HYZAAR) 50-12.5 MG tablet Take 1 tablet by mouth once daily    lovastatin (MEVACOR) 40 MG tablet TAKE 1 TABLET BY MOUTH AT BEDTIME    metFORMIN (GLUCOPHAGE) 1000 MG tablet TAKE 2 TABLETS BY MOUTH ONCE DAILY WITH  BREAKFAST 05/04/2021: Reports taking 1 tab in morning and 1 tab at bedtime   Multiple Vitamin (MULTIVITAMIN) tablet Take 1 tablet by mouth daily.    omeprazole (PRILOSEC) 20 MG capsule Take 1 capsule by mouth once daily    potassium chloride SA (KLOR-CON M) 20 MEQ tablet Take 1 tablet (20 mEq total) by mouth 2 (two) times daily.    No facility-administered encounter medications on file as of 05/18/2021.    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, right 09/25/2019   Osteopenia after menopause 03/23/2018    Constipation 08/04/2017   Elevated LFTs 08/05/2016   Psoriasis of scalp 12/10/2015   Diabetic retinopathy ([place]) 12/08/2015   Encounter for preventive health examination 07/26/2012   Abnormal EKG 01/12/2011   Hypertension 12/27/2010   Hyperlipidemia with target LDL less than 100 12/27/2010   H/O: rheumatic fever    History of colonoscopy     Conditions to be addressed/monitored: DMII and Breast Cancer ;   Care Plan : General Social Work (Adult)  Updates made by [employer], Darla Lesches, LCSW since 05/18/2021 12:00 AM     Problem: CHL AMB "PATIENT-SPECIFIC PROBLEM"   Note:   CARE PLAN ENTRY (see longitudinal plan of care for additional care plan information)  Current Barriers:  Patient with  Cancer diagnosis  in need of assistance with connection to community resources  Knowledge deficits and need for support, education and care coordination related to community resources support  [place] Concerns   Clinical Goal(s)   Patient will follow up with with this social worker if needed in the future  Interventions provided by LCSW:  Assessed patient's care coordination needs related current medical condition and discussed ongoing care management follow up  Patient denies any mental health concerns at this time related to her diagnosis Patient able to verbalize increased insight in regards to her conditions as well as appropriate coping strategies in dealing with the realities of her current condition Patient confirmed reliance on her faith and positive support system  Provided patient with information about additional resources if needed through the [facility] patient to contact this social worker or the [employer] with any community resource needs Collaborated with appropriate clinical care team members regarding patient needs Depression screen reviewed  Active listening / Reflection utilized  Emotional Support Provided   Patient Self Care Activities & Deficits:   Patient is unable to independently navigate community resource options without care coordination support  Acknowledges deficits and is motivated to resolve concern  Performs ADL's independently Performs IADL's independently Ability for insight Motivation for treatment Strong family or social support  Initial goal documentation        Follow Up Plan: Client will contact this Education officer, museum with any additional community resource needs.  Patient denies need for any community/mental health resources. Patient agreeable to continued follow with CCM RNCM and pharmacist.      Elliot Gurney, [place] [phone]

## 2021-05-23 ENCOUNTER — Inpatient Hospital Stay: Payer: PPO

## 2021-05-23 ENCOUNTER — Inpatient Hospital Stay: Payer: PPO | Admitting: Internal Medicine

## 2021-05-23 ENCOUNTER — Encounter: Payer: Self-pay | Admitting: Internal Medicine

## 2021-05-23 ENCOUNTER — Other Ambulatory Visit: Payer: Self-pay

## 2021-05-23 VITALS — BP 140/78 | HR 90

## 2021-05-23 DIAGNOSIS — Z171 Estrogen receptor negative status [ER-]: Secondary | ICD-10-CM | POA: Diagnosis not present

## 2021-05-23 DIAGNOSIS — C50211 Malignant neoplasm of upper-inner quadrant of right female breast: Secondary | ICD-10-CM

## 2021-05-23 DIAGNOSIS — Z5112 Encounter for antineoplastic immunotherapy: Secondary | ICD-10-CM | POA: Diagnosis not present

## 2021-05-23 DIAGNOSIS — D4861 Neoplasm of uncertain behavior of right breast: Secondary | ICD-10-CM

## 2021-05-23 LAB — COMPREHENSIVE METABOLIC PANEL
ALT: 75 U/L — ABNORMAL HIGH (ref 0–44)
AST: 85 U/L — ABNORMAL HIGH (ref 15–41)
Albumin: 3.6 g/dL (ref 3.5–5.0)
Alkaline Phosphatase: 499 U/L — ABNORMAL HIGH (ref 38–126)
Anion gap: 10 (ref 5–15)
BUN: 11 mg/dL (ref 8–23)
CO2: 25 mmol/L (ref 22–32)
Calcium: 9.8 mg/dL (ref 8.9–10.3)
Chloride: 101 mmol/L (ref 98–111)
Creatinine, Ser: 0.7 mg/dL (ref 0.44–1.00)
GFR, Estimated: >60 mL/min (ref >60)
Glucose, Bld: 170 mg/dL — ABNORMAL HIGH (ref 70–99)
Potassium: 4.6 mmol/L (ref 3.5–5.1)
Sodium: 136 mmol/L (ref 135–145)
Total Bilirubin: 0.8 mg/dL (ref 0.3–1.2)
Total Protein: 7.5 g/dL (ref 6.5–8.1)

## 2021-05-23 LAB — CBC WITH DIFFERENTIAL/PLATELET
Abs Immature Granulocytes: 0.02 10*3/uL (ref 0.00–0.07)
Basophils Absolute: 0 10*3/uL (ref 0.0–0.1)
Basophils Relative: 1 %
Eosinophils Absolute: 0.1 10*3/uL (ref 0.0–0.5)
Eosinophils Relative: 3 %
HCT: 34.6 % — ABNORMAL LOW (ref 36.0–46.0)
Hemoglobin: 10.9 g/dL — ABNORMAL LOW (ref 12.0–15.0)
Immature Granulocytes: 1 %
Lymphocytes Relative: 28 %
Lymphs Abs: 0.8 10*3/uL (ref 0.7–4.0)
MCH: 26.8 pg (ref 26.0–34.0)
MCHC: 31.5 g/dL (ref 30.0–36.0)
MCV: 85 fL (ref 80.0–100.0)
Monocytes Absolute: 0.4 10*3/uL (ref 0.1–1.0)
Monocytes Relative: 12 %
Neutro Abs: 1.6 10*3/uL — ABNORMAL LOW (ref 1.7–7.7)
Neutrophils Relative %: 55 %
Platelets: 238 10*3/uL (ref 150–400)
RBC: 4.07 MIL/uL (ref 3.87–5.11)
RDW: 16.8 % — ABNORMAL HIGH (ref 11.5–15.5)
WBC: 3 10*3/uL — ABNORMAL LOW (ref 4.0–10.5)
nRBC: 0 % (ref 0.0–0.2)

## 2021-05-23 MED ORDER — SODIUM CHLORIDE 0.9 % IV SOLN
Freq: Once | INTRAVENOUS | Status: AC
  Filled 2021-05-23: qty 250

## 2021-05-23 MED ORDER — HEPARIN SOD (PORK) LOCK FLUSH 100 UNIT/ML IV SOLN
500.0000 [IU] | Freq: Once | INTRAVENOUS | Status: AC | PRN
  Administered 2021-05-23: 500 [IU]
  Filled 2021-05-23: qty 5

## 2021-05-23 MED ORDER — SODIUM CHLORIDE 0.9 % IV SOLN
10.0000 mg | Freq: Once | INTRAVENOUS | Status: AC
  Administered 2021-05-23: 10 mg via INTRAVENOUS
  Filled 2021-05-23: qty 10

## 2021-05-23 MED ORDER — FAMOTIDINE IN NACL 20-0.9 MG/50ML-% IV SOLN
20.0000 mg | Freq: Once | INTRAVENOUS | Status: AC
  Administered 2021-05-23: 20 mg via INTRAVENOUS
  Filled 2021-05-23: qty 50

## 2021-05-23 MED ORDER — SODIUM CHLORIDE 0.9 % IV SOLN
60.0000 mg/m2 | Freq: Once | INTRAVENOUS | Status: AC
  Administered 2021-05-23: 120 mg via INTRAVENOUS
  Filled 2021-05-23: qty 20

## 2021-05-23 MED ORDER — DIPHENHYDRAMINE HCL 50 MG/ML IJ SOLN
50.0000 mg | Freq: Once | INTRAMUSCULAR | Status: AC
  Administered 2021-05-23: 50 mg via INTRAVENOUS
  Filled 2021-05-23: qty 1

## 2021-05-23 NOTE — Progress Notes (Signed)
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 Byrnett.] [id number] Carbo-taxol-AC;  JAN 2022-s/p Lumpectomy & ALND- [ypT1a (19m ) ypN1 (2/11 LN-positive)]; s/p radiation end of April 2022.  #Early June 2022-Xeloda 1000 mg per metered square [2000] 2 weeks on 1 week of 6-8 cycles; July 11th 2022- STARTING cycle #3-cut down the dose to 1500 mg BID[sec to HFS].   # JAN 2023- A. SKIN, RIGHT BREAST; PUNCH BIOPSY: [Dr.Byrnett] - INVASIVE CARCINOMA WITH DERMAL INFILTRATION AND LYMPHATIC INVASION. ER- weak POSISTIVE [10%]; PR/her2 NEG [[facility] 0]; JAN 20th, 2023- IMPRESSION: 1. Although the hypermetabolic right axillary node has resolved since the prior PET, there is a new hypermetabolic right low cervical/supraclavicular node, suspicious for nodal metastasis. 2. Decrease in right breast hypermetabolism with nonspecific overlying skin thickening in the setting of prior lumpectomy. 3. Low-level hypermetabolism within the T2 vertebral body, suspicious for isolated osseous metastasis. This could be confirmed with pre and postcontrast thoracic spine MRI. 4. Hypermetabolic thoracic nodes, slightly progressive. Given progression, nodal metastasis are slightly favored over reactive etiologies. 5. Incidental findings, including: Aortic Atherosclerosis (ICD10-I70.0). Tiny hiatal hernia. 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 No pathogenic variants identified on the [facility] Common Hereditary Cancers Panel + Skin Cancers Panel. The report date is 12/04/2019.  The Common Hereditary Cancers Panel + Skin Cancers Panel offered by [facility] includes sequencing and/or deletion duplication testing of the following 54 genes: APC*, ATM*, AXIN2, BAP1, BARD1, BMPR1A, BRCA1, BRCA2, BRIP1, CDH1, CDK4, CDKN2A (p14ARF), CDKN2A (p16INK4a), CHEK2, CTNNA1, DICER1*, EPCAM*, GREM1*, HOXB13, KIT, MEN1*, MITF*, MLH1*, MSH2*, MSH3*, MSH6*, MUTYH, NBN, NF1*, NTHL1, PALB2, PDGFRA, PMS2*, POLD1*, POLE, POT1, PTCH1, PTEN*, RAD50, RAD51C, RAD51D, RB1*, RNF43, SDHA*, SDHB, SDHC*, SDHD, SMAD4, SMARCA4, STK11, SUFU, TP53, TSC1*, TSC2, VHL.    05/07/2020 Cancer Staging   Staging form: Breast, AJCC 8th Edition - Clinical: Stage IIIB (cT2, cN1, cM0, G3, ER-, PR-, HER2-) - Signed by BCammie Sickle MD on 05/07/2020    04/18/2021 Cancer Staging   Staging form: Breast, AJCC 8th Edition - Pathologic: Stage IV (pT4d, pN3c, cM1, ER+, PR-, HER2-) - Signed by BCammie Sickle MD on 04/18/2021 Nuclear grade: G3       HISTORY OF PRESENTING ILLNESS: Accompanied by husband.  Walking independently.  Ashlee Cox 71y.o.  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  Patient's chemotherapy was held last week because  of neutropenia; rising LFTs.  Otherwise patient denies any nausea vomiting abdominal pain.  No fever no chills.  No worsening neuropathy.   Complains of fatigue.   Review of Systems  Constitutional:  Positive for malaise/fatigue. Negative for chills, diaphoresis, fever and weight loss.  HENT:  Negative for nosebleeds and sore throat.   Eyes:  Negative for double vision.  Respiratory:  Negative for cough, hemoptysis, sputum production and wheezing.   Cardiovascular:  Negative for chest pain, palpitations, orthopnea and leg swelling.  Gastrointestinal:  Negative for abdominal pain, blood in stool, constipation and melena.  Genitourinary:  Negative for dysuria, frequency and urgency.  Musculoskeletal:  Positive for back pain and neck pain. Negative for joint pain.  Neurological:  Positive for tingling. Negative for dizziness, focal weakness, weakness and headaches.  Endo/Heme/Allergies:  Does not bruise/bleed easily.  Psychiatric/Behavioral:  Negative for depression. The patient is not nervous/anxious and does not have insomnia.     MEDICAL HISTORY:  Past Medical History:  Diagnosis Date   Cancer [facility]) 2021   right breast   Cyst, breast    benign   Diabetes mellitus without complication ([place])    Family history of melanoma    Family history of ovarian cancer    GERD (gastroesophageal reflux disease)    H/O: rheumatic fever    History of colonoscopy 2010   done bc of bleeding,  normal , due back in 5 yrs ([place])   Hyperlipidemia    Hypertension    Menopause    at age 61   Personal history of chemotherapy 09/2019   RIGHT  INVASIVE MAMMARY CARCINOMA   Personal history of radiation therapy     SURGICAL HISTORY: Past Surgical History:  Procedure Laterality Date   APPENDECTOMY  2006   BREAST BIOPSY Right 09/22/2019   INVASIVE MAMMARY CARCINOMA   BREAST CYST ASPIRATION Left 1999    BREAST LUMPECTOMY Left 04/23/2020   surgery with NL and SN    BREAST LUMPECTOMY WITH NEEDLE LOCALIZATION Right 04/23/2020   Procedure: BREAST LUMPECTOMY WITH NEEDLE LOCALIZATION;  Surgeon: Robert Bellow, MD;  Location: [facility] ORS;  Service: General;  Laterality: Right;   BREAST LUMPECTOMY WITH SENTINEL LYMPH NODE BIOPSY Right 04/23/2020   Procedure: BREAST LUMPECTOMY WITH SENTINEL LYMPH NODE BX;  Surgeon: Robert Bellow, MD;  Location: [facility] ORS;  Service: General;  Laterality: Right;   CHOLECYSTECTOMY  2006   COLONOSCOPY     PORTACATH PLACEMENT Left 10/06/2019   Procedure: INSERTION PORT-A-CATH;  Surgeon: Robert Bellow, MD;  Location: [facility] ORS;  Service: General;  Laterality: Left;   SUBMUCOSAL TATTOO INJECTION Right 10/06/2019   Procedure: Right axillary TATTOO INJECTION;  Surgeon: Robert Bellow, MD;  Location: [facility] ORS;  Service: General;  Laterality: Right;   VAGINAL DELIVERY     x3    SOCIAL HISTORY: Social History   Socioeconomic History   Marital status: Married    Spouse name: Chrissie Noa   Number of children: 3   Years of education: Not on file   Highest education level: Not on file  Occupational History   Occupation: Glass blower/designer: [employer]  Tobacco Use   Smoking status: Former    Years: 1.00    Types: Cigarettes    Quit date: 12/26/2005    Years since quitting: 15.4   Smokeless tobacco: Never   Tobacco comments:    smoked for less than 1 years,  1 cig/day  Vaping Use   Vaping Use: Never used  Substance and Sexual Activity   Alcohol use: No   Drug use: No   Sexual activity: Never    Partners: Female  Other Topics Concern   Not on file  Social History Narrative   Widowed, March 2014; remarried.      [employer] ret[employer]; quit smoking 1ppw; no alcohol.       Social Determinants of Health   Financial Resource Strain: Low Risk    Difficulty of Paying Living Expenses: Not very hard  Food Insecurity: No Food Insecurity   Worried About Sales executive in the Last Year: Never true   Ran Out of Food in the Last Year: Never true  Transportation Needs: No Transportation Needs   Lack of Transportation (Medical): No   Lack of Transportation (Non-Medical): No  Physical Activity: Inactive   Days of Exercise per Week: 0 days   Minutes of Exercise per Session: 0 min  Stress: No Stress Concern Present   Feeling of Stress : Not at all  Social Connections: Socially Integrated   Frequency of Communication with Friends and Family: More than three times a week   Frequency of Social Gatherings with Friends and Family: More than three times a week   Attends Religious Services: More than 4 times per year   Active Member of [employer] or Organizations: Yes   Attends Music therapist: More than 4 times per year   Marital Status: Married  Human resources officer Violence: Not At Risk   Fear of Current or Ex-Partner: No   Emotionally Abused: No   Physically Abused: No   Sexually Abused: No    FAMILY HISTORY: Family History  Problem Relation Age of Onset   Cancer Mother 63       ovarian- died 4-5 years.    Heart disease Mother 51   Diabetes Mother    Stroke Father 47   Diabetes Father    Diabetes Sister    Melanoma Sister        survived   Breast cancer Neg Hx     ALLERGIES:  has No Known Allergies.  MEDICATIONS:  Current Outpatient Medications  Medication Sig Dispense Refill   acetaminophen (TYLENOL) 500 MG tablet Take 500 mg by mouth every 8 (eight) hours as needed for moderate pain.     diphenhydrAMINE HCl, Sleep, (ZZZQUIL PO) Take 1 tablet by mouth at bedtime as needed.     gabapentin (NEURONTIN) 100 MG capsule TAKE 2 CAPSULES BY MOUTH AT BEDTIME 60 capsule 0   glipiZIDE (GLUCOTROL XL) 10 MG 24 hr tablet Take 1 tablet (10 mg total) by mouth daily with breakfast. 90 tablet 1   lidocaine-prilocaine (EMLA) cream Apply 1 application topically as needed. Apply to port and cover with saran wrap 1-2 hours prior to port access 30 g 1    losartan-hydrochlorothiazide (HYZAAR) 50-12.5 MG tablet Take 1 tablet by mouth once daily 90 tablet 0   lovastatin (MEVACOR) 40 MG tablet TAKE 1 TABLET BY MOUTH AT BEDTIME 90 tablet 0   metFORMIN (GLUCOPHAGE) 1000 MG tablet TAKE 2 TABLETS BY MOUTH ONCE DAILY WITH  BREAKFAST 180 tablet 0   Multiple Vitamin (MULTIVITAMIN) tablet Take 1 tablet by mouth daily.     omeprazole (PRILOSEC) 20 MG capsule Take 1 capsule by mouth once daily 90 capsule 0   potassium chloride SA (KLOR-CON M) 20 MEQ tablet Take 1 tablet (20 mEq total) by mouth 2 (two) times daily. 60 tablet 0   No current facility-administered medications for this  visit.      Marland Kitchen  PHYSICAL EXAMINATION: ECOG PERFORMANCE STATUS: 0 - Asymptomatic  Vitals:   05/23/21 0848  BP: 137/77  Pulse: 95  Temp: 98.8 F (37.1 C)  SpO2: 98%   Filed Weights   05/23/21 0848  Weight: 185 lb 12.8 oz (84.3 kg)    Physical Exam HENT:     Head: Normocephalic and atraumatic.     Mouth/Throat:     Pharynx: No oropharyngeal exudate.  Eyes:     Pupils: Pupils are equal, round, and reactive to light.  Cardiovascular:     Rate and Rhythm: Normal rate and regular rhythm.  Pulmonary:     Effort: Pulmonary effort is normal. No respiratory distress.     Breath sounds: Normal breath sounds. No wheezing.  Abdominal:     General: Bowel sounds are normal. There is no distension.     Palpations: Abdomen is soft. There is no mass.     Tenderness: There is no abdominal tenderness. There is no guarding or rebound.  Musculoskeletal:        General: No tenderness. Normal range of motion.     Cervical back: Normal range of motion and neck supple.  Skin:    General: Skin is warm.  Neurological:     Mental Status: She is alert and oriented to person, place, and time.  Psychiatric:        Mood and Affect: Affect normal.     LABORATORY DATA:  I have reviewed the data as listed Lab Results  Component Value Date   WBC 3.0 (L) 05/23/2021   HGB 10.9 (L)  05/23/2021   HCT 34.6 (L) 05/23/2021   MCV 85.0 05/23/2021   PLT 238 05/23/2021   Recent Labs    05/09/21 0759 05/16/21 0848 05/23/21 0830  NA 135 134* 136  K 3.3* 3.0* 4.6  CL 102 101 101  CO2 23 23 25   GLUCOSE 160* 224* 170*  BUN 19 9 11   CREATININE 0.79 0.63 0.70  CALCIUM 9.1 8.5* 9.8  GFRNONAA >60 >60 >60  PROT 7.7 7.4 7.5  ALBUMIN 3.9 3.7 3.6  AST 105* 133* 85*  ALT 88* 164* 75*  ALKPHOS 240* 428* 499*  BILITOT 0.3 1.2 0.8    RADIOGRAPHIC STUDIES: I have personally reviewed the radiological images as listed and agreed with the findings in the report. No results found.  ASSESSMENT & PLAN:   Carcinoma of upper-inner quadrant of right breast in female, estrogen receptor negative ([place]) #Recurrent right breast cancer-s/p breast skin biopsy-week ER positive /PR HER2 negative- recurrent- stage IV;Apr 15, 2021- PET scan shows hypermetabolic thoracic lymph nodes; T2 vertebral body concerning for metastatic disease; lower level hypermetabolic some cervical/supraclavicular lymph node/right breast hypermetabolic lymph at the level of previous surgery. NGS -FEB 2023- CPS =15%. Currently on Keytruda- taxol [2/13];   # Proceed with Chemo Taxol today [will reduce the dose of Taxol to 60 mg per metered square given rising LFTs/neutropenia-see below]; Labs today reviewed;  acceptable for treatment today.   # Abnormal LFTs-grade 1- likely sec to taxol-we will decrease the dose of Taxol to 60 mg per metered square. [2/27]  # T2 thoracic metastatic disease [confirmed MRI Jan 2023]-discussed regarding radiation patient wants to hold off for now.  No significant pain. Ordered Zometa treatment; discuss.   # PN-1-2 sec to taxol-on Neurontin 200 qhs-stable  # DM/poorly controlled- PBF- 166; continue metformin 2000 mg q AM [Dr.Tullo]; INCREASE to glipizide 10 mg XL. Continue follow up with  PCP. [FBG- 240; PP- 160]; reviewed the log BGContinue dietary restrictions. Awaiting evaluation with  Dr.Tullo this week.   # Hypoklaemia: K- 4.6; ok to take Kdur 20 meq/day.   # Mediport: Functioning.    # DISPOSITION: # chemo today- taxol # 1 week- labs- cbc/cmp;taxol # follow up 2 weeks-- MD; labs- cbc/cmp; Keytruda-chemo Dr.B    All questions were answered. The patient/family knows to call the clinic with any problems, questions or concerns.   Cammie Sickle, MD 05/23/2021 3:23 PM

## 2021-05-23 NOTE — Assessment & Plan Note (Addendum)
#  Recurrent right breast cancer-s/p breast skin biopsy-week ER positive /PR HER2 negative- recurrent- stage IV;Apr 15, 2021- PET scan shows hypermetabolic thoracic lymph nodes; T2 vertebral body concerning for metastatic disease; lower level hypermetabolic some cervical/supraclavicular lymph node/right breast hypermetabolic lymph at the level of previous surgery. NGS -FEB 2023- CPS =15%. Currently on Keytruda- taxol [2/13];   # Proceed with Chemo Taxol today [will reduce the dose of Taxol to 60 mg per metered square given rising LFTs/neutropenia-see below]; Labs today reviewed;  acceptable for treatment today.   # Abnormal LFTs-grade 1- likely sec to taxol-we will decrease the dose of Taxol to 60 mg per metered square. [2/27]  # T2 thoracic metastatic disease [confirmed MRI Jan 2023]-discussed regarding radiation patient wants to hold off for now.  No significant pain. Ordered Zometa treatment; discuss.   # PN-1-2 sec to taxol-on Neurontin 200 qhs-stable  # DM/poorly controlled- PBF- 166; continue metformin 2000 mg q AM [Dr.Tullo]; INCREASE to glipizide 10 mg XL. Continue follow up with  PCP. [FBG- 240; PP- 160]; reviewed the log BGContinue dietary restrictions. Awaiting evaluation with Dr.Tullo this week.   # Hypoklaemia: K- 4.6; ok to take Kdur 20 meq/day.   # Mediport: Functioning.    # DISPOSITION: # chemo today- taxol # 1 week- labs- cbc/cmp;taxol # follow up 2 weeks-- MD; labs- cbc/cmp; Keytruda-chemo Dr.B

## 2021-05-23 NOTE — Progress Notes (Signed)
MD has reviewed all labs. Per Dr. Rogue Bussing, ok to go ahead with treatment today.

## 2021-05-24 DIAGNOSIS — I10 Essential (primary) hypertension: Secondary | ICD-10-CM

## 2021-05-24 DIAGNOSIS — C50211 Malignant neoplasm of upper-inner quadrant of right female breast: Secondary | ICD-10-CM | POA: Diagnosis not present

## 2021-05-24 DIAGNOSIS — Z171 Estrogen receptor negative status [ER-]: Secondary | ICD-10-CM

## 2021-05-24 DIAGNOSIS — E785 Hyperlipidemia, unspecified: Secondary | ICD-10-CM

## 2021-05-24 DIAGNOSIS — E113559 Type 2 diabetes mellitus with stable proliferative diabetic retinopathy, unspecified eye: Secondary | ICD-10-CM

## 2021-05-25 ENCOUNTER — Ambulatory Visit (INDEPENDENT_AMBULATORY_CARE_PROVIDER_SITE_OTHER): Payer: PPO | Admitting: Pharmacist

## 2021-05-25 ENCOUNTER — Other Ambulatory Visit: Payer: Self-pay

## 2021-05-25 DIAGNOSIS — I10 Essential (primary) hypertension: Secondary | ICD-10-CM

## 2021-05-25 DIAGNOSIS — E785 Hyperlipidemia, unspecified: Secondary | ICD-10-CM

## 2021-05-25 DIAGNOSIS — E113559 Type 2 diabetes mellitus with stable proliferative diabetic retinopathy, unspecified eye: Secondary | ICD-10-CM

## 2021-05-25 MED ORDER — OZEMPIC (0.25 OR 0.5 MG/DOSE) 2 MG/1.5ML ~~LOC~~ SOPN: 0.5000 mg | PEN_INJECTOR | SUBCUTANEOUS | 11 refills | Status: AC

## 2021-05-25 MED ORDER — FREESTYLE LIBRE 3 SENSOR MISC: 1.0000 | 3 refills | Status: AC

## 2021-05-25 NOTE — Patient Instructions (Addendum)
[place],  ? ?Keep up the great work! ? ?Start Ozempic 0.25 mg weekly for 4 weeks, then increase to 0.5 mg weekly. This medication may cause stomach upset, queasiness, or constipation, especially when first starting. This generally improves over time. Call our office if these symptoms occur and worsen, or if you have severe symptoms such as vomiting, diarrhea, or stomach pain.  ? ?Please call me if you start to have low blood sugars (anything <80) and we can reduce your glipizide. I'll call you in 2 weeks to check on your glucose readings.  ? ?Let me know if you have any issues with the [place].  ? ?Bring me a copy of your proof of income (for you and your husband) and we can submit the application for Ozempic assistance to [employer].  ? ?Take care! ? ?Catie Darnelle Maffucci, PharmD ? ?Visit Information ? ?Following are the goals we discussed today:  ?Patient Goals/Self-Care Activities ?patient will:  ?- check glucose twice daily, document, and provide at future appointments ?collaborate with provider on medication access solutions ?  ?   ?  ?  ?Plan: Telephone follow up appointment with care management team member scheduled for:  2 weeks ?  ?Catie Darnelle Maffucci, PharmD, BCACP, CPP ?Clinical Pharmacist ?Therapist, music at [employer] ?[phone] ? ? ?Please call the care guide team at [phone] if you need to cancel or reschedule your appointment.  ? ?Print copy of patient instructions, educational materials, and care plan provided in person.  ? ? ?

## 2021-05-25 NOTE — Chronic Care Management (AMB) (Signed)
? ?Chronic Care Management ?CCM Pharmacy Note ? ?05/25/2021 ?Name:  Ashlee Cox MRN:  [id number] DOB:  [birth year] ? ?Summary: ?- Educated on CGM today. Libre 3 started ? ?Recommendations/Changes made from today's visit: ?- Start Ozempic 0.25 mg weekly for 4 weeks, then increase to 0.5 mg weekly thereafter. Completing patient assistance. ? ?Subjective: ?Ashlee Cox is an 71 y.o. year old female who is a primary patient of Tullo, Aris Everts, MD.  The CCM team was consulted for assistance with disease management and care coordination needs.   ? ?Engaged with patient face to face for follow up visit for pharmacy case management and/or care coordination services. Her husband was also present.  ? ?Objective: ? ?Medications Reviewed Today   ? ? Reviewed by De Hollingshead, RPH-CPP (Pharmacist) on 05/25/21 at [phone]  Med List Status: <None>  ? ?Medication Order Taking? Sig Documenting Provider Last Dose Status Informant  ?acetaminophen (TYLENOL) 500 MG tablet [id number] Yes Take 500 mg by mouth every 8 (eight) hours as needed for moderate pain. [provider] Taking Active   ?diphenhydrAMINE HCl, Sleep, (ZZZQUIL PO) [id number] Yes Take 1 tablet by mouth at bedtime as needed. [provider] Taking Active   ?gabapentin (NEURONTIN) 100 MG capsule [id number] Yes TAKE 2 CAPSULES BY MOUTH AT BEDTIME Cammie Sickle, MD Taking Active   ?glipiZIDE (GLUCOTROL XL) 10 MG 24 hr tablet [id number] Yes Take 1 tablet (10 mg total) by mouth daily with breakfast. Cammie Sickle, MD Taking Active   ?lidocaine-prilocaine (EMLA) cream [id number] Yes Apply 1 application topically as needed. Apply to port and cover with saran wrap 1-2 hours prior to port access Cammie Sickle, MD Taking Active   ?losartan-hydrochlorothiazide (HYZAAR) 50-12.5 MG tablet [id number] Yes Take 1 tablet by mouth once daily [name], MD Taking Active   ?lovastatin (MEVACOR) 40 MG tablet [id number] Yes TAKE 1 TABLET BY  MOUTH AT BEDTIME [name], MD Taking Active   ?metFORMIN (GLUCOPHAGE) 1000 MG tablet [id number] Yes TAKE 2 TABLETS BY MOUTH ONCE DAILY WITH  BREAKFAST [name], MD Taking Active   ?         ?Med Note Hubert Azure D   Wed May 04, 2021  9:49 AM) Reports taking 1 tab in morning and 1 tab at bedtime  ?Multiple Vitamin (MULTIVITAMIN) tablet [id number] Yes Take 1 tablet by mouth daily. [provider] Taking Active Self  ?omeprazole (PRILOSEC) 20 MG capsule [id number] Yes Take 1 capsule by mouth once daily [name], MD Taking Active   ?potassium chloride SA (KLOR-CON M) 20 MEQ tablet [id number] Yes Take 1 tablet (20 mEq total) by mouth 2 (two) times daily. Cammie Sickle, MD Taking Active   ? ?  ?  ? ?  ? ? ?Pertinent Labs:   ?Lab Results  ?Component Value Date  ? HGBA1C 9.2 (A) 04/20/2021  ? ?Lab Results  ?Component Value Date  ? CHOL 173 04/24/2019  ? HDL 51.10 04/24/2019  ? [place] 88 04/24/2019  ? LDLDIRECT 82.0 06/13/2018  ? TRIG 166.0 (H) 04/24/2019  ? CHOLHDL 3 04/24/2019  ? ?Lab Results  ?Component Value Date  ? CREATININE 0.70 05/23/2021  ? BUN 11 05/23/2021  ? NA 136 05/23/2021  ? K 4.6 05/23/2021  ? CL 101 05/23/2021  ? CO2 25 05/23/2021  ? ? ?SDOH:  (Social Determinants of Health) assessments and interventions performed:  ?SDOH Interventions   ? ?Flowsheet Row Most Recent Value  ?SDOH Interventions   ?  Financial Strain Interventions Other (Comment)  [manufacturer assistance]  ? ?  ? ? ?[place] ? ?Review of patient past medical history, allergies, medications, health status, including review of consultants reports, laboratory and other test data, was performed as part of comprehensive evaluation and provision of chronic care management services.  ? ?Care Plan : Medication Management  ?Updates made by De Hollingshead, RPH-CPP since 05/25/2021 12:00 AM  ?  ? ?Problem: Diabetes, Breast Cancer, Hypertension, Hyperlipidemia   ?  ? ?Long-Range Goal: Disease Progression  Prevention   ?Start Date: 04/27/2021  ?Recent Progress: On track  ?Priority: High  ?Note:   ?Current Barriers:  ?Unable to achieve control of diabetes  ? ?Pharmacist Clinical Goal(s):  ?patient will achieve control of diabetes as evidenced by A1c  through collaboration with PharmD and provider.  ? ?Interventions: ?1:1 collaboration with [name], MD regarding development and update of comprehensive plan of care as evidenced by provider attestation and co-signature ?Inter-disciplinary care team collaboration (see longitudinal plan of care) ?Comprehensive medication review performed; medication list updated in electronic medical record ? ?Health Maintenance   ?Yearly diabetic eye exam: up to date ?Yearly diabetic foot exam: up to date ?Urine microalbumin: up to date ?Yearly influenza vaccination: due ?Td/Tdap vaccination: up to date ?Pneumonia vaccination: due ?COVID vaccinations: due ?Shingrix vaccinations: due ?Colonoscopy: up to date ?Bone density scan: up to date ?Mammogram: up to date ? ?Diabetes:  ?Uncontrolled; current treatment: metformin 500 mg twice daily, glipizide XL 10 mg QAM;  ?Current glucose readings: fastings 150-170s; post prandials into 300s ?Educated on CGM today. Provided sample of Libre 3. Patient placed today.  ?Counseled on GLP1 agonists, including mechanism of action, side effects, and benefits. No personal or family history of medullary thyroid cancer, personal history of pancreatitis or gallbladder disease. Counseled on potential side effects of nausea, stomach upset, queasiness, constipation, and that these generally improve over time. Advised to contact our office with more severe symptoms, including nausea, diarrhea, stomach pain. Patient verbalized understanding. ?Start Ozempic 0.25 mg weekly for 4 weeks, then increase to 0.5 mg weekly. Call in 2 weeks to determine if glipizide dose needs to be adjusted.  ?Concern regarding copay long term. Discussed patient assistance program.  Patient believes she will qualify. Completed PAP application today, patient will bring back proof of income. Will collaborate w/ CPhT to submit and follow up ? ?Hypertension:   ?Controlled; current treatment:losartan/HCTZ 50/12.5 mg daily;  ?Previously recommended to continue current regimen at this time.  ? ?Hyperlipidemia:   ?Uncontrolled, though goal <70 may not be necessary given long term prognosis; current treatment: lovastatin 40 mg daily ;  ?Previously recommended to continue current regimen at this time ? ?GERD: ?Controlled; current regimen: omeprazole 20 mg daily ?Previously recommended to continue current regimen at this time ? ?Neuropathic Pain: ?Controlled; current regimen: gabapentin 200 mg daily ?Previously recommended to continue current regimen at this time ? ?Patient Goals/Self-Care Activities ?patient will:  ?- check glucose twice daily, document, and provide at future appointments ?collaborate with provider on medication access solutions ? ?  ?  ? ?Plan: Telephone follow up appointment with care management team member scheduled for:  2 weeks ? ?Catie Darnelle Maffucci, PharmD, BCACP, CPP ?Clinical Pharmacist ?Therapist, music at [employer] ?[phone] ? ? ? ? ? ?

## 2021-05-26 ENCOUNTER — Telehealth: Payer: Self-pay | Admitting: Pharmacy Technician

## 2021-05-26 DIAGNOSIS — Z596 Low income: Secondary | ICD-10-CM

## 2021-05-26 NOTE — Progress Notes (Signed)
[facility])  ?                                          [facility] Quality Pharmacy Team ?  ? ?05/26/2021 ? ?Mirinda A Baldinger ?December 26, 1950 ?[id number] ? ?                                    Medication Assistance Referral ? ?Referral From: [place] Catie T.  ? ?Medication/Company: [employer] ?Patient application portion: Interoffice Mailed ?Provider application portion: Interoffice Mailed to Dr. Deborra Medina ?Provider address/fax verified via: Office website ? ? ? ?Nuria Phebus P. Beverlyn Mcginness, CPhT ?[place]  ?[phone] ?  ?

## 2021-05-30 ENCOUNTER — Ambulatory Visit: Payer: Self-pay | Admitting: Pharmacist

## 2021-05-30 ENCOUNTER — Other Ambulatory Visit: Payer: Self-pay

## 2021-05-30 ENCOUNTER — Inpatient Hospital Stay: Payer: PPO | Attending: Internal Medicine

## 2021-05-30 ENCOUNTER — Inpatient Hospital Stay: Payer: PPO

## 2021-05-30 ENCOUNTER — Telehealth: Payer: Self-pay | Admitting: Pharmacy Technician

## 2021-05-30 DIAGNOSIS — Z171 Estrogen receptor negative status [ER-]: Secondary | ICD-10-CM | POA: Insufficient documentation

## 2021-05-30 DIAGNOSIS — E876 Hypokalemia: Secondary | ICD-10-CM | POA: Insufficient documentation

## 2021-05-30 DIAGNOSIS — D4861 Neoplasm of uncertain behavior of right breast: Secondary | ICD-10-CM

## 2021-05-30 DIAGNOSIS — R7989 Other specified abnormal findings of blood chemistry: Secondary | ICD-10-CM | POA: Insufficient documentation

## 2021-05-30 DIAGNOSIS — C50211 Malignant neoplasm of upper-inner quadrant of right female breast: Secondary | ICD-10-CM | POA: Insufficient documentation

## 2021-05-30 DIAGNOSIS — Z5112 Encounter for antineoplastic immunotherapy: Secondary | ICD-10-CM | POA: Insufficient documentation

## 2021-05-30 DIAGNOSIS — Z5111 Encounter for antineoplastic chemotherapy: Secondary | ICD-10-CM | POA: Insufficient documentation

## 2021-05-30 DIAGNOSIS — E1165 Type 2 diabetes mellitus with hyperglycemia: Secondary | ICD-10-CM | POA: Diagnosis not present

## 2021-05-30 DIAGNOSIS — Z596 Low income: Secondary | ICD-10-CM

## 2021-05-30 LAB — CBC WITH DIFFERENTIAL/PLATELET
Abs Immature Granulocytes: 0.02 10*3/uL (ref 0.00–0.07)
Basophils Absolute: 0.1 10*3/uL (ref 0.0–0.1)
Basophils Relative: 1 %
Eosinophils Absolute: 0.1 10*3/uL (ref 0.0–0.5)
Eosinophils Relative: 2 %
HCT: 33.3 % — ABNORMAL LOW (ref 36.0–46.0)
Hemoglobin: 10.8 g/dL — ABNORMAL LOW (ref 12.0–15.0)
Immature Granulocytes: 0 %
Lymphocytes Relative: 15 %
Lymphs Abs: 0.8 10*3/uL (ref 0.7–4.0)
MCH: 27 pg (ref 26.0–34.0)
MCHC: 32.4 g/dL (ref 30.0–36.0)
MCV: 83.3 fL (ref 80.0–100.0)
Monocytes Absolute: 0.4 10*3/uL (ref 0.1–1.0)
Monocytes Relative: 9 %
Neutro Abs: 3.8 10*3/uL (ref 1.7–7.7)
Neutrophils Relative %: 73 %
Platelets: 215 10*3/uL (ref 150–400)
RBC: 4 MIL/uL (ref 3.87–5.11)
RDW: 16.4 % — ABNORMAL HIGH (ref 11.5–15.5)
WBC: 5.2 10*3/uL (ref 4.0–10.5)
nRBC: 0 % (ref 0.0–0.2)

## 2021-05-30 LAB — COMPREHENSIVE METABOLIC PANEL
ALT: 119 U/L — ABNORMAL HIGH (ref 0–44)
AST: 187 U/L — ABNORMAL HIGH (ref 15–41)
Albumin: 3.9 g/dL (ref 3.5–5.0)
Alkaline Phosphatase: 533 U/L — ABNORMAL HIGH (ref 38–126)
Anion gap: 11 (ref 5–15)
BUN: 19 mg/dL (ref 8–23)
CO2: 23 mmol/L (ref 22–32)
Calcium: 9.4 mg/dL (ref 8.9–10.3)
Chloride: 101 mmol/L (ref 98–111)
Creatinine, Ser: 0.72 mg/dL (ref 0.44–1.00)
GFR, Estimated: >60 mL/min (ref >60)
Glucose, Bld: 166 mg/dL — ABNORMAL HIGH (ref 70–99)
Potassium: 3.8 mmol/L (ref 3.5–5.1)
Sodium: 135 mmol/L (ref 135–145)
Total Bilirubin: 1.4 mg/dL — ABNORMAL HIGH (ref 0.3–1.2)
Total Protein: 7.9 g/dL (ref 6.5–8.1)

## 2021-05-30 MED ORDER — HEPARIN SOD (PORK) LOCK FLUSH 100 UNIT/ML IV SOLN
500.0000 [IU] | Freq: Once | INTRAVENOUS | Status: AC | PRN
  Administered 2021-05-30: 500 [IU]
  Filled 2021-05-30: qty 5

## 2021-05-30 MED ORDER — SODIUM CHLORIDE 0.9 % IV SOLN
10.0000 mg | Freq: Once | INTRAVENOUS | Status: AC
  Administered 2021-05-30: 10 mg via INTRAVENOUS
  Filled 2021-05-30: qty 10

## 2021-05-30 MED ORDER — DIPHENHYDRAMINE HCL 50 MG/ML IJ SOLN
50.0000 mg | Freq: Once | INTRAMUSCULAR | Status: AC
  Administered 2021-05-30: 50 mg via INTRAVENOUS
  Filled 2021-05-30: qty 1

## 2021-05-30 MED ORDER — SODIUM CHLORIDE 0.9 % IV SOLN
Freq: Once | INTRAVENOUS | Status: AC
  Filled 2021-05-30: qty 250

## 2021-05-30 MED ORDER — SODIUM CHLORIDE 0.9% FLUSH
10.0000 mL | INTRAVENOUS | Status: DC | PRN
  Administered 2021-05-30: 10 mL
  Filled 2021-05-30: qty 10

## 2021-05-30 MED ORDER — FAMOTIDINE IN NACL 20-0.9 MG/50ML-% IV SOLN
20.0000 mg | Freq: Once | INTRAVENOUS | Status: AC
  Administered 2021-05-30: 20 mg via INTRAVENOUS
  Filled 2021-05-30: qty 50

## 2021-05-30 MED ORDER — SODIUM CHLORIDE 0.9 % IV SOLN
60.0000 mg/m2 | Freq: Once | INTRAVENOUS | Status: AC
  Administered 2021-05-30: 120 mg via INTRAVENOUS
  Filled 2021-05-30: qty 20

## 2021-05-30 NOTE — Chronic Care Management (AMB) (Signed)
?  Chronic Care Management  ? ?Note ? ?05/30/2021 ?Name: Ashlee Cox MRN: [id number] DOB: 02/23/1951 ? ? ? ?Closing pharmacy CCM case at this time. Follow up this week with RN CM. Patient has clinic contact information for future questions or concerns.  ? ?Catie Darnelle Maffucci, PharmD, [place], CPP ?Clinical Pharmacist ?Therapist, music at [employer] ?[phone] ? ?

## 2021-05-30 NOTE — Patient Instructions (Signed)
[facility] CANCER CTR AT [place]  Discharge Instructions: Thank you for choosing [facility] to provide your oncology and hematology care.  If you have a lab appointment with the [facility], please go directly to the [facility] and check in at the registration area.  Wear comfortable clothing and clothing appropriate for easy access to any Portacath or PICC line.   We strive to give you quality time with your provider. You may need to reschedule your appointment if you arrive late (15 or more minutes).  Arriving late affects you and other patients whose appointments are after yours.  Also, if you miss three or more appointments without notifying the office, you may be dismissed from the clinic at the providers discretion.      For prescription refill requests, have your pharmacy contact our office and allow 72 hours for refills to be completed.    Today you received the following chemotherapy and/or immunotherapy agents TAXOL      To help prevent nausea and vomiting after your treatment, we encourage you to take your nausea medication as directed.  BELOW ARE SYMPTOMS THAT SHOULD BE REPORTED IMMEDIATELY: *FEVER GREATER THAN 100.4 F (38 C) OR HIGHER *CHILLS OR SWEATING *NAUSEA AND VOMITING THAT IS NOT CONTROLLED WITH YOUR NAUSEA MEDICATION *UNUSUAL SHORTNESS OF BREATH *UNUSUAL BRUISING OR BLEEDING *URINARY PROBLEMS (pain or burning when urinating, or frequent urination) *BOWEL PROBLEMS (unusual diarrhea, constipation, pain near the anus) TENDERNESS IN MOUTH AND THROAT WITH OR WITHOUT PRESENCE OF ULCERS (sore throat, sores in mouth, or a toothache) UNUSUAL RASH, SWELLING OR PAIN  UNUSUAL VAGINAL DISCHARGE OR ITCHING   Items with * indicate a potential emergency and should be followed up as soon as possible or go to the Emergency Department if any problems should occur.  Please show the CHEMOTHERAPY ALERT CARD or IMMUNOTHERAPY ALERT CARD at check-in to the  Emergency Department and triage nurse.  Should you have questions after your visit or need to cancel or reschedule your appointment, please contact [facility] CANCER [place] AT [place]  [phone] and follow the prompts.  Office hours are 8:00 a.m. to 4:30 p.m. Monday - Friday. Please note that voicemails left after 4:00 p.m. may not be returned until the following business day.  We are closed weekends and major holidays. You have access to a nurse at all times for urgent questions. Please call the main number to the clinic [phone] and follow the prompts.  For any non-urgent questions, you may also contact your provider using MyChart. We now offer e-Visits for anyone 71 and older to request care online for non-urgent symptoms. For details visit [email or web address].   Also download the MyChart app! Go to the app store, search "MyChart", open the app, select [place], and log in with your MyChart username and password.  Due to Covid, a mask is required upon entering the hospital/clinic. If you do not have a mask, one will be given to you upon arrival. For doctor visits, patients may have 1 support person aged 32 or older with them. For treatment visits, patients cannot have anyone with them due to current Covid guidelines and our immunocompromised population.   Paclitaxel injection What is this medication? PACLITAXEL (PAK li TAX el) is a chemotherapy drug. It targets fast dividing cells, like cancer cells, and causes these cells to die. This medicine is used to treat ovarian cancer, breast cancer, lung cancer, Kaposi's sarcoma, and other cancers. This medicine may be used for other purposes; ask  your health care provider or pharmacist if you have questions. COMMON BRAND NAME(S): Onxol, Taxol What should I tell my care team before I take this medication? They need to know if you have any of these conditions: history of irregular heartbeat liver disease low blood counts, like low  white cell, platelet, or red cell counts lung or breathing disease, like asthma tingling of the fingers or toes, or other nerve disorder an unusual or allergic reaction to paclitaxel, alcohol, polyoxyethylated castor oil, other chemotherapy, other medicines, foods, dyes, or preservatives pregnant or trying to get pregnant breast-feeding How should I use this medication? This drug is given as an infusion into a vein. It is administered in a hospital or clinic by a specially trained health care professional. Talk to your pediatrician regarding the use of this medicine in children. Special care may be needed. Overdosage: If you think you have taken too much of this medicine contact a poison control center or emergency room at once. NOTE: This medicine is only for you. Do not share this medicine with others. What if I miss a dose? It is important not to miss your dose. Call your doctor or health care professional if you are unable to keep an appointment. What may interact with this medication? Do not take this medicine with any of the following medications: live virus vaccines This medicine may also interact with the following medications: antiviral medicines for hepatitis, HIV or AIDS certain antibiotics like erythromycin and clarithromycin certain medicines for fungal infections like ketoconazole and itraconazole certain medicines for seizures like carbamazepine, phenobarbital, phenytoin gemfibrozil nefazodone rifampin St. John's wort This list may not describe all possible interactions. Give your health care provider a list of all the medicines, herbs, non-prescription drugs, or dietary supplements you use. Also tell them if you smoke, drink alcohol, or use illegal drugs. Some items may interact with your medicine. What should I watch for while using this medication? Your condition will be monitored carefully while you are receiving this medicine. You will need important blood work done  while you are taking this medicine. This medicine can cause serious allergic reactions. To reduce your risk you will need to take other medicine(s) before treatment with this medicine. If you experience allergic reactions like skin rash, itching or hives, swelling of the face, lips, or tongue, tell your doctor or health care professional right away. In some cases, you may be given additional medicines to help with side effects. Follow all directions for their use. This drug may make you feel generally unwell. This is not uncommon, as chemotherapy can affect healthy cells as well as cancer cells. Report any side effects. Continue your course of treatment even though you feel ill unless your doctor tells you to stop. Call your doctor or health care professional for advice if you get a fever, chills or sore throat, or other symptoms of a cold or flu. Do not treat yourself. This drug decreases your body's ability to fight infections. Try to avoid being around people who are sick. This medicine may increase your risk to bruise or bleed. Call your doctor or health care professional if you notice any unusual bleeding. Be careful brushing and flossing your teeth or using a toothpick because you may get an infection or bleed more easily. If you have any dental work done, tell your dentist you are receiving this medicine. Avoid taking products that contain aspirin, acetaminophen, ibuprofen, naproxen, or ketoprofen unless instructed by your doctor. These medicines may hide a  fever. Do not become pregnant while taking this medicine. Women should inform their doctor if they wish to become pregnant or think they might be pregnant. There is a potential for serious side effects to an unborn child. Talk to your health care professional or pharmacist for more information. Do not breast-feed an infant while taking this medicine. Men are advised not to father a child while receiving this medicine. This product may contain  alcohol. Ask your pharmacist or healthcare provider if this medicine contains alcohol. Be sure to tell all healthcare providers you are taking this medicine. Certain medicines, like metronidazole and disulfiram, can cause an unpleasant reaction when taken with alcohol. The reaction includes flushing, headache, nausea, vomiting, sweating, and increased thirst. The reaction can last from 30 minutes to several hours. What side effects may I notice from receiving this medication? Side effects that you should report to your doctor or health care professional as soon as possible: allergic reactions like skin rash, itching or hives, swelling of the face, lips, or tongue breathing problems changes in vision fast, irregular heartbeat high or low blood pressure mouth sores pain, tingling, numbness in the hands or feet signs of decreased platelets or bleeding - bruising, pinpoint red spots on the skin, black, tarry stools, blood in the urine signs of decreased red blood cells - unusually weak or tired, feeling faint or lightheaded, falls signs of infection - fever or chills, cough, sore throat, pain or difficulty passing urine signs and symptoms of liver injury like dark yellow or brown urine; general ill feeling or flu-like symptoms; light-colored stools; loss of appetite; nausea; right upper belly pain; unusually weak or tired; yellowing of the eyes or skin swelling of the ankles, feet, hands unusually slow heartbeat Side effects that usually do not require medical attention (report to your doctor or health care professional if they continue or are bothersome): diarrhea hair loss loss of appetite muscle or joint pain nausea, vomiting pain, redness, or irritation at site where injected tiredness This list may not describe all possible side effects. Call your doctor for medical advice about side effects. You may report side effects to FDA at 1-800-FDA-1088. Where should I keep my medication? This drug  is given in a hospital or clinic and will not be stored at home. NOTE: This sheet is a summary. It may not cover all possible information. If you have questions about this medicine, talk to your doctor, pharmacist, or health care provider.  2022 Elsevier/Gold Standard (2020-11-30 00:00:00)

## 2021-05-30 NOTE — Progress Notes (Signed)
[place] [facility])  ?                                          [facility] Quality Pharmacy Team ?  ? ?05/30/2021 ? ?Ashlee Cox ?1950/11/20 ?[id number] ? ?Received both patient and provider portion(s) of patient assistance application(s) for Ozempic. Faxed  completed application and required documents into [employer]. ? ? ? ?[name] P. Alira Fretwell, CPhT ?[place]  ?[phone]  ?

## 2021-05-30 NOTE — Patient Instructions (Signed)
[facility] CANCER CTR AT [place]  Discharge Instructions: Thank you for choosing [facility] to provide your oncology and hematology care.  If you have a lab appointment with the [facility], please go directly to the [facility] and check in at the registration area.  Wear comfortable clothing and clothing appropriate for easy access to any Portacath or PICC line.   We strive to give you quality time with your provider. You may need to reschedule your appointment if you arrive late (15 or more minutes).  Arriving late affects you and other patients whose appointments are after yours.  Also, if you miss three or more appointments without notifying the office, you may be dismissed from the clinic at the providers discretion.      For prescription refill requests, have your pharmacy contact our office and allow 72 hours for refills to be completed.    Today you received the following chemotherapy and/or immunotherapy agents TAXOL      To help prevent nausea and vomiting after your treatment, we encourage you to take your nausea medication as directed.  BELOW ARE SYMPTOMS THAT SHOULD BE REPORTED IMMEDIATELY: *FEVER GREATER THAN 100.4 F (38 C) OR HIGHER *CHILLS OR SWEATING *NAUSEA AND VOMITING THAT IS NOT CONTROLLED WITH YOUR NAUSEA MEDICATION *UNUSUAL SHORTNESS OF BREATH *UNUSUAL BRUISING OR BLEEDING *URINARY PROBLEMS (pain or burning when urinating, or frequent urination) *BOWEL PROBLEMS (unusual diarrhea, constipation, pain near the anus) TENDERNESS IN MOUTH AND THROAT WITH OR WITHOUT PRESENCE OF ULCERS (sore throat, sores in mouth, or a toothache) UNUSUAL RASH, SWELLING OR PAIN  UNUSUAL VAGINAL DISCHARGE OR ITCHING   Items with * indicate a potential emergency and should be followed up as soon as possible or go to the Emergency Department if any problems should occur.  Please show the CHEMOTHERAPY ALERT CARD or IMMUNOTHERAPY ALERT CARD at check-in to the  Emergency Department and triage nurse.  Should you have questions after your visit or need to cancel or reschedule your appointment, please contact [facility] CANCER [place] AT [place]  [phone] and follow the prompts.  Office hours are 8:00 a.m. to 4:30 p.m. Monday - Friday. Please note that voicemails left after 4:00 p.m. may not be returned until the following business day.  We are closed weekends and major holidays. You have access to a nurse at all times for urgent questions. Please call the main number to the clinic [phone] and follow the prompts.  For any non-urgent questions, you may also contact your provider using MyChart. We now offer e-Visits for anyone 77 and older to request care online for non-urgent symptoms. For details visit [email or web address].   Also download the MyChart app! Go to the app store, search "MyChart", open the app, select [place], and log in with your MyChart username and password.  Due to Covid, a mask is required upon entering the hospital/clinic. If you do not have a mask, one will be given to you upon arrival. For doctor visits, patients may have 1 support person aged 55 or older with them. For treatment visits, patients cannot have anyone with them due to current Covid guidelines and our immunocompromised population.   Paclitaxel injection What is this medication? PACLITAXEL (PAK li TAX el) is a chemotherapy drug. It targets fast dividing cells, like cancer cells, and causes these cells to die. This medicine is used to treat ovarian cancer, breast cancer, lung cancer, Kaposi's sarcoma, and other cancers. This medicine may be used for other purposes; ask  your health care provider or pharmacist if you have questions. COMMON BRAND NAME(S): Onxol, Taxol What should I tell my care team before I take this medication? They need to know if you have any of these conditions: history of irregular heartbeat liver disease low blood counts, like low  white cell, platelet, or red cell counts lung or breathing disease, like asthma tingling of the fingers or toes, or other nerve disorder an unusual or allergic reaction to paclitaxel, alcohol, polyoxyethylated castor oil, other chemotherapy, other medicines, foods, dyes, or preservatives pregnant or trying to get pregnant breast-feeding How should I use this medication? This drug is given as an infusion into a vein. It is administered in a hospital or clinic by a specially trained health care professional. Talk to your pediatrician regarding the use of this medicine in children. Special care may be needed. Overdosage: If you think you have taken too much of this medicine contact a poison control center or emergency room at once. NOTE: This medicine is only for you. Do not share this medicine with others. What if I miss a dose? It is important not to miss your dose. Call your doctor or health care professional if you are unable to keep an appointment. What may interact with this medication? Do not take this medicine with any of the following medications: live virus vaccines This medicine may also interact with the following medications: antiviral medicines for hepatitis, HIV or AIDS certain antibiotics like erythromycin and clarithromycin certain medicines for fungal infections like ketoconazole and itraconazole certain medicines for seizures like carbamazepine, phenobarbital, phenytoin gemfibrozil nefazodone rifampin St. John's wort This list may not describe all possible interactions. Give your health care provider a list of all the medicines, herbs, non-prescription drugs, or dietary supplements you use. Also tell them if you smoke, drink alcohol, or use illegal drugs. Some items may interact with your medicine. What should I watch for while using this medication? Your condition will be monitored carefully while you are receiving this medicine. You will need important blood work done  while you are taking this medicine. This medicine can cause serious allergic reactions. To reduce your risk you will need to take other medicine(s) before treatment with this medicine. If you experience allergic reactions like skin rash, itching or hives, swelling of the face, lips, or tongue, tell your doctor or health care professional right away. In some cases, you may be given additional medicines to help with side effects. Follow all directions for their use. This drug may make you feel generally unwell. This is not uncommon, as chemotherapy can affect healthy cells as well as cancer cells. Report any side effects. Continue your course of treatment even though you feel ill unless your doctor tells you to stop. Call your doctor or health care professional for advice if you get a fever, chills or sore throat, or other symptoms of a cold or flu. Do not treat yourself. This drug decreases your body's ability to fight infections. Try to avoid being around people who are sick. This medicine may increase your risk to bruise or bleed. Call your doctor or health care professional if you notice any unusual bleeding. Be careful brushing and flossing your teeth or using a toothpick because you may get an infection or bleed more easily. If you have any dental work done, tell your dentist you are receiving this medicine. Avoid taking products that contain aspirin, acetaminophen, ibuprofen, naproxen, or ketoprofen unless instructed by your doctor. These medicines may hide a  fever. Do not become pregnant while taking this medicine. Women should inform their doctor if they wish to become pregnant or think they might be pregnant. There is a potential for serious side effects to an unborn child. Talk to your health care professional or pharmacist for more information. Do not breast-feed an infant while taking this medicine. Men are advised not to father a child while receiving this medicine. This product may contain  alcohol. Ask your pharmacist or healthcare provider if this medicine contains alcohol. Be sure to tell all healthcare providers you are taking this medicine. Certain medicines, like metronidazole and disulfiram, can cause an unpleasant reaction when taken with alcohol. The reaction includes flushing, headache, nausea, vomiting, sweating, and increased thirst. The reaction can last from 30 minutes to several hours. What side effects may I notice from receiving this medication? Side effects that you should report to your doctor or health care professional as soon as possible: allergic reactions like skin rash, itching or hives, swelling of the face, lips, or tongue breathing problems changes in vision fast, irregular heartbeat high or low blood pressure mouth sores pain, tingling, numbness in the hands or feet signs of decreased platelets or bleeding - bruising, pinpoint red spots on the skin, black, tarry stools, blood in the urine signs of decreased red blood cells - unusually weak or tired, feeling faint or lightheaded, falls signs of infection - fever or chills, cough, sore throat, pain or difficulty passing urine signs and symptoms of liver injury like dark yellow or brown urine; general ill feeling or flu-like symptoms; light-colored stools; loss of appetite; nausea; right upper belly pain; unusually weak or tired; yellowing of the eyes or skin swelling of the ankles, feet, hands unusually slow heartbeat Side effects that usually do not require medical attention (report to your doctor or health care professional if they continue or are bothersome): diarrhea hair loss loss of appetite muscle or joint pain nausea, vomiting pain, redness, or irritation at site where injected tiredness This list may not describe all possible side effects. Call your doctor for medical advice about side effects. You may report side effects to FDA at 1-800-FDA-1088. Where should I keep my medication? This drug  is given in a hospital or clinic and will not be stored at home. NOTE: This sheet is a summary. It may not cover all possible information. If you have questions about this medicine, talk to your doctor, pharmacist, or health care provider.  2022 Elsevier/Gold Standard (2020-11-30 00:00:00)

## 2021-05-30 NOTE — Progress Notes (Signed)
Dr Rogue Bussing reviewed this am labs AST 187, ALT 119. Ok to proceed with Taxol ?

## 2021-06-01 ENCOUNTER — Ambulatory Visit: Payer: PPO | Admitting: *Deleted

## 2021-06-01 DIAGNOSIS — C50211 Malignant neoplasm of upper-inner quadrant of right female breast: Secondary | ICD-10-CM

## 2021-06-01 DIAGNOSIS — N6310 Unspecified lump in the right breast, unspecified quadrant: Secondary | ICD-10-CM

## 2021-06-01 DIAGNOSIS — E113559 Type 2 diabetes mellitus with stable proliferative diabetic retinopathy, unspecified eye: Secondary | ICD-10-CM

## 2021-06-01 DIAGNOSIS — Z171 Estrogen receptor negative status [ER-]: Secondary | ICD-10-CM

## 2021-06-01 NOTE — Chronic Care Management (AMB) (Cosign Needed)
Chronic Care Management   CCM RN Visit Note  06/01/2021 Name: Ashlee Cox MRN: [id number] DOB: [birth year]  Subjective: Ashlee Cox is a 71 y.o. year old female who is a primary care patient of [name], MD. The care management team was consulted for assistance with disease management and care coordination needs.    Engaged with patient by telephone for follow up visit in response to provider referral for case management and/or care coordination services.   Consent to Services:  The patient was given information about Chronic Care Management services, agreed to services, and gave verbal consent prior to initiation of services.  Please see initial visit note for detailed documentation.   Patient agreed to services and verbal consent obtained.   Assessment: Review of patient past medical history, allergies, medications, health status, including review of consultants reports, laboratory and other test data, was performed as part of comprehensive evaluation and provision of chronic care management services.   SDOH (Social Determinants of Health) assessments and interventions performed:    CCM Care Plan  No Known Allergies  Outpatient Encounter Medications as of 06/01/2021  Medication Sig Note   acetaminophen (TYLENOL) 500 MG tablet Take 500 mg by mouth every 8 (eight) hours as needed for moderate pain.    Continuous Blood Gluc Sensor (FREESTYLE LIBRE 3 SENSOR) MISC Apply 1 each topically every 14 (fourteen) days. Place 1 sensor on the skin every 14 days. Use to check glucose continuously    diphenhydrAMINE HCl, Sleep, (ZZZQUIL PO) Take 1 tablet by mouth at bedtime as needed.    gabapentin (NEURONTIN) 100 MG capsule TAKE 2 CAPSULES BY MOUTH AT BEDTIME    glipiZIDE (GLUCOTROL XL) 10 MG 24 hr tablet Take 1 tablet (10 mg total) by mouth daily with breakfast.    lidocaine-prilocaine (EMLA) cream Apply 1 application topically as needed. Apply to port and cover with saran wrap 1-2  hours prior to port access    losartan-hydrochlorothiazide (HYZAAR) 50-12.5 MG tablet Take 1 tablet by mouth once daily    lovastatin (MEVACOR) 40 MG tablet TAKE 1 TABLET BY MOUTH AT BEDTIME    metFORMIN (GLUCOPHAGE) 1000 MG tablet TAKE 2 TABLETS BY MOUTH ONCE DAILY WITH  BREAKFAST 05/04/2021: Reports taking 1 tab in morning and 1 tab at bedtime   Multiple Vitamin (MULTIVITAMIN) tablet Take 1 tablet by mouth daily.    Multiple Vitamins-Minerals (MULTI-VITAMIN GUMMIES) CHEW Chew 1 tablet by mouth 1 day or 1 dose.    omeprazole (PRILOSEC) 20 MG capsule Take 1 capsule by mouth once daily    potassium chloride SA (KLOR-CON M) 20 MEQ tablet Take 1 tablet (20 mEq total) by mouth 2 (two) times daily. 05/25/2021: 20 mEq once daily   Semaglutide,0.25 or 0.'5MG'$ /DOS, (OZEMPIC, 0.25 OR 0.5 MG/DOSE,) 2 MG/1.5ML SOPN Inject 0.5 mg into the skin once a week.    No facility-administered encounter medications on file as of 06/01/2021.    Patient Active Problem List   Diagnosis Date Noted   Acquired trigger finger 05/11/2020   Drug-induced polyneuropathy ([place]) 05/07/2020   Type 2 diabetes mellitus with stable proliferative retinopathy, without long-term current use of insulin ([place]) 05/07/2020   Genetic testing 12/05/2019   Family history of ovarian cancer    Family history of melanoma    Carcinoma of upper-inner quadrant of right breast in female, estrogen receptor negative ([place]) 10/07/2019   Neoplasm of uncertain behavior of upper outer quadrant of female breast, right 09/25/2019   Osteopenia after menopause 03/23/2018  Constipation 08/04/2017   Elevated LFTs 08/05/2016   Psoriasis of scalp 12/10/2015   Diabetic retinopathy ([place]) 12/08/2015   Encounter for preventive health examination 07/26/2012   Abnormal EKG 01/12/2011   Hypertension 12/27/2010   Hyperlipidemia with target LDL less than 100 12/27/2010   H/O: rheumatic fever    History of colonoscopy     Conditions to be addressed/monitored:DMII and  BREAST CANCER  Care Plan : [place] [facility] (Adult)  Updates made by [name], RN since 06/01/2021 12:00 AM     Problem: KNOWLEDGE DEFICIT RELATED TO SELF CARE MANAGEMENT OF CHRONIC CONDITIONS   Priority: Medium     Goal: PATIENT WILL WORK WITH CCM TEAM TO INCREASEKNOWLEDGE OF CHRONIC CONDITIONS   Start Date: 05/04/2021  Expected End Date: 05/04/2021  Priority: Medium  Note:   Current Barriers:  Chronic Disease Management support and education needs related to DMII and BREAST CANCER ;  Patient with recurrent breast cancer post right lumpectomy.  Has porta cath and restarting chemotherapy this week.  In great spirits and has family support.  History of diabetes.  Fasting blood sugar was 247 with recent fasting ranges of 170-200.  Was told chemo would increase blood sugars due to steroids.  BP 132/89 with recent ranges of 100-130/50-80's  3/8--continues to feel ok; awaiting next chemo plan.  Does have elevated liver enzymes the doctors are evaluating.  Still in good spirits.  Blood sugars have been elevated.  Fasting blood sugar this morning was 160 with recent ranges of 111-160's, discussed chemo elevating blood sugars.  Discussed CCM Pharm D Catie changing positions and Pharmacy tech Lamar assisting with medication assistance application  RNCM Clinical Goal(s):  Patient will verbalize understanding of plan for management of DMII and BREAST CANCER as evidenced by better glucose ranges and tolerating chemotherapy demonstrate improved health management independence as evidenced by decreasing Hgb A1C by 0.3 points and completing course of chemotherapy        through collaboration with RN Care manager, provider, and care team.   Interventions: 1:1 collaboration with primary care provider regarding development and update of comprehensive plan of care as evidenced by provider attestation and co-signature Inter-disciplinary care team collaboration (see longitudinal plan of  care) Evaluation of current treatment plan related to  self management and patient's adherence to plan as established by provider   Diabetes:  (Status: Goal on track: NO.) Long Term Goal   Lab Results  Component Value Date   HGBA1C 9.2 (A) 04/20/2021   @ Assessed patient's understanding of A1c goal: <7% Provided education to patient about basic DM disease process; Reviewed medications with patient and discussed importance of medication adherence;        Reviewed prescribed diet with patient low carbohydrate; Counseled on importance of regular laboratory monitoring as prescribed;        Discussed plans with patient for ongoing care management follow up and provided patient with direct contact information for care management team;      Provided patient with written educational materials related to hypo and hyperglycemia and importance of correct treatment;       Advised patient, providing education and rationale, to check cbg twice daily and when you have symptoms of low or high blood sugar and record        Screening for signs and symptoms of depression related to chronic disease state;        Discussed healthier food and drink options Discussed what elevates blood sugars  Oncology:  (  Goal on track: NO.) Long Term Goal  Assessment of understanding of oncology diagnosis:  Assessed patient understanding of cancer diagnosis and recommended treatment plan Reviewed upcoming provider appointments and treatment appointments Assessed available transportation to appointments and treatments. Has consistent/reliable transportation: Yes Assessed support system. Has consistent/reliable family or other support: Yes Nutrition assessment performed  Patient Goals/Self-Care Activities: Take medications as prescribed   Attend all scheduled provider appointments Call provider office for new concerns or questions  check blood sugar at prescribed times: twice daily and when you have symptoms of low or  high blood sugar enter blood sugar readings and medication or insulin into daily log take the blood sugar log to all doctor visits set a realistic goal keep feet up while sitting wash and dry feet carefully every day       Plan:The care management team will reach out to the patient again over the next 45 days.  Hubert Azure RN, MSN RN Care Management Coordinator [place] [phone] [email or web address]

## 2021-06-01 NOTE — Patient Instructions (Addendum)
Visit Information ? ?Thank you for taking time to visit with me today. Please don't hesitate to contact me if I can be of assistance to you before our next scheduled telephone appointment. ? ?Following are the goals we discussed today:  ?Take medications as prescribed   ?Attend all scheduled provider appointments ?Call provider office for new concerns or questions  ?check blood sugar at prescribed times: twice daily and when you have symptoms of low or high blood sugar ?enter blood sugar readings and medication or insulin into daily log ?take the blood sugar log to all doctor visits ?set a realistic goal ?keep feet up while sitting ?wash and dry feet carefully every day ? ?Our next appointment is by telephone on 4/10 at 1000 ? ?Please call the care guide team at [phone] if you need to cancel or reschedule your appointment.  ? ?If you are experiencing a Mental Health or [place] or need someone to talk to, please call the Suicide and Crisis Lifeline: 988 ?call the [place] National Suicide Prevention Lifeline: (680)500-6307 or TTY: 734-597-8888 TTY 747-293-1996) to talk to a trained counselor ?call 1-800-273-TALK (toll free, 24 hour hotline) ?call 911  ? ?Patient verbalizes understanding of instructions and care plan provided today and agrees to view in [place]. Active MyChart status confirmed with patient.   ? ?Hubert Azure RN, MSN ?RN Care Management Coordinator ?[place] ?[phone] ?[email or web address] ? ?

## 2021-06-06 ENCOUNTER — Inpatient Hospital Stay: Payer: PPO

## 2021-06-06 ENCOUNTER — Encounter: Payer: Self-pay | Admitting: Internal Medicine

## 2021-06-06 ENCOUNTER — Inpatient Hospital Stay: Payer: PPO | Admitting: Internal Medicine

## 2021-06-06 ENCOUNTER — Telehealth: Payer: Self-pay | Admitting: *Deleted

## 2021-06-06 ENCOUNTER — Other Ambulatory Visit: Payer: Self-pay

## 2021-06-06 DIAGNOSIS — C50211 Malignant neoplasm of upper-inner quadrant of right female breast: Secondary | ICD-10-CM

## 2021-06-06 DIAGNOSIS — Z171 Estrogen receptor negative status [ER-]: Secondary | ICD-10-CM

## 2021-06-06 DIAGNOSIS — Z5112 Encounter for antineoplastic immunotherapy: Secondary | ICD-10-CM | POA: Diagnosis not present

## 2021-06-06 DIAGNOSIS — D4861 Neoplasm of uncertain behavior of right breast: Secondary | ICD-10-CM

## 2021-06-06 LAB — CBC WITH DIFFERENTIAL/PLATELET
Abs Immature Granulocytes: 0.02 10*3/uL (ref 0.00–0.07)
Basophils Absolute: 0 10*3/uL (ref 0.0–0.1)
Basophils Relative: 1 %
Eosinophils Absolute: 0.1 10*3/uL (ref 0.0–0.5)
Eosinophils Relative: 3 %
HCT: 31.6 % — ABNORMAL LOW (ref 36.0–46.0)
Hemoglobin: 10.3 g/dL — ABNORMAL LOW (ref 12.0–15.0)
Immature Granulocytes: 1 %
Lymphocytes Relative: 27 %
Lymphs Abs: 1 10*3/uL (ref 0.7–4.0)
MCH: 27.4 pg (ref 26.0–34.0)
MCHC: 32.6 g/dL (ref 30.0–36.0)
MCV: 84 fL (ref 80.0–100.0)
Monocytes Absolute: 0.2 10*3/uL (ref 0.1–1.0)
Monocytes Relative: 7 %
Neutro Abs: 2.2 10*3/uL (ref 1.7–7.7)
Neutrophils Relative %: 61 %
Platelets: 206 10*3/uL (ref 150–400)
RBC: 3.76 MIL/uL — ABNORMAL LOW (ref 3.87–5.11)
RDW: 16.4 % — ABNORMAL HIGH (ref 11.5–15.5)
WBC: 3.5 10*3/uL — ABNORMAL LOW (ref 4.0–10.5)
nRBC: 0 % (ref 0.0–0.2)

## 2021-06-06 LAB — COMPREHENSIVE METABOLIC PANEL
ALT: 56 U/L — ABNORMAL HIGH (ref 0–44)
AST: 59 U/L — ABNORMAL HIGH (ref 15–41)
Albumin: 3.6 g/dL (ref 3.5–5.0)
Alkaline Phosphatase: 265 U/L — ABNORMAL HIGH (ref 38–126)
Anion gap: 7 (ref 5–15)
BUN: 15 mg/dL (ref 8–23)
CO2: 24 mmol/L (ref 22–32)
Calcium: 9.3 mg/dL (ref 8.9–10.3)
Chloride: 103 mmol/L (ref 98–111)
Creatinine, Ser: 0.67 mg/dL (ref 0.44–1.00)
GFR, Estimated: >60 mL/min (ref >60)
Glucose, Bld: 158 mg/dL — ABNORMAL HIGH (ref 70–99)
Potassium: 3.8 mmol/L (ref 3.5–5.1)
Sodium: 134 mmol/L — ABNORMAL LOW (ref 135–145)
Total Bilirubin: 0.9 mg/dL (ref 0.3–1.2)
Total Protein: 7.5 g/dL (ref 6.5–8.1)

## 2021-06-06 MED ORDER — SODIUM CHLORIDE 0.9 % IV SOLN
200.0000 mg | Freq: Once | INTRAVENOUS | Status: AC
  Administered 2021-06-06: 200 mg via INTRAVENOUS
  Filled 2021-06-06: qty 8

## 2021-06-06 MED ORDER — HEPARIN SOD (PORK) LOCK FLUSH 100 UNIT/ML IV SOLN
500.0000 [IU] | Freq: Once | INTRAVENOUS | Status: AC | PRN
  Administered 2021-06-06: 500 [IU]
  Filled 2021-06-06: qty 5

## 2021-06-06 MED ORDER — SODIUM CHLORIDE 0.9% FLUSH
10.0000 mL | INTRAVENOUS | Status: DC | PRN
  Administered 2021-06-06: 10 mL
  Filled 2021-06-06: qty 10

## 2021-06-06 MED ORDER — SODIUM CHLORIDE 0.9 % IV SOLN
Freq: Once | INTRAVENOUS | Status: AC
  Filled 2021-06-06: qty 250

## 2021-06-06 NOTE — Progress Notes (Signed)
Patient states no concerns at the moment. 

## 2021-06-06 NOTE — Progress Notes (Signed)
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 Byrnett.] [id number] Carbo-taxol-AC;  JAN 2022-s/p Lumpectomy & ALND- [ypT1a (57m ) ypN1 (2/11 LN-positive)]; s/p radiation end of April 2022.  #Early June 2022-Xeloda 1000 mg per metered square [2000] 2 weeks on 1 week of 6-8 cycles; July 11th 2022- STARTING cycle #3-cut down the dose to 1500 mg BID[sec to HFS].   # JAN 2023- A. SKIN, RIGHT BREAST; PUNCH BIOPSY: [Dr.Byrnett] - INVASIVE CARCINOMA WITH DERMAL INFILTRATION AND LYMPHATIC INVASION. ER- weak POSISTIVE [10%]; PR/her2 NEG [Ashlee Cox 0]; JAN 20th, 2023- IMPRESSION: 1. Although the hypermetabolic right axillary node has resolved since the prior PET, there is a new hypermetabolic right low cervical/supraclavicular node, suspicious for nodal metastasis. 2. Decrease in right breast hypermetabolism with nonspecific overlying skin thickening in the setting of prior lumpectomy. 3. Low-level hypermetabolism within the T2 vertebral body, suspicious for isolated osseous metastasis. This could be confirmed with pre and postcontrast thoracic spine MRI. 4. Hypermetabolic thoracic nodes, slightly progressive. Given progression, nodal metastasis are slightly favored over reactive etiologies. 5. Incidental findings, including: Aortic Atherosclerosis (ICD10-I70.0). Tiny hiatal hernia. 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 No pathogenic variants identified on the [facility] Common Hereditary Cancers Panel + Skin Cancers Panel. The report date is 12/04/2019.  The Common Hereditary Cancers Panel + Skin Cancers Panel offered by [facility] includes sequencing and/or deletion duplication testing of the following 54 genes: APC*, ATM*, AXIN2, BAP1, BARD1, BMPR1A, BRCA1, BRCA2, BRIP1, CDH1, CDK4, CDKN2A (p14ARF), CDKN2A (p16INK4a), CHEK2, CTNNA1, DICER1*, EPCAM*, GREM1*, HOXB13, KIT, MEN1*, MITF*, MLH1*, MSH2*, MSH3*, MSH6*, MUTYH, NBN, NF1*, NTHL1, PALB2, PDGFRA, PMS2*, POLD1*, POLE, POT1, PTCH1, PTEN*, RAD50, RAD51C, RAD51D, RB1*, RNF43, SDHA*, SDHB, SDHC*, SDHD, SMAD4, SMARCA4, STK11, SUFU, TP53, TSC1*, TSC2, VHL.    05/07/2020 Cancer Staging   Staging form: Breast, AJCC 8th Edition - Clinical: Stage IIIB (cT2, cN1, cM0, G3, ER-, PR-, HER2-) - Signed by BCammie Sickle MD on 05/07/2020    04/18/2021 Cancer Staging   Staging form: Breast, AJCC 8th Edition - Pathologic: Stage IV (pT4d, pN3c, cM1, ER+, PR-, HER2-) - Signed by BCammie Sickle MD on 04/18/2021 Nuclear grade: G3       HISTORY OF PRESENTING ILLNESS: Accompanied by husband.  Walking independently.  CKyelle UrbasBumgarner 71y.o.  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  In the  interim evaluated with PCP has a continuous glucose monitoring system  at this time.  Patient denies any nausea vomiting.  No abdominal pain. Otherwise patient denies any nausea vomiting abdominal pain.  No fever no chills.  No worsening neuropathy.   Complains of fatigue.   Review of Systems  Constitutional:  Positive for malaise/fatigue. Negative for chills, diaphoresis, fever and weight loss.  HENT:  Negative for nosebleeds and sore throat.   Eyes:  Negative for double vision.  Respiratory:  Negative for cough, hemoptysis, sputum production and wheezing.   Cardiovascular:  Negative for chest pain, palpitations, orthopnea and leg swelling.  Gastrointestinal:  Negative for abdominal pain, blood in stool, constipation and melena.  Genitourinary:  Negative for dysuria, frequency and urgency.  Musculoskeletal:  Positive for back pain and neck pain. Negative for joint pain.  Neurological:  Positive for tingling. Negative for dizziness, focal weakness, weakness and headaches.  Endo/Heme/Allergies:  Does not bruise/bleed easily.  Psychiatric/Behavioral:  Negative for depression. The patient is not nervous/anxious and does not have insomnia.     MEDICAL HISTORY:  Past Medical History:  Diagnosis Date   Cancer [facility]) 2021   right breast   Cyst, breast    benign   Diabetes mellitus without complication ([place])    Family history of melanoma    Family history of ovarian cancer    GERD (gastroesophageal reflux disease)    H/O: rheumatic fever    History of colonoscopy 2010   done bc of bleeding,  normal , due back in 5 yrs (Ashlee Cox)   Hyperlipidemia    Hypertension    Menopause    at age 52   Personal history of chemotherapy 09/2019   RIGHT  INVASIVE MAMMARY CARCINOMA   Personal history of radiation therapy     SURGICAL HISTORY: Past Surgical History:  Procedure Laterality Date   APPENDECTOMY  2006   BREAST BIOPSY Right 09/22/2019   INVASIVE MAMMARY CARCINOMA   BREAST CYST  ASPIRATION Left 1999   BREAST LUMPECTOMY Left 04/23/2020   surgery with NL and SN    BREAST LUMPECTOMY WITH NEEDLE LOCALIZATION Right 04/23/2020   Procedure: BREAST LUMPECTOMY WITH NEEDLE LOCALIZATION;  Surgeon: Ashlee Bellow, MD;  Location: [facility] ORS;  Service: General;  Laterality: Right;   BREAST LUMPECTOMY WITH SENTINEL LYMPH NODE BIOPSY Right 04/23/2020   Procedure: BREAST LUMPECTOMY WITH SENTINEL LYMPH NODE BX;  Surgeon: Ashlee Bellow, MD;  Location: [facility] ORS;  Service: General;  Laterality: Right;   CHOLECYSTECTOMY  2006   COLONOSCOPY     PORTACATH PLACEMENT Left 10/06/2019   Procedure: INSERTION PORT-A-CATH;  Surgeon: Ashlee Bellow, MD;  Location: [facility] ORS;  Service: General;  Laterality: Left;   SUBMUCOSAL TATTOO INJECTION Right 10/06/2019   Procedure: Right axillary TATTOO INJECTION;  Surgeon: Ashlee Bellow, MD;  Location: [facility] ORS;  Service: General;  Laterality: Right;   VAGINAL DELIVERY     x3    SOCIAL HISTORY: Social History   Socioeconomic History   Marital status: Married    Spouse name: Chrissie Noa   Number of children: 3   Years of education: Not on file   Highest education level: Not on file  Occupational History   Occupation: Glass blower/designer: [employer]  Tobacco Use   Smoking status: Former    Years: 1.00    Types: Cigarettes    Quit date: 12/26/2005    Years since quitting: 15.4   Smokeless tobacco: Never   Tobacco comments:    smoked for less than 1 years,  1 cig/day  Vaping  Use   Vaping Use: Never used  Substance and Sexual Activity   Alcohol use: No   Drug use: No   Sexual activity: Never    Partners: Female  Other Topics Concern   Not on file  Social History Narrative   Widowed, March 2014; remarried.      [employer] ret[employer]; quit smoking 1ppw; no alcohol.       Social Determinants of Health   Financial Resource Strain: Medium Risk   Difficulty of Paying Living Expenses: Somewhat hard  Food Insecurity: No Food Insecurity    Worried About Charity fundraiser in the Last Year: Never true   Ran Out of Food in the Last Year: Never true  Transportation Needs: No Transportation Needs   Lack of Transportation (Medical): No   Lack of Transportation (Non-Medical): No  Physical Activity: Inactive   Days of Exercise per Week: 0 days   Minutes of Exercise per Session: 0 min  Stress: No Stress Concern Present   Feeling of Stress : Not at all  Social Connections: Socially Integrated   Frequency of Communication with Friends and Family: More than three times a week   Frequency of Social Gatherings with Friends and Family: More than three times a week   Attends Religious Services: More than 4 times per year   Active Member of [employer] or Organizations: Yes   Attends Music therapist: More than 4 times per year   Marital Status: Married  Human resources officer Violence: Not At Risk   Fear of Current or Ex-Partner: No   Emotionally Abused: No   Physically Abused: No   Sexually Abused: No    FAMILY HISTORY: Family History  Problem Relation Age of Onset   Cancer Mother 65       ovarian- died 4-5 years.    Heart disease Mother 38   Diabetes Mother    Stroke Father 35   Diabetes Father    Diabetes Sister    Melanoma Sister        survived   Breast cancer Neg Hx     ALLERGIES:  has No Known Allergies.  MEDICATIONS:  Current Outpatient Medications  Medication Sig Dispense Refill   acetaminophen (TYLENOL) 500 MG tablet Take 500 mg by mouth every 8 (eight) hours as needed for moderate pain.     Continuous Blood Gluc Sensor (FREESTYLE LIBRE 3 SENSOR) MISC Apply 1 each topically every 14 (fourteen) days. Place 1 sensor on the skin every 14 days. Use to check glucose continuously 6 each 3   diphenhydrAMINE HCl, Sleep, (ZZZQUIL PO) Take 1 tablet by mouth at bedtime as needed.     gabapentin (NEURONTIN) 100 MG capsule TAKE 2 CAPSULES BY MOUTH AT BEDTIME 60 capsule 0   glipiZIDE (GLUCOTROL XL) 10 MG 24 hr tablet  Take 1 tablet (10 mg total) by mouth daily with breakfast. 90 tablet 1   lidocaine-prilocaine (EMLA) cream Apply 1 application topically as needed. Apply to port and cover with saran wrap 1-2 hours prior to port access 30 g 1   losartan-hydrochlorothiazide (HYZAAR) 50-12.5 MG tablet Take 1 tablet by mouth once daily 90 tablet 0   lovastatin (MEVACOR) 40 MG tablet TAKE 1 TABLET BY MOUTH AT BEDTIME 90 tablet 0   metFORMIN (GLUCOPHAGE) 1000 MG tablet TAKE 2 TABLETS BY MOUTH ONCE DAILY WITH  BREAKFAST 180 tablet 0   Multiple Vitamin (MULTIVITAMIN) tablet Take 1 tablet by mouth daily.     Multiple Vitamins-Minerals (MULTI-VITAMIN GUMMIES) CHEW Chew  1 tablet by mouth 1 day or 1 dose.     omeprazole (PRILOSEC) 20 MG capsule Take 1 capsule by mouth once daily 90 capsule 0   potassium chloride SA (KLOR-CON M) 20 MEQ tablet Take 1 tablet (20 mEq total) by mouth 2 (two) times daily. 60 tablet 0   Semaglutide,0.25 or 0.5MG/DOS, (OZEMPIC, 0.25 OR 0.5 MG/DOSE,) 2 MG/1.5ML SOPN Inject 0.5 mg into the skin once a week. 1.5 mL 11   No current facility-administered medications for this visit.   Facility-Administered Medications Ordered in Other Visits  Medication Dose Route Frequency Provider Last Rate Last Admin   heparin lock flush 100 unit/mL  500 Units Intracatheter Once PRN Ashlee Sickle, MD       pembrolizumab [facility]) 200 mg in sodium chloride 0.9 % 50 mL chemo infusion  200 mg Intravenous Once Charlaine Dalton R, MD       sodium chloride flush (NS) 0.9 % injection 10 mL  10 mL Intracatheter PRN Ashlee Sickle, MD          .  PHYSICAL EXAMINATION: ECOG PERFORMANCE STATUS: 0 - Asymptomatic  Vitals:   06/06/21 0837  BP: 135/72  Pulse: 90  Resp: 19  Temp: (!) 96.3 F (35.7 C)  SpO2: 99%   Filed Weights   06/06/21 0837  Weight: 183 lb 6.4 oz (83.2 kg)    Physical Exam HENT:     Head: Normocephalic and atraumatic.     Mouth/Throat:     Pharynx: No oropharyngeal  exudate.  Eyes:     Pupils: Pupils are equal, round, and reactive to light.  Cardiovascular:     Rate and Rhythm: Normal rate and regular rhythm.  Pulmonary:     Effort: Pulmonary effort is normal. No respiratory distress.     Breath sounds: Normal breath sounds. No wheezing.  Abdominal:     General: Bowel sounds are normal. There is no distension.     Palpations: Abdomen is soft. There is no mass.     Tenderness: There is no abdominal tenderness. There is no guarding or rebound.  Musculoskeletal:        General: No tenderness. Normal range of motion.     Cervical back: Normal range of motion and neck supple.  Skin:    General: Skin is warm.  Neurological:     Mental Status: She is alert and oriented to person, place, and time.  Psychiatric:        Mood and Affect: Affect normal.     LABORATORY DATA:  I have reviewed the data as listed Lab Results  Component Value Date   WBC 3.5 (L) 06/06/2021   HGB 10.3 (L) 06/06/2021   HCT 31.6 (L) 06/06/2021   MCV 84.0 06/06/2021   PLT 206 06/06/2021   Recent Labs    05/23/21 0830 05/30/21 0820 06/06/21 0829  NA 136 135 134*  K 4.6 3.8 3.8  CL 101 101 103  CO2 25 23 24   GLUCOSE 170* 166* 158*  BUN 11 19 15   CREATININE 0.70 0.72 0.67  CALCIUM 9.8 9.4 9.3  GFRNONAA >60 >60 >60  PROT 7.5 7.9 7.5  ALBUMIN 3.6 3.9 3.6  AST 85* 187* 59*  ALT 75* 119* 56*  ALKPHOS 499* 533* 265*  BILITOT 0.8 1.4* 0.9    RADIOGRAPHIC STUDIES: I have personally reviewed the radiological images as listed and agreed with the findings in the report. No results found.  ASSESSMENT & PLAN:   Carcinoma of upper-inner quadrant  of right breast in female, estrogen receptor negative ([place]) #Recurrent right breast cancer-s/p breast skin biopsy-week ER positive /PR HER2 negative- recurrent- stage IV;Apr 15, 2021- PET scan shows hypermetabolic thoracic lymph nodes; T2 vertebral body concerning for metastatic disease; lower level hypermetabolic some  cervical/supraclavicular lymph node/right breast hypermetabolic lymph at the level of previous surgery. NGS -FEB 2023- Ashlee Cox =15%. Currently on Keytruda- taxol [2/13];   # Proceed with Chemo Taxol today [at redcued dose of Taxol to 60 mg per metered square given rising LFTs/neutropenia-see below]; Labs today reviewed;  acceptable for treatment today.  Discussed that we will plan to get an imaging in 2 to 3 months.  # Abnormal LFTs-grade 1- likely sec to taxol-we will decrease the dose of Taxol to 60 mg per metered square. [2/27]  # T2 thoracic metastatic disease [confirmed MRI Jan 2023]-discussed regarding radiation patient wants to hold off for now.  No significant pain. Ordered Zometa treatment; discuss.   # PN-1-2 sec to taxol-on Neurontin 200 qhs-stable  # DM/poorly controlled- PBF- 166; continue metformin 2000 mg q AM [Dr.Tullo]; INCREASE to glipizide 10 mg XL. Continue follow up with  PCP. [FBG- 240; PP- 160]; reviewed the log BGContinue dietary restrictions. On CGM.    # Hypoklaemia: K- 4.6; ok to take Kdur 20 meq/day.   # Mediport: Functioning.    Key-3week; Taxol-weekx3;1 w-off # DISPOSITION: # Keytruda ONLY today; no taxol # 1 week- labs- cbc/cmp;taxol # follow up 2 weeks-- MD; labs- cbc/cmp; taxol Dr.B    All questions were answered. The patient/family knows to call the clinic with any problems, questions or concerns.   Ashlee Sickle, MD 06/06/2021 9:42 AM

## 2021-06-06 NOTE — Assessment & Plan Note (Addendum)
#  Recurrent right breast cancer-s/p breast skin biopsy-week ER positive /PR HER2 negative- recurrent- stage IV;Apr 15, 2021- PET scan shows hypermetabolic thoracic lymph nodes; T2 vertebral body concerning for metastatic disease; lower level hypermetabolic some cervical/supraclavicular lymph node/right breast hypermetabolic lymph at the level of previous surgery. NGS -FEB 2023- CPS =15%. Currently on Keytruda- taxol [2/13];  ? ?# Proceed with Chemo Taxol today [at redcued dose of Taxol to 60 mg per metered square given rising LFTs/neutropenia-see below]; Labs today reviewed;  acceptable for treatment today.  Discussed that we will plan to get an imaging in 2 to 3 months. ? ?# Abnormal LFTs-grade 1- likely sec to taxol-we will decrease the dose of Taxol to 60 mg per metered square. [2/27] ? ?# T2 thoracic metastatic disease [confirmed MRI Jan 2023]-discussed regarding radiation patient wants to hold off for now.  No significant pain. Ordered Zometa treatment; discuss.  ? ?# PN-1-2 sec to taxol-on Neurontin 200 qhs-stable ? ?# DM/poorly controlled- PBF- 166; continue metformin 2000 mg q AM [Dr.Tullo]; INCREASE to glipizide 10 mg XL. Continue follow up with  PCP. [FBG- 240; PP- 160]; reviewed the log BGContinue dietary restrictions. On CGM.   ? ?# Hypoklaemia: K- 4.6; ok to take Kdur 20 meq/day.  ? ?# Mediport: Functioning.  ?  ?Key-3week; Taxol-weekx3;1 w-off ?# DISPOSITION: ?# Keytruda ONLY today; no taxol ?# 1 week- labs- cbc/cmp;taxol ?# follow up 2 weeks-- MD; labs- cbc/cmp; taxol Dr.B ? ? ?

## 2021-06-06 NOTE — Patient Instructions (Signed)
[facility] [place]  Discharge Instructions: °Thank you for choosing [facility] to provide your oncology and hematology care.  °If you have a lab appointment with the Cancer Center, please go directly to the Cancer Center and check in at the registration area. ° °Wear comfortable clothing and clothing appropriate for easy access to any Portacath or PICC line.  ° °We strive to give you quality time with your provider. You may need to reschedule your appointment if you arrive late (15 or more minutes).  Arriving late affects you and other patients whose appointments are after yours.  Also, if you miss three or more appointments without notifying the office, you may be dismissed from the clinic at the provider’s discretion.    °  °For prescription refill requests, have your pharmacy contact our office and allow 72 hours for refills to be completed.   ° °Today you received the following chemotherapy and/or immunotherapy agents Keytruda °    °  °To help prevent nausea and vomiting after your treatment, we encourage you to take your nausea medication as directed. ° °BELOW ARE SYMPTOMS THAT SHOULD BE REPORTED IMMEDIATELY: °*FEVER GREATER THAN 100.4 F (38 °C) OR HIGHER °*CHILLS OR SWEATING °*NAUSEA AND VOMITING THAT IS NOT CONTROLLED WITH YOUR NAUSEA MEDICATION °*UNUSUAL SHORTNESS OF BREATH °*UNUSUAL BRUISING OR BLEEDING °*URINARY PROBLEMS (pain or burning when urinating, or frequent urination) °*BOWEL PROBLEMS (unusual diarrhea, constipation, pain near the anus) °TENDERNESS IN MOUTH AND THROAT WITH OR WITHOUT PRESENCE OF ULCERS (sore throat, sores in mouth, or a toothache) °UNUSUAL RASH, SWELLING OR PAIN  °UNUSUAL VAGINAL DISCHARGE OR ITCHING  ° °Items with * indicate a potential emergency and should be followed up as soon as possible or go to the Emergency Department if any problems should occur. ° °Please show the CHEMOTHERAPY ALERT CARD or IMMUNOTHERAPY ALERT CARD at check-in to  the Emergency Department and triage nurse. ° °Should you have questions after your visit or need to cancel or reschedule your appointment, please contact [facility]  [phone] and follow the prompts.  Office hours are 8:00 a.m. to 4:30 p.m. Monday - Friday. Please note that voicemails left after 4:00 p.m. may not be returned until the following business day.  We are closed weekends and major holidays. You have access to a nurse at all times for urgent questions. Please call the main number to the clinic [phone] and follow the prompts. ° °For any non-urgent questions, you may also contact your provider using MyChart. We now offer e-Visits for anyone 18 and older to request care online for non-urgent symptoms. For details visit [email or web address]. °  °Also download the MyChart app! Go to the app store, search "MyChart", open the app, select [place], and log in with your MyChart username and password. ° °Due to Covid, a mask is required upon entering the hospital/clinic. If you do not have a mask, one will be given to you upon arrival. For doctor visits, patients may have 1 support person aged 18 or older with them. For treatment visits, patients cannot have anyone with them due to current Covid guidelines and our immunocompromised population.  °

## 2021-06-06 NOTE — Telephone Encounter (Signed)
Patient states that she left today without her next appointment please call to schedule. ?

## 2021-06-08 ENCOUNTER — Encounter: Payer: Self-pay | Admitting: Internal Medicine

## 2021-06-09 ENCOUNTER — Telehealth: Payer: PPO

## 2021-06-10 ENCOUNTER — Telehealth: Payer: Self-pay | Admitting: Pharmacy Technician

## 2021-06-10 DIAGNOSIS — Z596 Low income: Secondary | ICD-10-CM

## 2021-06-10 NOTE — Progress Notes (Signed)
[facility])  ?                                          [facility] Quality Pharmacy Team ?  ? ?06/10/2021 ? ?Ashlee Cox ?[birth year] ?[phone] ? ?Care coordination call placed to [employer] in regard to [place] application. ? ?Spoke to [place] who informs patient is APPROVED 06/10/21-03/26/22. She informs medication will be delivered to the provider's office in the next 42 days. ? ?[name] P. Kemaria Dedic, CPhT ?[place]  ?([phone] ? ? ? ?

## 2021-06-12 ENCOUNTER — Other Ambulatory Visit: Payer: Self-pay | Admitting: Internal Medicine

## 2021-06-12 DIAGNOSIS — G620 Drug-induced polyneuropathy: Secondary | ICD-10-CM

## 2021-06-13 ENCOUNTER — Encounter: Payer: Self-pay | Admitting: Internal Medicine

## 2021-06-13 ENCOUNTER — Inpatient Hospital Stay: Payer: PPO

## 2021-06-13 ENCOUNTER — Other Ambulatory Visit: Payer: Self-pay

## 2021-06-13 VITALS — BP 129/72 | HR 86 | Temp 98.4°F | Resp 16 | Wt 185.0 lb

## 2021-06-13 DIAGNOSIS — C50211 Malignant neoplasm of upper-inner quadrant of right female breast: Secondary | ICD-10-CM

## 2021-06-13 DIAGNOSIS — D4861 Neoplasm of uncertain behavior of right breast: Secondary | ICD-10-CM

## 2021-06-13 DIAGNOSIS — Z5112 Encounter for antineoplastic immunotherapy: Secondary | ICD-10-CM | POA: Diagnosis not present

## 2021-06-13 LAB — COMPREHENSIVE METABOLIC PANEL
ALT: 60 U/L — ABNORMAL HIGH (ref 0–44)
AST: 66 U/L — ABNORMAL HIGH (ref 15–41)
Albumin: 3.7 g/dL (ref 3.5–5.0)
Alkaline Phosphatase: 256 U/L — ABNORMAL HIGH (ref 38–126)
Anion gap: 11 (ref 5–15)
BUN: 11 mg/dL (ref 8–23)
CO2: 24 mmol/L (ref 22–32)
Calcium: 9.5 mg/dL (ref 8.9–10.3)
Chloride: 103 mmol/L (ref 98–111)
Creatinine, Ser: 0.68 mg/dL (ref 0.44–1.00)
GFR, Estimated: >60 mL/min (ref >60)
Glucose, Bld: 189 mg/dL — ABNORMAL HIGH (ref 70–99)
Potassium: 3.9 mmol/L (ref 3.5–5.1)
Sodium: 138 mmol/L (ref 135–145)
Total Bilirubin: 0.7 mg/dL (ref 0.3–1.2)
Total Protein: 7.2 g/dL (ref 6.5–8.1)

## 2021-06-13 LAB — CBC WITH DIFFERENTIAL/PLATELET
Abs Immature Granulocytes: 0.01 10*3/uL (ref 0.00–0.07)
Basophils Absolute: 0.1 10*3/uL (ref 0.0–0.1)
Basophils Relative: 1 %
Eosinophils Absolute: 0.2 10*3/uL (ref 0.0–0.5)
Eosinophils Relative: 4 %
HCT: 34.2 % — ABNORMAL LOW (ref 36.0–46.0)
Hemoglobin: 10.9 g/dL — ABNORMAL LOW (ref 12.0–15.0)
Immature Granulocytes: 0 %
Lymphocytes Relative: 28 %
Lymphs Abs: 1.1 10*3/uL (ref 0.7–4.0)
MCH: 26.8 pg (ref 26.0–34.0)
MCHC: 31.9 g/dL (ref 30.0–36.0)
MCV: 84 fL (ref 80.0–100.0)
Monocytes Absolute: 0.4 10*3/uL (ref 0.1–1.0)
Monocytes Relative: 10 %
Neutro Abs: 2.3 10*3/uL (ref 1.7–7.7)
Neutrophils Relative %: 57 %
Platelets: 236 10*3/uL (ref 150–400)
RBC: 4.07 MIL/uL (ref 3.87–5.11)
RDW: 16.7 % — ABNORMAL HIGH (ref 11.5–15.5)
WBC: 4 10*3/uL (ref 4.0–10.5)
nRBC: 0 % (ref 0.0–0.2)

## 2021-06-13 MED ORDER — HEPARIN SOD (PORK) LOCK FLUSH 100 UNIT/ML IV SOLN
500.0000 [IU] | Freq: Once | INTRAVENOUS | Status: AC | PRN
  Administered 2021-06-13: 500 [IU]
  Filled 2021-06-13: qty 5

## 2021-06-13 MED ORDER — SODIUM CHLORIDE 0.9 % IV SOLN
Freq: Once | INTRAVENOUS | Status: AC
  Filled 2021-06-13: qty 250

## 2021-06-13 MED ORDER — SODIUM CHLORIDE 0.9% FLUSH
10.0000 mL | INTRAVENOUS | Status: DC | PRN
  Filled 2021-06-13: qty 10

## 2021-06-13 MED ORDER — SODIUM CHLORIDE 0.9 % IV SOLN
60.0000 mg/m2 | Freq: Once | INTRAVENOUS | Status: AC
  Administered 2021-06-13: 120 mg via INTRAVENOUS
  Filled 2021-06-13: qty 20

## 2021-06-13 MED ORDER — SODIUM CHLORIDE 0.9 % IV SOLN
10.0000 mg | Freq: Once | INTRAVENOUS | Status: AC
  Administered 2021-06-13: 10 mg via INTRAVENOUS
  Filled 2021-06-13: qty 10

## 2021-06-13 MED ORDER — HEPARIN SOD (PORK) LOCK FLUSH 100 UNIT/ML IV SOLN
500.0000 [IU] | Freq: Once | INTRAVENOUS | Status: AC
  Filled 2021-06-13: qty 5

## 2021-06-13 MED ORDER — FAMOTIDINE IN NACL 20-0.9 MG/50ML-% IV SOLN
20.0000 mg | Freq: Once | INTRAVENOUS | Status: AC
  Administered 2021-06-13: 20 mg via INTRAVENOUS
  Filled 2021-06-13: qty 50

## 2021-06-13 MED ORDER — DIPHENHYDRAMINE HCL 50 MG/ML IJ SOLN
50.0000 mg | Freq: Once | INTRAMUSCULAR | Status: AC
  Administered 2021-06-13: 50 mg via INTRAVENOUS
  Filled 2021-06-13: qty 1

## 2021-06-13 MED ORDER — HEPARIN SOD (PORK) LOCK FLUSH 100 UNIT/ML IV SOLN
INTRAVENOUS | Status: AC
  Filled 2021-06-13: qty 5

## 2021-06-13 NOTE — Patient Instructions (Signed)
[facility] [place]  Discharge Instructions: Thank you for choosing [facility] to provide your oncology and hematology care.  If you have a lab appointment with the Cancer Center, please go directly to the Cancer Center and check in at the registration area.  Wear comfortable clothing and clothing appropriate for easy access to any Portacath or PICC line.   We strive to give you quality time with your provider. You may need to reschedule your appointment if you arrive late (15 or more minutes).  Arriving late affects you and other patients whose appointments are after yours.  Also, if you miss three or more appointments without notifying the office, you may be dismissed from the clinic at the provider's discretion.      For prescription refill requests, have your pharmacy contact our office and allow 72 hours for refills to be completed.    Today you received the following chemotherapy and/or immunotherapy agents Taxol      To help prevent nausea and vomiting after your treatment, we encourage you to take your nausea medication as directed.  BELOW ARE SYMPTOMS THAT SHOULD BE REPORTED IMMEDIATELY: *FEVER GREATER THAN 100.4 F (38 C) OR HIGHER *CHILLS OR SWEATING *NAUSEA AND VOMITING THAT IS NOT CONTROLLED WITH YOUR NAUSEA MEDICATION *UNUSUAL SHORTNESS OF BREATH *UNUSUAL BRUISING OR BLEEDING *URINARY PROBLEMS (pain or burning when urinating, or frequent urination) *BOWEL PROBLEMS (unusual diarrhea, constipation, pain near the anus) TENDERNESS IN MOUTH AND THROAT WITH OR WITHOUT PRESENCE OF ULCERS (sore throat, sores in mouth, or a toothache) UNUSUAL RASH, SWELLING OR PAIN  UNUSUAL VAGINAL DISCHARGE OR ITCHING   Items with * indicate a potential emergency and should be followed up as soon as possible or go to the Emergency Department if any problems should occur.  Please show the CHEMOTHERAPY ALERT CARD or IMMUNOTHERAPY ALERT CARD at check-in to the  Emergency Department and triage nurse.  Should you have questions after your visit or need to cancel or reschedule your appointment, please contact [facility] [place]  [phone] and follow the prompts.  Office hours are 8:00 a.m. to 4:30 p.m. Monday - Friday. Please note that voicemails left after 4:00 p.m. may not be returned until the following business day.  We are closed weekends and major holidays. You have access to a nurse at all times for urgent questions. Please call the main number to the clinic [phone] and follow the prompts.  For any non-urgent questions, you may also contact your provider using MyChart. We now offer e-Visits for anyone 18 and older to request care online for non-urgent symptoms. For details visit [email or web address].   Also download the MyChart app! Go to the app store, search "MyChart", open the app, select [place], and log in with your MyChart username and password.  Due to Covid, a mask is required upon entering the hospital/clinic. If you do not have a mask, one will be given to you upon arrival. For doctor visits, patients may have 1 support person aged 18 or older with them. For treatment visits, patients cannot have anyone with them due to current Covid guidelines and our immunocompromised population.  

## 2021-06-14 ENCOUNTER — Telehealth: Payer: Self-pay

## 2021-06-20 ENCOUNTER — Inpatient Hospital Stay: Payer: PPO

## 2021-06-20 ENCOUNTER — Inpatient Hospital Stay (HOSPITAL_BASED_OUTPATIENT_CLINIC_OR_DEPARTMENT_OTHER): Payer: PPO | Admitting: Nurse Practitioner

## 2021-06-20 ENCOUNTER — Other Ambulatory Visit: Payer: Self-pay

## 2021-06-20 ENCOUNTER — Encounter: Payer: Self-pay | Admitting: Nurse Practitioner

## 2021-06-20 VITALS — BP 135/77 | HR 84 | Temp 98.2°F | Resp 16 | Ht 67.0 in | Wt 182.0 lb

## 2021-06-20 DIAGNOSIS — Z5112 Encounter for antineoplastic immunotherapy: Secondary | ICD-10-CM | POA: Diagnosis not present

## 2021-06-20 DIAGNOSIS — C50211 Malignant neoplasm of upper-inner quadrant of right female breast: Secondary | ICD-10-CM | POA: Diagnosis not present

## 2021-06-20 DIAGNOSIS — C50911 Malignant neoplasm of unspecified site of right female breast: Secondary | ICD-10-CM | POA: Diagnosis not present

## 2021-06-20 DIAGNOSIS — C7951 Secondary malignant neoplasm of bone: Secondary | ICD-10-CM

## 2021-06-20 DIAGNOSIS — Z171 Estrogen receptor negative status [ER-]: Secondary | ICD-10-CM

## 2021-06-20 DIAGNOSIS — Z5111 Encounter for antineoplastic chemotherapy: Secondary | ICD-10-CM

## 2021-06-20 DIAGNOSIS — R7401 Elevation of levels of liver transaminase levels: Secondary | ICD-10-CM

## 2021-06-20 DIAGNOSIS — D4861 Neoplasm of uncertain behavior of right breast: Secondary | ICD-10-CM

## 2021-06-20 LAB — CBC WITH DIFFERENTIAL/PLATELET
Abs Immature Granulocytes: 0.07 10*3/uL (ref 0.00–0.07)
Basophils Absolute: 0.1 10*3/uL (ref 0.0–0.1)
Basophils Relative: 2 %
Eosinophils Absolute: 0.3 10*3/uL (ref 0.0–0.5)
Eosinophils Relative: 6 %
HCT: 34.1 % — ABNORMAL LOW (ref 36.0–46.0)
Hemoglobin: 11.1 g/dL — ABNORMAL LOW (ref 12.0–15.0)
Immature Granulocytes: 1 %
Lymphocytes Relative: 22 %
Lymphs Abs: 1.1 10*3/uL (ref 0.7–4.0)
MCH: 26.9 pg (ref 26.0–34.0)
MCHC: 32.6 g/dL (ref 30.0–36.0)
MCV: 82.8 fL (ref 80.0–100.0)
Monocytes Absolute: 0.4 10*3/uL (ref 0.1–1.0)
Monocytes Relative: 8 %
Neutro Abs: 3.2 10*3/uL (ref 1.7–7.7)
Neutrophils Relative %: 61 %
Platelets: 215 10*3/uL (ref 150–400)
RBC: 4.12 MIL/uL (ref 3.87–5.11)
RDW: 16.4 % — ABNORMAL HIGH (ref 11.5–15.5)
WBC: 5.2 10*3/uL (ref 4.0–10.5)
nRBC: 0 % (ref 0.0–0.2)

## 2021-06-20 LAB — COMPREHENSIVE METABOLIC PANEL
ALT: 38 U/L (ref 0–44)
AST: 46 U/L — ABNORMAL HIGH (ref 15–41)
Albumin: 3.8 g/dL (ref 3.5–5.0)
Alkaline Phosphatase: 162 U/L — ABNORMAL HIGH (ref 38–126)
Anion gap: 10 (ref 5–15)
BUN: 15 mg/dL (ref 8–23)
CO2: 23 mmol/L (ref 22–32)
Calcium: 9.4 mg/dL (ref 8.9–10.3)
Chloride: 102 mmol/L (ref 98–111)
Creatinine, Ser: 0.72 mg/dL (ref 0.44–1.00)
GFR, Estimated: >60 mL/min (ref >60)
Glucose, Bld: 159 mg/dL — ABNORMAL HIGH (ref 70–99)
Potassium: 3.7 mmol/L (ref 3.5–5.1)
Sodium: 135 mmol/L (ref 135–145)
Total Bilirubin: 0.6 mg/dL (ref 0.3–1.2)
Total Protein: 7.6 g/dL (ref 6.5–8.1)

## 2021-06-20 MED ORDER — SODIUM CHLORIDE 0.9 % IV SOLN
60.0000 mg/m2 | Freq: Once | INTRAVENOUS | Status: AC
  Administered 2021-06-20: 120 mg via INTRAVENOUS
  Filled 2021-06-20: qty 20

## 2021-06-20 MED ORDER — DIPHENHYDRAMINE HCL 50 MG/ML IJ SOLN
50.0000 mg | Freq: Once | INTRAMUSCULAR | Status: AC
  Administered 2021-06-20: 50 mg via INTRAVENOUS
  Filled 2021-06-20: qty 1

## 2021-06-20 MED ORDER — SODIUM CHLORIDE 0.9 % IV SOLN
10.0000 mg | Freq: Once | INTRAVENOUS | Status: AC
  Administered 2021-06-20: 10 mg via INTRAVENOUS
  Filled 2021-06-20: qty 10

## 2021-06-20 MED ORDER — SODIUM CHLORIDE 0.9% FLUSH
10.0000 mL | INTRAVENOUS | Status: DC | PRN
  Filled 2021-06-20: qty 10

## 2021-06-20 MED ORDER — FAMOTIDINE IN NACL 20-0.9 MG/50ML-% IV SOLN
20.0000 mg | Freq: Once | INTRAVENOUS | Status: AC
  Administered 2021-06-20: 20 mg via INTRAVENOUS
  Filled 2021-06-20: qty 50

## 2021-06-20 MED ORDER — SODIUM CHLORIDE 0.9 % IV SOLN
Freq: Once | INTRAVENOUS | Status: AC
  Filled 2021-06-20: qty 250

## 2021-06-20 MED ORDER — HEPARIN SOD (PORK) LOCK FLUSH 100 UNIT/ML IV SOLN
500.0000 [IU] | Freq: Once | INTRAVENOUS | Status: AC | PRN
  Administered 2021-06-20: 500 [IU]
  Filled 2021-06-20: qty 5

## 2021-06-20 NOTE — Progress Notes (Signed)
[facility] [place] ?CONSULT NOTE ? ?Patient Care Team: ?[name], [name] ?Noreene Filbert, MD as Referring Physician (Radiation Oncology) ?Cammie Sickle, MD as Consulting Physician (Internal Medicine) ?Robert Bellow, MD as Consulting Physician (General Surgery) ?Jeral Fruit, RN as Registered Nurse ?[name], RN as [place] Management ?[place], [place], [place] as Education officer, museum ? ?CHIEF COMPLAINTS/PURPOSE OF CONSULTATION: Breast cancer ? 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 Byrnett.] [id number] Carbo-taxol-AC;  JAN 2022-s/p Lumpectomy & ALND- [ypT1a (28m ) ypN1 (2/11 LN-positive)]; s/p radiation end of April 2022. ? ?#Early June 2022-Xeloda 1000 mg per metered square [2000] 2 weeks on 1 week of 6-8 cycles; July 11th 2022- STARTING cycle #3-cut down the dose to 1500 mg BID[sec to HFS].  ? ?# JAN 2023- A. SKIN, RIGHT BREAST; PUNCH BIOPSY: [Dr.Byrnett] ?- INVASIVE CARCINOMA WITH DERMAL INFILTRATION AND LYMPHATIC INVASION. ER- weak POSISTIVE [10%]; PR/her2 NEG [[facility] 0]; JAN 20th, 2023- IMPRESSION: ?1. Although the hypermetabolic right axillary node has resolved ?since the prior PET, there is a new hypermetabolic right low ?cervical/supraclavicular node, suspicious for nodal metastasis. ?2. Decrease in right breast hypermetabolism with nonspecific ?overlying skin thickening in the setting of prior lumpectomy. ?3. Low-level hypermetabolism within the T2 vertebral body, ?suspicious for isolated osseous metastasis. This could be confirmed ?with pre and postcontrast thoracic spine MRI. ?4. Hypermetabolic thoracic nodes, slightly progressive. Given ?progression, nodal metastasis are slightly favored over reactive ?etiologies. ?5. Incidental findings, including: Aortic Atherosclerosis ?(ICD10-I70.0). Tiny hiatal hernia. Hepatic steatosis with ?nonspecific caudate lobe  enlargemen ? ?Comment:  ?The carcinoma is morphologically similar to the post-neoadjuvant mammary  ?carcinoma seen in the January 2022 right breast wide excision and lymph  ?Nodes ? ?# FEB 6th-Taxol single agent #1; FEB 13th, 2023- [CPS =15]; Taxol-Keytruda ? ? 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 ? ? ?  ?Carcinoma of upper-inner quadrant of right breast in female, estrogen receptor negative ([facility]  ?10/07/2019 Initial Diagnosis  ? Carcinoma of upper-inner quadrant of right breast in female, estrogen receptor negative ([facility] ?  ?12/04/2019 Genetic Testing  ? Negative genetic testing. No pathogenic variants identified on the [facility] Common Hereditary Cancers Panel + Skin Cancers Panel. The report date is 12/04/2019. ? ?The Common Hereditary Cancers Panel + Skin Cancers Panel offered by [facility] includes sequencing and/or deletion duplication testing of the following 54 genes: APC*, ATM*, AXIN2, BAP1, BARD1, BMPR1A, BRCA1, BRCA2, BRIP1, CDH1, CDK4, CDKN2A (p14ARF), CDKN2A (p16INK4a), CHEK2, CTNNA1, DICER1*, EPCAM*, GREM1*, HOXB13, KIT, ?MEN1*, MITF*, MLH1*, MSH2*, MSH3*, MSH6*, MUTYH, NBN, NF1*, NTHL1, PALB2, PDGFRA, PMS2*, POLD1*, POLE, POT1, PTCH1, PTEN*, RAD50, RAD51C, RAD51D, RB1*, RNF43, SDHA*, SDHB, SDHC*, SDHD, SMAD4, SMARCA4, STK11, SUFU, TP53, TSC1*, TSC2, VHL.  ?  ?05/07/2020 Cancer Staging  ? Staging form: Breast, AJCC 8th Edition ?- Clinical: Stage IIIB (cT2, cN1, cM0, G3, ER-, PR-, HER2-) - Signed by BCammie Sickle MD on 05/07/2020 ?  ?04/18/2021 Cancer Staging  ? Staging form: Breast, AJCC 8th Edition ?- Pathologic: Stage IV (pT4d, pN3c, cM1, ER+, PR-, HER2-) - Signed by BCammie Sickle MD on 04/18/2021 ?Nuclear grade: G3 ?  ? ? ? ?HISTORY OF PRESENTING ILLNESS: Accompanied by husband.  Walking independently. ? ?Ashlee [id number].o. 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  ? ?She has started ozempic per pcp for blood  sugar/diabetes.  ? ?Complains of some knee pain that started yesterday. Was too uncomfortable to walk but improved with rest.  ? ?Review of Systems  ?Constitutional:  Positive for malaise/fatigue. Negative for chills, diaphoresis, fever and weight loss.  ?HENT:  Negative for nosebleeds and sore throat.   ?Eyes:  Negative for double vision.  ?Respiratory:  Negative for cough, hemoptysis, sputum production and wheezing.   ?Cardiovascular:  Negative for chest pain, palpitations, orthopnea and leg swelling.  ?Gastrointestinal:  Negative for abdominal pain, blood in stool, constipation and melena.  ?Genitourinary:  Negative for dysuria, frequency and urgency.  ?Musculoskeletal:  Positive for back pain, joint pain and neck pain.  ?Neurological:  Positive for tingling. Negative for dizziness, focal weakness, weakness and headaches.  ?Endo/Heme/Allergies:  Does not bruise/bleed easily.  ?Psychiatric/Behavioral:  Negative for depression. The patient is not nervous/anxious and does not have insomnia.    ? ?MEDICAL HISTORY:  ?Past Medical History:  ?Diagnosis Date  ? Cancer [facility]) 2021  ? right breast  ? Cyst, breast   ? benign  ? Diabetes mellitus without complication ([place])   ? Family history of melanoma   ? Family history of ovarian cancer   ? GERD (gastroesophageal reflux disease)   ? H/O: rheumatic fever   ? History of colonoscopy 2010  ? 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 Hyperlipidemia   ? Hypertension   ? Menopause   ? at age 71  ? Personal history of chemotherapy 09/2019  ? RIGHT  INVASIVE MAMMARY CARCINOMA  ? Personal history of radiation therapy   ? ? ?SURGICAL HISTORY: ?Past Surgical History:  ?Procedure Laterality Date  ? APPENDECTOMY  2006  ? BREAST BIOPSY Right 09/22/2019  ? INVASIVE MAMMARY CARCINOMA  ? BREAST CYST ASPIRATION Left 1999  ? BREAST LUMPECTOMY Left 04/23/2020  ? surgery with NL and SN   ? BREAST  LUMPECTOMY WITH NEEDLE LOCALIZATION Right 04/23/2020  ? Procedure: BREAST LUMPECTOMY WITH NEEDLE LOCALIZATION;  Surgeon: Robert Bellow, MD;  Location: [facility] ORS;  Service: General;  Laterality: Right;  ? BREAST LUMPECTOMY WITH SENTINEL LYMPH NODE BIOPSY Right 04/23/2020  ? Procedure: BREAST LUMPECTOMY WITH SENTINEL LYMPH NODE BX;  Surgeon: Robert Bellow, MD;  Location: [facility] ORS;  Service: General;  Laterality: Right;  ? CHOLECYSTECTOMY  2006  ? COLONOSCOPY    ? PORTACATH PLACEMENT Left 10/06/2019  ? Procedure: INSERTION PORT-A-CATH;  Surgeon: Robert Bellow, MD;  Location: [facility] ORS;  Service: General;  Laterality: Left;  ? SUBMUCOSAL TATTOO INJECTION Right 10/06/2019  ? Procedure: Right axillary TATTOO INJECTION;  Surgeon: Robert Bellow, MD;  Location: [facility] ORS;  Service: General;  Laterality: Right;  ? VAGINAL DELIVERY    ? x3  ? ? ?SOCIAL HISTORY: ?Social History  ? ?Socioeconomic History  ? Marital status: Married  ?  Spouse name: Chrissie Noa  ? Number of children: 3  ? Years of education: Not on file  ? Highest education level: Not on file  ?Occupational History  ? Occupation: stocker  ?  Employer: [email or web address]  ?Tobacco Use  ? Smoking status: Former  ?  Years: 1.00  ?  Types: Cigarettes  ?  Quit date: 12/26/2005  ?  Years since quitting: 15.4  ? Smokeless tobacco: Never  ? Tobacco comments:  ?  smoked for less than 1 years,  1 cig/day  ?Vaping Use  ? Vaping Use: Never used  ?Substance and Sexual Activity  ? Alcohol use: No  ?  Drug use: No  ? Sexual activity: Never  ?  Partners: Female  ?Other Topics Concern  ? Not on file  ?Social History Narrative  ? Widowed, March 2014; remarried.  ?   ? [employer] retd; quit smoking 1ppw; no alcohol.   ?   ? ?Social Determinants of Health  ? ?Financial Resource Strain: Medium Risk  ? Difficulty of Paying Living Expenses: Somewhat hard  ?Food Insecurity: No Food Insecurity  ? Worried About Charity fundraiser in the Last Year: Never true  ? Ran Out of Food in the Last Year:  Never true  ?Transportation Needs: No Transportation Needs  ? Lack of Transportation (Medical): No  ? Lack of Transportation (Non-Medical): No  ?Physical Activity: Inactive  ? Days of Exercise per Week:

## 2021-06-20 NOTE — Patient Instructions (Signed)
[facility] [place]  Discharge Instructions: Thank you for choosing [facility] to provide your oncology and hematology care.  If you have a lab appointment with the Cancer Center, please go directly to the Cancer Center and check in at the registration area.  Wear comfortable clothing and clothing appropriate for easy access to any Portacath or PICC line.   We strive to give you quality time with your provider. You may need to reschedule your appointment if you arrive late (15 or more minutes).  Arriving late affects you and other patients whose appointments are after yours.  Also, if you miss three or more appointments without notifying the office, you may be dismissed from the clinic at the provider's discretion.      For prescription refill requests, have your pharmacy contact our office and allow 72 hours for refills to be completed.    Today you received the following chemotherapy and/or immunotherapy agents Taxol      To help prevent nausea and vomiting after your treatment, we encourage you to take your nausea medication as directed.  BELOW ARE SYMPTOMS THAT SHOULD BE REPORTED IMMEDIATELY: *FEVER GREATER THAN 100.4 F (38 C) OR HIGHER *CHILLS OR SWEATING *NAUSEA AND VOMITING THAT IS NOT CONTROLLED WITH YOUR NAUSEA MEDICATION *UNUSUAL SHORTNESS OF BREATH *UNUSUAL BRUISING OR BLEEDING *URINARY PROBLEMS (pain or burning when urinating, or frequent urination) *BOWEL PROBLEMS (unusual diarrhea, constipation, pain near the anus) TENDERNESS IN MOUTH AND THROAT WITH OR WITHOUT PRESENCE OF ULCERS (sore throat, sores in mouth, or a toothache) UNUSUAL RASH, SWELLING OR PAIN  UNUSUAL VAGINAL DISCHARGE OR ITCHING   Items with * indicate a potential emergency and should be followed up as soon as possible or go to the Emergency Department if any problems should occur.  Please show the CHEMOTHERAPY ALERT CARD or IMMUNOTHERAPY ALERT CARD at check-in to the  Emergency Department and triage nurse.  Should you have questions after your visit or need to cancel or reschedule your appointment, please contact [facility] [place]  [phone] and follow the prompts.  Office hours are 8:00 a.m. to 4:30 p.m. Monday - Friday. Please note that voicemails left after 4:00 p.m. may not be returned until the following business day.  We are closed weekends and major holidays. You have access to a nurse at all times for urgent questions. Please call the main number to the clinic [phone] and follow the prompts.  For any non-urgent questions, you may also contact your provider using MyChart. We now offer e-Visits for anyone 18 and older to request care online for non-urgent symptoms. For details visit [email or web address].   Also download the MyChart app! Go to the app store, search "MyChart", open the app, select [place], and log in with your MyChart username and password.  Due to Covid, a mask is required upon entering the hospital/clinic. If you do not have a mask, one will be given to you upon arrival. For doctor visits, patients may have 1 support person aged 18 or older with them. For treatment visits, patients cannot have anyone with them due to current Covid guidelines and our immunocompromised population.  

## 2021-06-20 NOTE — Progress Notes (Signed)
Wanted to make mention of left knee pain that started Saturday.  ?

## 2021-06-24 DIAGNOSIS — C50211 Malignant neoplasm of upper-inner quadrant of right female breast: Secondary | ICD-10-CM | POA: Diagnosis not present

## 2021-06-24 DIAGNOSIS — Z171 Estrogen receptor negative status [ER-]: Secondary | ICD-10-CM

## 2021-06-24 DIAGNOSIS — I10 Essential (primary) hypertension: Secondary | ICD-10-CM | POA: Diagnosis not present

## 2021-06-24 DIAGNOSIS — E785 Hyperlipidemia, unspecified: Secondary | ICD-10-CM | POA: Diagnosis not present

## 2021-06-24 DIAGNOSIS — E113559 Type 2 diabetes mellitus with stable proliferative diabetic retinopathy, unspecified eye: Secondary | ICD-10-CM

## 2021-06-27 ENCOUNTER — Inpatient Hospital Stay: Payer: PPO

## 2021-06-27 ENCOUNTER — Inpatient Hospital Stay: Payer: PPO | Attending: Internal Medicine

## 2021-06-27 ENCOUNTER — Inpatient Hospital Stay (HOSPITAL_BASED_OUTPATIENT_CLINIC_OR_DEPARTMENT_OTHER): Payer: PPO | Admitting: Nurse Practitioner

## 2021-06-27 ENCOUNTER — Encounter: Payer: Self-pay | Admitting: Nurse Practitioner

## 2021-06-27 VITALS — BP 140/72 | HR 93 | Temp 98.1°F | Resp 16 | Ht 67.0 in | Wt 184.0 lb

## 2021-06-27 DIAGNOSIS — Z170 Estrogen receptor positive status [ER+]: Secondary | ICD-10-CM | POA: Insufficient documentation

## 2021-06-27 DIAGNOSIS — Z5111 Encounter for antineoplastic chemotherapy: Secondary | ICD-10-CM | POA: Diagnosis not present

## 2021-06-27 DIAGNOSIS — Z853 Personal history of malignant neoplasm of breast: Secondary | ICD-10-CM | POA: Diagnosis not present

## 2021-06-27 DIAGNOSIS — Z7983 Long term (current) use of bisphosphonates: Secondary | ICD-10-CM | POA: Diagnosis not present

## 2021-06-27 DIAGNOSIS — Z79899 Other long term (current) drug therapy: Secondary | ICD-10-CM | POA: Insufficient documentation

## 2021-06-27 DIAGNOSIS — C50911 Malignant neoplasm of unspecified site of right female breast: Secondary | ICD-10-CM | POA: Diagnosis not present

## 2021-06-27 DIAGNOSIS — Z7984 Long term (current) use of oral hypoglycemic drugs: Secondary | ICD-10-CM | POA: Insufficient documentation

## 2021-06-27 DIAGNOSIS — E871 Hypo-osmolality and hyponatremia: Secondary | ICD-10-CM | POA: Diagnosis not present

## 2021-06-27 DIAGNOSIS — C50211 Malignant neoplasm of upper-inner quadrant of right female breast: Secondary | ICD-10-CM

## 2021-06-27 DIAGNOSIS — Z5112 Encounter for antineoplastic immunotherapy: Secondary | ICD-10-CM | POA: Diagnosis not present

## 2021-06-27 DIAGNOSIS — R7989 Other specified abnormal findings of blood chemistry: Secondary | ICD-10-CM | POA: Diagnosis not present

## 2021-06-27 DIAGNOSIS — E1165 Type 2 diabetes mellitus with hyperglycemia: Secondary | ICD-10-CM | POA: Diagnosis not present

## 2021-06-27 DIAGNOSIS — D4861 Neoplasm of uncertain behavior of right breast: Secondary | ICD-10-CM

## 2021-06-27 DIAGNOSIS — Z171 Estrogen receptor negative status [ER-]: Secondary | ICD-10-CM

## 2021-06-27 DIAGNOSIS — C7951 Secondary malignant neoplasm of bone: Secondary | ICD-10-CM | POA: Diagnosis not present

## 2021-06-27 DIAGNOSIS — Z5181 Encounter for therapeutic drug level monitoring: Secondary | ICD-10-CM

## 2021-06-27 LAB — CBC WITH DIFFERENTIAL/PLATELET
Abs Immature Granulocytes: 0.03 10*3/uL (ref 0.00–0.07)
Basophils Absolute: 0.1 10*3/uL (ref 0.0–0.1)
Basophils Relative: 2 %
Eosinophils Absolute: 0.2 10*3/uL (ref 0.0–0.5)
Eosinophils Relative: 4 %
HCT: 33.9 % — ABNORMAL LOW (ref 36.0–46.0)
Hemoglobin: 10.8 g/dL — ABNORMAL LOW (ref 12.0–15.0)
Immature Granulocytes: 1 %
Lymphocytes Relative: 27 %
Lymphs Abs: 1.1 10*3/uL (ref 0.7–4.0)
MCH: 26.5 pg (ref 26.0–34.0)
MCHC: 31.9 g/dL (ref 30.0–36.0)
MCV: 83.3 fL (ref 80.0–100.0)
Monocytes Absolute: 0.3 10*3/uL (ref 0.1–1.0)
Monocytes Relative: 8 %
Neutro Abs: 2.4 10*3/uL (ref 1.7–7.7)
Neutrophils Relative %: 58 %
Platelets: 204 10*3/uL (ref 150–400)
RBC: 4.07 MIL/uL (ref 3.87–5.11)
RDW: 17.3 % — ABNORMAL HIGH (ref 11.5–15.5)
WBC: 4.1 10*3/uL (ref 4.0–10.5)
nRBC: 0 % (ref 0.0–0.2)

## 2021-06-27 LAB — COMPREHENSIVE METABOLIC PANEL
ALT: 32 U/L (ref 0–44)
AST: 42 U/L — ABNORMAL HIGH (ref 15–41)
Albumin: 3.9 g/dL (ref 3.5–5.0)
Alkaline Phosphatase: 114 U/L (ref 38–126)
Anion gap: 10 (ref 5–15)
BUN: 15 mg/dL (ref 8–23)
CO2: 23 mmol/L (ref 22–32)
Calcium: 9.6 mg/dL (ref 8.9–10.3)
Chloride: 101 mmol/L (ref 98–111)
Creatinine, Ser: 0.75 mg/dL (ref 0.44–1.00)
GFR, Estimated: >60 mL/min (ref >60)
Glucose, Bld: 182 mg/dL — ABNORMAL HIGH (ref 70–99)
Potassium: 3.7 mmol/L (ref 3.5–5.1)
Sodium: 134 mmol/L — ABNORMAL LOW (ref 135–145)
Total Bilirubin: 0.6 mg/dL (ref 0.3–1.2)
Total Protein: 6.8 g/dL (ref 6.5–8.1)

## 2021-06-27 MED ORDER — SODIUM CHLORIDE 0.9 % IV SOLN
200.0000 mg | Freq: Once | INTRAVENOUS | Status: AC
  Administered 2021-06-27: 200 mg via INTRAVENOUS
  Filled 2021-06-27: qty 8

## 2021-06-27 MED ORDER — DIPHENHYDRAMINE HCL 50 MG/ML IJ SOLN
50.0000 mg | Freq: Once | INTRAMUSCULAR | Status: AC
  Administered 2021-06-27: 50 mg via INTRAVENOUS
  Filled 2021-06-27: qty 1

## 2021-06-27 MED ORDER — SODIUM CHLORIDE 0.9 % IV SOLN
10.0000 mg | Freq: Once | INTRAVENOUS | Status: AC
  Administered 2021-06-27: 10 mg via INTRAVENOUS
  Filled 2021-06-27: qty 10

## 2021-06-27 MED ORDER — FAMOTIDINE IN NACL 20-0.9 MG/50ML-% IV SOLN
20.0000 mg | Freq: Once | INTRAVENOUS | Status: AC
  Administered 2021-06-27: 20 mg via INTRAVENOUS
  Filled 2021-06-27: qty 50

## 2021-06-27 MED ORDER — SODIUM CHLORIDE 0.9 % IV SOLN
Freq: Once | INTRAVENOUS | Status: AC
  Filled 2021-06-27: qty 250

## 2021-06-27 MED ORDER — HEPARIN SOD (PORK) LOCK FLUSH 100 UNIT/ML IV SOLN
INTRAVENOUS | Status: AC
  Filled 2021-06-27: qty 5

## 2021-06-27 MED ORDER — ZOLEDRONIC ACID 4 MG/100ML IV SOLN
4.0000 mg | Freq: Once | INTRAVENOUS | Status: AC
  Administered 2021-06-27: 4 mg via INTRAVENOUS
  Filled 2021-06-27: qty 100

## 2021-06-27 MED ORDER — SODIUM CHLORIDE 0.9 % IV SOLN
60.0000 mg/m2 | Freq: Once | INTRAVENOUS | Status: AC
  Administered 2021-06-27: 120 mg via INTRAVENOUS
  Filled 2021-06-27: qty 20

## 2021-06-27 MED ORDER — HEPARIN SOD (PORK) LOCK FLUSH 100 UNIT/ML IV SOLN
500.0000 [IU] | Freq: Once | INTRAVENOUS | Status: DC | PRN
  Filled 2021-06-27: qty 5

## 2021-06-27 NOTE — Progress Notes (Signed)
[place] ?CONSULT NOTE ? ?Patient Care Team: ?[name], [name] ?Noreene Filbert, MD as Referring Physician (Radiation Oncology) ?Cammie Sickle, MD as Consulting Physician (Internal Medicine) ?Robert Bellow, MD as Consulting Physician (General Surgery) ?Jeral Fruit, RN as Registered Nurse ?[name], RN as [place] Management ?[place], [place], [place] as Education officer, museum ? ?CHIEF COMPLAINTS/PURPOSE OF CONSULTATION: Breast cancer ? 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 Byrnett.] [id number] Carbo-taxol-AC;  JAN 2022-s/p Lumpectomy & ALND- [ypT1a (22m ) ypN1 (2/11 LN-positive)]; s/p radiation end of April 2022. ? ?#Early June 2022-Xeloda 1000 mg per metered square [2000] 2 weeks on 1 week of 6-8 cycles; July 11th 2022- STARTING cycle #3-cut down the dose to 1500 mg BID[sec to HFS].  ? ?# JAN 2023- A. SKIN, RIGHT BREAST; PUNCH BIOPSY: [Dr.Byrnett] ?- INVASIVE CARCINOMA WITH DERMAL INFILTRATION AND LYMPHATIC INVASION. ER- weak POSISTIVE [10%]; PR/her2 NEG [[facility] 0]; JAN 20th, 2023- IMPRESSION: ?1. Although the hypermetabolic right axillary node has resolved ?since the prior PET, there is a new hypermetabolic right low ?cervical/supraclavicular node, suspicious for nodal metastasis. ?2. Decrease in right breast hypermetabolism with nonspecific ?overlying skin thickening in the setting of prior lumpectomy. ?3. Low-level hypermetabolism within the T2 vertebral body, ?suspicious for isolated osseous metastasis. This could be confirmed ?with pre and postcontrast thoracic spine MRI. ?4. Hypermetabolic thoracic nodes, slightly progressive. Given ?progression, nodal metastasis are slightly favored over reactive ?etiologies. ?5. Incidental findings, including: Aortic Atherosclerosis ?(ICD10-I70.0). Tiny hiatal hernia. Hepatic steatosis with ?nonspecific caudate lobe  enlargemen ? ?Comment:  ?The carcinoma is morphologically similar to the post-neoadjuvant mammary  ?carcinoma seen in the January 2022 right breast wide excision and lymph  ?Nodes ? ?# FEB 6th-Taxol single agent #1; FEB 13th, 2023- [CPS =15]; Taxol-Keytruda ? ? 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 ? ? ?  ?Carcinoma of upper-inner quadrant of right breast in female, estrogen receptor negative ([place]  ?10/07/2019 Initial Diagnosis  ? Carcinoma of upper-inner quadrant of right breast in female, estrogen receptor negative ([facility] ?  ?12/04/2019 Genetic Testing  ? Negative genetic testing. No pathogenic variants identified on the [facility] Common Hereditary Cancers Panel + Skin Cancers Panel. The report date is 12/04/2019. ? ?The Common Hereditary Cancers Panel + Skin Cancers Panel offered by [facility] includes sequencing and/or deletion duplication testing of the following 54 genes: APC*, ATM*, AXIN2, BAP1, BARD1, BMPR1A, BRCA1, BRCA2, BRIP1, CDH1, CDK4, CDKN2A (p14ARF), CDKN2A (p16INK4a), CHEK2, CTNNA1, DICER1*, EPCAM*, GREM1*, HOXB13, KIT, ?MEN1*, MITF*, MLH1*, MSH2*, MSH3*, MSH6*, MUTYH, NBN, NF1*, NTHL1, PALB2, PDGFRA, PMS2*, POLD1*, POLE, POT1, PTCH1, PTEN*, RAD50, RAD51C, RAD51D, RB1*, RNF43, SDHA*, SDHB, SDHC*, SDHD, SMAD4, SMARCA4, STK11, SUFU, TP53, TSC1*, TSC2, VHL.  ?  ?05/07/2020 Cancer Staging  ? Staging form: Breast, AJCC 8th Edition ?- Clinical: Stage IIIB (cT2, cN1, cM0, G3, ER-, PR-, HER2-) - Signed by BCammie Sickle MD on 05/07/2020 ?  ?04/18/2021 Cancer Staging  ? Staging form: Breast, AJCC 8th Edition ?- Pathologic: Stage IV (pT4d, pN3c, cM1, ER+, PR-, HER2-) - Signed by BCammie Sickle MD on 04/18/2021 ?Nuclear grade: G3 ?  ? ? ? ?HISTORY OF PRESENTING ILLNESS: Accompanied by husband.  Walking independently. ? ?Ashlee ROYSE71y.o. 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 Feels well today. Continues CGM and ozempic. Knee pain has  improved.  ? ?Review of Systems  ?Constitutional:  Negative for chills, diaphoresis, fever, malaise/fatigue and weight loss.  ?HENT:  Negative for nosebleeds and sore throat.   ?Eyes:  Negative for double vision.  ?Respiratory:  Negative for cough, hemoptysis, sputum production and wheezing.   ?Cardiovascular:  Negative for chest pain, palpitations, orthopnea and leg swelling.  ?Gastrointestinal:  Negative for abdominal pain, blood in stool, constipation and melena.  ?Genitourinary:  Negative for dysuria, frequency and urgency.  ?Musculoskeletal:  Negative for back pain, joint pain and neck pain.  ?Neurological:  Positive for tingling. Negative for dizziness, focal weakness, weakness and headaches.  ?Endo/Heme/Allergies:  Does not bruise/bleed easily.  ?Psychiatric/Behavioral:  Negative for depression. The patient is not nervous/anxious and does not have insomnia.    ? ?MEDICAL HISTORY:  ?Past Medical History:  ?Diagnosis Date  ? Cancer [facility]) 2021  ? right breast  ? Cyst, breast   ? benign  ? Diabetes mellitus without complication ([place])   ? Family history of melanoma   ? Family history of ovarian cancer   ? GERD (gastroesophageal reflux disease)   ? H/O: rheumatic fever   ? History of colonoscopy 2010  ? 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 Hyperlipidemia   ? Hypertension   ? Menopause   ? at age 33  ? Personal history of chemotherapy 09/2019  ? RIGHT  INVASIVE MAMMARY CARCINOMA  ? Personal history of radiation therapy   ? ? ?SURGICAL HISTORY: ?Past Surgical History:  ?Procedure Laterality Date  ? APPENDECTOMY  2006  ? BREAST BIOPSY Right 09/22/2019  ? INVASIVE MAMMARY CARCINOMA  ? BREAST CYST ASPIRATION Left 1999  ? BREAST LUMPECTOMY Left 04/23/2020  ? surgery with NL and SN   ? BREAST LUMPECTOMY WITH NEEDLE LOCALIZATION Right 04/23/2020  ? Procedure: BREAST LUMPECTOMY WITH NEEDLE LOCALIZATION;   Surgeon: Robert Bellow, MD;  Location: [facility] ORS;  Service: General;  Laterality: Right;  ? BREAST LUMPECTOMY WITH SENTINEL LYMPH NODE BIOPSY Right 04/23/2020  ? Procedure: BREAST LUMPECTOMY WITH SENTINEL LYMPH NODE BX;  Surgeon: Robert Bellow, MD;  Location: [facility] ORS;  Service: General;  Laterality: Right;  ? CHOLECYSTECTOMY  2006  ? COLONOSCOPY    ? PORTACATH PLACEMENT Left 10/06/2019  ? Procedure: INSERTION PORT-A-CATH;  Surgeon: Robert Bellow, MD;  Location: [facility] ORS;  Service: General;  Laterality: Left;  ? SUBMUCOSAL TATTOO INJECTION Right 10/06/2019  ? Procedure: Right axillary TATTOO INJECTION;  Surgeon: Robert Bellow, MD;  Location: [facility] ORS;  Service: General;  Laterality: Right;  ? VAGINAL DELIVERY    ? x3  ? ? ?SOCIAL HISTORY: ?Social History  ? ?Socioeconomic History  ? Marital status: Married  ?  Spouse name: Chrissie Noa  ? Number of children: 3  ? Years of education: Not on file  ? Highest education level: Not on file  ?Occupational History  ? Occupation: stocker  ?  Employer: [email or web address]  ?Tobacco Use  ? Smoking status: Former  ?  Years: 1.00  ?  Types: Cigarettes  ?  Quit date: 12/26/2005  ?  Years since quitting: 15.5  ? Smokeless tobacco: Never  ? Tobacco comments:  ?  smoked for less than 1 years,  1 cig/day  ?Vaping Use  ? Vaping Use: Never used  ?Substance and Sexual Activity  ? Alcohol use: No  ? Drug use: No  ? Sexual activity: Never  ?  Partners: Female  ?Other Topics Concern  ?  Not on file  ?Social History Narrative  ? Widowed, March 2014; remarried.  ?   ? [employer] retd; quit smoking 1ppw; no alcohol.   ?   ? ?Social Determinants of Health  ? ?Financial Resource Strain: Medium Risk  ? Difficulty of Paying Living Expenses: Somewhat hard  ?Food Insecurity: No Food Insecurity  ? Worried About Charity fundraiser in the Last Year: Never true  ? Ran Out of Food in the Last Year: Never true  ?Transportation Needs: No Transportation Needs  ? Lack of Transportation (Medical): No  ? Lack of  Transportation (Non-Medical): No  ?Physical Activity: Inactive  ? Days of Exercise per Week: 0 days  ? Minutes of Exercise per Session: 0 min  ?Stress: No Stress Concern Present  ? Feeling of Stress : Not at

## 2021-06-27 NOTE — Patient Instructions (Signed)
[facility] [place]  Discharge Instructions: ?Thank you for choosing [facility] to provide your oncology and hematology care.  ?If you have a lab appointment with the [facility], please go directly to the [facility] and check in at the registration area. ? ?Wear comfortable clothing and clothing appropriate for easy access to any Portacath or PICC line.  ? ?We strive to give you quality time with your provider. You may need to reschedule your appointment if you arrive late (15 or more minutes).  Arriving late affects you and other patients whose appointments are after yours.  Also, if you miss three or more appointments without notifying the office, you may be dismissed from the clinic at the provider?s discretion.    ?  ?For prescription refill requests, have your pharmacy contact our office and allow 72 hours for refills to be completed.   ? ?Today you received the following chemotherapy and/or immunotherapy agents : Keytruda / Taxol / Zometa ?   ?  ?To help prevent nausea and vomiting after your treatment, we encourage you to take your nausea medication as directed. ? ?BELOW ARE SYMPTOMS THAT SHOULD BE REPORTED IMMEDIATELY: ?*FEVER GREATER THAN 100.4 F (38 ?C) OR HIGHER ?*CHILLS OR SWEATING ?*NAUSEA AND VOMITING THAT IS NOT CONTROLLED WITH YOUR NAUSEA MEDICATION ?*UNUSUAL SHORTNESS OF BREATH ?*UNUSUAL BRUISING OR BLEEDING ?*URINARY PROBLEMS (pain or burning when urinating, or frequent urination) ?*BOWEL PROBLEMS (unusual diarrhea, constipation, pain near the anus) ?TENDERNESS IN MOUTH AND THROAT WITH OR WITHOUT PRESENCE OF ULCERS (sore throat, sores in mouth, or a toothache) ?UNUSUAL RASH, SWELLING OR PAIN  ?UNUSUAL VAGINAL DISCHARGE OR ITCHING  ? ?Items with * indicate a potential emergency and should be followed up as soon as possible or go to the Emergency Department if any problems should occur. ? ?Please show the CHEMOTHERAPY ALERT CARD or IMMUNOTHERAPY ALERT  CARD at check-in to the Emergency Department and triage nurse. ? ?Should you have questions after your visit or need to cancel or reschedule your appointment, please contact [facility] [place] AT [place]  [phone] and follow the prompts.  Office hours are 8:00 a.m. to 4:30 p.m. Monday - Friday. Please note that voicemails left after 4:00 p.m. may not be returned until the following business day.  We are closed weekends and major holidays. You have access to a nurse at all times for urgent questions. Please call the main number to the clinic [phone] and follow the prompts. ? ?For any non-urgent questions, you may also contact your provider using MyChart. We now offer e-Visits for anyone 65 and older to request care online for non-urgent symptoms. For details visit [email or web address]. ?  ?Also download the MyChart app! Go to the app store, search "MyChart", open the app, select [place], and log in with your MyChart username and password. ? ?Due to Covid, a mask is required upon entering the hospital/clinic. If you do not have a mask, one will be given to you upon arrival. For doctor visits, patients may have 1 support person aged 81 or older with them. For treatment visits, patients cannot have anyone with them due to current Covid guidelines and our immunocompromised population.  ?

## 2021-06-29 ENCOUNTER — Telehealth: Payer: Self-pay | Admitting: Internal Medicine

## 2021-06-29 NOTE — Telephone Encounter (Signed)
Patient called to see if her Ozempic has been delivered. Patient was advised that office will call her when it comes in. ?

## 2021-06-30 NOTE — Telephone Encounter (Signed)
Noted. Not received.  ?

## 2021-07-04 ENCOUNTER — Inpatient Hospital Stay: Payer: PPO | Admitting: Internal Medicine

## 2021-07-04 ENCOUNTER — Inpatient Hospital Stay: Payer: PPO

## 2021-07-04 ENCOUNTER — Ambulatory Visit (INDEPENDENT_AMBULATORY_CARE_PROVIDER_SITE_OTHER): Payer: PPO | Admitting: *Deleted

## 2021-07-04 ENCOUNTER — Encounter: Payer: Self-pay | Admitting: Internal Medicine

## 2021-07-04 DIAGNOSIS — C50211 Malignant neoplasm of upper-inner quadrant of right female breast: Secondary | ICD-10-CM

## 2021-07-04 DIAGNOSIS — E113559 Type 2 diabetes mellitus with stable proliferative diabetic retinopathy, unspecified eye: Secondary | ICD-10-CM

## 2021-07-04 DIAGNOSIS — Z171 Estrogen receptor negative status [ER-]: Secondary | ICD-10-CM

## 2021-07-04 DIAGNOSIS — N6310 Unspecified lump in the right breast, unspecified quadrant: Secondary | ICD-10-CM

## 2021-07-04 DIAGNOSIS — Z5111 Encounter for antineoplastic chemotherapy: Secondary | ICD-10-CM | POA: Diagnosis not present

## 2021-07-04 LAB — CBC WITH DIFFERENTIAL/PLATELET
Abs Immature Granulocytes: 0.05 10*3/uL (ref 0.00–0.07)
Basophils Absolute: 0.1 10*3/uL (ref 0.0–0.1)
Basophils Relative: 2 %
Eosinophils Absolute: 0.1 10*3/uL (ref 0.0–0.5)
Eosinophils Relative: 3 %
HCT: 33.2 % — ABNORMAL LOW (ref 36.0–46.0)
Hemoglobin: 10.6 g/dL — ABNORMAL LOW (ref 12.0–15.0)
Immature Granulocytes: 1 %
Lymphocytes Relative: 31 %
Lymphs Abs: 1.2 10*3/uL (ref 0.7–4.0)
MCH: 27 pg (ref 26.0–34.0)
MCHC: 31.9 g/dL (ref 30.0–36.0)
MCV: 84.5 fL (ref 80.0–100.0)
Monocytes Absolute: 0.3 10*3/uL (ref 0.1–1.0)
Monocytes Relative: 8 %
Neutro Abs: 2.2 10*3/uL (ref 1.7–7.7)
Neutrophils Relative %: 55 %
Platelets: 225 10*3/uL (ref 150–400)
RBC: 3.93 MIL/uL (ref 3.87–5.11)
RDW: 18.3 % — ABNORMAL HIGH (ref 11.5–15.5)
WBC: 4 10*3/uL (ref 4.0–10.5)
nRBC: 0 % (ref 0.0–0.2)

## 2021-07-04 LAB — COMPREHENSIVE METABOLIC PANEL
ALT: 25 U/L (ref 0–44)
AST: 33 U/L (ref 15–41)
Albumin: 3.8 g/dL (ref 3.5–5.0)
Alkaline Phosphatase: 95 U/L (ref 38–126)
Anion gap: 8 (ref 5–15)
BUN: 9 mg/dL (ref 8–23)
CO2: 23 mmol/L (ref 22–32)
Calcium: 9.3 mg/dL (ref 8.9–10.3)
Chloride: 104 mmol/L (ref 98–111)
Creatinine, Ser: 0.9 mg/dL (ref 0.44–1.00)
GFR, Estimated: >60 mL/min (ref >60)
Glucose, Bld: 149 mg/dL — ABNORMAL HIGH (ref 70–99)
Potassium: 3.7 mmol/L (ref 3.5–5.1)
Sodium: 135 mmol/L (ref 135–145)
Total Bilirubin: 0.6 mg/dL (ref 0.3–1.2)
Total Protein: 7.3 g/dL (ref 6.5–8.1)

## 2021-07-04 MED ORDER — POTASSIUM CHLORIDE CRYS ER 20 MEQ PO TBCR: 20.0000 meq | EXTENDED_RELEASE_TABLET | Freq: Two times a day (BID) | ORAL | 2 refills | Status: DC

## 2021-07-04 MED ORDER — SODIUM CHLORIDE 0.9% FLUSH
10.0000 mL | INTRAVENOUS | Status: DC | PRN
  Administered 2021-07-04: 10 mL via INTRAVENOUS
  Filled 2021-07-04: qty 10

## 2021-07-04 MED ORDER — HEPARIN SOD (PORK) LOCK FLUSH 100 UNIT/ML IV SOLN
500.0000 [IU] | Freq: Once | INTRAVENOUS | Status: AC
  Administered 2021-07-04: 500 [IU] via INTRAVENOUS
  Filled 2021-07-04: qty 5

## 2021-07-04 NOTE — Chronic Care Management (AMB) (Signed)
?Chronic Care Management  ? ?CCM RN Visit Note ? ?07/04/2021 ?Name: Ashlee Cox MRN: [id number] DOB: [birth year] ? ?Subjective: ?Ashlee Cox is a 71 y.o. year old female who is a primary care patient of Tullo, Aris Everts, MD. The care management team was consulted for assistance with disease management and care coordination needs.   ? ?Engaged with patient by telephone for follow up visit in response to provider referral for case management and/or care coordination services.  ? ?Consent to Services:  ?The patient was given information about Chronic Care Management services, agreed to services, and gave verbal consent prior to initiation of services.  Please see initial visit note for detailed documentation.  ? ?Patient agreed to services and verbal consent obtained.  ? ?Assessment: Review of patient past medical history, allergies, medications, health status, including review of consultants reports, laboratory and other test data, was performed as part of comprehensive evaluation and provision of chronic care management services.  ? ?SDOH (Social Determinants of Health) assessments and interventions performed:   ? ?CCM Care Plan ? ?No Known Allergies ? ?Outpatient Encounter Medications as of 07/04/2021  ?Medication Sig Note  ? acetaminophen (TYLENOL) 500 MG tablet Take 500 mg by mouth every 8 (eight) hours as needed for moderate pain.   ? Continuous Blood Gluc Sensor (FREESTYLE LIBRE 3 SENSOR) MISC Apply 1 each topically every 14 (fourteen) days. Place 1 sensor on the skin every 14 days. Use to check glucose continuously   ? diphenhydrAMINE HCl, Sleep, (ZZZQUIL PO) Take 1 tablet by mouth at bedtime as needed.   ? gabapentin (NEURONTIN) 100 MG capsule TAKE 2 CAPSULES BY MOUTH AT BEDTIME   ? glipiZIDE (GLUCOTROL XL) 10 MG 24 hr tablet Take 1 tablet (10 mg total) by mouth daily with breakfast.   ? lidocaine-prilocaine (EMLA) cream Apply 1 application topically as needed. Apply to port and cover with saran wrap  1-2 hours prior to port access   ? losartan-hydrochlorothiazide (HYZAAR) 50-12.5 MG tablet Take 1 tablet by mouth once daily   ? lovastatin (MEVACOR) 40 MG tablet TAKE 1 TABLET BY MOUTH AT BEDTIME   ? metFORMIN (GLUCOPHAGE) 1000 MG tablet TAKE 2 TABLETS BY MOUTH ONCE DAILY WITH  BREAKFAST 05/04/2021: Reports taking 1 tab in morning and 1 tab at bedtime  ? Multiple Vitamin (MULTIVITAMIN) tablet Take 1 tablet by mouth daily.   ? Multiple Vitamins-Minerals (MULTI-VITAMIN GUMMIES) CHEW Chew 1 tablet by mouth 1 day or 1 dose.   ? omeprazole (PRILOSEC) 20 MG capsule Take 1 capsule by mouth once daily   ? Semaglutide,0.25 or 0.'5MG'$ /DOS, (OZEMPIC, 0.25 OR 0.5 MG/DOSE,) 2 MG/1.5ML SOPN Inject 0.5 mg into the skin once a week.   ? [DISCONTINUED] potassium chloride SA (KLOR-CON M) 20 MEQ tablet Take 1 tablet (20 mEq total) by mouth 2 (two) times daily. 05/25/2021: 20 mEq once daily  ? ?No facility-administered encounter medications on file as of 07/04/2021.  ? ? ?Patient Active Problem List  ? Diagnosis Date Noted  ? 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 Acquired trigger finger 05/11/2020  ? Drug-induced polyneuropathy ([place]) 05/07/2020  ? Type 2 diabetes mellitus with stable proliferative retinopathy, without long-term current use of insulin ([place]) 05/07/2020  ? Genetic testing 12/05/2019  ? Family history of ovarian cancer   ? Family history of melanoma   ? Carcinoma of upper-inner quadrant of right breast in female, estrogen receptor negative ([place]) 10/07/2019  ? Neoplasm of uncertain behavior of upper outer quadrant  of female breast, right 09/25/2019  ? Osteopenia after menopause 03/23/2018  ? Constipation 08/04/2017  ? Elevated LFTs 08/05/2016  ? Psoriasis of scalp 12/10/2015  ? Diabetic retinopathy ([place]) 12/08/2015  ? Encounter for preventive health examination 07/26/2012  ? Abnormal EKG 01/12/2011  ? Hypertension 12/27/2010  ? Hyperlipidemia with target LDL less than 100 12/27/2010  ? H/O: rheumatic  fever   ? History of colonoscopy   ? ? ?Conditions to be addressed/monitored:DMII and CANCER ? ?Care Plan : [facility]  General Plan of Care (Adult)  ?Updates made by [name], RN since 07/04/2021 12:00 AM  ?  ? ?Problem: KNOWLEDGE DEFICIT RELATED TO SELF CARE MANAGEMENT OF CHRONIC CONDITIONS   ?Priority: Medium  ?  ? ?Goal: PATIENT WILL WORK WITH CCM TEAM TO INCREASEKNOWLEDGE OF CHRONIC CONDITIONS   ?Start Date: 05/04/2021  ?Expected End Date: 05/04/2021  ?Priority: Medium  ?Note:   ?Current Barriers:  ?Chronic Disease Management support and education needs related to DMII and BREAST CANCER ;  Patient with recurrent breast cancer post right lumpectomy.  Has porta cath and restarting chemotherapy this week.  In great spirits and has family support.  History of diabetes.  Fasting blood sugar was 247 with recent fasting ranges of 170-200.  Was told chemo would increase blood sugars due to steroids.  BP 132/89 with recent ranges of 100-130/50-80's ? ?3/8--continues to feel ok; awaiting next chemo plan.  Does have elevated liver enzymes the doctors are evaluating.  Still in good spirits.  Blood sugars have been elevated.  Fasting blood sugar this morning was 160 with recent ranges of 111-160's, discussed chemo elevating blood sugars.  Discussed CCM Pharm D Catie changing positions and Pharmacy tech [place] assisting with medication assistance application ? ?4/10--reports she is doing well.  Tolerating chemotherapy, without major complications or side effects.  Fasting blood sugar this morning was 121 withh recent ranges of 120's.  Denies any chest pains, SOB, swelling or any major issues or concerns a this time.  Awaiting medication Ozempic to be mailed. ? ?RNCM Clinical Goal(s):  ?Patient will verbalize understanding of plan for management of DMII and BREAST CANCER as evidenced by better glucose ranges and tolerating chemotherapy ?demonstrate improved health management independence as evidenced by decreasing Hgb A1C by 0.3  points and completing course of chemotherapy        through collaboration with RN Care manager, provider, and care team.  ? ?Interventions: ?1:1 collaboration with primary care provider regarding development and update of comprehensive plan of care as evidenced by provider attestation and co-signature ?Inter-disciplinary care team collaboration (see longitudinal plan of care) ?Evaluation of current treatment plan related to  self management and patient's adherence to plan as established by provider ? ? ?Diabetes:  (Status: Goal on track: NO.) Long Term Goal  ? ?Lab Results  ?Component Value Date  ? HGBA1C 9.2 (A) 04/20/2021  ? @ ?Assessed patient's understanding of A1c goal: <7% ?Provided education to patient about basic DM disease process; ?Reviewed medications with patient and discussed importance of medication adherence;        ?Reviewed prescribed diet with patient low carbohydrate; ?Counseled on importance of regular laboratory monitoring as prescribed;        ?Discussed plans with patient for ongoing care management follow up and provided patient with direct contact information for care management team;      ?Provided patient with written educational materials related to hypo and hyperglycemia and importance of correct treatment;       ?  Advised patient, providing education and rationale, to check cbg twice daily and when you have symptoms of low or high blood sugar and record        ?Screening for signs and symptoms of depression related to chronic disease state;        ?Discussed healthier food and drink options ?Discussed what elevates blood sugars ? ?Oncology:  (Goal on Track (progressing): YES.) Long Term Goal  ?Assessment of understanding of oncology diagnosis:  ?Assessed patient understanding of cancer diagnosis and recommended treatment plan ?Reviewed upcoming provider appointments and treatment appointments ?Assessed available transportation to appointments and treatments. Has consistent/reliable  transportation: Yes ?Assessed support system. Has consistent/reliable family or other support: Yes ?Nutrition assessment performed ?Empathy and emotional support provided to patient ? ?Patient Goals/Self-Care

## 2021-07-04 NOTE — Progress Notes (Signed)
[facility] ?CONSULT NOTE ? ?Patient Care Team: ?Tullo, Teresa L, [name] ?Chrystal, Glenn, MD as Referring Physician (Radiation Oncology) ?,  R, MD as Consulting Physician (Internal Medicine) ?Byrnett, Jeffrey W, MD as Consulting Physician (General Surgery) ?[place]bank, Sharon, RN as Registered Nurse ?Tarpley, Farrah D, RN as [facility] Care Management ?Land, Chrystal M, LCSW as Social Worker ? ?CHIEF COMPLAINTS/PURPOSE OF CONSULTATION: Breast cancer ? 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 Byrnett.] July13th Carbo-taxol-AC;  JAN 2022-s/p Lumpectomy & ALND- [ypT1a (2mm ) ypN1 (2/11 LN-positive)]; s/p radiation end of April 2022. ? ?#Early June 2022-Xeloda 1000 mg per metered square [2000] 2 weeks on 1 week of 6-8 cycles; July 11th 2022- STARTING cycle #3-cut down the dose to 1500 mg BID[sec to HFS].  ? ?# JAN 2023- A. SKIN, RIGHT BREAST; PUNCH BIOPSY: [Dr.Byrnett] ?- INVASIVE CARCINOMA WITH DERMAL INFILTRATION AND LYMPHATIC INVASION. ER- weak POSISTIVE [10%]; PR/her2 NEG [IHC- 0]; JAN 20th, 2023- IMPRESSION: ?1. Although the hypermetabolic right axillary node has resolved ?since the prior PET, there is a new hypermetabolic right low ?cervical/supraclavicular node, suspicious for nodal metastasis. ?2. Decrease in right breast hypermetabolism with nonspecific ?overlying skin thickening in the setting of prior lumpectomy. ?3. Low-level hypermetabolism within the T2 vertebral body, ?suspicious for isolated osseous metastasis. This could be confirmed ?with pre and postcontrast thoracic spine MRI. ?4. Hypermetabolic thoracic nodes, slightly progressive. Given ?progression, nodal metastasis are slightly favored over reactive ?etiologies. ?5. Incidental findings, including: Aortic Atherosclerosis ?(ICD10-I70.0). Tiny hiatal hernia. Hepatic steatosis with ?nonspecific caudate lobe  enlargemen ? ?Comment:  ?The carcinoma is morphologically similar to the post-neoadjuvant mammary  ?carcinoma seen in the January 2022 right breast wide excision and lymph  ?Nodes ? ?# FEB 6th-Taxol single agent #1; FEB 13th, 2023- [CPS =15]; Taxol-Keytruda ? ? 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 ? ? ?  ?Carcinoma of upper-inner quadrant of right breast in female, estrogen receptor negative (HCC)  ?10/07/2019 Initial Diagnosis  ? Carcinoma of upper-inner quadrant of right breast in female, estrogen receptor negative (HCC) ?  ?12/04/2019 Genetic Testing  ? Negative genetic testing. No pathogenic variants identified on the [facility] Common Hereditary Cancers Panel + Skin Cancers Panel. The report date is 12/04/2019. ? ?The Common Hereditary Cancers Panel + Skin Cancers Panel offered by [facility] includes sequencing and/or deletion duplication testing of the following 54 genes: APC*, ATM*, AXIN2, BAP1, BARD1, BMPR1A, BRCA1, BRCA2, BRIP1, CDH1, CDK4, CDKN2A (p14ARF), CDKN2A (p16INK4a), CHEK2, CTNNA1, DICER1*, EPCAM*, GREM1*, HOXB13, KIT, ?MEN1*, MITF*, MLH1*, MSH2*, MSH3*, MSH6*, MUTYH, NBN, NF1*, NTHL1, PALB2, PDGFRA, PMS2*, POLD1*, POLE, POT1, PTCH1, PTEN*, RAD50, RAD51C, RAD51D, RB1*, RNF43, SDHA*, SDHB, SDHC*, SDHD, SMAD4, SMARCA4, STK11, SUFU, TP53, TSC1*, TSC2, VHL.  ?  ?05/07/2020 Cancer Staging  ? Staging form: Breast, AJCC 8th Edition ?- Clinical: Stage IIIB (cT2, cN1, cM0, G3, ER-, PR-, HER2-) - Signed by ,  R, MD on 05/07/2020 ? ?  ?04/18/2021 Cancer Staging  ? Staging form: Breast, AJCC 8th Edition ?- Pathologic: Stage IV (pT4d, pN3c, cM1, ER+, PR-, HER2-) - Signed by ,  R, MD on 04/18/2021 ?Nuclear grade: G3 ? ?  ? ? ? ?HISTORY OF PRESENTING ILLNESS: Accompanied by husband.  Walking independently. ? ?Ashlee Cox 71 y.o.  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 ? ?Patient  complains of mild tingling and numbness in extremities.  However patient   is still able to crochet.  No falls. ? ?Patient denies any nausea vomiting.  No abdominal pain. Otherwise patient denies any nausea vomiting abdominal pain.  No fever no chills.  ? ?Complains of fatigue.  ? ?Review of Systems  ?Constitutional:  Positive for malaise/fatigue. Negative for chills, diaphoresis, fever and weight loss.  ?HENT:  Negative for nosebleeds and sore throat.   ?Eyes:  Negative for double vision.  ?Respiratory:  Negative for cough, hemoptysis, sputum production and wheezing.   ?Cardiovascular:  Negative for chest pain, palpitations, orthopnea and leg swelling.  ?Gastrointestinal:  Negative for abdominal pain, blood in stool, constipation and melena.  ?Genitourinary:  Negative for dysuria, frequency and urgency.  ?Musculoskeletal:  Positive for back pain and neck pain. Negative for joint pain.  ?Neurological:  Positive for tingling. Negative for dizziness, focal weakness, weakness and headaches.  ?Endo/Heme/Allergies:  Does not bruise/bleed easily.  ?Psychiatric/Behavioral:  Negative for depression. The patient is not nervous/anxious and does not have insomnia.    ? ?MEDICAL HISTORY:  ?Past Medical History:  ?Diagnosis Date  ? Cancer (HCC) 2021  ? right breast  ? Cyst, breast   ? benign  ? Diabetes mellitus without complication (HCC)   ? Family history of melanoma   ? Family history of ovarian cancer   ? GERD (gastroesophageal reflux disease)   ? H/O: rheumatic fever   ? History of colonoscopy 2010  ? done bc of bleeding,  normal , due back in 5 yrs ([place])  ? Hyperlipidemia   ? Hypertension   ? Menopause   ? at age 52  ? Personal history of chemotherapy 09/2019  ? RIGHT  INVASIVE MAMMARY CARCINOMA  ? Personal history of radiation therapy   ? ? ?SURGICAL HISTORY: ?Past Surgical History:  ?Procedure Laterality Date  ? APPENDECTOMY  2006  ? BREAST BIOPSY Right 09/22/2019  ? INVASIVE MAMMARY CARCINOMA  ? BREAST CYST  ASPIRATION Left 1999  ? BREAST LUMPECTOMY Left 04/23/2020  ? surgery with NL and SN   ? BREAST LUMPECTOMY WITH NEEDLE LOCALIZATION Right 04/23/2020  ? Procedure: BREAST LUMPECTOMY WITH NEEDLE LOCALIZATION;  Surgeon: Byrnett, Jeffrey W, MD;  Location: [facility] ORS;  Service: General;  Laterality: Right;  ? BREAST LUMPECTOMY WITH SENTINEL LYMPH NODE BIOPSY Right 04/23/2020  ? Procedure: BREAST LUMPECTOMY WITH SENTINEL LYMPH NODE BX;  Surgeon: Byrnett, Jeffrey W, MD;  Location: [facility] ORS;  Service: General;  Laterality: Right;  ? CHOLECYSTECTOMY  2006  ? COLONOSCOPY    ? PORTACATH PLACEMENT Left 10/06/2019  ? Procedure: INSERTION PORT-A-CATH;  Surgeon: Byrnett, Jeffrey W, MD;  Location: [facility] ORS;  Service: General;  Laterality: Left;  ? SUBMUCOSAL TATTOO INJECTION Right 10/06/2019  ? Procedure: Right axillary TATTOO INJECTION;  Surgeon: Byrnett, Jeffrey W, MD;  Location: [facility] ORS;  Service: General;  Laterality: Right;  ? VAGINAL DELIVERY    ? x3  ? ? ?SOCIAL HISTORY: ?Social History  ? ?Socioeconomic History  ? Marital status: Married  ?  Spouse name: Kenneth  ? Number of children: 3  ? Years of education: Not on file  ? Highest education level: Not on file  ?Occupational History  ? Occupation: stocker  ?  Employer: [employer]  ?Tobacco Use  ? Smoking status: Former  ?  Years: 1.00  ?  Types: Cigarettes  ?  Quit date: 12/26/2005  ?  Years since quitting: 15.5  ? Smokeless tobacco: Never  ? Tobacco comments:  ?  smoked for less than 1 years,  1 cig/day  ?  Vaping Use  ? Vaping Use: Never used  ?Substance and Sexual Activity  ? Alcohol use: No  ? Drug use: No  ? Sexual activity: Never  ?  Partners: Female  ?Other Topics Concern  ? Not on file  ?Social History Narrative  ? Widowed, March 2014; remarried.  ?   ? [employer] retd; quit smoking 1ppw; no alcohol.   ?   ? ?Social Determinants of Health  ? ?Financial Resource Strain: Medium Risk  ? Difficulty of Paying Living Expenses: Somewhat hard  ?Food Insecurity: No Food Insecurity  ?  Worried About Charity fundraiser in the Last Year: Never true  ? Ran Out of Food in the Last Year: Never true  ?Transportation Needs: No Transportation Needs  ? Lack of Transportation (Medical): No  ? Lack of Transport

## 2021-07-04 NOTE — Progress Notes (Signed)
Lt knee hurts, states it almost came out from under her the other day. Hurts on the back part of the knee. ?

## 2021-07-04 NOTE — Assessment & Plan Note (Addendum)
#  Recurrent right breast cancer-s/p breast skin biopsy-week ER positive /PR HER2 negative- recurrent- stage IV;Apr 15, 2021- PET scan shows hypermetabolic thoracic lymph nodes; T2 vertebral body concerning for metastatic disease; lower level hypermetabolic some cervical/supraclavicular lymph node/right breast hypermetabolic lymph at the level of previous surgery. NGS -FEB 2023- CPS =15%. Currently on Keytruda- taxol [2/13];  ? ?# Proceed with Chemo- cycle #3; day-1 on Taxol on 4/17[at redcued dose of Taxol to 60 mg per metered square given rising LFTs/neutropenia-see below]; Labs today reviewed;  acceptable for treatment today.  Discussed that we will plan to get an imaging in 1 month/ order at next visit.  ? ?# Abnormal LFTs-grade 1- likely sec to taxol- STABLE/improved after the dose of Taxol to 60 mg per metered square. [2/27] ? ?# T2 thoracic metastatic disease [confirmed MRI Jan 2023]-discussed regarding radiation patient wants to hold off for now. S/p  Zometa treatment; discussed.  ? ?# PN-1-2 sec to taxol-on Neurontin 200 qhs-stable ? ?# DM/poorly controlled- PBF- 166; continue metformin 2000 mg q AM [Dr.Tullo]; INCREASE to glipizide 10 mg XL. Continue follow up with  PCP. [FBG- 240; PP- 160]; reviewed the log BGContinue dietary restrictions. On CGM.   ? ?# Hypoklaemia: K- 3.7 Kdur 20 meq/day-refilled.  ? ?# Mediport: Functioning.  ?  ?Key-3week; Taxol-weekx3;1 w-off ? ?# DISPOSITION: ?# No chemo today; de-access the port ?# 1 week- labs- cbc/cmp;taxol; NO MD ?# follow up 2 weeks-- MD; labs- cbc/cmp; taxol-Keytruda Dr.B ? ? ?

## 2021-07-04 NOTE — Patient Instructions (Addendum)
Visit Information ? ?Thank you for taking time to visit with me today. Please don't hesitate to contact me if I can be of assistance to you before our next scheduled telephone appointment. ? ?Following are the goals we discussed today:  ?Take medications as prescribed   ?Attend all scheduled provider appointments ?Call provider office for new concerns or questions  ?check blood sugar at prescribed times: twice daily and when you have symptoms of low or high blood sugar ?enter blood sugar readings and medication or insulin into daily log ?take the blood sugar log to all doctor visits ?set a realistic goal ?keep feet up while sitting ?wash and dry feet carefully every day ?Call provider or this rncm if you do not have Ozempic by this Friday  ? ?Our next appointment is by telephone on 08/08/21 at 1000 ? ?Please call the care guide team at [phone] if you need to cancel or reschedule your appointment.  ? ?If you are experiencing a Mental Health or [place] or need someone to talk to, please call the Suicide and Crisis Lifeline: 988 ?call the [place] National Suicide Prevention Lifeline: (719)079-3649 or TTY: 858-806-9427 TTY 680 408 9546) to talk to a trained counselor ?call 1-800-273-TALK (toll free, 24 hour hotline) ?call 911  ? ?Patient verbalizes understanding of instructions and care plan provided today and agrees to view in [place]. Active MyChart status confirmed with patient.   ? ?Hubert Azure RN, MSN ?RN Care Management Coordinator ?[place] ?[phone] ?[email or web address] ? ?

## 2021-07-06 ENCOUNTER — Telehealth: Payer: Self-pay

## 2021-07-06 NOTE — Telephone Encounter (Signed)
Yes if we have her dose ? ?

## 2021-07-06 NOTE — Telephone Encounter (Signed)
Farrah chronic care management nurse called and stated that she spoke with pt today. Pt stated to Sheppard Evens that she has not received her patient assistance medication yet and she is out of her Ozempic. Would it be okay to give pt a sample to hopefully hold her over till we receive her medication?  ?

## 2021-07-07 NOTE — Telephone Encounter (Signed)
Spoke with pt to let her know that we have a sample that she can come by and pick up. Pt gave a verbal understanding.  ?

## 2021-07-07 NOTE — Telephone Encounter (Signed)
Sample: Ozempic '2mg'$ /1.53m ? ?Lot # [id number]?Exp: 08-25-2023 ? ?1 box ?

## 2021-07-07 NOTE — Telephone Encounter (Signed)
Pt's patient assistance medication came to the office today. Pt is aware that she should pick that up and not the sample. Sample has been added back to sample book.  ? ?Ozempic: 5 boxes (will be picked up by pt) ? ? ?Ozempic: 1 box (has been added back to sample supply)  ?

## 2021-07-07 NOTE — Telephone Encounter (Signed)
Pt has picked up medication.  

## 2021-07-11 ENCOUNTER — Inpatient Hospital Stay: Payer: PPO

## 2021-07-11 ENCOUNTER — Other Ambulatory Visit: Payer: PPO

## 2021-07-11 ENCOUNTER — Other Ambulatory Visit: Payer: Self-pay | Admitting: Internal Medicine

## 2021-07-11 ENCOUNTER — Other Ambulatory Visit: Payer: Self-pay | Admitting: Nurse Practitioner

## 2021-07-11 VITALS — BP 134/75 | HR 90 | Temp 98.6°F | Resp 17 | Wt 187.3 lb

## 2021-07-11 DIAGNOSIS — Z171 Estrogen receptor negative status [ER-]: Secondary | ICD-10-CM

## 2021-07-11 DIAGNOSIS — D4861 Neoplasm of uncertain behavior of right breast: Secondary | ICD-10-CM

## 2021-07-11 DIAGNOSIS — Z5111 Encounter for antineoplastic chemotherapy: Secondary | ICD-10-CM | POA: Diagnosis not present

## 2021-07-11 DIAGNOSIS — G620 Drug-induced polyneuropathy: Secondary | ICD-10-CM

## 2021-07-11 LAB — COMPREHENSIVE METABOLIC PANEL
ALT: 19 U/L (ref 0–44)
AST: 32 U/L (ref 15–41)
Albumin: 3.9 g/dL (ref 3.5–5.0)
Alkaline Phosphatase: 93 U/L (ref 38–126)
Anion gap: 9 (ref 5–15)
BUN: 10 mg/dL (ref 8–23)
CO2: 22 mmol/L (ref 22–32)
Calcium: 9 mg/dL (ref 8.9–10.3)
Chloride: 104 mmol/L (ref 98–111)
Creatinine, Ser: 0.95 mg/dL (ref 0.44–1.00)
GFR, Estimated: >60 mL/min (ref >60)
Glucose, Bld: 177 mg/dL — ABNORMAL HIGH (ref 70–99)
Potassium: 3.8 mmol/L (ref 3.5–5.1)
Sodium: 135 mmol/L (ref 135–145)
Total Bilirubin: 0.5 mg/dL (ref 0.3–1.2)
Total Protein: 7.4 g/dL (ref 6.5–8.1)

## 2021-07-11 LAB — CBC WITH DIFFERENTIAL/PLATELET
Abs Immature Granulocytes: 0.02 10*3/uL (ref 0.00–0.07)
Basophils Absolute: 0.1 10*3/uL (ref 0.0–0.1)
Basophils Relative: 1 %
Eosinophils Absolute: 0.2 10*3/uL (ref 0.0–0.5)
Eosinophils Relative: 3 %
HCT: 34.9 % — ABNORMAL LOW (ref 36.0–46.0)
Hemoglobin: 11.2 g/dL — ABNORMAL LOW (ref 12.0–15.0)
Immature Granulocytes: 0 %
Lymphocytes Relative: 24 %
Lymphs Abs: 1.3 10*3/uL (ref 0.7–4.0)
MCH: 27.1 pg (ref 26.0–34.0)
MCHC: 32.1 g/dL (ref 30.0–36.0)
MCV: 84.3 fL (ref 80.0–100.0)
Monocytes Absolute: 0.5 10*3/uL (ref 0.1–1.0)
Monocytes Relative: 9 %
Neutro Abs: 3.3 10*3/uL (ref 1.7–7.7)
Neutrophils Relative %: 63 %
Platelets: 203 10*3/uL (ref 150–400)
RBC: 4.14 MIL/uL (ref 3.87–5.11)
RDW: 18.4 % — ABNORMAL HIGH (ref 11.5–15.5)
WBC: 5.3 10*3/uL (ref 4.0–10.5)
nRBC: 0 % (ref 0.0–0.2)

## 2021-07-11 MED ORDER — HEPARIN SOD (PORK) LOCK FLUSH 100 UNIT/ML IV SOLN
500.0000 [IU] | Freq: Once | INTRAVENOUS | Status: AC | PRN
  Filled 2021-07-11: qty 5

## 2021-07-11 MED ORDER — SODIUM CHLORIDE 0.9 % IV SOLN
60.0000 mg/m2 | Freq: Once | INTRAVENOUS | Status: AC
  Administered 2021-07-11: 120 mg via INTRAVENOUS
  Filled 2021-07-11: qty 20

## 2021-07-11 MED ORDER — SODIUM CHLORIDE 0.9 % IV SOLN
Freq: Once | INTRAVENOUS | Status: AC
  Filled 2021-07-11: qty 250

## 2021-07-11 MED ORDER — SODIUM CHLORIDE 0.9 % IV SOLN
10.0000 mg | Freq: Once | INTRAVENOUS | Status: AC
  Administered 2021-07-11: 10 mg via INTRAVENOUS
  Filled 2021-07-11: qty 10

## 2021-07-11 MED ORDER — FAMOTIDINE IN NACL 20-0.9 MG/50ML-% IV SOLN
20.0000 mg | Freq: Once | INTRAVENOUS | Status: AC
  Administered 2021-07-11: 20 mg via INTRAVENOUS
  Filled 2021-07-11: qty 50

## 2021-07-11 MED ORDER — HEPARIN SOD (PORK) LOCK FLUSH 100 UNIT/ML IV SOLN
INTRAVENOUS | Status: AC
  Administered 2021-07-11: 500 [IU]
  Filled 2021-07-11: qty 5

## 2021-07-11 MED ORDER — DIPHENHYDRAMINE HCL 50 MG/ML IJ SOLN
50.0000 mg | Freq: Once | INTRAMUSCULAR | Status: AC
  Administered 2021-07-11: 50 mg via INTRAVENOUS
  Filled 2021-07-11: qty 1

## 2021-07-11 NOTE — Patient Instructions (Signed)
[facility]  Discharge Instructions: Thank you for choosing [facility] to provide your oncology and hematology care.  If you have a lab appointment with the [facility], please go directly to the Cancer [facility] and check in at the registration area.  Wear comfortable clothing and clothing appropriate for easy access to any Portacath or PICC line.   We strive to give you quality time with your provider. You may need to reschedule your appointment if you arrive late (15 or more minutes).  Arriving late affects you and other patients whose appointments are after yours.  Also, if you miss three or more appointments without notifying the office, you may be dismissed from the clinic at the provider's discretion.      For prescription refill requests, have your pharmacy contact our office and allow 72 hours for refills to be completed.    Today you received the following chemotherapy and/or immunotherapy agents Taxol      To help prevent nausea and vomiting after your treatment, we encourage you to take your nausea medication as directed.  BELOW ARE SYMPTOMS THAT SHOULD BE REPORTED IMMEDIATELY: *FEVER GREATER THAN 100.4 F (38 C) OR HIGHER *CHILLS OR SWEATING *NAUSEA AND VOMITING THAT IS NOT CONTROLLED WITH YOUR NAUSEA MEDICATION *UNUSUAL SHORTNESS OF BREATH *UNUSUAL BRUISING OR BLEEDING *URINARY PROBLEMS (pain or burning when urinating, or frequent urination) *BOWEL PROBLEMS (unusual diarrhea, constipation, pain near the anus) TENDERNESS IN MOUTH AND THROAT WITH OR WITHOUT PRESENCE OF ULCERS (sore throat, sores in mouth, or a toothache) UNUSUAL RASH, SWELLING OR PAIN  UNUSUAL VAGINAL DISCHARGE OR ITCHING   Items with * indicate a potential emergency and should be followed up as soon as possible or go to the Emergency Department if any problems should occur.  Please show the CHEMOTHERAPY ALERT CARD or IMMUNOTHERAPY ALERT CARD at check-in to the  Emergency Department and triage nurse.  Should you have questions after your visit or need to cancel or reschedule your appointment, please contact [facility]  [phone] and follow the prompts.  Office hours are 8:00 a.m. to 4:30 p.m. Monday - Friday. Please note that voicemails left after 4:00 p.m. may not be returned until the following business day.  We are closed weekends and major holidays. You have access to a nurse at all times for urgent questions. Please call the main number to the clinic [phone] and follow the prompts.  For any non-urgent questions, you may also contact your provider using MyChart. We now offer e-Visits for anyone 18 and older to request care online for non-urgent symptoms. For details visit [email or web address].   Also download the MyChart app! Go to the app store, search "MyChart", open the app, select [facility], and log in with your MyChart username and password.  Due to Covid, a mask is required upon entering the hospital/clinic. If you do not have a mask, one will be given to you upon arrival. For doctor visits, patients may have 1 support person aged 18 or older with them. For treatment visits, patients cannot have anyone with them due to current Covid guidelines and our immunocompromised population.  

## 2021-07-14 ENCOUNTER — Other Ambulatory Visit: Payer: Self-pay | Admitting: *Deleted

## 2021-07-14 DIAGNOSIS — G620 Drug-induced polyneuropathy: Secondary | ICD-10-CM

## 2021-07-15 ENCOUNTER — Encounter: Payer: Self-pay | Admitting: Internal Medicine

## 2021-07-15 MED ORDER — GABAPENTIN 100 MG PO CAPS: 200.0000 mg | ORAL_CAPSULE | Freq: Every day | ORAL | 0 refills | Status: DC

## 2021-07-16 ENCOUNTER — Other Ambulatory Visit: Payer: Self-pay | Admitting: Internal Medicine

## 2021-07-18 ENCOUNTER — Inpatient Hospital Stay: Payer: PPO

## 2021-07-18 ENCOUNTER — Encounter: Payer: Self-pay | Admitting: Internal Medicine

## 2021-07-18 ENCOUNTER — Inpatient Hospital Stay: Payer: PPO | Admitting: Internal Medicine

## 2021-07-18 DIAGNOSIS — Z171 Estrogen receptor negative status [ER-]: Secondary | ICD-10-CM

## 2021-07-18 DIAGNOSIS — D4861 Neoplasm of uncertain behavior of right breast: Secondary | ICD-10-CM

## 2021-07-18 DIAGNOSIS — Z5111 Encounter for antineoplastic chemotherapy: Secondary | ICD-10-CM | POA: Diagnosis not present

## 2021-07-18 DIAGNOSIS — C50211 Malignant neoplasm of upper-inner quadrant of right female breast: Secondary | ICD-10-CM

## 2021-07-18 LAB — CBC WITH DIFFERENTIAL/PLATELET
Abs Immature Granulocytes: 0.17 10*3/uL — ABNORMAL HIGH (ref 0.00–0.07)
Basophils Absolute: 0.1 10*3/uL (ref 0.0–0.1)
Basophils Relative: 1 %
Eosinophils Absolute: 0.2 10*3/uL (ref 0.0–0.5)
Eosinophils Relative: 4 %
HCT: 33.2 % — ABNORMAL LOW (ref 36.0–46.0)
Hemoglobin: 10.8 g/dL — ABNORMAL LOW (ref 12.0–15.0)
Immature Granulocytes: 3 %
Lymphocytes Relative: 23 %
Lymphs Abs: 1.5 10*3/uL (ref 0.7–4.0)
MCH: 27.8 pg (ref 26.0–34.0)
MCHC: 32.5 g/dL (ref 30.0–36.0)
MCV: 85.3 fL (ref 80.0–100.0)
Monocytes Absolute: 0.5 10*3/uL (ref 0.1–1.0)
Monocytes Relative: 7 %
Neutro Abs: 4.1 10*3/uL (ref 1.7–7.7)
Neutrophils Relative %: 62 %
Platelets: 195 10*3/uL (ref 150–400)
RBC: 3.89 MIL/uL (ref 3.87–5.11)
RDW: 18.1 % — ABNORMAL HIGH (ref 11.5–15.5)
WBC: 6.4 10*3/uL (ref 4.0–10.5)
nRBC: 0 % (ref 0.0–0.2)

## 2021-07-18 LAB — COMPREHENSIVE METABOLIC PANEL
ALT: 22 U/L (ref 0–44)
AST: 43 U/L — ABNORMAL HIGH (ref 15–41)
Albumin: 4 g/dL (ref 3.5–5.0)
Alkaline Phosphatase: 91 U/L (ref 38–126)
Anion gap: 4 — ABNORMAL LOW (ref 5–15)
BUN: 13 mg/dL (ref 8–23)
CO2: 22 mmol/L (ref 22–32)
Calcium: 9.7 mg/dL (ref 8.9–10.3)
Chloride: 103 mmol/L (ref 98–111)
Creatinine, Ser: 0.93 mg/dL (ref 0.44–1.00)
GFR, Estimated: >60 mL/min (ref >60)
Glucose, Bld: 181 mg/dL — ABNORMAL HIGH (ref 70–99)
Potassium: 3.8 mmol/L (ref 3.5–5.1)
Sodium: 129 mmol/L — ABNORMAL LOW (ref 135–145)
Total Bilirubin: 0.6 mg/dL (ref 0.3–1.2)
Total Protein: 7.6 g/dL (ref 6.5–8.1)

## 2021-07-18 MED ORDER — FAMOTIDINE IN NACL 20-0.9 MG/50ML-% IV SOLN
20.0000 mg | Freq: Once | INTRAVENOUS | Status: AC
  Administered 2021-07-18: 20 mg via INTRAVENOUS
  Filled 2021-07-18: qty 50

## 2021-07-18 MED ORDER — SODIUM CHLORIDE 0.9 % IV SOLN
60.0000 mg/m2 | Freq: Once | INTRAVENOUS | Status: AC
  Administered 2021-07-18: 120 mg via INTRAVENOUS
  Filled 2021-07-18: qty 20

## 2021-07-18 MED ORDER — HEPARIN SOD (PORK) LOCK FLUSH 100 UNIT/ML IV SOLN
500.0000 [IU] | Freq: Once | INTRAVENOUS | Status: AC
  Administered 2021-07-18: 500 [IU] via INTRAVENOUS
  Filled 2021-07-18: qty 5

## 2021-07-18 MED ORDER — HEPARIN SOD (PORK) LOCK FLUSH 100 UNIT/ML IV SOLN
500.0000 [IU] | Freq: Once | INTRAVENOUS | Status: AC | PRN
  Administered 2021-07-18: 500 [IU]
  Filled 2021-07-18: qty 5

## 2021-07-18 MED ORDER — SODIUM CHLORIDE 0.9 % IV SOLN
Freq: Once | INTRAVENOUS | Status: AC
  Filled 2021-07-18: qty 250

## 2021-07-18 MED ORDER — SODIUM CHLORIDE 0.9 % IV SOLN
10.0000 mg | Freq: Once | INTRAVENOUS | Status: AC
  Administered 2021-07-18: 10 mg via INTRAVENOUS
  Filled 2021-07-18: qty 10

## 2021-07-18 MED ORDER — SODIUM CHLORIDE 0.9% FLUSH
10.0000 mL | Freq: Once | INTRAVENOUS | Status: AC
  Administered 2021-07-18: 10 mL via INTRAVENOUS
  Filled 2021-07-18: qty 10

## 2021-07-18 MED ORDER — SODIUM CHLORIDE 0.9 % IV SOLN
200.0000 mg | Freq: Once | INTRAVENOUS | Status: AC
  Administered 2021-07-18: 200 mg via INTRAVENOUS
  Filled 2021-07-18: qty 8

## 2021-07-18 MED ORDER — DIPHENHYDRAMINE HCL 50 MG/ML IJ SOLN
50.0000 mg | Freq: Once | INTRAMUSCULAR | Status: AC
  Administered 2021-07-18: 50 mg via INTRAVENOUS
  Filled 2021-07-18: qty 1

## 2021-07-18 NOTE — Progress Notes (Signed)
[facility] ?CONSULT NOTE ? ?Patient Care Team: ?Tullo, Teresa L, [name] ?Chrystal, Glenn, MD as Referring Physician (Radiation Oncology) ?Dahlton Hinde R, MD as Consulting Physician (Internal Medicine) ?Byrnett, Jeffrey W, MD as Consulting Physician (General Surgery) ?Brookbank, Sharon, RN as Registered Nurse ?Tarpley, Farrah D, RN as [facility] Care Management ?Land, Chrystal M, LCSW as Social Worker ? ?CHIEF COMPLAINTS/PURPOSE OF CONSULTATION: Breast cancer ? 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 Byrnett.] July13th Carbo-taxol-AC;  JAN 2022-s/p Lumpectomy & ALND- [ypT1a (2mm ) ypN1 (2/11 LN-positive)]; s/p radiation end of April 2022. ? ?#Early June 2022-Xeloda 1000 mg per metered square [2000] 2 weeks on 1 week of 6-8 cycles; July 11th 2022- STARTING cycle #3-cut down the dose to 1500 mg BID[sec to HFS].  ? ?# JAN 2023- A. SKIN, RIGHT BREAST; PUNCH BIOPSY: [Dr.Byrnett] ?- INVASIVE CARCINOMA WITH DERMAL INFILTRATION AND LYMPHATIC INVASION. ER- weak POSISTIVE [10%]; PR/her2 NEG [IHC- 0]; JAN 20th, 2023- IMPRESSION: ?1. Although the hypermetabolic right axillary node has resolved ?since the prior PET, there is a new hypermetabolic right low ?cervical/supraclavicular node, suspicious for nodal metastasis. ?2. Decrease in right breast hypermetabolism with nonspecific ?overlying skin thickening in the setting of prior lumpectomy. ?3. Low-level hypermetabolism within the T2 vertebral body, ?suspicious for isolated osseous metastasis. This could be confirmed ?with pre and postcontrast thoracic spine MRI. ?4. Hypermetabolic thoracic nodes, slightly progressive. Given ?progression, nodal metastasis are slightly favored over reactive ?etiologies. ?5. Incidental findings, including: Aortic Atherosclerosis ?(ICD10-I70.0). Tiny hiatal hernia. Hepatic steatosis with ?nonspecific caudate lobe  enlargemen ? ?Comment:  ?The carcinoma is morphologically similar to the post-neoadjuvant mammary  ?carcinoma seen in the January 2022 right breast wide excision and lymph  ?Nodes ? ?# FEB 6th-Taxol single agent #1; FEB 13th, 2023- [CPS =15]; Taxol-Keytruda ? ? 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 ? ? ?  ?Carcinoma of upper-inner quadrant of right breast in female, estrogen receptor negative (HCC)  ?10/07/2019 Initial Diagnosis  ? Carcinoma of upper-inner quadrant of right breast in female, estrogen receptor negative (HCC) ?  ?12/04/2019 Genetic Testing  ? Negative genetic testing. No pathogenic variants identified on the [facility] Common Hereditary Cancers Panel + Skin Cancers Panel. The report date is 12/04/2019. ? ?The Common Hereditary Cancers Panel + Skin Cancers Panel offered by [facility] includes sequencing and/or deletion duplication testing of the following 54 genes: APC*, ATM*, AXIN2, BAP1, BARD1, BMPR1A, BRCA1, BRCA2, BRIP1, CDH1, CDK4, CDKN2A (p14ARF), CDKN2A (p16INK4a), CHEK2, CTNNA1, DICER1*, EPCAM*, GREM1*, HOXB13, KIT, ?MEN1*, MITF*, MLH1*, MSH2*, MSH3*, MSH6*, MUTYH, NBN, NF1*, NTHL1, PALB2, PDGFRA, PMS2*, POLD1*, POLE, POT1, PTCH1, PTEN*, RAD50, RAD51C, RAD51D, RB1*, RNF43, SDHA*, SDHB, SDHC*, SDHD, SMAD4, SMARCA4, STK11, SUFU, TP53, TSC1*, TSC2, VHL.  ?  ?05/07/2020 Cancer Staging  ? Staging form: Breast, AJCC 8th Edition ?- Clinical: Stage IIIB (cT2, cN1, cM0, G3, ER-, PR-, HER2-) - Signed by Nysa Sarin R, MD on 05/07/2020 ? ?  ?04/18/2021 Cancer Staging  ? Staging form: Breast, AJCC 8th Edition ?- Pathologic: Stage IV (pT4d, pN3c, cM1, ER+, PR-, HER2-) - Signed by Cheridan Kibler R, MD on 04/18/2021 ?Nuclear grade: G3 ? ?  ? ? ? ?HISTORY OF PRESENTING ILLNESS: Accompanied by husband.  Walking independently. ? ?Ashlee Cox 71 y.o.  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 ? ?Patient  complains of mild tingling and numbness in extremities.  However patient   is still able to crochet.  No falls. ? ?Patient admits to resolution of the breast skin rash.  Denies any nausea vomiting abdominal pain. Complains of fatigue.  ? ?Review of Systems  ?Constitutional:  Positive for malaise/fatigue. Negative for chills, diaphoresis, fever and weight loss.  ?HENT:  Negative for nosebleeds and sore throat.   ?Eyes:  Negative for double vision.  ?Respiratory:  Negative for cough, hemoptysis, sputum production and wheezing.   ?Cardiovascular:  Negative for chest pain, palpitations, orthopnea and leg swelling.  ?Gastrointestinal:  Negative for abdominal pain, blood in stool, constipation and melena.  ?Genitourinary:  Negative for dysuria, frequency and urgency.  ?Musculoskeletal:  Positive for back pain and neck pain. Negative for joint pain.  ?Neurological:  Positive for tingling. Negative for dizziness, focal weakness, weakness and headaches.  ?Endo/Heme/Allergies:  Does not bruise/bleed easily.  ?Psychiatric/Behavioral:  Negative for depression. The patient is not nervous/anxious and does not have insomnia.    ? ?MEDICAL HISTORY:  ?Past Medical History:  ?Diagnosis Date  ? Cancer [facility]) 2021  ? right breast  ? Cyst, breast   ? benign  ? Diabetes mellitus without complication ([place])   ? Family history of melanoma   ? Family history of ovarian cancer   ? GERD (gastroesophageal reflux disease)   ? H/O: rheumatic fever   ? History of colonoscopy 2010  ? 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 Hyperlipidemia   ? Hypertension   ? Menopause   ? at age 45  ? Personal history of chemotherapy 09/2019  ? RIGHT  INVASIVE MAMMARY CARCINOMA  ? Personal history of radiation therapy   ? ? ?SURGICAL HISTORY: ?Past Surgical History:  ?Procedure Laterality Date  ? APPENDECTOMY  2006  ? BREAST BIOPSY Right 09/22/2019  ? INVASIVE MAMMARY CARCINOMA  ? BREAST CYST ASPIRATION Left 1999  ? BREAST LUMPECTOMY Left  04/23/2020  ? surgery with NL and SN   ? BREAST LUMPECTOMY WITH NEEDLE LOCALIZATION Right 04/23/2020  ? Procedure: BREAST LUMPECTOMY WITH NEEDLE LOCALIZATION;  Surgeon: Robert Bellow, MD;  Location: [facility] ORS;  Service: General;  Laterality: Right;  ? BREAST LUMPECTOMY WITH SENTINEL LYMPH NODE BIOPSY Right 04/23/2020  ? Procedure: BREAST LUMPECTOMY WITH SENTINEL LYMPH NODE BX;  Surgeon: Robert Bellow, MD;  Location: [facility] ORS;  Service: General;  Laterality: Right;  ? CHOLECYSTECTOMY  2006  ? COLONOSCOPY    ? PORTACATH PLACEMENT Left 10/06/2019  ? Procedure: INSERTION PORT-A-CATH;  Surgeon: Robert Bellow, MD;  Location: [facility] ORS;  Service: General;  Laterality: Left;  ? SUBMUCOSAL TATTOO INJECTION Right 10/06/2019  ? Procedure: Right axillary TATTOO INJECTION;  Surgeon: Robert Bellow, MD;  Location: [facility] ORS;  Service: General;  Laterality: Right;  ? VAGINAL DELIVERY    ? x3  ? ? ?SOCIAL HISTORY: ?Social History  ? ?Socioeconomic History  ? Marital status: Married  ?  Spouse name: Chrissie Noa  ? Number of children: 3  ? Years of education: Not on file  ? Highest education level: Not on file  ?Occupational History  ? Occupation: stocker  ?  Employer: [id number]  ?Tobacco Use  ? Smoking status: Former  ?  Years: 1.00  ?  Types: Cigarettes  ?  Quit date: 12/26/2005  ?  Years since quitting: 15.5  ? Smokeless tobacco: Never  ? Tobacco comments:  ?  smoked for less than 1 years,  1 cig/day  ?Vaping Use  ? Vaping Use: Never used  ?  Substance and Sexual Activity  ? Alcohol use: No  ? Drug use: No  ? Sexual activity: Never  ?  Partners: Female  ?Other Topics Concern  ? Not on file  ?Social History Narrative  ? Widowed, March 2014; remarried.  ?   ? [employer] retd; quit smoking 1ppw; no alcohol.   ?   ? ?Social Determinants of Health  ? ?Financial Resource Strain: Medium Risk  ? Difficulty of Paying Living Expenses: Somewhat hard  ?Food Insecurity: No Food Insecurity  ? Worried About Charity fundraiser in the Last Year:  Never true  ? Ran Out of Food in the Last Year: Never true  ?Transportation Needs: No Transportation Needs  ? Lack of Transportation (Medical): No  ? Lack of Transportation (Non-Medical): No  ?Physical Activity:

## 2021-07-18 NOTE — Assessment & Plan Note (Addendum)
#  Recurrent right breast cancer-s/p breast skin biopsy-week ER positive /PR HER2 negative- recurrent- stage IV;Apr 15, 2021- PET scan shows hypermetabolic thoracic lymph nodes; T2 vertebral body concerning for metastatic disease; lower level hypermetabolic some cervical/supraclavicular lymph node/right breast hypermetabolic lymph at the level of previous surgery. NGS -FEB 2023- CPS =15%. Currently on Keytruda- taxol [2/13];  ? ?# Proceed with Chemo- cycle #3; day-8 [at redcued dose of Taxol to 60 mg per metered square; Labs today reviewed;  acceptable for treatment today.  Discussed that we will plan to get an imaging in 2 weeks; ordered today..  ? ?# Abnormal LFTs-grade 1- likely sec to taxol- STABLE/improved after the dose of Taxol to 60 mg per metered square. [2/27]- stable ? ?# T2 thoracic metastatic disease [confirmed MRI Jan 2023]-discussed regarding radiation patient wants to hold off for now. S/p  Zometa treatment- STABLE.  ? ?# Hyponatremia: Sodium 129- recommend inreasing fluid intake.  ? ?# PN-1-2 sec to taxol-on Neurontin 200 qhs- ? ?# DM/poorly controlled- PBF- 188; continue metformin 2000 mg q AM [Dr.Tullo]; on glipizide 10 mg XL. Continue follow up with  PCP. [FBG- 240; PP- 160]; reviewed the log BGContinue dietary restrictions. On CGM.   ? ?# Hypoklaemia: K- 3.7 Kdur 20 meq/day-refilled.  ? ?# Mediport: Functioning.  ?  ?Key-3week; Taxol-weekx3;1 w-off ? ?# DISPOSITION: ?# chemo today- await on CMP ?# 1 week- labs- cbc/cmp;taxol; NO MD ?# follow up 3 weeks-- MD; labs- cbc/cmp;TSH; taxol-Keytruda; CT Neck;CAP- Dr.B ? ? ?

## 2021-07-18 NOTE — Patient Instructions (Signed)
[facility] AT [place]  Discharge Instructions: ?Thank you for choosing [facility] to provide your oncology and hematology care.  ?If you have a lab appointment with the [facility], please go directly to the [facility] and check in at the registration area. ? ?Wear comfortable clothing and clothing appropriate for easy access to any Portacath or PICC line.  ? ?We strive to give you quality time with your provider. You may need to reschedule your appointment if you arrive late (15 or more minutes).  Arriving late affects you and other patients whose appointments are after yours.  Also, if you miss three or more appointments without notifying the office, you may be dismissed from the clinic at the provider?s discretion.    ?  ?For prescription refill requests, have your pharmacy contact our office and allow 72 hours for refills to be completed.   ? ?Today you received the following chemotherapy and/or immunotherapy agents: Keytruda, Taxol    ?  ?To help prevent nausea and vomiting after your treatment, we encourage you to take your nausea medication as directed. ? ?BELOW ARE SYMPTOMS THAT SHOULD BE REPORTED IMMEDIATELY: ?*FEVER GREATER THAN 100.4 F (38 ?C) OR HIGHER ?*CHILLS OR SWEATING ?*NAUSEA AND VOMITING THAT IS NOT CONTROLLED WITH YOUR NAUSEA MEDICATION ?*UNUSUAL SHORTNESS OF BREATH ?*UNUSUAL BRUISING OR BLEEDING ?*URINARY PROBLEMS (pain or burning when urinating, or frequent urination) ?*BOWEL PROBLEMS (unusual diarrhea, constipation, pain near the anus) ?TENDERNESS IN MOUTH AND THROAT WITH OR WITHOUT PRESENCE OF ULCERS (sore throat, sores in mouth, or a toothache) ?UNUSUAL RASH, SWELLING OR PAIN  ?UNUSUAL VAGINAL DISCHARGE OR ITCHING  ? ?Items with * indicate a potential emergency and should be followed up as soon as possible or go to the Emergency Department if any problems should occur. ? ?Please show the CHEMOTHERAPY ALERT CARD or IMMUNOTHERAPY ALERT CARD at  check-in to the Emergency Department and triage nurse. ? ?Should you have questions after your visit or need to cancel or reschedule your appointment, please contact [facility] [place] AT [place]  [phone] and follow the prompts.  Office hours are 8:00 a.m. to 4:30 p.m. Monday - Friday. Please note that voicemails left after 4:00 p.m. may not be returned until the following business day.  We are closed weekends and major holidays. You have access to a nurse at all times for urgent questions. Please call the main number to the clinic [phone] and follow the prompts. ? ?For any non-urgent questions, you may also contact your provider using MyChart. We now offer e-Visits for anyone 7 and older to request care online for non-urgent symptoms. For details visit [email or web address]. ?  ?Also download the MyChart app! Go to the app store, search "MyChart", open the app, select [place], and log in with your MyChart username and password. ? ?Due to Covid, a mask is required upon entering the hospital/clinic. If you do not have a mask, one will be given to you upon arrival. For doctor visits, patients may have 1 support person aged 39 or older with them. For treatment visits, patients cannot have anyone with them due to current Covid guidelines and our immunocompromised population.  ?

## 2021-07-24 DIAGNOSIS — Z171 Estrogen receptor negative status [ER-]: Secondary | ICD-10-CM

## 2021-07-24 DIAGNOSIS — E113559 Type 2 diabetes mellitus with stable proliferative diabetic retinopathy, unspecified eye: Secondary | ICD-10-CM

## 2021-07-24 DIAGNOSIS — C50211 Malignant neoplasm of upper-inner quadrant of right female breast: Secondary | ICD-10-CM

## 2021-07-25 ENCOUNTER — Inpatient Hospital Stay: Payer: PPO | Attending: Internal Medicine

## 2021-07-25 ENCOUNTER — Inpatient Hospital Stay: Payer: PPO

## 2021-07-25 VITALS — BP 122/74 | HR 74 | Temp 98.0°F | Resp 18 | Wt 187.5 lb

## 2021-07-25 DIAGNOSIS — Z171 Estrogen receptor negative status [ER-]: Secondary | ICD-10-CM

## 2021-07-25 DIAGNOSIS — E039 Hypothyroidism, unspecified: Secondary | ICD-10-CM | POA: Diagnosis not present

## 2021-07-25 DIAGNOSIS — Z510 Encounter for antineoplastic radiation therapy: Secondary | ICD-10-CM | POA: Diagnosis not present

## 2021-07-25 DIAGNOSIS — Z5112 Encounter for antineoplastic immunotherapy: Secondary | ICD-10-CM | POA: Diagnosis not present

## 2021-07-25 DIAGNOSIS — C7951 Secondary malignant neoplasm of bone: Secondary | ICD-10-CM | POA: Insufficient documentation

## 2021-07-25 DIAGNOSIS — Z79899 Other long term (current) drug therapy: Secondary | ICD-10-CM | POA: Insufficient documentation

## 2021-07-25 DIAGNOSIS — Z170 Estrogen receptor positive status [ER+]: Secondary | ICD-10-CM | POA: Diagnosis not present

## 2021-07-25 DIAGNOSIS — Z5111 Encounter for antineoplastic chemotherapy: Secondary | ICD-10-CM | POA: Insufficient documentation

## 2021-07-25 DIAGNOSIS — D4861 Neoplasm of uncertain behavior of right breast: Secondary | ICD-10-CM

## 2021-07-25 DIAGNOSIS — C50211 Malignant neoplasm of upper-inner quadrant of right female breast: Secondary | ICD-10-CM | POA: Insufficient documentation

## 2021-07-25 LAB — COMPREHENSIVE METABOLIC PANEL
ALT: 21 U/L (ref 0–44)
AST: 37 U/L (ref 15–41)
Albumin: 4 g/dL (ref 3.5–5.0)
Alkaline Phosphatase: 89 U/L (ref 38–126)
Anion gap: 12 (ref 5–15)
BUN: 13 mg/dL (ref 8–23)
CO2: 23 mmol/L (ref 22–32)
Calcium: 9.8 mg/dL (ref 8.9–10.3)
Chloride: 100 mmol/L (ref 98–111)
Creatinine, Ser: 0.84 mg/dL (ref 0.44–1.00)
GFR, Estimated: >60 mL/min (ref >60)
Glucose, Bld: 200 mg/dL — ABNORMAL HIGH (ref 70–99)
Potassium: 3.7 mmol/L (ref 3.5–5.1)
Sodium: 135 mmol/L (ref 135–145)
Total Bilirubin: 0.6 mg/dL (ref 0.3–1.2)
Total Protein: 7.6 g/dL (ref 6.5–8.1)

## 2021-07-25 LAB — CBC WITH DIFFERENTIAL/PLATELET
Abs Immature Granulocytes: 0.07 10*3/uL (ref 0.00–0.07)
Basophils Absolute: 0.1 10*3/uL (ref 0.0–0.1)
Basophils Relative: 2 %
Eosinophils Absolute: 0.2 10*3/uL (ref 0.0–0.5)
Eosinophils Relative: 3 %
HCT: 32.1 % — ABNORMAL LOW (ref 36.0–46.0)
Hemoglobin: 10.6 g/dL — ABNORMAL LOW (ref 12.0–15.0)
Immature Granulocytes: 1 %
Lymphocytes Relative: 29 %
Lymphs Abs: 1.4 10*3/uL (ref 0.7–4.0)
MCH: 28 pg (ref 26.0–34.0)
MCHC: 33 g/dL (ref 30.0–36.0)
MCV: 84.7 fL (ref 80.0–100.0)
Monocytes Absolute: 0.3 10*3/uL (ref 0.1–1.0)
Monocytes Relative: 7 %
Neutro Abs: 2.8 10*3/uL (ref 1.7–7.7)
Neutrophils Relative %: 58 %
Platelets: 218 10*3/uL (ref 150–400)
RBC: 3.79 MIL/uL — ABNORMAL LOW (ref 3.87–5.11)
RDW: 19 % — ABNORMAL HIGH (ref 11.5–15.5)
WBC: 4.9 10*3/uL (ref 4.0–10.5)
nRBC: 0 % (ref 0.0–0.2)

## 2021-07-25 MED ORDER — HEPARIN SOD (PORK) LOCK FLUSH 100 UNIT/ML IV SOLN
500.0000 [IU] | Freq: Once | INTRAVENOUS | Status: AC | PRN
  Filled 2021-07-25: qty 5

## 2021-07-25 MED ORDER — ZOLEDRONIC ACID 4 MG/100ML IV SOLN
4.0000 mg | Freq: Once | INTRAVENOUS | Status: AC
  Administered 2021-07-25: 4 mg via INTRAVENOUS
  Filled 2021-07-25: qty 100

## 2021-07-25 MED ORDER — SODIUM CHLORIDE 0.9 % IV SOLN
10.0000 mg | Freq: Once | INTRAVENOUS | Status: AC
  Administered 2021-07-25: 10 mg via INTRAVENOUS
  Filled 2021-07-25: qty 10

## 2021-07-25 MED ORDER — SODIUM CHLORIDE 0.9 % IV SOLN
60.0000 mg/m2 | Freq: Once | INTRAVENOUS | Status: AC
  Administered 2021-07-25: 120 mg via INTRAVENOUS
  Filled 2021-07-25: qty 20

## 2021-07-25 MED ORDER — DIPHENHYDRAMINE HCL 50 MG/ML IJ SOLN
50.0000 mg | Freq: Once | INTRAMUSCULAR | Status: AC
  Administered 2021-07-25: 50 mg via INTRAVENOUS
  Filled 2021-07-25: qty 1

## 2021-07-25 MED ORDER — FAMOTIDINE IN NACL 20-0.9 MG/50ML-% IV SOLN
20.0000 mg | Freq: Once | INTRAVENOUS | Status: AC
  Administered 2021-07-25: 20 mg via INTRAVENOUS
  Filled 2021-07-25: qty 50

## 2021-07-25 MED ORDER — HEPARIN SOD (PORK) LOCK FLUSH 100 UNIT/ML IV SOLN
INTRAVENOUS | Status: AC
  Administered 2021-07-25: 500 [IU]
  Filled 2021-07-25: qty 5

## 2021-07-25 MED ORDER — SODIUM CHLORIDE 0.9 % IV SOLN
Freq: Once | INTRAVENOUS | Status: AC
  Filled 2021-07-25: qty 250

## 2021-07-25 NOTE — Patient Instructions (Signed)
[facility] AT [place]  Discharge Instructions: Thank you for choosing [facility] to provide your oncology and hematology care.  If you have a lab appointment with the Cancer Center, please go directly to the Cancer Center and check in at the registration area.  Wear comfortable clothing and clothing appropriate for easy access to any Portacath or PICC line.   We strive to give you quality time with your provider. You may need to reschedule your appointment if you arrive late (15 or more minutes).  Arriving late affects you and other patients whose appointments are after yours.  Also, if you miss three or more appointments without notifying the office, you may be dismissed from the clinic at the provider's discretion.      For prescription refill requests, have your pharmacy contact our office and allow 72 hours for refills to be completed.    Today you received the following chemotherapy and/or immunotherapy agents Taxol      To help prevent nausea and vomiting after your treatment, we encourage you to take your nausea medication as directed.  BELOW ARE SYMPTOMS THAT SHOULD BE REPORTED IMMEDIATELY: *FEVER GREATER THAN 100.4 F (38 C) OR HIGHER *CHILLS OR SWEATING *NAUSEA AND VOMITING THAT IS NOT CONTROLLED WITH YOUR NAUSEA MEDICATION *UNUSUAL SHORTNESS OF BREATH *UNUSUAL BRUISING OR BLEEDING *URINARY PROBLEMS (pain or burning when urinating, or frequent urination) *BOWEL PROBLEMS (unusual diarrhea, constipation, pain near the anus) TENDERNESS IN MOUTH AND THROAT WITH OR WITHOUT PRESENCE OF ULCERS (sore throat, sores in mouth, or a toothache) UNUSUAL RASH, SWELLING OR PAIN  UNUSUAL VAGINAL DISCHARGE OR ITCHING   Items with * indicate a potential emergency and should be followed up as soon as possible or go to the Emergency Department if any problems should occur.  Please show the CHEMOTHERAPY ALERT CARD or IMMUNOTHERAPY ALERT CARD at check-in to the  Emergency Department and triage nurse.  Should you have questions after your visit or need to cancel or reschedule your appointment, please contact [facility] [place]  [phone] and follow the prompts.  Office hours are 8:00 a.m. to 4:30 p.m. Monday - Friday. Please note that voicemails left after 4:00 p.m. may not be returned until the following business day.  We are closed weekends and major holidays. You have access to a nurse at all times for urgent questions. Please call the main number to the clinic [phone] and follow the prompts.  For any non-urgent questions, you may also contact your provider using MyChart. We now offer e-Visits for anyone 18 and older to request care online for non-urgent symptoms. For details visit [email or web address].   Also download the MyChart app! Go to the app store, search "MyChart", open the app, select [place], and log in with your MyChart username and password.  Due to Covid, a mask is required upon entering the hospital/clinic. If you do not have a mask, one will be given to you upon arrival. For doctor visits, patients may have 1 support person aged 18 or older with them. For treatment visits, patients cannot have anyone with them due to current Covid guidelines and our immunocompromised population.  

## 2021-07-26 ENCOUNTER — Ambulatory Visit
Admission: RE | Admit: 2021-07-26 | Discharge: 2021-07-26 | Disposition: A | Discharge status: Home / Self Care | Payer: PPO | Source: Ambulatory Visit | Attending: Internal Medicine | Admitting: Internal Medicine

## 2021-07-26 DIAGNOSIS — K7460 Unspecified cirrhosis of liver: Secondary | ICD-10-CM | POA: Diagnosis not present

## 2021-07-26 DIAGNOSIS — K3189 Other diseases of stomach and duodenum: Secondary | ICD-10-CM | POA: Diagnosis not present

## 2021-07-26 DIAGNOSIS — J984 Other disorders of lung: Secondary | ICD-10-CM | POA: Diagnosis not present

## 2021-07-26 DIAGNOSIS — C50211 Malignant neoplasm of upper-inner quadrant of right female breast: Secondary | ICD-10-CM | POA: Insufficient documentation

## 2021-07-26 DIAGNOSIS — Z171 Estrogen receptor negative status [ER-]: Secondary | ICD-10-CM | POA: Diagnosis not present

## 2021-07-26 DIAGNOSIS — K449 Diaphragmatic hernia without obstruction or gangrene: Secondary | ICD-10-CM | POA: Diagnosis not present

## 2021-07-26 DIAGNOSIS — M4317 Spondylolisthesis, lumbosacral region: Secondary | ICD-10-CM | POA: Diagnosis not present

## 2021-07-26 DIAGNOSIS — C50919 Malignant neoplasm of unspecified site of unspecified female breast: Secondary | ICD-10-CM | POA: Diagnosis not present

## 2021-07-26 DIAGNOSIS — R221 Localized swelling, mass and lump, neck: Secondary | ICD-10-CM | POA: Diagnosis not present

## 2021-07-26 MED ORDER — IOHEXOL 300 MG/ML  SOLN
100.0000 mL | Freq: Once | INTRAMUSCULAR | Status: AC | PRN
  Administered 2021-07-26: 100 mL via INTRAVENOUS

## 2021-08-04 ENCOUNTER — Ambulatory Visit (INDEPENDENT_AMBULATORY_CARE_PROVIDER_SITE_OTHER): Payer: PPO

## 2021-08-04 VITALS — Ht 67.0 in | Wt 187.0 lb

## 2021-08-04 DIAGNOSIS — Z0000 Encounter for general adult medical examination without abnormal findings: Secondary | ICD-10-CM | POA: Diagnosis not present

## 2021-08-04 NOTE — Patient Instructions (Addendum)
?  Ashlee Cox , ?Thank you for taking time to come for your Medicare Wellness Visit. I appreciate your ongoing commitment to your health goals. Please review the following plan we discussed and let me know if I can assist you in the future.  ? ?These are the goals we discussed: ? Goals   ? ?  Follow up with Primary Care Provider   ?  As needed ?  ? ?  ?  ?This is a list of the screening recommended for you and due dates:  ?Health Maintenance  ?Topic Date Due  ? COVID-19 Vaccine (3 - Pfizer risk series) 08/20/2021*  ? Zoster (Shingles) Vaccine (1 of 2) 12/25/2021*  ? Complete foot exam   10/13/2021  ? Hemoglobin A1C  10/18/2021  ? Flu Shot  10/25/2021  ? Mammogram  11/17/2021  ? Eye exam for diabetics  12/02/2021  ? Tetanus Vaccine  07/27/2022  ? Cologuard (Stool DNA test)  01/26/2024  ? Pneumonia Vaccine  Completed  ? DEXA scan (bone density measurement)  Completed  ? Hepatitis C Screening: USPSTF Recommendation to screen - Ages 41-79 yo.  Completed  ? HPV Vaccine  Aged Out  ? Colon Cancer Screening  Discontinued  ?*Topic was postponed. The date shown is not the original due date.  ?  ?

## 2021-08-04 NOTE — Progress Notes (Addendum)
Subjective:   Ashlee Cox is a 71 y.o. female who presents for an Initial Medicare Annual Wellness Visit.  Review of Systems    No ROS.  Medicare Wellness Virtual Visit.  Visual/audio telehealth visit, UTA vital signs.   See social history for additional risk factors.   Cardiac Risk Factors include: advanced age (>34men, >12 women);diabetes mellitus     Objective:    Today's Vitals   08/04/21 1046  Weight: 187 lb (84.8 kg)  Height: 5\' 7"  (1.702 m)   Body mass index is 29.29 kg/m.     08/04/2021   10:55 AM 07/18/2021    8:31 AM 07/04/2021   12:51 PM 06/27/2021    9:43 AM 06/20/2021    8:38 AM 06/20/2021    8:28 AM 06/06/2021    8:42 AM  Advanced Directives  Does Patient Have a Medical Advance Directive? No No No No No No No  Does patient want to make changes to medical advance directive? No - Patient declined No - Patient declined No - Patient declined No - Patient declined No - Patient declined No - Patient declined Yes (MAU/Ambulatory/Procedural Areas - Information given)  Would patient like information on creating a medical advance directive?      No - Patient declined No - Patient declined;Yes (MAU/Ambulatory/Procedural Areas - Information given)    Current Medications (verified) Outpatient Encounter Medications as of 08/04/2021  Medication Sig   acetaminophen (TYLENOL) 500 MG tablet Take 500 mg by mouth every 8 (eight) hours as needed for moderate pain.   calcium carbonate (OS-CAL) 600 MG TABS tablet Take 600 mg by mouth daily.   Continuous Blood Gluc Sensor (FREESTYLE LIBRE 3 SENSOR) MISC Apply 1 each topically every 14 (fourteen) days. Place 1 sensor on the skin every 14 days. Use to check glucose continuously   diphenhydrAMINE HCl, Sleep, (ZZZQUIL PO) Take 1 tablet by mouth at bedtime as needed.   gabapentin (NEURONTIN) 100 MG capsule Take 2 capsules (200 mg total) by mouth at bedtime.   glipiZIDE (GLUCOTROL XL) 10 MG 24 hr tablet Take 1 tablet (10 mg total) by  mouth daily with breakfast.   lidocaine-prilocaine (EMLA) cream Apply 1 application topically as needed. Apply to port and cover with saran wrap 1-2 hours prior to port access   losartan-hydrochlorothiazide (HYZAAR) 50-12.5 MG tablet Take 1 tablet by mouth once daily   lovastatin (MEVACOR) 40 MG tablet TAKE 1 TABLET BY MOUTH AT BEDTIME   metFORMIN (GLUCOPHAGE) 1000 MG tablet TAKE 2 TABLETS BY MOUTH ONCE DAILY WITH BREAKFAST   Multiple Vitamin (MULTIVITAMIN) tablet Take 1 tablet by mouth daily.   Multiple Vitamins-Minerals (MULTI-VITAMIN GUMMIES) CHEW Chew 1 tablet by mouth 1 day or 1 dose.   omeprazole (PRILOSEC) 20 MG capsule Take 1 capsule by mouth once daily   potassium chloride SA (KLOR-CON M) 20 MEQ tablet Take 1 tablet (20 mEq total) by mouth 2 (two) times daily.   Semaglutide,0.25 or 0.5MG /DOS, (OZEMPIC, 0.25 OR 0.5 MG/DOSE,) 2 MG/1.5ML SOPN Inject 0.5 mg into the skin once a week.   No facility-administered encounter medications on file as of 08/04/2021.    Allergies (verified) Patient has no known allergies.   History: Past Medical History:  Diagnosis Date   Cancer (HCC) 2021   right breast   Cyst, breast    benign   Diabetes mellitus without complication (HCC)    Family history of melanoma    Family history of ovarian cancer    GERD (gastroesophageal reflux disease)  H/O: rheumatic fever    History of colonoscopy 2010   done bc of bleeding,  normal , due back in 5 yrs ([facility])   Hyperlipidemia    Hypertension    Menopause    at age 63   Personal history of chemotherapy 09/2019   RIGHT  INVASIVE MAMMARY CARCINOMA   Personal history of radiation therapy    Past Surgical History:  Procedure Laterality Date   APPENDECTOMY  2006   BREAST BIOPSY Right 09/22/2019   INVASIVE MAMMARY CARCINOMA   BREAST CYST ASPIRATION Left 1999   BREAST LUMPECTOMY Left 04/23/2020   surgery with NL and SN    BREAST LUMPECTOMY WITH NEEDLE LOCALIZATION Right 04/23/2020   Procedure:  BREAST LUMPECTOMY WITH NEEDLE LOCALIZATION;  Surgeon: Earline Mayotte, MD;  Location: [facility] ORS;  Service: General;  Laterality: Right;   BREAST LUMPECTOMY WITH SENTINEL LYMPH NODE BIOPSY Right 04/23/2020   Procedure: BREAST LUMPECTOMY WITH SENTINEL LYMPH NODE BX;  Surgeon: Earline Mayotte, MD;  Location: [facility] ORS;  Service: General;  Laterality: Right;   CHOLECYSTECTOMY  2006   COLONOSCOPY     PORTACATH PLACEMENT Left 10/06/2019   Procedure: INSERTION PORT-A-CATH;  Surgeon: Earline Mayotte, MD;  Location: [facility] OR[facility];  Service: General;  Laterality: Left;   SUBMUCOSAL TATTOO INJECTION Right 10/06/2019   Procedure: Right axillary TATTOO INJECTION;  Surgeon: Earline Mayotte, MD;  Location: [facility] OR[facility];  Service: General;  Laterality: Right;   VAGINAL DELIVERY     x3   Family History  Problem Relation Age of Onset   Cancer Mother 69       ovarian- died 4-5 years.    Heart disease Mother 57   Diabetes Mother    Stroke Father 40   Diabetes Father    Diabetes Sister    Melanoma Sister        survived   Breast cancer Neg Hx    Social History   Socioeconomic History   Marital status: Married    Spouse name: Iantha Fallen   Number of children: 3   Years of education: Not on file   Highest education level: Not on file  Occupational History   Occupation: Holiday representative: [employer]  Tobacco Use   Smoking status: Former    Years: 1.00    Types: Cigarettes    Quit date: 12/26/2005    Years since quitting: 15.6   Smokeless tobacco: Never   Tobacco comments:    smoked for less than 1 years,  1 cig/day  Vaping Use   Vaping Use: Never used  Substance and Sexual Activity   Alcohol use: No   Drug use: No   Sexual activity: Never    Partners: Female  Other Topics Concern   Not on file  Social History Narrative   Widowed, March 2014; remarried.      [employer] ret[employer]; quit smoking 1ppw; no alcohol.       Social Determinants of Health   Financial Resource Strain: Medium Risk    Difficulty of Paying Living Expenses: Somewhat hard  Food Insecurity: No Food Insecurity   Worried About Programme researcher, broadcasting/film/video in the Last Year: Never true   Ran Out of Food in the Last Year: Never true  Transportation Needs: No Transportation Needs   Lack of Transportation (Medical): No   Lack of Transportation (Non-Medical): No  Physical Activity: Inactive   Days of Exercise per Week: 0 days   Minutes of Exercise per Session: 0 min  Stress: No Stress Concern Present   Feeling of Stress : Not at all  Social Connections: Socially Integrated   Frequency of Communication with Friends and Family: More than three times a week   Frequency of Social Gatherings with Friends and Family: More than three times a week   Attends Religious Services: More than 4 times per year   Active Member of [employer] or Organizations: Yes   Attends Engineer, structural: More than 4 times per year   Marital Status: Married    Tobacco Counseling Counseling given: Not Answered Tobacco comments: smoked for less than 1 years,  1 cig/day   Clinical Intake:  Pre-visit preparation completed: Yes        Diabetes: Yes (Followed by PCP)  How often do you need to have someone help you when you read instructions, pamphlets, or other written materials from your doctor or pharmacy?: 1 - Never   Interpreter Needed?: No      Activities of Daily Living    08/04/2021   10:52 AM  In your present state of health, do you have any difficulty performing the following activities:  Hearing? 0  Vision? 0  Difficulty concentrating or making decisions? 0  Walking or climbing stairs? 0  Dressing or bathing? 0  Doing errands, shopping? 0  Preparing Food and eating ? N  Using the Toilet? N  In the past six months, have you accidently leaked urine? N  Do you have problems with loss of bowel control? N  Managing your Medications? N  Managing your Finances? N  Housekeeping or managing your Housekeeping? N     Patient Care Team: Sherlene Shams, [name] Carmina Miller, MD as Referring Physician (Radiation Oncology) Earna Coder, MD as Consulting Physician (Internal Medicine) Lemar Livings Merrily Pew, MD as Consulting Physician (General Surgery) Chriss Driver, RN as Registered Nurse Maple Mirza, RN as [facility], [place], [place] as Social Worker  Indicate any recent Medical Services you may have received from other than [facility] providers in the past year (date may be approximate).     Assessment:   This is a routine wellness examination for Ashlee Cox.  Virtual Visit via Telephone Note  I connected with  Ashlee Cox on 08/04/21 at 10:45 AM EDT by telephone and verified that I am speaking with the correct person using two identifiers.  Persons participating in the virtual visit: patient/Nurse Health Advisor   I discussed the limitations of performing an evaluation and management service by telehealth. We continued and completed visit with audio only. Some vital signs may be absent or patient reported.   Hearing/Vision screen Hearing Screening - Comments:: Patient is able to hear conversational tones without difficulty.  No issues reported. Vision Screening - Comments:: Followed by [facility] Wears corrective lenses They have seen their ophthalmologist in the last 12 months.    Dietary issues and exercise activities discussed: Current Exercise Habits: Home exercise routine, Type of exercise: stretching, Intensity: Mild Healthy diet Good water intake   Goals Addressed             This Visit's Progress    Follow up with Primary Care Provider       As needed       Depression Screen    08/04/2021   10:54 AM 05/18/2021    9:21 AM 05/04/2021    9:54 AM 04/20/2021    9:15 AM 10/13/2020  8:17 AM 09/26/2018    3:33 PM 12/13/2017   10:24 AM  PHQ 2/9 Scores  PHQ - 2 Score 0 0 0 0 0 0 0   PHQ- 9 Score      0 1  Exception Documentation      Patient refusal     Fall Risk    05/04/2021    9:53 AM 04/20/2021    9:14 AM 10/13/2020    8:17 AM 09/10/2019   10:42 AM 05/05/2019    9:20 AM  Fall Risk   Falls in the past year? 0 0 0 0 0  Number falls in past yr: 0      Injury with Fall? 0      Risk for fall due to : Medication side effect;Impaired vision No Fall Risks     Follow up Education provided;Falls evaluation completed;Falls prevention discussed Falls evaluation completed Falls evaluation completed Falls evaluation completed Falls evaluation completed    FALL RISK PREVENTION PERTAINING TO THE HOME: Home free of loose throw rugs in walkways, pet beds, electrical cords, etc? Yes  Adequate lighting in your home to reduce risk of falls? Yes   ASSISTIVE DEVICES UTILIZED TO PREVENT FALLS: Life alert? No  Use of a cane, walker or w/c? No   TIMED UP AND GO: Was the test performed? No .   Cognitive Function:  Patient is alert and oriented x3.     Immunizations Immunization History  Administered Date(s) Administered   Influenza Split 04/02/2012   Influenza, High Dose Seasonal PF 12/13/2017   Influenza,inj,Quad PF,6+ Mos 12/03/2014, 12/08/2015, 02/02/2017   PFIZER(Purple Top)SARS-COV-2 Vaccination 05/23/2019, 06/17/2019   Pneumococcal Conjugate-13 12/13/2017   Pneumococcal Polysaccharide-23 01/29/2013, 04/24/2019   Tdap 07/26/2012   Shingrix Completed?: No.    Education has been provided regarding the importance of this vaccine. Patient has been advised to call insurance company to determine out of pocket expense if they have not yet received this vaccine. Advised may also receive vaccine at local pharmacy or Health Dept. Verbalized acceptance and understanding.  Screening Tests Health Maintenance  Topic Date Due   COVID-19 Vaccine (3 - Pfizer risk series) 08/20/2021 (Originally 07/15/2019)   Zoster Vaccines- Shingrix (1 of 2) 12/25/2021 (Originally 10/31/1969)   FOOT  EXAM  10/13/2021   HEMOGLOBIN A1C  10/18/2021   INFLUENZA VACCINE  10/25/2021   MAMMOGRAM  11/17/2021   OPHTHALMOLOGY EXAM  12/02/2021   TETANUS/TDAP  07/27/2022   Fecal DNA (Cologuard)  01/26/2024   Pneumonia Vaccine 24+ Years old  Completed   DEXA SCAN  Completed   Hepatitis C Screening  Completed   HPV VACCINES  Aged Out   COLONOSCOPY (Pts 45-85yrs Insurance coverage will need to be confirmed)  Discontinued   Health Maintenance There are no preventive care reminders to display for this patient.  Mammogram- Followed by Dr.Brahmanday. Due 10/2021.   Lung Cancer Screening: (Low Dose CT Chest recommended if Age 46-80 years, 30 pack-year currently smoking OR have quit w/in 15years.) does not qualify.   Vision Screening: Recommended annual ophthalmology exams for early detection of glaucoma and other disorders of the eye.  Dental Screening: Recommended annual dental exams for proper oral hygiene  Community Resource Referral / Chronic Care Management: CRR required this visit?  No   CCM required this visit?  No      Plan:   Keep all routine maintenance appointments.   I have personally reviewed and noted the following in the patient's chart:   Medical and social history  Use of alcohol, tobacco or illicit drugs  Current medications and supplements including opioid prescriptions. Patient is not currently taking opioid prescriptions. Functional ability and status Nutritional status Physical activity Advanced directives List of other physicians Hospitalizations, surgeries, and ER visits in previous 12 months Vitals Screenings to include cognitive, depression, and falls Referrals and appointments  In addition, I have reviewed and discussed with patient certain preventive protocols, quality metrics, and best practice recommendations. A written personalized care plan for preventive services as well as general preventive health recommendations were provided to patient.      OBrien-Blaney, Jaccob Czaplicki L, LPN   1/61/0960      I have reviewed the above information and agree with above.   Duncan Dull, MD

## 2021-08-08 ENCOUNTER — Encounter: Payer: Self-pay | Admitting: Internal Medicine

## 2021-08-08 ENCOUNTER — Inpatient Hospital Stay (HOSPITAL_BASED_OUTPATIENT_CLINIC_OR_DEPARTMENT_OTHER): Payer: PPO | Admitting: Internal Medicine

## 2021-08-08 ENCOUNTER — Inpatient Hospital Stay: Payer: PPO

## 2021-08-08 ENCOUNTER — Telehealth: Payer: Self-pay | Admitting: *Deleted

## 2021-08-08 ENCOUNTER — Telehealth: Payer: PPO

## 2021-08-08 VITALS — BP 122/77 | HR 78 | Resp 18

## 2021-08-08 DIAGNOSIS — Z171 Estrogen receptor negative status [ER-]: Secondary | ICD-10-CM

## 2021-08-08 DIAGNOSIS — Z5112 Encounter for antineoplastic immunotherapy: Secondary | ICD-10-CM | POA: Diagnosis not present

## 2021-08-08 DIAGNOSIS — C50211 Malignant neoplasm of upper-inner quadrant of right female breast: Secondary | ICD-10-CM

## 2021-08-08 DIAGNOSIS — D4861 Neoplasm of uncertain behavior of right breast: Secondary | ICD-10-CM

## 2021-08-08 LAB — CBC WITH DIFFERENTIAL/PLATELET
Abs Immature Granulocytes: 0.04 10*3/uL (ref 0.00–0.07)
Basophils Absolute: 0.1 10*3/uL (ref 0.0–0.1)
Basophils Relative: 1 %
Eosinophils Absolute: 0.1 10*3/uL (ref 0.0–0.5)
Eosinophils Relative: 2 %
HCT: 30.7 % — ABNORMAL LOW (ref 36.0–46.0)
Hemoglobin: 10 g/dL — ABNORMAL LOW (ref 12.0–15.0)
Immature Granulocytes: 1 %
Lymphocytes Relative: 17 %
Lymphs Abs: 1 10*3/uL (ref 0.7–4.0)
MCH: 27.9 pg (ref 26.0–34.0)
MCHC: 32.6 g/dL (ref 30.0–36.0)
MCV: 85.8 fL (ref 80.0–100.0)
Monocytes Absolute: 0.4 10*3/uL (ref 0.1–1.0)
Monocytes Relative: 7 %
Neutro Abs: 4.3 10*3/uL (ref 1.7–7.7)
Neutrophils Relative %: 72 %
Platelets: 161 10*3/uL (ref 150–400)
RBC: 3.58 MIL/uL — ABNORMAL LOW (ref 3.87–5.11)
RDW: 19.9 % — ABNORMAL HIGH (ref 11.5–15.5)
WBC: 6 10*3/uL (ref 4.0–10.5)
nRBC: 0 % (ref 0.0–0.2)

## 2021-08-08 LAB — COMPREHENSIVE METABOLIC PANEL
ALT: 21 U/L (ref 0–44)
AST: 40 U/L (ref 15–41)
Albumin: 3.8 g/dL (ref 3.5–5.0)
Alkaline Phosphatase: 82 U/L (ref 38–126)
Anion gap: 8 (ref 5–15)
BUN: 14 mg/dL (ref 8–23)
CO2: 24 mmol/L (ref 22–32)
Calcium: 9.5 mg/dL (ref 8.9–10.3)
Chloride: 103 mmol/L (ref 98–111)
Creatinine, Ser: 1.01 mg/dL — ABNORMAL HIGH (ref 0.44–1.00)
GFR, Estimated: >60 mL/min (ref >60)
Glucose, Bld: 146 mg/dL — ABNORMAL HIGH (ref 70–99)
Potassium: 3.9 mmol/L (ref 3.5–5.1)
Sodium: 135 mmol/L (ref 135–145)
Total Bilirubin: 0.6 mg/dL (ref 0.3–1.2)
Total Protein: 7.3 g/dL (ref 6.5–8.1)

## 2021-08-08 LAB — TSH: TSH: 54 u[IU]/mL — ABNORMAL HIGH (ref 0.350–4.500)

## 2021-08-08 MED ORDER — SODIUM CHLORIDE 0.9 % IV SOLN
60.0000 mg/m2 | Freq: Once | INTRAVENOUS | Status: AC
  Administered 2021-08-08: 120 mg via INTRAVENOUS
  Filled 2021-08-08: qty 20

## 2021-08-08 MED ORDER — HEPARIN SOD (PORK) LOCK FLUSH 100 UNIT/ML IV SOLN
500.0000 [IU] | Freq: Once | INTRAVENOUS | Status: DC | PRN
  Filled 2021-08-08: qty 5

## 2021-08-08 MED ORDER — DIPHENHYDRAMINE HCL 50 MG/ML IJ SOLN
50.0000 mg | Freq: Once | INTRAMUSCULAR | Status: AC
  Administered 2021-08-08: 50 mg via INTRAVENOUS
  Filled 2021-08-08: qty 1

## 2021-08-08 MED ORDER — FAMOTIDINE IN NACL 20-0.9 MG/50ML-% IV SOLN
20.0000 mg | Freq: Once | INTRAVENOUS | Status: AC
  Administered 2021-08-08: 20 mg via INTRAVENOUS
  Filled 2021-08-08: qty 50

## 2021-08-08 MED ORDER — SODIUM CHLORIDE 0.9 % IV SOLN
200.0000 mg | Freq: Once | INTRAVENOUS | Status: AC
  Administered 2021-08-08: 200 mg via INTRAVENOUS
  Filled 2021-08-08: qty 8

## 2021-08-08 MED ORDER — SODIUM CHLORIDE 0.9 % IV SOLN
10.0000 mg | Freq: Once | INTRAVENOUS | Status: AC
  Administered 2021-08-08: 10 mg via INTRAVENOUS
  Filled 2021-08-08: qty 10

## 2021-08-08 MED ORDER — HEPARIN SOD (PORK) LOCK FLUSH 100 UNIT/ML IV SOLN
500.0000 [IU] | Freq: Once | INTRAVENOUS | Status: AC
  Administered 2021-08-08: 500 [IU] via INTRAVENOUS
  Filled 2021-08-08: qty 5

## 2021-08-08 MED ORDER — SODIUM CHLORIDE 0.9% FLUSH
10.0000 mL | Freq: Once | INTRAVENOUS | Status: AC
  Administered 2021-08-08: 10 mL via INTRAVENOUS
  Filled 2021-08-08: qty 10

## 2021-08-08 MED ORDER — SODIUM CHLORIDE 0.9 % IV SOLN
Freq: Once | INTRAVENOUS | Status: AC
  Filled 2021-08-08: qty 250

## 2021-08-08 NOTE — Progress Notes (Signed)
[facility] ?CONSULT NOTE ? ?Patient Care Team: ?[name], [name] ?Noreene Filbert, MD as Referring Physician (Radiation Oncology) ?Cammie Sickle, MD as Consulting Physician (Internal Medicine) ?Robert Bellow, MD as Consulting Physician (General Surgery) ?Jeral Fruit, RN as Registered Nurse ?[name], RN as [place] Management ?[place], [place], [place] as Education officer, museum ? ?CHIEF COMPLAINTS/PURPOSE OF CONSULTATION: Breast cancer ? 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 Byrnett.] [id number] Carbo-taxol-AC;  JAN 2022-s/p Lumpectomy & ALND- [ypT1a (18m ) ypN1 (2/11 LN-positive)]; s/p radiation end of April 2022. ? ?#Early June 2022-Xeloda 1000 mg per metered square [2000] 2 weeks on 1 week of 6-8 cycles; July 11th 2022- STARTING cycle #3-cut down the dose to 1500 mg BID[sec to HFS].  ? ?# JAN 2023- A. SKIN, RIGHT BREAST; PUNCH BIOPSY: [Dr.Byrnett] ?- INVASIVE CARCINOMA WITH DERMAL INFILTRATION AND LYMPHATIC INVASION. ER- weak POSISTIVE [10%]; PR/her2 NEG [[facility] 0]; JAN 20th, 2023- IMPRESSION: ?1. Although the hypermetabolic right axillary node has resolved ?since the prior PET, there is a new hypermetabolic right low ?cervical/supraclavicular node, suspicious for nodal metastasis. ?2. Decrease in right breast hypermetabolism with nonspecific ?overlying skin thickening in the setting of prior lumpectomy. ?3. Low-level hypermetabolism within the T2 vertebral body, ?suspicious for isolated osseous metastasis. This could be confirmed ?with pre and postcontrast thoracic spine MRI. ?4. Hypermetabolic thoracic nodes, slightly progressive. Given ?progression, nodal metastasis are slightly favored over reactive ?etiologies. ?5. Incidental findings, including: Aortic Atherosclerosis ?(ICD10-I70.0). Tiny hiatal hernia. Hepatic steatosis with ?nonspecific caudate lobe  enlargemen ? ?Comment:  ?The carcinoma is morphologically similar to the post-neoadjuvant mammary  ?carcinoma seen in the January 2022 right breast wide excision and lymph  ?Nodes ? ?# FEB 6th-Taxol single agent #1; FEB 13th, 2023- [CPS =15]; Taxol-Keytruda ? ? 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 ? ? ?  ?Carcinoma of upper-inner quadrant of right breast in female, estrogen receptor negative ([place]  ?10/07/2019 Initial Diagnosis  ? Carcinoma of upper-inner quadrant of right breast in female, estrogen receptor negative ([facility] ?  ?12/04/2019 Genetic Testing  ? Negative genetic testing. No pathogenic variants identified on the [facility] Common Hereditary Cancers Panel + Skin Cancers Panel. The report date is 12/04/2019. ? ?The Common Hereditary Cancers Panel + Skin Cancers Panel offered by [facility] includes sequencing and/or deletion duplication testing of the following 54 genes: APC*, ATM*, AXIN2, BAP1, BARD1, BMPR1A, BRCA1, BRCA2, BRIP1, CDH1, CDK4, CDKN2A (p14ARF), CDKN2A (p16INK4a), CHEK2, CTNNA1, DICER1*, EPCAM*, GREM1*, HOXB13, KIT, ?MEN1*, MITF*, MLH1*, MSH2*, MSH3*, MSH6*, MUTYH, NBN, NF1*, NTHL1, PALB2, PDGFRA, PMS2*, POLD1*, POLE, POT1, PTCH1, PTEN*, RAD50, RAD51C, RAD51D, RB1*, RNF43, SDHA*, SDHB, SDHC*, SDHD, SMAD4, SMARCA4, STK11, SUFU, TP53, TSC1*, TSC2, VHL.  ?  ?05/07/2020 Cancer Staging  ? Staging form: Breast, AJCC 8th Edition ?- Clinical: Stage IIIB (cT2, cN1, cM0, G3, ER-, PR-, HER2-) - Signed by BCammie Sickle MD on 05/07/2020 ? ?  ?04/18/2021 Cancer Staging  ? Staging form: Breast, AJCC 8th Edition ?- Pathologic: Stage IV (pT4d, pN3c, cM1, ER+, PR-, HER2-) - Signed by BCammie Sickle MD on 04/18/2021 ?Nuclear grade: G3 ? ?  ? ? ? ?HISTORY OF PRESENTING ILLNESS: Accompanied by husband.  Walking independently. ? ?Ashlee [id number].  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 ? ?Patient complains of mild tingling and  numbness in extremities.  However patient is still able to crochet.  No falls. ? ?Patient admits to resolution of the breast skin rash.  Denies any nausea vomiting abdominal pain. Complains of fatigue.  ? ?Review of Systems  ?Constitutional:  Positive for malaise/fatigue. Negative for chills, diaphoresis, fever and weight loss.  ?HENT:  Negative for nosebleeds and sore throat.   ?Eyes:  Negative for double vision.  ?Respiratory:  Negative for cough, hemoptysis, sputum production and wheezing.   ?Cardiovascular:  Negative for chest pain, palpitations, orthopnea and leg swelling.  ?Gastrointestinal:  Negative for abdominal pain, blood in stool, constipation and melena.  ?Genitourinary:  Negative for dysuria, frequency and urgency.  ?Musculoskeletal:  Positive for back pain and neck pain. Negative for joint pain.  ?Neurological:  Positive for tingling. Negative for dizziness, focal weakness, weakness and headaches.  ?Endo/Heme/Allergies:  Does not bruise/bleed easily.  ?Psychiatric/Behavioral:  Negative for depression. The patient is not nervous/anxious and does not have insomnia.    ? ?MEDICAL HISTORY:  ?Past Medical History:  ?Diagnosis Date  ? [facility]) 2021  ? right breast  ? Cyst, breast   ? benign  ? Diabetes mellitus without complication ([place])   ? Family history of melanoma   ? Family history of ovarian cancer   ? GERD (gastroesophageal reflux disease)   ? H/O: rheumatic fever   ? History of colonoscopy 2010  ? 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 Hyperlipidemia   ? Hypertension   ? Menopause   ? at age 61  ? Personal history of chemotherapy 09/2019  ? RIGHT  INVASIVE MAMMARY CARCINOMA  ? Personal history of radiation therapy   ? ? ?SURGICAL HISTORY: ?Past Surgical History:  ?Procedure Laterality Date  ? APPENDECTOMY  2006  ? BREAST BIOPSY Right 09/22/2019  ? INVASIVE MAMMARY CARCINOMA  ? BREAST CYST ASPIRATION Left  1999  ? BREAST LUMPECTOMY Left 04/23/2020  ? surgery with NL and SN   ? BREAST LUMPECTOMY WITH NEEDLE LOCALIZATION Right 04/23/2020  ? Procedure: BREAST LUMPECTOMY WITH NEEDLE LOCALIZATION;  Surgeon: Robert Bellow, MD;  Location: [facility] ORS;  Service: General;  Laterality: Right;  ? BREAST LUMPECTOMY WITH SENTINEL LYMPH NODE BIOPSY Right 04/23/2020  ? Procedure: BREAST LUMPECTOMY WITH SENTINEL LYMPH NODE BX;  Surgeon: Robert Bellow, MD;  Location: [facility] ORS;  Service: General;  Laterality: Right;  ? CHOLECYSTECTOMY  2006  ? COLONOSCOPY    ? PORTACATH PLACEMENT Left 10/06/2019  ? Procedure: INSERTION PORT-A-CATH;  Surgeon: Robert Bellow, MD;  Location: [facility] ORS;  Service: General;  Laterality: Left;  ? SUBMUCOSAL TATTOO INJECTION Right 10/06/2019  ? Procedure: Right axillary TATTOO INJECTION;  Surgeon: Robert Bellow, MD;  Location: [facility] ORS;  Service: General;  Laterality: Right;  ? VAGINAL DELIVERY    ? x3  ? ? ?SOCIAL HISTORY: ?Social History  ? ?Socioeconomic History  ? Marital status: Married  ?  Spouse name: Chrissie Noa  ? Number of children: 3  ? Years of education: Not on file  ? Highest education level: Not on file  ?Occupational History  ? Occupation: stocker  ?  Employer: [employer]  ?Tobacco Use  ? Smoking status: Former  ?  Years: 1.00  ?  Types: Cigarettes  ?  Quit date: 12/26/2005  ?  Years since quitting: 15.6  ? Smokeless tobacco: Never  ? Tobacco comments:  ?  smoked for less than 1 years,  1 cig/day  ?Vaping Use  ?  Vaping Use: Never used  ?Substance and Sexual Activity  ? Alcohol use: No  ? Drug use: No  ? Sexual activity: Never  ?  Partners: Female  ?Other Topics Concern  ? Not on file  ?Social History Narrative  ? Widowed, March 2014; remarried.  ?   ? [employer] retd; quit smoking 1ppw; no alcohol.   ?   ? ?Social Determinants of Health  ? ?Financial Resource Strain: Medium Risk  ? Difficulty of Paying Living Expenses: Somewhat hard  ?Food Insecurity: No Food Insecurity  ? Worried About Paediatric nurse in the Last Year: Never true  ? Ran Out of Food in the Last Year: Never true  ?Transportation Needs: No Transportation Needs  ? Lack of Transportation (Medical): No  ? Lack of Transportation (N

## 2021-08-08 NOTE — Progress Notes (Signed)
Patient has left knee pain (6/10 pain) for a few months that is not improving and would like a referral to orthopaedic.   ?

## 2021-08-08 NOTE — Assessment & Plan Note (Addendum)
#  Recurrent right breast cancer-s/p breast skin biopsy-week ER positive /PR HER2 negative- recurrent- stage IV; NGS -FEB 2023- CPS =15%. Currently on Keytruda- taxol [2/13]; CT CHEST:  Since the PET of 04/15/2021, enlargement of the previously ?described hypermetabolic right low cervical/supraclavicular nodal metastasis. Foci of hyperenhancement within the right breast are new or more well-defined since 02/02/2021, suspicious for progressive residual/recurrent disease. Nonspecific mediastinal nodes, similar to slightly decreased in size;  New sclerosis at the site of a known T2 vertebral body metastasis.  ? ?#I reviewed the rather less than optimal/weeks response noted on the scan the patient and her husband.  Given the limited line of options-I would recommend considering evaluation with Dr. Donella Stade for local radiation to the right neck lymphadenopathy.  In general patients breast rash is significantly improved.  I have also reached out to Dr. Bary Castilla.  Discussed with Dr. Donella Stade awaiting evaluation this week. ?? ?# Proceed with Chemo- cycle #4; day-1 [at redcued dose of Taxol to 60 mg per metered square; Labs today reviewed;  acceptable for treatment today.   ? ?# Left Knee- Bil-likely secondary to osteoarthritis less likely from [facility].  Will [facility].  ? ?# T2 thoracic metastatic disease [confirmed MRI Jan 2023]-discussed regarding radiation patient wants to hold off for now. S/p  Zometa treatment- STABLE.  ? ?# Hyponatremia: Sodium 129- recommend inreasing fluid intake.  ? ?# PN-1-2 sec to taxol-on Neurontin 200 qhs- ? ?# DM/poorly controlled- continue metformin 2000 mg q AM [Dr.Tullo]; on glipizide 10 mg XL. Continue follow up with  PCP. [PBF-144; ; reviewed the log BGContinue dietary restrictions. On CGM.   ? ?# Hypoklaemia: K- 3.7 Kdur 20 meq/day-refilled.  ? ?# Mediport: Functioning.  ? ?#Incidental findings on Imaging CT May , 2023:  Hepatic morphology which favors mild cirrhosis; Mild  proximal gastric wall thickening, suspicious for gastritis;  Aortic Atherosclerosis; I reviewed/discussed/counseled the patient.  ? ?Key-3week; Taxol-weekx3;1 w-off ? ?# DISPOSITION: ?# refer [facility] re: knee pain ?# chemo today; ?# 1 week- labs- cbc/cmp;taxol;  ?# follow up 2 weeks-- MD; labs- cbc/cmp; taxol- ?# Follow up in 3 weeks- labs- cbc/cmp;Ketruda- Dr.B ? ?# I reviewed the blood work- with the patient in detail; also reviewed the imaging independently [as summarized above]; and with the patient in detail.  ? ? ?

## 2021-08-08 NOTE — Progress Notes (Signed)
I called the patient regarding elevated. TSH. Would recommend Synthroid one hundred micrograms recommend taking in the morning. Will continue follow.

## 2021-08-08 NOTE — Telephone Encounter (Signed)
?  Care Management  ? ?Follow Up Note ? ? ?08/08/2021 ?Name: Ashlee Cox MRN: [id number] DOB: 10-10-1950 ? ? ?Referred by: [name], MD ?Reason for referral : Chronic Care Management (DM) ? ? ?An unsuccessful telephone outreach was attempted today. The patient was referred to the case management team for assistance with care management and care coordination.   PER chart review looks as if patient is in chemotherapy. Will reschedule appointment. ? ?Follow Up Plan: The care management team will reach out to the patient again over the next 30 days.  ? ?Hubert Azure RN, MSN ?RN Care Management Coordinator ?[place] ?[phone] ?[email or web address] ? ?

## 2021-08-08 NOTE — Patient Instructions (Signed)
[facility]  Discharge Instructions: °Thank you for choosing [facility] to provide your oncology and hematology care.  ° °If you have a lab appointment with the Cancer Center, please go directly to the Cancer Center and check in at the registration area. °  °Wear comfortable clothing and clothing appropriate for easy access to any Portacath or PICC line.  ° °We strive to give you quality time with your provider. You may need to reschedule your appointment if you arrive late (15 or more minutes).  Arriving late affects you and other patients whose appointments are after yours.  Also, if you miss three or more appointments without notifying the office, you may be dismissed from the clinic at the provider’s discretion.    °  °For prescription refill requests, have your pharmacy contact our office and allow 72 hours for refills to be completed.   ° °Today you received the following chemotherapy and/or immunotherapy agents     °  °To help prevent nausea and vomiting after your treatment, we encourage you to take your nausea medication as directed. ° °BELOW ARE SYMPTOMS THAT SHOULD BE REPORTED IMMEDIATELY: °*FEVER GREATER THAN 100.4 F (38 °C) OR HIGHER °*CHILLS OR SWEATING °*NAUSEA AND VOMITING THAT IS NOT CONTROLLED WITH YOUR NAUSEA MEDICATION °*UNUSUAL SHORTNESS OF BREATH °*UNUSUAL BRUISING OR BLEEDING °*URINARY PROBLEMS (pain or burning when urinating, or frequent urination) °*BOWEL PROBLEMS (unusual diarrhea, constipation, pain near the anus) °TENDERNESS IN MOUTH AND THROAT WITH OR WITHOUT PRESENCE OF ULCERS (sore throat, sores in mouth, or a toothache) °UNUSUAL RASH, SWELLING OR PAIN  °UNUSUAL VAGINAL DISCHARGE OR ITCHING  ° °Items with * indicate a potential emergency and should be followed up as soon as possible or go to the Emergency Department if any problems should occur. ° °Please show the CHEMOTHERAPY ALERT CARD or IMMUNOTHERAPY ALERT CARD at check-in to the  Emergency Department and triage nurse. ° °Should you have questions after your visit or need to cancel or reschedule your appointment, please contact [facility] [place]  Dept: [phone]  and follow the prompts.  Office hours are 8:00 a.m. to 4:30 p.m. Monday - Friday. Please note that voicemails left after 4:00 p.m. may not be returned until the following business day.  We are closed weekends and major holidays. You have access to a nurse at all times for urgent questions. Please call the main number to the clinic Dept: [phone] and follow the prompts. ° ° °For any non-urgent questions, you may also contact your provider using MyChart. We now offer e-Visits for anyone 18 and older to request care online for non-urgent symptoms. For details visit [email or web address]. °  °Also download the MyChart app! Go to the app store, search "MyChart", open the app, select [place], and log in with your MyChart username and password. ° °Due to Covid, a mask is required upon entering the hospital/clinic. If you do not have a mask, one will be given to you upon arrival. For doctor visits, patients may have 1 support person aged 18 or older with them. For treatment visits, patients cannot have anyone with them due to current Covid guidelines and our immunocompromised population.  ° °

## 2021-08-09 ENCOUNTER — Other Ambulatory Visit: Payer: Self-pay

## 2021-08-09 ENCOUNTER — Other Ambulatory Visit: Payer: Self-pay | Admitting: Internal Medicine

## 2021-08-09 DIAGNOSIS — Z171 Estrogen receptor negative status [ER-]: Secondary | ICD-10-CM

## 2021-08-09 MED ORDER — LEVOTHYROXINE SODIUM 100 MCG PO TABS: 100.0000 ug | ORAL_TABLET | Freq: Every day | ORAL | 3 refills | Status: DC

## 2021-08-09 NOTE — Progress Notes (Signed)
I spoke to patient regarding elevated TSH.  Recommend Synthroid-recommend taking on empty stomach early in the morning.  Prescription sent ? ?H/A-please order thyroid panel at next visit. GB ?

## 2021-08-09 NOTE — Progress Notes (Signed)
Lab added to next visit. ?

## 2021-08-10 DIAGNOSIS — M1712 Unilateral primary osteoarthritis, left knee: Secondary | ICD-10-CM | POA: Diagnosis not present

## 2021-08-11 ENCOUNTER — Encounter: Payer: Self-pay | Admitting: Radiation Oncology

## 2021-08-11 ENCOUNTER — Ambulatory Visit
Admission: RE | Admit: 2021-08-11 | Discharge: 2021-08-11 | Disposition: A | Discharge status: Home / Self Care | Payer: PPO | Source: Ambulatory Visit | Attending: Radiation Oncology | Admitting: Radiation Oncology

## 2021-08-11 VITALS — BP 139/65 | HR 85 | Temp 98.7°F | Resp 20 | Wt 192.9 lb

## 2021-08-11 DIAGNOSIS — C50911 Malignant neoplasm of unspecified site of right female breast: Secondary | ICD-10-CM

## 2021-08-11 DIAGNOSIS — Z171 Estrogen receptor negative status [ER-]: Secondary | ICD-10-CM | POA: Diagnosis not present

## 2021-08-11 DIAGNOSIS — C7951 Secondary malignant neoplasm of bone: Secondary | ICD-10-CM | POA: Diagnosis not present

## 2021-08-11 DIAGNOSIS — C50211 Malignant neoplasm of upper-inner quadrant of right female breast: Secondary | ICD-10-CM | POA: Diagnosis not present

## 2021-08-11 NOTE — Progress Notes (Signed)
Radiation Oncology Follow up Note old patient new area right supraclavicular and T2 metastasis  Name: Ashlee Cox   Date:   08/11/2021 MRN:  [id number] DOB: 1950/10/23    This 71 y.o. female presents to the clinic today for evaluation of progressive metastatic disease in her right supraclavicular and T-spine and patient with known stage IV breast cancer triple negative.  REFERRING PROVIDER: [name], MD  HPI: Patient is a 71 year old female well-known to her apartment having been treated a year prior to her right breast and peripheral lymphatics for additional stage IIb (T2 N1 M0) triple negative invasive mammary carcinoma..  She went on to develop recurrent invasive carcinoma with durable infiltration lymphatic invasion ER weakly positive in January 2023.  She had a PET CT scan in January showing decrease in right breast hypermetabolic activity.  There was hypermetabolic activity in right low cervical supraclavicular nodes suspicious for nodal metastasis as well as low-level hypermetabolic activity in T2 vertebral body suspicious for osseous metastasis.  Patient has been on Taxol Keytruda although recent CT scan showed enlargement of previous described hypermetabolic right low cervical supraclavicular nodal metastasis.  There is also a focus of hyperenhancement within the right breast new or more defined since the November 22.  She also has nonspecific mediastinal nodes similar to slightly decreased in size.  Patient is having some back pain she does take Tylenol for that on occasion.  Does have a prominent right supraclavicular mass I been asked to evaluate for possible palliative radiation therapy to this region.  COMPLICATIONS OF TREATMENT: none  FOLLOW UP COMPLIANCE: keeps appointments   PHYSICAL EXAM:  BP 139/65   Pulse 85   Temp 98.7 F (37.1 C)   Resp 20   Wt 192 lb 14.4 oz (87.5 kg)   SpO2 100%   BMI 30.21 kg/m  Patient has large nodal metastasis in the right  supraclavicular fossa.  Deep palpation of her spine does not elicit pain motor or sensory in detail levels are equal symmetric in upper lower extremities.  No discernible focal mass is noted in the right breast.  Well-developed well-nourished patient in NAD. HEENT reveals PERLA, EOMI, discs not visualized.  Oral cavity is clear. No oral mucosal lesions are identified. Neck is clear without evidence of cervical or supraclavicular adenopathy. Lungs are clear to A&P. Cardiac examination is essentially unremarkable with regular rate and rhythm without murmur rub or thrill. Abdomen is benign with no organomegaly or masses noted. Motor sensory and DTR levels are equal and symmetric in the upper and lower extremities. Cranial nerves II through XII are grossly intact. Proprioception is intact. No peripheral adenopathy or edema is identified. No motor or sensory levels are noted. Crude visual fields are within normal range.  RADIOLOGY RESULTS: CT scans and PET CT scans reviewed compatible with above-stated findings  PLAN: Present time elect go with palliative radiation therapy to her right supraclavicular nodal metastasis as well as T2 vertebral body.  We will plan on delivering 30 Gray in 10 fractions.  Risks and benefits of treatment including possible radiation esophagitis skin reaction fatigue alteration of blood counts all were reviewed with the patient.  We will try to limit exposure to the brachial plexus although this is an area that was previously treated.  Patient comprehends my recommendations well simulation appointment was given.  I would like to take this opportunity to thank you for allowing me to participate in the care of your patient.Noreene Filbert, MD

## 2021-08-12 MED FILL — Dexamethasone Sodium Phosphate Inj 100 MG/10ML: INTRAMUSCULAR | Qty: 1 | Status: AC

## 2021-08-15 ENCOUNTER — Inpatient Hospital Stay: Payer: PPO

## 2021-08-15 ENCOUNTER — Ambulatory Visit: Payer: PPO | Admitting: Internal Medicine

## 2021-08-15 VITALS — BP 135/85 | HR 90 | Temp 97.1°F | Resp 18 | Wt 188.7 lb

## 2021-08-15 DIAGNOSIS — D4861 Neoplasm of uncertain behavior of right breast: Secondary | ICD-10-CM

## 2021-08-15 DIAGNOSIS — Z5112 Encounter for antineoplastic immunotherapy: Secondary | ICD-10-CM | POA: Diagnosis not present

## 2021-08-15 DIAGNOSIS — Z171 Estrogen receptor negative status [ER-]: Secondary | ICD-10-CM

## 2021-08-15 LAB — CBC WITH DIFFERENTIAL/PLATELET
Abs Immature Granulocytes: 0.47 10*3/uL — ABNORMAL HIGH (ref 0.00–0.07)
Basophils Absolute: 0.1 10*3/uL (ref 0.0–0.1)
Basophils Relative: 2 %
Eosinophils Absolute: 0.2 10*3/uL (ref 0.0–0.5)
Eosinophils Relative: 3 %
HCT: 33.2 % — ABNORMAL LOW (ref 36.0–46.0)
Hemoglobin: 10.9 g/dL — ABNORMAL LOW (ref 12.0–15.0)
Immature Granulocytes: 5 %
Lymphocytes Relative: 25 %
Lymphs Abs: 2.1 10*3/uL (ref 0.7–4.0)
MCH: 28.3 pg (ref 26.0–34.0)
MCHC: 32.8 g/dL (ref 30.0–36.0)
MCV: 86.2 fL (ref 80.0–100.0)
Monocytes Absolute: 0.5 10*3/uL (ref 0.1–1.0)
Monocytes Relative: 6 %
Neutro Abs: 5.2 10*3/uL (ref 1.7–7.7)
Neutrophils Relative %: 59 %
Platelets: 232 10*3/uL (ref 150–400)
RBC: 3.85 MIL/uL — ABNORMAL LOW (ref 3.87–5.11)
RDW: 20.4 % — ABNORMAL HIGH (ref 11.5–15.5)
WBC: 8.7 10*3/uL (ref 4.0–10.5)
nRBC: 0 % (ref 0.0–0.2)

## 2021-08-15 LAB — COMPREHENSIVE METABOLIC PANEL
ALT: 37 U/L (ref 0–44)
AST: 50 U/L — ABNORMAL HIGH (ref 15–41)
Albumin: 4.3 g/dL (ref 3.5–5.0)
Alkaline Phosphatase: 93 U/L (ref 38–126)
Anion gap: 9 (ref 5–15)
BUN: 19 mg/dL (ref 8–23)
CO2: 24 mmol/L (ref 22–32)
Calcium: 10.1 mg/dL (ref 8.9–10.3)
Chloride: 104 mmol/L (ref 98–111)
Creatinine, Ser: 0.98 mg/dL (ref 0.44–1.00)
GFR, Estimated: >60 mL/min (ref >60)
Glucose, Bld: 109 mg/dL — ABNORMAL HIGH (ref 70–99)
Potassium: 4 mmol/L (ref 3.5–5.1)
Sodium: 137 mmol/L (ref 135–145)
Total Bilirubin: 0.7 mg/dL (ref 0.3–1.2)
Total Protein: 8.3 g/dL — ABNORMAL HIGH (ref 6.5–8.1)

## 2021-08-15 MED ORDER — SODIUM CHLORIDE 0.9 % IV SOLN
60.0000 mg/m2 | Freq: Once | INTRAVENOUS | Status: AC
  Administered 2021-08-15: 120 mg via INTRAVENOUS
  Filled 2021-08-15: qty 20

## 2021-08-15 MED ORDER — SODIUM CHLORIDE 0.9 % IV SOLN
10.0000 mg | Freq: Once | INTRAVENOUS | Status: AC
  Administered 2021-08-15: 10 mg via INTRAVENOUS
  Filled 2021-08-15: qty 10

## 2021-08-15 MED ORDER — DIPHENHYDRAMINE HCL 50 MG/ML IJ SOLN
50.0000 mg | Freq: Once | INTRAMUSCULAR | Status: AC
  Administered 2021-08-15: 50 mg via INTRAVENOUS
  Filled 2021-08-15: qty 1

## 2021-08-15 MED ORDER — SODIUM CHLORIDE 0.9 % IV SOLN
Freq: Once | INTRAVENOUS | Status: AC
  Filled 2021-08-15: qty 250

## 2021-08-15 MED ORDER — FAMOTIDINE IN NACL 20-0.9 MG/50ML-% IV SOLN
20.0000 mg | Freq: Once | INTRAVENOUS | Status: AC
  Administered 2021-08-15: 20 mg via INTRAVENOUS
  Filled 2021-08-15: qty 50

## 2021-08-15 MED ORDER — HEPARIN SOD (PORK) LOCK FLUSH 100 UNIT/ML IV SOLN
500.0000 [IU] | Freq: Once | INTRAVENOUS | Status: AC | PRN
  Administered 2021-08-15: 500 [IU]
  Filled 2021-08-15: qty 5

## 2021-08-15 NOTE — Patient Instructions (Signed)
[facility] [place]  Discharge Instructions: Thank you for choosing [facility] to provide your oncology and hematology care.  If you have a lab appointment with the Cancer Center, please go directly to the Cancer Center and check in at the registration area.  Wear comfortable clothing and clothing appropriate for easy access to any Portacath or PICC line.   We strive to give you quality time with your provider. You may need to reschedule your appointment if you arrive late (15 or more minutes).  Arriving late affects you and other patients whose appointments are after yours.  Also, if you miss three or more appointments without notifying the office, you may be dismissed from the clinic at the provider's discretion.      For prescription refill requests, have your pharmacy contact our office and allow 72 hours for refills to be completed.    Today you received the following chemotherapy and/or immunotherapy agents Taxol      To help prevent nausea and vomiting after your treatment, we encourage you to take your nausea medication as directed.  BELOW ARE SYMPTOMS THAT SHOULD BE REPORTED IMMEDIATELY: *FEVER GREATER THAN 100.4 F (38 C) OR HIGHER *CHILLS OR SWEATING *NAUSEA AND VOMITING THAT IS NOT CONTROLLED WITH YOUR NAUSEA MEDICATION *UNUSUAL SHORTNESS OF BREATH *UNUSUAL BRUISING OR BLEEDING *URINARY PROBLEMS (pain or burning when urinating, or frequent urination) *BOWEL PROBLEMS (unusual diarrhea, constipation, pain near the anus) TENDERNESS IN MOUTH AND THROAT WITH OR WITHOUT PRESENCE OF ULCERS (sore throat, sores in mouth, or a toothache) UNUSUAL RASH, SWELLING OR PAIN  UNUSUAL VAGINAL DISCHARGE OR ITCHING   Items with * indicate a potential emergency and should be followed up as soon as possible or go to the Emergency Department if any problems should occur.  Please show the CHEMOTHERAPY ALERT CARD or IMMUNOTHERAPY ALERT CARD at check-in to the  Emergency Department and triage nurse.  Should you have questions after your visit or need to cancel or reschedule your appointment, please contact [facility] [place]  [phone] and follow the prompts.  Office hours are 8:00 a.m. to 4:30 p.m. Monday - Friday. Please note that voicemails left after 4:00 p.m. may not be returned until the following business day.  We are closed weekends and major holidays. You have access to a nurse at all times for urgent questions. Please call the main number to the clinic [phone] and follow the prompts.  For any non-urgent questions, you may also contact your provider using MyChart. We now offer e-Visits for anyone 18 and older to request care online for non-urgent symptoms. For details visit [email or web address].   Also download the MyChart app! Go to the app store, search "MyChart", open the app, select [place], and log in with your MyChart username and password.  Due to Covid, a mask is required upon entering the hospital/clinic. If you do not have a mask, one will be given to you upon arrival. For doctor visits, patients may have 1 support person aged 18 or older with them. For treatment visits, patients cannot have anyone with them due to current Covid guidelines and our immunocompromised population.  

## 2021-08-16 ENCOUNTER — Ambulatory Visit
Admission: RE | Admit: 2021-08-16 | Discharge: 2021-08-16 | Disposition: A | Discharge status: Home / Self Care | Payer: PPO | Source: Ambulatory Visit | Attending: Radiation Oncology | Admitting: Radiation Oncology

## 2021-08-16 DIAGNOSIS — C7951 Secondary malignant neoplasm of bone: Secondary | ICD-10-CM | POA: Diagnosis not present

## 2021-08-16 DIAGNOSIS — Z171 Estrogen receptor negative status [ER-]: Secondary | ICD-10-CM | POA: Diagnosis not present

## 2021-08-16 DIAGNOSIS — Z510 Encounter for antineoplastic radiation therapy: Secondary | ICD-10-CM | POA: Insufficient documentation

## 2021-08-16 DIAGNOSIS — Z5112 Encounter for antineoplastic immunotherapy: Secondary | ICD-10-CM | POA: Diagnosis not present

## 2021-08-16 DIAGNOSIS — C50211 Malignant neoplasm of upper-inner quadrant of right female breast: Secondary | ICD-10-CM | POA: Insufficient documentation

## 2021-08-16 LAB — THYROID PANEL WITH TSH
Free Thyroxine Index: 0.3 — ABNORMAL LOW (ref 1.2–4.9)
T3 Uptake Ratio: 8 % — ABNORMAL LOW (ref 24–39)
T4, Total: 3.4 ug/dL — ABNORMAL LOW (ref 4.5–12.0)
TSH: 57.7 u[IU]/mL — ABNORMAL HIGH (ref 0.450–4.500)

## 2021-08-17 DIAGNOSIS — C7951 Secondary malignant neoplasm of bone: Secondary | ICD-10-CM | POA: Diagnosis not present

## 2021-08-17 DIAGNOSIS — Z171 Estrogen receptor negative status [ER-]: Secondary | ICD-10-CM | POA: Diagnosis not present

## 2021-08-17 DIAGNOSIS — Z5112 Encounter for antineoplastic immunotherapy: Secondary | ICD-10-CM | POA: Diagnosis not present

## 2021-08-17 DIAGNOSIS — C50211 Malignant neoplasm of upper-inner quadrant of right female breast: Secondary | ICD-10-CM | POA: Diagnosis not present

## 2021-08-19 ENCOUNTER — Other Ambulatory Visit: Payer: Self-pay | Admitting: *Deleted

## 2021-08-19 DIAGNOSIS — C50911 Malignant neoplasm of unspecified site of right female breast: Secondary | ICD-10-CM

## 2021-08-19 MED FILL — Dexamethasone Sodium Phosphate Inj 100 MG/10ML: INTRAMUSCULAR | Qty: 1 | Status: AC

## 2021-08-23 ENCOUNTER — Inpatient Hospital Stay (HOSPITAL_BASED_OUTPATIENT_CLINIC_OR_DEPARTMENT_OTHER): Payer: PPO | Admitting: Internal Medicine

## 2021-08-23 ENCOUNTER — Ambulatory Visit: Admission: RE | Admit: 2021-08-23 | Payer: PPO | Source: Ambulatory Visit

## 2021-08-23 ENCOUNTER — Encounter: Payer: Self-pay | Admitting: Internal Medicine

## 2021-08-23 ENCOUNTER — Inpatient Hospital Stay: Payer: PPO

## 2021-08-23 DIAGNOSIS — Z171 Estrogen receptor negative status [ER-]: Secondary | ICD-10-CM | POA: Diagnosis not present

## 2021-08-23 DIAGNOSIS — C50211 Malignant neoplasm of upper-inner quadrant of right female breast: Secondary | ICD-10-CM

## 2021-08-23 DIAGNOSIS — D4861 Neoplasm of uncertain behavior of right breast: Secondary | ICD-10-CM

## 2021-08-23 DIAGNOSIS — C7951 Secondary malignant neoplasm of bone: Secondary | ICD-10-CM | POA: Diagnosis not present

## 2021-08-23 DIAGNOSIS — Z5112 Encounter for antineoplastic immunotherapy: Secondary | ICD-10-CM | POA: Diagnosis not present

## 2021-08-23 LAB — COMPREHENSIVE METABOLIC PANEL
ALT: 23 U/L (ref 0–44)
AST: 36 U/L (ref 15–41)
Albumin: 3.9 g/dL (ref 3.5–5.0)
Alkaline Phosphatase: 92 U/L (ref 38–126)
Anion gap: 11 (ref 5–15)
BUN: 11 mg/dL (ref 8–23)
CO2: 23 mmol/L (ref 22–32)
Calcium: 9 mg/dL (ref 8.9–10.3)
Chloride: 104 mmol/L (ref 98–111)
Creatinine, Ser: 0.92 mg/dL (ref 0.44–1.00)
GFR, Estimated: >60 mL/min (ref >60)
Glucose, Bld: 169 mg/dL — ABNORMAL HIGH (ref 70–99)
Potassium: 3.2 mmol/L — ABNORMAL LOW (ref 3.5–5.1)
Sodium: 138 mmol/L (ref 135–145)
Total Bilirubin: 0.4 mg/dL (ref 0.3–1.2)
Total Protein: 7.4 g/dL (ref 6.5–8.1)

## 2021-08-23 LAB — CBC WITH DIFFERENTIAL/PLATELET
Abs Immature Granulocytes: 0.1 10*3/uL — ABNORMAL HIGH (ref 0.00–0.07)
Basophils Absolute: 0.1 10*3/uL (ref 0.0–0.1)
Basophils Relative: 1 %
Eosinophils Absolute: 0.1 10*3/uL (ref 0.0–0.5)
Eosinophils Relative: 1 %
HCT: 30.7 % — ABNORMAL LOW (ref 36.0–46.0)
Hemoglobin: 10.1 g/dL — ABNORMAL LOW (ref 12.0–15.0)
Immature Granulocytes: 2 %
Lymphocytes Relative: 25 %
Lymphs Abs: 1.6 10*3/uL (ref 0.7–4.0)
MCH: 28.4 pg (ref 26.0–34.0)
MCHC: 32.9 g/dL (ref 30.0–36.0)
MCV: 86.2 fL (ref 80.0–100.0)
Monocytes Absolute: 0.7 10*3/uL (ref 0.1–1.0)
Monocytes Relative: 11 %
Neutro Abs: 3.9 10*3/uL (ref 1.7–7.7)
Neutrophils Relative %: 60 %
Platelets: 233 10*3/uL (ref 150–400)
RBC: 3.56 MIL/uL — ABNORMAL LOW (ref 3.87–5.11)
RDW: 21.1 % — ABNORMAL HIGH (ref 11.5–15.5)
WBC: 6.4 10*3/uL (ref 4.0–10.5)
nRBC: 0 % (ref 0.0–0.2)

## 2021-08-23 MED ORDER — HEPARIN SOD (PORK) LOCK FLUSH 100 UNIT/ML IV SOLN
500.0000 [IU] | Freq: Once | INTRAVENOUS | Status: AC | PRN
  Administered 2021-08-23: 500 [IU]
  Filled 2021-08-23: qty 5

## 2021-08-23 MED ORDER — SODIUM CHLORIDE 0.9 % IV SOLN
60.0000 mg/m2 | Freq: Once | INTRAVENOUS | Status: AC
  Administered 2021-08-23: 120 mg via INTRAVENOUS
  Filled 2021-08-23: qty 20

## 2021-08-23 MED ORDER — SODIUM CHLORIDE 0.9 % IV SOLN
Freq: Once | INTRAVENOUS | Status: AC
  Filled 2021-08-23: qty 250

## 2021-08-23 MED ORDER — FAMOTIDINE IN NACL 20-0.9 MG/50ML-% IV SOLN
20.0000 mg | Freq: Once | INTRAVENOUS | Status: AC
  Administered 2021-08-23: 20 mg via INTRAVENOUS
  Filled 2021-08-23: qty 50

## 2021-08-23 MED ORDER — SODIUM CHLORIDE 0.9% FLUSH
10.0000 mL | Freq: Once | INTRAVENOUS | Status: AC
  Administered 2021-08-23: 10 mL via INTRAVENOUS
  Filled 2021-08-23: qty 10

## 2021-08-23 MED ORDER — HEPARIN SOD (PORK) LOCK FLUSH 100 UNIT/ML IV SOLN
INTRAVENOUS | Status: AC
  Filled 2021-08-23: qty 5

## 2021-08-23 MED ORDER — SODIUM CHLORIDE 0.9 % IV SOLN
10.0000 mg | Freq: Once | INTRAVENOUS | Status: AC
  Administered 2021-08-23: 10 mg via INTRAVENOUS
  Filled 2021-08-23: qty 10

## 2021-08-23 MED ORDER — DIPHENHYDRAMINE HCL 50 MG/ML IJ SOLN
50.0000 mg | Freq: Once | INTRAMUSCULAR | Status: AC
  Administered 2021-08-23: 50 mg via INTRAVENOUS
  Filled 2021-08-23: qty 1

## 2021-08-23 NOTE — Assessment & Plan Note (Addendum)
#  Recurrent right breast cancer-s/p breast skin biopsy-week ER positive /PR HER2 negative- recurrent- stage IV; NGS -FEB 2023- CPS =15%. Currently on Keytruda- taxol [2/13]; CT CHEST:  Since the PET of 04/15/2021, enlargement of the previously described hypermetabolic right low cervical/supraclavicular nodal metastasis. Foci of hyperenhancement within the right breast are new or more well-defined since 02/02/2021, suspicious for progressive residual/recurrent disease. Nonspecific mediastinal nodes, similar to slightly decreased in size;  New sclerosis at the site of a known T2 vertebral body metastasis.   # Proceed with Chemo- cycle #4; day-15 [at reduced dose of Taxol to 60 mg per metered square; Labs today reviewed;  acceptable for treatment today.  Palliative radiation therapy to her right supraclavicular nodal metastasis as well as T2 vertebral body.  Planning 30 Gray in 10 fractions [5/31 to 6/12].   #Acute diarrhea [1 week]- multiple loose stools resolved after 4-5 days with immodium [last immoiudm on 5/28].  The etiology is unclear-question viral gastroenteritis versus Keytruda.  However resolved without any steroid intervention.  Plan hold steroid this time.  Also hold Keytruda next week.  Again reiterated to the patient to call us if she has diarrhea again.  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   # Left Knee- Bil-likely secondary to osteoarthritis less likely from [facility]. ? [facility]  STABLE.  # T2 thoracic metastatic disease [confirmed MRI Jan 2023]- awaiting to start Radiation patient wants to hold off for now. S/p  Zometa treatment- STABLE.   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   # PN-1-2 sec to taxol-on Neurontin 200 qhs-  # DM/poorly controlled- continue metformin 2000 mg q AM [Dr.Tullo]; on glipizide 10 mg XL. Continue follow up with  PCP.  On CGM.  Stable.  # Mediport:  Functioning.   Key-3week; Taxol-weekx3;1 w-off  # DISPOSITION: # chemo today; # CANCEL appt next week- labs- cbc/cmp;Ketruda # Follow up 2 weeks/June 12th-- MD; labs- cbc/cmp; taxol-Dr.B

## 2021-08-23 NOTE — Patient Instructions (Signed)
[facility] [place]  Discharge Instructions: Thank you for choosing [facility] to provide your oncology and hematology care.  If you have a lab appointment with the Cancer Center, please go directly to the Cancer Center and check in at the registration area.  Wear comfortable clothing and clothing appropriate for easy access to any Portacath or PICC line.   We strive to give you quality time with your provider. You may need to reschedule your appointment if you arrive late (15 or more minutes).  Arriving late affects you and other patients whose appointments are after yours.  Also, if you miss three or more appointments without notifying the office, you may be dismissed from the clinic at the provider's discretion.      For prescription refill requests, have your pharmacy contact our office and allow 72 hours for refills to be completed.    Today you received the following chemotherapy and/or immunotherapy agents Taxol      To help prevent nausea and vomiting after your treatment, we encourage you to take your nausea medication as directed.  BELOW ARE SYMPTOMS THAT SHOULD BE REPORTED IMMEDIATELY: *FEVER GREATER THAN 100.4 F (38 C) OR HIGHER *CHILLS OR SWEATING *NAUSEA AND VOMITING THAT IS NOT CONTROLLED WITH YOUR NAUSEA MEDICATION *UNUSUAL SHORTNESS OF BREATH *UNUSUAL BRUISING OR BLEEDING *URINARY PROBLEMS (pain or burning when urinating, or frequent urination) *BOWEL PROBLEMS (unusual diarrhea, constipation, pain near the anus) TENDERNESS IN MOUTH AND THROAT WITH OR WITHOUT PRESENCE OF ULCERS (sore throat, sores in mouth, or a toothache) UNUSUAL RASH, SWELLING OR PAIN  UNUSUAL VAGINAL DISCHARGE OR ITCHING   Items with * indicate a potential emergency and should be followed up as soon as possible or go to the Emergency Department if any problems should occur.  Please show the CHEMOTHERAPY ALERT CARD or IMMUNOTHERAPY ALERT CARD at check-in to the  Emergency Department and triage nurse.  Should you have questions after your visit or need to cancel or reschedule your appointment, please contact [facility] [place]  [phone] and follow the prompts.  Office hours are 8:00 a.m. to 4:30 p.m. Monday - Friday. Please note that voicemails left after 4:00 p.m. may not be returned until the following business day.  We are closed weekends and major holidays. You have access to a nurse at all times for urgent questions. Please call the main number to the clinic [phone] and follow the prompts.  For any non-urgent questions, you may also contact your provider using MyChart. We now offer e-Visits for anyone 18 and older to request care online for non-urgent symptoms. For details visit [email or web address].   Also download the MyChart app! Go to the app store, search "MyChart", open the app, select [place], and log in with your MyChart username and password.  Due to Covid, a mask is required upon entering the hospital/clinic. If you do not have a mask, one will be given to you upon arrival. For doctor visits, patients may have 1 support person aged 18 or older with them. For treatment visits, patients cannot have anyone with them due to current Covid guidelines and our immunocompromised population.  

## 2021-08-23 NOTE — Progress Notes (Signed)
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 Byrnett.] Ashlee Cox Carbo-taxol-AC;  JAN 2022-s/p Lumpectomy & ALND- [ypT1a (47m ) ypN1 (2/11 LN-positive)]; s/p radiation end of April 2022.  #Early June 2022-Xeloda 1000 mg per metered square [2000] 2 weeks on 1 week of 6-8 cycles; July 11th 2022- STARTING cycle #3-cut down the dose to 1500 mg BID[sec to HFS].   # JAN 2023- A. SKIN, RIGHT BREAST; PUNCH BIOPSY: [Dr.Byrnett] - INVASIVE CARCINOMA WITH DERMAL INFILTRATION AND LYMPHATIC INVASION. ER- weak POSISTIVE [10%]; PR/her2 NEG [[facility] 0]; JAN 20th, 2023- IMPRESSION: 1. Although the hypermetabolic right axillary node has resolved since the prior PET, there is a new hypermetabolic right low cervical/supraclavicular node, suspicious for nodal metastasis. 2. Decrease in right breast hypermetabolism with nonspecific overlying skin thickening in the setting of prior lumpectomy. 3. Low-level hypermetabolism within the T2 vertebral body, suspicious for isolated osseous metastasis. This could be confirmed with pre and postcontrast thoracic spine MRI. 4. Hypermetabolic thoracic nodes, slightly progressive. Given progression, nodal metastasis are slightly favored over reactive etiologies. 5. Incidental findings, including: Aortic Atherosclerosis (ICD10-I70.0). Tiny hiatal hernia. 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 No pathogenic variants identified on the [facility] Common Hereditary Cancers Panel + Skin Cancers Panel. The report date is 12/04/2019.  The Common Hereditary Cancers Panel + Skin Cancers Panel offered by [facility] includes sequencing and/or deletion duplication testing of the following 54 genes: APC*, ATM*, AXIN2, BAP1, BARD1, BMPR1A, BRCA1, BRCA2, BRIP1, CDH1, CDK4, CDKN2A (p14ARF), CDKN2A (p16INK4a), CHEK2, CTNNA1, DICER1*, EPCAM*, GREM1*, HOXB13, KIT, MEN1*, MITF*, MLH1*, MSH2*, MSH3*, MSH6*, MUTYH, NBN, NF1*, NTHL1, PALB2, PDGFRA, PMS2*, POLD1*, POLE, POT1, PTCH1, PTEN*, RAD50, RAD51C, RAD51D, RB1*, RNF43, SDHA*, SDHB, SDHC*, SDHD, SMAD4, SMARCA4, STK11, SUFU, TP53, TSC1*, TSC2, VHL.    05/07/2020 Cancer Staging   Staging form: Breast, AJCC 8th Edition - Clinical: Stage IIIB (cT2, cN1, cM0, G3, ER-, PR-, HER2-) - Signed by BCammie Sickle MD on 05/07/2020    04/18/2021 Cancer Staging   Staging form: Breast, AJCC 8th Edition - Pathologic: Stage IV (pT4d, pN3c, cM1, ER+, PR-, HER2-) - Signed by BCammie Sickle MD on 04/18/2021 Nuclear grade: G3       HISTORY OF PRESENTING ILLNESS: Accompanied by husband.  Walking independently.  CCabella Cox 71y.o.  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   At the  last visit patient was diagnosed with hypothyroidism.  Started  on Synthroid.  In the interim evaluated by Dr. Donella Stade radiation oncology.  Patient is awaiting to start radiation to the T2 vertebral body/right supraclavicular lymph nodes.  In the interim patient had episode of diarrhea that lasted about 4 to 5 days.  Multiple loose stools.  Improved with Imodium.  Denies any sick contacts.  Currently resolved.   Patient complains of mild tingling and numbness in extremities.   No falls.  Patient admits to resolution of the breast skin rash.  Complains of mild to moderate fatigue.   Review of Systems  Constitutional:  Positive for malaise/fatigue. Negative for chills, diaphoresis, fever and weight loss.  HENT:  Negative for nosebleeds and sore throat.   Eyes:  Negative for double vision.  Respiratory:  Negative for cough, hemoptysis, sputum production and wheezing.   Cardiovascular:  Negative for chest pain, palpitations, orthopnea and leg swelling.  Gastrointestinal:  Negative for abdominal pain, blood in stool, constipation and melena.  Genitourinary:  Negative for dysuria, frequency and urgency.  Musculoskeletal:  Positive for back pain and neck pain. Negative for joint pain.  Neurological:  Positive for tingling. Negative for dizziness, focal weakness, weakness and headaches.  Endo/Heme/Allergies:  Does not bruise/bleed easily.  Psychiatric/Behavioral:  Negative for depression. The patient is not nervous/anxious and does not have insomnia.     MEDICAL HISTORY:  Past Medical History:  Diagnosis Date   Cancer [facility]) 2021   right breast   Cyst, breast    benign   Diabetes mellitus without complication ([place])    Family history of melanoma    Family history of ovarian cancer    GERD (gastroesophageal reflux disease)    H/O: rheumatic fever    History of colonoscopy 2010   done bc of bleeding,  normal , due back in 5 yrs ([place])   Hyperlipidemia    Hypertension    Menopause    at  age 17   Personal history of chemotherapy 09/2019   RIGHT  INVASIVE MAMMARY CARCINOMA   Personal history of radiation therapy     SURGICAL HISTORY: Past Surgical History:  Procedure Laterality Date   APPENDECTOMY  2006   BREAST BIOPSY Right 09/22/2019   INVASIVE MAMMARY CARCINOMA   BREAST CYST ASPIRATION Left 1999   BREAST LUMPECTOMY Left 04/23/2020   surgery with NL and SN    BREAST LUMPECTOMY WITH NEEDLE LOCALIZATION Right 04/23/2020   Procedure: BREAST LUMPECTOMY WITH NEEDLE LOCALIZATION;  Surgeon: Robert Bellow, MD;  Location: [facility] ORS;  Service: General;  Laterality: Right;   BREAST LUMPECTOMY WITH SENTINEL LYMPH NODE BIOPSY Right 04/23/2020   Procedure: BREAST LUMPECTOMY WITH SENTINEL LYMPH NODE BX;  Surgeon: Robert Bellow, MD;  Location: [facility] ORS;  Service: General;  Laterality: Right;   CHOLECYSTECTOMY  2006   COLONOSCOPY     PORTACATH PLACEMENT Left 10/06/2019   Procedure: INSERTION PORT-A-CATH;  Surgeon: Robert Bellow, MD;  Location: [facility] ORS;  Service: General;  Laterality: Left;   SUBMUCOSAL TATTOO INJECTION Right 10/06/2019   Procedure: Right axillary TATTOO INJECTION;  Surgeon: Robert Bellow, MD;  Location: [facility] ORS;  Service: General;  Laterality: Right;   VAGINAL DELIVERY     x3    SOCIAL HISTORY: Social History   Socioeconomic History   Marital status: Married    Spouse name: Chrissie Noa   Number of children: 3   Years of education: Not on file   Highest education level: Not on file  Occupational History   Occupation: Clinical research associate  Employer: [id number]  Tobacco Use   Smoking status: Former    Years: 1.00    Types: Cigarettes    Quit date: 12/26/2005    Years since quitting: 15.6   Smokeless tobacco: Never   Tobacco comments:    smoked for less than 1 years,  1 cig/day  Vaping Use   Vaping Use: Never used  Substance and Sexual Activity   Alcohol use: No   Drug use: No   Sexual activity: Never    Partners: Female  Other Topics Concern    Not on file  Social History Narrative   Widowed, March 2014; remarried.      [employer] ret[employer]; quit smoking 1ppw; no alcohol.       Social Determinants of Health   Financial Resource Strain: Medium Risk   Difficulty of Paying Living Expenses: Somewhat hard  Food Insecurity: No Food Insecurity   Worried About Charity fundraiser in the Last Year: Never true   Ran Out of Food in the Last Year: Never true  Transportation Needs: No Transportation Needs   Lack of Transportation (Medical): No   Lack of Transportation (Non-Medical): No  Physical Activity: Inactive   Days of Exercise per Week: 0 days   Minutes of Exercise per Session: 0 min  Stress: No Stress Concern Present   Feeling of Stress : Not at all  Social Connections: Socially Integrated   Frequency of Communication with Friends and Family: More than three times a week   Frequency of Social Gatherings with Friends and Family: More than three times a week   Attends Religious Services: More than 4 times per year   Active Member of [employer] or Organizations: Yes   Attends Music therapist: More than 4 times per year   Marital Status: Married  Human resources officer Violence: Not At Risk   Fear of Current or Ex-Partner: No   Emotionally Abused: No   Physically Abused: No   Sexually Abused: No    FAMILY HISTORY: Family History  Problem Relation Age of Onset   Cancer Mother 74       ovarian- died 4-5 years.    Heart disease Mother 25   Diabetes Mother    Stroke Father 81   Diabetes Father    Diabetes Sister    Melanoma Sister        survived   Breast cancer Neg Hx     ALLERGIES:  has No Known Allergies.  MEDICATIONS:  Current Outpatient Medications  Medication Sig Dispense Refill   acetaminophen (TYLENOL) 500 MG tablet Take 500 mg by mouth every 8 (eight) hours as needed for moderate pain.     calcium carbonate (OS-CAL) 600 MG TABS tablet Take 600 mg by mouth daily.     Continuous Blood Gluc Sensor (FREESTYLE  LIBRE 3 SENSOR) MISC Apply 1 each topically every 14 (fourteen) days. Place 1 sensor on the skin every 14 days. Use to check glucose continuously 6 each 3   diphenhydrAMINE HCl, Sleep, (ZZZQUIL PO) Take 1 tablet by mouth at bedtime as needed.     gabapentin (NEURONTIN) 100 MG capsule Take 2 capsules (200 mg total) by mouth at bedtime. 180 capsule 0   glipiZIDE (GLUCOTROL XL) 10 MG 24 hr tablet Take 1 tablet (10 mg total) by mouth daily with breakfast. 90 tablet 1   levothyroxine (SYNTHROID) 100 MCG tablet Take 1 tablet (100 mcg total) by mouth daily before breakfast. 30 tablet 3   lidocaine-prilocaine (EMLA) cream Apply 1  application topically as needed. Apply to port and cover with saran wrap 1-2 hours prior to port access 30 g 1   losartan-hydrochlorothiazide (HYZAAR) 50-12.5 MG tablet Take 1 tablet by mouth once daily 90 tablet 0   lovastatin (MEVACOR) 40 MG tablet TAKE 1 TABLET BY MOUTH AT BEDTIME 90 tablet 0   metFORMIN (GLUCOPHAGE) 1000 MG tablet TAKE 2 TABLETS BY MOUTH ONCE DAILY WITH BREAKFAST 180 tablet 0   Multiple Vitamin (MULTIVITAMIN) tablet Take 1 tablet by mouth daily.     Multiple Vitamins-Minerals (MULTI-VITAMIN GUMMIES) CHEW Chew 1 tablet by mouth 1 day or 1 dose.     omeprazole (PRILOSEC) 20 MG capsule Take 1 capsule by mouth once daily 90 capsule 0   potassium chloride SA (KLOR-CON M) 20 MEQ tablet Take 1 tablet (20 mEq total) by mouth 2 (two) times daily. 60 tablet 2   Semaglutide,0.25 or 0.5MG/DOS, (OZEMPIC, 0.25 OR 0.5 MG/DOSE,) 2 MG/1.5ML SOPN Inject 0.5 mg into the skin once a week. 1.5 mL 11   No current facility-administered medications for this visit.      Marland Kitchen  PHYSICAL EXAMINATION: ECOG PERFORMANCE STATUS: 0 - Asymptomatic  Vitals:   08/23/21 1325  BP: 132/63  Pulse: 99  Temp: 98.5 F (36.9 C)  SpO2: 100%   Filed Weights   08/23/21 1325  Weight: 189 lb 6.4 oz (85.9 kg)   Right supraclavicular lymphadenopathy felt. 2-3 cm in size.   Physical Exam HENT:      Head: Normocephalic and atraumatic.     Mouth/Throat:     Pharynx: No oropharyngeal exudate.  Eyes:     Pupils: Pupils are equal, round, and reactive to light.  Cardiovascular:     Rate and Rhythm: Normal rate and regular rhythm.  Pulmonary:     Effort: Pulmonary effort is normal. No respiratory distress.     Breath sounds: Normal breath sounds. No wheezing.  Abdominal:     General: Bowel sounds are normal. There is no distension.     Palpations: Abdomen is soft. There is no mass.     Tenderness: There is no abdominal tenderness. There is no guarding or rebound.  Musculoskeletal:        General: No tenderness. Normal range of motion.     Cervical back: Normal range of motion and neck supple.  Skin:    General: Skin is warm.  Neurological:     Mental Status: She is alert and oriented to person, place, and time.  Psychiatric:        Mood and Affect: Affect normal.     LABORATORY DATA:  I have reviewed the data as listed Lab Results  Component Value Date   WBC 6.4 08/23/2021   HGB 10.1 (L) 08/23/2021   HCT 30.7 (L) 08/23/2021   MCV 86.2 08/23/2021   PLT 233 08/23/2021   Recent Labs    08/08/21 0909 08/15/21 0757 08/23/21 1331  NA 135 137 138  K 3.9 4.0 3.2*  CL 103 104 104  CO2 24 24 23   GLUCOSE 146* 109* 169*  BUN 14 19 11   CREATININE 1.01* 0.98 0.92  CALCIUM 9.5 10.1 9.0  GFRNONAA >60 >60 >60  PROT 7.3 8.3* 7.4  ALBUMIN 3.8 4.3 3.9  AST 40 50* 36  ALT 21 37 23  ALKPHOS 82 93 92  BILITOT 0.6 0.7 0.4    RADIOGRAPHIC STUDIES: I have personally reviewed the radiological images as listed and agreed with the findings in the report. CT SOFT TISSUE NECK  W CONTRAST  Result Date: 07/26/2021 CLINICAL DATA:  Neck mass, nonpulsatile neck swelling/lymph nodes; breast cancer on chemo EXAM: CT NECK WITH CONTRAST TECHNIQUE: Multidetector CT imaging of the neck was performed using the standard protocol following the bolus administration of intravenous contrast.  RADIATION DOSE REDUCTION: This exam was performed according to the departmental dose-optimization program which includes automated exposure control, adjustment of the mA and/or kV according to patient size and/or use of iterative reconstruction technique. CONTRAST:  124m OMNIPAQUE IOHEXOL 300 MG/ML  SOLN COMPARISON:  PET-CT 04/15/2021, MRI thoracic spine 04/18/2021 FINDINGS: Pharynx and larynx: Unremarkable.  No mass or swelling. Salivary glands: Parotid and submandibular glands are unremarkable. Thyroid: Unremarkable. Lymph nodes: Level 5/supraclavicular mildly heterogeneous node with foci of cystic change or necrosis measuring 1.4 x 2.4 cm. This has increased in size since PET-CT of January. No additional enlarged or abnormal density nodes. Vascular: Major neck vessels are patent. Limited intracranial: No abnormal enhancement. Visualized orbits: Unremarkable. Mastoids and visualized paranasal sinuses: No significant opacification. Skeleton: T2 metastasis with pathologic fracture. The height loss is new from the thoracic spine MRI January. Upper chest: Dictated separately. Other: Skin marker overlies the posterolateral right neck at the level of the chin. This is in proximity to mass described above. IMPRESSION: Right neck mass reflects a level 5/supraclavicular lymph node has increased in size since PET CT of January 2023. This was hypermetabolic on that study. Known T2 metastasis with interval pathologic compression fracture. Electronically Signed   By: PMacy MisM.D.   On: 07/26/2021 17:05   CT CHEST ABDOMEN PELVIS W CONTRAST  Result Date: 07/27/2021 CLINICAL DATA:  Stage IV breast cancer. Evaluate treatment response. * Tracking Code: BO * EXAM: CT CHEST, ABDOMEN, AND PELVIS WITH CONTRAST TECHNIQUE: Multidetector CT imaging of the chest, abdomen and pelvis was performed following the standard protocol during bolus administration of intravenous contrast. RADIATION DOSE REDUCTION: This exam was performed  according to the departmental dose-optimization program which includes automated exposure control, adjustment of the mA and/or kV according to patient size and/or use of iterative reconstruction technique. CONTRAST:  1034mOMNIPAQUE IOHEXOL 300 MG/ML  SOLN COMPARISON:  04/15/2021 PET FINDINGS: CT CHEST FINDINGS Cardiovascular: Left Port-A-Cath tip high right atrium. Aortic atherosclerosis. Normal heart size, without pericardial effusion. No central pulmonary embolism, on this non-dedicated study. Mediastinum/Nodes: The right supraclavicular/posterior triangle node on the prior PET measures 1.8 cm on 04/04 today versus 1.4 cm on that exam (when remeasured). No axillary adenopathy. Prominent mediastinal nodes. Example AP window 11 mm node on 27/4, 12 mm on the prior CT. No hilar adenopathy. Tiny hiatal hernia. No internal mammary adenopathy. Lungs/Pleura: No pleural fluid. Biapical pleuroparenchymal scarring. Musculoskeletal: Right breast skin thickening. Areas of residual parenchymal hyperenhancement including laterally at 2.0 cm on 33/4 and deep to the areola at 1.3 cm on 28/4 are new or more well-defined than on the prior exam. New sclerosis at the T2 vertebral body, at the site of known metastasis on prior PET and thoracic spine MRI. CT ABDOMEN PELVIS FINDINGS Hepatobiliary: Caudate and lateral segment left liver lobe enlargement, suspicious for mild cirrhosis. No focal liver lesion. Cholecystectomy, without biliary ductal dilatation. Pancreas: Normal, without mass or ductal dilatation. Spleen: Normal in size, without focal abnormality. Adrenals/Urinary Tract: Normal adrenal glands. Bilateral too small to characterize renal lesions. No hydronephrosis. Normal urinary bladder. Stomach/Bowel: The proximal stomach appears thick walled including on 62/4. Normal colon and terminal ileum. Appendectomy. Normal small bowel. Vascular/Lymphatic: Aortic atherosclerosis. No abdominopelvic adenopathy. Reproductive: Normal  uterus  and adnexa. Other: No significant free fluid. No evidence of omental or peritoneal disease. 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 IMPRESSION: CT CHEST IMPRESSION 1. Since the PET of 04/15/2021, enlargement of the previously described hypermetabolic right low cervical/supraclavicular nodal metastasis. 2. Foci of hyperenhancement within the right breast are new or more well-defined since 02/02/2021, suspicious for progressive residual/recurrent disease. 3. Nonspecific mediastinal nodes, similar to slightly decreased in size. 4. New sclerosis at the site of a known T2 vertebral body metastasis. 5. Small hiatal hernia. CT ABDOMEN AND PELVIS IMPRESSION 1. No acute process or evidence of metastatic disease in the abdomen or pelvis. 2. Hepatic morphology which favors mild cirrhosis. Correlate with risk factors. 3. Mild proximal gastric wall thickening, suspicious for gastritis. 4.  Aortic Atherosclerosis (ICD10-I70.0). Electronically Signed   By: Abigail Miyamoto M.D.   On: 07/27/2021 09:50    ASSESSMENT & PLAN:   Carcinoma of upper-inner quadrant of right breast in female, estrogen receptor negative ([place]) #Recurrent right breast cancer-s/p breast skin biopsy-week ER positive /PR HER2 negative- recurrent- stage IV; NGS -FEB 2023- CPS =15%. Currently on Keytruda- taxol [2/13]; CT CHEST:  Since the PET of 04/15/2021, enlargement of the previously described hypermetabolic right low cervical/supraclavicular nodal metastasis. Foci of hyperenhancement within the right breast are new or more well-defined since 02/02/2021, suspicious for progressive residual/recurrent disease. Nonspecific mediastinal nodes, similar to slightly decreased in size;  New sclerosis at the site of a known T2 vertebral body metastasis.   # Proceed with Chemo- cycle #4; day-15 [at reduced dose of Taxol to 60 mg per metered square; Labs today reviewed;  acceptable for treatment today.  Palliative  radiation therapy to her right supraclavicular nodal metastasis as well as T2 vertebral body.  Planning 30 Gray in 10 fractions [5/31 to 6/12].   #Acute diarrhea [1 week]- multiple loose stools resolved after 4-5 days with immodium [last immoiudm on 5/28].  The etiology is unclear-question viral gastroenteritis versus Keytruda.  However resolved without any steroid intervention.  Plan hold steroid this time.  Also hold Keytruda next week.  Again reiterated to the patient to call us if she has diarrhea again.  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 ? [facility]  STABLE.  # T2 thoracic metastatic disease [confirmed MRI Jan 2023]- awaiting to start Radiation patient wants to hold off for now. S/p  Zometa treatment- STABLE.   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   # PN-1-2 sec to taxol-on Neurontin 200 qhs-  # DM/poorly controlled- continue metformin 2000 mg q AM [Dr.Tullo]; on glipizide 10 mg XL. Continue follow up with  PCP.  On CGM.  Stable.  # Mediport: Functioning.   Key-3week; Taxol-weekx3;1 w-off  # DISPOSITION: # chemo today; # CANCEL appt next week- labs- cbc/cmp;Ketruda # Follow up 2 weeks/June 12th-- MD; labs- cbc/cmp; taxol-Dr.B     All questions were answered. The patient/family knows to call the clinic with any problems, questions or concerns.   Ashlee Sickle, MD 08/23/2021 9:08 PM

## 2021-08-24 ENCOUNTER — Ambulatory Visit: Payer: PPO | Admitting: Internal Medicine

## 2021-08-24 ENCOUNTER — Ambulatory Visit: Payer: PPO

## 2021-08-24 ENCOUNTER — Ambulatory Visit
Admission: RE | Admit: 2021-08-24 | Discharge: 2021-08-24 | Disposition: A | Discharge status: Home / Self Care | Payer: PPO | Source: Ambulatory Visit | Attending: Radiation Oncology | Admitting: Radiation Oncology

## 2021-08-24 ENCOUNTER — Other Ambulatory Visit: Payer: Self-pay

## 2021-08-24 ENCOUNTER — Other Ambulatory Visit: Payer: PPO

## 2021-08-24 DIAGNOSIS — C50211 Malignant neoplasm of upper-inner quadrant of right female breast: Secondary | ICD-10-CM | POA: Diagnosis not present

## 2021-08-24 DIAGNOSIS — C7951 Secondary malignant neoplasm of bone: Secondary | ICD-10-CM | POA: Diagnosis not present

## 2021-08-24 DIAGNOSIS — Z5112 Encounter for antineoplastic immunotherapy: Secondary | ICD-10-CM | POA: Diagnosis not present

## 2021-08-24 DIAGNOSIS — Z171 Estrogen receptor negative status [ER-]: Secondary | ICD-10-CM | POA: Diagnosis not present

## 2021-08-24 LAB — RAD ONC ARIA SESSION SUMMARY
Course Elapsed Days: 0
Plan Fractions Treated to Date: 1
Plan Prescribed Dose Per Fraction: 3 Gy
Plan Total Fractions Prescribed: 10
Plan Total Prescribed Dose: 30 Gy
Reference Point Dosage Given to Date: 3 Gy
Reference Point Session Dosage Given: 3 Gy
Session Number: 1

## 2021-08-25 ENCOUNTER — Inpatient Hospital Stay: Payer: PPO

## 2021-08-25 ENCOUNTER — Other Ambulatory Visit: Payer: Self-pay

## 2021-08-25 ENCOUNTER — Ambulatory Visit
Admission: RE | Admit: 2021-08-25 | Discharge: 2021-08-25 | Disposition: A | Discharge status: Home / Self Care | Payer: PPO | Source: Ambulatory Visit | Attending: Radiation Oncology | Admitting: Radiation Oncology

## 2021-08-25 ENCOUNTER — Inpatient Hospital Stay (HOSPITAL_BASED_OUTPATIENT_CLINIC_OR_DEPARTMENT_OTHER): Payer: PPO | Admitting: Hospice and Palliative Medicine

## 2021-08-25 DIAGNOSIS — Z5112 Encounter for antineoplastic immunotherapy: Secondary | ICD-10-CM | POA: Insufficient documentation

## 2021-08-25 DIAGNOSIS — R197 Diarrhea, unspecified: Secondary | ICD-10-CM | POA: Insufficient documentation

## 2021-08-25 DIAGNOSIS — C7951 Secondary malignant neoplasm of bone: Secondary | ICD-10-CM | POA: Insufficient documentation

## 2021-08-25 DIAGNOSIS — Z510 Encounter for antineoplastic radiation therapy: Secondary | ICD-10-CM | POA: Diagnosis not present

## 2021-08-25 DIAGNOSIS — Z171 Estrogen receptor negative status [ER-]: Secondary | ICD-10-CM | POA: Insufficient documentation

## 2021-08-25 DIAGNOSIS — Z95828 Presence of other vascular implants and grafts: Secondary | ICD-10-CM | POA: Insufficient documentation

## 2021-08-25 DIAGNOSIS — Z5111 Encounter for antineoplastic chemotherapy: Secondary | ICD-10-CM | POA: Insufficient documentation

## 2021-08-25 DIAGNOSIS — D4861 Neoplasm of uncertain behavior of right breast: Secondary | ICD-10-CM | POA: Diagnosis not present

## 2021-08-25 DIAGNOSIS — C50211 Malignant neoplasm of upper-inner quadrant of right female breast: Secondary | ICD-10-CM

## 2021-08-25 DIAGNOSIS — E1165 Type 2 diabetes mellitus with hyperglycemia: Secondary | ICD-10-CM | POA: Insufficient documentation

## 2021-08-25 DIAGNOSIS — E032 Hypothyroidism due to medicaments and other exogenous substances: Secondary | ICD-10-CM | POA: Insufficient documentation

## 2021-08-25 DIAGNOSIS — C770 Secondary and unspecified malignant neoplasm of lymph nodes of head, face and neck: Secondary | ICD-10-CM | POA: Insufficient documentation

## 2021-08-25 DIAGNOSIS — G620 Drug-induced polyneuropathy: Secondary | ICD-10-CM | POA: Insufficient documentation

## 2021-08-25 DIAGNOSIS — Z79890 Hormone replacement therapy (postmenopausal): Secondary | ICD-10-CM | POA: Insufficient documentation

## 2021-08-25 DIAGNOSIS — Z452 Encounter for adjustment and management of vascular access device: Secondary | ICD-10-CM | POA: Insufficient documentation

## 2021-08-25 DIAGNOSIS — Z170 Estrogen receptor positive status [ER+]: Secondary | ICD-10-CM | POA: Insufficient documentation

## 2021-08-25 DIAGNOSIS — E876 Hypokalemia: Secondary | ICD-10-CM | POA: Insufficient documentation

## 2021-08-25 LAB — COMPREHENSIVE METABOLIC PANEL
ALT: 22 U/L (ref 0–44)
AST: 38 U/L (ref 15–41)
Albumin: 3.9 g/dL (ref 3.5–5.0)
Alkaline Phosphatase: 94 U/L (ref 38–126)
Anion gap: 10 (ref 5–15)
BUN: 12 mg/dL (ref 8–23)
CO2: 23 mmol/L (ref 22–32)
Calcium: 9 mg/dL (ref 8.9–10.3)
Chloride: 104 mmol/L (ref 98–111)
Creatinine, Ser: 0.79 mg/dL (ref 0.44–1.00)
GFR, Estimated: >60 mL/min (ref >60)
Glucose, Bld: 164 mg/dL — ABNORMAL HIGH (ref 70–99)
Potassium: 3.3 mmol/L — ABNORMAL LOW (ref 3.5–5.1)
Sodium: 137 mmol/L (ref 135–145)
Total Bilirubin: 0.6 mg/dL (ref 0.3–1.2)
Total Protein: 7.5 g/dL (ref 6.5–8.1)

## 2021-08-25 LAB — RAD ONC ARIA SESSION SUMMARY
Course Elapsed Days: 1
Plan Fractions Treated to Date: 2
Plan Prescribed Dose Per Fraction: 3 Gy
Plan Total Fractions Prescribed: 10
Plan Total Prescribed Dose: 30 Gy
Reference Point Dosage Given to Date: 6 Gy
Reference Point Session Dosage Given: 3 Gy
Session Number: 2

## 2021-08-25 LAB — GASTROINTESTINAL PANEL BY PCR, STOOL (REPLACES STOOL CULTURE)

## 2021-08-25 LAB — C DIFFICILE QUICK SCREEN W PCR REFLEX
C Diff antigen: NEGATIVE
C Diff interpretation: NOT DETECTED
C Diff toxin: NEGATIVE

## 2021-08-25 LAB — CBC WITH DIFFERENTIAL/PLATELET
Abs Immature Granulocytes: 0.04 10*3/uL (ref 0.00–0.07)
Basophils Absolute: 0.1 10*3/uL (ref 0.0–0.1)
Basophils Relative: 1 %
Eosinophils Absolute: 0 10*3/uL (ref 0.0–0.5)
Eosinophils Relative: 1 %
HCT: 29.6 % — ABNORMAL LOW (ref 36.0–46.0)
Hemoglobin: 9.7 g/dL — ABNORMAL LOW (ref 12.0–15.0)
Immature Granulocytes: 1 %
Lymphocytes Relative: 22 %
Lymphs Abs: 1 10*3/uL (ref 0.7–4.0)
MCH: 28.5 pg (ref 26.0–34.0)
MCHC: 32.8 g/dL (ref 30.0–36.0)
MCV: 87.1 fL (ref 80.0–100.0)
Monocytes Absolute: 0.2 10*3/uL (ref 0.1–1.0)
Monocytes Relative: 5 %
Neutro Abs: 3.3 10*3/uL (ref 1.7–7.7)
Neutrophils Relative %: 70 %
Platelets: 187 10*3/uL (ref 150–400)
RBC: 3.4 MIL/uL — ABNORMAL LOW (ref 3.87–5.11)
RDW: 20.7 % — ABNORMAL HIGH (ref 11.5–15.5)
WBC: 4.7 10*3/uL (ref 4.0–10.5)
nRBC: 0 % (ref 0.0–0.2)

## 2021-08-25 LAB — MAGNESIUM: Magnesium: 1.5 mg/dL — ABNORMAL LOW (ref 1.7–2.4)

## 2021-08-25 MED ORDER — MAGNESIUM SULFATE 2 GM/50ML IV SOLN
2.0000 g | Freq: Once | INTRAVENOUS | Status: AC
  Administered 2021-08-25: 2 g via INTRAVENOUS
  Filled 2021-08-25: qty 50

## 2021-08-25 MED ORDER — POTASSIUM CHLORIDE 20 MEQ/100ML IV SOLN
20.0000 meq | Freq: Once | INTRAVENOUS | Status: AC
  Administered 2021-08-25: 20 meq via INTRAVENOUS

## 2021-08-25 MED ORDER — SODIUM CHLORIDE 0.9 % IV SOLN
INTRAVENOUS | Status: AC
  Filled 2021-08-25: qty 250

## 2021-08-25 MED ORDER — CIPROFLOXACIN HCL 500 MG PO TABS
500.0000 mg | ORAL_TABLET | Freq: Two times a day (BID) | ORAL | 0 refills | Status: DC
Start: 2021-08-25 — End: 2021-09-05

## 2021-08-25 MED ORDER — SODIUM CHLORIDE 0.9% FLUSH
10.0000 mL | Freq: Once | INTRAVENOUS | Status: AC
  Administered 2021-08-25: 10 mL via INTRAVENOUS
  Filled 2021-08-25: qty 10

## 2021-08-25 MED ORDER — HEPARIN SOD (PORK) LOCK FLUSH 100 UNIT/ML IV SOLN
500.0000 [IU] | Freq: Once | INTRAVENOUS | Status: AC
  Administered 2021-08-25: 500 [IU] via INTRAVENOUS
  Filled 2021-08-25: qty 5

## 2021-08-25 NOTE — Progress Notes (Signed)
Symptom Management Ashlee Cox at [facility] [phone] [phone]  Patient Care Team: [name], [name] Noreene Filbert, MD as Referring Physician (Radiation Oncology) Cammie Sickle, MD as Consulting Physician (Internal Medicine) Bary Castilla Forest Gleason, MD as Consulting Physician (General Surgery) Jeral Fruit, RN as Registered Nurse Ashlee Singleton, RN as [place], [place], [place] as Social Worker   Name of the patient: Ashlee Cox  [id number]  1951-01-13   Date of visit: 08/25/21  Reason for Consult: Ashlee Cox is a 71 y.o. female with multiple medical problems including triple negative right breast cancer, initially diagnosed in July 2021 at stage IIIb with recurrence and progression of stage IV in January 2020 with bony metastatic disease.  Patient is on treatment with [place] Taxol and [place].  She is also receiving XRT.  Patient last saw Dr. Rogue Bussing on 08/23/2021, at which time she was having intermittent diarrhea but improved on Imodium.  There was question of etiology with possible viral gastroenteritis versus Keytruda.  However, symptoms seem to resolve without any steroid intervention.  Patient received Taxol on 5/30.  Patient presents to [facility] today for evaluation of recurrent diarrhea.  Patient reports intermittent diarrhea over the past 2 weeks without nausea or vomiting.  She has multiple episodes of watery stools daily.  She did take Imodium last week and says that she had multiple days without having diarrhea but still had crampy gas pain.  She denies fever or chills.  No abdominal pain.  Diarrhea is nonbloody.    Denies any neurologic complaints. Denies recent fevers or illnesses. Denies any easy bleeding or bruising. Reports good appetite and denies weight loss. Denies chest pain. Denies urinary complaints. Patient offers  no further specific complaints today.  PAST MEDICAL HISTORY: Past Medical History:  Diagnosis Date   Cancer [facility]) 2021   right breast   Cyst, breast    benign   Diabetes mellitus without complication ([place])    Family history of melanoma    Family history of ovarian cancer    GERD (gastroesophageal reflux disease)    H/O: rheumatic fever    History of colonoscopy 2010   done bc of bleeding,  normal , due back in 5 yrs ([place])   Hyperlipidemia    Hypertension    Menopause    at age 71   Personal history of chemotherapy 09/2019   RIGHT  INVASIVE MAMMARY CARCINOMA   Personal history of radiation therapy     PAST SURGICAL HISTORY:  Past Surgical History:  Procedure Laterality Date   APPENDECTOMY  2006   BREAST BIOPSY Right 09/22/2019   INVASIVE MAMMARY CARCINOMA   BREAST CYST ASPIRATION Left 1999   BREAST LUMPECTOMY Left 04/23/2020   surgery with NL and SN    BREAST LUMPECTOMY WITH NEEDLE LOCALIZATION Right 04/23/2020   Procedure: BREAST LUMPECTOMY WITH NEEDLE LOCALIZATION;  Surgeon: Robert Bellow, MD;  Location: [facility] ORS;  Service: General;  Laterality: Right;   BREAST LUMPECTOMY WITH SENTINEL LYMPH NODE BIOPSY Right 04/23/2020   Procedure: BREAST LUMPECTOMY WITH SENTINEL LYMPH NODE BX;  Surgeon: Robert Bellow, MD;  Location: [facility] ORS;  Service: General;  Laterality: Right;   CHOLECYSTECTOMY  2006   COLONOSCOPY     PORTACATH PLACEMENT Left 10/06/2019   Procedure: INSERTION PORT-A-CATH;  Surgeon: Robert Bellow, MD;  Location: [facility] ORS;  Service: General;  Laterality: Left;   SUBMUCOSAL TATTOO INJECTION  Right 10/06/2019   Procedure: Right axillary TATTOO INJECTION;  Surgeon: Robert Bellow, MD;  Location: [facility] ORS;  Service: General;  Laterality: Right;   VAGINAL DELIVERY     x3    HEMATOLOGY/ONCOLOGY HISTORY:  Oncology History Overview Note  #June 2021- Right breast cancer-T2N1; 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 Byrnett.] [id number] Carbo-taxol-AC;  JAN  2022-s/p Lumpectomy & ALND- [ypT1a (71m ) ypN1 (2/11 LN-positive)]; s/p radiation end of April 2022.  #Early June 2022-Xeloda 1000 mg per metered square [2000] 2 weeks on 1 week of 6-8 cycles; July 11th 2022- STARTING cycle #3-cut down the dose to 1500 mg BID[sec to HFS].   # JAN 2023- A. SKIN, RIGHT BREAST; PUNCH BIOPSY: [Dr.Byrnett] - INVASIVE CARCINOMA WITH DERMAL INFILTRATION AND LYMPHATIC INVASION. ER- weak POSISTIVE [10%]; PR/her2 NEG [[facility] 0]; JAN 20th, 2023- IMPRESSION: 1. Although the hypermetabolic right axillary node has resolved since the prior PET, there is a new hypermetabolic right low cervical/supraclavicular node, suspicious for nodal metastasis. 2. Decrease in right breast hypermetabolism with nonspecific overlying skin thickening in the setting of prior lumpectomy. 3. Low-level hypermetabolism within the T2 vertebral body, suspicious for isolated osseous metastasis. This could be confirmed with pre and postcontrast thoracic spine MRI. 4. Hypermetabolic thoracic nodes, slightly progressive. Given progression, nodal metastasis are slightly favored over reactive etiologies. 5. Incidental findings, including: Aortic Atherosclerosis (ICD10-I70.0). Tiny hiatal hernia. Hepatic steatosis with nonspecific caudate lobe enlargemen  Comment:  The carcinoma is morphologically similar to the post-neoadjuvant mammary  carcinoma seen in the January 2022 right breast wide excision and lymph  Nodes  # FEB 6th-Taxol single agent #1; FEB 13th, 2023- [CPS =15]; Taxol-Keytruda   DIAGNOSIS: Right breast cancer triple negative      Carcinoma of upper-inner quadrant of right breast in female, estrogen receptor negative ([place]  10/07/2019 Initial Diagnosis   Carcinoma of upper-inner quadrant of right breast in female, estrogen receptor negative ([place] [facility]   12/04/2019 Genetic Testing   Negative genetic testing. No pathogenic variants identified on the [facility] Common Hereditary Cancers Panel +  Skin Cancers Panel. The report date is 12/04/2019.  The Common Hereditary Cancers Panel + Skin Cancers Panel offered by [facility] includes sequencing and/or deletion duplication testing of the following 54 genes: APC*, ATM*, AXIN2, BAP1, BARD1, BMPR1A, BRCA1, BRCA2, BRIP1, CDH1, CDK4, CDKN2A (p14ARF), CDKN2A (p16INK4a), CHEK2, CTNNA1, DICER1*, EPCAM*, GREM1*, HOXB13, KIT, MEN1*, MITF*, MLH1*, MSH2*, MSH3*, MSH6*, MUTYH, NBN, NF1*, NTHL1, PALB2, PDGFRA, PMS2*, POLD1*, POLE, POT1, PTCH1, PTEN*, RAD50, RAD51C, RAD51D, RB1*, RNF43, SDHA*, SDHB, SDHC*, SDHD, SMAD4, SMARCA4, STK11, SUFU, TP53, TSC1*, TSC2, VHL.    05/07/2020 Cancer Staging   Staging form: Breast, AJCC 8th Edition - Clinical: Stage IIIB (cT2, cN1, cM0, G3, ER-, PR-, HER2-) - Signed by BCammie Sickle MD on 05/07/2020    04/18/2021 Cancer Staging   Staging form: Breast, AJCC 8th Edition - Pathologic: Stage IV (pT4d, pN3c, cM1, ER+, PR-, HER2-) - Signed by BCammie Sickle MD on 04/18/2021 Nuclear grade: G3      ALLERGIES:  has No Known Allergies.  MEDICATIONS:  Current Outpatient Medications  Medication Sig Dispense Refill   acetaminophen (TYLENOL) 500 MG tablet Take 500 mg by mouth every 8 (eight) hours as needed for moderate pain.     calcium carbonate (OS-CAL) 600 MG TABS tablet Take 600 mg by mouth daily.     Continuous Blood Gluc Sensor (FREESTYLE LIBRE 3 SENSOR) MISC Apply 1 each topically every 14 (fourteen) days. Place 1 sensor on the skin every 14 days. Use  to check glucose continuously 6 each 3   diphenhydrAMINE HCl, Sleep, (ZZZQUIL PO) Take 1 tablet by mouth at bedtime as needed.     gabapentin (NEURONTIN) 100 MG capsule Take 2 capsules (200 mg total) by mouth at bedtime. 180 capsule 0   glipiZIDE (GLUCOTROL XL) 10 MG 24 hr tablet Take 1 tablet (10 mg total) by mouth daily with breakfast. 90 tablet 1   levothyroxine (SYNTHROID) 100 MCG tablet Take 1 tablet (100 mcg total) by mouth daily before breakfast. 30  tablet 3   lidocaine-prilocaine (EMLA) cream Apply 1 application topically as needed. Apply to port and cover with saran wrap 1-2 hours prior to port access 30 g 1   losartan-hydrochlorothiazide (HYZAAR) 50-12.5 MG tablet Take 1 tablet by mouth once daily 90 tablet 0   lovastatin (MEVACOR) 40 MG tablet TAKE 1 TABLET BY MOUTH AT BEDTIME 90 tablet 0   metFORMIN (GLUCOPHAGE) 1000 MG tablet TAKE 2 TABLETS BY MOUTH ONCE DAILY WITH BREAKFAST 180 tablet 0   Multiple Vitamin (MULTIVITAMIN) tablet Take 1 tablet by mouth daily.     Multiple Vitamins-Minerals (MULTI-VITAMIN GUMMIES) CHEW Chew 1 tablet by mouth 1 day or 1 dose.     omeprazole (PRILOSEC) 20 MG capsule Take 1 capsule by mouth once daily 90 capsule 0   potassium chloride SA (KLOR-CON M) 20 MEQ tablet Take 1 tablet (20 mEq total) by mouth 2 (two) times daily. 60 tablet 2   Semaglutide,0.25 or 0.5MG/DOS, (OZEMPIC, 0.25 OR 0.5 MG/DOSE,) 2 MG/1.5ML SOPN Inject 0.5 mg into the skin once a week. 1.5 mL 11   No current facility-administered medications for this visit.    VITAL SIGNS: There were no vitals taken for this visit. There were no vitals filed for this visit.  Estimated body mass index is 29.66 kg/m as calculated from the following:   Height as of 08/23/21: 5' 7"  (1.702 m).   Weight as of 08/23/21: 189 lb 6.4 oz (85.9 kg).  LABS: CBC:    Component Value Date/Time   WBC 6.4 08/23/2021 1331   HGB 10.1 (L) 08/23/2021 1331   HCT 30.7 (L) 08/23/2021 1331   PLT 233 08/23/2021 1331   MCV 86.2 08/23/2021 1331   NEUTROABS 3.9 08/23/2021 1331   LYMPHSABS 1.6 08/23/2021 1331   MONOABS 0.7 08/23/2021 1331   EOSABS 0.1 08/23/2021 1331   BASOSABS 0.1 08/23/2021 1331   Comprehensive Metabolic Panel:    Component Value Date/Time   NA 138 08/23/2021 1331   K 3.2 (L) 08/23/2021 1331   CL 104 08/23/2021 1331   CO2 23 08/23/2021 1331   BUN 11 08/23/2021 1331   CREATININE 0.92 08/23/2021 1331   GLUCOSE 169 (H) 08/23/2021 1331   CALCIUM  9.0 08/23/2021 1331   AST 36 08/23/2021 1331   ALT 23 08/23/2021 1331   ALKPHOS 92 08/23/2021 1331   BILITOT 0.4 08/23/2021 1331   PROT 7.4 08/23/2021 1331   ALBUMIN 3.9 08/23/2021 1331    RADIOGRAPHIC STUDIES: CT SOFT TISSUE NECK W CONTRAST  Result Date: 07/26/2021 CLINICAL DATA:  Neck mass, nonpulsatile neck swelling/lymph nodes; breast cancer on chemo EXAM: CT NECK WITH CONTRAST TECHNIQUE: Multidetector CT imaging of the neck was performed using the standard protocol following the bolus administration of intravenous contrast. RADIATION DOSE REDUCTION: This exam was performed according to the departmental dose-optimization program which includes automated exposure control, adjustment of the mA and/or kV according to patient size and/or use of iterative reconstruction technique. CONTRAST:  1103m OMNIPAQUE IOHEXOL 300 MG/ML  SOLN COMPARISON:  PET-CT 04/15/2021, MRI thoracic spine 04/18/2021 FINDINGS: Pharynx and larynx: Unremarkable.  No mass or swelling. Salivary glands: Parotid and submandibular glands are unremarkable. Thyroid: Unremarkable. Lymph nodes: Level 5/supraclavicular mildly heterogeneous node with foci of cystic change or necrosis measuring 1.4 x 2.4 cm. This has increased in size since PET-CT of January. No additional enlarged or abnormal density nodes. Vascular: Major neck vessels are patent. Limited intracranial: No abnormal enhancement. Visualized orbits: Unremarkable. Mastoids and visualized paranasal sinuses: No significant opacification. Skeleton: T2 metastasis with pathologic fracture. The height loss is new from the thoracic spine MRI January. Upper chest: Dictated separately. Other: Skin marker overlies the posterolateral right neck at the level of the chin. This is in proximity to mass described above. IMPRESSION: Right neck mass reflects a level 5/supraclavicular lymph node has increased in size since PET CT of January 2023. This was hypermetabolic on that study. Known T2  metastasis with interval pathologic compression fracture. Electronically Signed   By: Macy Mis M.D.   On: 07/26/2021 17:05   CT CHEST ABDOMEN PELVIS W CONTRAST  Result Date: 07/27/2021 CLINICAL DATA:  Stage IV breast cancer. Evaluate treatment response. * Tracking Code: BO * EXAM: CT CHEST, ABDOMEN, AND PELVIS WITH CONTRAST TECHNIQUE: Multidetector CT imaging of the chest, abdomen and pelvis was performed following the standard protocol during bolus administration of intravenous contrast. RADIATION DOSE REDUCTION: This exam was performed according to the departmental dose-optimization program which includes automated exposure control, adjustment of the mA and/or kV according to patient size and/or use of iterative reconstruction technique. CONTRAST:  110m OMNIPAQUE IOHEXOL 300 MG/ML  SOLN COMPARISON:  04/15/2021 PET FINDINGS: CT CHEST FINDINGS Cardiovascular: Left Port-A-Cath tip high right atrium. Aortic atherosclerosis. Normal heart size, without pericardial effusion. No central pulmonary embolism, on this non-dedicated study. Mediastinum/Nodes: The right supraclavicular/posterior triangle node on the prior PET measures 1.8 cm on 04/04 today versus 1.4 cm on that exam (when remeasured). No axillary adenopathy. Prominent mediastinal nodes. Example AP window 11 mm node on 27/4, 12 mm on the prior CT. No hilar adenopathy. Tiny hiatal hernia. No internal mammary adenopathy. Lungs/Pleura: No pleural fluid. Biapical pleuroparenchymal scarring. Musculoskeletal: Right breast skin thickening. Areas of residual parenchymal hyperenhancement including laterally at 2.0 cm on 33/4 and deep to the areola at 1.3 cm on 28/4 are new or more well-defined than on the prior exam. New sclerosis at the T2 vertebral body, at the site of known metastasis on prior PET and thoracic spine MRI. CT ABDOMEN PELVIS FINDINGS Hepatobiliary: Caudate and lateral segment left liver lobe enlargement, suspicious for mild cirrhosis. No focal  liver lesion. Cholecystectomy, without biliary ductal dilatation. Pancreas: Normal, without mass or ductal dilatation. Spleen: Normal in size, without focal abnormality. Adrenals/Urinary Tract: Normal adrenal glands. Bilateral too small to characterize renal lesions. No hydronephrosis. Normal urinary bladder. Stomach/Bowel: The proximal stomach appears thick walled including on 62/4. Normal colon and terminal ileum. Appendectomy. Normal small bowel. Vascular/Lymphatic: Aortic atherosclerosis. No abdominopelvic adenopathy. Reproductive: Normal uterus and adnexa. Other: No significant free fluid. No evidence of omental or peritoneal disease. 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 IMPRESSION: CT CHEST IMPRESSION 1. Since the PET of 04/15/2021, enlargement of the previously described hypermetabolic right low cervical/supraclavicular nodal metastasis. 2. Foci of hyperenhancement within the right breast are new or more well-defined since 02/02/2021, suspicious for progressive residual/recurrent disease. 3. Nonspecific mediastinal nodes, similar to slightly decreased in size. 4. New sclerosis at the site of a known T2 vertebral body  metastasis. 5. Small hiatal hernia. CT ABDOMEN AND PELVIS IMPRESSION 1. No acute process or evidence of metastatic disease in the abdomen or pelvis. 2. Hepatic morphology which favors mild cirrhosis. Correlate with risk factors. 3. Mild proximal gastric wall thickening, suspicious for gastritis. 4.  Aortic Atherosclerosis (ICD10-I70.0). Electronically Signed   By: Abigail Miyamoto M.D.   On: 07/27/2021 09:50    PERFORMANCE STATUS (ECOG) : 1 - Symptomatic but completely ambulatory  Review of Systems Unless otherwise noted, a complete review of systems is negative.  Physical Exam General: NAD Cardiovascular: regular rate and rhythm Pulmonary: clear ant fields Abdomen: soft, nontender, + bowel sounds GU: no suprapubic tenderness Extremities: no  edema, no joint deformities Skin: no rashes Neurological: Weakness but otherwise nonfocal  Assessment and Plan- Patient is a 71 y.o. female with multiple medical problems including triple negative right breast cancer, initially diagnosed in July 2021 at stage IIIb with recurrence and progression of stage IV in January 2020 with bony metastatic disease.  Patient is on treatment with [place] Taxol and [place].  She is also receiving XRT.   Diarrhea - Labs consistent with mild hypokalemia and hypomagnesemia.  Will replete both in addition to IV fluids.  Send off for stool studies and check C. difficile PCR.  If negative, will treat symptomatically with antidiarrheals.  Discussed importance of increasing IV fluids.  Continue oral KCl.  ER precautions reviewed.  RTC early next week for repeat labs/fluids  Addendum: stool culture + EAEC. Will start on Cipro 560m BID x 7 days.    Patient expressed understanding and was in agreement with this plan. She also understands that She can call clinic at any time with any questions, concerns, or complaints.   Thank you for allowing me to participate in the care of this very pleasant patient.   Time Total: 20 minutes  Visit consisted of counseling and education dealing with the complex and emotionally intense issues of symptom management in the setting of serious illness.Greater than 50%  of this time was spent counseling and coordinating care related to the above assessment and plan.  Signed by: JAltha Harm PhD, NP-C

## 2021-08-25 NOTE — Progress Notes (Signed)
Pt added to [facility] today with complaint of diarrhea x1 week. She reports that it has been intermittent, but today, the diarrhea started at 4am and has not stopped since. States that her stomach feels "crampy" and "gassy".

## 2021-08-26 ENCOUNTER — Other Ambulatory Visit: Payer: Self-pay

## 2021-08-26 ENCOUNTER — Ambulatory Visit
Admission: RE | Admit: 2021-08-26 | Discharge: 2021-08-26 | Disposition: A | Discharge status: Home / Self Care | Payer: PPO | Source: Ambulatory Visit | Attending: Radiation Oncology | Admitting: Radiation Oncology

## 2021-08-26 DIAGNOSIS — C7951 Secondary malignant neoplasm of bone: Secondary | ICD-10-CM | POA: Diagnosis not present

## 2021-08-26 DIAGNOSIS — Z171 Estrogen receptor negative status [ER-]: Secondary | ICD-10-CM | POA: Diagnosis not present

## 2021-08-26 DIAGNOSIS — Z510 Encounter for antineoplastic radiation therapy: Secondary | ICD-10-CM | POA: Diagnosis not present

## 2021-08-26 DIAGNOSIS — C50211 Malignant neoplasm of upper-inner quadrant of right female breast: Secondary | ICD-10-CM | POA: Diagnosis not present

## 2021-08-26 LAB — RAD ONC ARIA SESSION SUMMARY
Course Elapsed Days: 2
Plan Fractions Treated to Date: 3
Plan Prescribed Dose Per Fraction: 3 Gy
Plan Total Fractions Prescribed: 10
Plan Total Prescribed Dose: 30 Gy
Reference Point Dosage Given to Date: 9 Gy
Reference Point Session Dosage Given: 3 Gy
Session Number: 3

## 2021-08-27 ENCOUNTER — Other Ambulatory Visit: Payer: Self-pay | Admitting: Internal Medicine

## 2021-08-29 ENCOUNTER — Other Ambulatory Visit: Payer: PPO

## 2021-08-29 ENCOUNTER — Other Ambulatory Visit: Payer: Self-pay

## 2021-08-29 ENCOUNTER — Ambulatory Visit: Payer: PPO

## 2021-08-29 ENCOUNTER — Ambulatory Visit
Admission: RE | Admit: 2021-08-29 | Discharge: 2021-08-29 | Disposition: A | Discharge status: Home / Self Care | Payer: PPO | Source: Ambulatory Visit | Attending: Radiation Oncology | Admitting: Radiation Oncology

## 2021-08-29 DIAGNOSIS — C50211 Malignant neoplasm of upper-inner quadrant of right female breast: Secondary | ICD-10-CM | POA: Diagnosis not present

## 2021-08-29 DIAGNOSIS — Z171 Estrogen receptor negative status [ER-]: Secondary | ICD-10-CM | POA: Diagnosis not present

## 2021-08-29 DIAGNOSIS — Z510 Encounter for antineoplastic radiation therapy: Secondary | ICD-10-CM | POA: Diagnosis not present

## 2021-08-29 DIAGNOSIS — C7951 Secondary malignant neoplasm of bone: Secondary | ICD-10-CM | POA: Diagnosis not present

## 2021-08-29 LAB — RAD ONC ARIA SESSION SUMMARY
Course Elapsed Days: 5
Plan Fractions Treated to Date: 4
Plan Prescribed Dose Per Fraction: 3 Gy
Plan Total Fractions Prescribed: 10
Plan Total Prescribed Dose: 30 Gy
Reference Point Dosage Given to Date: 12 Gy
Reference Point Session Dosage Given: 3 Gy
Session Number: 4

## 2021-08-30 ENCOUNTER — Inpatient Hospital Stay (HOSPITAL_BASED_OUTPATIENT_CLINIC_OR_DEPARTMENT_OTHER): Payer: PPO | Admitting: Hospice and Palliative Medicine

## 2021-08-30 ENCOUNTER — Other Ambulatory Visit: Payer: Self-pay

## 2021-08-30 ENCOUNTER — Ambulatory Visit
Admission: RE | Admit: 2021-08-30 | Discharge: 2021-08-30 | Disposition: A | Discharge status: Home / Self Care | Payer: PPO | Source: Ambulatory Visit | Attending: Radiation Oncology | Admitting: Radiation Oncology

## 2021-08-30 ENCOUNTER — Inpatient Hospital Stay: Payer: PPO

## 2021-08-30 VITALS — BP 115/70 | HR 83 | Temp 98.4°F | Resp 16 | Wt 190.0 lb

## 2021-08-30 DIAGNOSIS — Z171 Estrogen receptor negative status [ER-]: Secondary | ICD-10-CM

## 2021-08-30 DIAGNOSIS — C50211 Malignant neoplasm of upper-inner quadrant of right female breast: Secondary | ICD-10-CM | POA: Diagnosis not present

## 2021-08-30 DIAGNOSIS — Z510 Encounter for antineoplastic radiation therapy: Secondary | ICD-10-CM | POA: Diagnosis not present

## 2021-08-30 DIAGNOSIS — R197 Diarrhea, unspecified: Secondary | ICD-10-CM

## 2021-08-30 DIAGNOSIS — C7951 Secondary malignant neoplasm of bone: Secondary | ICD-10-CM | POA: Diagnosis not present

## 2021-08-30 LAB — CBC WITH DIFFERENTIAL/PLATELET
Abs Immature Granulocytes: 0.13 10*3/uL — ABNORMAL HIGH (ref 0.00–0.07)
Basophils Absolute: 0.1 10*3/uL (ref 0.0–0.1)
Basophils Relative: 2 %
Eosinophils Absolute: 0.1 10*3/uL (ref 0.0–0.5)
Eosinophils Relative: 2 %
HCT: 29 % — ABNORMAL LOW (ref 36.0–46.0)
Hemoglobin: 9.3 g/dL — ABNORMAL LOW (ref 12.0–15.0)
Immature Granulocytes: 4 %
Lymphocytes Relative: 27 %
Lymphs Abs: 0.9 10*3/uL (ref 0.7–4.0)
MCH: 28.5 pg (ref 26.0–34.0)
MCHC: 32.1 g/dL (ref 30.0–36.0)
MCV: 89 fL (ref 80.0–100.0)
Monocytes Absolute: 0.3 10*3/uL (ref 0.1–1.0)
Monocytes Relative: 8 %
Neutro Abs: 1.9 10*3/uL (ref 1.7–7.7)
Neutrophils Relative %: 57 %
Platelets: 173 10*3/uL (ref 150–400)
RBC: 3.26 MIL/uL — ABNORMAL LOW (ref 3.87–5.11)
RDW: 20.5 % — ABNORMAL HIGH (ref 11.5–15.5)
WBC: 3.3 10*3/uL — ABNORMAL LOW (ref 4.0–10.5)
nRBC: 0 % (ref 0.0–0.2)

## 2021-08-30 LAB — COMPREHENSIVE METABOLIC PANEL
ALT: 21 U/L (ref 0–44)
AST: 38 U/L (ref 15–41)
Albumin: 4 g/dL (ref 3.5–5.0)
Alkaline Phosphatase: 91 U/L (ref 38–126)
Anion gap: 9 (ref 5–15)
BUN: 7 mg/dL — ABNORMAL LOW (ref 8–23)
CO2: 24 mmol/L (ref 22–32)
Calcium: 8.6 mg/dL — ABNORMAL LOW (ref 8.9–10.3)
Chloride: 106 mmol/L (ref 98–111)
Creatinine, Ser: 0.77 mg/dL (ref 0.44–1.00)
GFR, Estimated: >60 mL/min (ref >60)
Glucose, Bld: 144 mg/dL — ABNORMAL HIGH (ref 70–99)
Potassium: 3.9 mmol/L (ref 3.5–5.1)
Sodium: 139 mmol/L (ref 135–145)
Total Bilirubin: 0.6 mg/dL (ref 0.3–1.2)
Total Protein: 7.4 g/dL (ref 6.5–8.1)

## 2021-08-30 LAB — RAD ONC ARIA SESSION SUMMARY
Course Elapsed Days: 6
Plan Fractions Treated to Date: 5
Plan Prescribed Dose Per Fraction: 3 Gy
Plan Total Fractions Prescribed: 10
Plan Total Prescribed Dose: 30 Gy
Reference Point Dosage Given to Date: 15 Gy
Reference Point Session Dosage Given: 3 Gy
Session Number: 5

## 2021-08-30 MED ORDER — SODIUM CHLORIDE 0.9 % IV SOLN
Freq: Once | INTRAVENOUS | Status: AC
  Filled 2021-08-30: qty 250

## 2021-08-30 MED ORDER — HEPARIN SOD (PORK) LOCK FLUSH 100 UNIT/ML IV SOLN
500.0000 [IU] | Freq: Once | INTRAVENOUS | Status: AC | PRN
  Administered 2021-08-30: 500 [IU]
  Filled 2021-08-30: qty 5

## 2021-08-30 MED ORDER — SODIUM CHLORIDE 0.9% FLUSH
10.0000 mL | Freq: Once | INTRAVENOUS | Status: AC | PRN
  Administered 2021-08-30: 10 mL
  Filled 2021-08-30: qty 10

## 2021-08-30 NOTE — Progress Notes (Signed)
Symptom Management Mobile at [facility] Telephone:([phone]) [phone] [phone]) [phone]  Patient Care Team: [name], [name] Noreene Filbert, MD as Referring Physician (Radiation Oncology) Cammie Sickle, MD as Consulting Physician (Internal Medicine) Bary Castilla [place] Gleason, MD as Consulting Physician (General Surgery) Jeral Fruit, RN as Registered Nurse [name], RN as [place], [place], [place] as Social Worker   Name of the patient: Ashlee Cox  [id number]  [birth year]   Date of visit: 08/30/21  Reason for Consult: Ashlee Cox is a 71 y.o. female with multiple medical problems including triple negative right breast cancer, initially diagnosed in July 2021 at stage IIIb with recurrence and progression of stage IV in January 2020 with bony metastatic disease.  Patient is on treatment with [place] Taxol and [place].  She is also receiving XRT.  Patient last saw Dr. Rogue Bussing on 08/23/2021, at which time she was having intermittent diarrhea but improved on Imodium.  There was question of etiology with possible viral gastroenteritis versus Keytruda.  However, symptoms seem to resolve without any steroid intervention.  Patient received Taxol on 5/30.  Patient was seen in [facility] on 08/25/2021 for recurrent diarrhea.  Stool cultures positive for EAEC and patient was started on 7-day course of Cipro.  She returns to [facility] today for follow-up.  Today, patient reports she is feeling much improved.  Diarrhea has essentially resolved but patient is still having frequent flatus.  No abdominal pain or swelling.  No nausea or vomiting.  Appetite has improved.  Denies any neurologic complaints. Denies recent fevers or illnesses. Denies any easy bleeding or bruising. Reports good appetite and denies weight loss. Denies chest pain. Denies urinary complaints. Patient offers no  further specific complaints today.  PAST MEDICAL HISTORY: Past Medical History:  Diagnosis Date   Cancer [facility]) 2021   right breast   Cyst, breast    benign   Diabetes mellitus without complication ([place])    Family history of melanoma    Family history of ovarian cancer    GERD (gastroesophageal reflux disease)    H/O: rheumatic fever    History of colonoscopy 2010   done bc of bleeding,  normal , due back in 5 yrs ([place])   Hyperlipidemia    Hypertension    Menopause    at age 49   Personal history of chemotherapy 09/2019   RIGHT  INVASIVE MAMMARY CARCINOMA   Personal history of radiation therapy     PAST SURGICAL HISTORY:  Past Surgical History:  Procedure Laterality Date   APPENDECTOMY  2006   BREAST BIOPSY Right 09/22/2019   INVASIVE MAMMARY CARCINOMA   BREAST CYST ASPIRATION Left 1999   BREAST LUMPECTOMY Left 04/23/2020   surgery with NL and SN    BREAST LUMPECTOMY WITH NEEDLE LOCALIZATION Right 04/23/2020   Procedure: BREAST LUMPECTOMY WITH NEEDLE LOCALIZATION;  Surgeon: Robert Bellow, MD;  Location: [facility] ORS;  Service: General;  Laterality: Right;   BREAST LUMPECTOMY WITH SENTINEL LYMPH NODE BIOPSY Right 04/23/2020   Procedure: BREAST LUMPECTOMY WITH SENTINEL LYMPH NODE BX;  Surgeon: Robert Bellow, MD;  Location: [place] ORS;  Service: General;  Laterality: Right;   CHOLECYSTECTOMY  2006   COLONOSCOPY     PORTACATH PLACEMENT Left 10/06/2019   Procedure: INSERTION PORT-A-CATH;  Surgeon: Robert Bellow, MD;  Location: [facility] ORS;  Service: General;  Laterality: Left;   SUBMUCOSAL TATTOO INJECTION Right 10/06/2019  Procedure: Right axillary TATTOO INJECTION;  Surgeon: Robert Bellow, MD;  Location: [facility] ORS;  Service: General;  Laterality: Right;   VAGINAL DELIVERY     x3    HEMATOLOGY/ONCOLOGY HISTORY:  Oncology History Overview Note  #June 2021- Right breast cancer-T2N1; 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 Byrnett.] [id number] Carbo-taxol-AC;  JAN 2022-s/p  Lumpectomy & ALND- [ypT1a (31m ) ypN1 (2/11 LN-positive)]; s/p radiation end of April 2022.  #Early June 2022-Xeloda 1000 mg per metered square [2000] 2 weeks on 1 week of 6-8 cycles; July 11th 2022- STARTING cycle #3-cut down the dose to 1500 mg BID[sec to HFS].   # JAN 2023- A. SKIN, RIGHT BREAST; PUNCH BIOPSY: [Dr.Byrnett] - INVASIVE CARCINOMA WITH DERMAL INFILTRATION AND LYMPHATIC INVASION. ER- weak POSISTIVE [10%]; PR/her2 NEG [[facility] 0]; JAN 20th, 2023- IMPRESSION: 1. Although the hypermetabolic right axillary node has resolved since the prior PET, there is a new hypermetabolic right low cervical/supraclavicular node, suspicious for nodal metastasis. 2. Decrease in right breast hypermetabolism with nonspecific overlying skin thickening in the setting of prior lumpectomy. 3. Low-level hypermetabolism within the T2 vertebral body, suspicious for isolated osseous metastasis. This could be confirmed with pre and postcontrast thoracic spine MRI. 4. Hypermetabolic thoracic nodes, slightly progressive. Given progression, nodal metastasis are slightly favored over reactive etiologies. 5. Incidental findings, including: Aortic Atherosclerosis (ICD10-I70.0). Tiny hiatal hernia. Hepatic steatosis with nonspecific caudate lobe enlargemen  Comment:  The carcinoma is morphologically similar to the post-neoadjuvant mammary  carcinoma seen in the January 2022 right breast wide excision and lymph  Nodes  # FEB 6th-Taxol single agent #1; FEB 13th, 2023- [CPS =15]; Taxol-Keytruda   DIAGNOSIS: Right breast cancer triple negative      Carcinoma of upper-inner quadrant of right breast in female, estrogen receptor negative ([place]  10/07/2019 Initial Diagnosis   Carcinoma of upper-inner quadrant of right breast in female, estrogen receptor negative ([place]   12/04/2019 Genetic Testing   Negative genetic testing. No pathogenic variants identified on the [facility] Common Hereditary Cancers Panel + Skin  Cancers Panel. The report date is 12/04/2019.  The Common Hereditary Cancers Panel + Skin Cancers Panel offered by [facility] includes sequencing and/or deletion duplication testing of the following 54 genes: APC*, ATM*, AXIN2, BAP1, BARD1, BMPR1A, BRCA1, BRCA2, BRIP1, CDH1, CDK4, CDKN2A (p14ARF), CDKN2A (p16INK4a), CHEK2, CTNNA1, DICER1*, EPCAM*, GREM1*, HOXB13, KIT, MEN1*, MITF*, MLH1*, MSH2*, MSH3*, MSH6*, MUTYH, NBN, NF1*, NTHL1, PALB2, PDGFRA, PMS2*, POLD1*, POLE, POT1, PTCH1, PTEN*, RAD50, RAD51C, RAD51D, RB1*, RNF43, SDHA*, SDHB, SDHC*, SDHD, SMAD4, SMARCA4, STK11, SUFU, TP53, TSC1*, TSC2, VHL.    05/07/2020 Cancer Staging   Staging form: Breast, AJCC 8th Edition - Clinical: Stage IIIB (cT2, cN1, cM0, G3, ER-, PR-, HER2-) - Signed by BCammie Sickle MD on 05/07/2020   04/18/2021 Cancer Staging   Staging form: Breast, AJCC 8th Edition - Pathologic: Stage IV (pT4d, pN3c, cM1, ER+, PR-, HER2-) - Signed by BCammie Sickle MD on 04/18/2021 Nuclear grade: G3     ALLERGIES:  has No Known Allergies.  MEDICATIONS:  Current Outpatient Medications  Medication Sig Dispense Refill   acetaminophen (TYLENOL) 500 MG tablet Take 500 mg by mouth every 8 (eight) hours as needed for moderate pain.     calcium carbonate (OS-CAL) 600 MG TABS tablet Take 600 mg by mouth daily.     ciprofloxacin (CIPRO) 500 MG tablet Take 1 tablet (500 mg total) by mouth 2 (two) times daily. 14 tablet 0   Continuous Blood Gluc Sensor (FREESTYLE LIBRE 3 SENSOR) MISC Apply 1  each topically every 14 (fourteen) days. Place 1 sensor on the skin every 14 days. Use to check glucose continuously 6 each 3   diphenhydrAMINE HCl, Sleep, (ZZZQUIL PO) Take 1 tablet by mouth at bedtime as needed.     gabapentin (NEURONTIN) 100 MG capsule Take 2 capsules (200 mg total) by mouth at bedtime. 180 capsule 0   glipiZIDE (GLUCOTROL XL) 10 MG 24 hr tablet Take 1 tablet (10 mg total) by mouth daily with breakfast. 90 tablet 1    levothyroxine (SYNTHROID) 100 MCG tablet Take 1 tablet (100 mcg total) by mouth daily before breakfast. 30 tablet 3   lidocaine-prilocaine (EMLA) cream Apply 1 application topically as needed. Apply to port and cover with saran wrap 1-2 hours prior to port access 30 g 1   losartan-hydrochlorothiazide (HYZAAR) 50-12.5 MG tablet Take 1 tablet by mouth once daily 90 tablet 0   lovastatin (MEVACOR) 40 MG tablet TAKE 1 TABLET BY MOUTH AT BEDTIME 90 tablet 1   metFORMIN (GLUCOPHAGE) 1000 MG tablet TAKE 2 TABLETS BY MOUTH ONCE DAILY WITH BREAKFAST 180 tablet 0   Multiple Vitamin (MULTIVITAMIN) tablet Take 1 tablet by mouth daily.     Multiple Vitamins-Minerals (MULTI-VITAMIN GUMMIES) CHEW Chew 1 tablet by mouth 1 day or 1 dose.     omeprazole (PRILOSEC) 20 MG capsule Take 1 capsule by mouth once daily 90 capsule 0   potassium chloride SA (KLOR-CON M) 20 MEQ tablet Take 1 tablet (20 mEq total) by mouth 2 (two) times daily. 60 tablet 2   Semaglutide,0.25 or 0.5MG/DOS, (OZEMPIC, 0.25 OR 0.5 MG/DOSE,) 2 MG/1.5ML SOPN Inject 0.5 mg into the skin once a week. 1.5 mL 11   No current facility-administered medications for this visit.    VITAL SIGNS: There were no vitals taken for this visit. There were no vitals filed for this visit.  Estimated body mass index is 29.44 kg/m as calculated from the following:   Height as of 08/23/21: 5' 7" (1.702 m).   Weight as of 08/25/21: 188 lb (85.3 kg).  LABS: CBC:    Component Value Date/Time   WBC 4.7 08/25/2021 1301   HGB 9.7 (L) 08/25/2021 1301   HCT 29.6 (L) 08/25/2021 1301   PLT 187 08/25/2021 1301   MCV 87.1 08/25/2021 1301   NEUTROABS 3.3 08/25/2021 1301   LYMPHSABS 1.0 08/25/2021 1301   MONOABS 0.2 08/25/2021 1301   EOSABS 0.0 08/25/2021 1301   BASOSABS 0.1 08/25/2021 1301   Comprehensive Metabolic Panel:    Component Value Date/Time   NA 137 08/25/2021 1301   K 3.3 (L) 08/25/2021 1301   CL 104 08/25/2021 1301   CO2 23 08/25/2021 1301   BUN 12  08/25/2021 1301   CREATININE 0.79 08/25/2021 1301   GLUCOSE 164 (H) 08/25/2021 1301   CALCIUM 9.0 08/25/2021 1301   AST 38 08/25/2021 1301   ALT 22 08/25/2021 1301   ALKPHOS 94 08/25/2021 1301   BILITOT 0.6 08/25/2021 1301   PROT 7.5 08/25/2021 1301   ALBUMIN 3.9 08/25/2021 1301    RADIOGRAPHIC STUDIES: No results found.  PERFORMANCE STATUS (ECOG) : 1 - Symptomatic but completely ambulatory  Review of Systems Unless otherwise noted, a complete review of systems is negative.  Physical Exam General: NAD Cardiovascular: regular rate and rhythm Pulmonary: clear ant fields Abdomen: soft, nontender, + bowel sounds GU: no suprapubic tenderness Extremities: no edema, no joint deformities Skin: no rashes Neurological: Weakness but otherwise nonfocal  Assessment and Plan- Patient is a 71 y.o.  female with multiple medical problems including triple negative right breast cancer, initially diagnosed in July 2021 at stage IIIb with recurrence and progression of stage IV in January 2020 with bony metastatic disease.  Patient is on treatment with [place] Taxol and [place].  She is also receiving XRT.   Diarrhea -this appears to have resolved with antibiotics.  Recommended simethicone to manage flatus.  We did proceed with IV fluids today for supportive care.  Patient will RTC next week to see Dr. Rogue Bussing.   Patient expressed understanding and was in agreement with this plan. She also understands that She can call clinic at any time with any questions, concerns, or complaints.   Thank you for allowing me to participate in the care of this very pleasant patient.   Time Total: 15 minutes  Visit consisted of counseling and education dealing with the complex and emotionally intense issues of symptom management in the setting of serious illness.Greater than 50%  of this time was spent counseling and coordinating care related to the above assessment and plan.  Signed by: Altha Harm, PhD,  NP-C

## 2021-08-30 NOTE — Progress Notes (Signed)
Pt returns for follow-up. States that diarrhea is better, but still having it infrequently, along with a lot of gas. States that she has taken the antibiotic and has 3 more days left. Reports good appetite.

## 2021-08-31 ENCOUNTER — Other Ambulatory Visit: Payer: Self-pay

## 2021-08-31 ENCOUNTER — Ambulatory Visit
Admission: RE | Admit: 2021-08-31 | Discharge: 2021-08-31 | Disposition: A | Discharge status: Home / Self Care | Payer: PPO | Source: Ambulatory Visit | Attending: Radiation Oncology | Admitting: Radiation Oncology

## 2021-08-31 ENCOUNTER — Ambulatory Visit (INDEPENDENT_AMBULATORY_CARE_PROVIDER_SITE_OTHER): Payer: PPO | Admitting: *Deleted

## 2021-08-31 DIAGNOSIS — C7951 Secondary malignant neoplasm of bone: Secondary | ICD-10-CM | POA: Diagnosis not present

## 2021-08-31 DIAGNOSIS — I10 Essential (primary) hypertension: Secondary | ICD-10-CM

## 2021-08-31 DIAGNOSIS — E113559 Type 2 diabetes mellitus with stable proliferative diabetic retinopathy, unspecified eye: Secondary | ICD-10-CM

## 2021-08-31 DIAGNOSIS — Z510 Encounter for antineoplastic radiation therapy: Secondary | ICD-10-CM | POA: Diagnosis not present

## 2021-08-31 DIAGNOSIS — C50211 Malignant neoplasm of upper-inner quadrant of right female breast: Secondary | ICD-10-CM | POA: Diagnosis not present

## 2021-08-31 DIAGNOSIS — Z171 Estrogen receptor negative status [ER-]: Secondary | ICD-10-CM | POA: Diagnosis not present

## 2021-08-31 LAB — RAD ONC ARIA SESSION SUMMARY
Course Elapsed Days: 7
Plan Fractions Treated to Date: 6
Plan Prescribed Dose Per Fraction: 3 Gy
Plan Total Fractions Prescribed: 10
Plan Total Prescribed Dose: 30 Gy
Reference Point Dosage Given to Date: 18 Gy
Reference Point Session Dosage Given: 3 Gy
Session Number: 6

## 2021-08-31 NOTE — Patient Instructions (Addendum)
Visit Information  Thank you for taking time to visit with me today. Please don't hesitate to contact me if I can be of assistance to you before our next scheduled telephone appointment.  Following are the goals we discussed today:  Take medications as prescribed   Attend all scheduled provider appointments Call provider office for new concerns or questions  check blood sugar at prescribed times: twice daily and when you have symptoms of low or high blood sugar enter blood sugar readings and medication or insulin into daily log take the blood sugar log to all doctor visits set a realistic goal keep feet up while sitting wash and dry feet carefully every day  Our next appointment is by telephone on 7/26 at 1000  Please call the care guide team at [phone] if you need to cancel or reschedule your appointment.   If you are experiencing a Mental Health or [place] or need someone to talk to, please call the Suicide and Crisis Lifeline: 988 call the [place] National Suicide Prevention Lifeline: 8023036459 or TTY: (617)325-0455 TTY (571)414-0510) to talk to a trained counselor call 1-800-273-TALK (toll free, 24 hour hotline) call 911   Patient verbalizes understanding of instructions and care plan provided today and agrees to view in [place]. Active MyChart status and patient understanding of how to access instructions and care plan via MyChart confirmed with patient.     Hubert Azure RN, MSN RN Care Management Coordinator [place] [phone] [email or web address]

## 2021-08-31 NOTE — Chronic Care Management (AMB) (Signed)
Chronic Care Management   CCM RN Visit Note  08/31/2021 Name: Ashlee Cox MRN: [id number] DOB: 02/23/1951  Subjective: Ashlee Cox is a 71 y.o. year old female who is a primary care patient of [name], MD. The care management team was consulted for assistance with disease management and care coordination needs.    Engaged with patient by telephone for initial visit in response to provider referral for case management and/or care coordination services.   Consent to Services:  The patient was given information about Chronic Care Management services, agreed to services, and gave verbal consent prior to initiation of services.  Please see initial visit note for detailed documentation.   Patient agreed to services and verbal consent obtained.   Assessment: Review of patient past medical history, allergies, medications, health status, including review of consultants reports, laboratory and other test data, was performed as part of comprehensive evaluation and provision of chronic care management services.   SDOH (Social Determinants of Health) assessments and interventions performed:    CCM Care Plan  No Known Allergies  Outpatient Encounter Medications as of 08/31/2021  Medication Sig   acetaminophen (TYLENOL) 500 MG tablet Take 500 mg by mouth every 8 (eight) hours as needed for moderate pain.   calcium carbonate (OS-CAL) 600 MG TABS tablet Take 600 mg by mouth daily.   ciprofloxacin (CIPRO) 500 MG tablet Take 1 tablet (500 mg total) by mouth 2 (two) times daily.   Continuous Blood Gluc Sensor (FREESTYLE LIBRE 3 SENSOR) MISC Apply 1 each topically every 14 (fourteen) days. Place 1 sensor on the skin every 14 days. Use to check glucose continuously   diphenhydrAMINE HCl, Sleep, (ZZZQUIL PO) Take 1 tablet by mouth at bedtime as needed.   gabapentin (NEURONTIN) 100 MG capsule Take 2 capsules (200 mg total) by mouth at bedtime.   glipiZIDE (GLUCOTROL XL) 10 MG 24 hr tablet  Take 1 tablet (10 mg total) by mouth daily with breakfast.   levothyroxine (SYNTHROID) 100 MCG tablet Take 1 tablet (100 mcg total) by mouth daily before breakfast.   lidocaine-prilocaine (EMLA) cream Apply 1 application topically as needed. Apply to port and cover with saran wrap 1-2 hours prior to port access   losartan-hydrochlorothiazide (HYZAAR) 50-12.5 MG tablet Take 1 tablet by mouth once daily   lovastatin (MEVACOR) 40 MG tablet TAKE 1 TABLET BY MOUTH AT BEDTIME   metFORMIN (GLUCOPHAGE) 1000 MG tablet TAKE 2 TABLETS BY MOUTH ONCE DAILY WITH BREAKFAST   Multiple Vitamin (MULTIVITAMIN) tablet Take 1 tablet by mouth daily.   Multiple Vitamins-Minerals (MULTI-VITAMIN GUMMIES) CHEW Chew 1 tablet by mouth 1 day or 1 dose.   omeprazole (PRILOSEC) 20 MG capsule Take 1 capsule by mouth once daily   potassium chloride SA (KLOR-CON M) 20 MEQ tablet Take 1 tablet (20 mEq total) by mouth 2 (two) times daily.   Semaglutide,0.25 or 0.'5MG'$ /DOS, (OZEMPIC, 0.25 OR 0.5 MG/DOSE,) 2 MG/1.5ML SOPN Inject 0.5 mg into the skin once a week.   No facility-administered encounter medications on file as of 08/31/2021.    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 ([place]) 06/20/2021   Acquired trigger finger 05/11/2020   Drug-induced polyneuropathy ([place]) 05/07/2020   Type 2 diabetes mellitus with stable proliferative retinopathy, without long-term current use of insulin ([place]) 05/07/2020   Genetic testing 12/05/2019   Family history of ovarian cancer    Family history of melanoma    Carcinoma of  upper-inner quadrant of right breast in female, estrogen receptor negative ([place]) 10/07/2019   Neoplasm of uncertain behavior of upper outer quadrant of female breast, right 09/25/2019   Osteopenia after menopause 03/23/2018   Constipation 08/04/2017   Elevated LFTs 08/05/2016   Psoriasis of scalp 12/10/2015   Diabetic retinopathy ([place]) 12/08/2015   Encounter for  preventive health examination 07/26/2012   Abnormal EKG 01/12/2011   Hypertension 12/27/2010   Hyperlipidemia with target LDL less than 100 12/27/2010   H/O: rheumatic fever    History of colonoscopy     Conditions to be addressed/monitored:DMII and cancer  Care Plan : [name] (Adult)  Updates made by [name], RN since 08/31/2021 12:00 AM     Problem: KNOWLEDGE DEFICIT RELATED TO SELF CARE MANAGEMENT OF CHRONIC CONDITIONS   Priority: Medium     Goal: PATIENT WILL WORK WITH CCM TEAM TO INCREASEKNOWLEDGE OF CHRONIC CONDITIONS   Start Date: 05/04/2021  Expected End Date: 05/04/2021  Priority: Medium  Note:   Current Barriers:  Chronic Disease Management support and education needs related to DMII and BREAST CANCER ;  Patient with recurrent breast cancer post right lumpectomy.  Has porta cath and restarting chemotherapy this week.  In great spirits and has family support.  History of diabetes.  Fasting blood sugar was 247 with recent fasting ranges of 170-200.  Was told chemo would increase blood sugars due to steroids.  BP 132/89 with recent ranges of 100-130/50-80's  3/8--continues to feel ok; awaiting next chemo plan.  Does have elevated liver enzymes the doctors are evaluating.  Still in good spirits.  Blood sugars have been elevated.  Fasting blood sugar this morning was 160 with recent ranges of 111-160's, discussed chemo elevating blood sugars.  Discussed CCM Pharm D Cat[place] changing positions and Pharmacy tech [place] assisting with medication assistance application  0/10--XNATFTD she is doing well.  Tolerating chemotherapy, without major complications or side effects.  Fasting blood sugar this morning was 121 withh recent ranges of 120's.  Denies any chest pains, SOB, swelling or any major issues or concerns a this time.  Awaiting medication Ozempic to be mailed.  6/7--patient in good spirits.  Now doing daily radiation and tolerating well.  Did have episode of  diarrhea that has since resolved.  CBG th morning 90.  BP 123/70.  Denies any hypo/hyperglycemia or hypo/hypertension.  Denies pain or any issues or concerns  RNCM Clinical Goal(s):  Patient will verbalize understanding of plan for management of DMII and BREAST CANCER as evidenced by better glucose ranges and tolerating chemotherapy demonstrate improved health management independence as evidenced by decreasing Hgb A1C by 0.3 points and completing course of chemotherapy        through collaboration with RN Care manager, provider, and care team.   Interventions: 1:1 collaboration with primary care provider regarding development and update of comprehensive plan of care as evidenced by provider attestation and co-signature Inter-disciplinary care team collaboration (see longitudinal plan of care) Evaluation of current treatment plan related to  self management and patient's adherence to plan as established by provider   Diabetes:  (Status: Goal on Track (progressing): YES.) Long Term Goal   Lab Results  Component Value Date   HGBA1C 9.2 (A) 04/20/2021   @ Assessed patient's understanding of A1c goal: <7% Provided education to patient about basic DM disease process; Reviewed medications with patient and discussed importance of medication adherence;        Reviewed prescribed diet  with patient low carbohydrate; Counseled on importance of regular laboratory monitoring as prescribed;        Discussed plans with patient for ongoing care management follow up and provided patient with direct contact information for care management team;      Provided patient with written educational materials related to hypo and hyperglycemia and importance of correct treatment;       Advised patient, providing education and rationale, to check cbg twice daily and when you have symptoms of low or high blood sugar and record        Screening for signs and symptoms of depression related to chronic disease state;         Discussed healthier food and drink options Discussed what elevates blood sugars Confirmed patient has Ozempic injection  Oncology:  (Goal on Track (progressing): YES.) Long Term Goal  Assessment of understanding of oncology diagnosis:  Assessed patient understanding of cancer diagnosis and recommended treatment plan Reviewed upcoming provider appointments and treatment appointments Assessed available transportation to appointments and treatments. Has consistent/reliable transportation: Yes Assessed support system. Has consistent/reliable family or other support: Yes Nutrition assessment performed Empathy and emotional support provided to patient  Patient Goals/Self-Care Activities: Take medications as prescribed   Attend all scheduled provider appointments Call provider office for new concerns or questions  check blood sugar at prescribed times: twice daily and when you have symptoms of low or high blood sugar enter blood sugar readings and medication or insulin into daily log take the blood sugar log to all doctor visits set a realistic goal keep feet up while sitting wash and dry feet carefully every day Call provider or this rncm if you do not have Ozempic by this Friday        Plan:The care management team will reach out to the patient again over the next 60 days.  Hubert Azure RN, MSN RN Care Management Coordinator [place] [phone] [email or web address]

## 2021-09-01 ENCOUNTER — Other Ambulatory Visit: Payer: Self-pay

## 2021-09-01 ENCOUNTER — Ambulatory Visit
Admission: RE | Admit: 2021-09-01 | Discharge: 2021-09-01 | Disposition: A | Discharge status: Home / Self Care | Payer: PPO | Source: Ambulatory Visit | Attending: Radiation Oncology | Admitting: Radiation Oncology

## 2021-09-01 DIAGNOSIS — Z510 Encounter for antineoplastic radiation therapy: Secondary | ICD-10-CM | POA: Diagnosis not present

## 2021-09-01 DIAGNOSIS — Z171 Estrogen receptor negative status [ER-]: Secondary | ICD-10-CM | POA: Diagnosis not present

## 2021-09-01 DIAGNOSIS — C50211 Malignant neoplasm of upper-inner quadrant of right female breast: Secondary | ICD-10-CM | POA: Diagnosis not present

## 2021-09-01 DIAGNOSIS — C7951 Secondary malignant neoplasm of bone: Secondary | ICD-10-CM | POA: Diagnosis not present

## 2021-09-01 LAB — RAD ONC ARIA SESSION SUMMARY
Course Elapsed Days: 8
Plan Fractions Treated to Date: 7
Plan Prescribed Dose Per Fraction: 3 Gy
Plan Total Fractions Prescribed: 10
Plan Total Prescribed Dose: 30 Gy
Reference Point Dosage Given to Date: 21 Gy
Reference Point Session Dosage Given: 3 Gy
Session Number: 7

## 2021-09-02 ENCOUNTER — Other Ambulatory Visit: Payer: Self-pay

## 2021-09-02 ENCOUNTER — Ambulatory Visit
Admission: RE | Admit: 2021-09-02 | Discharge: 2021-09-02 | Disposition: A | Discharge status: Home / Self Care | Payer: PPO | Source: Ambulatory Visit | Attending: Radiation Oncology | Admitting: Radiation Oncology

## 2021-09-02 ENCOUNTER — Other Ambulatory Visit: Payer: PPO

## 2021-09-02 DIAGNOSIS — Z510 Encounter for antineoplastic radiation therapy: Secondary | ICD-10-CM | POA: Diagnosis not present

## 2021-09-02 DIAGNOSIS — C50211 Malignant neoplasm of upper-inner quadrant of right female breast: Secondary | ICD-10-CM | POA: Diagnosis not present

## 2021-09-02 DIAGNOSIS — Z171 Estrogen receptor negative status [ER-]: Secondary | ICD-10-CM | POA: Diagnosis not present

## 2021-09-02 DIAGNOSIS — C7951 Secondary malignant neoplasm of bone: Secondary | ICD-10-CM | POA: Diagnosis not present

## 2021-09-02 LAB — RAD ONC ARIA SESSION SUMMARY
Course Elapsed Days: 9
Plan Fractions Treated to Date: 8
Plan Prescribed Dose Per Fraction: 3 Gy
Plan Total Fractions Prescribed: 10
Plan Total Prescribed Dose: 30 Gy
Reference Point Dosage Given to Date: 24 Gy
Reference Point Session Dosage Given: 3 Gy
Session Number: 8

## 2021-09-05 ENCOUNTER — Inpatient Hospital Stay (HOSPITAL_BASED_OUTPATIENT_CLINIC_OR_DEPARTMENT_OTHER): Payer: PPO | Admitting: Internal Medicine

## 2021-09-05 ENCOUNTER — Other Ambulatory Visit: Payer: Self-pay

## 2021-09-05 ENCOUNTER — Ambulatory Visit
Admission: RE | Admit: 2021-09-05 | Discharge: 2021-09-05 | Disposition: A | Discharge status: Home / Self Care | Payer: PPO | Source: Ambulatory Visit | Attending: Radiation Oncology | Admitting: Radiation Oncology

## 2021-09-05 ENCOUNTER — Encounter: Payer: Self-pay | Admitting: Internal Medicine

## 2021-09-05 ENCOUNTER — Inpatient Hospital Stay: Payer: PPO

## 2021-09-05 DIAGNOSIS — C7951 Secondary malignant neoplasm of bone: Secondary | ICD-10-CM | POA: Diagnosis not present

## 2021-09-05 DIAGNOSIS — C50911 Malignant neoplasm of unspecified site of right female breast: Secondary | ICD-10-CM

## 2021-09-05 DIAGNOSIS — C50211 Malignant neoplasm of upper-inner quadrant of right female breast: Secondary | ICD-10-CM | POA: Diagnosis not present

## 2021-09-05 DIAGNOSIS — Z95828 Presence of other vascular implants and grafts: Secondary | ICD-10-CM

## 2021-09-05 DIAGNOSIS — Z171 Estrogen receptor negative status [ER-]: Secondary | ICD-10-CM | POA: Diagnosis not present

## 2021-09-05 DIAGNOSIS — Z510 Encounter for antineoplastic radiation therapy: Secondary | ICD-10-CM | POA: Diagnosis not present

## 2021-09-05 LAB — CBC WITH DIFFERENTIAL/PLATELET
Abs Immature Granulocytes: 0.02 10*3/uL (ref 0.00–0.07)
Basophils Absolute: 0 10*3/uL (ref 0.0–0.1)
Basophils Relative: 1 %
Eosinophils Absolute: 0.1 10*3/uL (ref 0.0–0.5)
Eosinophils Relative: 2 %
HCT: 30 % — ABNORMAL LOW (ref 36.0–46.0)
Hemoglobin: 9.3 g/dL — ABNORMAL LOW (ref 12.0–15.0)
Immature Granulocytes: 1 %
Lymphocytes Relative: 23 %
Lymphs Abs: 0.8 10*3/uL (ref 0.7–4.0)
MCH: 27.6 pg (ref 26.0–34.0)
MCHC: 31 g/dL (ref 30.0–36.0)
MCV: 89 fL (ref 80.0–100.0)
Monocytes Absolute: 0.4 10*3/uL (ref 0.1–1.0)
Monocytes Relative: 12 %
Neutro Abs: 2 10*3/uL (ref 1.7–7.7)
Neutrophils Relative %: 61 %
Platelets: 204 10*3/uL (ref 150–400)
RBC: 3.37 MIL/uL — ABNORMAL LOW (ref 3.87–5.11)
RDW: 20.6 % — ABNORMAL HIGH (ref 11.5–15.5)
WBC: 3.3 10*3/uL — ABNORMAL LOW (ref 4.0–10.5)
nRBC: 0 % (ref 0.0–0.2)

## 2021-09-05 LAB — RAD ONC ARIA SESSION SUMMARY
Course Elapsed Days: 12
Plan Fractions Treated to Date: 9
Plan Prescribed Dose Per Fraction: 3 Gy
Plan Total Fractions Prescribed: 10
Plan Total Prescribed Dose: 30 Gy
Reference Point Dosage Given to Date: 27 Gy
Reference Point Session Dosage Given: 3 Gy
Session Number: 9

## 2021-09-05 LAB — COMPREHENSIVE METABOLIC PANEL
ALT: 19 U/L (ref 0–44)
AST: 34 U/L (ref 15–41)
Albumin: 3.9 g/dL (ref 3.5–5.0)
Alkaline Phosphatase: 96 U/L (ref 38–126)
Anion gap: 9 (ref 5–15)
BUN: 12 mg/dL (ref 8–23)
CO2: 24 mmol/L (ref 22–32)
Calcium: 9.1 mg/dL (ref 8.9–10.3)
Chloride: 105 mmol/L (ref 98–111)
Creatinine, Ser: 0.9 mg/dL (ref 0.44–1.00)
GFR, Estimated: >60 mL/min (ref >60)
Glucose, Bld: 187 mg/dL — ABNORMAL HIGH (ref 70–99)
Potassium: 4 mmol/L (ref 3.5–5.1)
Sodium: 138 mmol/L (ref 135–145)
Total Bilirubin: 0.9 mg/dL (ref 0.3–1.2)
Total Protein: 7.7 g/dL (ref 6.5–8.1)

## 2021-09-05 MED ORDER — SODIUM CHLORIDE 0.9% FLUSH
10.0000 mL | INTRAVENOUS | Status: DC | PRN
  Administered 2021-09-05: 10 mL via INTRAVENOUS
  Filled 2021-09-05: qty 10

## 2021-09-05 MED ORDER — HEPARIN SOD (PORK) LOCK FLUSH 100 UNIT/ML IV SOLN
500.0000 [IU] | Freq: Once | INTRAVENOUS | Status: AC
  Administered 2021-09-05: 500 [IU] via INTRAVENOUS
  Filled 2021-09-05: qty 5

## 2021-09-05 NOTE — Progress Notes (Signed)
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 Byrnett.] [id number] Carbo-taxol-AC;  JAN 2022-s/p Lumpectomy & ALND- [ypT1a (37m ) ypN1 (2/11 LN-positive)]; s/p radiation end of April 2022.  #Early June 2022-Xeloda 1000 mg per metered square [2000] 2 weeks on 1 week of 6-8 cycles; July 11th 2022- STARTING cycle #3-cut down the dose to 1500 mg BID[sec to HFS].   # JAN 2023- A. SKIN, RIGHT BREAST; PUNCH BIOPSY: [Dr.Byrnett] - INVASIVE CARCINOMA WITH DERMAL INFILTRATION AND LYMPHATIC INVASION. ER- weak POSISTIVE [10%]; PR/her2 NEG [[facility] 0]; JAN 20th, 2023- IMPRESSION: 1. Although the hypermetabolic right axillary node has resolved since the prior PET, there is a new hypermetabolic right low cervical/supraclavicular node, suspicious for nodal metastasis. 2. Decrease in right breast hypermetabolism with nonspecific overlying skin thickening in the setting of prior lumpectomy. 3. Low-level hypermetabolism within the T2 vertebral body, suspicious for isolated osseous metastasis. This could be confirmed with pre and postcontrast thoracic spine MRI. 4. Hypermetabolic thoracic nodes, slightly progressive. Given progression, nodal metastasis are slightly favored over reactive etiologies. 5. Incidental findings, including: Aortic Atherosclerosis (ICD10-I70.0). Tiny hiatal hernia. 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 No pathogenic variants identified on the [facility] Common Hereditary Cancers Panel + Skin Cancers Panel. The report date is 12/04/2019.  The Common Hereditary Cancers Panel + Skin Cancers Panel offered by [facility] includes sequencing and/or deletion duplication testing of the following 54 genes: APC*, ATM*, AXIN2, BAP1, BARD1, BMPR1A, BRCA1, BRCA2, BRIP1, CDH1, CDK4, CDKN2A (p14ARF), CDKN2A (p16INK4a), CHEK2, CTNNA1, DICER1*, EPCAM*, GREM1*, HOXB13, KIT, MEN1*, MITF*, MLH1*, MSH2*, MSH3*, MSH6*, MUTYH, NBN, NF1*, NTHL1, PALB2, PDGFRA, PMS2*, POLD1*, POLE, POT1, PTCH1, PTEN*, RAD50, RAD51C, RAD51D, RB1*, RNF43, SDHA*, SDHB, SDHC*, SDHD, SMAD4, SMARCA4, STK11, SUFU, TP53, TSC1*, TSC2, VHL.    05/07/2020 Cancer Staging   Staging form: Breast, AJCC 8th Edition - Clinical: Stage IIIB (cT2, cN1, cM0, G3, ER-, PR-, HER2-) - Signed by BCammie Sickle MD on 05/07/2020   04/18/2021 Cancer Staging   Staging form: Breast, AJCC 8th Edition - Pathologic: Stage IV (pT4d, pN3c, cM1, ER+, PR-, HER2-) - Signed by BCammie Sickle MD on 04/18/2021 Nuclear grade: G3      HISTORY OF PRESENTING ILLNESS: Accompanied by husband.  Walking independently.  Ashlee DemelloBumgarner 71y.o.  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   In the interim  patient started on palliative radiation to the right supraclavicular region;  also to the T2 vertebral body.  Also MAY 2023- diagnosed with hypothyroidism-on Synthroid.  In the interim patient was evaluated in symptom management clinic-severe diarrhea.  Stool studies positive for E. coli.  S/p antibiotics-symptoms resolved.  Patient finished antibiotics 3 days ago.  Patient complains of mild tingling and numbness in extremities.   No falls.  Patient admits to resolution of the breast skin rash.  Complains of mild to moderate fatigue.   Review of Systems  Constitutional:  Positive for malaise/fatigue. Negative for chills, diaphoresis, fever and weight loss.  HENT:  Negative for nosebleeds and sore throat.   Eyes:  Negative for double vision.  Respiratory:  Negative for cough, hemoptysis, sputum production and wheezing.   Cardiovascular:  Negative for chest pain, palpitations, orthopnea and leg swelling.  Gastrointestinal:  Negative for abdominal pain, blood in stool, constipation and melena.  Genitourinary:  Negative for dysuria, frequency and urgency.  Musculoskeletal:  Positive for back pain and neck pain. Negative for joint pain.  Neurological:  Positive for tingling. Negative for dizziness, focal weakness, weakness and headaches.  Endo/Heme/Allergies:  Does not bruise/bleed easily.  Psychiatric/Behavioral:  Negative for depression. The patient is not nervous/anxious and does not have insomnia.      MEDICAL HISTORY:  Past Medical History:  Diagnosis Date   Cancer [facility]) 2021   right breast   Cyst, breast    benign   Diabetes mellitus without complication ([place])    Family history of melanoma    Family history of ovarian cancer    GERD (gastroesophageal reflux disease)    H/O: rheumatic fever    History of colonoscopy 2010   done bc of bleeding,  normal , due back in 5 yrs ([place])   Hyperlipidemia    Hypertension    Menopause    at age 22   Personal history of chemotherapy  09/2019   RIGHT  INVASIVE MAMMARY CARCINOMA   Personal history of radiation therapy     SURGICAL HISTORY: Past Surgical History:  Procedure Laterality Date   APPENDECTOMY  2006   BREAST BIOPSY Right 09/22/2019   INVASIVE MAMMARY CARCINOMA   BREAST CYST ASPIRATION Left 1999   BREAST LUMPECTOMY Left 04/23/2020   surgery with NL and SN    BREAST LUMPECTOMY WITH NEEDLE LOCALIZATION Right 04/23/2020   Procedure: BREAST LUMPECTOMY WITH NEEDLE LOCALIZATION;  Surgeon: Robert Bellow, MD;  Location: [facility] ORS;  Service: General;  Laterality: Right;   BREAST LUMPECTOMY WITH SENTINEL LYMPH NODE BIOPSY Right 04/23/2020   Procedure: BREAST LUMPECTOMY WITH SENTINEL LYMPH NODE BX;  Surgeon: Robert Bellow, MD;  Location: [facility] ORS;  Service: General;  Laterality: Right;   CHOLECYSTECTOMY  2006   COLONOSCOPY     PORTACATH PLACEMENT Left 10/06/2019   Procedure: INSERTION PORT-A-CATH;  Surgeon: Robert Bellow, MD;  Location: [facility] ORS;  Service: General;  Laterality: Left;   SUBMUCOSAL TATTOO INJECTION Right 10/06/2019   Procedure: Right axillary TATTOO INJECTION;  Surgeon: Robert Bellow, MD;  Location: [facility] ORS;  Service: General;  Laterality: Right;   VAGINAL DELIVERY     x3    SOCIAL HISTORY: Social History   Socioeconomic History   Marital status: Married    Spouse name: Chrissie Noa   Number of children: 3   Years of education: Not on file   Highest education level: Not on file  Occupational History   Occupation: Glass blower/designer: [employer]  Tobacco Use   Smoking status: Former    Years: 1.00  Types: Cigarettes    Quit date: 12/26/2005    Years since quitting: 15.7   Smokeless tobacco: Never   Tobacco comments:    smoked for less than 1 years,  1 cig/day  Vaping Use   Vaping Use: Never used  Substance and Sexual Activity   Alcohol use: No   Drug use: No   Sexual activity: Never    Partners: Female  Other Topics Concern   Not on file  Social History Narrative    Widowed, March 2014; remarried.      [employer] ret[employer]; quit smoking 1ppw; no alcohol.       Social Determinants of Health   Financial Resource Strain: Medium Risk (05/25/2021)   Overall Financial Resource Strain (CARDIA)    Difficulty of Paying Living Expenses: Somewhat hard  Food Insecurity: No Food Insecurity (05/18/2021)   Hunger Vital Sign    Worried About Running Out of Food in the Last Year: Never true    Ran Out of Food in the Last Year: Never true  Transportation Needs: No Transportation Needs (05/18/2021)   PRAPARE - Hydrologist (Medical): No    Lack of Transportation (Non-Medical): No  Physical Activity: Inactive (05/18/2021)   Exercise Vital Sign    Days of Exercise per Week: 0 days    Minutes of Exercise per Session: 0 min  Stress: No Stress Concern Present (05/18/2021)   [place]    Feeling of Stress : Not at all  Social Connections: [place] (05/18/2021)   Social Connection and Isolation Panel [NHANES]    Frequency of Communication with Friends and Family: More than three times a week    Frequency of Social Gatherings with Friends and Family: More than three times a week    Attends Religious Services: More than 4 times per year    Active Member of [employer] or Organizations: Yes    Attends Music therapist: More than 4 times per year    Marital Status: Married  Human resources officer Violence: Not At Risk (05/04/2021)   Humiliation, Afraid, Rape, and Kick questionnaire    Fear of Current or Ex-Partner: No    Emotionally Abused: No    Physically Abused: No    Sexually Abused: No    FAMILY HISTORY: Family History  Problem Relation Age of Onset   Cancer Mother 61       ovarian- died 4-5 years.    Heart disease Mother 65   Diabetes Mother    Stroke Father 10   Diabetes Father    Diabetes Sister    Melanoma Sister        survived   Breast cancer Neg Hx      ALLERGIES:  has No Known Allergies.  MEDICATIONS:  Current Outpatient Medications  Medication Sig Dispense Refill   acetaminophen (TYLENOL) 500 MG tablet Take 500 mg by mouth every 8 (eight) hours as needed for moderate pain.     calcium carbonate (OS-CAL) 600 MG TABS tablet Take 600 mg by mouth daily.     Continuous Blood Gluc Sensor (FREESTYLE LIBRE 3 SENSOR) MISC Apply 1 each topically every 14 (fourteen) days. Place 1 sensor on the skin every 14 days. Use to check glucose continuously 6 each 3   diphenhydrAMINE HCl, Sleep, (ZZZQUIL PO) Take 1 tablet by mouth at bedtime as needed.     gabapentin (NEURONTIN) 100 MG capsule Take 2 capsules (200 mg total) by mouth at  bedtime. 180 capsule 0   glipiZIDE (GLUCOTROL XL) 10 MG 24 hr tablet Take 1 tablet (10 mg total) by mouth daily with breakfast. 90 tablet 1   levothyroxine (SYNTHROID) 100 MCG tablet Take 1 tablet (100 mcg total) by mouth daily before breakfast. 30 tablet 3   lidocaine-prilocaine (EMLA) cream Apply 1 application topically as needed. Apply to port and cover with saran wrap 1-2 hours prior to port access 30 g 1   losartan-hydrochlorothiazide (HYZAAR) 50-12.5 MG tablet Take 1 tablet by mouth once daily 90 tablet 0   lovastatin (MEVACOR) 40 MG tablet TAKE 1 TABLET BY MOUTH AT BEDTIME 90 tablet 1   metFORMIN (GLUCOPHAGE) 1000 MG tablet TAKE 2 TABLETS BY MOUTH ONCE DAILY WITH BREAKFAST 180 tablet 0   Multiple Vitamin (MULTIVITAMIN) tablet Take 1 tablet by mouth daily.     Multiple Vitamins-Minerals (MULTI-VITAMIN GUMMIES) CHEW Chew 1 tablet by mouth 1 day or 1 dose.     omeprazole (PRILOSEC) 20 MG capsule Take 1 capsule by mouth once daily 90 capsule 0   potassium chloride SA (KLOR-CON M) 20 MEQ tablet Take 1 tablet (20 mEq total) by mouth 2 (two) times daily. 60 tablet 2   Semaglutide,0.25 or 0.5MG/DOS, (OZEMPIC, 0.25 OR 0.5 MG/DOSE,) 2 MG/1.5ML SOPN Inject 0.5 mg into the skin once a week. 1.5 mL 11   No current  facility-administered medications for this visit.   Facility-Administered Medications Ordered in Other Visits  Medication Dose Route Frequency Provider Last Rate Last Admin   sodium chloride flush (NS) 0.9 % injection 10 mL  10 mL Intravenous PRN Cammie Sickle, MD   10 mL at 09/05/21 1041      .  PHYSICAL EXAMINATION: ECOG PERFORMANCE STATUS: 0 - Asymptomatic  Vitals:   09/05/21 0941  BP: 131/69  Pulse: 86  Temp: 98.5 F (36.9 C)  SpO2: 100%    Filed Weights   09/05/21 0941  Weight: 189 lb 1.6 oz (85.8 kg)    Right supraclavicular lymphadenopathy felt. 2-3 cm in size.   Physical Exam HENT:     Head: Normocephalic and atraumatic.     Mouth/Throat:     Pharynx: No oropharyngeal exudate.  Eyes:     Pupils: Pupils are equal, round, and reactive to light.  Cardiovascular:     Rate and Rhythm: Normal rate and regular rhythm.  Pulmonary:     Effort: Pulmonary effort is normal. No respiratory distress.     Breath sounds: Normal breath sounds. No wheezing.  Abdominal:     General: Bowel sounds are normal. There is no distension.     Palpations: Abdomen is soft. There is no mass.     Tenderness: There is no abdominal tenderness. There is no guarding or rebound.  Musculoskeletal:        General: No tenderness. Normal range of motion.     Cervical back: Normal range of motion and neck supple.  Skin:    General: Skin is warm.  Neurological:     Mental Status: She is alert and oriented to person, place, and time.  Psychiatric:        Mood and Affect: Affect normal.      LABORATORY DATA:  I have reviewed the data as listed Lab Results  Component Value Date   WBC 3.3 (L) 09/05/2021   HGB 9.3 (L) 09/05/2021   HCT 30.0 (L) 09/05/2021   MCV 89.0 09/05/2021   PLT 204 09/05/2021   Recent Labs    08/25/21 1301  08/30/21 0955 09/05/21 0924  NA 137 139 138  K 3.3* 3.9 4.0  CL 104 106 105  CO2 _0 GLUCOSE 164* 144* 187*  BUN 12 7* 12  CREATININE 0.79  0.77 0.90  CALCIUM 9.0 8.6* 9.1  GFRNONAA >60 >60 >60  PROT 7.5 7.4 7.7  ALBUMIN 3.9 4.0 3.9  AST 38 38 34  ALT _1 ALKPHOS 94 91 96  BILITOT 0.6 0.6 0.9    RADIOGRAPHIC STUDIES: I have personally reviewed the radiological images as listed and agreed with the findings in the report. No results found.  ASSESSMENT & PLAN:   Carcinoma of upper-inner quadrant of right breast in female, estrogen receptor negative ([place]) #Recurrent right breast cancer-s/p breast skin biopsy [FEB 2023- ER positive /PR HER2-0; however 2017-HER2 NEU-1+]; - recurrent- stage IV; NGS -FEB 2023- CPS =15%. Currently on Keytruda- taxol [2/13]; CT CHEST:  Since the PET of 04/15/2021, enlargement of the previously described hypermetabolic right low cervical/supraclavicular nodal metastasis. Foci of hyperenhancement within the right breast are new or more well-defined since 02/02/2021, suspicious for progressive residual/recurrent disease. Nonspecific mediastinal nodes, similar to slightly decreased in size;  New sclerosis at the site of a known T2 vertebral body metastasis.  # HOLD chemo today-given patient's recent E. coli similar diarrhea.  Patient is currently on palliative radiation therapy to her right supraclavicular nodal metastasis as well as T2 vertebral body.  Planning 30 Gray in 10 fractions [5/31 to 6/12] Last day 6/13.   #Proceed with Keytruda next week;  And in 2 weeks- Taxol- [at reduced dose of Taxol to 60 mg per metered square; Labs today reviewed.  #Acute diarrhea [2 week]; E.Coli enteritis- s/p abx- resolved.   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  Awaiting TSH from today.  # Left Knee- Bil-likely secondary to osteoarthritis less likely from [facility]. ? [facility]  STABLE.  # T2 thoracic metastatic disease [confirmed MRI Jan 2023]-currently s/p Radiation [ 6/13-last] S/p  Zometa treatment- STABLE.   #HPYPO Kalemia potassium 3.2 continue K-Dur 20  mg twice a day recommend inreasing fluid intake.   # PN-1-2 sec to taxol-on Neurontin 200 qhs-  # DM/poorly controlled- continue metformin 2000 mg q AM [Dr.Tullo]; on glipizide 10 mg XL. Continue follow up with  PCP.  On CGM.- PBF- 184 Stable.  # Mediport: Functioning.   Key-3week; Taxol-weekx3;1 w-off  # DISPOSITION:  # HOLD chemo today;de-access  # Follow up 1 week-  labs- cbc/cmp;Keytruda; thyroid profile-  # # Follow up 2  week-MD;   labs- cbc/cmp;taxol- Dr.B    All questions were answered. The patient/family knows to call the clinic with any problems, questions or concerns.   Cammie Sickle, MD 09/05/2021 3:46 PM

## 2021-09-05 NOTE — Progress Notes (Signed)
Per Duwayne Heck, CMA, per Dr. Rogue Bussing, no treatment today.

## 2021-09-05 NOTE — Assessment & Plan Note (Addendum)
#  Recurrent right breast cancer-s/p breast skin biopsy [FEB 2023- ER positive /PR HER2-0; however 2017-HER2 NEU-1+]; - recurrent- stage IV; NGS -FEB 2023- CPS =15%. Currently on Keytruda- taxol [2/13]; CT CHEST:  Since the PET of 04/15/2021, enlargement of the previously described hypermetabolic right low cervical/supraclavicular nodal metastasis. Foci of hyperenhancement within the right breast are new or more well-defined since 02/02/2021, suspicious for progressive residual/recurrent disease. Nonspecific mediastinal nodes, similar to slightly decreased in size;  New sclerosis at the site of a known T2 vertebral body metastasis.  # HOLD chemo today-given patient's recent E. coli similar diarrhea.  Patient is currently on palliative radiation therapy to her right supraclavicular nodal metastasis as well as T2 vertebral body.  Planning 30 Gray in 10 fractions [5/31 to 6/12] Last day 6/13.   #Proceed with Keytruda next week;  And in 2 weeks- Taxol- [at reduced dose of Taxol to 60 mg per metered square; Labs today reviewed.  #Acute diarrhea [2 week]; E.Coli enteritis- s/p abx- resolved.   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  Awaiting TSH from today.  # Left Knee- Bil-likely secondary to osteoarthritis less likely from [facility]. ? [facility]  STABLE.  # T2 thoracic metastatic disease [confirmed MRI Jan 2023]-currently s/p Radiation [ 6/13-last] S/p  Zometa treatment- STABLE.   #HPYPO Kalemia potassium 3.2 continue K-Dur 20 mg twice a day recommend inreasing fluid intake.   # PN-1-2 sec to taxol-on Neurontin 200 qhs-  # DM/poorly controlled- continue metformin 2000 mg q AM [Dr.Tullo]; on glipizide 10 mg XL. Continue follow up with  PCP.  On CGM.- PBF- 184 Stable.  # Mediport: Functioning.   Key-3week; Taxol-weekx3;1 w-off  # DISPOSITION:  # HOLD chemo today;de-access  # Follow up 1 week-  labs- cbc/cmp;Keytruda; thyroid profile-  # # Follow  up 2  week-MD;   labs- cbc/cmp;taxol- Dr.B

## 2021-09-06 ENCOUNTER — Other Ambulatory Visit: Payer: Self-pay

## 2021-09-06 ENCOUNTER — Ambulatory Visit
Admission: RE | Admit: 2021-09-06 | Discharge: 2021-09-06 | Disposition: A | Discharge status: Home / Self Care | Payer: PPO | Source: Ambulatory Visit | Attending: Radiation Oncology | Admitting: Radiation Oncology

## 2021-09-06 DIAGNOSIS — Z171 Estrogen receptor negative status [ER-]: Secondary | ICD-10-CM | POA: Diagnosis not present

## 2021-09-06 DIAGNOSIS — C50211 Malignant neoplasm of upper-inner quadrant of right female breast: Secondary | ICD-10-CM | POA: Diagnosis not present

## 2021-09-06 DIAGNOSIS — C7951 Secondary malignant neoplasm of bone: Secondary | ICD-10-CM | POA: Diagnosis not present

## 2021-09-06 DIAGNOSIS — Z510 Encounter for antineoplastic radiation therapy: Secondary | ICD-10-CM | POA: Diagnosis not present

## 2021-09-06 LAB — RAD ONC ARIA SESSION SUMMARY
Course Elapsed Days: 13
Plan Fractions Treated to Date: 10
Plan Prescribed Dose Per Fraction: 3 Gy
Plan Total Fractions Prescribed: 10
Plan Total Prescribed Dose: 30 Gy
Reference Point Dosage Given to Date: 30 Gy
Reference Point Session Dosage Given: 3 Gy
Session Number: 10

## 2021-09-09 ENCOUNTER — Other Ambulatory Visit: Payer: Self-pay

## 2021-09-09 DIAGNOSIS — Z171 Estrogen receptor negative status [ER-]: Secondary | ICD-10-CM

## 2021-09-12 ENCOUNTER — Inpatient Hospital Stay: Payer: PPO

## 2021-09-12 VITALS — BP 124/65 | HR 90 | Temp 97.8°F | Resp 18 | Wt 187.0 lb

## 2021-09-12 DIAGNOSIS — C50211 Malignant neoplasm of upper-inner quadrant of right female breast: Secondary | ICD-10-CM

## 2021-09-12 DIAGNOSIS — D4861 Neoplasm of uncertain behavior of right breast: Secondary | ICD-10-CM

## 2021-09-12 DIAGNOSIS — Z510 Encounter for antineoplastic radiation therapy: Secondary | ICD-10-CM | POA: Diagnosis not present

## 2021-09-12 LAB — CBC WITH DIFFERENTIAL/PLATELET
Abs Immature Granulocytes: 0.06 10*3/uL (ref 0.00–0.07)
Basophils Absolute: 0.1 10*3/uL (ref 0.0–0.1)
Basophils Relative: 1 %
Eosinophils Absolute: 0.1 10*3/uL (ref 0.0–0.5)
Eosinophils Relative: 3 %
HCT: 30.1 % — ABNORMAL LOW (ref 36.0–46.0)
Hemoglobin: 9.4 g/dL — ABNORMAL LOW (ref 12.0–15.0)
Immature Granulocytes: 2 %
Lymphocytes Relative: 20 %
Lymphs Abs: 0.8 10*3/uL (ref 0.7–4.0)
MCH: 27.9 pg (ref 26.0–34.0)
MCHC: 31.2 g/dL (ref 30.0–36.0)
MCV: 89.3 fL (ref 80.0–100.0)
Monocytes Absolute: 0.6 10*3/uL (ref 0.1–1.0)
Monocytes Relative: 14 %
Neutro Abs: 2.4 10*3/uL (ref 1.7–7.7)
Neutrophils Relative %: 60 %
Platelets: 197 10*3/uL (ref 150–400)
RBC: 3.37 MIL/uL — ABNORMAL LOW (ref 3.87–5.11)
RDW: 19.7 % — ABNORMAL HIGH (ref 11.5–15.5)
WBC: 3.9 10*3/uL — ABNORMAL LOW (ref 4.0–10.5)
nRBC: 0 % (ref 0.0–0.2)

## 2021-09-12 LAB — COMPREHENSIVE METABOLIC PANEL
ALT: 17 U/L (ref 0–44)
AST: 36 U/L (ref 15–41)
Albumin: 3.8 g/dL (ref 3.5–5.0)
Alkaline Phosphatase: 86 U/L (ref 38–126)
Anion gap: 11 (ref 5–15)
BUN: 11 mg/dL (ref 8–23)
CO2: 23 mmol/L (ref 22–32)
Calcium: 9.4 mg/dL (ref 8.9–10.3)
Chloride: 104 mmol/L (ref 98–111)
Creatinine, Ser: 0.93 mg/dL (ref 0.44–1.00)
GFR, Estimated: >60 mL/min (ref >60)
Glucose, Bld: 168 mg/dL — ABNORMAL HIGH (ref 70–99)
Potassium: 3.9 mmol/L (ref 3.5–5.1)
Sodium: 138 mmol/L (ref 135–145)
Total Bilirubin: 0.6 mg/dL (ref 0.3–1.2)
Total Protein: 7.5 g/dL (ref 6.5–8.1)

## 2021-09-12 MED ORDER — HEPARIN SOD (PORK) LOCK FLUSH 100 UNIT/ML IV SOLN
500.0000 [IU] | Freq: Once | INTRAVENOUS | Status: AC | PRN
  Administered 2021-09-12: 500 [IU]
  Filled 2021-09-12: qty 5

## 2021-09-12 MED ORDER — SODIUM CHLORIDE 0.9 % IV SOLN
Freq: Once | INTRAVENOUS | Status: AC
  Filled 2021-09-12: qty 250

## 2021-09-12 MED ORDER — SODIUM CHLORIDE 0.9 % IV SOLN
200.0000 mg | Freq: Once | INTRAVENOUS | Status: AC
  Administered 2021-09-12: 200 mg via INTRAVENOUS
  Filled 2021-09-12: qty 8

## 2021-09-13 LAB — THYROID PANEL WITH TSH
Free Thyroxine Index: 1.5 (ref 1.2–4.9)
T3 Uptake Ratio: 16 % — ABNORMAL LOW (ref 24–39)
T4, Total: 9.5 ug/dL (ref 4.5–12.0)
TSH: 29.7 u[IU]/mL — ABNORMAL HIGH (ref 0.450–4.500)

## 2021-09-16 MED FILL — Dexamethasone Sodium Phosphate Inj 100 MG/10ML: INTRAMUSCULAR | Qty: 1 | Status: AC

## 2021-09-19 ENCOUNTER — Inpatient Hospital Stay: Payer: PPO

## 2021-09-19 ENCOUNTER — Encounter: Payer: Self-pay | Admitting: Internal Medicine

## 2021-09-19 ENCOUNTER — Ambulatory Visit: Payer: PPO

## 2021-09-19 ENCOUNTER — Inpatient Hospital Stay: Payer: PPO | Admitting: Internal Medicine

## 2021-09-19 ENCOUNTER — Other Ambulatory Visit: Payer: PPO

## 2021-09-19 DIAGNOSIS — C50211 Malignant neoplasm of upper-inner quadrant of right female breast: Secondary | ICD-10-CM

## 2021-09-19 DIAGNOSIS — Z510 Encounter for antineoplastic radiation therapy: Secondary | ICD-10-CM | POA: Diagnosis not present

## 2021-09-19 DIAGNOSIS — D4861 Neoplasm of uncertain behavior of right breast: Secondary | ICD-10-CM

## 2021-09-19 DIAGNOSIS — Z171 Estrogen receptor negative status [ER-]: Secondary | ICD-10-CM | POA: Diagnosis not present

## 2021-09-19 LAB — CBC WITH DIFFERENTIAL/PLATELET
Abs Immature Granulocytes: 0.02 10*3/uL (ref 0.00–0.07)
Basophils Absolute: 0.1 10*3/uL (ref 0.0–0.1)
Basophils Relative: 1 %
Eosinophils Absolute: 0.1 10*3/uL (ref 0.0–0.5)
Eosinophils Relative: 3 %
HCT: 29.6 % — ABNORMAL LOW (ref 36.0–46.0)
Hemoglobin: 9.4 g/dL — ABNORMAL LOW (ref 12.0–15.0)
Immature Granulocytes: 0 %
Lymphocytes Relative: 18 %
Lymphs Abs: 0.9 10*3/uL (ref 0.7–4.0)
MCH: 27.8 pg (ref 26.0–34.0)
MCHC: 31.8 g/dL (ref 30.0–36.0)
MCV: 87.6 fL (ref 80.0–100.0)
Monocytes Absolute: 0.4 10*3/uL (ref 0.1–1.0)
Monocytes Relative: 9 %
Neutro Abs: 3.4 10*3/uL (ref 1.7–7.7)
Neutrophils Relative %: 69 %
Platelets: 163 10*3/uL (ref 150–400)
RBC: 3.38 MIL/uL — ABNORMAL LOW (ref 3.87–5.11)
RDW: 18.7 % — ABNORMAL HIGH (ref 11.5–15.5)
WBC: 4.9 10*3/uL (ref 4.0–10.5)
nRBC: 0 % (ref 0.0–0.2)

## 2021-09-19 LAB — COMPREHENSIVE METABOLIC PANEL
ALT: 17 U/L (ref 0–44)
AST: 34 U/L (ref 15–41)
Albumin: 3.8 g/dL (ref 3.5–5.0)
Alkaline Phosphatase: 88 U/L (ref 38–126)
Anion gap: 9 (ref 5–15)
BUN: 12 mg/dL (ref 8–23)
CO2: 24 mmol/L (ref 22–32)
Calcium: 9.4 mg/dL (ref 8.9–10.3)
Chloride: 105 mmol/L (ref 98–111)
Creatinine, Ser: 0.92 mg/dL (ref 0.44–1.00)
GFR, Estimated: >60 mL/min (ref >60)
Glucose, Bld: 148 mg/dL — ABNORMAL HIGH (ref 70–99)
Potassium: 4 mmol/L (ref 3.5–5.1)
Sodium: 138 mmol/L (ref 135–145)
Total Bilirubin: 0.7 mg/dL (ref 0.3–1.2)
Total Protein: 7.5 g/dL (ref 6.5–8.1)

## 2021-09-19 MED ORDER — HEPARIN SOD (PORK) LOCK FLUSH 100 UNIT/ML IV SOLN
INTRAVENOUS | Status: AC
  Administered 2021-09-19: 500 [IU]
  Filled 2021-09-19: qty 5

## 2021-09-19 MED ORDER — SODIUM CHLORIDE 0.9 % IV SOLN
10.0000 mg | Freq: Once | INTRAVENOUS | Status: AC
  Administered 2021-09-19: 10 mg via INTRAVENOUS
  Filled 2021-09-19: qty 10

## 2021-09-19 MED ORDER — FAMOTIDINE IN NACL 20-0.9 MG/50ML-% IV SOLN
20.0000 mg | Freq: Once | INTRAVENOUS | Status: AC
  Administered 2021-09-19: 20 mg via INTRAVENOUS
  Filled 2021-09-19: qty 50

## 2021-09-19 MED ORDER — HEPARIN SOD (PORK) LOCK FLUSH 100 UNIT/ML IV SOLN
500.0000 [IU] | Freq: Once | INTRAVENOUS | Status: AC | PRN
  Filled 2021-09-19: qty 5

## 2021-09-19 MED ORDER — ZOLEDRONIC ACID 4 MG/100ML IV SOLN
4.0000 mg | Freq: Once | INTRAVENOUS | Status: AC
  Administered 2021-09-19: 4 mg via INTRAVENOUS
  Filled 2021-09-19: qty 100

## 2021-09-19 MED ORDER — DIPHENHYDRAMINE HCL 50 MG/ML IJ SOLN
50.0000 mg | Freq: Once | INTRAMUSCULAR | Status: AC
  Administered 2021-09-19: 50 mg via INTRAVENOUS
  Filled 2021-09-19: qty 1

## 2021-09-19 MED ORDER — SODIUM CHLORIDE 0.9% FLUSH
10.0000 mL | Freq: Once | INTRAVENOUS | Status: AC
  Administered 2021-09-19: 10 mL via INTRAVENOUS
  Filled 2021-09-19: qty 10

## 2021-09-19 MED ORDER — SODIUM CHLORIDE 0.9 % IV SOLN
60.0000 mg/m2 | Freq: Once | INTRAVENOUS | Status: AC
  Administered 2021-09-19: 120 mg via INTRAVENOUS
  Filled 2021-09-19: qty 20

## 2021-09-19 MED ORDER — SODIUM CHLORIDE 0.9 % IV SOLN
Freq: Once | INTRAVENOUS | Status: AC
  Filled 2021-09-19: qty 250

## 2021-09-21 ENCOUNTER — Other Ambulatory Visit: Payer: Self-pay | Admitting: Internal Medicine

## 2021-09-21 DIAGNOSIS — Z171 Estrogen receptor negative status [ER-]: Secondary | ICD-10-CM

## 2021-09-21 NOTE — Telephone Encounter (Signed)
Component Ref Range & Units 2 d ago (09/19/21) 9 d ago (09/12/21) 2 wk ago (09/05/21) 3 wk ago (08/30/21) 3 wk ago (08/25/21) 4 wk ago (08/23/21) 1 mo ago (08/15/21)  Potassium 3.5 - 5.1 mmol/L 4.0  3.9  4.0  3.9  3.3 Low   3.2 Low   4.0

## 2021-09-23 DIAGNOSIS — Z171 Estrogen receptor negative status [ER-]: Secondary | ICD-10-CM

## 2021-09-23 DIAGNOSIS — C7951 Secondary malignant neoplasm of bone: Secondary | ICD-10-CM

## 2021-09-23 DIAGNOSIS — Z7985 Long-term (current) use of injectable non-insulin antidiabetic drugs: Secondary | ICD-10-CM | POA: Diagnosis not present

## 2021-09-23 DIAGNOSIS — E1169 Type 2 diabetes mellitus with other specified complication: Secondary | ICD-10-CM

## 2021-09-23 DIAGNOSIS — C50211 Malignant neoplasm of upper-inner quadrant of right female breast: Secondary | ICD-10-CM | POA: Diagnosis not present

## 2021-09-26 ENCOUNTER — Inpatient Hospital Stay: Payer: PPO | Attending: Internal Medicine

## 2021-09-26 ENCOUNTER — Inpatient Hospital Stay: Payer: PPO

## 2021-09-26 VITALS — BP 128/71 | HR 75 | Temp 96.6°F | Wt 185.3 lb

## 2021-09-26 DIAGNOSIS — Z170 Estrogen receptor positive status [ER+]: Secondary | ICD-10-CM | POA: Insufficient documentation

## 2021-09-26 DIAGNOSIS — C50211 Malignant neoplasm of upper-inner quadrant of right female breast: Secondary | ICD-10-CM | POA: Diagnosis not present

## 2021-09-26 DIAGNOSIS — G620 Drug-induced polyneuropathy: Secondary | ICD-10-CM | POA: Insufficient documentation

## 2021-09-26 DIAGNOSIS — E032 Hypothyroidism due to medicaments and other exogenous substances: Secondary | ICD-10-CM | POA: Insufficient documentation

## 2021-09-26 DIAGNOSIS — Z5111 Encounter for antineoplastic chemotherapy: Secondary | ICD-10-CM | POA: Insufficient documentation

## 2021-09-26 DIAGNOSIS — E1165 Type 2 diabetes mellitus with hyperglycemia: Secondary | ICD-10-CM | POA: Insufficient documentation

## 2021-09-26 DIAGNOSIS — Z79890 Hormone replacement therapy (postmenopausal): Secondary | ICD-10-CM | POA: Insufficient documentation

## 2021-09-26 DIAGNOSIS — E876 Hypokalemia: Secondary | ICD-10-CM | POA: Insufficient documentation

## 2021-09-26 DIAGNOSIS — C7951 Secondary malignant neoplasm of bone: Secondary | ICD-10-CM | POA: Insufficient documentation

## 2021-09-26 DIAGNOSIS — D4861 Neoplasm of uncertain behavior of right breast: Secondary | ICD-10-CM

## 2021-09-26 LAB — COMPREHENSIVE METABOLIC PANEL
ALT: 24 U/L (ref 0–44)
AST: 42 U/L — ABNORMAL HIGH (ref 15–41)
Albumin: 4 g/dL (ref 3.5–5.0)
Alkaline Phosphatase: 93 U/L (ref 38–126)
Anion gap: 10 (ref 5–15)
BUN: 11 mg/dL (ref 8–23)
CO2: 21 mmol/L — ABNORMAL LOW (ref 22–32)
Calcium: 8.7 mg/dL — ABNORMAL LOW (ref 8.9–10.3)
Chloride: 105 mmol/L (ref 98–111)
Creatinine, Ser: 0.82 mg/dL (ref 0.44–1.00)
GFR, Estimated: >60 mL/min (ref >60)
Glucose, Bld: 182 mg/dL — ABNORMAL HIGH (ref 70–99)
Potassium: 3.6 mmol/L (ref 3.5–5.1)
Sodium: 136 mmol/L (ref 135–145)
Total Bilirubin: 0.8 mg/dL (ref 0.3–1.2)
Total Protein: 7.5 g/dL (ref 6.5–8.1)

## 2021-09-26 LAB — CBC WITH DIFFERENTIAL/PLATELET
Abs Immature Granulocytes: 0.09 10*3/uL — ABNORMAL HIGH (ref 0.00–0.07)
Basophils Absolute: 0 10*3/uL (ref 0.0–0.1)
Basophils Relative: 1 %
Eosinophils Absolute: 0.1 10*3/uL (ref 0.0–0.5)
Eosinophils Relative: 3 %
HCT: 30.9 % — ABNORMAL LOW (ref 36.0–46.0)
Hemoglobin: 9.8 g/dL — ABNORMAL LOW (ref 12.0–15.0)
Immature Granulocytes: 2 %
Lymphocytes Relative: 18 %
Lymphs Abs: 1 10*3/uL (ref 0.7–4.0)
MCH: 27.8 pg (ref 26.0–34.0)
MCHC: 31.7 g/dL (ref 30.0–36.0)
MCV: 87.5 fL (ref 80.0–100.0)
Monocytes Absolute: 0.4 10*3/uL (ref 0.1–1.0)
Monocytes Relative: 6 %
Neutro Abs: 4 10*3/uL (ref 1.7–7.7)
Neutrophils Relative %: 70 %
Platelets: 219 10*3/uL (ref 150–400)
RBC: 3.53 MIL/uL — ABNORMAL LOW (ref 3.87–5.11)
RDW: 18.8 % — ABNORMAL HIGH (ref 11.5–15.5)
WBC: 5.6 10*3/uL (ref 4.0–10.5)
nRBC: 0 % (ref 0.0–0.2)

## 2021-09-26 MED ORDER — FAMOTIDINE IN NACL 20-0.9 MG/50ML-% IV SOLN
20.0000 mg | Freq: Once | INTRAVENOUS | Status: AC
  Administered 2021-09-26: 20 mg via INTRAVENOUS
  Filled 2021-09-26: qty 50

## 2021-09-26 MED ORDER — HEPARIN SOD (PORK) LOCK FLUSH 100 UNIT/ML IV SOLN
500.0000 [IU] | Freq: Once | INTRAVENOUS | Status: AC | PRN
  Administered 2021-09-26: 500 [IU]
  Filled 2021-09-26: qty 5

## 2021-09-26 MED ORDER — DIPHENHYDRAMINE HCL 50 MG/ML IJ SOLN
50.0000 mg | Freq: Once | INTRAMUSCULAR | Status: AC
  Administered 2021-09-26: 50 mg via INTRAVENOUS
  Filled 2021-09-26: qty 1

## 2021-09-26 MED ORDER — SODIUM CHLORIDE 0.9 % IV SOLN
60.0000 mg/m2 | Freq: Once | INTRAVENOUS | Status: AC
  Administered 2021-09-26: 120 mg via INTRAVENOUS
  Filled 2021-09-26: qty 20

## 2021-09-26 MED ORDER — SODIUM CHLORIDE 0.9 % IV SOLN
Freq: Once | INTRAVENOUS | Status: AC
  Filled 2021-09-26: qty 250

## 2021-09-26 MED ORDER — SODIUM CHLORIDE 0.9 % IV SOLN
10.0000 mg | Freq: Once | INTRAVENOUS | Status: AC
  Administered 2021-09-26: 10 mg via INTRAVENOUS
  Filled 2021-09-26: qty 10

## 2021-09-26 NOTE — Patient Instructions (Signed)
[facility] [place]  Discharge Instructions: Thank you for choosing [facility] to provide your oncology and hematology care.  If you have a lab appointment with the Cancer Center, please go directly to the Cancer Center and check in at the registration area.  Wear comfortable clothing and clothing appropriate for easy access to any Portacath or PICC line.   We strive to give you quality time with your provider. You may need to reschedule your appointment if you arrive late (15 or more minutes).  Arriving late affects you and other patients whose appointments are after yours.  Also, if you miss three or more appointments without notifying the office, you may be dismissed from the clinic at the provider's discretion.      For prescription refill requests, have your pharmacy contact our office and allow 72 hours for refills to be completed.    Today you received the following chemotherapy and/or immunotherapy agents Taxol       To help prevent nausea and vomiting after your treatment, we encourage you to take your nausea medication as directed.  BELOW ARE SYMPTOMS THAT SHOULD BE REPORTED IMMEDIATELY: *FEVER GREATER THAN 100.4 F (38 C) OR HIGHER *CHILLS OR SWEATING *NAUSEA AND VOMITING THAT IS NOT CONTROLLED WITH YOUR NAUSEA MEDICATION *UNUSUAL SHORTNESS OF BREATH *UNUSUAL BRUISING OR BLEEDING *URINARY PROBLEMS (pain or burning when urinating, or frequent urination) *BOWEL PROBLEMS (unusual diarrhea, constipation, pain near the anus) TENDERNESS IN MOUTH AND THROAT WITH OR WITHOUT PRESENCE OF ULCERS (sore throat, sores in mouth, or a toothache) UNUSUAL RASH, SWELLING OR PAIN  UNUSUAL VAGINAL DISCHARGE OR ITCHING   Items with * indicate a potential emergency and should be followed up as soon as possible or go to the Emergency Department if any problems should occur.  Please show the CHEMOTHERAPY ALERT CARD or IMMUNOTHERAPY ALERT CARD at check-in to the  Emergency Department and triage nurse.  Should you have questions after your visit or need to cancel or reschedule your appointment, please contact [facility] [place]  [phone] and follow the prompts.  Office hours are 8:00 a.m. to 4:30 p.m. Monday - Friday. Please note that voicemails left after 4:00 p.m. may not be returned until the following business day.  We are closed weekends and major holidays. You have access to a nurse at all times for urgent questions. Please call the main number to the clinic [phone] and follow the prompts.  For any non-urgent questions, you may also contact your provider using MyChart. We now offer e-Visits for anyone 18 and older to request care online for non-urgent symptoms. For details visit [email or web address].   Also download the MyChart app! Go to the app store, search "MyChart", open the app, select [place], and log in with your MyChart username and password.  Masks are optional in the cancer centers. If you would like for your care team to wear a mask while they are taking care of you, please let them know. For doctor visits, patients may have with them one support person who is at least 69 years old. At this time, visitors are not allowed in the infusion area.   

## 2021-10-03 ENCOUNTER — Encounter: Payer: Self-pay | Admitting: Internal Medicine

## 2021-10-03 ENCOUNTER — Inpatient Hospital Stay: Payer: PPO

## 2021-10-03 ENCOUNTER — Inpatient Hospital Stay (HOSPITAL_BASED_OUTPATIENT_CLINIC_OR_DEPARTMENT_OTHER): Payer: PPO | Admitting: Internal Medicine

## 2021-10-03 DIAGNOSIS — C50211 Malignant neoplasm of upper-inner quadrant of right female breast: Secondary | ICD-10-CM | POA: Diagnosis not present

## 2021-10-03 DIAGNOSIS — Z171 Estrogen receptor negative status [ER-]: Secondary | ICD-10-CM | POA: Diagnosis not present

## 2021-10-03 DIAGNOSIS — Z5111 Encounter for antineoplastic chemotherapy: Secondary | ICD-10-CM | POA: Diagnosis not present

## 2021-10-03 DIAGNOSIS — D4861 Neoplasm of uncertain behavior of right breast: Secondary | ICD-10-CM

## 2021-10-03 LAB — CBC WITH DIFFERENTIAL/PLATELET
Abs Immature Granulocytes: 0.03 10*3/uL (ref 0.00–0.07)
Basophils Absolute: 0 10*3/uL (ref 0.0–0.1)
Basophils Relative: 2 %
Eosinophils Absolute: 0.1 10*3/uL (ref 0.0–0.5)
Eosinophils Relative: 2 %
HCT: 28.5 % — ABNORMAL LOW (ref 36.0–46.0)
Hemoglobin: 9 g/dL — ABNORMAL LOW (ref 12.0–15.0)
Immature Granulocytes: 1 %
Lymphocytes Relative: 27 %
Lymphs Abs: 0.7 10*3/uL (ref 0.7–4.0)
MCH: 27.7 pg (ref 26.0–34.0)
MCHC: 31.6 g/dL (ref 30.0–36.0)
MCV: 87.7 fL (ref 80.0–100.0)
Monocytes Absolute: 0.2 10*3/uL (ref 0.1–1.0)
Monocytes Relative: 8 %
Neutro Abs: 1.6 10*3/uL — ABNORMAL LOW (ref 1.7–7.7)
Neutrophils Relative %: 60 %
Platelets: 193 10*3/uL (ref 150–400)
RBC: 3.25 MIL/uL — ABNORMAL LOW (ref 3.87–5.11)
RDW: 18.7 % — ABNORMAL HIGH (ref 11.5–15.5)
WBC: 2.6 10*3/uL — ABNORMAL LOW (ref 4.0–10.5)
nRBC: 0 % (ref 0.0–0.2)

## 2021-10-03 LAB — COMPREHENSIVE METABOLIC PANEL
ALT: 22 U/L (ref 0–44)
AST: 38 U/L (ref 15–41)
Albumin: 3.7 g/dL (ref 3.5–5.0)
Alkaline Phosphatase: 76 U/L (ref 38–126)
Anion gap: 9 (ref 5–15)
BUN: 12 mg/dL (ref 8–23)
CO2: 24 mmol/L (ref 22–32)
Calcium: 9 mg/dL (ref 8.9–10.3)
Chloride: 104 mmol/L (ref 98–111)
Creatinine, Ser: 0.83 mg/dL (ref 0.44–1.00)
GFR, Estimated: >60 mL/min (ref >60)
Glucose, Bld: 151 mg/dL — ABNORMAL HIGH (ref 70–99)
Potassium: 3.8 mmol/L (ref 3.5–5.1)
Sodium: 137 mmol/L (ref 135–145)
Total Bilirubin: 0.5 mg/dL (ref 0.3–1.2)
Total Protein: 7.1 g/dL (ref 6.5–8.1)

## 2021-10-03 MED ORDER — SODIUM CHLORIDE 0.9 % IV SOLN: 60.0000 mg/m2 | Freq: Once | INTRAVENOUS | Status: DC

## 2021-10-03 MED ORDER — SODIUM CHLORIDE 0.9 % IV SOLN
Freq: Once | INTRAVENOUS | Status: AC
  Filled 2021-10-03: qty 250

## 2021-10-03 MED ORDER — SODIUM CHLORIDE 0.9 % IV SOLN
10.0000 mg | Freq: Once | INTRAVENOUS | Status: AC
  Administered 2021-10-03: 10 mg via INTRAVENOUS
  Filled 2021-10-03: qty 1

## 2021-10-03 MED ORDER — HEPARIN SOD (PORK) LOCK FLUSH 100 UNIT/ML IV SOLN
500.0000 [IU] | Freq: Once | INTRAVENOUS | Status: DC | PRN
  Filled 2021-10-03: qty 5

## 2021-10-03 MED ORDER — DIPHENHYDRAMINE HCL 50 MG/ML IJ SOLN
50.0000 mg | Freq: Once | INTRAMUSCULAR | Status: AC
  Administered 2021-10-03: 50 mg via INTRAVENOUS
  Filled 2021-10-03: qty 1

## 2021-10-03 MED ORDER — SODIUM CHLORIDE 0.9 % IV SOLN
60.0000 mg/m2 | Freq: Once | INTRAVENOUS | Status: AC
  Administered 2021-10-03: 120 mg via INTRAVENOUS
  Filled 2021-10-03: qty 20

## 2021-10-03 MED ORDER — FAMOTIDINE IN NACL 20-0.9 MG/50ML-% IV SOLN
20.0000 mg | Freq: Once | INTRAVENOUS | Status: AC
  Administered 2021-10-03: 20 mg via INTRAVENOUS
  Filled 2021-10-03: qty 50

## 2021-10-03 MED ORDER — SODIUM CHLORIDE 0.9% FLUSH
10.0000 mL | Freq: Once | INTRAVENOUS | Status: AC
  Administered 2021-10-03: 10 mL via INTRAVENOUS
  Filled 2021-10-03: qty 10

## 2021-10-03 MED ORDER — SODIUM CHLORIDE 0.9 % IV SOLN
200.0000 mg | Freq: Once | INTRAVENOUS | Status: AC
  Administered 2021-10-03: 200 mg via INTRAVENOUS
  Filled 2021-10-03: qty 8

## 2021-10-03 MED ORDER — HEPARIN SOD (PORK) LOCK FLUSH 100 UNIT/ML IV SOLN
500.0000 [IU] | Freq: Once | INTRAVENOUS | Status: AC
  Administered 2021-10-03: 500 [IU] via INTRAVENOUS
  Filled 2021-10-03: qty 5

## 2021-10-03 NOTE — Assessment & Plan Note (Addendum)
#  Recurrent right breast cancer-s/p breast skin biopsy [FEB 2023- ER positive /PR HER2-0; however 2017-ER-NEGATIVE; -HER2 NEU-1+/LOW]; - recurrent- stage IV; NGS -FEB 2023- CPS =15%. Currently on Keytruda- taxol [2/13]; MAY 2023- CT CHEST:  Since the PET of 04/15/2021, enlargement of the previously described hypermetabolic right low cervical/supraclavicular nodal metastasis. Foci of hyperenhancement within the right breast are new or more well-defined since 02/02/2021, suspicious for progressive residual/recurrent disease. Nonspecific mediastinal nodes, similar to slightly decreased in size;  New sclerosis at the site of a known T2 vertebral body metastasis.   #Proceed with Taxol chemotherapy today- at 71 mg.m2. Labs today reviewed;  acceptable for treatment today.  Patient s/p palliative radiation therapy to her right supraclavicular nodal metastasis as well as T2 vertebral body [30 Gray in 10 fractions ;[5/31 to  6/13].  Ordered CT scan chest and pelvis; neck-in 4 weeks from now.  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  # Left Knee- Bil-likely secondary to osteoarthritis less likely from [facility] s/p [facility]- s/p aspiration; s/p steroid injection-   STABLE.  # T2 thoracic metastatic disease [confirmed MRI Jan 2023]-currently s/p Radiation [ 6/13-last] S/p  Zometa q4 W- STABLE.   # HYPO Kalemia potassium 3.2 continue K-Dur 20 mg twice a day recommend inreasing fluid intake.-STABLE today 4.0.   # PN-1-2 sec to taxol-on Neurontin 200 qhs-STABLE.   # DM/poorly controlled- continue metformin 2000 mg q AM [Dr.Tullo]; on glipizide 10 mg XL. Continue follow up with  PCP.  On CGM.- FBG- 151 -STABLE  # Mediport: Functioning.   Key-3week; Taxol-weekx3;1 w-off; Zometa- 6/26 [q4w]  # DISPOSITION:  # Taxol-keytruda today;  # Follow up 2 week- X-MD OR NP- labs- cbc/cmp; Taxol; Zometa  # # Follow up 3   week--labs- cbc/cmp; taxol-Keytruda;   # Follow up 4 week- - labs- cbc/cmp; Taxol; CT CAP - Dr.B

## 2021-10-03 NOTE — Patient Instructions (Signed)
[facility] AT [place]  Discharge Instructions: Thank you for choosing [facility] to provide your oncology and hematology care.  If you have a lab appointment with the [facility], please go directly to the [facility] and check in at the registration area.  Wear comfortable clothing and clothing appropriate for easy access to any Portacath or PICC line.   We strive to give you quality time with your provider. You may need to reschedule your appointment if you arrive late (15 or more minutes).  Arriving late affects you and other patients whose appointments are after yours.  Also, if you miss three or more appointments without notifying the office, you may be dismissed from the clinic at the provider's discretion.      For prescription refill requests, have your pharmacy contact our office and allow 72 hours for refills to be completed.    Today you received the following chemotherapy and/or immunotherapy agents Keytruda and Taxol        To help prevent nausea and vomiting after your treatment, we encourage you to take your nausea medication as directed.  BELOW ARE SYMPTOMS THAT SHOULD BE REPORTED IMMEDIATELY: *FEVER GREATER THAN 100.4 F (38 C) OR HIGHER *CHILLS OR SWEATING *NAUSEA AND VOMITING THAT IS NOT CONTROLLED WITH YOUR NAUSEA MEDICATION *UNUSUAL SHORTNESS OF BREATH *UNUSUAL BRUISING OR BLEEDING *URINARY PROBLEMS (pain or burning when urinating, or frequent urination) *BOWEL PROBLEMS (unusual diarrhea, constipation, pain near the anus) TENDERNESS IN MOUTH AND THROAT WITH OR WITHOUT PRESENCE OF ULCERS (sore throat, sores in mouth, or a toothache) UNUSUAL RASH, SWELLING OR PAIN  UNUSUAL VAGINAL DISCHARGE OR ITCHING   Items with * indicate a potential emergency and should be followed up as soon as possible or go to the Emergency Department if any problems should occur.  Please show the CHEMOTHERAPY ALERT CARD or IMMUNOTHERAPY ALERT CARD at  check-in to the Emergency Department and triage nurse.  Should you have questions after your visit or need to cancel or reschedule your appointment, please contact [facility] CANCER [place] AT [place]  [phone] and follow the prompts.  Office hours are 8:00 a.m. to 4:30 p.m. Monday - Friday. Please note that voicemails left after 4:00 p.m. may not be returned until the following business day.  We are closed weekends and major holidays. You have access to a nurse at all times for urgent questions. Please call the main number to the clinic [phone] and follow the prompts.  For any non-urgent questions, you may also contact your provider using MyChart. We now offer e-Visits for anyone 26 and older to request care online for non-urgent symptoms. For details visit [email or web address].   Also download the MyChart app! Go to the app store, search "MyChart", open the app, select [facility], and log in with your MyChart username and password.  Masks are optional in the cancer centers. If you would like for your care team to wear a mask while they are taking care of you, please let them know. For doctor visits, patients may have with them one support person who is at least 69 years old. At this time, visitors are not allowed in the infusion area.

## 2021-10-03 NOTE — Progress Notes (Signed)
Patient denies new problems/concerns today.   °

## 2021-10-03 NOTE — Progress Notes (Signed)
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 Byrnett.] [id number] Carbo-taxol-AC;  JAN 2022-s/p Lumpectomy & ALND- [ypT1a (30m ) ypN1 (2/11 LN-positive)]; s/p radiation end of April 2022.  #Early June 2022-Xeloda 1000 mg per metered square [2000] 2 weeks on 1 week of 6-8 cycles; July 11th 2022- STARTING cycle #3-cut down the dose to 1500 mg BID[sec to HFS].   # JAN 2023- A. SKIN, RIGHT BREAST; PUNCH BIOPSY: [Dr.Byrnett] - INVASIVE CARCINOMA WITH DERMAL INFILTRATION AND LYMPHATIC INVASION. ER- weak POSISTIVE [10%]; PR/her2 NEG [[facility] 0]; JAN 20th, 2023- IMPRESSION: 1. Although the hypermetabolic right axillary node has resolved since the prior PET, there is a new hypermetabolic right low cervical/supraclavicular node, suspicious for nodal metastasis. 2. Decrease in right breast hypermetabolism with nonspecific overlying skin thickening in the setting of prior lumpectomy. 3. Low-level hypermetabolism within the T2 vertebral body, suspicious for isolated osseous metastasis. This could be confirmed with pre and postcontrast thoracic spine MRI. 4. Hypermetabolic thoracic nodes, slightly progressive. Given progression, nodal metastasis are slightly favored over reactive etiologies. 5. Incidental findings, including: Aortic Atherosclerosis (ICD10-I70.0). Tiny hiatal hernia. Hepatic steatosis with nonspecific caudate lobe enlargemen  Comment:  The carcinoma is  morphologically similar to the post-neoadjuvant mammary  carcinoma seen in the January 2022 right breast wide excision and lymph  Nodes  # FEB 6th-Taxol single agent #1; FEB 13th, 2023- [CPS =15]; Taxol-Keytruda  MAY 2023- CT CHEST:  Since the PET of 04/15/2021, enlargement of the previously described hypermetabolic right low cervical/supraclavicular nodal metastasis. Foci of hyperenhancement within the right breast are new or more well-defined since 02/02/2021, suspicious for progressive residual/recurrent disease. Nonspecific mediastinal nodes, similar to slightly decreased in size;  New sclerosis at the site of a known T2 vertebral body metastasis.   # s/p palliative radiation therapy to her right supraclavicular nodal metastasis as well as T2 vertebral body [30 Gray in 10 fractions ;[5/31 to  6/13].  CONTINUED TAXOL-KEYTRUDA   DIAGNOSIS: Right breast cancer triple negative      Carcinoma of upper-inner quadrant of right breast in female, estrogen receptor negative ([place]  10/07/2019 Initial Diagnosis   Carcinoma of upper-inner quadrant of right breast in female, estrogen receptor negative ([place]   12/04/2019 Genetic Testing   Negative genetic testing. No pathogenic variants identified on the [facility] Common Hereditary Cancers Panel + Skin Cancers Panel. The report date is 12/04/2019.  The Common Hereditary Cancers Panel + Skin Cancers Panel offered by [facility] includes sequencing and/or deletion duplication testing of the following 54 genes: APC*, ATM*, AXIN2, BAP1, BARD1, BMPR1A, BRCA1, BRCA2, BRIP1, CDH1, CDK4, CDKN2A (p14ARF), CDKN2A (p16INK4a), CHEK2, CTNNA1, DICER1*, EPCAM*, GREM1*, HOXB13, KIT, MEN1*, MITF*, MLH1*, MSH2*, MSH3*, MSH6*, MUTYH, NBN, NF1*, NTHL1, PALB2, PDGFRA, PMS2*, POLD1*, POLE, POT1, PTCH1, PTEN*, RAD50, RAD51C, RAD51D, RB1*, RNF43, SDHA*, SDHB, SDHC*, SDHD, SMAD4, SMARCA4, STK11, SUFU, TP53, TSC1*, TSC2, VHL.    05/07/2020 Cancer Staging   Staging form: Breast, AJCC 8th  Edition - Clinical: Stage IIIB (cT2, cN1, cM0, G3, ER-, PR-, HER2-) - Signed by BCammie Sickle MD on 05/07/2020  04/18/2021 Cancer Staging   Staging form: Breast, AJCC 8th Edition - Pathologic: Stage IV (pT4d, pN3c, cM1, ER+, PR-, HER2-) - Signed by Cammie Sickle, MD on 04/18/2021 Nuclear grade: G3      HISTORY OF PRESENTING ILLNESS: Accompanied by husband.  Walking independently.  Ashlee Cox 71 y.o.  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   In the interim patient is currently status post palliative radiation to the right supraclavicular region; also to the T2 vertebral body.  She continues with Synthroid for her iatrogenic hypothyroidism.  She continues to be compliant with her Synthroid.  Patient denies any diarrhea.  No nausea vomiting. Patient complains of mild tingling and numbness in extremities. No falls.  Patient admits to resolution of the breast skin rash.  Complains of mild to moderate fatigue.   Review of Systems  Constitutional:  Positive for malaise/fatigue. Negative for chills, diaphoresis, fever and weight loss.  HENT:  Negative for nosebleeds and sore throat.   Eyes:  Negative for double vision.  Respiratory:  Negative for cough, hemoptysis, sputum production and wheezing.   Cardiovascular:  Negative for chest pain, palpitations, orthopnea and leg swelling.  Gastrointestinal:  Negative for abdominal pain, blood in stool, constipation and melena.  Genitourinary:  Negative for dysuria, frequency and urgency.  Musculoskeletal:  Positive for back pain and neck pain. Negative for joint pain.  Neurological:  Positive for tingling. Negative for dizziness, focal weakness, weakness and headaches.  Endo/Heme/Allergies:  Does not bruise/bleed easily.  Psychiatric/Behavioral:  Negative for depression. The patient is not nervous/anxious and does not have insomnia.       MEDICAL HISTORY:  Past Medical History:  Diagnosis Date   Cancer [facility]) 2021   right breast   Cyst, breast    benign   Diabetes mellitus without complication ([place])    Family history of melanoma    Family history of ovarian cancer    GERD (gastroesophageal reflux disease)    H/O: rheumatic fever    History of colonoscopy 2010   done bc of bleeding,  normal , due back in 5 yrs ([place])   Hyperlipidemia    Hypertension    Menopause    at age 70   Personal history of chemotherapy 09/2019   RIGHT  INVASIVE MAMMARY CARCINOMA   Personal history of radiation therapy     SURGICAL HISTORY: Past Surgical History:  Procedure Laterality Date   APPENDECTOMY  2006   BREAST BIOPSY Right 09/22/2019   INVASIVE MAMMARY CARCINOMA   BREAST CYST ASPIRATION Left 1999   BREAST LUMPECTOMY Left 04/23/2020   surgery with NL and SN    BREAST LUMPECTOMY WITH NEEDLE LOCALIZATION Right 04/23/2020   Procedure: BREAST LUMPECTOMY WITH NEEDLE LOCALIZATION;  Surgeon: Robert Bellow, MD;  Location: [facility] ORS;  Service: General;  Laterality: Right;   BREAST LUMPECTOMY WITH SENTINEL LYMPH NODE BIOPSY Right 04/23/2020   Procedure: BREAST LUMPECTOMY WITH SENTINEL LYMPH NODE BX;  Surgeon: Robert Bellow, MD;  Location: [facility] ORS;  Service: General;  Laterality: Right;   CHOLECYSTECTOMY  2006   COLONOSCOPY     PORTACATH PLACEMENT Left 10/06/2019   Procedure: INSERTION PORT-A-CATH;  Surgeon: Robert Bellow, MD;  Location: [facility] ORS;  Service: General;  Laterality: Left;   SUBMUCOSAL TATTOO INJECTION Right 10/06/2019   Procedure: Right axillary TATTOO INJECTION;  Surgeon: Robert Bellow, MD;  Location: [facility] ORS;  Service: General;  Laterality: Right;   VAGINAL DELIVERY  x3    SOCIAL HISTORY: Social History   Socioeconomic History   Marital status: Married    Spouse name: Chrissie Noa   Number of children: 3   Years of education: Not on file   Highest education level: Not on file   Occupational History   Occupation: Glass blower/designer: [employer]  Tobacco Use   Smoking status: Former    Years: 1.00    Types: Cigarettes    Quit date: 12/26/2005    Years since quitting: 15.7   Smokeless tobacco: Never   Tobacco comments:    smoked for less than 1 years,  1 cig/day  Vaping Use   Vaping Use: Never used  Substance and Sexual Activity   Alcohol use: No   Drug use: No   Sexual activity: Never    Partners: Female  Other Topics Concern   Not on file  Social History Narrative   Widowed, March 2014; remarried.      [employer] ret[employer]; quit smoking 1ppw; no alcohol.       Social Determinants of Health   Financial Resource Strain: Medium Risk (05/25/2021)   Overall Financial Resource Strain (CARDIA)    Difficulty of Paying Living Expenses: Somewhat hard  Food Insecurity: No Food Insecurity (05/18/2021)   Hunger Vital Sign    Worried About Running Out of Food in the Last Year: Never true    Ran Out of Food in the Last Year: Never true  Transportation Needs: No Transportation Needs (05/18/2021)   PRAPARE - Hydrologist (Medical): No    Lack of Transportation (Non-Medical): No  Physical Activity: Inactive (05/18/2021)   Exercise Vital Sign    Days of Exercise per Week: 0 days    Minutes of Exercise per Session: 0 min  Stress: No Stress Concern Present (05/18/2021)   [place]    Feeling of Stress : Not at all  Social Connections: [place] (05/18/2021)   Social Connection and Isolation Panel [NHANES]    Frequency of Communication with Friends and Family: More than three times a week    Frequency of Social Gatherings with Friends and Family: More than three times a week    Attends Religious Services: More than 4 times per year    Active Member of [employer] or Organizations: Yes    Attends Music therapist: More than 4 times per year    Marital Status: Married   Human resources officer Violence: Not At Risk (05/04/2021)   Humiliation, Afraid, Rape, and Kick questionnaire    Fear of Current or Ex-Partner: No    Emotionally Abused: No    Physically Abused: No    Sexually Abused: No    FAMILY HISTORY: Family History  Problem Relation Age of Onset   Cancer Mother 40       ovarian- died 4-5 years.    Heart disease Mother 108   Diabetes Mother    Stroke Father 88   Diabetes Father    Diabetes Sister    Melanoma Sister        survived   Breast cancer Neg Hx     ALLERGIES:  has No Known Allergies.  MEDICATIONS:  Current Outpatient Medications  Medication Sig Dispense Refill   acetaminophen (TYLENOL) 500 MG tablet Take 500 mg by mouth every 8 (eight) hours as needed for moderate pain.     calcium carbonate (OS-CAL) 600 MG TABS tablet Take 600 mg by mouth  daily.     Continuous Blood Gluc Sensor (FREESTYLE LIBRE 3 SENSOR) MISC Apply 1 each topically every 14 (fourteen) days. Place 1 sensor on the skin every 14 days. Use to check glucose continuously 6 each 3   gabapentin (NEURONTIN) 100 MG capsule Take 2 capsules (200 mg total) by mouth at bedtime. 180 capsule 0   glipiZIDE (GLUCOTROL XL) 10 MG 24 hr tablet Take 1 tablet (10 mg total) by mouth daily with breakfast. 90 tablet 1   levothyroxine (SYNTHROID) 100 MCG tablet Take 1 tablet (100 mcg total) by mouth daily before breakfast. 30 tablet 3   lidocaine-prilocaine (EMLA) cream Apply 1 application topically as needed. Apply to port and cover with saran wrap 1-2 hours prior to port access 30 g 1   losartan-hydrochlorothiazide (HYZAAR) 50-12.5 MG tablet Take 1 tablet by mouth once daily 90 tablet 0   lovastatin (MEVACOR) 40 MG tablet TAKE 1 TABLET BY MOUTH AT BEDTIME 90 tablet 1   metFORMIN (GLUCOPHAGE) 1000 MG tablet TAKE 2 TABLETS BY MOUTH ONCE DAILY WITH BREAKFAST 180 tablet 0   Multiple Vitamin (MULTIVITAMIN) tablet Take 1 tablet by mouth daily.     Multiple Vitamins-Minerals (MULTI-VITAMIN GUMMIES)  CHEW Chew 1 tablet by mouth 1 day or 1 dose.     omeprazole (PRILOSEC) 20 MG capsule Take 1 capsule by mouth once daily 90 capsule 0   potassium chloride SA (KLOR-CON M) 20 MEQ tablet Take 1 tablet by mouth twice daily 60 tablet 0   Semaglutide,0.25 or 0.5MG/DOS, (OZEMPIC, 0.25 OR 0.5 MG/DOSE,) 2 MG/1.5ML SOPN Inject 0.5 mg into the skin once a week. 1.5 mL 11   diphenhydrAMINE HCl, Sleep, (ZZZQUIL PO) Take 1 tablet by mouth at bedtime as needed. (Patient not taking: Reported on 10/03/2021)     No current facility-administered medications for this visit.   Facility-Administered Medications Ordered in Other Visits  Medication Dose Route Frequency Provider Last Rate Last Admin   heparin lock flush 100 unit/mL  500 Units Intravenous Once Charlaine Dalton R, MD       heparin lock flush 100 unit/mL  500 Units Intracatheter Once PRN Cammie Sickle, MD       PACLitaxel (TAXOL) 120 mg in sodium chloride 0.9 % 250 mL chemo infusion (</= 60m/m2)  60 mg/m2 (Treatment Plan Recorded) Intravenous Once BCammie Sickle MD          .  PHYSICAL EXAMINATION: ECOG PERFORMANCE STATUS: 0 - Asymptomatic  Vitals:   10/03/21 0900  BP: 131/72  Pulse: 83  Resp: 18  Temp: 98.5 F (36.9 C)    Filed Weights   10/03/21 0900  Weight: 189 lb 3.2 oz (85.8 kg)    Right supraclavicular lymphadenopathy felt. 2-3 cm in size.   Physical Exam HENT:     Head: Normocephalic and atraumatic.     Mouth/Throat:     Pharynx: No oropharyngeal exudate.  Eyes:     Pupils: Pupils are equal, round, and reactive to light.  Cardiovascular:     Rate and Rhythm: Normal rate and regular rhythm.  Pulmonary:     Effort: Pulmonary effort is normal. No respiratory distress.     Breath sounds: Normal breath sounds. No wheezing.  Abdominal:     General: Bowel sounds are normal. There is no distension.     Palpations: Abdomen is soft. There is no mass.     Tenderness: There is no abdominal tenderness. There is  no guarding or rebound.  Musculoskeletal:  General: No tenderness. Normal range of motion.     Cervical back: Normal range of motion and neck supple.  Skin:    General: Skin is warm.  Neurological:     Mental Status: She is alert and oriented to person, place, and time.  Psychiatric:        Mood and Affect: Affect normal.      LABORATORY DATA:  I have reviewed the data as listed Lab Results  Component Value Date   WBC 2.6 (L) 10/03/2021   HGB 9.0 (L) 10/03/2021   HCT 28.5 (L) 10/03/2021   MCV 87.7 10/03/2021   PLT 193 10/03/2021   Recent Labs    09/19/21 0812 09/26/21 0927 10/03/21 0842  NA 138 136 137  K 4.0 3.6 3.8  CL 105 105 104  CO2 24 21* 24  GLUCOSE 148* 182* 151*  BUN 12 11 12   CREATININE 0.92 0.82 0.83  CALCIUM 9.4 8.7* 9.0  GFRNONAA >60 >60 >60  PROT 7.5 7.5 7.1  ALBUMIN 3.8 4.0 3.7  AST 34 42* 38  ALT 17 24 22   ALKPHOS 88 93 76  BILITOT 0.7 0.8 0.5    RADIOGRAPHIC STUDIES: I have personally reviewed the radiological images as listed and agreed with the findings in the report. No results found.  ASSESSMENT & PLAN:   Carcinoma of upper-inner quadrant of right breast in female, estrogen receptor negative ([place]) #Recurrent right breast cancer-s/p breast skin biopsy [FEB 2023- ER positive /PR HER2-0; however 2017-ER-NEGATIVE; -HER2 NEU-1+/LOW]; - recurrent- stage IV; NGS -FEB 2023- CPS =15%. Currently on Keytruda- taxol [2/13]; MAY 2023- CT CHEST:  Since the PET of 04/15/2021, enlargement of the previously described hypermetabolic right low cervical/supraclavicular nodal metastasis. Foci of hyperenhancement within the right breast are new or more well-defined since 02/02/2021, suspicious for progressive residual/recurrent disease. Nonspecific mediastinal nodes, similar to slightly decreased in size;  New sclerosis at the site of a known T2 vertebral body metastasis.   #Proceed with Taxol chemotherapy today- at 55 mg.m2. Labs today reviewed;   acceptable for treatment today.  Patient s/p palliative radiation therapy to her right supraclavicular nodal metastasis as well as T2 vertebral body [30 Gray in 10 fractions ;[5/31 to  6/13].  Ordered CT scan chest and pelvis; neck-in 4 weeks from now.  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  # T2 thoracic metastatic disease [confirmed MRI Jan 2023]-currently s/p Radiation [ 6/13-last] S/p  Zometa q4 W- STABLE.   # HYPO [place] potassium 3.2 continue K-Dur 20 mg twice a day recommend inreasing fluid intake.-STABLE today 4.0.   # PN-1-2 sec to taxol-on Neurontin 200 qhs-STABLE.   # DM/poorly controlled- continue metformin 2000 mg q AM [Dr.Tullo]; on glipizide 10 mg XL. Continue follow up with  PCP.  On CGM.- FBG- 151 -STABLE  # Mediport: Functioning.   Key-3week; Taxol-weekx3;1 w-off; Zometa- 6/26 [q4w]  # DISPOSITION:  # Taxol-keytruda today;  # Follow up 2 week- X-MD OR NP- labs- cbc/cmp; Taxol; Zometa  # # Follow up 3  week--labs- cbc/cmp; taxol-Keytruda;   # Follow up 4 week- - labs- cbc/cmp; Taxol; CT CAP - Dr.B   All questions were answered. The patient/family knows to call the clinic with any problems, questions or concerns.   Cammie Sickle, MD  10/03/2021 11:41 AM

## 2021-10-04 LAB — THYROID PANEL WITH TSH
Free Thyroxine Index: 1.9 (ref 1.2–4.9)
T3 Uptake Ratio: 19 % — ABNORMAL LOW (ref 24–39)
T4, Total: 10 ug/dL (ref 4.5–12.0)
TSH: 20.1 u[IU]/mL — ABNORMAL HIGH (ref 0.450–4.500)

## 2021-10-06 ENCOUNTER — Other Ambulatory Visit: Payer: Self-pay | Admitting: Internal Medicine

## 2021-10-06 DIAGNOSIS — G620 Drug-induced polyneuropathy: Secondary | ICD-10-CM

## 2021-10-08 ENCOUNTER — Other Ambulatory Visit: Payer: Self-pay | Admitting: Internal Medicine

## 2021-10-10 ENCOUNTER — Other Ambulatory Visit: Payer: Self-pay | Admitting: Internal Medicine

## 2021-10-14 MED FILL — Dexamethasone Sodium Phosphate Inj 100 MG/10ML: INTRAMUSCULAR | Qty: 1 | Status: AC

## 2021-10-17 ENCOUNTER — Inpatient Hospital Stay (HOSPITAL_BASED_OUTPATIENT_CLINIC_OR_DEPARTMENT_OTHER): Payer: PPO | Admitting: Nurse Practitioner

## 2021-10-17 ENCOUNTER — Inpatient Hospital Stay: Payer: PPO

## 2021-10-17 ENCOUNTER — Other Ambulatory Visit: Payer: Self-pay

## 2021-10-17 ENCOUNTER — Encounter: Payer: Self-pay | Admitting: Nurse Practitioner

## 2021-10-17 VITALS — BP 135/70 | HR 91 | Temp 98.7°F | Resp 20 | Wt 187.6 lb

## 2021-10-17 DIAGNOSIS — Z5181 Encounter for therapeutic drug level monitoring: Secondary | ICD-10-CM

## 2021-10-17 DIAGNOSIS — C7951 Secondary malignant neoplasm of bone: Secondary | ICD-10-CM

## 2021-10-17 DIAGNOSIS — E032 Hypothyroidism due to medicaments and other exogenous substances: Secondary | ICD-10-CM | POA: Diagnosis not present

## 2021-10-17 DIAGNOSIS — Z171 Estrogen receptor negative status [ER-]: Secondary | ICD-10-CM | POA: Diagnosis not present

## 2021-10-17 DIAGNOSIS — Z7983 Long term (current) use of bisphosphonates: Secondary | ICD-10-CM | POA: Diagnosis not present

## 2021-10-17 DIAGNOSIS — G2581 Restless legs syndrome: Secondary | ICD-10-CM

## 2021-10-17 DIAGNOSIS — Z5111 Encounter for antineoplastic chemotherapy: Secondary | ICD-10-CM | POA: Diagnosis not present

## 2021-10-17 DIAGNOSIS — D4861 Neoplasm of uncertain behavior of right breast: Secondary | ICD-10-CM

## 2021-10-17 DIAGNOSIS — C50211 Malignant neoplasm of upper-inner quadrant of right female breast: Secondary | ICD-10-CM | POA: Diagnosis not present

## 2021-10-17 DIAGNOSIS — C50911 Malignant neoplasm of unspecified site of right female breast: Secondary | ICD-10-CM | POA: Diagnosis not present

## 2021-10-17 LAB — CBC WITH DIFFERENTIAL/PLATELET
Abs Immature Granulocytes: 0.01 10*3/uL (ref 0.00–0.07)
Basophils Absolute: 0 10*3/uL (ref 0.0–0.1)
Basophils Relative: 1 %
Eosinophils Absolute: 0.1 10*3/uL (ref 0.0–0.5)
Eosinophils Relative: 1 %
HCT: 30.2 % — ABNORMAL LOW (ref 36.0–46.0)
Hemoglobin: 9.3 g/dL — ABNORMAL LOW (ref 12.0–15.0)
Immature Granulocytes: 0 %
Lymphocytes Relative: 23 %
Lymphs Abs: 0.9 10*3/uL (ref 0.7–4.0)
MCH: 26.7 pg (ref 26.0–34.0)
MCHC: 30.8 g/dL (ref 30.0–36.0)
MCV: 86.8 fL (ref 80.0–100.0)
Monocytes Absolute: 0.4 10*3/uL (ref 0.1–1.0)
Monocytes Relative: 11 %
Neutro Abs: 2.5 10*3/uL (ref 1.7–7.7)
Neutrophils Relative %: 64 %
Platelets: 185 10*3/uL (ref 150–400)
RBC: 3.48 MIL/uL — ABNORMAL LOW (ref 3.87–5.11)
RDW: 18.6 % — ABNORMAL HIGH (ref 11.5–15.5)
WBC: 3.9 10*3/uL — ABNORMAL LOW (ref 4.0–10.5)
nRBC: 0 % (ref 0.0–0.2)

## 2021-10-17 LAB — COMPREHENSIVE METABOLIC PANEL
ALT: 15 U/L (ref 0–44)
AST: 31 U/L (ref 15–41)
Albumin: 3.8 g/dL (ref 3.5–5.0)
Alkaline Phosphatase: 84 U/L (ref 38–126)
Anion gap: 12 (ref 5–15)
BUN: 13 mg/dL (ref 8–23)
CO2: 22 mmol/L (ref 22–32)
Calcium: 9.3 mg/dL (ref 8.9–10.3)
Chloride: 104 mmol/L (ref 98–111)
Creatinine, Ser: 0.75 mg/dL (ref 0.44–1.00)
GFR, Estimated: >60 mL/min (ref >60)
Glucose, Bld: 171 mg/dL — ABNORMAL HIGH (ref 70–99)
Potassium: 3.7 mmol/L (ref 3.5–5.1)
Sodium: 138 mmol/L (ref 135–145)
Total Bilirubin: 0.6 mg/dL (ref 0.3–1.2)
Total Protein: 7.5 g/dL (ref 6.5–8.1)

## 2021-10-17 LAB — FERRITIN: Ferritin: 10 ng/mL — ABNORMAL LOW (ref 11–307)

## 2021-10-17 LAB — IRON AND TIBC
Iron: 39 ug/dL (ref 28–170)
Saturation Ratios: 7 % — ABNORMAL LOW (ref 10.4–31.8)
TIBC: 550 ug/dL — ABNORMAL HIGH (ref 250–450)
UIBC: 511 ug/dL

## 2021-10-17 LAB — VITAMIN B12: Vitamin B-12: 194 pg/mL (ref 180–914)

## 2021-10-17 LAB — FOLATE: Folate: >40 ng/mL (ref >5.9)

## 2021-10-17 LAB — MAGNESIUM: Magnesium: 1.6 mg/dL — ABNORMAL LOW (ref 1.7–2.4)

## 2021-10-17 MED ORDER — ZOLEDRONIC ACID 4 MG/100ML IV SOLN
4.0000 mg | Freq: Once | INTRAVENOUS | Status: AC
  Administered 2021-10-17: 4 mg via INTRAVENOUS
  Filled 2021-10-17: qty 100

## 2021-10-17 MED ORDER — HEPARIN SOD (PORK) LOCK FLUSH 100 UNIT/ML IV SOLN
500.0000 [IU] | Freq: Once | INTRAVENOUS | Status: AC
  Filled 2021-10-17: qty 5

## 2021-10-17 MED ORDER — HEPARIN SOD (PORK) LOCK FLUSH 100 UNIT/ML IV SOLN
INTRAVENOUS | Status: AC
  Administered 2021-10-17: 500 [IU] via INTRAVENOUS
  Filled 2021-10-17: qty 5

## 2021-10-17 MED ORDER — HEPARIN SOD (PORK) LOCK FLUSH 100 UNIT/ML IV SOLN
500.0000 [IU] | Freq: Once | INTRAVENOUS | Status: DC | PRN
  Filled 2021-10-17: qty 5

## 2021-10-17 MED ORDER — FAMOTIDINE IN NACL 20-0.9 MG/50ML-% IV SOLN
20.0000 mg | Freq: Once | INTRAVENOUS | Status: AC
  Administered 2021-10-17: 20 mg via INTRAVENOUS
  Filled 2021-10-17: qty 50

## 2021-10-17 MED ORDER — DIPHENHYDRAMINE HCL 50 MG/ML IJ SOLN
50.0000 mg | Freq: Once | INTRAMUSCULAR | Status: AC
  Administered 2021-10-17: 50 mg via INTRAVENOUS
  Filled 2021-10-17: qty 1

## 2021-10-17 MED ORDER — SODIUM CHLORIDE 0.9 % IV SOLN
Freq: Once | INTRAVENOUS | Status: AC
  Filled 2021-10-17: qty 250

## 2021-10-17 MED ORDER — SODIUM CHLORIDE 0.9% FLUSH
10.0000 mL | Freq: Once | INTRAVENOUS | Status: AC
  Administered 2021-10-17: 10 mL via INTRAVENOUS
  Filled 2021-10-17: qty 10

## 2021-10-17 MED ORDER — SODIUM CHLORIDE 0.9 % IV SOLN
10.0000 mg | Freq: Once | INTRAVENOUS | Status: AC
  Administered 2021-10-17: 10 mg via INTRAVENOUS
  Filled 2021-10-17: qty 10

## 2021-10-17 MED ORDER — SODIUM CHLORIDE 0.9 % IV SOLN
60.0000 mg/m2 | Freq: Once | INTRAVENOUS | Status: AC
  Administered 2021-10-17: 120 mg via INTRAVENOUS
  Filled 2021-10-17: qty 20

## 2021-10-17 NOTE — Addendum Note (Signed)
Addended by: Verlon Au on: 10/17/2021 04:40 PM   Modules accepted: Orders, Level of Service

## 2021-10-17 NOTE — Patient Instructions (Signed)
[facility] CTR AT [place]  Discharge Instructions: Thank you for choosing [facility] to provide your oncology and hematology care.  If you have a lab appointment with the [place], please go directly to the [place] and check in at the registration area.  Wear comfortable clothing and clothing appropriate for easy access to any Portacath or PICC line.   We strive to give you quality time with your provider. You may need to reschedule your appointment if you arrive late (15 or more minutes).  Arriving late affects you and other patients whose appointments are after yours.  Also, if you miss three or more appointments without notifying the office, you may be dismissed from the clinic at the provider's discretion.      For prescription refill requests, have your pharmacy contact our office and allow 72 hours for refills to be completed.    Today you received the following chemotherapy and/or immunotherapy agents taxol       To help prevent nausea and vomiting after your treatment, we encourage you to take your nausea medication as directed.  BELOW ARE SYMPTOMS THAT SHOULD BE REPORTED IMMEDIATELY: *FEVER GREATER THAN 100.4 F (38 C) OR HIGHER *CHILLS OR SWEATING *NAUSEA AND VOMITING THAT IS NOT CONTROLLED WITH YOUR NAUSEA MEDICATION *UNUSUAL SHORTNESS OF BREATH *UNUSUAL BRUISING OR BLEEDING *URINARY PROBLEMS (pain or burning when urinating, or frequent urination) *BOWEL PROBLEMS (unusual diarrhea, constipation, pain near the anus) TENDERNESS IN MOUTH AND THROAT WITH OR WITHOUT PRESENCE OF ULCERS (sore throat, sores in mouth, or a toothache) UNUSUAL RASH, SWELLING OR PAIN  UNUSUAL VAGINAL DISCHARGE OR ITCHING   Items with * indicate a potential emergency and should be followed up as soon as possible or go to the Emergency Department if any problems should occur.  Please show the CHEMOTHERAPY ALERT CARD or IMMUNOTHERAPY ALERT CARD at check-in to the  Emergency Department and triage nurse.  Should you have questions after your visit or need to cancel or reschedule your appointment, please contact [facility] CANCER [place] AT [place]  [phone] and follow the prompts.  Office hours are 8:00 a.m. to 4:30 p.m. Monday - Friday. Please note that voicemails left after 4:00 p.m. may not be returned until the following business day.  We are closed weekends and major holidays. You have access to a nurse at all times for urgent questions. Please call the main number to the clinic [phone] and follow the prompts.  For any non-urgent questions, you may also contact your provider using MyChart. We now offer e-Visits for anyone 69 and older to request care online for non-urgent symptoms. For details visit [email or web address].   Also download the MyChart app! Go to the app store, search "MyChart", open the app, select [place], and log in with your MyChart username and password.  Masks are optional in the cancer centers. If you would like for your care team to wear a mask while they are taking care of you, please let them know. For doctor visits, patients may have with them one support person who is at least 69 years old. At this time, visitors are not allowed in the infusion area.

## 2021-10-17 NOTE — Progress Notes (Addendum)
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 Byrnett.] [id number] Carbo-taxol-AC;  JAN 2022-s/p Lumpectomy & ALND- [ypT1a (40m ) ypN1 (2/11 LN-positive)]; s/p radiation end of April 2022.  #Early June 2022-Xeloda 1000 mg per metered square [2000] 2 weeks on 1 week of 6-8 cycles; July 11th 2022- STARTING cycle #3-cut down the dose to 1500 mg BID[sec to HFS].   # JAN 2023- A. SKIN, RIGHT BREAST; PUNCH BIOPSY: [Dr.Byrnett] - INVASIVE CARCINOMA WITH DERMAL INFILTRATION AND LYMPHATIC INVASION. ER- weak POSISTIVE [10%]; PR/her2 NEG [[facility] 0]; JAN 20th, 2023- IMPRESSION: 1. Although the hypermetabolic right axillary node has resolved since the prior PET, there is a new hypermetabolic right low cervical/supraclavicular node, suspicious for nodal metastasis. 2. Decrease in right breast hypermetabolism with nonspecific overlying skin thickening in the setting of prior lumpectomy. 3. Low-level hypermetabolism within the T2 vertebral body, suspicious for isolated osseous metastasis. This could be confirmed with pre and postcontrast thoracic spine MRI. 4. Hypermetabolic thoracic nodes, slightly progressive. Given progression, nodal metastasis are slightly favored over reactive etiologies. 5. Incidental findings, including: Aortic Atherosclerosis (ICD10-I70.0). Tiny hiatal hernia. Hepatic steatosis with nonspecific caudate lobe enlargemen  Comment:  The carcinoma is  morphologically similar to the post-neoadjuvant mammary  carcinoma seen in the January 2022 right breast wide excision and lymph  Nodes  # FEB 6th-Taxol single agent #1; FEB 13th, 2023- [CPS =15]; Taxol-Keytruda  MAY 2023- CT CHEST:  Since the PET of 04/15/2021, enlargement of the previously described hypermetabolic right low cervical/supraclavicular nodal metastasis. Foci of hyperenhancement within the right breast are new or more well-defined since 02/02/2021, suspicious for progressive residual/recurrent disease. Nonspecific mediastinal nodes, similar to slightly decreased in size;  New sclerosis at the site of a known T2 vertebral body metastasis.   # s/p palliative radiation therapy to her right supraclavicular nodal metastasis as well as T2 vertebral body [30 Gray in 10 fractions ;[5/31 to  6/13].  CONTINUED TAXOL-KEYTRUDA   DIAGNOSIS: Right breast cancer triple negative      Carcinoma of upper-inner quadrant of right breast in female, estrogen receptor negative ([facility]  10/07/2019 Initial Diagnosis   Carcinoma of upper-inner quadrant of right breast in female, estrogen receptor negative ([facility]   12/04/2019 Genetic Testing   Negative genetic testing. No pathogenic variants identified on the [facility] Common Hereditary Cancers Panel + Skin Cancers Panel. The report date is 12/04/2019.  The Common Hereditary Cancers Panel + Skin Cancers Panel offered by [facility] includes sequencing and/or deletion duplication testing of the following 54 genes: APC*, ATM*, AXIN2, BAP1, BARD1, BMPR1A, BRCA1, BRCA2, BRIP1, CDH1, CDK4, CDKN2A (p14ARF), CDKN2A (p16INK4a), CHEK2, CTNNA1, DICER1*, EPCAM*, GREM1*, HOXB13, KIT, MEN1*, MITF*, MLH1*, MSH2*, MSH3*, MSH6*, MUTYH, NBN, NF1*, NTHL1, PALB2, PDGFRA, PMS2*, POLD1*, POLE, POT1, PTCH1, PTEN*, RAD50, RAD51C, RAD51D, RB1*, RNF43, SDHA*, SDHB, SDHC*, SDHD, SMAD4, SMARCA4, STK11, SUFU, TP53, TSC1*, TSC2, VHL.    05/07/2020 Cancer Staging   Staging form: Breast, AJCC 8th  Edition - Clinical: Stage IIIB (cT2, cN1, cM0, G3, ER-, PR-, HER2-) - Signed by BCammie Sickle MD on 05/07/2020  04/18/2021 Cancer Staging   Staging form: Breast, AJCC 8th Edition - Pathologic: Stage IV (pT4d, pN3c, cM1, ER+, PR-, HER2-) - Signed by Cammie Sickle, MD on 04/18/2021 Nuclear grade: G3      HISTORY OF PRESENTING ILLNESS: Accompanied by husband. Walking independently.   Ashlee Cox 71 y.o.  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 Complains of nocturnal restless leg bilaterally. Otherwise has chronic mild tingling of hands. No falls. No nausea, vomiting, or diarrhea. Feels well otherwise. Pain is stable/well controlled. Fatigue is mild to moderate, intermittent.    Review of Systems  Constitutional:  Negative for chills, fever, malaise/fatigue and weight loss.  HENT:  Negative for hearing loss, nosebleeds, sore throat and tinnitus.   Eyes:  Negative for blurred vision and double vision.  Respiratory:  Negative for cough, hemoptysis, shortness of breath and wheezing.   Cardiovascular:  Negative for chest pain, palpitations and leg swelling.  Gastrointestinal:  Negative for abdominal pain, blood in stool, constipation, diarrhea, melena, nausea and vomiting.  Genitourinary:  Negative for dysuria and urgency.  Musculoskeletal:  Negative for back pain, falls, joint pain and myalgias.  Skin:  Negative for itching and rash.  Neurological:  Positive for sensory change. Negative for dizziness, tingling, loss of consciousness, weakness and headaches.  Endo/Heme/Allergies:  Negative for environmental allergies. Does not bruise/bleed easily.  Psychiatric/Behavioral:  Negative for depression. The patient is not nervous/anxious and does not have insomnia.      MEDICAL  HISTORY:  Past Medical History:  Diagnosis Date   Cancer [facility]) 2021   right breast   Cyst, breast    benign   Diabetes mellitus without complication ([place])    Family history of melanoma    Family history of ovarian cancer    GERD (gastroesophageal reflux disease)    H/O: rheumatic fever    History of colonoscopy 2010   done bc of bleeding,  normal , due back in 5 yrs ([place])   Hyperlipidemia    Hypertension    Menopause    at age 47   Personal history of chemotherapy 09/2019   RIGHT  INVASIVE MAMMARY CARCINOMA   Personal history of radiation therapy     SURGICAL HISTORY: Past Surgical History:  Procedure Laterality Date   APPENDECTOMY  2006   BREAST BIOPSY Right 09/22/2019   INVASIVE MAMMARY CARCINOMA   BREAST CYST ASPIRATION Left 1999   BREAST LUMPECTOMY Left 04/23/2020   surgery with NL and SN    BREAST LUMPECTOMY WITH NEEDLE LOCALIZATION Right 04/23/2020   Procedure: BREAST LUMPECTOMY WITH NEEDLE LOCALIZATION;  Surgeon: Robert Bellow, MD;  Location: [facility] ORS;  Service: General;  Laterality: Right;   BREAST LUMPECTOMY WITH SENTINEL LYMPH NODE BIOPSY Right 04/23/2020   Procedure: BREAST LUMPECTOMY WITH SENTINEL LYMPH NODE BX;  Surgeon: Robert Bellow, MD;  Location: [facility];  Service: General;  Laterality: Right;   CHOLECYSTECTOMY  2006   COLONOSCOPY     PORTACATH PLACEMENT Left 10/06/2019   Procedure: INSERTION PORT-A-CATH;  Surgeon: Robert Bellow, MD;  Location: [facility] ORS;  Service: General;  Laterality: Left;   SUBMUCOSAL TATTOO INJECTION Right 10/06/2019   Procedure: Right axillary TATTOO INJECTION;  Surgeon: Robert Bellow, MD;  Location: [facility] ORS;  Service: General;  Laterality: Right;   VAGINAL DELIVERY     x3    SOCIAL HISTORY:  Social History   Socioeconomic History   Marital status: Married    Spouse name: Chrissie Noa   Number of children: 3   Years of education: Not on file   Highest education level: Not on file  Occupational History    Occupation: Glass blower/designer: [employer]  Tobacco Use   Smoking status: Former    Years: 1.00    Types: Cigarettes    Quit date: 12/26/2005    Years since quitting: 15.8   Smokeless tobacco: Never   Tobacco comments:    smoked for less than 1 years,  1 cig/day  Vaping Use   Vaping Use: Never used  Substance and Sexual Activity   Alcohol use: No   Drug use: No   Sexual activity: Never    Partners: Female  Other Topics Concern   Not on file  Social History Narrative   Widowed, March 2014; remarried.      [employer] ret[employer]; quit smoking 1ppw; no alcohol.       Social Determinants of Health   Financial Resource Strain: Medium Risk (05/25/2021)   Overall Financial Resource Strain (CARDIA)    Difficulty of Paying Living Expenses: Somewhat hard  Food Insecurity: No Food Insecurity (05/18/2021)   Hunger Vital Sign    Worried About Running Out of Food in the Last Year: Never true    Ran Out of Food in the Last Year: Never true  Transportation Needs: No Transportation Needs (05/18/2021)   PRAPARE - Hydrologist (Medical): No    Lack of Transportation (Non-Medical): No  Physical Activity: Inactive (05/18/2021)   Exercise Vital Sign    Days of Exercise per Week: 0 days    Minutes of Exercise per Session: 0 min  Stress: No Stress Concern Present (05/18/2021)   [place]    Feeling of Stress : Not at all  Social Connections: [place] (05/18/2021)   Social Connection and Isolation Panel [NHANES]    Frequency of Communication with Friends and Family: More than three times a week    Frequency of Social Gatherings with Friends and Family: More than three times a week    Attends Religious Services: More than 4 times per year    Active Member of [employer] or Organizations: Yes    Attends Music therapist: More than 4 times per year    Marital Status: Married  Human resources officer  Violence: Not At Risk (05/04/2021)   Humiliation, Afraid, Rape, and Kick questionnaire    Fear of Current or Ex-Partner: No    Emotionally Abused: No    Physically Abused: No    Sexually Abused: No    FAMILY HISTORY: Family History  Problem Relation Age of Onset   Cancer Mother 59       ovarian- died 4-5 years.    Heart disease Mother 47   Diabetes Mother    Stroke Father 69   Diabetes Father    Diabetes Sister    Melanoma Sister        survived   Breast cancer Neg Hx     ALLERGIES:  has No Known Allergies.  MEDICATIONS:  Current Outpatient Medications  Medication Sig Dispense Refill   acetaminophen (TYLENOL) 500 MG tablet Take 500 mg by mouth every 8 (eight) hours as needed for moderate pain.     calcium carbonate (OS-CAL) 600 MG TABS tablet Take 600 mg by mouth daily.     Continuous  Blood Gluc Sensor (FREESTYLE LIBRE 3 SENSOR) MISC Apply 1 each topically every 14 (fourteen) days. Place 1 sensor on the skin every 14 days. Use to check glucose continuously 6 each 3   gabapentin (NEURONTIN) 100 MG capsule TAKE 2 CAPSULES BY MOUTH AT BEDTIME 180 capsule 0   glipiZIDE (GLUCOTROL XL) 10 MG 24 hr tablet Take 1 tablet (10 mg total) by mouth daily with breakfast. 90 tablet 1   levothyroxine (SYNTHROID) 100 MCG tablet Take 1 tablet (100 mcg total) by mouth daily before breakfast. 30 tablet 3   lidocaine-prilocaine (EMLA) cream Apply 1 application topically as needed. Apply to port and cover with saran wrap 1-2 hours prior to port access 30 g 1   losartan-hydrochlorothiazide (HYZAAR) 50-12.5 MG tablet Take 1 tablet by mouth once daily 90 tablet 0   lovastatin (MEVACOR) 40 MG tablet TAKE 1 TABLET BY MOUTH AT BEDTIME 90 tablet 1   metFORMIN (GLUCOPHAGE) 1000 MG tablet TAKE 2 TABLETS BY MOUTH ONCE DAILY WITH BREAKFAST 180 tablet 0   Multiple Vitamin (MULTIVITAMIN) tablet Take 1 tablet by mouth daily.     Multiple Vitamins-Minerals (MULTI-VITAMIN GUMMIES) CHEW Chew 1 tablet by mouth 1 day or  1 dose.     omeprazole (PRILOSEC) 20 MG capsule Take 1 capsule by mouth once daily 90 capsule 0   potassium chloride SA (KLOR-CON M) 20 MEQ tablet Take 1 tablet by mouth twice daily 60 tablet 0   Semaglutide,0.25 or 0.5MG/DOS, (OZEMPIC, 0.25 OR 0.5 MG/DOSE,) 2 MG/1.5ML SOPN Inject 0.5 mg into the skin once a week. 1.5 mL 11   diphenhydrAMINE HCl, Sleep, (ZZZQUIL PO) Take 1 tablet by mouth at bedtime as needed. (Patient not taking: Reported on 10/03/2021)     No current facility-administered medications for this visit.   Facility-Administered Medications Ordered in Other Visits  Medication Dose Route Frequency Provider Last Rate Last Admin   heparin lock flush 100 unit/mL  500 Units Intravenous Once Cammie Sickle, MD          PHYSICAL EXAMINATION: ECOG PERFORMANCE STATUS: 0 - Asymptomatic  Vitals:   10/17/21 0828  BP: 135/70  Pulse: 91  Resp: 20  Temp: 98.7 F (37.1 C)  SpO2: 100%    Filed Weights   10/17/21 0828  Weight: 187 lb 9.6 oz (85.1 kg)  Right supraclavicular lymphadenopathy felt. 2-3 cm in size.   Physical Exam HENT:     Head: Normocephalic and atraumatic.     Mouth/Throat:     Pharynx: No oropharyngeal exudate.  Eyes:     Pupils: Pupils are equal, round, and reactive to light.  Cardiovascular:     Rate and Rhythm: Normal rate and regular rhythm.  Pulmonary:     Effort: Pulmonary effort is normal. No respiratory distress.     Breath sounds: Normal breath sounds. No wheezing.  Abdominal:     General: Bowel sounds are normal. There is no distension.     Palpations: Abdomen is soft. There is no mass.     Tenderness: There is no abdominal tenderness. There is no guarding or rebound.  Musculoskeletal:        General: No tenderness. Normal range of motion.     Cervical back: Normal range of motion and neck supple.  Skin:    General: Skin is warm.  Neurological:     Mental Status: She is alert and oriented to person, place, and time.  Psychiatric:         Mood and Affect: Affect normal.  LABORATORY DATA:  I have reviewed the data as listed Lab Results  Component Value Date   WBC 3.9 (L) 10/17/2021   HGB 9.3 (L) 10/17/2021   HCT 30.2 (L) 10/17/2021   MCV 86.8 10/17/2021   PLT 185 10/17/2021   Recent Labs    09/19/21 0812 09/26/21 0927 10/03/21 0842  NA 138 136 137  K 4.0 3.6 3.8  CL 105 105 104  CO2 24 21* 24  GLUCOSE 148* 182* 151*  BUN 12 11 12   CREATININE 0.92 0.82 0.83  CALCIUM 9.4 8.7* 9.0  GFRNONAA >60 >60 >60  PROT 7.5 7.5 7.1  ALBUMIN 3.8 4.0 3.7  AST 34 42* 38  ALT 17 24 22   ALKPHOS 88 93 76  BILITOT 0.7 0.8 0.5     RADIOGRAPHIC STUDIES: I have personally reviewed the radiological images as listed and agreed with the findings in the report. No results found.  ASSESSMENT & PLAN:   No problem-specific Assessment & Plan notes found for this encounter.  Carcinoma of upper-inner quadrant of right breast in female, estrogen receptor negative ([place]) #Recurrent right breast cancer-s/p breast skin biopsy [FEB 2023- ER positive /PR HER2-0; however 2017-ER-NEGATIVE; -HER2 NEU-1+/LOW]; - recurrent- stage IV; NGS -FEB 2023- CPS =15%. Currently on Keytruda- taxol [2/13]; MAY 2023- CT CHEST:  Since the PET of 04/15/2021, enlargement of the previously described hypermetabolic right low cervical/supraclavicular nodal metastasis. Foci of hyperenhancement within the right breast are new or more well-defined since 02/02/2021, suspicious for progressive residual/recurrent disease. Nonspecific mediastinal nodes, similar to slightly decreased in size;  New sclerosis at the site of a known T2 vertebral body metastasis.    #Proceed with Taxol chemotherapy today- at 60 mg/m2. Cbc reviewed today. CMP pending at time of appointment. Clinically, patient acceptable for treatment. Will review labs prior to receiving treatment.   # Bone metastases- Patient s/p palliative radiation therapy to her right supraclavicular nodal metastasis  as well as T2 vertebral body [30 Gray in 10 fractions ;[5/31 to  6/13].  Ordered CT scan chest and pelvis; neck prior to MD appt as planned.    # Iatrogenic hypothyroidism [May 2023]-from Keytruda-  on synthroid 100 mcg in AM.june 2023- TSH 29; TSH from 10/03/21 was 20. T4 is normal at 10. Free thyroxine index is also normal. TSH Improved but remains elevated. Continue current dosing.    # Left Knee- Bil-likely secondary to osteoarthritis less likely from [facility]; s/p [facility]- s/p aspiration; s/p steroid injection-   STABLE.   # T2 thoracic metastatic disease [confirmed MRI Jan 2023]-currently s/p Radiation [ 6/13-last] S/p  Zometa q4 W- STABLE.    # HYPO [place] potassium 3.2 continue K-Dur 20 mg twice a day recommend inreasing fluid intake.-STABLE today 4.0.    # PN-1-2 sec to taxol-on Neurontin 200 qhs-STABLE.    # DM/poorly controlled- continue metformin 2000 mg q AM [Dr.Tullo]; on glipizide 10 mg XL. Continue follow up with  PCP.  On CGM.- FBG- 151 -STABLE  # Restless leg- nocturnal. Reviewed possible etiologies. Plan for additional labs today. Management based on results. Patient was found to have diminished ferritin and iron saturation conssitent with iron deficiency. Hemoglobin decreased as well. Reviewed management options including oral iron trial vs IV iron. Patient prefers IV iron. Risks vs benefits reviewed including possible allergic reaction, anaphylaxis. Patient verbalizes consent and wishes to proceed. Plan for venofer x 4. If she has ongoing symptoms despite improvement in iron stores, re-evaluate.   # B12 deficiency - B12 subtherapeutic. Goal >  500. Start oral b12 supplement.    # Mediport: Functioning.    Key-3week; Taxol-week x 3; 1 w-off; Zometa- 6/26 [q4w]   # DISPOSITION: Add on labs- ferritin, iron studies, b12, folate, and mag Taxol; Zometa today pending cmp results Follow up as scheduled  All questions were answered. The patient/family knows to call the  clinic with any problems, questions or concerns.  Verlon Au, NP 10/17/2021

## 2021-10-18 ENCOUNTER — Telehealth: Payer: Self-pay

## 2021-10-18 NOTE — Telephone Encounter (Signed)
Received pt's patient assistance medication. Pt is aware that it is ready for pick up.   Ozempic: 5 boxes

## 2021-10-19 ENCOUNTER — Ambulatory Visit (INDEPENDENT_AMBULATORY_CARE_PROVIDER_SITE_OTHER): Payer: PPO | Admitting: *Deleted

## 2021-10-19 ENCOUNTER — Ambulatory Visit
Admission: RE | Admit: 2021-10-19 | Discharge: 2021-10-19 | Disposition: A | Discharge status: Home / Self Care | Payer: PPO | Source: Ambulatory Visit | Attending: Radiation Oncology | Admitting: Radiation Oncology

## 2021-10-19 ENCOUNTER — Encounter: Payer: Self-pay | Admitting: Radiation Oncology

## 2021-10-19 DIAGNOSIS — C50919 Malignant neoplasm of unspecified site of unspecified female breast: Secondary | ICD-10-CM

## 2021-10-19 DIAGNOSIS — I10 Essential (primary) hypertension: Secondary | ICD-10-CM

## 2021-10-19 DIAGNOSIS — E113559 Type 2 diabetes mellitus with stable proliferative diabetic retinopathy, unspecified eye: Secondary | ICD-10-CM

## 2021-10-19 DIAGNOSIS — C50211 Malignant neoplasm of upper-inner quadrant of right female breast: Secondary | ICD-10-CM

## 2021-10-19 NOTE — Progress Notes (Signed)
Radiation Oncology Follow up Note  Name: Ashlee Cox   Date:   10/19/2021 MRN:  [id number] DOB: 18-Apr-1950    This 71 y.o. female presents to the clinic today for 1 month follow-up status post palliative radiation therapy to her right supraclavicular fossa as well as T2 vertebral body for metastatic involvement stage IV triple negative breast cancer.  REFERRING PROVIDER: [name], MD  HPI: Patient is a 71 year old female now at 1 month having completed palliative radiation therapy to her right supraclavicular fossa as well as T2 vertebral body for involvement of metastatic triple negative breast cancer.  Seen today in routine follow-up she is doing well she is in no pain at all.  She is having no significant side effects.  Still has persistent thickness in the right supraclavicular fossa although that may be related to scarring..  She is currently on Keytruda and Taxol which she is tolerating well.  She is scheduled for follow-up CT scan next week.  COMPLICATIONS OF TREATMENT: none  FOLLOW UP COMPLIANCE: keeps appointments   PHYSICAL EXAM:  BP (P) 116/67   Pulse (P) 90   Wt (P) 185 lb (83.9 kg)   BMI (P) 28.98 kg/m  Patient has persistent fullness in right supraclavicular fossa.  Well-developed well-nourished patient in NAD. HEENT reveals PERLA, EOMI, discs not visualized.  Oral cavity is clear. No oral mucosal lesions are identified. Neck is clear without evidence of cervical or supraclavicular adenopathy. Lungs are clear to A&P. Cardiac examination is essentially unremarkable with regular rate and rhythm without murmur rub or thrill. Abdomen is benign with no organomegaly or masses noted. Motor sensory and DTR levels are equal and symmetric in the upper and lower extremities. Cranial nerves II through XII are grossly intact. Proprioception is intact. No peripheral adenopathy or edema is identified. No motor or sensory levels are noted. Crude visual fields are within normal  range.  RADIOLOGY RESULTS: CT scan to be performed next week will be reviewed.  PLAN: Present time patient is doing well having no significant pain or discomfort.  I will see her back in 5 months for follow-up.  She continues close follow-up care and treatment through medical oncology.  There will refer back should that be indicated.  Patient is to call with any concerns.  I would like to take this opportunity to thank you for allowing me to participate in the care of your patient.Noreene Filbert, MD

## 2021-10-19 NOTE — Chronic Care Management (AMB) (Signed)
Chronic Care Management   CCM RN Visit Note  10/19/2021 Name: Ashlee Cox MRN: [id number] DOB: 03/18/1951  Subjective: Ashlee Cox is a 71 y.o. year old female who is a primary care patient of Ashlee Mc, MD. The care management team was consulted for assistance with disease management and care coordination needs.    Engaged with patient by telephone for follow up visit in response to provider referral for case management and/or care coordination services.   Consent to Services:  The patient was given information about Chronic Care Management services, agreed to services, and gave verbal consent prior to initiation of services.  Please see initial visit note for detailed documentation.   Patient agreed to services and verbal consent obtained.   Assessment: Review of patient past medical history, allergies, medications, health status, including review of consultants reports, laboratory and other test data, was performed as part of comprehensive evaluation and provision of chronic care management services.   SDOH (Social Determinants of Health) assessments and interventions performed:    CCM Care Plan  No Known Allergies  Outpatient Encounter Medications as of 10/19/2021  Medication Sig   acetaminophen (TYLENOL) 500 MG tablet Take 500 mg by mouth every 8 (eight) hours as needed for moderate pain.   calcium carbonate (OS-CAL) 600 MG TABS tablet Take 600 mg by mouth daily.   Continuous Blood Gluc Sensor (FREESTYLE LIBRE 3 SENSOR) MISC Apply 1 each topically every 14 (fourteen) days. Place 1 sensor on the skin every 14 days. Use to check glucose continuously   diphenhydrAMINE HCl, Sleep, (ZZZQUIL PO) Take 1 tablet by mouth at bedtime as needed. (Patient not taking: Reported on 10/03/2021)   gabapentin (NEURONTIN) 100 MG capsule TAKE 2 CAPSULES BY MOUTH AT BEDTIME   glipiZIDE (GLUCOTROL XL) 10 MG 24 hr tablet Take 1 tablet (10 mg total) by mouth daily with breakfast.    levothyroxine (SYNTHROID) 100 MCG tablet Take 1 tablet (100 mcg total) by mouth daily before breakfast.   lidocaine-prilocaine (EMLA) cream Apply 1 application topically as needed. Apply to port and cover with saran wrap 1-2 hours prior to port access   losartan-hydrochlorothiazide (HYZAAR) 50-12.5 MG tablet Take 1 tablet by mouth once daily   lovastatin (MEVACOR) 40 MG tablet TAKE 1 TABLET BY MOUTH AT BEDTIME   metFORMIN (GLUCOPHAGE) 1000 MG tablet TAKE 2 TABLETS BY MOUTH ONCE DAILY WITH BREAKFAST   Multiple Vitamin (MULTIVITAMIN) tablet Take 1 tablet by mouth daily.   Multiple Vitamins-Minerals (MULTI-VITAMIN GUMMIES) CHEW Chew 1 tablet by mouth 1 day or 1 dose.   omeprazole (PRILOSEC) 20 MG capsule Take 1 capsule by mouth once daily   potassium chloride SA (KLOR-CON M) 20 MEQ tablet Take 1 tablet by mouth twice daily   Semaglutide,0.25 or 0.'5MG'$ /DOS, (OZEMPIC, 0.25 OR 0.5 MG/DOSE,) 2 MG/1.5ML SOPN Inject 0.5 mg into the skin once a week.   No facility-administered encounter medications on file as of 10/19/2021.    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 ([place]) 06/20/2021   Acquired trigger finger 05/11/2020   Drug-induced polyneuropathy ([place]) 05/07/2020   Type 2 diabetes mellitus with stable proliferative retinopathy, without long-term current use of insulin ([place]) 05/07/2020   Genetic testing 12/05/2019   Family history of ovarian cancer    Family history of melanoma    Carcinoma of upper-inner quadrant of right breast in female, estrogen receptor negative ([place]) 10/07/2019   Neoplasm of uncertain behavior of upper  outer quadrant of female breast, right 09/25/2019   Osteopenia after menopause 03/23/2018   Constipation 08/04/2017   Elevated LFTs 08/05/2016   Psoriasis of scalp 12/10/2015   Diabetic retinopathy ([place]) 12/08/2015   Encounter for preventive health examination 07/26/2012   Abnormal EKG 01/12/2011   Hypertension  12/27/2010   Hyperlipidemia with target LDL less than 100 12/27/2010   H/O: rheumatic fever    History of colonoscopy     Conditions to be addressed/monitored:DMII and CANCER  Care Plan : RNCM  General Plan of Care (Adult)   RESOLVING DUE TO DUPLICATE GOALS/TRANSITIONING TO CARE COORDINATION  Updates made by Ashlee Singleton, RN since 10/19/2021 12:00 AM  Completed 10/19/2021   Problem: KNOWLEDGE DEFICIT RELATED TO SELF CARE MANAGEMENT OF CHRONIC CONDITIONS Resolved 10/19/2021  Priority: Medium  Note:   RESOLVING DUE TO DUPLICATE GOALS/TRANSITIONING TO CARE COORDINATION    Goal: PATIENT WILL WORK WITH CCM TEAM TO INCREASEKNOWLEDGE OF CHRONIC CONDITIONS Completed 10/19/2021  Start Date: 05/04/2021  Expected End Date: 05/04/2021  Priority: Medium  Note:   RESOLVING DUE TO DUPLICATE GOALS/TRANSITIONING TO CARE COORDINATION   (7/26--CONTINUES WITH RADIATION DAILY.  NEEDS TO SCHEDULE PCP F/U FOR A1C AND FOOT EXAM, EYE EXAM NEEDED IN SEPTEMBER)   Current Barriers:  Chronic Disease Management support and education needs related to DMII and BREAST CANCER ;  Patient with recurrent breast cancer post right lumpectomy.  Has porta cath and restarting chemotherapy this week.  In great spirits and has family support.  History of diabetes.  Fasting blood sugar was 247 with recent fasting ranges of 170-200.  Was told chemo would increase blood sugars due to steroids.  BP 132/89 with recent ranges of 100-130/50-80's  3/8--continues to feel ok; awaiting next chemo plan.  Does have elevated liver enzymes the doctors are evaluating.  Still in good spirits.  Blood sugars have been elevated.  Fasting blood sugar this morning was 160 with recent ranges of 111-160's, discussed chemo elevating blood sugars.  Discussed CCM Pharm D Ashlee Cox changing positions and Pharmacy tech Ashlee Cox assisting with medication assistance application  0/86--[name] she is doing well.  Tolerating chemotherapy, without major complications or  side effects.  Fasting blood sugar this morning was 121 withh recent ranges of 120's.  Denies any chest pains, SOB, swelling or any major issues or concerns a this time.  Awaiting medication Ozempic to be mailed.  6/7--patient in good spirits.  Now doing daily radiation and tolerating well.  Did have episode of diarrhea that has since resolved.  CBG th morning 90.  BP 123/70.  Denies any hypo/hyperglycemia or hypo/hypertension.  Denies pain or any issues or concerns  RNCM Clinical Goal(s):  Patient will verbalize understanding of plan for management of DMII and BREAST CANCER as evidenced by better glucose ranges and tolerating chemotherapy demonstrate improved health management independence as evidenced by decreasing Hgb A1C by 0.3 points and completing course of chemotherapy        through collaboration with RN Care manager, provider, and care team.   Interventions: 1:1 collaboration with primary care provider regarding development and update of comprehensive plan of care as evidenced by provider attestation and co-signature Inter-disciplinary care team collaboration (see longitudinal plan of care) Evaluation of current treatment plan related to  self management and patient's adherence to plan as established by provider   Diabetes:  (Status: Goal on Track (progressing): YES.) Long Term Goal   Lab Results  Component Value Date   HGBA1C 9.2 (A) 04/20/2021   @  Assessed patient's understanding of A1c goal: <7% Provided education to patient about basic DM disease process; Reviewed medications with patient and discussed importance of medication adherence;        Reviewed prescribed diet with patient low carbohydrate; Counseled on importance of regular laboratory monitoring as prescribed;        Discussed plans with patient for ongoing care management follow up and provided patient with direct contact information for care management team;      Provided patient with written educational materials  related to hypo and hyperglycemia and importance of correct treatment;       Advised patient, providing education and rationale, to check cbg twice daily and when you have symptoms of low or high blood sugar and record        Screening for signs and symptoms of depression related to chronic disease state;        Discussed healthier food and drink options Discussed what elevates blood sugars Confirmed patient has Ozempic injection  Oncology:  (Goal on Track (progressing): YES.) Long Term Goal  Assessment of understanding of oncology diagnosis:  Assessed patient understanding of cancer diagnosis and recommended treatment plan Reviewed upcoming provider appointments and treatment appointments Assessed available transportation to appointments and treatments. Has consistent/reliable transportation: Yes Assessed support system. Has consistent/reliable family or other support: Yes Nutrition assessment performed Empathy and emotional support provided to patient  Patient Goals/Self-Care Activities: Take medications as prescribed   Attend all scheduled provider appointments Call provider office for new concerns or questions  check blood sugar at prescribed times: twice daily and when you have symptoms of low or high blood sugar enter blood sugar readings and medication or insulin into daily log take the blood sugar log to all doctor visits set a realistic goal keep feet up while sitting wash and dry feet carefully every day Call provider or this rncm if you do not have Ozempic by this Friday        Plan:The care management team will reach out to the patient again over the next 60 days.  Hubert Azure RN, MSN RN Care Management Coordinator [place] [phone] [email or web address]

## 2021-10-19 NOTE — Patient Instructions (Signed)
Visit Information  Thank you for taking time to visit with me today. Please don't hesitate to contact me if I can be of assistance to you before our next scheduled telephone appointment.  Following are the goals we discussed today:   Our next appointment is by telephone on 8/31 at 1000  Please call the care guide team at [phone] if you need to cancel or reschedule your appointment.   If you are experiencing a Mental Health or [place] or need someone to talk to, please call the Suicide and Crisis Lifeline: 988 call the [place] National Suicide Prevention Lifeline: 725-397-2120 or TTY: (507)672-2036 TTY (931)762-4220) to talk to a trained counselor call 1-800-273-TALK (toll free, 24 hour hotline) call 911   Patient verbalizes understanding of instructions and care plan provided today and agrees to view in [place]. Active MyChart status and patient understanding of how to access instructions and care plan via MyChart confirmed with patient.     Hubert Azure RN, MSN RN Care Management Coordinator [facility] [phone] [email or web address]

## 2021-10-20 ENCOUNTER — Other Ambulatory Visit: Payer: Self-pay

## 2021-10-21 ENCOUNTER — Ambulatory Visit
Admission: RE | Admit: 2021-10-21 | Discharge: 2021-10-21 | Disposition: A | Discharge status: Home / Self Care | Payer: PPO | Source: Ambulatory Visit | Attending: Internal Medicine | Admitting: Internal Medicine

## 2021-10-21 ENCOUNTER — Other Ambulatory Visit: Payer: PPO

## 2021-10-21 DIAGNOSIS — C50211 Malignant neoplasm of upper-inner quadrant of right female breast: Secondary | ICD-10-CM | POA: Insufficient documentation

## 2021-10-21 DIAGNOSIS — Z171 Estrogen receptor negative status [ER-]: Secondary | ICD-10-CM | POA: Diagnosis not present

## 2021-10-21 DIAGNOSIS — N6489 Other specified disorders of breast: Secondary | ICD-10-CM | POA: Diagnosis not present

## 2021-10-21 DIAGNOSIS — R221 Localized swelling, mass and lump, neck: Secondary | ICD-10-CM | POA: Diagnosis not present

## 2021-10-21 DIAGNOSIS — J984 Other disorders of lung: Secondary | ICD-10-CM | POA: Diagnosis not present

## 2021-10-21 DIAGNOSIS — Z853 Personal history of malignant neoplasm of breast: Secondary | ICD-10-CM | POA: Diagnosis not present

## 2021-10-21 DIAGNOSIS — K449 Diaphragmatic hernia without obstruction or gangrene: Secondary | ICD-10-CM | POA: Diagnosis not present

## 2021-10-21 DIAGNOSIS — M4319 Spondylolisthesis, multiple sites in spine: Secondary | ICD-10-CM | POA: Diagnosis not present

## 2021-10-21 DIAGNOSIS — R590 Localized enlarged lymph nodes: Secondary | ICD-10-CM | POA: Diagnosis not present

## 2021-10-21 MED ORDER — IOHEXOL 300 MG/ML  SOLN
100.0000 mL | Freq: Once | INTRAMUSCULAR | Status: AC | PRN
  Administered 2021-10-21: 100 mL via INTRAVENOUS

## 2021-10-21 MED FILL — Dexamethasone Sodium Phosphate Inj 100 MG/10ML: INTRAMUSCULAR | Qty: 1 | Status: AC

## 2021-10-23 ENCOUNTER — Other Ambulatory Visit: Payer: Self-pay | Admitting: Internal Medicine

## 2021-10-24 ENCOUNTER — Ambulatory Visit: Payer: PPO

## 2021-10-24 ENCOUNTER — Inpatient Hospital Stay: Payer: PPO

## 2021-10-24 VITALS — BP 123/70 | HR 85 | Temp 97.3°F | Resp 18 | Wt 188.5 lb

## 2021-10-24 DIAGNOSIS — E113559 Type 2 diabetes mellitus with stable proliferative diabetic retinopathy, unspecified eye: Secondary | ICD-10-CM | POA: Diagnosis not present

## 2021-10-24 DIAGNOSIS — I10 Essential (primary) hypertension: Secondary | ICD-10-CM | POA: Diagnosis not present

## 2021-10-24 DIAGNOSIS — C50211 Malignant neoplasm of upper-inner quadrant of right female breast: Secondary | ICD-10-CM

## 2021-10-24 DIAGNOSIS — Z171 Estrogen receptor negative status [ER-]: Secondary | ICD-10-CM

## 2021-10-24 DIAGNOSIS — D4861 Neoplasm of uncertain behavior of right breast: Secondary | ICD-10-CM

## 2021-10-24 DIAGNOSIS — Z5111 Encounter for antineoplastic chemotherapy: Secondary | ICD-10-CM | POA: Diagnosis not present

## 2021-10-24 LAB — COMPREHENSIVE METABOLIC PANEL
ALT: 20 U/L (ref 0–44)
AST: 35 U/L (ref 15–41)
Albumin: 3.9 g/dL (ref 3.5–5.0)
Alkaline Phosphatase: 90 U/L (ref 38–126)
Anion gap: 8 (ref 5–15)
BUN: 16 mg/dL (ref 8–23)
CO2: 23 mmol/L (ref 22–32)
Calcium: 9.2 mg/dL (ref 8.9–10.3)
Chloride: 106 mmol/L (ref 98–111)
Creatinine, Ser: 0.84 mg/dL (ref 0.44–1.00)
GFR, Estimated: >60 mL/min (ref >60)
Glucose, Bld: 145 mg/dL — ABNORMAL HIGH (ref 70–99)
Potassium: 3.9 mmol/L (ref 3.5–5.1)
Sodium: 137 mmol/L (ref 135–145)
Total Bilirubin: 0.5 mg/dL (ref 0.3–1.2)
Total Protein: 7.3 g/dL (ref 6.5–8.1)

## 2021-10-24 LAB — CBC WITH DIFFERENTIAL/PLATELET
Abs Immature Granulocytes: 0.22 10*3/uL — ABNORMAL HIGH (ref 0.00–0.07)
Basophils Absolute: 0.1 10*3/uL (ref 0.0–0.1)
Basophils Relative: 1 %
Eosinophils Absolute: 0.1 10*3/uL (ref 0.0–0.5)
Eosinophils Relative: 2 %
HCT: 28.6 % — ABNORMAL LOW (ref 36.0–46.0)
Hemoglobin: 8.9 g/dL — ABNORMAL LOW (ref 12.0–15.0)
Immature Granulocytes: 6 %
Lymphocytes Relative: 24 %
Lymphs Abs: 0.9 10*3/uL (ref 0.7–4.0)
MCH: 26.7 pg (ref 26.0–34.0)
MCHC: 31.1 g/dL (ref 30.0–36.0)
MCV: 85.9 fL (ref 80.0–100.0)
Monocytes Absolute: 0.4 10*3/uL (ref 0.1–1.0)
Monocytes Relative: 10 %
Neutro Abs: 2.1 10*3/uL (ref 1.7–7.7)
Neutrophils Relative %: 57 %
Platelets: 182 10*3/uL (ref 150–400)
RBC: 3.33 MIL/uL — ABNORMAL LOW (ref 3.87–5.11)
RDW: 18.3 % — ABNORMAL HIGH (ref 11.5–15.5)
Smear Review: NORMAL
WBC: 3.8 10*3/uL — ABNORMAL LOW (ref 4.0–10.5)
nRBC: 0.5 % — ABNORMAL HIGH (ref 0.0–0.2)

## 2021-10-24 MED ORDER — SODIUM CHLORIDE 0.9 % IV SOLN
10.0000 mg | Freq: Once | INTRAVENOUS | Status: AC
  Administered 2021-10-24: 10 mg via INTRAVENOUS
  Filled 2021-10-24: qty 10

## 2021-10-24 MED ORDER — DIPHENHYDRAMINE HCL 50 MG/ML IJ SOLN
50.0000 mg | Freq: Once | INTRAMUSCULAR | Status: AC
  Administered 2021-10-24: 50 mg via INTRAVENOUS
  Filled 2021-10-24: qty 1

## 2021-10-24 MED ORDER — SODIUM CHLORIDE 0.9 % IV SOLN
200.0000 mg | Freq: Once | INTRAVENOUS | Status: AC
  Administered 2021-10-24: 200 mg via INTRAVENOUS
  Filled 2021-10-24: qty 8

## 2021-10-24 MED ORDER — HEPARIN SOD (PORK) LOCK FLUSH 100 UNIT/ML IV SOLN
500.0000 [IU] | Freq: Once | INTRAVENOUS | Status: AC
  Administered 2021-10-24: 500 [IU] via INTRAVENOUS
  Filled 2021-10-24: qty 5

## 2021-10-24 MED ORDER — FAMOTIDINE IN NACL 20-0.9 MG/50ML-% IV SOLN
20.0000 mg | Freq: Once | INTRAVENOUS | Status: AC
  Administered 2021-10-24: 20 mg via INTRAVENOUS
  Filled 2021-10-24: qty 50

## 2021-10-24 MED ORDER — SODIUM CHLORIDE 0.9% FLUSH
10.0000 mL | Freq: Once | INTRAVENOUS | Status: AC
  Administered 2021-10-24: 10 mL via INTRAVENOUS
  Filled 2021-10-24: qty 10

## 2021-10-24 MED ORDER — SODIUM CHLORIDE 0.9 % IV SOLN
200.0000 mg | Freq: Once | INTRAVENOUS | Status: AC
  Administered 2021-10-24: 200 mg via INTRAVENOUS
  Filled 2021-10-24: qty 10

## 2021-10-24 MED ORDER — SODIUM CHLORIDE 0.9 % IV SOLN
60.0000 mg/m2 | Freq: Once | INTRAVENOUS | Status: AC
  Administered 2021-10-24: 120 mg via INTRAVENOUS
  Filled 2021-10-24: qty 20

## 2021-10-24 MED ORDER — SODIUM CHLORIDE 0.9 % IV SOLN
Freq: Once | INTRAVENOUS | Status: AC
  Filled 2021-10-24: qty 250

## 2021-10-24 NOTE — Progress Notes (Signed)
Per Beckey Rutter, NP add Venofer to treatment today.

## 2021-10-24 NOTE — Patient Instructions (Signed)
[facility] AT [place]  Discharge Instructions: Thank you for choosing [facility] to provide your oncology and hematology care.  If you have a lab appointment with the [facility], please go directly to the [facility] and check in at the registration area.  Wear comfortable clothing and clothing appropriate for easy access to any Portacath or PICC line.   We strive to give you quality time with your provider. You may need to reschedule your appointment if you arrive late (15 or more minutes).  Arriving late affects you and other patients whose appointments are after yours.  Also, if you miss three or more appointments without notifying the office, you may be dismissed from the clinic at the provider's discretion.      For prescription refill requests, have your pharmacy contact our office and allow 72 hours for refills to be completed.    Today you received the following chemotherapy and/or immunotherapy agents Keytruda & Taxol.       To help prevent nausea and vomiting after your treatment, we encourage you to take your nausea medication as directed.  BELOW ARE SYMPTOMS THAT SHOULD BE REPORTED IMMEDIATELY: *FEVER GREATER THAN 100.4 F (38 C) OR HIGHER *CHILLS OR SWEATING *NAUSEA AND VOMITING THAT IS NOT CONTROLLED WITH YOUR NAUSEA MEDICATION *UNUSUAL SHORTNESS OF BREATH *UNUSUAL BRUISING OR BLEEDING *URINARY PROBLEMS (pain or burning when urinating, or frequent urination) *BOWEL PROBLEMS (unusual diarrhea, constipation, pain near the anus) TENDERNESS IN MOUTH AND THROAT WITH OR WITHOUT PRESENCE OF ULCERS (sore throat, sores in mouth, or a toothache) UNUSUAL RASH, SWELLING OR PAIN  UNUSUAL VAGINAL DISCHARGE OR ITCHING   Items with * indicate a potential emergency and should be followed up as soon as possible or go to the Emergency Department if any problems should occur.  Please show the CHEMOTHERAPY ALERT CARD or IMMUNOTHERAPY ALERT CARD at  check-in to the Emergency Department and triage nurse.  Should you have questions after your visit or need to cancel or reschedule your appointment, please contact [facility] CANCER [place] AT [place]  [phone] and follow the prompts.  Office hours are 8:00 a.m. to 4:30 p.m. Monday - Friday. Please note that voicemails left after 4:00 p.m. may not be returned until the following business day.  We are closed weekends and major holidays. You have access to a nurse at all times for urgent questions. Please call the main number to the clinic [phone] and follow the prompts.  For any non-urgent questions, you may also contact your provider using MyChart. We now offer e-Visits for anyone 59 and older to request care online for non-urgent symptoms. For details visit [email or web address].   Also download the MyChart app! Go to the app store, search "MyChart", open the app, select [facility], and log in with your MyChart username and password.  Masks are optional in the cancer centers. If you would like for your care team to wear a mask while they are taking care of you, please let them know. For doctor visits, patients may have with them one support person who is at least 69 years old. At this time, visitors are not allowed in the infusion area.

## 2021-10-25 ENCOUNTER — Telehealth: Payer: Self-pay

## 2021-10-25 ENCOUNTER — Encounter: Payer: Self-pay | Admitting: Internal Medicine

## 2021-10-25 DIAGNOSIS — I10 Essential (primary) hypertension: Secondary | ICD-10-CM

## 2021-10-25 DIAGNOSIS — E113559 Type 2 diabetes mellitus with stable proliferative diabetic retinopathy, unspecified eye: Secondary | ICD-10-CM

## 2021-10-25 DIAGNOSIS — E785 Hyperlipidemia, unspecified: Secondary | ICD-10-CM

## 2021-10-25 NOTE — Telephone Encounter (Signed)
Labs have been ordered and appt has been scheduled. Pt is aware of appt date and time.

## 2021-10-25 NOTE — Telephone Encounter (Signed)
Patient states she would like to follow-up with Dr. Deborra Medina regarding her diabetes.  Patient states she would like to have her A1c checked and her feet checked.  I have scheduled patient to see Dr. Derrel Nip on 11/22/2021, but we need to have a lab order to check her A1c in order to schedule the lab visit.

## 2021-10-28 ENCOUNTER — Other Ambulatory Visit: Payer: Self-pay | Admitting: Internal Medicine

## 2021-10-28 DIAGNOSIS — Z171 Estrogen receptor negative status [ER-]: Secondary | ICD-10-CM

## 2021-10-28 MED FILL — Dexamethasone Sodium Phosphate Inj 100 MG/10ML: INTRAMUSCULAR | Qty: 1 | Status: AC

## 2021-10-28 NOTE — Telephone Encounter (Signed)
Component Ref Range & Units 4 d ago (10/24/21) 11 d ago (10/17/21) 3 wk ago (10/03/21) 1 mo ago (09/26/21) 1 mo ago (09/19/21) 1 mo ago (09/12/21) 1 mo ago (09/05/21)  Potassium 3.5 - 5.1 mmol/L 3.9  3.7  3.8  3.6  4.0  3.9  4.0

## 2021-10-31 ENCOUNTER — Encounter: Payer: Self-pay | Admitting: Internal Medicine

## 2021-10-31 ENCOUNTER — Ambulatory Visit: Payer: PPO

## 2021-10-31 ENCOUNTER — Inpatient Hospital Stay: Payer: PPO | Attending: Internal Medicine

## 2021-10-31 ENCOUNTER — Inpatient Hospital Stay (HOSPITAL_BASED_OUTPATIENT_CLINIC_OR_DEPARTMENT_OTHER): Payer: PPO | Admitting: Internal Medicine

## 2021-10-31 ENCOUNTER — Inpatient Hospital Stay: Payer: PPO

## 2021-10-31 VITALS — BP 136/60 | HR 96

## 2021-10-31 DIAGNOSIS — E876 Hypokalemia: Secondary | ICD-10-CM | POA: Diagnosis not present

## 2021-10-31 DIAGNOSIS — C50211 Malignant neoplasm of upper-inner quadrant of right female breast: Secondary | ICD-10-CM

## 2021-10-31 DIAGNOSIS — C7951 Secondary malignant neoplasm of bone: Secondary | ICD-10-CM | POA: Diagnosis not present

## 2021-10-31 DIAGNOSIS — E032 Hypothyroidism due to medicaments and other exogenous substances: Secondary | ICD-10-CM | POA: Diagnosis not present

## 2021-10-31 DIAGNOSIS — Z171 Estrogen receptor negative status [ER-]: Secondary | ICD-10-CM

## 2021-10-31 DIAGNOSIS — Z5111 Encounter for antineoplastic chemotherapy: Secondary | ICD-10-CM | POA: Insufficient documentation

## 2021-10-31 DIAGNOSIS — D509 Iron deficiency anemia, unspecified: Secondary | ICD-10-CM | POA: Insufficient documentation

## 2021-10-31 DIAGNOSIS — E1165 Type 2 diabetes mellitus with hyperglycemia: Secondary | ICD-10-CM | POA: Diagnosis not present

## 2021-10-31 DIAGNOSIS — G620 Drug-induced polyneuropathy: Secondary | ICD-10-CM | POA: Insufficient documentation

## 2021-10-31 DIAGNOSIS — D4861 Neoplasm of uncertain behavior of right breast: Secondary | ICD-10-CM

## 2021-10-31 DIAGNOSIS — Z5112 Encounter for antineoplastic immunotherapy: Secondary | ICD-10-CM | POA: Diagnosis not present

## 2021-10-31 DIAGNOSIS — K7460 Unspecified cirrhosis of liver: Secondary | ICD-10-CM | POA: Insufficient documentation

## 2021-10-31 DIAGNOSIS — Z79890 Hormone replacement therapy (postmenopausal): Secondary | ICD-10-CM | POA: Insufficient documentation

## 2021-10-31 DIAGNOSIS — C792 Secondary malignant neoplasm of skin: Secondary | ICD-10-CM | POA: Insufficient documentation

## 2021-10-31 LAB — CBC WITH DIFFERENTIAL/PLATELET
Abs Immature Granulocytes: 0.05 10*3/uL (ref 0.00–0.07)
Basophils Absolute: 0.1 10*3/uL (ref 0.0–0.1)
Basophils Relative: 1 %
Eosinophils Absolute: 0.1 10*3/uL (ref 0.0–0.5)
Eosinophils Relative: 2 %
HCT: 28.2 % — ABNORMAL LOW (ref 36.0–46.0)
Hemoglobin: 8.9 g/dL — ABNORMAL LOW (ref 12.0–15.0)
Immature Granulocytes: 1 %
Lymphocytes Relative: 23 %
Lymphs Abs: 0.8 10*3/uL (ref 0.7–4.0)
MCH: 27.1 pg (ref 26.0–34.0)
MCHC: 31.6 g/dL (ref 30.0–36.0)
MCV: 86 fL (ref 80.0–100.0)
Monocytes Absolute: 0.3 10*3/uL (ref 0.1–1.0)
Monocytes Relative: 9 %
Neutro Abs: 2.2 10*3/uL (ref 1.7–7.7)
Neutrophils Relative %: 64 %
Platelets: 215 10*3/uL (ref 150–400)
RBC: 3.28 MIL/uL — ABNORMAL LOW (ref 3.87–5.11)
RDW: 19.6 % — ABNORMAL HIGH (ref 11.5–15.5)
WBC: 3.6 10*3/uL — ABNORMAL LOW (ref 4.0–10.5)
nRBC: 0 % (ref 0.0–0.2)

## 2021-10-31 LAB — COMPREHENSIVE METABOLIC PANEL
ALT: 23 U/L (ref 0–44)
AST: 44 U/L — ABNORMAL HIGH (ref 15–41)
Albumin: 3.8 g/dL (ref 3.5–5.0)
Alkaline Phosphatase: 92 U/L (ref 38–126)
Anion gap: 9 (ref 5–15)
BUN: 13 mg/dL (ref 8–23)
CO2: 25 mmol/L (ref 22–32)
Calcium: 9.3 mg/dL (ref 8.9–10.3)
Chloride: 104 mmol/L (ref 98–111)
Creatinine, Ser: 0.95 mg/dL (ref 0.44–1.00)
GFR, Estimated: >60 mL/min (ref >60)
Glucose, Bld: 128 mg/dL — ABNORMAL HIGH (ref 70–99)
Potassium: 4 mmol/L (ref 3.5–5.1)
Sodium: 138 mmol/L (ref 135–145)
Total Bilirubin: 0.6 mg/dL (ref 0.3–1.2)
Total Protein: 7.4 g/dL (ref 6.5–8.1)

## 2021-10-31 MED ORDER — HEPARIN SOD (PORK) LOCK FLUSH 100 UNIT/ML IV SOLN
500.0000 [IU] | Freq: Once | INTRAVENOUS | Status: AC | PRN
  Administered 2021-10-31: 500 [IU]
  Filled 2021-10-31: qty 5

## 2021-10-31 MED ORDER — SODIUM CHLORIDE 0.9 % IV SOLN
200.0000 mg | Freq: Once | INTRAVENOUS | Status: AC
  Administered 2021-10-31: 200 mg via INTRAVENOUS
  Filled 2021-10-31: qty 200

## 2021-10-31 MED ORDER — FAMOTIDINE IN NACL 20-0.9 MG/50ML-% IV SOLN
20.0000 mg | Freq: Once | INTRAVENOUS | Status: AC
  Administered 2021-10-31: 20 mg via INTRAVENOUS
  Filled 2021-10-31: qty 50

## 2021-10-31 MED ORDER — DIPHENHYDRAMINE HCL 50 MG/ML IJ SOLN
50.0000 mg | Freq: Once | INTRAMUSCULAR | Status: AC
  Administered 2021-10-31: 50 mg via INTRAVENOUS
  Filled 2021-10-31: qty 1

## 2021-10-31 MED ORDER — SODIUM CHLORIDE 0.9 % IV SOLN
60.0000 mg/m2 | Freq: Once | INTRAVENOUS | Status: AC
  Administered 2021-10-31: 120 mg via INTRAVENOUS
  Filled 2021-10-31: qty 20

## 2021-10-31 MED ORDER — SODIUM CHLORIDE 0.9 % IV SOLN
Freq: Once | INTRAVENOUS | Status: AC
  Filled 2021-10-31: qty 250

## 2021-10-31 MED ORDER — SODIUM CHLORIDE 0.9 % IV SOLN
10.0000 mg | Freq: Once | INTRAVENOUS | Status: AC
  Administered 2021-10-31: 10 mg via INTRAVENOUS
  Filled 2021-10-31: qty 10

## 2021-10-31 NOTE — Progress Notes (Signed)
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 Byrnett.] [id number] Carbo-taxol-AC;  JAN 2022-s/p Lumpectomy & ALND- [ypT1a (17m ) ypN1 (2/11 LN-positive)]; s/p radiation end of April 2022.  #Early June 2022-Xeloda 1000 mg per metered square [2000] 2 weeks on 1 week of 6-8 cycles; July 11th 2022- STARTING cycle #3-cut down the dose to 1500 mg BID[sec to HFS].   # JAN 2023- A. SKIN, RIGHT BREAST; PUNCH BIOPSY: [Dr.Byrnett] - INVASIVE CARCINOMA WITH DERMAL INFILTRATION AND LYMPHATIC INVASION. ER- weak POSISTIVE [10%]; PR/her2 NEG [[facility] 0]; JAN 20th, 2023- IMPRESSION: 1. Although the hypermetabolic right axillary node has resolved since the prior PET, there is a new hypermetabolic right low cervical/supraclavicular node, suspicious for nodal metastasis. 2. Decrease in right breast hypermetabolism with nonspecific overlying skin thickening in the setting of prior lumpectomy. 3. Low-level hypermetabolism within the T2 vertebral body, suspicious for isolated osseous metastasis. This could be confirmed with pre and postcontrast thoracic spine MRI. 4. Hypermetabolic thoracic nodes, slightly progressive. Given progression, nodal metastasis are slightly favored over reactive etiologies. 5. Incidental findings, including: Aortic Atherosclerosis (ICD10-I70.0). Tiny hiatal hernia. Hepatic steatosis with nonspecific caudate lobe enlargemen  Comment:  The carcinoma is  morphologically similar to the post-neoadjuvant mammary  carcinoma seen in the January 2022 right breast wide excision and lymph  Nodes  # FEB 6th-Taxol single agent #1; FEB 13th, 2023- [CPS =15]; Taxol-Keytruda  MAY 2023- CT CHEST:  Since the PET of 04/15/2021, enlargement of the previously described hypermetabolic right low cervical/supraclavicular nodal metastasis. Foci of hyperenhancement within the right breast are new or more well-defined since 02/02/2021, suspicious for progressive residual/recurrent disease. Nonspecific mediastinal nodes, similar to slightly decreased in size;  New sclerosis at the site of a known T2 vertebral body metastasis.   # s/p palliative radiation therapy to her right supraclavicular nodal metastasis as well as T2 vertebral body [30 Gray in 10 fractions ;[5/31 to  6/13].  CONTINUED TAXOL-KEYTRUDA   DIAGNOSIS: Right breast cancer triple negative      Carcinoma of upper-inner quadrant of right breast in female, estrogen receptor negative ([place]  10/07/2019 Initial Diagnosis   Carcinoma of upper-inner quadrant of right breast in female, estrogen receptor negative ([place]   12/04/2019 Genetic Testing   Negative genetic testing. No pathogenic variants identified on the [facility] Common Hereditary Cancers Panel + Skin Cancers Panel. The report date is 12/04/2019.  The Common Hereditary Cancers Panel + Skin Cancers Panel offered by [facility] includes sequencing and/or deletion duplication testing of the following 54 genes: APC*, ATM*, AXIN2, BAP1, BARD1, BMPR1A, BRCA1, BRCA2, BRIP1, CDH1, CDK4, CDKN2A (p14ARF), CDKN2A (p16INK4a), CHEK2, CTNNA1, DICER1*, EPCAM*, GREM1*, HOXB13, KIT, MEN1*, MITF*, MLH1*, MSH2*, MSH3*, MSH6*, MUTYH, NBN, NF1*, NTHL1, PALB2, PDGFRA, PMS2*, POLD1*, POLE, POT1, PTCH1, PTEN*, RAD50, RAD51C, RAD51D, RB1*, RNF43, SDHA*, SDHB, SDHC*, SDHD, SMAD4, SMARCA4, STK11, SUFU, TP53, TSC1*, TSC2, VHL.    05/07/2020 Cancer Staging   Staging form: Breast, AJCC 8th  Edition - Clinical: Stage IIIB (cT2, cN1, cM0, G3, ER-, PR-, HER2-) - Signed by BCammie Sickle MD on 05/07/2020  04/18/2021 Cancer Staging   Staging form: Breast, AJCC 8th Edition - Pathologic: Stage IV (pT4d, pN3c, cM1, ER+, PR-, HER2-) - Signed by Cammie Sickle, MD on 04/18/2021 Nuclear grade: G3      HISTORY OF PRESENTING ILLNESS: Accompanied by sister.   Walking independently.  Ashlee Cox 71 y.o.  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  In the interim patient is currently status post palliative radiation to the right supraclavicular region; also to the T2 vertebral body.  Patient feels her right neck lymph node is slightly smaller.  She continues with Synthroid for her iatrogenic hypothyroidism.  She continues to be compliant with her Synthroid.   Patient denies any diarrhea.  No nausea vomiting. Patient complains of mild tingling and numbness in extremities. No falls.  Patient admits to resolution of the breast skin rash.  Complains of mild to moderate fatigue.   Review of Systems  Constitutional:  Positive for malaise/fatigue. Negative for chills, diaphoresis, fever and weight loss.  HENT:  Negative for nosebleeds and sore throat.   Eyes:  Negative for double vision.  Respiratory:  Negative for cough, hemoptysis, sputum production and wheezing.   Cardiovascular:  Negative for chest pain, palpitations, orthopnea and leg swelling.  Gastrointestinal:  Negative for abdominal pain, blood in stool, constipation and melena.  Genitourinary:  Negative for dysuria, frequency and urgency.  Musculoskeletal:  Positive for back pain and neck pain. Negative for joint pain.  Neurological:  Positive for tingling. Negative for dizziness, focal weakness, weakness and headaches.  Endo/Heme/Allergies:  Does not bruise/bleed easily.   Psychiatric/Behavioral:  Negative for depression. The patient is not nervous/anxious and does not have insomnia.      MEDICAL HISTORY:  Past Medical History:  Diagnosis Date   Cancer [facility]) 2021   right breast   Cyst, breast    benign   Diabetes mellitus without complication ([place])    Family history of melanoma    Family history of ovarian cancer    GERD (gastroesophageal reflux disease)    H/O: rheumatic fever    History of colonoscopy 2010   done bc of bleeding,  normal , due back in 5 yrs ([place])   Hyperlipidemia    Hypertension    Menopause    at age 11   Personal history of chemotherapy 09/2019   RIGHT  INVASIVE MAMMARY CARCINOMA   Personal history of radiation therapy     SURGICAL HISTORY: Past Surgical History:  Procedure Laterality Date   APPENDECTOMY  2006   BREAST BIOPSY Right 09/22/2019   INVASIVE MAMMARY CARCINOMA   BREAST CYST ASPIRATION Left 1999   BREAST LUMPECTOMY Left 04/23/2020   surgery with NL and SN    BREAST LUMPECTOMY WITH NEEDLE LOCALIZATION Right 04/23/2020   Procedure: BREAST LUMPECTOMY WITH NEEDLE LOCALIZATION;  Surgeon: Robert Bellow, MD;  Location: [facility] ORS;  Service: General;  Laterality: Right;   BREAST LUMPECTOMY WITH SENTINEL LYMPH NODE BIOPSY Right 04/23/2020   Procedure: BREAST LUMPECTOMY WITH SENTINEL LYMPH NODE BX;  Surgeon: Robert Bellow, MD;  Location: [facility];  Service: General;  Laterality: Right;   CHOLECYSTECTOMY  2006   COLONOSCOPY     PORTACATH PLACEMENT Left 10/06/2019   Procedure: INSERTION PORT-A-CATH;  Surgeon: Robert Bellow, MD;  Location: [facility] ORS;  Service: General;  Laterality: Left;   SUBMUCOSAL TATTOO INJECTION Right 10/06/2019   Procedure: Right axillary TATTOO  INJECTION;  Surgeon: Robert Bellow, MD;  Location: [facility] ORS;  Service: General;  Laterality: Right;   VAGINAL DELIVERY     x3    SOCIAL HISTORY: Social History   Socioeconomic History   Marital status: Married    Spouse name:  Chrissie Noa   Number of children: 3   Years of education: Not on file   Highest education level: Not on file  Occupational History   Occupation: Glass blower/designer: [employer]  Tobacco Use   Smoking status: Former    Years: 1.00    Types: Cigarettes    Quit date: 12/26/2005    Years since quitting: 15.8   Smokeless tobacco: Never   Tobacco comments:    smoked for less than 1 years,  1 cig/day  Vaping Use   Vaping Use: Never used  Substance and Sexual Activity   Alcohol use: No   Drug use: No   Sexual activity: Never    Partners: Female  Other Topics Concern   Not on file  Social History Narrative   Widowed, March 2014; remarried.      [employer] ret[employer]; quit smoking 1ppw; no alcohol.       Social Determinants of Health   Financial Resource Strain: Medium Risk (05/25/2021)   Overall Financial Resource Strain (CARDIA)    Difficulty of Paying Living Expenses: Somewhat hard  Food Insecurity: No Food Insecurity (05/18/2021)   Hunger Vital Sign    Worried About Running Out of Food in the Last Year: Never true    Ran Out of Food in the Last Year: Never true  Transportation Needs: No Transportation Needs (05/18/2021)   PRAPARE - Hydrologist (Medical): No    Lack of Transportation (Non-Medical): No  Physical Activity: Inactive (05/18/2021)   Exercise Vital Sign    Days of Exercise per Week: 0 days    Minutes of Exercise per Session: 0 min  Stress: No Stress Concern Present (05/18/2021)   [place]    Feeling of Stress : Not at all  Social Connections: [place] (05/18/2021)   Social Connection and Isolation Panel [NHANES]    Frequency of Communication with Friends and Family: More than three times a week    Frequency of Social Gatherings with Friends and Family: More than three times a week    Attends Religious Services: More than 4 times per year    Active Member of [employer] or  Organizations: Yes    Attends Music therapist: More than 4 times per year    Marital Status: Married  Human resources officer Violence: Not At Risk (05/04/2021)   Humiliation, Afraid, Rape, and Kick questionnaire    Fear of Current or Ex-Partner: No    Emotionally Abused: No    Physically Abused: No    Sexually Abused: No    FAMILY HISTORY: Family History  Problem Relation Age of Onset   Cancer Mother 69       ovarian- died 4-5 years.    Heart disease Mother 51   Diabetes Mother    Stroke Father 33   Diabetes Father    Diabetes Sister    Melanoma Sister        survived   Breast cancer Neg Hx     ALLERGIES:  has No Known Allergies.  MEDICATIONS:  Current Outpatient Medications  Medication Sig Dispense Refill   acetaminophen (TYLENOL) 500 MG tablet Take 500 mg by mouth  every 8 (eight) hours as needed for moderate pain.     calcium carbonate (OS-CAL) 600 MG TABS tablet Take 600 mg by mouth daily.     Continuous Blood Gluc Sensor (FREESTYLE LIBRE 3 SENSOR) MISC Apply 1 each topically every 14 (fourteen) days. Place 1 sensor on the skin every 14 days. Use to check glucose continuously 6 each 3   diphenhydrAMINE HCl, Sleep, (ZZZQUIL PO) Take 1 tablet by mouth at bedtime as needed.     gabapentin (NEURONTIN) 100 MG capsule TAKE 2 CAPSULES BY MOUTH AT BEDTIME 180 capsule 0   glipiZIDE (GLUCOTROL XL) 10 MG 24 hr tablet Take 1 tablet by mouth once daily with breakfast 90 tablet 0   KLOR-CON M20 20 MEQ tablet Take 1 tablet by mouth twice daily 60 tablet 0   levothyroxine (SYNTHROID) 100 MCG tablet Take 1 tablet (100 mcg total) by mouth daily before breakfast. 30 tablet 3   lidocaine-prilocaine (EMLA) cream Apply 1 application topically as needed. Apply to port and cover with saran wrap 1-2 hours prior to port access 30 g 1   losartan-hydrochlorothiazide (HYZAAR) 50-12.5 MG tablet Take 1 tablet by mouth once daily 90 tablet 0   lovastatin (MEVACOR) 40 MG tablet TAKE 1 TABLET BY  MOUTH AT BEDTIME 90 tablet 1   magnesium oxide (MAG-OX) 400 (240 Mg) MG tablet Take 400 mg by mouth daily.     metFORMIN (GLUCOPHAGE) 1000 MG tablet TAKE 2 TABLETS BY MOUTH ONCE DAILY WITH BREAKFAST 180 tablet 0   Multiple Vitamin (MULTIVITAMIN) tablet Take 1 tablet by mouth daily.     Multiple Vitamins-Minerals (MULTI-VITAMIN GUMMIES) CHEW Chew 1 tablet by mouth 1 day or 1 dose.     omeprazole (PRILOSEC) 20 MG capsule Take 1 capsule by mouth once daily 90 capsule 0   Semaglutide,0.25 or 0.5MG/DOS, (OZEMPIC, 0.25 OR 0.5 MG/DOSE,) 2 MG/1.5ML SOPN Inject 0.5 mg into the skin once a week. 1.5 mL 11   vitamin B-12 (CYANOCOBALAMIN) 100 MCG tablet Take 100 mcg by mouth daily.     No current facility-administered medications for this visit.   Facility-Administered Medications Ordered in Other Visits  Medication Dose Route Frequency Provider Last Rate Last Admin   heparin lock flush 100 unit/mL  500 Units Intracatheter Once PRN Cammie Sickle, MD       iron sucrose (VENOFER) 200 mg in sodium chloride 0.9 % 100 mL IVPB  200 mg Intravenous Once Verlon Au, NP       PACLitaxel (TAXOL) 120 mg in sodium chloride 0.9 % 250 mL chemo infusion (</= 24m/m2)  60 mg/m2 (Treatment Plan Recorded) Intravenous Once BCammie Sickle MD 270 mL/hr at 10/31/21 1112 120 mg at 10/31/21 1112      .  PHYSICAL EXAMINATION: ECOG PERFORMANCE STATUS: 0 - Asymptomatic  Vitals:   10/31/21 0838  BP: 120/80  Pulse: 91  Temp: 97.6 F (36.4 C)  SpO2: 100%    Filed Weights   10/31/21 0838  Weight: 187 lb 3.2 oz (84.9 kg)    Right supraclavicular lymphadenopathy felt. 2-3 cm in size.   Physical Exam HENT:     Head: Normocephalic and atraumatic.     Mouth/Throat:     Pharynx: No oropharyngeal exudate.  Eyes:     Pupils: Pupils are equal, round, and reactive to light.  Cardiovascular:     Rate and Rhythm: Normal rate and regular rhythm.  Pulmonary:     Effort: Pulmonary effort is normal. No  respiratory distress.  Breath sounds: Normal breath sounds. No wheezing.  Abdominal:     General: Bowel sounds are normal. There is no distension.     Palpations: Abdomen is soft. There is no mass.     Tenderness: There is no abdominal tenderness. There is no guarding or rebound.  Musculoskeletal:        General: No tenderness. Normal range of motion.     Cervical back: Normal range of motion and neck supple.  Skin:    General: Skin is warm.  Neurological:     Mental Status: She is alert and oriented to person, place, and time.  Psychiatric:        Mood and Affect: Affect normal.      LABORATORY DATA:  I have reviewed the data as listed Lab Results  Component Value Date   WBC 3.6 (L) 10/31/2021   HGB 8.9 (L) 10/31/2021   HCT 28.2 (L) 10/31/2021   MCV 86.0 10/31/2021   PLT 215 10/31/2021   Recent Labs    10/17/21 0820 10/24/21 0805 10/31/21 0827  NA 138 137 138  K 3.7 3.9 4.0  CL 104 106 104  CO2 22 23 25   GLUCOSE 171* 145* 128*  BUN 13 16 13   CREATININE 0.75 0.84 0.95  CALCIUM 9.3 9.2 9.3  GFRNONAA >60 >60 >60  PROT 7.5 7.3 7.4  ALBUMIN 3.8 3.9 3.8  AST 31 35 44*  ALT 15 20 23   ALKPHOS 84 90 92  BILITOT 0.6 0.5 0.6    RADIOGRAPHIC STUDIES: I have personally reviewed the radiological images as listed and agreed with the findings in the report. CT SOFT TISSUE NECK W CONTRAST  Result Date: 10/21/2021 CLINICAL DATA:  Neck mass on right side.  History of breast cancer. EXAM: CT NECK WITH CONTRAST TECHNIQUE: Multidetector CT imaging of the neck was performed using the standard protocol following the bolus administration of intravenous contrast. RADIATION DOSE REDUCTION: This exam was performed according to the departmental dose-optimization program which includes automated exposure control, adjustment of the mA and/or kV according to patient size and/or use of iterative reconstruction technique. CONTRAST:  138m OMNIPAQUE IOHEXOL 300 MG/ML  SOLN COMPARISON:  CT  neck 07/27/2018 FINDINGS: Pharynx and larynx: The nasal cavity and nasopharynx are unremarkable. The oral cavity and oropharynx are unremarkable. The hypopharynx and larynx are unremarkable. The vocal folds are normal in appearance. There is no retropharyngeal fluid collection. Salivary glands: The parotid and submandibular glands are unremarkable. Thyroid: Unremarkable. Lymph nodes: The right level V lymph node measuring 2.0 cm x 2.4 cm x 1.8 cm is increased in size in the AP dimension, previously measuring 2.4 cm x 1.3 cm by 1.7 cm. There is no other pathologic lymphadenopathy in the neck. Vascular: The major vasculature of the neck is unremarkable. A left chest wall port is partially imaged. Limited intracranial: The imaged portions of the intracranial compartment are unremarkable. Visualized orbits: Globes and orbits are unremarkable. Mastoids and visualized paranasal sinuses: The paranasal sinuses and mastoid air cells are clear. Skeleton: Again seen is the pathologic fracture in the T2 vertebral body with associated sclerotic metastatic disease, similar in appearance to the prior study. There is no new acute osseous abnormality or new suspicious osseous lesion. Upper chest: The lungs are assessed on the separately dictated CT chest. Other: None. IMPRESSION: Since 07/26/2021: 1. Slight interval increase in size of the right level V lymph node. No other pathologic lymphadenopathy in the neck. 2. Unchanged metastatic disease in the T2 vertebral body with  associated pathologic fracture. Electronically Signed   By: Valetta Mole M.D.   On: 10/21/2021 15:36   CT CHEST ABDOMEN PELVIS W CONTRAST  Result Date: 10/21/2021 CLINICAL DATA:  Restaging of breast cancer. * Tracking Code: BO * EXAM: CT CHEST, ABDOMEN, AND PELVIS WITH CONTRAST TECHNIQUE: Multidetector CT imaging of the chest, abdomen and pelvis was performed following the standard protocol during bolus administration of intravenous contrast. RADIATION DOSE  REDUCTION: This exam was performed according to the departmental dose-optimization program which includes automated exposure control, adjustment of the mA and/or kV according to patient size and/or use of iterative reconstruction technique. CONTRAST:  120m OMNIPAQUE IOHEXOL 300 MG/ML  SOLN COMPARISON:  07/26/2021 FINDINGS: CT CHEST FINDINGS Cardiovascular: Left Port-A-Cath tip: Lower SVC. Atherosclerotic calcification of the aortic arch. Mediastinum/Nodes: Circumferential distal esophageal wall thickening. Small hiatal hernia. Lungs/Pleura: Biapical pleuroparenchymal scarring. No new or progressive lesion identified. Musculoskeletal: Stable sclerosis in the T2 vertebral body compatible with previous metastatic lesion. Continue cutaneous thickening in the right breast. Enhancing right lateral breast lesion 3.0 by 2.3 cm on image 28 series 4, previously 2.2 by 2.2 cm. Somewhat retroareolar enhancing lesion along the right breast glandular tissue 1.9 by 1.6 cm on image 24 series 4, previously 1.7 by 1.2 cm. Other smaller foci of nodularity along the right breast glandular tissues noted. CT ABDOMEN PELVIS FINDINGS Hepatobiliary: Caudate lobe burr prominence raises the possibility of early cirrhosis. Cholecystectomy. No focal liver lesion identified. Portosystemic shunting extending through the left hepatic lobe and beyond the diaphragm to the left internal mammary vein region. Pancreas: Unremarkable Spleen: Unremarkable Adrenals/Urinary Tract: Slight effacement of the right adrenal gland, possibly due to a retroperitoneal lipoma or less likely myelolipoma. Single small hypodense renal lesions are technically too small to characterize although statistically likely to be benign. Stomach/Bowel: Unremarkable Vascular/Lymphatic: Atherosclerosis is present, including aortoiliac atherosclerotic disease. No pathologic adenopathy. Reproductive: Unremarkable Other: Scarring or trace free fluid posterior to the right ovary.  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 Grade 1 degenerative anterolisthesis at L4-5. IMPRESSION: 1. Enlarging soft tissue lesions in the right breast, cannot exclude active breast malignancy. Stable cutaneous thickening in the right breast. 2. Stable sclerotic lesion in the T2 vertebral body likely a previous metastatic lesion. No change or progression. 3. Probable early cirrhosis with caudate lobe prominence and a dilated portal venous branch extending from the left hepatic lobe to the left internal mammary vein, suggesting portal venous hypertension. 4. Other imaging findings of potential clinical significance: Aortic Atherosclerosis (ICD10-I70.0). Small hiatal hernia. Circumferential distal esophageal wall thickening, esophagitis would be a common cause. 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 Stable scarring or trace free fluid posterior to the right ovary. Electronically Signed   By: WVan ClinesM.D.   On: 10/21/2021 14:12    ASSESSMENT & PLAN:   Carcinoma of upper-inner quadrant of right breast in female, estrogen receptor negative ([place] #Recurrent right breast cancer-s/p breast skin biopsy [FEB 2023- ER positive /PR HER2-0; however 2017-ER-NEGATIVE; -HER2 NEU-1+/LOW]; - recurrent- stage IV; NGS -FEB 2023- CPS =15%. Currently on Keytruda- taxol [2/13]; JULY 28th, 2023-  Enlarging soft tissue lesions in the right breast; Stable cutaneous thickening in the right breast;  Stable sclerotic lesion in the T2 vertebral body likely a previous metastatic lesion. JULY 2023- CT neck-  The right level V lymph node measuring 2.0 cm x 2.4 cm x 1.8 cm is increased in size in the AP  dimension, previously measuring 2.4 cm x 1.3 cm by 1.7 cm. There is no other pathologic lymphadenopathy in the neck.   #I discussed with patient about results of the CT scan-increase growth in the breast lesion; and also neck lymphadenopathy; however no  evidence of any new disease noted.  I discussed the option of switching to alternative options- like saciztuzumab govitecan.  However since-clinically stable/tolerating current therapy fairly well we will continue current therapy.   # Proceed with Taxol chemotherapy today- at 70 mg.m2. Labs today reviewed;  acceptable for treatment today.  Patient s/p palliative radiation therapy to her right supraclavicular nodal metastasis as well as T2 vertebral body [30 Gray in 10 fractions ;[5/31 to  6/13].  I reviewed the findings of the CT scan as above.  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  # T2 thoracic metastatic disease [confirmed MRI Jan 2023]-currently s/p Radiation [ 6/13-last] S/p  Zometa q4 W- STABLE.   # venofer   # HYPO [place]emia potassium 3.2 continue K-Dur 20 mg twice a day recommend inreasing fluid intake.-STABLE today 4.0.   # PN-1-2 sec to taxol-on Neurontin 200 qhs-STABLE.   # DM/poorly controlled- continue metformin 2000 mg q AM [Dr.Tullo]; on glipizide 10 mg XL. Continue follow up with  PCP.  On CGM.- FBG- 151 -STABLE  # Iron def anemia-[Iron studies- July 2023] continue Venofer-.    #Incidental findings on Imaging  C scan- JULY , 2023: Cirrhosis; atherosclerosis I reviewed/discussed/counseled the patient.   # Mediport: Functioning.   Key-3week; Taxol-weekx3;1 w-off; Zometa- 7/24  [q4w]- add to 8/21 visit..  *Add TSH # DISPOSITION:  # Taxol; Venofer- today;  # cancel 8/14 appt.  # Follow up 2 weeks; MD:  labs- cbc/cmp; Taxol; venofer  # Follow up 3  week--labs- cbc/cmp;TSH taxol-Keytruda- Dr.B  # I reviewed the blood work- with the patient in detail; also reviewed the imaging independently [as summarized above]; and with the patient in  detail.      All questions were answered. The patient/family knows to call the clinic with any problems, questions or concerns.   Cammie Sickle, MD 10/31/2021 11:36 AM

## 2021-10-31 NOTE — Patient Instructions (Signed)
[facility]  Discharge Instructions: Thank you for choosing [facility] to provide your oncology and hematology care.  If you have a lab appointment with the Cancer Center, please go directly to the Cancer Center and check in at the registration area.  Wear comfortable clothing and clothing appropriate for easy access to any Portacath or PICC line.   We strive to give you quality time with your provider. You may need to reschedule your appointment if you arrive late (15 or more minutes).  Arriving late affects you and other patients whose appointments are after yours.  Also, if you miss three or more appointments without notifying the office, you may be dismissed from the clinic at the provider's discretion.      For prescription refill requests, have your pharmacy contact our office and allow 72 hours for refills to be completed.    Today you received the following chemotherapy and/or immunotherapy agents Taxol       To help prevent nausea and vomiting after your treatment, we encourage you to take your nausea medication as directed.  BELOW ARE SYMPTOMS THAT SHOULD BE REPORTED IMMEDIATELY: *FEVER GREATER THAN 100.4 F (38 C) OR HIGHER *CHILLS OR SWEATING *NAUSEA AND VOMITING THAT IS NOT CONTROLLED WITH YOUR NAUSEA MEDICATION *UNUSUAL SHORTNESS OF BREATH *UNUSUAL BRUISING OR BLEEDING *URINARY PROBLEMS (pain or burning when urinating, or frequent urination) *BOWEL PROBLEMS (unusual diarrhea, constipation, pain near the anus) TENDERNESS IN MOUTH AND THROAT WITH OR WITHOUT PRESENCE OF ULCERS (sore throat, sores in mouth, or a toothache) UNUSUAL RASH, SWELLING OR PAIN  UNUSUAL VAGINAL DISCHARGE OR ITCHING   Items with * indicate a potential emergency and should be followed up as soon as possible or go to the Emergency Department if any problems should occur.  Please show the CHEMOTHERAPY ALERT CARD or IMMUNOTHERAPY ALERT CARD at check-in to the  Emergency Department and triage nurse.  Should you have questions after your visit or need to cancel or reschedule your appointment, please contact [facility]  [phone] and follow the prompts.  Office hours are 8:00 a.m. to 4:30 p.m. Monday - Friday. Please note that voicemails left after 4:00 p.m. may not be returned until the following business day.  We are closed weekends and major holidays. You have access to a nurse at all times for urgent questions. Please call the main number to the clinic [phone] and follow the prompts.  For any non-urgent questions, you may also contact your provider using MyChart. We now offer e-Visits for anyone 18 and older to request care online for non-urgent symptoms. For details visit [email or web address].   Also download the MyChart app! Go to the app store, search "MyChart", open the app, select [facility], and log in with your MyChart username and password.  Masks are optional in the cancer centers. If you would like for your care team to wear a mask while they are taking care of you, please let them know. For doctor visits, patients may have with them one support person who is at least 69 years old. At this time, visitors are not allowed in the infusion area.   

## 2021-10-31 NOTE — Assessment & Plan Note (Addendum)
#  Recurrent right breast cancer-s/p breast skin biopsy [FEB 2023- ER positive /PR HER2-0; however 2017-ER-NEGATIVE; -HER2 NEU-1+/LOW]; - recurrent- stage IV; NGS -FEB 2023- CPS =15%. Currently on Keytruda- taxol [2/13]; JULY 28th, 2023-  Enlarging soft tissue lesions in the right breast; Stable cutaneous thickening in the right breast;  Stable sclerotic lesion in the T2 vertebral body likely a previous metastatic lesion. JULY 2023- CT neck-  The right level V lymph node measuring 2.0 cm x 2.4 cm x 1.8 cm is increased in size in the AP dimension, previously measuring 2.4 cm x 1.3 cm by 1.7 cm. There is no other pathologic lymphadenopathy in the neck.   #I discussed with patient about results of the CT scan-increase growth in the breast lesion; and also neck lymphadenopathy; however no evidence of any new disease noted.  I discussed the option of switching to alternative options- like saciztuzumab govitecan.  However since-clinically stable/tolerating current therapy fairly well we will continue current therapy.   # Proceed with Taxol chemotherapy today- at 75 mg.m2. Labs today reviewed;  acceptable for treatment today.  Patient s/p palliative radiation therapy to her right supraclavicular nodal metastasis as well as T2 vertebral body [30 Gray in 10 fractions ;[5/31 to  6/13].  I reviewed the findings of the CT scan as above.  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  # T2 thoracic metastatic disease [confirmed MRI Jan 2023]-currently s/p Radiation [ 6/13-last] S/p  Zometa q4 W- STABLE.   # venofer   # HYPO Kalemia potassium 3.2 continue K-Dur 20 mg twice a day recommend inreasing fluid intake.-STABLE today 4.0.   # PN-1-2 sec to taxol-on Neurontin 200 qhs-STABLE.   #  DM/poorly controlled- continue metformin 2000 mg q AM [Dr.Tullo]; on glipizide 10 mg XL. Continue follow up with  PCP.  On CGM.- FBG- 151 -STABLE  # Iron def anemia-[Iron studies- July 2023] continue Venofer-.    #Incidental findings on Imaging  C scan- JULY , 2023: Cirrhosis; atherosclerosis I reviewed/discussed/counseled the patient.   # Mediport: Functioning.   Key-3week; Taxol-weekx3;1 w-off; Zometa- 7/24  [q4w]- add to 8/21 visit..  *Add TSH # DISPOSITION:  # Taxol; Venofer- today;  # cancel 8/14 appt.  # Follow up 2 weeks; MD:  labs- cbc/cmp; Taxol; venofer  # Follow up 3  week--labs- cbc/cmp;TSH taxol-Keytruda- Dr.B  # I reviewed the blood work- with the patient in detail; also reviewed the imaging independently [as summarized above]; and with the patient in detail.

## 2021-10-31 NOTE — Progress Notes (Signed)
Results of scan done 2 weeks ago.

## 2021-11-07 ENCOUNTER — Ambulatory Visit: Payer: PPO

## 2021-11-11 MED FILL — Dexamethasone Sodium Phosphate Inj 100 MG/10ML: INTRAMUSCULAR | Qty: 1 | Status: AC

## 2021-11-13 ENCOUNTER — Other Ambulatory Visit: Payer: Self-pay | Admitting: Internal Medicine

## 2021-11-13 DIAGNOSIS — D4861 Neoplasm of uncertain behavior of right breast: Secondary | ICD-10-CM

## 2021-11-13 NOTE — Progress Notes (Signed)
ON PATHWAY REGIMEN - Breast  No Change  Continue With Treatment as Ordered.  Original Decision Date/Time: 05/04/2021 16:41     Pembrolizumab: A cycle is every 21 days:     Pembrolizumab    Chemotherapy: A cycle is every 28 days:     Paclitaxel   **Always confirm dose/schedule in your pharmacy ordering system**  Patient Characteristics: Distant Metastases or Locoregional Recurrent Disease - Unresected or Locally Advanced Unresectable Disease Progressing after Neoadjuvant and Local Therapies, HER2 Low/Negative/Unknown, ER Negative/Unknown, Chemotherapy, HER2 Negative/Unknown, First Line,  ER Negative, PD-L1 Expression Positive by CPS ?10 Therapeutic Status: Distant Metastases HER2 Status: Negative (-) ER Status: Negative (-) PR Status: Negative (-) Therapy Approach Indicated: Standard Chemotherapy/Endocrine Therapy Line of Therapy: First Line PD-L1 Expression Status: PD-L1 Expression Positive by CPS ?10 Intent of Therapy: Non-Curative / Palliative Intent, Discussed with Patient

## 2021-11-13 NOTE — Assessment & Plan Note (Signed)
#  Recurrent right breast cancer-s/p breast skin biopsy [FEB 2023- ER positive /PR HER2-0; however 2017-ER-NEGATIVE; -HER2 NEU-1+/LOW]; - recurrent- stage IV; NGS -FEB 2023- CPS =15%. Currently on Keytruda- taxol [2/13]; JULY 28th, 2023-  Enlarging soft tissue lesions in the right breast; Stable cutaneous thickening in the right breast;  Stable sclerotic lesion in the T2 vertebral body likely a previous metastatic lesion. JULY 2023- CT neck-  The right level V lymph node measuring 2.0 cm x 2.4 cm x 1.8 cm is increased in size in the AP dimension, previously measuring 2.4 cm x 1.3 cm by 1.7 cm. There is no other pathologic lymphadenopathy in the neck.   #I discussed with patient about results of the CT scan-increase growth in the breast lesion; and also neck lymphadenopathy; however no evidence of any new disease noted.  I discussed the option of switching to alternative options- like saciztuzumab govitecan.  However since-clinically stable/tolerating current therapy fairly well we will continue current therapy.   # Proceed with Taxol chemotherapy today- at 83 mg.m2. Labs today reviewed;  acceptable for treatment today.  Patient s/p palliative radiation therapy to her right supraclavicular nodal metastasis as well as T2 vertebral body [30 Gray in 10 fractions ;[5/31 to  6/13].  I reviewed the findings of the CT scan as above.  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  # Left Knee- Bil-likely secondary to osteoarthritis less likely from [facility]; s/p [facility]- s/p aspiration; s/p steroid injection-   STABLE.  # T2 thoracic metastatic disease [confirmed MRI Jan 2023]-currently s/p Radiation [ 6/13-last] S/p  Zometa q4 W- STABLE.   # venofer   # HYPO Kalemia potassium 3.2 continue K-Dur 20 mg twice a day recommend inreasing fluid intake.-STABLE today 4.0.   # PN-1-2 sec to taxol-on Neurontin 200 qhs-STABLE.   #  DM/poorly controlled- continue metformin 2000 mg q AM [Dr.Tullo]; on glipizide 10 mg XL. Continue follow up with  PCP.  On CGM.- FBG- 151 -STABLE  # Iron def anemia-[Iron studies- July 2023] continue Venofer-.    #Incidental findings on Imaging  C scan- JULY , 2023: Cirrhosis; atherosclerosis I reviewed/discussed/counseled the patient.   # Mediport: Functioning.   Key-3week; Taxol-weekx3;1 w-off; Zometa- 7/24  [q4w]- add to 8/21 visit..  *Add TSH # DISPOSITION:  # Taxol; Venofer- today;Zometa  # cancel 8/14 appt.  # Follow up 2 weeks; MD:  labs- cbc/cmp; Taxol; venofer  # Follow up 3  week--labs- cbc/cmp;TSH taxol-Keytruda- Dr.B

## 2021-11-13 NOTE — Progress Notes (Unsigned)
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 Byrnett.] [id number] Carbo-taxol-AC;  JAN 2022-s/p Lumpectomy & ALND- [ypT1a (3m ) ypN1 (2/11 LN-positive)]; s/p radiation end of April 2022.  #Early June 2022-Xeloda 1000 mg per metered square [2000] 2 weeks on 1 week of 6-8 cycles; July 11th 2022- STARTING cycle #3-cut down the dose to 1500 mg BID[sec to HFS].   # JAN 2023- A. SKIN, RIGHT BREAST; PUNCH BIOPSY: [Dr.Byrnett] - INVASIVE CARCINOMA WITH DERMAL INFILTRATION AND LYMPHATIC INVASION. ER- weak POSISTIVE [10%]; PR/her2 NEG [[facility] 0]; JAN 20th, 2023- IMPRESSION: 1. Although the hypermetabolic right axillary node has resolved since the prior PET, there is a new hypermetabolic right low cervical/supraclavicular node, suspicious for nodal metastasis. 2. Decrease in right breast hypermetabolism with nonspecific overlying skin thickening in the setting of prior lumpectomy. 3. Low-level hypermetabolism within the T2 vertebral body, suspicious for isolated osseous metastasis. This could be confirmed with pre and postcontrast thoracic spine MRI. 4. Hypermetabolic thoracic nodes, slightly progressive. Given progression, nodal metastasis are slightly favored over reactive etiologies. 5. Incidental findings, including: Aortic Atherosclerosis (ICD10-I70.0). Tiny hiatal hernia. Hepatic steatosis with nonspecific caudate lobe enlargemen  Comment:  The carcinoma is  morphologically similar to the post-neoadjuvant mammary  carcinoma seen in the January 2022 right breast wide excision and lymph  Nodes  # FEB 6th-Taxol single agent #1; FEB 13th, 2023- [CPS =15]; Taxol-Keytruda  MAY 2023- CT CHEST:  Since the PET of 04/15/2021, enlargement of the previously described hypermetabolic right low cervical/supraclavicular nodal metastasis. Foci of hyperenhancement within the right breast are new or more well-defined since 02/02/2021, suspicious for progressive residual/recurrent disease. Nonspecific mediastinal nodes, similar to slightly decreased in size;  New sclerosis at the site of a known T2 vertebral body metastasis.   # s/p palliative radiation therapy to her right supraclavicular nodal metastasis as well as T2 vertebral body [30 Gray in 10 fractions ;[5/31 to  6/13].  CONTINUED TAXOL-KEYTRUDA   DIAGNOSIS: Right breast cancer triple negative      Carcinoma of upper-inner quadrant of right breast in female, estrogen receptor negative ([place]  10/07/2019 Initial Diagnosis   Carcinoma of upper-inner quadrant of right breast in female, estrogen receptor negative ([place]   12/04/2019 Genetic Testing   Negative genetic testing. No pathogenic variants identified on the [facility] Common Hereditary Cancers Panel + Skin Cancers Panel. The report date is 12/04/2019.  The Common Hereditary Cancers Panel + Skin Cancers Panel offered by [facility] includes sequencing and/or deletion duplication testing of the following 54 genes: APC*, ATM*, AXIN2, BAP1, BARD1, BMPR1A, BRCA1, BRCA2, BRIP1, CDH1, CDK4, CDKN2A (p14ARF), CDKN2A (p16INK4a), CHEK2, CTNNA1, DICER1*, EPCAM*, GREM1*, HOXB13, KIT, MEN1*, MITF*, MLH1*, MSH2*, MSH3*, MSH6*, MUTYH, NBN, NF1*, NTHL1, PALB2, PDGFRA, PMS2*, POLD1*, POLE, POT1, PTCH1, PTEN*, RAD50, RAD51C, RAD51D, RB1*, RNF43, SDHA*, SDHB, SDHC*, SDHD, SMAD4, SMARCA4, STK11, SUFU, TP53, TSC1*, TSC2, VHL.    05/07/2020 Cancer Staging   Staging form: Breast, AJCC 8th  Edition - Clinical: Stage IIIB (cT2, cN1, cM0, G3, ER-, PR-, HER2-) - Signed by BCammie Sickle MD on 05/07/2020  04/18/2021 Cancer Staging   Staging form: Breast, AJCC 8th Edition - Pathologic: Stage IV (pT4d, pN3c, cM1, ER+, PR-, HER2-) - Signed by Cammie Sickle, MD on 04/18/2021 Nuclear grade: G3      HISTORY OF PRESENTING ILLNESS: Accompanied by sister.   Walking independently.  Ashlee Cox 71 y.o.  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  She continues to take Synthroid for her iatrogenic hypothyroidism.  She continues to be compliant with her Synthroid.   Patient denies any diarrhea.  No nausea vomiting. Patient complains of mild tingling and numbness in extremities. No falls.  Patient admits to resolution of the breast skin rash.  Complains of mild to moderate fatigue.   Review of Systems  Constitutional:  Positive for malaise/fatigue. Negative for chills, diaphoresis, fever and weight loss.  HENT:  Negative for nosebleeds and sore throat.   Eyes:  Negative for double vision.  Respiratory:  Negative for cough, hemoptysis, sputum production and wheezing.   Cardiovascular:  Negative for chest pain, palpitations, orthopnea and leg swelling.  Gastrointestinal:  Negative for abdominal pain, blood in stool, constipation and melena.  Genitourinary:  Negative for dysuria, frequency and urgency.  Musculoskeletal:  Positive for back pain and neck pain. Negative for joint pain.  Neurological:  Positive for tingling. Negative for dizziness, focal weakness, weakness and headaches.  Endo/Heme/Allergies:  Does not bruise/bleed easily.  Psychiatric/Behavioral:  Negative for depression. The patient is not nervous/anxious and does not have insomnia.      MEDICAL HISTORY:  Past Medical History:  Diagnosis Date   Cancer [facility]) 2021   right breast   Cyst, breast    benign    Diabetes mellitus without complication ([place])    Family history of melanoma    Family history of ovarian cancer    GERD (gastroesophageal reflux disease)    H/O: rheumatic fever    History of colonoscopy 2010   done bc of bleeding,  normal , due back in 5 yrs ([place])   Hyperlipidemia    Hypertension    Menopause    at age 6   Personal history of chemotherapy 09/2019   RIGHT  INVASIVE MAMMARY CARCINOMA   Personal history of radiation therapy     SURGICAL HISTORY: Past Surgical History:  Procedure Laterality Date   APPENDECTOMY  2006   BREAST BIOPSY Right 09/22/2019   INVASIVE MAMMARY CARCINOMA   BREAST CYST ASPIRATION Left 1999   BREAST LUMPECTOMY Left 04/23/2020   surgery with NL and SN    BREAST LUMPECTOMY WITH NEEDLE LOCALIZATION Right 04/23/2020   Procedure: BREAST LUMPECTOMY WITH NEEDLE LOCALIZATION;  Surgeon: Robert Bellow, MD;  Location: [facility] ORS;  Service: General;  Laterality: Right;   BREAST LUMPECTOMY WITH SENTINEL LYMPH NODE BIOPSY Right 04/23/2020   Procedure: BREAST LUMPECTOMY WITH SENTINEL LYMPH NODE BX;  Surgeon: Robert Bellow, MD;  Location: [facility] ORS;  Service: General;  Laterality: Right;   CHOLECYSTECTOMY  2006   COLONOSCOPY     PORTACATH PLACEMENT Left 10/06/2019   Procedure: INSERTION PORT-A-CATH;  Surgeon: Robert Bellow, MD;  Location: [facility] ORS;  Service: General;  Laterality: Left;   SUBMUCOSAL TATTOO INJECTION Right 10/06/2019   Procedure: Right axillary TATTOO INJECTION;  Surgeon: Robert Bellow, MD;  Location: [facility] ORS;  Service: General;  Laterality: Right;   VAGINAL DELIVERY     x3    SOCIAL HISTORY: Social History   Socioeconomic History   Marital status:  Married    Spouse name: Chrissie Noa   Number of children: 3   Years of education: Not on file   Highest education level: Not on file  Occupational History   Occupation: Glass blower/designer: [employer]  Tobacco Use   Smoking status: Former    Years: 1.00    Types: Cigarettes     Quit date: 12/26/2005    Years since quitting: 15.8   Smokeless tobacco: Never   Tobacco comments:    smoked for less than 1 years,  1 cig/day  Vaping Use   Vaping Use: Never used  Substance and Sexual Activity   Alcohol use: No   Drug use: No   Sexual activity: Never    Partners: Female  Other Topics Concern   Not on file  Social History Narrative   Widowed, March 2014; remarried.      [employer] ret[employer]; quit smoking 1ppw; no alcohol.       Social Determinants of Health   Financial Resource Strain: Medium Risk (05/25/2021)   Overall Financial Resource Strain (CARDIA)    Difficulty of Paying Living Expenses: Somewhat hard  Food Insecurity: No Food Insecurity (05/18/2021)   Hunger Vital Sign    Worried About Running Out of Food in the Last Year: Never true    Ran Out of Food in the Last Year: Never true  Transportation Needs: No Transportation Needs (05/18/2021)   PRAPARE - Hydrologist (Medical): No    Lack of Transportation (Non-Medical): No  Physical Activity: Inactive (05/18/2021)   Exercise Vital Sign    Days of Exercise per Week: 0 days    Minutes of Exercise per Session: 0 min  Stress: No Stress Concern Present (05/18/2021)   [place]    Feeling of Stress : Not at all  Social Connections: [place] (05/18/2021)   Social Connection and Isolation Panel [NHANES]    Frequency of Communication with Friends and Family: More than three times a week    Frequency of Social Gatherings with Friends and Family: More than three times a week    Attends Religious Services: More than 4 times per year    Active Member of [employer] or Organizations: Yes    Attends Music therapist: More than 4 times per year    Marital Status: Married  Human resources officer Violence: Not At Risk (05/04/2021)   Humiliation, Afraid, Rape, and Kick questionnaire    Fear of Current or Ex-Partner: No     Emotionally Abused: No    Physically Abused: No    Sexually Abused: No    FAMILY HISTORY: Family History  Problem Relation Age of Onset   Cancer Mother 45       ovarian- died 4-5 years.    Heart disease Mother 78   Diabetes Mother    Stroke Father 20   Diabetes Father    Diabetes Sister    Melanoma Sister        survived   Breast cancer Neg Hx     ALLERGIES:  has No Known Allergies.  MEDICATIONS:  Current Outpatient Medications  Medication Sig Dispense Refill   acetaminophen (TYLENOL) 500 MG tablet Take 500 mg by mouth every 8 (eight) hours as needed for moderate pain.     calcium carbonate (OS-CAL) 600 MG TABS tablet Take 600 mg by mouth daily.     Continuous Blood Gluc Sensor (FREESTYLE LIBRE 3 SENSOR) MISC Apply 1  each topically every 14 (fourteen) days. Place 1 sensor on the skin every 14 days. Use to check glucose continuously 6 each 3   diphenhydrAMINE HCl, Sleep, (ZZZQUIL PO) Take 1 tablet by mouth at bedtime as needed.     gabapentin (NEURONTIN) 100 MG capsule TAKE 2 CAPSULES BY MOUTH AT BEDTIME 180 capsule 0   glipiZIDE (GLUCOTROL XL) 10 MG 24 hr tablet Take 1 tablet by mouth once daily with breakfast 90 tablet 0   KLOR-CON M20 20 MEQ tablet Take 1 tablet by mouth twice daily 60 tablet 0   levothyroxine (SYNTHROID) 100 MCG tablet Take 1 tablet (100 mcg total) by mouth daily before breakfast. 30 tablet 3   lidocaine-prilocaine (EMLA) cream Apply 1 application topically as needed. Apply to port and cover with saran wrap 1-2 hours prior to port access 30 g 1   losartan-hydrochlorothiazide (HYZAAR) 50-12.5 MG tablet Take 1 tablet by mouth once daily 90 tablet 0   lovastatin (MEVACOR) 40 MG tablet TAKE 1 TABLET BY MOUTH AT BEDTIME 90 tablet 1   magnesium oxide (MAG-OX) 400 (240 Mg) MG tablet Take 400 mg by mouth daily.     metFORMIN (GLUCOPHAGE) 1000 MG tablet TAKE 2 TABLETS BY MOUTH ONCE DAILY WITH BREAKFAST 180 tablet 0   Multiple Vitamin (MULTIVITAMIN) tablet Take 1  tablet by mouth daily.     Multiple Vitamins-Minerals (MULTI-VITAMIN GUMMIES) CHEW Chew 1 tablet by mouth 1 day or 1 dose.     omeprazole (PRILOSEC) 20 MG capsule Take 1 capsule by mouth once daily 90 capsule 0   Semaglutide,0.25 or 0.5MG/DOS, (OZEMPIC, 0.25 OR 0.5 MG/DOSE,) 2 MG/1.5ML SOPN Inject 0.5 mg into the skin once a week. 1.5 mL 11   vitamin B-12 (CYANOCOBALAMIN) 100 MCG tablet Take 100 mcg by mouth daily.     No current facility-administered medications for this visit.      Marland Kitchen  PHYSICAL EXAMINATION: ECOG PERFORMANCE STATUS: 0 - Asymptomatic  There were no vitals filed for this visit.   There were no vitals filed for this visit.   Right supraclavicular lymphadenopathy felt. 2-3 cm in size.   Physical Exam HENT:     Head: Normocephalic and atraumatic.     Mouth/Throat:     Pharynx: No oropharyngeal exudate.  Eyes:     Pupils: Pupils are equal, round, and reactive to light.  Cardiovascular:     Rate and Rhythm: Normal rate and regular rhythm.  Pulmonary:     Effort: Pulmonary effort is normal. No respiratory distress.     Breath sounds: Normal breath sounds. No wheezing.  Abdominal:     General: Bowel sounds are normal. There is no distension.     Palpations: Abdomen is soft. There is no mass.     Tenderness: There is no abdominal tenderness. There is no guarding or rebound.  Musculoskeletal:        General: No tenderness. Normal range of motion.     Cervical back: Normal range of motion and neck supple.  Skin:    General: Skin is warm.  Neurological:     Mental Status: She is alert and oriented to person, place, and time.  Psychiatric:        Mood and Affect: Affect normal.      LABORATORY DATA:  I have reviewed the data as listed Lab Results  Component Value Date   WBC 3.6 (L) 10/31/2021   HGB 8.9 (L) 10/31/2021   HCT 28.2 (L) 10/31/2021   MCV 86.0 10/31/2021  PLT 215 10/31/2021   Recent Labs    10/17/21 0820 10/24/21 0805 10/31/21 0827  NA  138 137 138  K 3.7 3.9 4.0  CL 104 106 104  CO2 22 23 25   GLUCOSE 171* 145* 128*  BUN 13 16 13   CREATININE 0.75 0.84 0.95  CALCIUM 9.3 9.2 9.3  GFRNONAA >60 >60 >60  PROT 7.5 7.3 7.4  ALBUMIN 3.8 3.9 3.8  AST 31 35 44*  ALT 15 20 23   ALKPHOS 84 90 92  BILITOT 0.6 0.5 0.6    RADIOGRAPHIC STUDIES: I have personally reviewed the radiological images as listed and agreed with the findings in the report. CT SOFT TISSUE NECK W CONTRAST  Result Date: 10/21/2021 CLINICAL DATA:  Neck mass on right side.  History of breast cancer. EXAM: CT NECK WITH CONTRAST TECHNIQUE: Multidetector CT imaging of the neck was performed using the standard protocol following the bolus administration of intravenous contrast. RADIATION DOSE REDUCTION: This exam was performed according to the departmental dose-optimization program which includes automated exposure control, adjustment of the mA and/or kV according to patient size and/or use of iterative reconstruction technique. CONTRAST:  160m OMNIPAQUE IOHEXOL 300 MG/ML  SOLN COMPARISON:  CT neck 07/27/2018 FINDINGS: Pharynx and larynx: The nasal cavity and nasopharynx are unremarkable. The oral cavity and oropharynx are unremarkable. The hypopharynx and larynx are unremarkable. The vocal folds are normal in appearance. There is no retropharyngeal fluid collection. Salivary glands: The parotid and submandibular glands are unremarkable. Thyroid: Unremarkable. Lymph nodes: The right level V lymph node measuring 2.0 cm x 2.4 cm x 1.8 cm is increased in size in the AP dimension, previously measuring 2.4 cm x 1.3 cm by 1.7 cm. There is no other pathologic lymphadenopathy in the neck. Vascular: The major vasculature of the neck is unremarkable. A left chest wall port is partially imaged. Limited intracranial: The imaged portions of the intracranial compartment are unremarkable. Visualized orbits: Globes and orbits are unremarkable. Mastoids and visualized paranasal sinuses: The  paranasal sinuses and mastoid air cells are clear. Skeleton: Again seen is the pathologic fracture in the T2 vertebral body with associated sclerotic metastatic disease, similar in appearance to the prior study. There is no new acute osseous abnormality or new suspicious osseous lesion. Upper chest: The lungs are assessed on the separately dictated CT chest. Other: None. IMPRESSION: Since 07/26/2021: 1. Slight interval increase in size of the right level V lymph node. No other pathologic lymphadenopathy in the neck. 2. Unchanged metastatic disease in the T2 vertebral body with associated pathologic fracture. Electronically Signed   By: PValetta MoleM.D.   On: 10/21/2021 15:36   CT CHEST ABDOMEN PELVIS W CONTRAST  Result Date: 10/21/2021 CLINICAL DATA:  Restaging of breast cancer. * Tracking Code: BO * EXAM: CT CHEST, ABDOMEN, AND PELVIS WITH CONTRAST TECHNIQUE: Multidetector CT imaging of the chest, abdomen and pelvis was performed following the standard protocol during bolus administration of intravenous contrast. RADIATION DOSE REDUCTION: This exam was performed according to the departmental dose-optimization program which includes automated exposure control, adjustment of the mA and/or kV according to patient size and/or use of iterative reconstruction technique. CONTRAST:  1072mOMNIPAQUE IOHEXOL 300 MG/ML  SOLN COMPARISON:  07/26/2021 FINDINGS: CT CHEST FINDINGS Cardiovascular: Left Port-A-Cath tip: Lower SVC. Atherosclerotic calcification of the aortic arch. Mediastinum/Nodes: Circumferential distal esophageal wall thickening. Small hiatal hernia. Lungs/Pleura: Biapical pleuroparenchymal scarring. No new or progressive lesion identified. Musculoskeletal: Stable sclerosis in the T2 vertebral body compatible with previous metastatic lesion.  Continue cutaneous thickening in the right breast. Enhancing right lateral breast lesion 3.0 by 2.3 cm on image 28 series 4, previously 2.2 by 2.2 cm. Somewhat  retroareolar enhancing lesion along the right breast glandular tissue 1.9 by 1.6 cm on image 24 series 4, previously 1.7 by 1.2 cm. Other smaller foci of nodularity along the right breast glandular tissues noted. CT ABDOMEN PELVIS FINDINGS Hepatobiliary: Caudate lobe burr prominence raises the possibility of early cirrhosis. Cholecystectomy. No focal liver lesion identified. Portosystemic shunting extending through the left hepatic lobe and beyond the diaphragm to the left internal mammary vein region. Pancreas: Unremarkable Spleen: Unremarkable Adrenals/Urinary Tract: Slight effacement of the right adrenal gland, possibly due to a retroperitoneal lipoma or less likely myelolipoma. Single small hypodense renal lesions are technically too small to characterize although statistically likely to be benign. Stomach/Bowel: Unremarkable Vascular/Lymphatic: Atherosclerosis is present, including aortoiliac atherosclerotic disease. No pathologic adenopathy. Reproductive: Unremarkable Other: Scarring or trace free fluid posterior to the right ovary. 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 Grade 1 degenerative anterolisthesis at L4-5. IMPRESSION: 1. Enlarging soft tissue lesions in the right breast, cannot exclude active breast malignancy. Stable cutaneous thickening in the right breast. 2. Stable sclerotic lesion in the T2 vertebral body likely a previous metastatic lesion. No change or progression. 3. Probable early cirrhosis with caudate lobe prominence and a dilated portal venous branch extending from the left hepatic lobe to the left internal mammary vein, suggesting portal venous hypertension. 4. Other imaging findings of potential clinical significance: Aortic Atherosclerosis (ICD10-I70.0). Small hiatal hernia. Circumferential distal esophageal wall thickening, esophagitis would be a common cause. 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 Stable  scarring or trace free fluid posterior to the right ovary. Electronically Signed   By: Van Clines M.D.   On: 10/21/2021 14:12    ASSESSMENT & PLAN:   Carcinoma of upper-inner quadrant of right breast in female, estrogen receptor negative ([place]) #Recurrent right breast cancer-s/p breast skin biopsy [FEB 2023- ER positive /PR HER2-0; however 2017-ER-NEGATIVE; -HER2 NEU-1+/LOW]; - recurrent- stage IV; NGS -FEB 2023- CPS =15%. Currently on Keytruda- taxol [2/13]; JULY 28th, 2023-  Enlarging soft tissue lesions in the right breast; Stable cutaneous thickening in the right breast;  Stable sclerotic lesion in the T2 vertebral body likely a previous metastatic lesion. JULY 2023- CT neck-  The right level V lymph node measuring 2.0 cm x 2.4 cm x 1.8 cm is increased in size in the AP dimension, previously measuring 2.4 cm x 1.3 cm by 1.7 cm. There is no other pathologic lymphadenopathy in the neck.   #I discussed with patient about results of the CT scan-increase growth in the breast lesion; and also neck lymphadenopathy; however no evidence of any new disease noted.  I discussed the option of switching to alternative options- like saciztuzumab govitecan.  However since-clinically stable/tolerating current therapy fairly well we will continue current therapy.   # Proceed with Taxol chemotherapy today- at 27 mg.m2. Labs today reviewed;  acceptable for treatment today.  Patient s/p palliative radiation therapy to her right supraclavicular nodal metastasis as well as T2 vertebral body [30 Gray in 10 fractions ;[5/31 to  6/13].  I reviewed the findings of the CT scan as above.  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  # T2 thoracic metastatic disease [confirmed MRI Jan  2023]-currently s/p Radiation [ 6/13-last] S/p  Zometa q4 W- STABLE.   # venofer   # HYPO [place] potassium 3.2 continue K-Dur 20 mg twice a day recommend inreasing fluid intake.-STABLE today 4.0.   # PN-1-2 sec to taxol-on Neurontin 200 qhs-STABLE.   # DM/poorly controlled- continue metformin 2000 mg q AM [Dr.Tullo]; on glipizide 10 mg XL. Continue follow up with  PCP.  On CGM.- FBG- 151 -STABLE  # Iron def anemia-[Iron studies- July 2023] continue Venofer-.    #Incidental findings on Imaging  C scan- JULY , 2023: Cirrhosis; atherosclerosis I reviewed/discussed/counseled the patient.   # Mediport: Functioning.   Key-3week; Taxol-weekx3;1 w-off; Zometa- 7/24  [q4w]- add to 8/21 visit..  *Add TSH # DISPOSITION:  # Taxol; Venofer- today;Zometa  # cancel 8/14 appt.  # Follow up 2 weeks; MD:  labs- cbc/cmp; Taxol; venofer  # Follow up 3  week--labs- cbc/cmp;TSH taxol-Keytruda- Dr.B     All questions were answered. The patient/family knows to call the clinic with any problems, questions or concerns.   Cammie Sickle, MD 11/13/2021 12:54 PM

## 2021-11-14 ENCOUNTER — Ambulatory Visit: Payer: PPO

## 2021-11-14 ENCOUNTER — Ambulatory Visit
Admission: RE | Admit: 2021-11-14 | Discharge: 2021-11-14 | Disposition: A | Discharge status: Home / Self Care | Payer: PPO | Source: Ambulatory Visit | Attending: Internal Medicine | Admitting: Internal Medicine

## 2021-11-14 ENCOUNTER — Inpatient Hospital Stay: Payer: PPO

## 2021-11-14 ENCOUNTER — Encounter: Payer: Self-pay | Admitting: Internal Medicine

## 2021-11-14 ENCOUNTER — Inpatient Hospital Stay (HOSPITAL_BASED_OUTPATIENT_CLINIC_OR_DEPARTMENT_OTHER): Payer: PPO | Admitting: Internal Medicine

## 2021-11-14 ENCOUNTER — Ambulatory Visit
Admission: RE | Admit: 2021-11-14 | Discharge: 2021-11-14 | Disposition: A | Discharge status: Home / Self Care | Payer: PPO | Attending: Internal Medicine | Admitting: Internal Medicine

## 2021-11-14 ENCOUNTER — Other Ambulatory Visit: Payer: Self-pay | Admitting: Internal Medicine

## 2021-11-14 VITALS — BP 126/82 | HR 99 | Temp 99.3°F | Ht 67.0 in | Wt 186.4 lb

## 2021-11-14 VITALS — BP 115/57 | HR 92 | Temp 97.5°F

## 2021-11-14 DIAGNOSIS — Z171 Estrogen receptor negative status [ER-]: Secondary | ICD-10-CM | POA: Insufficient documentation

## 2021-11-14 DIAGNOSIS — D4861 Neoplasm of uncertain behavior of right breast: Secondary | ICD-10-CM | POA: Diagnosis not present

## 2021-11-14 DIAGNOSIS — C50211 Malignant neoplasm of upper-inner quadrant of right female breast: Secondary | ICD-10-CM | POA: Insufficient documentation

## 2021-11-14 DIAGNOSIS — M25552 Pain in left hip: Secondary | ICD-10-CM | POA: Diagnosis not present

## 2021-11-14 DIAGNOSIS — Z5112 Encounter for antineoplastic immunotherapy: Secondary | ICD-10-CM | POA: Diagnosis not present

## 2021-11-14 LAB — CBC WITH DIFFERENTIAL/PLATELET
Abs Immature Granulocytes: 0.04 10*3/uL (ref 0.00–0.07)
Basophils Absolute: 0.1 10*3/uL (ref 0.0–0.1)
Basophils Relative: 1 %
Eosinophils Absolute: 0.1 10*3/uL (ref 0.0–0.5)
Eosinophils Relative: 1 %
HCT: 30 % — ABNORMAL LOW (ref 36.0–46.0)
Hemoglobin: 9.5 g/dL — ABNORMAL LOW (ref 12.0–15.0)
Immature Granulocytes: 1 %
Lymphocytes Relative: 20 %
Lymphs Abs: 1 10*3/uL (ref 0.7–4.0)
MCH: 27.2 pg (ref 26.0–34.0)
MCHC: 31.7 g/dL (ref 30.0–36.0)
MCV: 86 fL (ref 80.0–100.0)
Monocytes Absolute: 0.5 10*3/uL (ref 0.1–1.0)
Monocytes Relative: 9 %
Neutro Abs: 3.5 10*3/uL (ref 1.7–7.7)
Neutrophils Relative %: 68 %
Platelets: 218 10*3/uL (ref 150–400)
RBC: 3.49 MIL/uL — ABNORMAL LOW (ref 3.87–5.11)
RDW: 20.9 % — ABNORMAL HIGH (ref 11.5–15.5)
WBC: 5.2 10*3/uL (ref 4.0–10.5)
nRBC: 0 % (ref 0.0–0.2)

## 2021-11-14 LAB — COMPREHENSIVE METABOLIC PANEL
ALT: 14 U/L (ref 0–44)
AST: 32 U/L (ref 15–41)
Albumin: 3.8 g/dL (ref 3.5–5.0)
Alkaline Phosphatase: 118 U/L (ref 38–126)
Anion gap: 9 (ref 5–15)
BUN: 11 mg/dL (ref 8–23)
CO2: 22 mmol/L (ref 22–32)
Calcium: 9.5 mg/dL (ref 8.9–10.3)
Chloride: 105 mmol/L (ref 98–111)
Creatinine, Ser: 0.79 mg/dL (ref 0.44–1.00)
GFR, Estimated: >60 mL/min (ref >60)
Glucose, Bld: 144 mg/dL — ABNORMAL HIGH (ref 70–99)
Potassium: 3.9 mmol/L (ref 3.5–5.1)
Sodium: 136 mmol/L (ref 135–145)
Total Bilirubin: 0.7 mg/dL (ref 0.3–1.2)
Total Protein: 7.9 g/dL (ref 6.5–8.1)

## 2021-11-14 LAB — TSH: TSH: 23.771 u[IU]/mL — ABNORMAL HIGH (ref 0.350–4.500)

## 2021-11-14 MED ORDER — DIPHENHYDRAMINE HCL 50 MG/ML IJ SOLN
50.0000 mg | Freq: Once | INTRAMUSCULAR | Status: AC
  Administered 2021-11-14: 50 mg via INTRAVENOUS
  Filled 2021-11-14: qty 1

## 2021-11-14 MED ORDER — SODIUM CHLORIDE 0.9 % IV SOLN
60.0000 mg/m2 | Freq: Once | INTRAVENOUS | Status: AC
  Administered 2021-11-14: 120 mg via INTRAVENOUS
  Filled 2021-11-14: qty 20

## 2021-11-14 MED ORDER — LEVOTHYROXINE SODIUM 100 MCG PO TABS: 100.0000 ug | ORAL_TABLET | Freq: Every day | ORAL | 3 refills | Status: DC

## 2021-11-14 MED ORDER — HEPARIN SOD (PORK) LOCK FLUSH 100 UNIT/ML IV SOLN
500.0000 [IU] | Freq: Once | INTRAVENOUS | Status: AC | PRN
  Filled 2021-11-14: qty 5

## 2021-11-14 MED ORDER — HEPARIN SOD (PORK) LOCK FLUSH 100 UNIT/ML IV SOLN
INTRAVENOUS | Status: AC
  Administered 2021-11-14: 500 [IU]
  Filled 2021-11-14: qty 5

## 2021-11-14 MED ORDER — SODIUM CHLORIDE 0.9 % IV SOLN
10.0000 mg | Freq: Once | INTRAVENOUS | Status: AC
  Administered 2021-11-14: 10 mg via INTRAVENOUS
  Filled 2021-11-14: qty 1

## 2021-11-14 MED ORDER — SODIUM CHLORIDE 0.9 % IV SOLN
200.0000 mg | Freq: Once | INTRAVENOUS | Status: AC
  Administered 2021-11-14: 200 mg via INTRAVENOUS
  Filled 2021-11-14: qty 10

## 2021-11-14 MED ORDER — FAMOTIDINE IN NACL 20-0.9 MG/50ML-% IV SOLN
20.0000 mg | Freq: Once | INTRAVENOUS | Status: AC
  Administered 2021-11-14: 20 mg via INTRAVENOUS
  Filled 2021-11-14: qty 50

## 2021-11-14 MED ORDER — SODIUM CHLORIDE 0.9 % IV SOLN
Freq: Once | INTRAVENOUS | Status: AC
  Filled 2021-11-14: qty 250

## 2021-11-14 NOTE — Patient Instructions (Signed)
[facility] CANCER CTR AT [place]  Discharge Instructions: Thank you for choosing [place] to provide your oncology and hematology care.  If you have a lab appointment with the [facility], please go directly to the [facility] and check in at the registration area.  Wear comfortable clothing and clothing appropriate for easy access to any Portacath or PICC line.   We strive to give you quality time with your provider. You may need to reschedule your appointment if you arrive late (15 or more minutes).  Arriving late affects you and other patients whose appointments are after yours.  Also, if you miss three or more appointments without notifying the office, you may be dismissed from the clinic at the provider's discretion.      For prescription refill requests, have your pharmacy contact our office and allow 72 hours for refills to be completed.    Today you received the following chemotherapy and/or immunotherapy agents Taxol      To help prevent nausea and vomiting after your treatment, we encourage you to take your nausea medication as directed.  BELOW ARE SYMPTOMS THAT SHOULD BE REPORTED IMMEDIATELY: *FEVER GREATER THAN 100.4 F (38 C) OR HIGHER *CHILLS OR SWEATING *NAUSEA AND VOMITING THAT IS NOT CONTROLLED WITH YOUR NAUSEA MEDICATION *UNUSUAL SHORTNESS OF BREATH *UNUSUAL BRUISING OR BLEEDING *URINARY PROBLEMS (pain or burning when urinating, or frequent urination) *BOWEL PROBLEMS (unusual diarrhea, constipation, pain near the anus) TENDERNESS IN MOUTH AND THROAT WITH OR WITHOUT PRESENCE OF ULCERS (sore throat, sores in mouth, or a toothache) UNUSUAL RASH, SWELLING OR PAIN  UNUSUAL VAGINAL DISCHARGE OR ITCHING   Items with * indicate a potential emergency and should be followed up as soon as possible or go to the Emergency Department if any problems should occur.  Please show the CHEMOTHERAPY ALERT CARD or IMMUNOTHERAPY ALERT CARD at check-in to the  Emergency Department and triage nurse.  Should you have questions after your visit or need to cancel or reschedule your appointment, please contact [facility] CANCER [place] AT [employer]  [phone] and follow the prompts.  Office hours are 8:00 a.m. to 4:30 p.m. Monday - Friday. Please note that voicemails left after 4:00 p.m. may not be returned until the following business day.  We are closed weekends and major holidays. You have access to a nurse at all times for urgent questions. Please call the main number to the clinic [phone] and follow the prompts.  For any non-urgent questions, you may also contact your provider using MyChart. We now offer e-Visits for anyone 52 and older to request care online for non-urgent symptoms. For details visit [email or web address].   Also download the MyChart app! Go to the app store, search "MyChart", open the app, select [place], and log in with your MyChart username and password.  Masks are optional in the cancer centers. If you would like for your care team to wear a mask while they are taking care of you, please let them know. For doctor visits, patients may have with them one support person who is at least 69 years old. At this time, visitors are not allowed in the infusion area.    Iron Sucrose Injection What is this medication? IRON SUCROSE (EYE ern SOO krose) treats low levels of iron (iron deficiency anemia) in people with kidney disease. Iron is a mineral that plays an important role in making red blood cells, which carry oxygen from your lungs to the rest of your body. This medicine  may be used for other purposes; ask your health care provider or pharmacist if you have questions. COMMON BRAND NAME(S): Venofer What should I tell my care team before I take this medication? They need to know if you have any of these conditions: Anemia not caused by low iron levels Heart disease High levels of iron in the blood Kidney  disease Liver disease An unusual or allergic reaction to iron, other medications, foods, dyes, or preservatives Pregnant or trying to get pregnant Breast-feeding How should I use this medication? This medication is for infusion into a vein. It is given in a hospital or clinic setting. Talk to your care team about the use of this medication in children. While this medication may be prescribed for children as young as 2 years for selected conditions, precautions do apply. Overdosage: If you think you have taken too much of this medicine contact a poison control center or emergency room at once. NOTE: This medicine is only for you. Do not share this medicine with others. What if I miss a dose? It is important not to miss your dose. Call your care team if you are unable to keep an appointment. What may interact with this medication? Do not take this medication with any of the following: Deferoxamine Dimercaprol Other iron products This medication may also interact with the following: Chloramphenicol Deferasirox This list may not describe all possible interactions. Give your health care provider a list of all the medicines, herbs, non-prescription drugs, or dietary supplements you use. Also tell them if you smoke, drink alcohol, or use illegal drugs. Some items may interact with your medicine. What should I watch for while using this medication? Visit your care team regularly. Tell your care team if your symptoms do not start to get better or if they get worse. You may need blood work done while you are taking this medication. You may need to follow a special diet. Talk to your care team. Foods that contain iron include: whole grains/cereals, dried fruits, beans, or peas, leafy green vegetables, and organ meats (liver, kidney). What side effects may I notice from receiving this medication? Side effects that you should report to your care team as soon as possible: Allergic reactions--skin rash,  itching, hives, swelling of the face, lips, tongue, or throat Low blood pressure--dizziness, feeling faint or lightheaded, blurry vision Shortness of breath Side effects that usually do not require medical attention (report to your care team if they continue or are bothersome): Flushing Headache Joint pain Muscle pain Nausea Pain, redness, or irritation at injection site This list may not describe all possible side effects. Call your doctor for medical advice about side effects. You may report side effects to FDA at 1-800-FDA-1088. Where should I keep my medication? This medication is given in a hospital or clinic and will not be stored at home. NOTE: This sheet is a summary. It may not cover all possible information. If you have questions about this medicine, talk to your doctor, pharmacist, or health care provider.  2023 Elsevier/Gold Standard (2007-05-04 00:00:00)

## 2021-11-14 NOTE — Telephone Encounter (Signed)
Component Ref Range & Units 08:49 (11/14/21) 1 mo ago (10/03/21) 2 mo ago (09/12/21) 3 mo ago (08/15/21) 3 mo ago (08/08/21) 6 mo ago (05/09/21) 2 yr ago (04/24/19)  TSH 0.350 - 4.500 uIU/mL 23.771 High   20.100 High  R  29.700 High  R  57.700 High  R  54.000 High  CM  3.653 CM  1.92 R   T 4 pending

## 2021-11-14 NOTE — Progress Notes (Signed)
Continue dose of paclitaxel at 60 mg/m2.  T.O. Dr Dorris Fetch, PharmD

## 2021-11-14 NOTE — Progress Notes (Signed)
Pt states has had some weakness/pain in left hip/groin x1 week.

## 2021-11-15 ENCOUNTER — Encounter: Payer: Self-pay | Admitting: Internal Medicine

## 2021-11-15 LAB — T4: T4, Total: 6.8 ug/dL (ref 4.5–12.0)

## 2021-11-17 NOTE — Addendum Note (Signed)
Addended by: Cammie Sickle on: 11/17/2021 10:37 PM   Modules accepted: Orders, Level of Service

## 2021-11-18 ENCOUNTER — Telehealth: Payer: Self-pay

## 2021-11-18 ENCOUNTER — Other Ambulatory Visit: Payer: PPO

## 2021-11-18 ENCOUNTER — Other Ambulatory Visit (INDEPENDENT_AMBULATORY_CARE_PROVIDER_SITE_OTHER): Payer: PPO

## 2021-11-18 DIAGNOSIS — E785 Hyperlipidemia, unspecified: Secondary | ICD-10-CM

## 2021-11-18 DIAGNOSIS — I10 Essential (primary) hypertension: Secondary | ICD-10-CM | POA: Diagnosis not present

## 2021-11-18 DIAGNOSIS — E113559 Type 2 diabetes mellitus with stable proliferative diabetic retinopathy, unspecified eye: Secondary | ICD-10-CM

## 2021-11-18 LAB — COMPREHENSIVE METABOLIC PANEL
ALT: 13 U/L (ref 0–35)
AST: 27 U/L (ref 0–37)
Albumin: 4.1 g/dL (ref 3.5–5.2)
Alkaline Phosphatase: 123 U/L — ABNORMAL HIGH (ref 39–117)
BUN: 11 mg/dL (ref 6–23)
CO2: 22 mEq/L (ref 19–32)
Calcium: 9.6 mg/dL (ref 8.4–10.5)
Chloride: 102 mEq/L (ref 96–112)
Creatinine, Ser: 0.82 mg/dL (ref 0.40–1.20)
GFR: 73.17 mL/min (ref >60.00)
Glucose, Bld: 123 mg/dL — ABNORMAL HIGH (ref 70–99)
Potassium: 4.7 mEq/L (ref 3.5–5.1)
Sodium: 142 mEq/L (ref 135–145)
Total Bilirubin: 0.7 mg/dL (ref 0.2–1.2)
Total Protein: 7 g/dL (ref 6.0–8.3)

## 2021-11-18 LAB — MICROALBUMIN / CREATININE URINE RATIO
Creatinine,U: 153.2 mg/dL
Microalb Creat Ratio: 1.3 mg/g (ref 0.0–30.0)
Microalb, Ur: 2 mg/dL — ABNORMAL HIGH (ref 0.0–1.9)

## 2021-11-18 LAB — HEMOGLOBIN A1C: Hgb A1c MFr Bld: 6.5 % (ref 4.6–6.5)

## 2021-11-18 MED FILL — Dexamethasone Sodium Phosphate Inj 100 MG/10ML: INTRAMUSCULAR | Qty: 1 | Status: AC

## 2021-11-18 NOTE — Telephone Encounter (Signed)
Pt notified by detailed message.  Barbara-please sch ad notify pt, thanks.

## 2021-11-18 NOTE — Telephone Encounter (Signed)
-----   Message from Cammie Sickle, MD sent at 11/17/2021 10:37 PM EDT ----- Please schedule bone scan ASAP.  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 I would recommend bone scan for further evaluation of your bones. Thanks GB

## 2021-11-19 LAB — LIPID PANEL W/REFLEX DIRECT LDL
Cholesterol: 150 mg/dL (ref <200)
HDL: 38 mg/dL — ABNORMAL LOW (ref >=50)
LDL Cholesterol (Calc): 81 mg/dL (calc)
Non-HDL Cholesterol (Calc): 112 mg/dL (calc) (ref <130)
Total CHOL/HDL Ratio: 3.9 (calc) (ref <5.0)
Triglycerides: 223 mg/dL — ABNORMAL HIGH (ref <150)

## 2021-11-21 ENCOUNTER — Inpatient Hospital Stay: Payer: PPO

## 2021-11-21 VITALS — BP 131/70 | HR 90 | Temp 97.9°F | Resp 18 | Ht 67.0 in | Wt 186.7 lb

## 2021-11-21 DIAGNOSIS — Z5112 Encounter for antineoplastic immunotherapy: Secondary | ICD-10-CM | POA: Diagnosis not present

## 2021-11-21 DIAGNOSIS — D4861 Neoplasm of uncertain behavior of right breast: Secondary | ICD-10-CM

## 2021-11-21 LAB — CBC WITH DIFFERENTIAL/PLATELET
Abs Immature Granulocytes: 0.31 10*3/uL — ABNORMAL HIGH (ref 0.00–0.07)
Basophils Absolute: 0.1 10*3/uL (ref 0.0–0.1)
Basophils Relative: 1 %
Eosinophils Absolute: 0.1 10*3/uL (ref 0.0–0.5)
Eosinophils Relative: 1 %
HCT: 30.5 % — ABNORMAL LOW (ref 36.0–46.0)
Hemoglobin: 9.7 g/dL — ABNORMAL LOW (ref 12.0–15.0)
Immature Granulocytes: 5 %
Lymphocytes Relative: 18 %
Lymphs Abs: 1 10*3/uL (ref 0.7–4.0)
MCH: 27.2 pg (ref 26.0–34.0)
MCHC: 31.8 g/dL (ref 30.0–36.0)
MCV: 85.7 fL (ref 80.0–100.0)
Monocytes Absolute: 0.5 10*3/uL (ref 0.1–1.0)
Monocytes Relative: 8 %
Neutro Abs: 3.9 10*3/uL (ref 1.7–7.7)
Neutrophils Relative %: 67 %
Platelets: 254 10*3/uL (ref 150–400)
RBC: 3.56 MIL/uL — ABNORMAL LOW (ref 3.87–5.11)
RDW: 21.2 % — ABNORMAL HIGH (ref 11.5–15.5)
WBC: 5.9 10*3/uL (ref 4.0–10.5)
nRBC: 0.5 % — ABNORMAL HIGH (ref 0.0–0.2)

## 2021-11-21 LAB — COMPREHENSIVE METABOLIC PANEL
ALT: 17 U/L (ref 0–44)
AST: 40 U/L (ref 15–41)
Albumin: 3.7 g/dL (ref 3.5–5.0)
Alkaline Phosphatase: 114 U/L (ref 38–126)
Anion gap: 9 (ref 5–15)
BUN: 11 mg/dL (ref 8–23)
CO2: 23 mmol/L (ref 22–32)
Calcium: 9 mg/dL (ref 8.9–10.3)
Chloride: 104 mmol/L (ref 98–111)
Creatinine, Ser: 0.87 mg/dL (ref 0.44–1.00)
GFR, Estimated: >60 mL/min (ref >60)
Glucose, Bld: 156 mg/dL — ABNORMAL HIGH (ref 70–99)
Potassium: 3.8 mmol/L (ref 3.5–5.1)
Sodium: 136 mmol/L (ref 135–145)
Total Bilirubin: 0.5 mg/dL (ref 0.3–1.2)
Total Protein: 7.5 g/dL (ref 6.5–8.1)

## 2021-11-21 MED ORDER — FAMOTIDINE IN NACL 20-0.9 MG/50ML-% IV SOLN
20.0000 mg | Freq: Once | INTRAVENOUS | Status: AC
  Administered 2021-11-21: 20 mg via INTRAVENOUS
  Filled 2021-11-21: qty 50

## 2021-11-21 MED ORDER — DIPHENHYDRAMINE HCL 50 MG/ML IJ SOLN
50.0000 mg | Freq: Once | INTRAMUSCULAR | Status: AC
  Administered 2021-11-21: 50 mg via INTRAVENOUS
  Filled 2021-11-21: qty 1

## 2021-11-21 MED ORDER — SODIUM CHLORIDE 0.9 % IV SOLN
60.0000 mg/m2 | Freq: Once | INTRAVENOUS | Status: AC
  Administered 2021-11-21: 120 mg via INTRAVENOUS
  Filled 2021-11-21: qty 20

## 2021-11-21 MED ORDER — SODIUM CHLORIDE 0.9 % IV SOLN
10.0000 mg | Freq: Once | INTRAVENOUS | Status: AC
  Administered 2021-11-21: 10 mg via INTRAVENOUS
  Filled 2021-11-21: qty 10

## 2021-11-21 MED ORDER — HEPARIN SOD (PORK) LOCK FLUSH 100 UNIT/ML IV SOLN
500.0000 [IU] | Freq: Once | INTRAVENOUS | Status: AC | PRN
  Administered 2021-11-21: 500 [IU]
  Filled 2021-11-21: qty 5

## 2021-11-21 MED ORDER — SODIUM CHLORIDE 0.9 % IV SOLN
Freq: Once | INTRAVENOUS | Status: AC
  Filled 2021-11-21: qty 250

## 2021-11-21 MED ORDER — SODIUM CHLORIDE 0.9 % IV SOLN
200.0000 mg | Freq: Once | INTRAVENOUS | Status: AC
  Administered 2021-11-21: 200 mg via INTRAVENOUS
  Filled 2021-11-21: qty 8

## 2021-11-21 NOTE — Patient Instructions (Signed)
[facility] CANCER CTR AT [place]-[facility]ICAL [facility]COLOGY  Discharge Instructions: Thank you for choosing [facility] to provide your oncology and hematology care.  If you have a lab appointment with the Cancer Center, please go directly to the Cancer Center and check in at the registration area.  Wear comfortable clothing and clothing appropriate for easy access to any Portacath or PICC line.   We strive to give you quality time with your provider. You may need to reschedule your appointment if you arrive late (15 or more minutes).  Arriving late affects you and other patients whose appointments are after yours.  Also, if you miss three or more appointments without notifying the office, you may be dismissed from the clinic at the provider's discretion.      For prescription refill requests, have your pharmacy contact our office and allow 72 hours for refills to be completed.       To help prevent nausea and vomiting after your treatment, we encourage you to take your nausea medication as directed.  BELOW ARE SYMPTOMS THAT SHOULD BE REPORTED IMMEDIATELY: *FEVER GREATER THAN 100.4 F (38 C) OR HIGHER *CHILLS OR SWEATING *NAUSEA AND VOMITING THAT IS NOT CONTROLLED WITH YOUR NAUSEA MEDICATION *UNUSUAL SHORTNESS OF BREATH *UNUSUAL BRUISING OR BLEEDING *URINARY PROBLEMS (pain or burning when urinating, or frequent urination) *BOWEL PROBLEMS (unusual diarrhea, constipation, pain near the anus) TENDERNESS IN MOUTH AND THROAT WITH OR WITHOUT PRESENCE OF ULCERS (sore throat, sores in mouth, or a toothache) UNUSUAL RASH, SWELLING OR PAIN  UNUSUAL VAGINAL DISCHARGE OR ITCHING   Items with * indicate a potential emergency and should be followed up as soon as possible or go to the Emergency Department if any problems should occur.  Please show the CHEMOTHERAPY ALERT CARD or IMMUNOTHERAPY ALERT CARD at check-in to the Emergency Department and triage nurse.  Should you have questions after your  visit or need to cancel or reschedule your appointment, please contact [facility] CANCER CTR AT [place]ICAL ONCOLOGY  [phone] and follow the prompts.  Office hours are 8:00 a.m. to 4:30 p.m. Monday - Friday. Please note that voicemails left after 4:00 p.m. may not be returned until the following business day.  We are closed weekends and major holidays. You have access to a nurse at all times for urgent questions. Please call the main number to the clinic [phone] and follow the prompts.  For any non-urgent questions, you may also contact your provider using MyChart. We now offer e-Visits for anyone 18 and older to request care online for non-urgent symptoms. For details visit [email or web address].   Also download the MyChart app! Go to the app store, search "MyChart", open the app, select [place], and log in with your MyChart username and password.  Masks are optional in the cancer centers. If you would like for your care team to wear a mask while they are taking care of you, please let them know. For doctor visits, patients may have with them one support person who is at least 69 years old. At this time, visitors are not allowed in the infusion area.   

## 2021-11-22 ENCOUNTER — Encounter: Payer: Self-pay | Admitting: Internal Medicine

## 2021-11-22 ENCOUNTER — Ambulatory Visit (INDEPENDENT_AMBULATORY_CARE_PROVIDER_SITE_OTHER): Payer: PPO | Admitting: Internal Medicine

## 2021-11-22 VITALS — BP 136/70 | HR 90 | Temp 98.1°F | Ht 67.0 in | Wt 188.0 lb

## 2021-11-22 DIAGNOSIS — E785 Hyperlipidemia, unspecified: Secondary | ICD-10-CM

## 2021-11-22 DIAGNOSIS — I10 Essential (primary) hypertension: Secondary | ICD-10-CM

## 2021-11-22 DIAGNOSIS — E113559 Type 2 diabetes mellitus with stable proliferative diabetic retinopathy, unspecified eye: Secondary | ICD-10-CM

## 2021-11-22 DIAGNOSIS — G620 Drug-induced polyneuropathy: Secondary | ICD-10-CM | POA: Diagnosis not present

## 2021-11-22 DIAGNOSIS — Z1239 Encounter for other screening for malignant neoplasm of breast: Secondary | ICD-10-CM

## 2021-11-22 NOTE — Assessment & Plan Note (Signed)
Thankfully her neuropathy has not affected her feet, only her hands

## 2021-11-22 NOTE — Assessment & Plan Note (Signed)
Tolerating lovastatin.  LDL is at goal. Recommending exercise and weight loss  for reduction in triglycerides  Lab Results  Component Value Date   CHOL 150 11/18/2021   HDL 38 (L) 11/18/2021   LDLCALC 81 11/18/2021   LDLDIRECT 82.0 06/13/2018   TRIG 223 (H) 11/18/2021   CHOLHDL 3.9 11/18/2021

## 2021-11-22 NOTE — Patient Instructions (Signed)
You are under [place]!  CHANGE NOTHING!   I'LL SEE YOU IN 3 MONTHS   GET YOUR FLU VACCINE IN [place]

## 2021-11-22 NOTE — Progress Notes (Signed)
Subjective:  Patient ID: Ashlee Cox, female    DOB: [birth year]  Age: 71 y.o. MRN: [id number]  CC: The primary encounter diagnosis was Encounter for screening for malignant neoplasm of breast, unspecified screening modality. Diagnoses of Primary hypertension, Type 2 diabetes mellitus with stable proliferative retinopathy, without long-term current use of insulin, unspecified laterality ([place]), Hyperlipidemia with target LDL less than 100, and Drug-induced polyneuropathy ([place]) were also pertinent to this visit.   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   Energy level is fair,  able to attend chr[facility] weekly  and staying active inside the house but avoiding the heat.  Full body scan tomorrow.  Some neuropathy affecting fingertips but still able to crochet and knit. Able to feel heat/cold   2)T2DM :  finally under control.  Taking glipizide and metformin at maximal doses.  Wearing the FreeStyle Libre 3 monitor,  in range 87% of the time.   a1c now 6.5 pvsx up to date  foot exam normal today   3) Hypothyroidism :  aggravated by Beryle Flock ,  taking 100 mcg daily.  Last TSH was drawn after a week of no medication due to miscommunication between pharmacy and provider's office    Outpatient Medications Prior to Visit  Medication Sig Dispense Refill   acetaminophen (TYLENOL) 500 MG tablet Take 500 mg by mouth every 8 (eight) hours as needed for moderate pain.     calcium carbonate (OS-CAL) 600 MG TABS tablet Take 600 mg by mouth daily.     Continuous Blood Gluc Sensor (FREESTYLE LIBRE 3 SENSOR) MISC Apply 1 each topically every 14 (fourteen) days. Place 1 sensor on the skin every 14 days. Use to check glucose continuously 6 each 3   gabapentin (NEURONTIN) 100  MG capsule TAKE 2 CAPSULES BY MOUTH AT BEDTIME 180 capsule 0   glipiZIDE (GLUCOTROL XL) 10 MG 24 hr tablet Take 1 tablet by mouth once daily with breakfast 90 tablet 0   KLOR-CON M20 20 MEQ tablet Take 1 tablet by mouth twice daily 60 tablet 0   levothyroxine (SYNTHROID) 100 MCG tablet TAKE 1 TABLET BY MOUTH ONCE DAILY IN THE MORNING BEFORE BREAKFAST 90 tablet 0   lidocaine-prilocaine (EMLA) cream Apply 1 application topically as needed. Apply to port and cover with saran wrap 1-2 hours prior to port access 30 g 1   losartan-hydrochlorothiazide (HYZAAR) 50-12.5 MG tablet Take 1 tablet by mouth once daily 90 tablet 0   lovastatin (MEVACOR) 40 MG tablet TAKE 1 TABLET BY MOUTH AT BEDTIME 90 tablet 1   magnesium oxide (MAG-OX) 400 (240 Mg) MG tablet Take 400 mg by mouth daily.     metFORMIN (GLUCOPHAGE) 1000 MG tablet TAKE 2 TABLETS BY MOUTH ONCE DAILY WITH BREAKFAST 180 tablet 0   Multiple Vitamin (MULTIVITAMIN) tablet Take 1 tablet by mouth daily.     omeprazole (PRILOSEC) 20 MG capsule Take 1 capsule by mouth once daily 90 capsule 0   Semaglutide,0.25 or 0.5MG/DOS, (OZEMPIC, 0.25 OR 0.5 MG/DOSE,) 2 MG/1.5ML SOPN Inject 0.5 mg into the skin once a week. 1.5 mL 11   vitamin B-12 (CYANOCOBALAMIN) 100 MCG tablet Take 100 mcg by mouth daily.     diphenhydrAMINE HCl, Sleep, (ZZZQUIL PO) Take 1  tablet by mouth at bedtime as needed. (Patient not taking: Reported on 11/22/2021)     Multiple Vitamins-Minerals (MULTI-VITAMIN GUMMIES) CHEW Chew 1 tablet by mouth 1 day or 1 dose. (Patient not taking: Reported on 11/22/2021)     No facility-administered medications prior to visit.    Review of Systems;  Patient denies headache, fevers, malaise, unintentional weight loss, skin rash, eye pain, sinus congestion and sinus pain, sore throat, dysphagia,  hemoptysis , cough, dyspnea, wheezing, chest pain, palpitations, orthopnea, edema, abdominal pain, nausea, melena, diarrhea, constipation, flank pain, dysuria,  hematuria, urinary  Frequency, nocturia, numbness, tingling, seizures,  Focal weakness, Loss of consciousness,  Tremor, insomnia, depression, anxiety, and suicidal ideation.      Objective:  BP 136/70 (BP Location: Left Arm, Patient Position: Sitting, Cuff Size: Large)   Pulse 90   Temp 98.1 F (36.7 C) (Oral)   Ht 5' 7"  (1.702 m)   Wt 188 lb (85.3 kg)   SpO2 97%   BMI 29.44 kg/m   BP Readings from Last 3 Encounters:  11/22/21 136/70  11/21/21 131/70  11/14/21 (!) 115/57    Wt Readings from Last 3 Encounters:  11/22/21 188 lb (85.3 kg)  11/21/21 186 lb 11.7 oz (84.7 kg)  11/14/21 186 lb 6.4 oz (84.6 kg)    General appearance: alert, cooperative and appears stated age Ears: normal TM's and external ear canals both ears Throat: lips, mucosa, and tongue normal; teeth and gums normal Neck: no adenopathy, no carotid bruit, supple, symmetrical, trachea midline and thyroid not enlarged, symmetric, no tenderness/mass/nodules Back: symmetric, no curvature. ROM normal. No CVA tenderness. Lungs: clear to auscultation bilaterally Heart: regular rate and rhythm, S1, S2 normal, no murmur, click, rub or gallop Abdomen: soft, non-tender; bowel sounds normal; no masses,  no organomegaly Pulses: 2+ and symmetric Skin: Skin color, texture, turgor normal. No rashes or lesions Lymph nodes: Cervical, supraclavicular, and axillary nodes normal.  Lab Results  Component Value Date   HGBA1C 6.5 11/18/2021   HGBA1C 9.2 (A) 04/20/2021   HGBA1C 6.8 (A) 10/13/2020    Lab Results  Component Value Date   CREATININE 0.87 11/21/2021   CREATININE 0.82 11/18/2021   CREATININE 0.79 11/14/2021    Lab Results  Component Value Date   WBC 5.9 11/21/2021   HGB 9.7 (L) 11/21/2021   HCT 30.5 (L) 11/21/2021   PLT 254 11/21/2021   GLUCOSE 156 (H) 11/21/2021   CHOL 150 11/18/2021   TRIG 223 (H) 11/18/2021   HDL 38 (L) 11/18/2021   LDLDIRECT 82.0 06/13/2018   LDLCALC 81 11/18/2021   ALT 17  11/21/2021   AST 40 11/21/2021   NA 136 11/21/2021   K 3.8 11/21/2021   CL 104 11/21/2021   CREATININE 0.87 11/21/2021   BUN 11 11/21/2021   CO2 23 11/21/2021   TSH 23.771 (H) 11/14/2021   HGBA1C 6.5 11/18/2021   MICROALBUR 2.0 (H) 11/18/2021    DG HIP UNILAT W OR W/O PELVIS 2-3 VIEWS LEFT  Result Date: 11/15/2021 CLINICAL DATA:  Left-sided hip pain EXAM: DG HIP (WITH OR WITHOUT PELVIS) 2-3V LEFT COMPARISON:  CT 10/21/2021 FINDINGS: SI joints are patent. Pubic symphysis and rami appear intact. No fracture or malalignment. Left hip joint space appears patent. 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 IMPRESSION: Negative. Electronically Signed   By: Donavan Foil M.D.   On: 11/15/2021 18:24    Assessment & Plan:   Problem List Items Addressed This Visit  Drug-induced polyneuropathy ([place])    Thankfully her neuropathy has not affected her feet, only her hands       Hyperlipidemia with target LDL less than 100    Tolerating lovastatin.  LDL is at goal. 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  hemoglobin A1c is at 6.5 . Patient is reminded to schedule an annual eye exam and foot exam is normal today. Patient has no microalbuminuria. Patient is tolerating  Lovastatin for CAD risk reduction  Lab Results  Component Value Date   HGBA1C 6.5 11/18/2021   Lab Results  Component Value Date   MICROALBUR 2.0 (H) 11/18/2021   MICROALBUR 0.9  10/13/2020          Relevant Orders   Comprehensive metabolic panel   Hemoglobin A1c   Other Visit Diagnoses     Encounter for screening for malignant neoplasm of breast, unspecified screening modality    -  Primary   Relevant Orders   MM 3D SCREEN BREAST BILATERAL       I spent a total of   36  minutes with this patient in a face to face visit on the date of this encounter reviewing the last office visit with me in January,  most recent with patient's oncologist, patient's diet and eating habits, home blood sugar readings ,  and post visit ordering of testing and therapeutics.    Follow-up: No follow-ups on file.   [name], MD

## 2021-11-22 NOTE — Assessment & Plan Note (Signed)
Currently well-controlled on current medications .  hemoglobin A1c is at 6.5 . Patient is reminded to schedule an annual eye exam and foot exam is normal today. Patient has no microalbuminuria. Patient is tolerating  Lovastatin for CAD risk reduction  Lab Results  Component Value Date   HGBA1C 6.5 11/18/2021   Lab Results  Component Value Date   MICROALBUR 2.0 (H) 11/18/2021   MICROALBUR 0.9 10/13/2020

## 2021-11-23 ENCOUNTER — Encounter
Admission: RE | Admit: 2021-11-23 | Discharge: 2021-11-23 | Disposition: A | Discharge status: Home / Self Care | Payer: PPO | Source: Ambulatory Visit | Attending: Internal Medicine | Admitting: Internal Medicine

## 2021-11-23 DIAGNOSIS — M25552 Pain in left hip: Secondary | ICD-10-CM | POA: Insufficient documentation

## 2021-11-23 DIAGNOSIS — C50211 Malignant neoplasm of upper-inner quadrant of right female breast: Secondary | ICD-10-CM | POA: Diagnosis not present

## 2021-11-23 DIAGNOSIS — Z171 Estrogen receptor negative status [ER-]: Secondary | ICD-10-CM | POA: Diagnosis not present

## 2021-11-23 DIAGNOSIS — C50911 Malignant neoplasm of unspecified site of right female breast: Secondary | ICD-10-CM | POA: Diagnosis not present

## 2021-11-23 DIAGNOSIS — M170 Bilateral primary osteoarthritis of knee: Secondary | ICD-10-CM | POA: Diagnosis not present

## 2021-11-23 DIAGNOSIS — M160 Bilateral primary osteoarthritis of hip: Secondary | ICD-10-CM | POA: Diagnosis not present

## 2021-11-23 DIAGNOSIS — Z85830 Personal history of malignant neoplasm of bone: Secondary | ICD-10-CM | POA: Diagnosis not present

## 2021-11-23 MED ORDER — TECHNETIUM TC 99M MEDRONATE IV KIT
20.0000 | PACK | Freq: Once | INTRAVENOUS | Status: AC | PRN
  Administered 2021-11-23: 20.02 via INTRAVENOUS

## 2021-11-24 ENCOUNTER — Ambulatory Visit: Payer: Self-pay

## 2021-11-24 NOTE — Patient Outreach (Signed)
  Care Coordination   11/24/2021 Name: MINIYA MIGUEZ MRN: [id number] DOB: Mar 16, 1951   Care Coordination Outreach Attempts:  An unsuccessful telephone outreach was attempted for a scheduled appointment today.  Follow Up Plan:  Additional outreach attempts will be made to offer the patient care coordination information and services.   Encounter Outcome:  No Answer  Care Coordination Interventions Activated:  No   Care Coordination Interventions:  No, not indicated    [name] RN,BSN,CCM RN Care Manager Coordinator [phone]

## 2021-11-29 ENCOUNTER — Other Ambulatory Visit: Payer: PPO

## 2021-11-29 MED FILL — Dexamethasone Sodium Phosphate Inj 100 MG/10ML: INTRAMUSCULAR | Qty: 1 | Status: AC

## 2021-11-30 ENCOUNTER — Inpatient Hospital Stay: Payer: PPO

## 2021-11-30 ENCOUNTER — Encounter: Payer: Self-pay | Admitting: Internal Medicine

## 2021-11-30 ENCOUNTER — Inpatient Hospital Stay: Payer: PPO | Attending: Internal Medicine | Admitting: Internal Medicine

## 2021-11-30 DIAGNOSIS — Z510 Encounter for antineoplastic radiation therapy: Secondary | ICD-10-CM | POA: Diagnosis not present

## 2021-11-30 DIAGNOSIS — Z170 Estrogen receptor positive status [ER+]: Secondary | ICD-10-CM | POA: Diagnosis not present

## 2021-11-30 DIAGNOSIS — C7951 Secondary malignant neoplasm of bone: Secondary | ICD-10-CM | POA: Diagnosis not present

## 2021-11-30 DIAGNOSIS — E1165 Type 2 diabetes mellitus with hyperglycemia: Secondary | ICD-10-CM | POA: Insufficient documentation

## 2021-11-30 DIAGNOSIS — C770 Secondary and unspecified malignant neoplasm of lymph nodes of head, face and neck: Secondary | ICD-10-CM | POA: Diagnosis not present

## 2021-11-30 DIAGNOSIS — G620 Drug-induced polyneuropathy: Secondary | ICD-10-CM | POA: Insufficient documentation

## 2021-11-30 DIAGNOSIS — Z79899 Other long term (current) drug therapy: Secondary | ICD-10-CM | POA: Diagnosis not present

## 2021-11-30 DIAGNOSIS — Z171 Estrogen receptor negative status [ER-]: Secondary | ICD-10-CM

## 2021-11-30 DIAGNOSIS — C50211 Malignant neoplasm of upper-inner quadrant of right female breast: Secondary | ICD-10-CM | POA: Diagnosis not present

## 2021-11-30 DIAGNOSIS — D4861 Neoplasm of uncertain behavior of right breast: Secondary | ICD-10-CM

## 2021-11-30 DIAGNOSIS — Z79890 Hormone replacement therapy (postmenopausal): Secondary | ICD-10-CM | POA: Insufficient documentation

## 2021-11-30 DIAGNOSIS — D509 Iron deficiency anemia, unspecified: Secondary | ICD-10-CM | POA: Diagnosis not present

## 2021-11-30 DIAGNOSIS — E876 Hypokalemia: Secondary | ICD-10-CM | POA: Diagnosis not present

## 2021-11-30 DIAGNOSIS — E878 Other disorders of electrolyte and fluid balance, not elsewhere classified: Secondary | ICD-10-CM | POA: Diagnosis not present

## 2021-11-30 DIAGNOSIS — Z5112 Encounter for antineoplastic immunotherapy: Secondary | ICD-10-CM | POA: Diagnosis not present

## 2021-11-30 LAB — CBC WITH DIFFERENTIAL/PLATELET
Abs Immature Granulocytes: 0.1 10*3/uL — ABNORMAL HIGH (ref 0.00–0.07)
Basophils Absolute: 0 10*3/uL (ref 0.0–0.1)
Basophils Relative: 1 %
Eosinophils Absolute: 0 10*3/uL (ref 0.0–0.5)
Eosinophils Relative: 0 %
HCT: 30.1 % — ABNORMAL LOW (ref 36.0–46.0)
Hemoglobin: 9.7 g/dL — ABNORMAL LOW (ref 12.0–15.0)
Immature Granulocytes: 2 %
Lymphocytes Relative: 18 %
Lymphs Abs: 1 10*3/uL (ref 0.7–4.0)
MCH: 27.6 pg (ref 26.0–34.0)
MCHC: 32.2 g/dL (ref 30.0–36.0)
MCV: 85.8 fL (ref 80.0–100.0)
Monocytes Absolute: 0.7 10*3/uL (ref 0.1–1.0)
Monocytes Relative: 12 %
Neutro Abs: 3.6 10*3/uL (ref 1.7–7.7)
Neutrophils Relative %: 67 %
Platelets: 228 10*3/uL (ref 150–400)
RBC: 3.51 MIL/uL — ABNORMAL LOW (ref 3.87–5.11)
RDW: 22.3 % — ABNORMAL HIGH (ref 11.5–15.5)
WBC: 5.4 10*3/uL (ref 4.0–10.5)
nRBC: 0 % (ref 0.0–0.2)

## 2021-11-30 LAB — COMPREHENSIVE METABOLIC PANEL
ALT: 17 U/L (ref 0–44)
AST: 40 U/L (ref 15–41)
Albumin: 3.8 g/dL (ref 3.5–5.0)
Alkaline Phosphatase: 123 U/L (ref 38–126)
Anion gap: 8 (ref 5–15)
BUN: 10 mg/dL (ref 8–23)
CO2: 24 mmol/L (ref 22–32)
Calcium: 9.5 mg/dL (ref 8.9–10.3)
Chloride: 105 mmol/L (ref 98–111)
Creatinine, Ser: 0.72 mg/dL (ref 0.44–1.00)
GFR, Estimated: >60 mL/min (ref >60)
Glucose, Bld: 193 mg/dL — ABNORMAL HIGH (ref 70–99)
Potassium: 3.6 mmol/L (ref 3.5–5.1)
Sodium: 137 mmol/L (ref 135–145)
Total Bilirubin: 0.7 mg/dL (ref 0.3–1.2)
Total Protein: 7.3 g/dL (ref 6.5–8.1)

## 2021-11-30 MED ORDER — SODIUM CHLORIDE 0.9 % IV SOLN
Freq: Once | INTRAVENOUS | Status: AC
  Filled 2021-11-30: qty 250

## 2021-11-30 MED ORDER — HEPARIN SOD (PORK) LOCK FLUSH 100 UNIT/ML IV SOLN
500.0000 [IU] | Freq: Once | INTRAVENOUS | Status: AC
  Administered 2021-11-30: 500 [IU] via INTRAVENOUS
  Filled 2021-11-30: qty 5

## 2021-11-30 MED ORDER — HYDROCODONE-ACETAMINOPHEN 5-325 MG PO TABS: 1.0000 | ORAL_TABLET | Freq: Three times a day (TID) | ORAL | 0 refills | Status: DC | PRN

## 2021-11-30 MED ORDER — SODIUM CHLORIDE 0.9% FLUSH
10.0000 mL | Freq: Once | INTRAVENOUS | Status: AC
  Administered 2021-11-30: 10 mL via INTRAVENOUS
  Filled 2021-11-30: qty 10

## 2021-11-30 MED ORDER — ZOLEDRONIC ACID 4 MG/100ML IV SOLN
4.0000 mg | Freq: Once | INTRAVENOUS | Status: AC
  Administered 2021-11-30: 4 mg via INTRAVENOUS
  Filled 2021-11-30: qty 100

## 2021-11-30 MED ORDER — SODIUM CHLORIDE 0.9 % IV SOLN
Freq: Once | INTRAVENOUS | Status: DC
  Filled 2021-11-30: qty 250

## 2021-11-30 MED ORDER — SODIUM CHLORIDE 0.9 % IV SOLN
200.0000 mg | Freq: Once | INTRAVENOUS | Status: AC
  Administered 2021-11-30: 200 mg via INTRAVENOUS
  Filled 2021-11-30: qty 200

## 2021-11-30 MED ORDER — HEPARIN SOD (PORK) LOCK FLUSH 100 UNIT/ML IV SOLN
500.0000 [IU] | Freq: Once | INTRAVENOUS | Status: DC | PRN
  Filled 2021-11-30: qty 5

## 2021-11-30 NOTE — Progress Notes (Signed)
Worsening left hip and low back pain, 4/10 pain scale today but is a 10 during the night.  Did not sleep last night due to the pain and requesting medication to help relieve the pain.

## 2021-11-30 NOTE — Progress Notes (Signed)
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 Byrnett.] [id number] Carbo-taxol-AC;  JAN 2022-s/p Lumpectomy & ALND- [ypT1a (11m ) ypN1 (2/11 LN-positive)]; s/p radiation end of April 2022.  #Early June 2022-Xeloda 1000 mg per metered square [2000] 2 weeks on 1 week of 6-8 cycles; July 11th 2022- STARTING cycle #3-cut down the dose to 1500 mg BID[sec to HFS].   # JAN 2023- A. SKIN, RIGHT BREAST; PUNCH BIOPSY: [Dr.Byrnett] - INVASIVE CARCINOMA WITH DERMAL INFILTRATION AND LYMPHATIC INVASION. ER- weak POSISTIVE [10%]; PR/her2 NEG [[facility] 0]; JAN 20th, 2023- IMPRESSION: 1. Although the hypermetabolic right axillary node has resolved since the prior PET, there is a new hypermetabolic right low cervical/supraclavicular node, suspicious for nodal metastasis. 2. Decrease in right breast hypermetabolism with nonspecific overlying skin thickening in the setting of prior lumpectomy. 3. Low-level hypermetabolism within the T2 vertebral body, suspicious for isolated osseous metastasis. This could be confirmed with pre and postcontrast thoracic spine MRI. 4. Hypermetabolic thoracic nodes, slightly progressive. Given progression, nodal metastasis are slightly favored over reactive etiologies. 5. Incidental findings, including: Aortic Atherosclerosis (ICD10-I70.0). Tiny hiatal hernia. Hepatic steatosis with nonspecific caudate lobe enlargemen  Comment:  The carcinoma is  morphologically similar to the post-neoadjuvant mammary  carcinoma seen in the January 2022 right breast wide excision and lymph  Nodes  # FEB 6th-Taxol single agent #1; FEB 13th, 2023- [CPS =15]; Taxol-Keytruda  MAY 2023- CT CHEST:  Since the PET of 04/15/2021, enlargement of the previously described hypermetabolic right low cervical/supraclavicular nodal metastasis. Foci of hyperenhancement within the right breast are new or more well-defined since 02/02/2021, suspicious for progressive residual/recurrent disease. Nonspecific mediastinal nodes, similar to slightly decreased in size;  New sclerosis at the site of a known T2 vertebral body metastasis.   # s/p palliative radiation therapy to her right supraclavicular nodal metastasis as well as T2 vertebral body [30 Gray in 10 fractions ;[5/31 to  6/13].  CONTINUED TAXOL-KEYTRUDA; SEP 6th 2023-bone scan progressive disease.  Discontinue Taxol-Keytruda  #  SEP 2023- SACITUZUMAB GOVITECAN    DIAGNOSIS: Right breast cancer triple negative      Carcinoma of upper-inner quadrant of right breast in female, estrogen receptor negative ([place]  10/07/2019 Initial Diagnosis   Carcinoma of upper-inner quadrant of right breast in female, estrogen receptor negative ([place]   12/04/2019 Genetic Testing   Negative genetic testing. No pathogenic variants identified on the [facility] Common Hereditary Cancers Panel + Skin Cancers Panel. The report date is 12/04/2019.  The Common Hereditary Cancers Panel + Skin Cancers Panel offered by [facility] includes sequencing and/or deletion duplication testing of the following 54 genes: APC*, ATM*, AXIN2, BAP1, BARD1, BMPR1A, BRCA1, BRCA2, BRIP1, CDH1, CDK4, CDKN2A (p14ARF), CDKN2A (p16INK4a), CHEK2, CTNNA1, DICER1*, EPCAM*, GREM1*, HOXB13, KIT, MEN1*, MITF*, MLH1*, MSH2*, MSH3*, MSH6*, MUTYH, NBN, NF1*, NTHL1, PALB2, PDGFRA, PMS2*, POLD1*, POLE, POT1, PTCH1, PTEN*, RAD50, RAD51C, RAD51D, RB1*, RNF43, SDHA*, SDHB, SDHC*, SDHD,  SMAD4, SMARCA4, STK11, SUFU, TP53, TSC1*, TSC2, VHL.    05/07/2020 Cancer Staging   Staging form: Breast, AJCC 8th Edition - Clinical: Stage IIIB (  cT2, cN1, cM0, G3, ER-, PR-, HER2-) - Signed by Cammie Sickle, MD on 05/07/2020   04/18/2021 Cancer Staging   Staging form: Breast, AJCC 8th Edition - Pathologic: Stage IV (pT4d, pN3c, cM1, ER+, PR-, HER2-) - Signed by Cammie Sickle, MD on 04/18/2021 Nuclear grade: G3      HISTORY OF PRESENTING ILLNESS: Accompanied by sister.   Walking independently.  Ashlee Cox 71 y.o.  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  Noticed to have worsening pain of right and left hip- s in the last 2 to 3 weeks.  Worse with ambulation.  Tylenol not helping.  No trauma/falls.  Patient admits to resolution of the breast skin rash.  Complains of mild to moderate fatigue.  Admits to compliance with her Synthroid.  Review of Systems  Constitutional:  Positive for malaise/fatigue. Negative for chills, diaphoresis, fever and weight loss.  HENT:  Negative for nosebleeds and sore throat.   Eyes:  Negative for double vision.  Respiratory:  Negative for cough, hemoptysis, sputum production and wheezing.   Cardiovascular:  Negative for chest pain, palpitations, orthopnea and leg swelling.  Gastrointestinal:  Negative for abdominal pain, blood in stool, constipation and melena.  Genitourinary:  Negative for dysuria, frequency and urgency.  Musculoskeletal:  Positive for back pain and neck pain. Negative for joint pain.  Neurological:  Positive for tingling. Negative for dizziness, focal weakness, weakness and headaches.  Endo/Heme/Allergies:  Does not bruise/bleed easily.  Psychiatric/Behavioral:  Negative for depression. The patient is not nervous/anxious and does not have insomnia.      MEDICAL HISTORY:  Past Medical  History:  Diagnosis Date   Cancer [facility]) 2021   right breast   Cyst, breast    benign   Diabetes mellitus without complication ([place])    Family history of melanoma    Family history of ovarian cancer    GERD (gastroesophageal reflux disease)    H/O: rheumatic fever    History of colonoscopy 2010   done bc of bleeding,  normal , due back in 5 yrs ([place])   Hyperlipidemia    Hypertension    Menopause    at age 35   Personal history of chemotherapy 09/2019   RIGHT  INVASIVE MAMMARY CARCINOMA   Personal history of radiation therapy     SURGICAL HISTORY: Past Surgical History:  Procedure Laterality Date   APPENDECTOMY  2006   BREAST BIOPSY Right 09/22/2019   INVASIVE MAMMARY CARCINOMA   BREAST CYST ASPIRATION Left 1999   BREAST LUMPECTOMY Left 04/23/2020   surgery with NL and SN    BREAST LUMPECTOMY WITH NEEDLE LOCALIZATION Right 04/23/2020   Procedure: BREAST LUMPECTOMY WITH NEEDLE LOCALIZATION;  Surgeon: Robert Bellow, MD;  Location: [facility] ORS;  Service: General;  Laterality: Right;   BREAST LUMPECTOMY WITH SENTINEL LYMPH NODE BIOPSY Right 04/23/2020   Procedure: BREAST LUMPECTOMY WITH SENTINEL LYMPH NODE BX;  Surgeon: Robert Bellow, MD;  Location: [place] [facility];  Service: General;  Laterality: Right;   CHOLECYSTECTOMY  2006   COLONOSCOPY     PORTACATH PLACEMENT Left 10/06/2019   Procedure: INSERTION PORT-A-CATH;  Surgeon: Robert Bellow, MD;  Location: [facility] ORS;  Service: General;  Laterality: Left;   SUBMUCOSAL TATTOO INJECTION Right 10/06/2019   Procedure: Right axillary TATTOO INJECTION;  Surgeon: Robert Bellow, MD;  Location: [facility] ORS;  Service: General;  Laterality: Right;   VAGINAL DELIVERY  x3    SOCIAL HISTORY: Social History   Socioeconomic History   Marital status: Married    Spouse name: Chrissie Noa   Number of children: 3   Years of education: Not on file   Highest education level: Not on file  Occupational History   Occupation: Education officer, museum: [employer]  Tobacco Use   Smoking status: Former    Years: 1.00    Types: Cigarettes    Quit date: 12/26/2005    Years since quitting: 15.9   Smokeless tobacco: Never   Tobacco comments:    smoked for less than 1 years,  1 cig/day  Vaping Use   Vaping Use: Never used  Substance and Sexual Activity   Alcohol use: No   Drug use: No   Sexual activity: Never    Partners: Female  Other Topics Concern   Not on file  Social History Narrative   Widowed, March 2014; remarried.      [employer]; quit smoking 1ppw; no alcohol.       Social Determinants of Health   Financial Resource Strain: Medium Risk (05/25/2021)   Overall Financial Resource Strain (CARDIA)    Difficulty of Paying Living Expenses: Somewhat hard  Food Insecurity: No Food Insecurity (05/18/2021)   Hunger Vital Sign    Worried About Running Out of Food in the Last Year: Never true    Ran Out of Food in the Last Year: Never true  Transportation Needs: No Transportation Needs (05/18/2021)   PRAPARE - Hydrologist (Medical): No    Lack of Transportation (Non-Medical): No  Physical Activity: Inactive (05/18/2021)   Exercise Vital Sign    Days of Exercise per Week: 0 days    Minutes of Exercise per Session: 0 min  Stress: No Stress Concern Present (05/18/2021)   [place]    Feeling of Stress : Not at all  Social Connections: [place] (05/18/2021)   Social Connection and Isolation Panel [NHANES]    Frequency of Communication with Friends and Family: More than three times a week    Frequency of Social Gatherings with Friends and Family: More than three times a week    Attends Religious Services: More than 4 times per year    Active Member of [employer] or Organizations: Yes    Attends Music therapist: More than 4 times per year    Marital Status: Married  Human resources officer Violence: Not At Risk  (05/04/2021)   Humiliation, Afraid, Rape, and Kick questionnaire    Fear of Current or Ex-Partner: No    Emotionally Abused: No    Physically Abused: No    Sexually Abused: No    FAMILY HISTORY: Family History  Problem Relation Age of Onset   Cancer Mother 70       ovarian- died 4-5 years.    Heart disease Mother 42   Diabetes Mother    Stroke Father 5   Diabetes Father    Diabetes Sister    Melanoma Sister        survived   Breast cancer Neg Hx     ALLERGIES:  has No Known Allergies.  MEDICATIONS:  Current Outpatient Medications  Medication Sig Dispense Refill   acetaminophen (TYLENOL) 500 MG tablet Take 500 mg by mouth every 8 (eight) hours as needed for moderate pain.     calcium carbonate (OS-CAL) 600 MG TABS tablet Take 600 mg by mouth  daily.     Continuous Blood Gluc Sensor (FREESTYLE LIBRE 3 SENSOR) MISC Apply 1 each topically every 14 (fourteen) days. Place 1 sensor on the skin every 14 days. Use to check glucose continuously 6 each 3   gabapentin (NEURONTIN) 100 MG capsule TAKE 2 CAPSULES BY MOUTH AT BEDTIME 180 capsule 0   glipiZIDE (GLUCOTROL XL) 10 MG 24 hr tablet Take 1 tablet by mouth once daily with breakfast 90 tablet 0   KLOR-CON M20 20 MEQ tablet Take 1 tablet by mouth twice daily 60 tablet 0   levothyroxine (SYNTHROID) 100 MCG tablet TAKE 1 TABLET BY MOUTH ONCE DAILY IN THE MORNING BEFORE BREAKFAST 90 tablet 0   lidocaine-prilocaine (EMLA) cream Apply 1 application topically as needed. Apply to port and cover with saran wrap 1-2 hours prior to port access 30 g 1   losartan-hydrochlorothiazide (HYZAAR) 50-12.5 MG tablet Take 1 tablet by mouth once daily 90 tablet 0   lovastatin (MEVACOR) 40 MG tablet TAKE 1 TABLET BY MOUTH AT BEDTIME 90 tablet 1   magnesium oxide (MAG-OX) 400 (240 Mg) MG tablet Take 400 mg by mouth daily.     metFORMIN (GLUCOPHAGE) 1000 MG tablet TAKE 2 TABLETS BY MOUTH ONCE DAILY WITH BREAKFAST 180 tablet 0   Multiple Vitamin (MULTIVITAMIN)  tablet Take 1 tablet by mouth daily.     omeprazole (PRILOSEC) 20 MG capsule Take 1 capsule by mouth once daily 90 capsule 0   Semaglutide,0.25 or 0.5MG/DOS, (OZEMPIC, 0.25 OR 0.5 MG/DOSE,) 2 MG/1.5ML SOPN Inject 0.5 mg into the skin once a week. 1.5 mL 11   vitamin B-12 (CYANOCOBALAMIN) 100 MCG tablet Take 100 mcg by mouth daily.     HYDROcodone-acetaminophen (NORCO/VICODIN) 5-325 MG tablet Take 1 tablet by mouth every 8 (eight) hours as needed for moderate pain. 60 tablet 0   No current facility-administered medications for this visit.      Marland Kitchen  PHYSICAL EXAMINATION: ECOG PERFORMANCE STATUS: 0 - Asymptomatic  Vitals:   11/30/21 0923  BP: 118/67  Pulse: 97  Resp: 18  Temp: 98.8 F (37.1 C)     Filed Weights   11/30/21 0923  Weight: 186 lb 11.2 oz (84.7 kg)     Right supraclavicular lymphadenopathy felt. 2-3 cm in size.   Physical Exam HENT:     Head: Normocephalic and atraumatic.     Mouth/Throat:     Pharynx: No oropharyngeal exudate.  Eyes:     Pupils: Pupils are equal, round, and reactive to light.  Cardiovascular:     Rate and Rhythm: Normal rate and regular rhythm.  Pulmonary:     Effort: Pulmonary effort is normal. No respiratory distress.     Breath sounds: Normal breath sounds. No wheezing.  Abdominal:     General: Bowel sounds are normal. There is no distension.     Palpations: Abdomen is soft. There is no mass.     Tenderness: There is no abdominal tenderness. There is no guarding or rebound.  Musculoskeletal:        General: No tenderness. Normal range of motion.     Cervical back: Normal range of motion and neck supple.  Skin:    General: Skin is warm.  Neurological:     Mental Status: She is alert and oriented to person, place, and time.  Psychiatric:        Mood and Affect: Affect normal.      LABORATORY DATA:  I have reviewed the data as listed Lab Results  Component  Value Date   WBC 5.4 11/30/2021   HGB 9.7 (L) 11/30/2021   HCT 30.1  (L) 11/30/2021   MCV 85.8 11/30/2021   PLT 228 11/30/2021   Recent Labs    11/14/21 0849 11/18/21 0846 11/21/21 0832 11/30/21 0921  NA 136 142 136 137  K 3.9 4.7 3.8 3.6  CL 105 102 104 105  CO2 22 22 23 24   GLUCOSE 144* 123* 156* 193*  BUN 11 11 11 10   CREATININE 0.79 0.82 0.87 0.72  CALCIUM 9.5 9.6 9.0 9.5  GFRNONAA >60  --  >60 >60  PROT 7.9 7.0 7.5 7.3  ALBUMIN 3.8 4.1 3.7 3.8  AST 32 27 40 40  ALT 14 13 17 17   ALKPHOS 118 123* 114 123  BILITOT 0.7 0.7 0.5 0.7    RADIOGRAPHIC STUDIES: I have personally reviewed the radiological images as listed and agreed with the findings in the report. NM PET Image Restage (PS) Skull Base to Thigh (F-18 FDG)  Result Date: 12/05/2021 CLINICAL DATA:  Subsequent treatment strategy for breast cancer. EXAM: NUCLEAR MEDICINE PET SKULL BASE TO THIGH TECHNIQUE: 10.1 mCi F-18 FDG was injected intravenously. Full-ring PET imaging was performed from the skull base to thigh after the radiotracer. CT data was obtained and used for attenuation correction and anatomic localization. Fasting blood glucose: 127 mg/dl COMPARISON:  Bone scan 11/23/2021, CT chest abdomen pelvis 10/21/2021 and PET 04/15/2021. FINDINGS: Mediastinal blood pool activity: SUV max 1.6 Liver activity: SUV max NA NECK: No abnormal hypermetabolism. Incidental CT findings: None. CHEST: 1.9 cm right supraclavicular lymph node (2/52), SUV max 8.7, enlarged and increasingly hypermetabolic when compared with prior PET. Patchy consolidation and slight pulmonary retraction in the apical segment right upper lobe, presumably radiation related and with associated mild hypermetabolism. Subpleural nodular consolidation in the medial left upper lobe is new from 10/21/2021 and measures 1.6 x 2.2 cm (2/69), SUV max, 3.3. Three hypermetabolic nodules in right breast measure up to 3.0 x 3.3 cm (2/101), SUV max 8.4. No hypermetabolic mediastinal, hilar, axillary or internal mammary lymph nodes. Incidental CT  findings: Left subclavian Port-A-Cath terminates in the high right atrium. Atherosclerotic calcification of the aorta. Heart is at the upper limits of normal in size to mildly enlarged. No pericardial or pleural effusion. ABDOMEN/PELVIS: No abnormal hypermetabolism in the liver, adrenal glands, spleen or pancreas. No hypermetabolic lymph nodes. Incidental CT findings: Liver is unremarkable. Cholecystectomy. Adrenal glands are unremarkable. Subcentimeter low-attenuation lesion in the right kidney, too small to characterize. No specific follow-up necessary. Kidneys, spleen, pancreas, stomach and bowel are otherwise unremarkable with the exception of a small hiatal hernia. SKELETON: Multiple hypermetabolic osseous lesions, as described on bone scan 11/23/2021. Findings are largely new from PET 04/15/2021. Index new hypermetabolic lesion in the proximal right femur, SUV max 6.4. Incidental CT findings: Degenerative changes in the spine. Chronic bilateral L5 pars defects. IMPRESSION: 1. Three hypermetabolic right breast nodules with a right supraclavicular nodal metastasis and diffuse osseous metastatic disease. 2. Subpleural nodular consolidation in the apical left upper lobe is new from 10/21/2021 and may be infectious/inflammatory in etiology or related to evolving radiation therapy. Please correlate clinically. Recommend attention on follow-up as metastatic disease cannot be definitively excluded. 3.  Aortic atherosclerosis (ICD10-I70.0). Electronically Signed   By: Lorin Picket M.D.   On: 12/05/2021 08:38   NM Bone Scan Whole Body  Result Date: 11/24/2021 CLINICAL DATA:  Bone pain, RIGHT breast cancer, LEFT hip pain EXAM: NUCLEAR MEDICINE WHOLE BODY BONE SCAN TECHNIQUE:  Whole body anterior and posterior images were obtained approximately 3 hours after intravenous injection of radiopharmaceutical. RADIOPHARMACEUTICALS:  20.02 mCi Technetium-71mMDP IV COMPARISON:  None Correlation: CT chest abdomen pelvis  10/21/2021 FINDINGS: Multiple foci of abnormal tracer uptake consistent with osseous metastatic disease. These include distal sternum, thoracic spine, BILATERAL ribs, pelvis, and BILATERAL femora. Uptake within LEFT lateral cervical spine and lower lumbar spine could be degenerative less likely metastatic. Degenerative type uptake of tracer at hips and knees. Questionable metastatic focus LEFT parietal. Nonspecific tracer uptake in the breast bilaterally. Otherwise expected urinary tract and soft tissue distribution of tracer. IMPRESSION: Scattered sites of abnormal osseous tracer uptake as above consistent with osseous metastases, including BILATERAL femora. Electronically Signed   By: MLavonia DanaM.D.   On: 11/24/2021 09:50   DG HIP UNILAT W OR W/O PELVIS 2-3 VIEWS LEFT  Result Date: 11/15/2021 CLINICAL DATA:  Left-sided hip pain EXAM: DG HIP (WITH OR WITHOUT PELVIS) 2-3V LEFT COMPARISON:  CT 10/21/2021 FINDINGS: SI joints are patent. Pubic symphysis and rami appear intact. No fracture or malalignment. Left hip joint space appears patent. 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 IMPRESSION: Negative. Electronically Signed   By: KDonavan FoilM.D.   On: 11/15/2021 18:24    ASSESSMENT & PLAN:   Carcinoma of upper-inner quadrant of right breast in female, estrogen receptor negative ([place] #Recurrent right breast cancer-s/p breast skin biopsy [FEB 2023- ER positive /PR HER2-0; however 2017-ER-NEGATIVE; -HER2 NEU-1+/LOW]; - recurrent- stage IV; NGS -FEB 2023- CPS =15%. Currently on Keytruda- taxol [2/13]; JULY 28th, 2023-  Enlarging soft tissue lesions in the right breast; Stable cutaneous thickening in the right breast;  Stable sclerotic lesion in the T2 vertebral body likely a previous metastatic lesion. JULY 2023- CT neck-  The right level V lymph node measuring 2.0 cm x 2.4 cm x 1.8 cm is increased in size in the AP dimension, previously measuring 2.4 cm x 1.3 cm by  1.7 cm. There is no other pathologic lymphadenopathy in the neck.  However bone scan November 24, 2021- Scattered sites of abnormal osseous tracer uptake  consistent with osseous metastases, including BILATERAL femora.  Given the progressive disease discontinue Taxol Keytruda.   # Plan proceeding with chemotherapy- sacituzumab govitecan- days 1 &8 q 21 days.  Understands treatments are palliative not curative. Will check UGTA.   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  # Left Knee- Bil-likely secondary [facility]- s/p aspiration; s/p steroid injection-  STABLE.  # T2 thoracic metastatic disease [confirmed MRI Jan 2023]-currently s/p Radiation [ 6/13-last] S/p  Zometa q4 W- STABLE.  Discussed with Dr. CDonella Stade  Recommended getting a PET scan for further evaluation.  Will refer to Dr. CDonella Stadefor further evaluation  # HYPO [place]emia potassium 3.2 continue K-Dur 20 mg twice a day recommend inreasing fluid intake.-STABLE today 4.0.   # PN-1-2 sec to taxol-on Neurontin 200 qhs-STABLE.   # DM/poorly controlled- continue metformin 2000 mg q AM [Dr.Tullo]; on glipizide 10 mg XL. Continue follow up with  PCP.  On CGM.- FBG- 144-STABLE   # Iron def anemia-[Iron studies- July 2023] continue Venofer-.   # Mediport: Functioning.   # I reviewed the blood work- with the patient in detail; also reviewed the imaging independently [as summarized above]; and with the patient in detail.    #  DISPOSITION:  # NO Taxol today  # Proceed with Venofer- today-   # Zometa today- wait for labs  # Follow up  TBD-  Dr.B ------------------  Please Schedule PET ASAP- follow up in 1-2 days after the PET scan- NO chemo; labs- cbc/cmp; UGTA/[facility] ordered.       All questions were answered. The patient/family knows to call the clinic with any problems, questions or  concerns.   Cammie Sickle, MD 12/05/2021 9:34 PM

## 2021-11-30 NOTE — Progress Notes (Signed)
DISCONTINUE ON PATHWAY REGIMEN - Breast     Pembrolizumab: A cycle is every 21 days:     Pembrolizumab    Chemotherapy: A cycle is every 28 days:     Paclitaxel   **Always confirm dose/schedule in your pharmacy ordering system**  REASON: Disease Progression PRIOR TREATMENT: [id number]: Pembrolizumab 200 mg q21 Days + Paclitaxel 90 mg/m2 D1, 8, 15 q28 Days TREATMENT RESPONSE: Progressive Disease (PD)  START ON PATHWAY REGIMEN - Breast     A cycle is every 21 days:     Sacituzumab govitecan-hziy   **Always confirm dose/schedule in your pharmacy ordering system**  Patient Characteristics: Distant Metastases or Locoregional Recurrent Disease - Unresected or Locally Advanced Unresectable Disease Progressing after Neoadjuvant and Local Therapies, ER Negative/Unknown, Chemotherapy, HER2 Low, Second Line Therapeutic Status: Distant Metastases HER2 Status: Low ER Status: Negative (-) PR Status: Negative (-) Therapy Approach Indicated: Standard Chemotherapy/Endocrine Therapy Line of Therapy: Second Line Intent of Therapy: Non-Curative / Palliative Intent, Discussed with Patient

## 2021-11-30 NOTE — Assessment & Plan Note (Addendum)
#  Recurrent right breast cancer-s/p breast skin biopsy [FEB 2023- ER positive /PR HER2-0; however 2017-ER-NEGATIVE; -HER2 NEU-1+/LOW]; - recurrent- stage IV; NGS -FEB 2023- CPS =15%. Currently on Keytruda- taxol [2/13]; JULY 28th, 2023-  Enlarging soft tissue lesions in the right breast; Stable cutaneous thickening in the right breast;  Stable sclerotic lesion in the T2 vertebral body likely a previous metastatic lesion. JULY 2023- CT neck-  The right level V lymph node measuring 2.0 cm x 2.4 cm x 1.8 cm is increased in size in the AP dimension, previously measuring 2.4 cm x 1.3 cm by 1.7 cm. There is no other pathologic lymphadenopathy in the neck.  However bone scan November 24, 2021- Scattered sites of abnormal osseous tracer uptake  consistent with osseous metastases, including BILATERAL femora.  Given the progressive disease discontinue Taxol Keytruda.   # Plan proceeding with chemotherapy- sacituzumab govitecan- days 1 &8 q 21 days.  Understands treatments are palliative not curative. Will check UGTA.   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  # T2 thoracic metastatic disease [confirmed MRI Jan 2023]-currently s/p Radiation [ 6/13-last] S/p  Zometa q4 W- STABLE.  Discussed with Dr. Donella Stade.  Recommended getting a PET scan for further evaluation.  Will refer to Dr. Donella Stade for further evaluation  # HYPO Kalemia potassium 3.2 continue K-Dur 20 mg twice a day recommend inreasing fluid intake.-STABLE today 4.0.   # PN-1-2 sec to taxol-on Neurontin 200 qhs-STABLE.   # DM/poorly controlled- continue metformin 2000 mg q AM [Dr.Tullo]; on glipizide 10 mg XL. Continue follow up with  PCP.  On CGM.- FBG- 144-STABLE   # Iron def anemia-[Iron studies- July 2023] continue  Venofer-.   # Mediport: Functioning.   # I reviewed the blood work- with the patient in detail; also reviewed the imaging independently [as summarized above]; and with the patient in detail.    # DISPOSITION:  # NO Taxol today  # Proceed with Venofer- today-   # Zometa today- wait for labs  # Follow up  TBD-  Dr.B ------------------  Please Schedule PET ASAP- follow up in 1-2 days after the PET scan- NO chemo; labs- cbc/cmp; UGTA/misc lab corp ordered.

## 2021-11-30 NOTE — Patient Instructions (Signed)
[facility] AT [place]  Discharge Instructions: Thank you for choosing [facility] to provide your oncology and hematology care.  If you have a lab appointment with the [facility], please go directly to the [place] and check in at the registration area.  Wear comfortable clothing and clothing appropriate for easy access to any Portacath or PICC line.   We strive to give you quality time with your provider. You may need to reschedule your appointment if you arrive late (15 or more minutes).  Arriving late affects you and other patients whose appointments are after yours.  Also, if you miss three or more appointments without notifying the office, you may be dismissed from the clinic at the provider's discretion.      For prescription refill requests, have your pharmacy contact our office and allow 72 hours for refills to be completed.    Today you received the following chemotherapy and/or immunotherapy agents ZOMETA and VENOFER      To help prevent nausea and vomiting after your treatment, we encourage you to take your nausea medication as directed.  BELOW ARE SYMPTOMS THAT SHOULD BE REPORTED IMMEDIATELY: *FEVER GREATER THAN 100.4 F (38 C) OR HIGHER *CHILLS OR SWEATING *NAUSEA AND VOMITING THAT IS NOT CONTROLLED WITH YOUR NAUSEA MEDICATION *UNUSUAL SHORTNESS OF BREATH *UNUSUAL BRUISING OR BLEEDING *URINARY PROBLEMS (pain or burning when urinating, or frequent urination) *BOWEL PROBLEMS (unusual diarrhea, constipation, pain near the anus) TENDERNESS IN MOUTH AND THROAT WITH OR WITHOUT PRESENCE OF ULCERS (sore throat, sores in mouth, or a toothache) UNUSUAL RASH, SWELLING OR PAIN  UNUSUAL VAGINAL DISCHARGE OR ITCHING   Items with * indicate a potential emergency and should be followed up as soon as possible or go to the Emergency Department if any problems should occur.  Please show the CHEMOTHERAPY ALERT CARD or IMMUNOTHERAPY ALERT CARD at  check-in to the Emergency Department and triage nurse.  Should you have questions after your visit or need to cancel or reschedule your appointment, please contact [facility] [place] AT [place]  [phone] and follow the prompts.  Office hours are 8:00 a.m. to 4:30 p.m. Monday - Friday. Please note that voicemails left after 4:00 p.m. may not be returned until the following business day.  We are closed weekends and major holidays. You have access to a nurse at all times for urgent questions. Please call the main number to the clinic [phone] and follow the prompts.  For any non-urgent questions, you may also contact your provider using MyChart. We now offer e-Visits for anyone 45 and older to request care online for non-urgent symptoms. For details visit [email or web address].   Also download the MyChart app! Go to the app store, search "MyChart", open the app, select [place], and log in with your MyChart username and password.  Masks are optional in the cancer centers. If you would like for your care team to wear a mask while they are taking care of you, please let them know. For doctor visits, patients may have with them one support person who is at least 69 years old. At this time, visitors are not allowed in the infusion area.  Zoledronic Acid Injection (Cancer) What is this medication? ZOLEDRONIC ACID (ZOE le dron ik AS id) treats high calcium levels in the blood caused by cancer. It may also be used with chemotherapy to treat weakened bones caused by cancer. It works by slowing down the release of calcium from bones. This lowers calcium  levels in your blood. It also makes your bones stronger and less likely to break (fracture). It belongs to a group of medications called bisphosphonates. This medicine may be used for other purposes; ask your health care provider or pharmacist if you have questions. COMMON BRAND NAME(S): Zometa, Zometa Powder What should I tell my care  team before I take this medication? They need to know if you have any of these conditions: Dehydration Dental disease Kidney disease Liver disease Low levels of calcium in the blood Lung or breathing disease, such as asthma Receiving steroids, such as dexamethasone or prednisone An unusual or allergic reaction to zoledronic acid, other medications, foods, dyes, or preservatives Pregnant or trying to get pregnant Breast-feeding How should I use this medication? This medication is injected into a vein. It is given by your care team in a hospital or clinic setting. Talk to your care team about the use of this medication in children. Special care may be needed. Overdosage: If you think you have taken too much of this medicine contact a poison control center or emergency room at once. NOTE: This medicine is only for you. Do not share this medicine with others. What if I miss a dose? Keep appointments for follow-up doses. It is important not to miss your dose. Call your care team if you are unable to keep an appointment. What may interact with this medication? Certain antibiotics given by injection Diuretics, such as bumetanide, furosemide NSAIDs, medications for pain and inflammation, such as ibuprofen or naproxen Teriparatide Thalidomide This list may not describe all possible interactions. Give your health care provider a list of all the medicines, herbs, non-prescription drugs, or dietary supplements you use. Also tell them if you smoke, drink alcohol, or use illegal drugs. Some items may interact with your medicine. What should I watch for while using this medication? Visit your care team for regular checks on your progress. It may be some time before you see the benefit from this medication. Some people who take this medication have severe bone, joint, or muscle pain. This medication may also increase your risk for jaw problems or a broken thigh bone. Tell your care team right away if you  have severe pain in your jaw, bones, joints, or muscles. Tell you care team if you have any pain that does not go away or that gets worse. Tell your dentist and dental surgeon that you are taking this medication. You should not have major dental surgery while on this medication. See your dentist to have a dental exam and fix any dental problems before starting this medication. Take good care of your teeth while on this medication. Make sure you see your dentist for regular follow-up appointments. You should make sure you get enough calcium and vitamin D while you are taking this medication. Discuss the foods you eat and the vitamins you take with your care team. Check with your care team if you have severe diarrhea, nausea, and vomiting, or if you sweat a lot. The loss of too much body fluid may make it dangerous for you to take this medication. You may need bloodwork while taking this medication. Talk to your care team if you wish to become pregnant or think you might be pregnant. This medication can cause serious birth defects. What side effects may I notice from receiving this medication? Side effects that you should report to your care team as soon as possible: Allergic reactions--skin rash, itching, hives, swelling of the face, lips, tongue,  or throat Kidney injury--decrease in the amount of urine, swelling of the ankles, hands, or feet Low calcium level--muscle pain or cramps, confusion, tingling, or numbness in the hands or feet Osteonecrosis of the jaw--pain, swelling, or redness in the mouth, numbness of the jaw, poor healing after dental work, unusual discharge from the mouth, visible bones in the mouth Severe bone, joint, or muscle pain Side effects that usually do not require medical attention (report to your care team if they continue or are bothersome): Constipation Fatigue Fever Loss of appetite Nausea Stomach pain This list may not describe all possible side effects. Call your  doctor for medical advice about side effects. You may report side effects to FDA at 1-800-FDA-1088. Where should I keep my medication? This medication is given in a hospital or clinic. It will not be stored at home. NOTE: This sheet is a summary. It may not cover all possible information. If you have questions about this medicine, talk to your doctor, pharmacist, or health care provider.  2023 Elsevier/Gold Standard (2021-04-28 00:00:00)  Iron Sucrose Injection What is this medication? IRON SUCROSE (EYE ern SOO krose) treats low levels of iron (iron deficiency anemia) in people with kidney disease. Iron is a mineral that plays an important role in making red blood cells, which carry oxygen from your lungs to the rest of your body. This medicine may be used for other purposes; ask your health care provider or pharmacist if you have questions. COMMON BRAND NAME(S): Venofer What should I tell my care team before I take this medication? They need to know if you have any of these conditions: Anemia not caused by low iron levels Heart disease High levels of iron in the blood Kidney disease Liver disease An unusual or allergic reaction to iron, other medications, foods, dyes, or preservatives Pregnant or trying to get pregnant Breast-feeding How should I use this medication? This medication is for infusion into a vein. It is given in a hospital or clinic setting. Talk to your care team about the use of this medication in children. While this medication may be prescribed for children as young as 2 years for selected conditions, precautions do apply. Overdosage: If you think you have taken too much of this medicine contact a poison control center or emergency room at once. NOTE: This medicine is only for you. Do not share this medicine with others. What if I miss a dose? It is important not to miss your dose. Call your care team if you are unable to keep an appointment. What may interact with  this medication? Do not take this medication with any of the following: Deferoxamine Dimercaprol Other iron products This medication may also interact with the following: Chloramphenicol Deferasirox This list may not describe all possible interactions. Give your health care provider a list of all the medicines, herbs, non-prescription drugs, or dietary supplements you use. Also tell them if you smoke, drink alcohol, or use illegal drugs. Some items may interact with your medicine. What should I watch for while using this medication? Visit your care team regularly. Tell your care team if your symptoms do not start to get better or if they get worse. You may need blood work done while you are taking this medication. You may need to follow a special diet. Talk to your care team. Foods that contain iron include: whole grains/cereals, dried fruits, beans, or peas, leafy green vegetables, and organ meats (liver, kidney). What side effects may I notice from receiving this  medication? Side effects that you should report to your care team as soon as possible: Allergic reactions--skin rash, itching, hives, swelling of the face, lips, tongue, or throat Low blood pressure--dizziness, feeling faint or lightheaded, blurry vision Shortness of breath Side effects that usually do not require medical attention (report to your care team if they continue or are bothersome): Flushing Headache Joint pain Muscle pain Nausea Pain, redness, or irritation at injection site This list may not describe all possible side effects. Call your doctor for medical advice about side effects. You may report side effects to FDA at 1-800-FDA-1088. Where should I keep my medication? This medication is given in a hospital or clinic and will not be stored at home. NOTE: This sheet is a summary. It may not cover all possible information. If you have questions about this medicine, talk to your doctor, pharmacist, or health care  provider.  2023 Elsevier/Gold Standard (2007-05-04 00:00:00)

## 2021-12-01 ENCOUNTER — Other Ambulatory Visit: Payer: Self-pay

## 2021-12-02 ENCOUNTER — Ambulatory Visit: Payer: Self-pay

## 2021-12-02 ENCOUNTER — Encounter
Admission: RE | Admit: 2021-12-02 | Discharge: 2021-12-02 | Disposition: A | Discharge status: Home / Self Care | Payer: PPO | Source: Ambulatory Visit | Attending: Internal Medicine | Admitting: Internal Medicine

## 2021-12-02 DIAGNOSIS — C50911 Malignant neoplasm of unspecified site of right female breast: Secondary | ICD-10-CM | POA: Insufficient documentation

## 2021-12-02 DIAGNOSIS — C50919 Malignant neoplasm of unspecified site of unspecified female breast: Secondary | ICD-10-CM | POA: Diagnosis not present

## 2021-12-02 DIAGNOSIS — C50211 Malignant neoplasm of upper-inner quadrant of right female breast: Secondary | ICD-10-CM | POA: Diagnosis not present

## 2021-12-02 DIAGNOSIS — C7951 Secondary malignant neoplasm of bone: Secondary | ICD-10-CM | POA: Insufficient documentation

## 2021-12-02 DIAGNOSIS — Z171 Estrogen receptor negative status [ER-]: Secondary | ICD-10-CM | POA: Insufficient documentation

## 2021-12-02 LAB — GLUCOSE, CAPILLARY: Glucose-Capillary: 127 mg/dL — ABNORMAL HIGH (ref 70–99)

## 2021-12-02 MED ORDER — FLUDEOXYGLUCOSE F - 18 (FDG) INJECTION
10.1300 | Freq: Once | INTRAVENOUS | Status: AC | PRN
  Administered 2021-12-02: 10.13 via INTRAVENOUS

## 2021-12-02 NOTE — Patient Outreach (Signed)
  Care Coordination   Follow Up Visit Note   12/02/2021 Name: Ashlee Cox MRN: [id number] DOB: 11-Sep-1950  Ashlee Cox is a 71 y.o. year old female who sees Derrel Nip, Aris Everts, MD for primary care. I spoke with  [name] by phone today.  What matters to the patients health and wellness today?  Patient reports having follow up visit with oncologist on 11/30/21. Patient states oncologist concerned that cancer may have spread to her hip area.   She reports having ongoing hip pain that affects her sleep.  Patient states she is scheduled to have a PET scan today and from the results the doctor will adjust her treatment plan.      Goals Addressed             This Visit's Progress    Patient Stated: " I want to be cancer free and maintain my quality of life"       Care Coordination Interventions: Evaluation of current treatment plan related to breast cancer and patient's adherence to plan as established by provider Reviewed medications with patient and discussed importance of compliance Reviewed scheduled/upcoming provider appointments  Discussed plans with patient for ongoing care management follow up and provided patient with direct contact information for care management team Discussed with patient importance of proper nutrition while undergoing cancer treatment Discussed with patient importance of remaining hydrated Discussed with patient importance of getting proper rest Sent patient education article on Managing Cancer related fatigue through MyChart          SDOH assessments and interventions completed:  No     Care Coordination Interventions Activated:  Yes  Care Coordination Interventions:  Yes, provided   Follow up plan: Follow up call scheduled for 01/06/22 at 3:00pm    Encounter Outcome:  Pt. Visit Completed   [name] RN,BSN,CCM [place] [phone] direct line

## 2021-12-02 NOTE — Patient Instructions (Addendum)
Visit Information  Thank you for taking time to visit with me today. Please don't hesitate to contact me if I can be of assistance to you.   Following are the goals we discussed today:   Goals Addressed             This Visit's Progress    Patient Stated: " I want to be cancer free and maintain my quality of life"       Care Coordination Interventions: Evaluation of current treatment plan related to breast cancer and patient's adherence to plan as established by provider Reviewed medications with patient and discussed importance of compliance Reviewed scheduled/upcoming provider appointments  Discussed plans with patient for ongoing care management follow up and provided patient with direct contact information for care management team Discussed with patient importance of proper nutrition while undergoing cancer treatment Discussed with patient importance of remaining hydrated Discussed with patient importance of getting proper rest Sent patient education article on Managing Cancer related fatigue through [facility] next appointment is by telephone on 01/06/22  at 3:00  Please call the care guide team at [phone] if you need to cancel or reschedule your appointment.   If you are experiencing a Mental Health or [place] or need someone to talk to, please call 1-800-273-TALK (toll free, 24 hour hotline)  Patient verbalizes understanding of instructions and care plan provided today and agrees to view in [place]. Active MyChart status and patient understanding of how to access instructions and care plan via MyChart confirmed with patient.     [name] RN,BSN,CCM [facility] Care Coordination [phone] direct line   Managing Cancer-Related Fatigue  Fatigue, or feeling very tired, is a common problem when you have cancer. Fatigue comes from the cancer itself and the treatments that you are given, such as chemotherapy and radiation. It can make you  feel weak and drained of energy even after a small amount of activity. Unlike the fatigue that healthy people feel, cancer-related fatigue may not go away with sleep or rest. Fatigue is usually worse during treatment, but survivors may continue to feel some fatigue after treatment ends. There are several things you can do to help manage your fatigue. How to prevent cancer-related fatigue Cancer treatment, low blood counts, appetite changes, and changes in sleep routines can all cause fatigue. Here are some ways to reduce or prevent fatigue: Eat healthy and drink enough fluid to stay hydrated. Talk with a nutritionist or dietitian who can help you plan the best diet for you. Stay active and get regular exercise. Check with your cancer care team first to make sure this is safe. Get plenty of rest and take it easy. Try taking a few short naps or rest breaks throughout the day. Tell your health care provider about any cancer-related side effects, including fatigue. He or she may be able to adjust your medicines or treatment to reduce your fatigue. Follow these instructions at home: Lifestyle  Exercise regularly. Aim for 30 minutes of moderate exercise 5 times a week, or 60 minutes 3 times a week. Examples of moderate exercise include walking, biking, or swimming. Ask your health care provider what activities are safe for you. Remember to pace yourself and take short rest breaks. Find an activity or hobby that you enjoy, and spend 20-30 minutes on it a few times a week. This can help improve your mood, your focus, and your overall mental health. Get plenty of  rest and keep a regular sleep routine. Go to bed at the same time every night. Only use your bed for sleep. Avoid noise during sleep. Take short naps or rest breaks during the day. Do not nap for more than 20-30 minutes. Try to lower your stress. If you need help with this, talk with your health care provider about ways to manage stress. Here are  some things you can try: Do mind and body exercises such as meditation, yoga, tai chi, or qigong. Write in a journal. Make a to-do list and prioritize only the important tasks. Eating and drinking  Eat a healthy diet that includes fresh fruits and vegetables, lean proteins, healthy fats, low-fat dairy products, and whole grains. Avoid simple, refined sugars, such as soft drinks and sweets. Try eating small meals and snacks throughout the day. A piece of fruit and some nuts are good choices for snacking. Try to drink at least 8 glasses of water every day. Limit the amount of alcohol and caffeine that you drink. Medicines Talk with your health care provider about prescription medicines and over-the-counter vitamins and supplements that you take. Where to find support Your health care provider or cancer care team will be able to give you advice about how to cope with fatigue. They can also guide you in making health and lifestyle changes that may help you feel less tired. Your health care provider may also refer you to a therapist for counseling. Talking with a therapist may help you reduce fatigue by working through any stresses or fears that you may have. Where to find more information There are many trusted resources that can help you learn more about cancer and its side effects. Ask your health care provider what he or she recommends. You can start with: The American Cancer Society: www.cancer.org The American Society of Clinical Oncology: www.cancer.net [employer]: www.cancer.gov Contact a health care provider if: You feel depressed. Your fatigue gets worse or does not go away with sleep or rest. You are unable to sleep at night. You are thinking about changing your medicines or treatment. You would like to make changes to your diet or are thinking about taking dietary supplements. Get help right away if: You feel short of breath, feel dizzy, or have a racing  heartbeat after little activity. You become confused or you are not able to think clearly. Summary Fatigue is the most common side effect of cancer treatments like chemotherapy and radiation. Unlike the fatigue that healthy people feel, cancer-related fatigue may not go away with sleep and rest alone. There are several things you can do to help manage your fatigue, including eating a healthy diet, getting exercise, and keeping a regular sleep routine. Your health care provider or cancer care team will be able to give you advice about how to cope with fatigue. They can also guide you in making health and lifestyle changes that may help you feel less tired. This information is not intended to replace advice given to you by your health care provider. Make sure you discuss any questions you have with your health care provider. Document Revised: 02/13/2019 Document Reviewed: 02/13/2019 Elsevier Patient Education  [email or web address] pm

## 2021-12-05 ENCOUNTER — Encounter: Payer: Self-pay | Admitting: Internal Medicine

## 2021-12-06 ENCOUNTER — Encounter: Payer: Self-pay | Admitting: Internal Medicine

## 2021-12-06 ENCOUNTER — Inpatient Hospital Stay (HOSPITAL_BASED_OUTPATIENT_CLINIC_OR_DEPARTMENT_OTHER): Payer: PPO | Admitting: Internal Medicine

## 2021-12-06 ENCOUNTER — Inpatient Hospital Stay: Payer: PPO

## 2021-12-06 ENCOUNTER — Ambulatory Visit
Admission: RE | Admit: 2021-12-06 | Discharge: 2021-12-06 | Disposition: A | Discharge status: Home / Self Care | Payer: PPO | Source: Ambulatory Visit | Attending: Radiation Oncology | Admitting: Radiation Oncology

## 2021-12-06 ENCOUNTER — Telehealth: Payer: Self-pay

## 2021-12-06 VITALS — BP 120/69 | HR 91 | Temp 97.8°F | Resp 18 | Ht 67.0 in | Wt 182.1 lb

## 2021-12-06 DIAGNOSIS — C50211 Malignant neoplasm of upper-inner quadrant of right female breast: Secondary | ICD-10-CM | POA: Insufficient documentation

## 2021-12-06 DIAGNOSIS — Z171 Estrogen receptor negative status [ER-]: Secondary | ICD-10-CM

## 2021-12-06 DIAGNOSIS — C7951 Secondary malignant neoplasm of bone: Secondary | ICD-10-CM | POA: Insufficient documentation

## 2021-12-06 DIAGNOSIS — D4861 Neoplasm of uncertain behavior of right breast: Secondary | ICD-10-CM

## 2021-12-06 DIAGNOSIS — Z5112 Encounter for antineoplastic immunotherapy: Secondary | ICD-10-CM | POA: Diagnosis not present

## 2021-12-06 DIAGNOSIS — C771 Secondary and unspecified malignant neoplasm of intrathoracic lymph nodes: Secondary | ICD-10-CM | POA: Insufficient documentation

## 2021-12-06 DIAGNOSIS — C50919 Malignant neoplasm of unspecified site of unspecified female breast: Secondary | ICD-10-CM

## 2021-12-06 DIAGNOSIS — C50911 Malignant neoplasm of unspecified site of right female breast: Secondary | ICD-10-CM | POA: Diagnosis not present

## 2021-12-06 LAB — CBC WITH DIFFERENTIAL/PLATELET
Abs Immature Granulocytes: 0.1 10*3/uL — ABNORMAL HIGH (ref 0.00–0.07)
Basophils Absolute: 0 10*3/uL (ref 0.0–0.1)
Basophils Relative: 1 %
Eosinophils Absolute: 0 10*3/uL (ref 0.0–0.5)
Eosinophils Relative: 0 %
HCT: 30.4 % — ABNORMAL LOW (ref 36.0–46.0)
Hemoglobin: 9.7 g/dL — ABNORMAL LOW (ref 12.0–15.0)
Immature Granulocytes: 2 %
Lymphocytes Relative: 12 %
Lymphs Abs: 0.9 10*3/uL (ref 0.7–4.0)
MCH: 26.9 pg (ref 26.0–34.0)
MCHC: 31.9 g/dL (ref 30.0–36.0)
MCV: 84.4 fL (ref 80.0–100.0)
Monocytes Absolute: 0.5 10*3/uL (ref 0.1–1.0)
Monocytes Relative: 7 %
Neutro Abs: 5.3 10*3/uL (ref 1.7–7.7)
Neutrophils Relative %: 78 %
Platelets: 282 10*3/uL (ref 150–400)
RBC: 3.6 MIL/uL — ABNORMAL LOW (ref 3.87–5.11)
RDW: 21.8 % — ABNORMAL HIGH (ref 11.5–15.5)
WBC: 6.9 10*3/uL (ref 4.0–10.5)
nRBC: 0 % (ref 0.0–0.2)

## 2021-12-06 LAB — COMPREHENSIVE METABOLIC PANEL
ALT: 17 U/L (ref 0–44)
AST: 38 U/L (ref 15–41)
Albumin: 3.4 g/dL — ABNORMAL LOW (ref 3.5–5.0)
Alkaline Phosphatase: 190 U/L — ABNORMAL HIGH (ref 38–126)
Anion gap: 10 (ref 5–15)
BUN: 12 mg/dL (ref 8–23)
CO2: 21 mmol/L — ABNORMAL LOW (ref 22–32)
Calcium: 8.8 mg/dL — ABNORMAL LOW (ref 8.9–10.3)
Chloride: 104 mmol/L (ref 98–111)
Creatinine, Ser: 0.99 mg/dL (ref 0.44–1.00)
GFR, Estimated: >60 mL/min (ref >60)
Glucose, Bld: 189 mg/dL — ABNORMAL HIGH (ref 70–99)
Potassium: 3.8 mmol/L (ref 3.5–5.1)
Sodium: 135 mmol/L (ref 135–145)
Total Bilirubin: 0.8 mg/dL (ref 0.3–1.2)
Total Protein: 7.8 g/dL (ref 6.5–8.1)

## 2021-12-06 LAB — TSH: TSH: 20.472 u[IU]/mL — ABNORMAL HIGH (ref 0.350–4.500)

## 2021-12-06 MED ORDER — HYDROCODONE-ACETAMINOPHEN 5-325 MG PO TABS: 1.0000 | ORAL_TABLET | Freq: Four times a day (QID) | ORAL | 0 refills | Status: DC | PRN

## 2021-12-06 MED ORDER — ONDANSETRON HCL 8 MG PO TABS: ORAL_TABLET | ORAL | 1 refills | Status: AC

## 2021-12-06 MED ORDER — PROCHLORPERAZINE MALEATE 10 MG PO TABS: 10.0000 mg | ORAL_TABLET | Freq: Four times a day (QID) | ORAL | 1 refills | Status: AC | PRN

## 2021-12-06 MED ORDER — DIPHENOXYLATE-ATROPINE 2.5-0.025 MG PO TABS: 1.0000 | ORAL_TABLET | Freq: Four times a day (QID) | ORAL | 0 refills | Status: AC | PRN

## 2021-12-06 MED ORDER — FENTANYL 25 MCG/HR TD PT72: 1.0000 | MEDICATED_PATCH | TRANSDERMAL | 0 refills | Status: DC

## 2021-12-06 NOTE — Progress Notes (Signed)
Radiation Oncology Follow up Note  Name: Ashlee Cox   Date:   12/06/2021 MRN:  [id number] DOB: March 25, 1951    This 71 y.o. 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  Her initial stage was to be triple negative (T2 N1 M0 invasive mammary carcinoma.  She also received radiation therapy to her right breast back in January 22.  She has gone on to develop metastatic disease.  She is recently complaining of increasing left hip pain bone scan as well as PET CT scan shows metastatic disease within the left SI joint left femur and acetabular region consistent with metastatic disease.  She is currently on narcotic analgesics.  She is seen today for consideration of palliative radiation therapy to her left hip.  She does have disease in her right hip also although according to patient this is not causing pain at this time.  COMPLICATIONS OF TREATMENT: none  FOLLOW UP COMPLIANCE: keeps appointments   PHYSICAL EXAM:  BP 120/69   Pulse 91   Temp 97.8 F (36.6 C)   Resp 18   Ht '5\' 7"'$  (1.702 m)   Wt 182 lb 1.6 oz (82.6 kg)   BMI 28.52 kg/m  Range of motion of the lower extremities does not elicit pain deep palpation of her lumbar spine does not elicit pain motor and sensory levels are equal and symmetric in the lower extremities.  Well-developed well-nourished patient in NAD. HEENT reveals PERLA, EOMI, discs not visualized.  Oral cavity is clear. No oral mucosal lesions are identified. Neck is clear without evidence of cervical or  supraclavicular adenopathy. Lungs are clear to A&P. Cardiac examination is essentially unremarkable with regular rate and rhythm without murmur rub or thrill. Abdomen is benign with no organomegaly or masses noted. Motor sensory and DTR levels are equal and symmetric in the upper and lower extremities. Cranial nerves II through XII are grossly intact. Proprioception is intact. No peripheral adenopathy or edema is identified. No motor or sensory levels are noted. Crude visual fields are within normal range.  RADIOLOGY RESULTS: PET scan and bone scan reviewed compatible with above-stated findings  PLAN: At this time elect to go ahead with palliative radiation therapy to her left hip.  Would plan on delivering 30 Gray in 10 fractions.  Based on this being a weightbearing area would not use hypofractionated treatment.  We will try to encompass all areas of disease including disease under region of her SI joint as well as acetabulum and left femur.  Risks and benefits of treatment including skin reaction fatigue possible some slight diarrhea all were reviewed with the patient and her husband.  I personally set up and ordered CT simulation for next week.  I would like to take this opportunity to thank you for allowing me to participate in the care of your patient.Noreene Filbert, MD

## 2021-12-06 NOTE — Progress Notes (Signed)
Having pain in left hip/lower back. No pain right now, she took a hydrocodone. Pain gets to be a 10/10.

## 2021-12-06 NOTE — Progress Notes (Signed)
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 Byrnett.] [id number] Carbo-taxol-AC;  JAN 2022-s/p Lumpectomy & ALND- [ypT1a (8m ) ypN1 (2/11 LN-positive)]; s/p radiation end of April 2022.  #Early June 2022-Xeloda 1000 mg per metered square [2000] 2 weeks on 1 week of 6-8 cycles; July 11th 2022- STARTING cycle #3-cut down the dose to 1500 mg BID[sec to HFS].   # JAN 2023- A. SKIN, RIGHT BREAST; PUNCH BIOPSY: [Dr.Byrnett] - INVASIVE CARCINOMA WITH DERMAL INFILTRATION AND LYMPHATIC INVASION. ER- weak POSISTIVE [10%]; PR/her2 NEG [[facility] 0]; JAN 20th, 2023- IMPRESSION: 1. Although the hypermetabolic right axillary node has resolved since the prior PET, there is a new hypermetabolic right low cervical/supraclavicular node, suspicious for nodal metastasis. 2. Decrease in right breast hypermetabolism with nonspecific overlying skin thickening in the setting of prior lumpectomy. 3. Low-level hypermetabolism within the T2 vertebral body, suspicious for isolated osseous metastasis. This could be confirmed with pre and postcontrast thoracic spine MRI. 4. Hypermetabolic thoracic nodes, slightly progressive. Given progression, nodal metastasis are slightly favored over reactive etiologies. 5. Incidental findings, including: Aortic Atherosclerosis (ICD10-I70.0). Tiny hiatal hernia. Hepatic steatosis with nonspecific caudate lobe enlargemen  Comment:  The carcinoma is  morphologically similar to the post-neoadjuvant mammary  carcinoma seen in the January 2022 right breast wide excision and lymph  Nodes  # FEB 6th-Taxol single agent #1; FEB 13th, 2023- [CPS =15]; Taxol-Keytruda  MAY 2023- CT CHEST:  Since the PET of 04/15/2021, enlargement of the previously described hypermetabolic right low cervical/supraclavicular nodal metastasis. Foci of hyperenhancement within the right breast are new or more well-defined since 02/02/2021, suspicious for progressive residual/recurrent disease. Nonspecific mediastinal nodes, similar to slightly decreased in size;  New sclerosis at the site of a known T2 vertebral body metastasis.   # s/p palliative radiation therapy to her right supraclavicular nodal metastasis as well as T2 vertebral body [30 Gray in 10 fractions ;[5/31 to  6/13].  CONTINUED TAXOL-KEYTRUDA; SEP 6th 2023-bone scan progressive disease.  Discontinue Taxol-Keytruda  #  SEP 2023- SACITUZUMAB GOVITECAN    DIAGNOSIS: Right breast cancer triple negative      Carcinoma of upper-inner quadrant of right breast in female, estrogen receptor negative ([place]  10/07/2019 Initial Diagnosis   Carcinoma of upper-inner quadrant of right breast in female, estrogen receptor negative ([place]   12/04/2019 Genetic Testing   Negative genetic testing. No pathogenic variants identified on the [facility] Common Hereditary Cancers Panel + Skin Cancers Panel. The report date is 12/04/2019.  The Common Hereditary Cancers Panel + Skin Cancers Panel offered by [facility] includes sequencing and/or deletion duplication testing of the following 54 genes: APC*, ATM*, AXIN2, BAP1, BARD1, BMPR1A, BRCA1, BRCA2, BRIP1, CDH1, CDK4, CDKN2A (p14ARF), CDKN2A (p16INK4a), CHEK2, CTNNA1, DICER1*, EPCAM*, GREM1*, HOXB13, KIT, MEN1*, MITF*, MLH1*, MSH2*, MSH3*, MSH6*, MUTYH, NBN, NF1*, NTHL1, PALB2, PDGFRA, PMS2*, POLD1*, POLE, POT1, PTCH1, PTEN*, RAD50, RAD51C, RAD51D, RB1*, RNF43, SDHA*, SDHB, SDHC*, SDHD,  SMAD4, SMARCA4, STK11, SUFU, TP53, TSC1*, TSC2, VHL.    05/07/2020 Cancer Staging   Staging form: Breast, AJCC 8th Edition - Clinical: Stage IIIB (  cT2, cN1, cM0, G3, ER-, PR-, HER2-) - Signed by Cammie Sickle, MD on 05/07/2020   04/18/2021 Cancer Staging   Staging form: Breast, AJCC 8th Edition - Pathologic: Stage IV (pT4d, pN3c, cM1, ER+, PR-, HER2-) - Signed by Cammie Sickle, MD on 04/18/2021 Nuclear grade: G3      HISTORY OF PRESENTING ILLNESS: Accompanied by husband.  Walking with walker.  Ashlee Cox 71 y.o.  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   This morning patient has been evaluated by radiation oncology.  Awaiting start radiation.  Noticed to have worsening pain of right and left hip- s in the last 2 to 3 weeks.  Worse with ambulation.  Patient taking hydrocodone up to 2 pills a day.  No trauma/falls.   Complains of mild to moderate fatigue.  Admits to compliance with her Synthroid.  Review of Systems  Constitutional:  Positive for malaise/fatigue. Negative for chills, diaphoresis, fever and weight loss.  HENT:  Negative for nosebleeds and sore throat.   Eyes:  Negative for double vision.  Respiratory:  Negative for cough, hemoptysis, sputum production and wheezing.   Cardiovascular:  Negative for chest pain, palpitations, orthopnea and leg swelling.  Gastrointestinal:  Negative for abdominal pain, blood in stool, constipation and melena.  Genitourinary:  Negative for dysuria, frequency and urgency.  Musculoskeletal:  Positive for back pain and neck pain. Negative for joint pain.  Neurological:  Positive for tingling. Negative for dizziness, focal weakness, weakness and headaches.  Endo/Heme/Allergies:  Does not bruise/bleed easily.  Psychiatric/Behavioral:  Negative for depression. The patient is not nervous/anxious and does not  have insomnia.      MEDICAL HISTORY:  Past Medical History:  Diagnosis Date  . Cancer [facility]) 2021   right breast  . Cyst, breast    benign  . Diabetes mellitus without complication ([place])   . Family history of melanoma   . Family history of ovarian cancer   . GERD (gastroesophageal reflux disease)   . H/O: rheumatic fever   . 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 Hyperlipidemia   . Hypertension   . Menopause    at age 19  . Personal history of chemotherapy 09/2019   RIGHT  INVASIVE MAMMARY CARCINOMA  . Personal history of radiation therapy     SURGICAL HISTORY: Past Surgical History:  Procedure Laterality Date  . APPENDECTOMY  2006  . BREAST BIOPSY Right 09/22/2019   INVASIVE MAMMARY CARCINOMA  . BREAST CYST ASPIRATION Left 1999  . BREAST LUMPECTOMY Left 04/23/2020   surgery with NL and SN   . BREAST LUMPECTOMY WITH NEEDLE LOCALIZATION Right 04/23/2020   Procedure: BREAST LUMPECTOMY WITH NEEDLE LOCALIZATION;  Surgeon: Robert Bellow, MD;  Location: [facility] ORS;  Service: General;  Laterality: Right;  . BREAST LUMPECTOMY WITH SENTINEL LYMPH NODE BIOPSY Right 04/23/2020   Procedure: BREAST LUMPECTOMY WITH SENTINEL LYMPH NODE BX;  Surgeon: Robert Bellow, MD;  Location: [facility] ORS;  Service: General;  Laterality: Right;  . CHOLECYSTECTOMY  2006  . COLONOSCOPY    . PORTACATH PLACEMENT Left 10/06/2019   Procedure: INSERTION PORT-A-CATH;  Surgeon: Robert Bellow, MD;  Location: [facility] ORS;  Service: General;  Laterality: Left;  . SUBMUCOSAL TATTOO INJECTION Right 10/06/2019   Procedure: Right axillary TATTOO INJECTION;  Surgeon: Robert Bellow, MD;  Location: [facility] ORS;  Service: General;  Laterality: Right;  Marland Kitchen VAGINAL DELIVERY     x3    SOCIAL HISTORY: Social History   Socioeconomic History  . Marital status: Married    Spouse name: Chrissie Noa  . Number of children: 3  . Years of education: Not on file  .  Highest education level: Not on file  Occupational History  . Occupation: Glass blower/designer: [id number]  Tobacco Use  . Smoking status: Former    Years: 1.00    Types: Cigarettes    Quit date: 12/26/2005    Years since quitting: 15.9  . Smokeless tobacco: Never  . Tobacco comments:    smoked for less than 1 years,  1 cig/day  Vaping Use  . Vaping Use: Never used  Substance and Sexual Activity  . Alcohol use: No  . Drug use: No  . Sexual activity: Never    Partners: Female  Other Topics Concern  . Not on file  Social History Narrative   Widowed, March 2014; remarried.      [employer]; quit smoking 1ppw; no alcohol.       Social Determinants of Health   Financial Resource Strain: Medium Risk (05/25/2021)   Overall Financial Resource Strain (CARDIA)   . Difficulty of Paying Living Expenses: Somewhat hard  Food Insecurity: No Food Insecurity (05/18/2021)   Hunger Vital Sign   . Worried About Charity fundraiser in the Last Year: Never true   . Ran Out of Food in the Last Year: Never true  Transportation Needs: No Transportation Needs (05/18/2021)   PRAPARE - Transportation   . Lack of Transportation (Medical): No   . Lack of Transportation (Non-Medical): No  Physical Activity: Inactive (05/18/2021)   Exercise Vital Sign   . Days of Exercise per Week: 0 days   . Minutes of Exercise per Session: 0 min  Stress: No Stress Concern Present (05/18/2021)   [place]   . Feeling of Stress : Not at all  Social Connections: Socially Integrated (05/18/2021)   Social Connection and Isolation Panel [NHANES]   . Frequency of Communication with Friends and Family: More than three times a week   . Frequency of Social Gatherings with Friends and Family: More than three times a week   . Attends Religious Services: More than 4 times per year   . Active Member of Clubs or Organizations: Yes   . Attends Archivist  Meetings: More than 4 times per year   . Marital Status: Married  Human resources officer Violence: Not At Risk (05/04/2021)   Humiliation, Afraid, Rape, and Kick questionnaire   . Fear of Current or Ex-Partner: No   . Emotionally Abused: No   . Physically Abused: No   . Sexually Abused: No    FAMILY HISTORY: Family History  Problem Relation Age of Onset  . Cancer Mother 19       ovarian- died 4-5 years.   . Heart disease Mother 51  . Diabetes Mother   . Stroke Father 29  . Diabetes Father   . Diabetes Sister   . Melanoma Sister        survived  . Breast cancer Neg Hx     ALLERGIES:  has No Known Allergies.  MEDICATIONS:  Current Outpatient Medications  Medication Sig Dispense Refill  . acetaminophen (TYLENOL) 500 MG tablet Take 500 mg by mouth every 8 (eight) hours as needed for moderate pain.    Marland Kitchen  calcium carbonate (OS-CAL) 600 MG TABS tablet Take 600 mg by mouth daily.    . Continuous Blood Gluc Sensor (FREESTYLE LIBRE 3 SENSOR) MISC Apply 1 each topically every 14 (fourteen) days. Place 1 sensor on the skin every 14 days. Use to check glucose continuously 6 each 3  . diphenoxylate-atropine (LOMOTIL) 2.5-0.025 MG tablet Take 1 tablet by mouth 4 (four) times daily as needed for diarrhea or loose stools. Take it along with immodium 60 tablet 0  . fentaNYL (DURAGESIC) 25 MCG/HR Place 1 patch onto the skin every 3 (three) days. 10 patch 0  . gabapentin (NEURONTIN) 100 MG capsule TAKE 2 CAPSULES BY MOUTH AT BEDTIME 180 capsule 0  . glipiZIDE (GLUCOTROL XL) 10 MG 24 hr tablet Take 1 tablet by mouth once daily with breakfast 90 tablet 0  . KLOR-CON M20 20 MEQ tablet Take 1 tablet by mouth twice daily 60 tablet 0  . levothyroxine (SYNTHROID) 100 MCG tablet TAKE 1 TABLET BY MOUTH ONCE DAILY IN THE MORNING BEFORE BREAKFAST 90 tablet 0  . lidocaine-prilocaine (EMLA) cream Apply 1 application topically as needed. Apply to port and cover with saran wrap 1-2 hours prior to port access 30 g 1  .  losartan-hydrochlorothiazide (HYZAAR) 50-12.5 MG tablet Take 1 tablet by mouth once daily 90 tablet 0  . lovastatin (MEVACOR) 40 MG tablet TAKE 1 TABLET BY MOUTH AT BEDTIME 90 tablet 1  . magnesium oxide (MAG-OX) 400 (240 Mg) MG tablet Take 400 mg by mouth daily.    . metFORMIN (GLUCOPHAGE) 1000 MG tablet TAKE 2 TABLETS BY MOUTH ONCE DAILY WITH BREAKFAST 180 tablet 0  . Multiple Vitamin (MULTIVITAMIN) tablet Take 1 tablet by mouth daily.    Marland Kitchen omeprazole (PRILOSEC) 20 MG capsule Take 1 capsule by mouth once daily 90 capsule 0  . ondansetron (ZOFRAN) 8 MG tablet One pill every 8 hours as needed for nausea/vomitting. 40 tablet 1  . prochlorperazine (COMPAZINE) 10 MG tablet Take 1 tablet (10 mg total) by mouth every 6 (six) hours as needed for nausea or vomiting. 40 tablet 1  . Semaglutide,0.25 or 0.5MG/DOS, (OZEMPIC, 0.25 OR 0.5 MG/DOSE,) 2 MG/1.5ML SOPN Inject 0.5 mg into the skin once a week. 1.5 mL 11  . vitamin B-12 (CYANOCOBALAMIN) 100 MCG tablet Take 100 mcg by mouth daily.    Marland Kitchen HYDROcodone-acetaminophen (NORCO/VICODIN) 5-325 MG tablet Take 1 tablet by mouth every 6 (six) hours as needed for moderate pain. 90 tablet 0   No current facility-administered medications for this visit.      Marland Kitchen  PHYSICAL EXAMINATION: ECOG PERFORMANCE STATUS: 0 - Asymptomatic  Vitals:   12/06/21 0958  BP: 120/69  Pulse: 91  Resp: 18  Temp: 97.8 F (36.6 C)  SpO2: 95%     Filed Weights   12/06/21 0958  Weight: 182 lb 1.6 oz (82.6 kg)     Right supraclavicular lymphadenopathy felt. 2-3 cm in size.   Physical Exam HENT:     Head: Normocephalic and atraumatic.     Mouth/Throat:     Pharynx: No oropharyngeal exudate.  Eyes:     Pupils: Pupils are equal, round, and reactive to light.  Cardiovascular:     Rate and Rhythm: Normal rate and regular rhythm.  Pulmonary:     Effort: Pulmonary effort is normal. No respiratory distress.     Breath sounds: Normal breath sounds. No wheezing.  Abdominal:      General: Bowel sounds are normal. There is no distension.  Palpations: Abdomen is soft. There is no mass.     Tenderness: There is no abdominal tenderness. There is no guarding or rebound.  Musculoskeletal:        General: No tenderness. Normal range of motion.     Cervical back: Normal range of motion and neck supple.  Skin:    General: Skin is warm.  Neurological:     Mental Status: She is alert and oriented to person, place, and time.  Psychiatric:        Mood and Affect: Affect normal.     LABORATORY DATA:  I have reviewed the data as listed Lab Results  Component Value Date   WBC 6.9 12/06/2021   HGB 9.7 (L) 12/06/2021   HCT 30.4 (L) 12/06/2021   MCV 84.4 12/06/2021   PLT 282 12/06/2021   Recent Labs    11/21/21 0832 11/30/21 0921 12/06/21 0944  NA 136 137 135  K 3.8 3.6 3.8  CL 104 105 104  CO2 23 24 21*  GLUCOSE 156* 193* 189*  BUN 11 10 12   CREATININE 0.87 0.72 0.99  CALCIUM 9.0 9.5 8.8*  GFRNONAA >60 >60 >60  PROT 7.5 7.3 7.8  ALBUMIN 3.7 3.8 3.4*  AST 40 40 38  ALT 17 17 17   ALKPHOS 114 123 190*  BILITOT 0.5 0.7 0.8    RADIOGRAPHIC STUDIES: I have personally reviewed the radiological images as listed and agreed with the findings in the report. NM PET Image Restage (PS) Skull Base to Thigh (F-18 FDG)  Result Date: 12/05/2021 CLINICAL DATA:  Subsequent treatment strategy for breast cancer. EXAM: NUCLEAR MEDICINE PET SKULL BASE TO THIGH TECHNIQUE: 10.1 mCi F-18 FDG was injected intravenously. Full-ring PET imaging was performed from the skull base to thigh after the radiotracer. CT data was obtained and used for attenuation correction and anatomic localization. Fasting blood glucose: 127 mg/dl COMPARISON:  Bone scan 11/23/2021, CT chest abdomen pelvis 10/21/2021 and PET 04/15/2021. FINDINGS: Mediastinal blood pool activity: SUV max 1.6 Liver activity: SUV max NA NECK: No abnormal hypermetabolism. Incidental CT findings: None. CHEST: 1.9 cm right  supraclavicular lymph node (2/52), SUV max 8.7, enlarged and increasingly hypermetabolic when compared with prior PET. Patchy consolidation and slight pulmonary retraction in the apical segment right upper lobe, presumably radiation related and with associated mild hypermetabolism. Subpleural nodular consolidation in the medial left upper lobe is new from 10/21/2021 and measures 1.6 x 2.2 cm (2/69), SUV max, 3.3. Three hypermetabolic nodules in right breast measure up to 3.0 x 3.3 cm (2/101), SUV max 8.4. No hypermetabolic mediastinal, hilar, axillary or internal mammary lymph nodes. Incidental CT findings: Left subclavian Port-A-Cath terminates in the high right atrium. Atherosclerotic calcification of the aorta. Heart is at the upper limits of normal in size to mildly enlarged. No pericardial or pleural effusion. ABDOMEN/PELVIS: No abnormal hypermetabolism in the liver, adrenal glands, spleen or pancreas. No hypermetabolic lymph nodes. Incidental CT findings: Liver is unremarkable. Cholecystectomy. Adrenal glands are unremarkable. Subcentimeter low-attenuation lesion in the right kidney, too small to characterize. No specific follow-up necessary. Kidneys, spleen, pancreas, stomach and bowel are otherwise unremarkable with the exception of a small hiatal hernia. SKELETON: Multiple hypermetabolic osseous lesions, as described on bone scan 11/23/2021. Findings are largely new from PET 04/15/2021. Index new hypermetabolic lesion in the proximal right femur, SUV max 6.4. Incidental CT findings: Degenerative changes in the spine. Chronic bilateral L5 pars defects. IMPRESSION: 1. Three hypermetabolic right breast nodules with a right supraclavicular nodal metastasis and diffuse  osseous metastatic disease. 2. Subpleural nodular consolidation in the apical left upper lobe is new from 10/21/2021 and may be infectious/inflammatory in etiology or related to evolving radiation therapy. Please correlate clinically. Recommend  attention on follow-up as metastatic disease cannot be definitively excluded. 3.  Aortic atherosclerosis (ICD10-I70.0). Electronically Signed   By: Lorin Picket M.D.   On: 12/05/2021 08:38   NM Bone Scan Whole Body  Result Date: 11/24/2021 CLINICAL DATA:  Bone pain, RIGHT breast cancer, LEFT hip pain EXAM: NUCLEAR MEDICINE WHOLE BODY BONE SCAN TECHNIQUE: Whole body anterior and posterior images were obtained approximately 3 hours after intravenous injection of radiopharmaceutical. RADIOPHARMACEUTICALS:  20.02 mCi Technetium-73mMDP IV COMPARISON:  None Correlation: CT chest abdomen pelvis 10/21/2021 FINDINGS: Multiple foci of abnormal tracer uptake consistent with osseous metastatic disease. These include distal sternum, thoracic spine, BILATERAL ribs, pelvis, and BILATERAL femora. Uptake within LEFT lateral cervical spine and lower lumbar spine could be degenerative less likely metastatic. Degenerative type uptake of tracer at hips and knees. Questionable metastatic focus LEFT parietal. Nonspecific tracer uptake in the breast bilaterally. Otherwise expected urinary tract and soft tissue distribution of tracer. IMPRESSION: Scattered sites of abnormal osseous tracer uptake as above consistent with osseous metastases, including BILATERAL femora. Electronically Signed   By: MLavonia DanaM.D.   On: 11/24/2021 09:50   DG HIP UNILAT W OR W/O PELVIS 2-3 VIEWS LEFT  Result Date: 11/15/2021 CLINICAL DATA:  Left-sided hip pain EXAM: DG HIP (WITH OR WITHOUT PELVIS) 2-3V LEFT COMPARISON:  CT 10/21/2021 FINDINGS: SI joints are patent. Pubic symphysis and rami appear intact. No fracture or malalignment. Left hip joint space appears patent. 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 IMPRESSION: Negative. Electronically Signed   By: KDonavan FoilM.D.   On: 11/15/2021 18:24    ASSESSMENT & PLAN:   Carcinoma of upper-inner quadrant of right breast in female, estrogen receptor  negative ([place] #Recurrent right breast cancer-s/p breast skin biopsy [FEB 2023- ER positive /PR HER2-0; however 2017-ER-NEGATIVE; -HER2 NEU-1+/LOW]; - recurrent- stage IV; NGS -FEB 2023- CPS =15%. Currently on Keytruda- taxol [2/13]; BONE SCAN- November 24, 2021- Scattered sites of abnormal osseous tracer uptake  consistent with osseous metastases, including BILATERAL femora.  Given the progressive disease discontinue Taxol Keytruda. 12/02/2021- PET SCAN-  Three hypermetabolic right breast nodules with a right supraclavicular nodal metastasis and diffuse osseous metastatic disease.  Subpleural nodular consolidation in the apical left upper lobe is new from 10/21/2021 and may be infectious/inflammatory in etiology or related to evolving radiation therapy.   #Given the progression of disease-recommend discontinuation of Keytruda plus Taxol.  Recommend proceeding with with chemotherapy- sacituzumab govitecan- days 1 &8 q 21 days.  Understands treatments are palliative not curative. awaiting UGTA.  Discussed the potential side effects of diarrhea; also low blood counts.  We will need to monitor therapy closely for blood counts while on radiation.  Called in prescription for Lomotil; also for antiemetics.  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 CDonella Stade  Currently agrees to expedite radiation; plan start on 9/20.   #Malignancy-pain-recommend adding fentanyl patch 25 mcg every 3 days; also add hydrocodone 1 pill every 6-8 hours.  As needed-prescription sent  # HYPO [place] potassium 3.2 continue K-Dur 20  mg twice a day recommend inreasing fluid intake.-STABLE today 4.0.   # PN-1-2 sec to taxol-on Neurontin 200 qhs-STABLE.   # DM/poorly  controlled- continue metformin 2000 mg q AM [Dr.Tullo]; on glipizide 10 mg XL. Continue follow up with  PCP.  On CGM.- FBG- 144-STABLE   # Iron def anemia-[Iron studies- July 2023] continue Venofer-.   # Mediport: Functioning.   # I reviewed the blood work- with the patient in detail; also reviewed the imaging independently [as summarized above]; and with the patient in detail.   Next zometa on 10/04 # DISPOSITION: # 1 week- NP- Labs- cbc-cmp; Sacituzumab [new] # follow up in 2 weeks- MD: labs cbc/cmp; sacituzumab;  Dr. .B  # I reviewed the blood work- with the patient in detail; also reviewed the imaging independently [as summarized above]; and with the patient in detail.          All questions were answered. The patient/family knows to call the clinic with any problems, questions or concerns.   Cammie Sickle, MD 12/06/2021 1:41 PM

## 2021-12-06 NOTE — Assessment & Plan Note (Addendum)
#  Recurrent right breast cancer-s/p breast skin biopsy [FEB 2023- ER positive /PR HER2-0; however 2017-ER-NEGATIVE; -HER2 NEU-1+/LOW]; - recurrent- stage IV; NGS -FEB 2023- CPS =15%. Currently on Keytruda- taxol [2/13]; BONE SCAN- November 24, 2021- Scattered sites of abnormal osseous tracer uptake  consistent with osseous metastases, including BILATERAL femora.  Given the progressive disease discontinue Taxol Keytruda. 12/02/2021- PET SCAN-  Three hypermetabolic right breast nodules with a right supraclavicular nodal metastasis and diffuse osseous metastatic disease.  Subpleural nodular consolidation in the apical left upper lobe is new from 10/21/2021 and may be infectious/inflammatory in etiology or related to evolving radiation therapy.   #Given the progression of disease-recommend discontinuation of Keytruda plus Taxol.  Recommend proceeding with with chemotherapy- sacituzumab govitecan- days 1 &8 q 21 days [at 20 % dose reduction].  Understands treatments are palliative not curative. awaiting UGTA.  Discussed the potential side effects of diarrhea; also low blood counts.  We will need to monitor therapy closely for blood counts while on radiation.  Called in prescription for Lomotil; also for antiemetics.  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 Donella Stade.  Currently agrees to expedite radiation; plan start on 9/20.   #Malignancy-pain-recommend adding fentanyl patch 25 mcg every 3 days; also add hydrocodone 1 pill every 6-8 hours.  As needed-prescription sent  # HYPO Kalemia potassium 3.2 continue K-Dur 20 mg twice a day recommend inreasing fluid intake.-STABLE today 4.0.   # PN-1-2 sec to taxol-on Neurontin 200 qhs-STABLE.   # DM/poorly  controlled- continue metformin 2000 mg q AM [Dr.Tullo]; on glipizide 10 mg XL. Continue follow up with  PCP.  On CGM.- FBG- 144-STABLE   # Iron def anemia-[Iron studies- July 2023] continue Venofer-.   # Mediport: Functioning.   # I reviewed the blood work- with the patient in detail; also reviewed the imaging independently [as summarized above]; and with the patient in detail.   Next zometa on 10/04 # DISPOSITION: # 1 week- NP- Labs- cbc-cmp; Sacituzumab [new] # follow up in 2 weeks- MD: labs cbc/cmp; sacituzumab;  Dr. .B  # I reviewed the blood work- with the patient in detail; also reviewed the imaging independently [as summarized above]; and with the patient in detail.

## 2021-12-06 NOTE — Telephone Encounter (Signed)
PA fax submitted for Ondansetron via cover my meds (Key: [id number])  Prior Authorization for [place] Medicare Members. [phone] [phone]

## 2021-12-08 ENCOUNTER — Encounter: Payer: Self-pay | Admitting: Internal Medicine

## 2021-12-08 NOTE — Telephone Encounter (Signed)
PA approved.

## 2021-12-12 ENCOUNTER — Ambulatory Visit
Admission: RE | Admit: 2021-12-12 | Discharge: 2021-12-12 | Disposition: A | Discharge status: Home / Self Care | Payer: PPO | Source: Ambulatory Visit | Attending: Radiation Oncology | Admitting: Radiation Oncology

## 2021-12-12 ENCOUNTER — Other Ambulatory Visit: Payer: Self-pay

## 2021-12-12 DIAGNOSIS — C7951 Secondary malignant neoplasm of bone: Secondary | ICD-10-CM | POA: Insufficient documentation

## 2021-12-12 DIAGNOSIS — Z5112 Encounter for antineoplastic immunotherapy: Secondary | ICD-10-CM | POA: Diagnosis not present

## 2021-12-12 DIAGNOSIS — Z171 Estrogen receptor negative status [ER-]: Secondary | ICD-10-CM | POA: Diagnosis not present

## 2021-12-12 DIAGNOSIS — Z510 Encounter for antineoplastic radiation therapy: Secondary | ICD-10-CM | POA: Insufficient documentation

## 2021-12-12 DIAGNOSIS — C50911 Malignant neoplasm of unspecified site of right female breast: Secondary | ICD-10-CM | POA: Insufficient documentation

## 2021-12-12 MED FILL — Fosaprepitant Dimeglumine For IV Infusion 150 MG (Base Eq): INTRAVENOUS | Qty: 5 | Status: AC

## 2021-12-12 MED FILL — Dexamethasone Sodium Phosphate Inj 100 MG/10ML: INTRAMUSCULAR | Qty: 1 | Status: AC

## 2021-12-13 ENCOUNTER — Inpatient Hospital Stay (HOSPITAL_BASED_OUTPATIENT_CLINIC_OR_DEPARTMENT_OTHER): Payer: PPO | Admitting: Oncology

## 2021-12-13 ENCOUNTER — Ambulatory Visit: Payer: PPO

## 2021-12-13 ENCOUNTER — Other Ambulatory Visit: Payer: PPO

## 2021-12-13 ENCOUNTER — Inpatient Hospital Stay: Payer: PPO

## 2021-12-13 ENCOUNTER — Encounter: Payer: Self-pay | Admitting: Oncology

## 2021-12-13 ENCOUNTER — Ambulatory Visit: Payer: PPO | Admitting: Internal Medicine

## 2021-12-13 DIAGNOSIS — C50911 Malignant neoplasm of unspecified site of right female breast: Secondary | ICD-10-CM | POA: Diagnosis not present

## 2021-12-13 DIAGNOSIS — C7951 Secondary malignant neoplasm of bone: Secondary | ICD-10-CM | POA: Diagnosis not present

## 2021-12-13 DIAGNOSIS — Z171 Estrogen receptor negative status [ER-]: Secondary | ICD-10-CM | POA: Diagnosis not present

## 2021-12-13 DIAGNOSIS — D4861 Neoplasm of uncertain behavior of right breast: Secondary | ICD-10-CM

## 2021-12-13 DIAGNOSIS — C50211 Malignant neoplasm of upper-inner quadrant of right female breast: Secondary | ICD-10-CM

## 2021-12-13 DIAGNOSIS — Z5112 Encounter for antineoplastic immunotherapy: Secondary | ICD-10-CM | POA: Diagnosis not present

## 2021-12-13 LAB — COMPREHENSIVE METABOLIC PANEL
ALT: 16 U/L (ref 0–44)
AST: 39 U/L (ref 15–41)
Albumin: 3.1 g/dL — ABNORMAL LOW (ref 3.5–5.0)
Alkaline Phosphatase: 199 U/L — ABNORMAL HIGH (ref 38–126)
Anion gap: 9 (ref 5–15)
BUN: 13 mg/dL (ref 8–23)
CO2: 26 mmol/L (ref 22–32)
Calcium: 9.3 mg/dL (ref 8.9–10.3)
Chloride: 100 mmol/L (ref 98–111)
Creatinine, Ser: 0.82 mg/dL (ref 0.44–1.00)
GFR, Estimated: >60 mL/min (ref >60)
Glucose, Bld: 145 mg/dL — ABNORMAL HIGH (ref 70–99)
Potassium: 4 mmol/L (ref 3.5–5.1)
Sodium: 135 mmol/L (ref 135–145)
Total Bilirubin: 0.6 mg/dL (ref 0.3–1.2)
Total Protein: 7.7 g/dL (ref 6.5–8.1)

## 2021-12-13 LAB — CBC WITH DIFFERENTIAL/PLATELET
Abs Immature Granulocytes: 0.34 10*3/uL — ABNORMAL HIGH (ref 0.00–0.07)
Basophils Absolute: 0.1 10*3/uL (ref 0.0–0.1)
Basophils Relative: 1 %
Eosinophils Absolute: 0.1 10*3/uL (ref 0.0–0.5)
Eosinophils Relative: 1 %
HCT: 29.3 % — ABNORMAL LOW (ref 36.0–46.0)
Hemoglobin: 9.3 g/dL — ABNORMAL LOW (ref 12.0–15.0)
Immature Granulocytes: 4 %
Lymphocytes Relative: 13 %
Lymphs Abs: 1.1 10*3/uL (ref 0.7–4.0)
MCH: 26.8 pg (ref 26.0–34.0)
MCHC: 31.7 g/dL (ref 30.0–36.0)
MCV: 84.4 fL (ref 80.0–100.0)
Monocytes Absolute: 0.6 10*3/uL (ref 0.1–1.0)
Monocytes Relative: 7 %
Neutro Abs: 6.4 10*3/uL (ref 1.7–7.7)
Neutrophils Relative %: 74 %
Platelets: 353 10*3/uL (ref 150–400)
RBC: 3.47 MIL/uL — ABNORMAL LOW (ref 3.87–5.11)
RDW: 21.3 % — ABNORMAL HIGH (ref 11.5–15.5)
WBC: 8.6 10*3/uL (ref 4.0–10.5)
nRBC: 0.2 % (ref 0.0–0.2)

## 2021-12-13 LAB — MISC LABCORP TEST (SEND OUT): Labcorp test code: 511200

## 2021-12-13 LAB — PHOSPHORUS: Phosphorus: 3.3 mg/dL (ref 2.5–4.6)

## 2021-12-13 LAB — MAGNESIUM: Magnesium: 1.3 mg/dL — ABNORMAL LOW (ref 1.7–2.4)

## 2021-12-13 MED ORDER — HEPARIN SOD (PORK) LOCK FLUSH 100 UNIT/ML IV SOLN
500.0000 [IU] | Freq: Once | INTRAVENOUS | Status: AC | PRN
  Administered 2021-12-13: 500 [IU]
  Filled 2021-12-13: qty 5

## 2021-12-13 MED ORDER — DIPHENHYDRAMINE HCL 50 MG/ML IJ SOLN
50.0000 mg | Freq: Once | INTRAMUSCULAR | Status: AC
  Administered 2021-12-13: 50 mg via INTRAVENOUS
  Filled 2021-12-13: qty 1

## 2021-12-13 MED ORDER — SODIUM CHLORIDE 0.9 % IV SOLN
10.0000 mg | Freq: Once | INTRAVENOUS | Status: AC
  Administered 2021-12-13: 10 mg via INTRAVENOUS
  Filled 2021-12-13: qty 10

## 2021-12-13 MED ORDER — SODIUM CHLORIDE 0.9 % IV SOLN
150.0000 mg | Freq: Once | INTRAVENOUS | Status: AC
  Administered 2021-12-13: 150 mg via INTRAVENOUS
  Filled 2021-12-13: qty 150

## 2021-12-13 MED ORDER — SODIUM CHLORIDE 0.9 % IV SOLN
8.0000 mg/kg | Freq: Once | INTRAVENOUS | Status: AC
  Administered 2021-12-13: 680 mg via INTRAVENOUS
  Filled 2021-12-13: qty 68

## 2021-12-13 MED ORDER — FAMOTIDINE IN NACL 20-0.9 MG/50ML-% IV SOLN
20.0000 mg | Freq: Once | INTRAVENOUS | Status: AC
  Administered 2021-12-13: 20 mg via INTRAVENOUS
  Filled 2021-12-13: qty 50

## 2021-12-13 MED ORDER — SODIUM CHLORIDE 0.9 % IV SOLN
Freq: Once | INTRAVENOUS | Status: AC
  Filled 2021-12-13: qty 250

## 2021-12-13 MED ORDER — ACETAMINOPHEN 325 MG PO TABS
650.0000 mg | ORAL_TABLET | Freq: Once | ORAL | Status: AC
  Administered 2021-12-13: 650 mg via ORAL
  Filled 2021-12-13: qty 2

## 2021-12-13 MED ORDER — ATROPINE SULFATE 1 MG/ML IV SOLN
0.5000 mg | Freq: Once | INTRAVENOUS | Status: AC | PRN
  Administered 2021-12-13: 0.5 mg via INTRAVENOUS
  Filled 2021-12-13: qty 1

## 2021-12-13 MED ORDER — PALONOSETRON HCL INJECTION 0.25 MG/5ML
0.2500 mg | Freq: Once | INTRAVENOUS | Status: AC
  Administered 2021-12-13: 0.25 mg via INTRAVENOUS
  Filled 2021-12-13: qty 5

## 2021-12-13 MED ORDER — MAGNESIUM SULFATE 4 GM/100ML IV SOLN
4.0000 g | Freq: Once | INTRAVENOUS | Status: AC
  Administered 2021-12-13: 4 g via INTRAVENOUS
  Filled 2021-12-13: qty 100

## 2021-12-13 NOTE — Progress Notes (Signed)
Due to new tx start today and allotted chair time, pt only able to receive 2g of the 4g Magnesium that was ordered. Provider made aware. Per Dr. Tasia Catchings, pt will return for lab work later this week to re-evaluate need for more iv mag. Pt to continue oral mag supplements

## 2021-12-13 NOTE — Assessment & Plan Note (Addendum)
#  Recurrent right breast cancer-s/p breast skin biopsy [FEB 2023- ER positive /PR HER2-0; however 2017-ER-NEGATIVE; -HER2 NEU-1+/LOW]; - recurrent- stage IV; NGS -FEB 2023- CPS =15%. Currently on Keytruda- taxol [2/13]; BONE SCAN- November 24, 2021- Scattered sites of abnormal osseous tracer uptake  consistent with osseous metastases, including BILATERAL femora.  Given the progressive disease discontinue Taxol Keytruda. 12/02/2021- PET SCAN-  Three hypermetabolic right breast nodules with a right supraclavicular nodal metastasis and diffuse osseous metastatic disease.  Subpleural nodular consolidation in the apical left upper lobe is new from 10/21/2021 and may be infectious/inflammatory in etiology or related to evolving radiation therapy.   #Labs are reviewed and discussed with patient. Proceed with with chemotherapy- sacituzumab govitecan- days 1 &8 q 21 days [at 20 % dose reduction].  monitor therapy closely for blood counts while on radiation.  Called in prescription for Lomotil; also for antiemetics.  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 Donella Stade.  Currently agrees to expedite radiation; plan start on 9/20.   #Malignancy-pain-recommend adding fentanyl patch 25 mcg every 3 days; also add hydrocodone 1 pill every 6-8 hours.  As needed- continue current regimen  # HYPO Kalemia potassium 3.2 continue K-Dur 20 mg twice a day recommend inreasing fluid intake.-STABLE today 4.0.   # PN-1-2 sec to taxol-on Neurontin 200 qhs-STABLE.   # DM/poorly controlled- continue metformin 2000 mg q AM [Dr.Tullo]; on glipizide 10 mg XL. Continue follow up with  PCP.  On CGM.- FBG- 144-STABLE   # Iron def anemia-[Iron studies- July 2023]   # Mediport: Functioning.   # I reviewed the blood work-  with the patient in detail; also reviewed the imaging independently [as summarized above]; and with the patient in detail.   Next zometa on 10/04

## 2021-12-13 NOTE — Patient Instructions (Signed)
[facility]  Discharge Instructions: Thank you for choosing [facility] to provide your oncology and hematology care.  If you have a lab appointment with the Cancer Center, please go directly to the Cancer Center and check in at the registration area.  Wear comfortable clothing and clothing appropriate for easy access to any Portacath or PICC line.   We strive to give you quality time with your provider. You may need to reschedule your appointment if you arrive late (15 or more minutes).  Arriving late affects you and other patients whose appointments are after yours.  Also, if you miss three or more appointments without notifying the office, you may be dismissed from the clinic at the provider's discretion.      For prescription refill requests, have your pharmacy contact our office and allow 72 hours for refills to be completed.    Today you received the following chemotherapy and/or immunotherapy agents- Trodelvy      To help prevent nausea and vomiting after your treatment, we encourage you to take your nausea medication as directed.  BELOW ARE SYMPTOMS THAT SHOULD BE REPORTED IMMEDIATELY: *FEVER GREATER THAN 100.4 F (38 C) OR HIGHER *CHILLS OR SWEATING *NAUSEA AND VOMITING THAT IS NOT CONTROLLED WITH YOUR NAUSEA MEDICATION *UNUSUAL SHORTNESS OF BREATH *UNUSUAL BRUISING OR BLEEDING *URINARY PROBLEMS (pain or burning when urinating, or frequent urination) *BOWEL PROBLEMS (unusual diarrhea, constipation, pain near the anus) TENDERNESS IN MOUTH AND THROAT WITH OR WITHOUT PRESENCE OF ULCERS (sore throat, sores in mouth, or a toothache) UNUSUAL RASH, SWELLING OR PAIN  UNUSUAL VAGINAL DISCHARGE OR ITCHING   Items with * indicate a potential emergency and should be followed up as soon as possible or go to the Emergency Department if any problems should occur.  Please show the CHEMOTHERAPY ALERT CARD or IMMUNOTHERAPY ALERT CARD at check-in to  the Emergency Department and triage nurse.  Should you have questions after your visit or need to cancel or reschedule your appointment, please contact [facility]  [phone] and follow the prompts.  Office hours are 8:00 a.m. to 4:30 p.m. Monday - Friday. Please note that voicemails left after 4:00 p.m. may not be returned until the following business day.  We are closed weekends and major holidays. You have access to a nurse at all times for urgent questions. Please call the main number to the clinic [phone] and follow the prompts.  For any non-urgent questions, you may also contact your provider using MyChart. We now offer e-Visits for anyone 18 and older to request care online for non-urgent symptoms. For details visit [email or web address].   Also download the MyChart app! Go to the app store, search "MyChart", open the app, select [facility], and log in with your MyChart username and password.  Masks are optional in the cancer centers. If you would like for your care team to wear a mask while they are taking care of you, please let them know. For doctor visits, patients may have with them one support person who is at least 69 years old. At this time, visitors are not allowed in the infusion area.   

## 2021-12-13 NOTE — Progress Notes (Signed)
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 Byrnett.] [id number] Carbo-taxol-AC;  JAN 2022-s/p Lumpectomy & ALND- [ypT1a (91m ) ypN1 (2/11 LN-positive)]; s/p radiation end of April 2022.  #Early June 2022-Xeloda 1000 mg per metered square [2000] 2 weeks on 1 week of 6-8 cycles; July 11th 2022- STARTING cycle #3-cut down the dose to 1500 mg BID[sec to HFS].   # JAN 2023- A. SKIN, RIGHT BREAST; PUNCH BIOPSY: [Dr.Byrnett] - INVASIVE CARCINOMA WITH DERMAL INFILTRATION AND LYMPHATIC INVASION. ER- weak POSISTIVE [10%]; PR/her2 NEG [[facility] 0]; JAN 20th, 2023- IMPRESSION: 1. Although the hypermetabolic right axillary node has resolved since the prior PET, there is a new hypermetabolic right low cervical/supraclavicular node, suspicious for nodal metastasis. 2. Decrease in right breast hypermetabolism with nonspecific overlying skin thickening in the setting of prior lumpectomy. 3. Low-level hypermetabolism within the T2 vertebral body, suspicious for isolated osseous metastasis. This could be confirmed with pre and postcontrast thoracic spine MRI. 4. Hypermetabolic thoracic nodes, slightly progressive. Given progression, nodal metastasis are slightly favored over reactive etiologies. 5. Incidental findings, including: Aortic Atherosclerosis (ICD10-I70.0). Tiny hiatal hernia. Hepatic steatosis with nonspecific caudate lobe  enlargemen  Comment:  The carcinoma is morphologically similar to the post-neoadjuvant mammary  carcinoma seen in the January 2022 right breast wide excision and lymph  Nodes  # FEB 6th-Taxol single agent #1; FEB 13th, 2023- [CPS =15]; Taxol-Keytruda  MAY 2023- CT CHEST:  Since the PET of 04/15/2021, enlargement of the previously described hypermetabolic right low cervical/supraclavicular nodal metastasis. Foci of hyperenhancement within the right breast are new or more well-defined since 02/02/2021, suspicious for progressive residual/recurrent disease. Nonspecific mediastinal nodes, similar to slightly decreased in size;  New sclerosis at the site of a known T2 vertebral body metastasis.   # s/p palliative radiation therapy to her right supraclavicular nodal metastasis as well as T2 vertebral body [30 Gray in 10 fractions ;[5/31 to  6/13].  CONTINUED TAXOL-KEYTRUDA; SEP 6th 2023-bone scan progressive disease.  Discontinue Taxol-Keytruda  #  SEP 19th 2023- SACITUZUMAB GOVITECAN [at 80% dose]   DIAGNOSIS: Right breast cancer triple negative      Carcinoma of upper-inner quadrant of right breast in female, estrogen receptor negative ([place]  10/07/2019 Initial Diagnosis   Carcinoma of upper-inner quadrant of right breast in female, estrogen receptor negative ([place]   12/04/2019 Genetic Testing   Negative genetic testing. No pathogenic variants identified on the [facility] Common Hereditary Cancers Panel + Skin Cancers Panel. The report date is 12/04/2019.  The Common Hereditary Cancers Panel + Skin Cancers Panel offered by [facility] includes sequencing and/or deletion duplication testing of the following 54 genes: APC*, ATM*, AXIN2, BAP1, BARD1, BMPR1A, BRCA1, BRCA2, BRIP1, CDH1, CDK4, CDKN2A (p14ARF), CDKN2A (p16INK4a), CHEK2, CTNNA1, DICER1*, EPCAM*, GREM1*, HOXB13, KIT, MEN1*, MITF*, MLH1*, MSH2*, MSH3*, MSH6*, MUTYH, NBN, NF1*, NTHL1, PALB2, PDGFRA, PMS2*, POLD1*, POLE, POT1, PTCH1, PTEN*, RAD50,  RAD51C, RAD51D, RB1*, RNF43, SDHA*, SDHB, SDHC*, SDHD, SMAD4, SMARCA4, STK11, SUFU, TP53, TSC1*, TSC2, VHL.    05/07/2020 Cancer Staging   Staging form: Breast, AJCC  8th Edition - Clinical: Stage IIIB (cT2, cN1, cM0, G3, ER-, PR-, HER2-) - Signed by Cammie Sickle, MD on 05/07/2020   04/18/2021 Cancer Staging   Staging form: Breast, AJCC 8th Edition - Pathologic: Stage IV (pT4d, pN3c, cM1, ER+, PR-, HER2-) - Signed by Cammie Sickle, MD on 04/18/2021 Nuclear grade: G3      HISTORY OF PRESENTING ILLNESS: Accompanied by husband.  Walking with walker.  Ashlee Cox 71 y.o.  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   + back/hip pain, she is on fentanyl patch 25 mcg/hr, hydrocodone 5/325 mg every 6 hours as needed.  Patient reports pressure relief of the pain. awaiting start radiation. + Fatigue, no nausea vomiting diarrhea.  Review of Systems  Constitutional:  Positive for malaise/fatigue. Negative for chills, diaphoresis, fever and weight loss.  HENT:  Negative for nosebleeds and sore throat.   Eyes:  Negative for double vision.  Respiratory:  Negative for cough, hemoptysis, sputum production and wheezing.   Cardiovascular:  Negative for chest pain, palpitations, orthopnea and leg swelling.  Gastrointestinal:  Negative for abdominal pain, blood in stool, constipation and melena.  Genitourinary:  Negative for dysuria, frequency and urgency.  Musculoskeletal:  Positive for back pain and neck pain. Negative for joint pain.  Neurological:  Positive for tingling. Negative for dizziness, focal weakness, weakness and headaches.  Endo/Heme/Allergies:  Does not bruise/bleed easily.  Psychiatric/Behavioral:  Negative for depression. The patient is not nervous/anxious and does not have insomnia.      MEDICAL HISTORY:  Past Medical History:  Diagnosis Date    Cancer [facility]) 2021   right breast   Cyst, breast    benign   Diabetes mellitus without complication ([place])    Family history of melanoma    Family history of ovarian cancer    GERD (gastroesophageal reflux disease)    H/O: rheumatic fever    History of colonoscopy 2010   done bc of bleeding,  normal , due back in 5 yrs ([place])   Hyperlipidemia    Hypertension    Menopause    at age 68   Personal history of chemotherapy 09/2019   RIGHT  INVASIVE MAMMARY CARCINOMA   Personal history of radiation therapy     SURGICAL HISTORY: Past Surgical History:  Procedure Laterality Date   APPENDECTOMY  2006   BREAST BIOPSY Right 09/22/2019   INVASIVE MAMMARY CARCINOMA   BREAST CYST ASPIRATION Left 1999   BREAST LUMPECTOMY Left 04/23/2020   surgery with NL and SN    BREAST LUMPECTOMY WITH NEEDLE LOCALIZATION Right 04/23/2020   Procedure: BREAST LUMPECTOMY WITH NEEDLE LOCALIZATION;  Surgeon: Robert Bellow, MD;  Location: [facility] ORS;  Service: General;  Laterality: Right;   BREAST LUMPECTOMY WITH SENTINEL LYMPH NODE BIOPSY Right 04/23/2020   Procedure: BREAST LUMPECTOMY WITH SENTINEL LYMPH NODE BX;  Surgeon: Robert Bellow, MD;  Location: [facility];  Service: General;  Laterality: Right;   CHOLECYSTECTOMY  2006   COLONOSCOPY     PORTACATH PLACEMENT Left 10/06/2019   Procedure: INSERTION PORT-A-CATH;  Surgeon: Robert Bellow, MD;  Location: [facility] ORS;  Service: General;  Laterality: Left;   SUBMUCOSAL TATTOO INJECTION Right 10/06/2019   Procedure: Right axillary TATTOO INJECTION;  Surgeon: Robert Bellow, MD;  Location: [facility] ORS;  Service: General;  Laterality: Right;   VAGINAL DELIVERY     x3    SOCIAL HISTORY: Social History  Socioeconomic History   Marital status: Married    Spouse name: Chrissie Noa   Number of children: 3   Years of education: Not on file   Highest education level: Not on file  Occupational History   Occupation: Glass blower/designer: [id number]  Tobacco Use    Smoking status: Former    Years: 1.00    Types: Cigarettes    Quit date: 12/26/2005    Years since quitting: 15.9   Smokeless tobacco: Never   Tobacco comments:    smoked for less than 1 years,  1 cig/day  Vaping Use   Vaping Use: Never used  Substance and Sexual Activity   Alcohol use: No   Drug use: No   Sexual activity: Never    Partners: Female  Other Topics Concern   Not on file  Social History Narrative   Widowed, March 2014; remarried.      [employer]; quit smoking 1ppw; no alcohol.       Social Determinants of Health   Financial Resource Strain: Medium Risk (05/25/2021)   Overall Financial Resource Strain (CARDIA)    Difficulty of Paying Living Expenses: Somewhat hard  Food Insecurity: No Food Insecurity (05/18/2021)   Hunger Vital Sign    Worried About Running Out of Food in the Last Year: Never true    Ran Out of Food in the Last Year: Never true  Transportation Needs: No Transportation Needs (05/18/2021)   PRAPARE - Hydrologist (Medical): No    Lack of Transportation (Non-Medical): No  Physical Activity: Inactive (05/18/2021)   Exercise Vital Sign    Days of Exercise per Week: 0 days    Minutes of Exercise per Session: 0 min  Stress: No Stress Concern Present (05/18/2021)   [place]    Feeling of Stress : Not at all  Social Connections: [place] (05/18/2021)   Social Connection and Isolation Panel [NHANES]    Frequency of Communication with Friends and Family: More than three times a week    Frequency of Social Gatherings with Friends and Family: More than three times a week    Attends Religious Services: More than 4 times per year    Active Member of [employer] or Organizations: Yes    Attends Music therapist: More than 4 times per year    Marital Status: Married  Human resources officer Violence: Not At Risk (05/04/2021)   Humiliation, Afraid, Rape,  and Kick questionnaire    Fear of Current or Ex-Partner: No    Emotionally Abused: No    Physically Abused: No    Sexually Abused: No    FAMILY HISTORY: Family History  Problem Relation Age of Onset   Cancer Mother 65       ovarian- died 4-5 years.    Heart disease Mother 42   Diabetes Mother    Stroke Father 52   Diabetes Father    Diabetes Sister    Melanoma Sister        survived   Breast cancer Neg Hx     ALLERGIES:  has No Known Allergies.  MEDICATIONS:  Current Outpatient Medications  Medication Sig Dispense Refill   acetaminophen (TYLENOL) 500 MG tablet Take 500 mg by mouth every 8 (eight) hours as needed for moderate pain.     calcium carbonate (OS-CAL) 600 MG TABS tablet Take 600 mg by mouth daily.     Continuous Blood Gluc Sensor (FREESTYLE  LIBRE 3 SENSOR) MISC Apply 1 each topically every 14 (fourteen) days. Place 1 sensor on the skin every 14 days. Use to check glucose continuously 6 each 3   fentaNYL (DURAGESIC) 25 MCG/HR Place 1 patch onto the skin every 3 (three) days. 10 patch 0   gabapentin (NEURONTIN) 100 MG capsule TAKE 2 CAPSULES BY MOUTH AT BEDTIME 180 capsule 0   glipiZIDE (GLUCOTROL XL) 10 MG 24 hr tablet Take 1 tablet by mouth once daily with breakfast 90 tablet 0   HYDROcodone-acetaminophen (NORCO/VICODIN) 5-325 MG tablet Take 1 tablet by mouth every 6 (six) hours as needed for moderate pain. 90 tablet 0   KLOR-CON M20 20 MEQ tablet Take 1 tablet by mouth twice daily 60 tablet 0   levothyroxine (SYNTHROID) 100 MCG tablet TAKE 1 TABLET BY MOUTH ONCE DAILY IN THE MORNING BEFORE BREAKFAST 90 tablet 0   lidocaine-prilocaine (EMLA) cream Apply 1 application topically as needed. Apply to port and cover with saran wrap 1-2 hours prior to port access 30 g 1   losartan-hydrochlorothiazide (HYZAAR) 50-12.5 MG tablet Take 1 tablet by mouth once daily 90 tablet 0   lovastatin (MEVACOR) 40 MG tablet TAKE 1 TABLET BY MOUTH AT BEDTIME 90 tablet 1   magnesium oxide  (MAG-OX) 400 (240 Mg) MG tablet Take 400 mg by mouth daily.     metFORMIN (GLUCOPHAGE) 1000 MG tablet TAKE 2 TABLETS BY MOUTH ONCE DAILY WITH BREAKFAST 180 tablet 0   Multiple Vitamin (MULTIVITAMIN) tablet Take 1 tablet by mouth daily.     omeprazole (PRILOSEC) 20 MG capsule Take 1 capsule by mouth once daily 90 capsule 0   ondansetron (ZOFRAN) 8 MG tablet One pill every 8 hours as needed for nausea/vomitting. 40 tablet 1   prochlorperazine (COMPAZINE) 10 MG tablet Take 1 tablet (10 mg total) by mouth every 6 (six) hours as needed for nausea or vomiting. 40 tablet 1   Semaglutide,0.25 or 0.5MG/DOS, (OZEMPIC, 0.25 OR 0.5 MG/DOSE,) 2 MG/1.5ML SOPN Inject 0.5 mg into the skin once a week. 1.5 mL 11   vitamin B-12 (CYANOCOBALAMIN) 100 MCG tablet Take 100 mcg by mouth daily.     diphenoxylate-atropine (LOMOTIL) 2.5-0.025 MG tablet Take 1 tablet by mouth 4 (four) times daily as needed for diarrhea or loose stools. Take it along with immodium (Patient not taking: Reported on 12/13/2021) 60 tablet 0   No current facility-administered medications for this visit.      Marland Kitchen  PHYSICAL EXAMINATION: ECOG PERFORMANCE STATUS: 0 - Asymptomatic  Vitals:   12/13/21 1023  BP: 121/71  Pulse: 85  Resp: 18  Temp: 97.7 F (36.5 C)     Filed Weights   12/13/21 1023  Weight: 182 lb 4.8 oz (82.7 kg)     Physical Exam HENT:     Head: Normocephalic and atraumatic.     Mouth/Throat:     Pharynx: No oropharyngeal exudate.  Eyes:     Pupils: Pupils are equal, round, and reactive to light.  Neck:     Comments: Right supraclavicular lymphadenopathy felt. 2-3 cm in size.  Cardiovascular:     Rate and Rhythm: Normal rate and regular rhythm.  Pulmonary:     Effort: Pulmonary effort is normal. No respiratory distress.     Breath sounds: Normal breath sounds. No wheezing.  Abdominal:     General: Bowel sounds are normal. There is no distension.     Palpations: Abdomen is soft. There is no mass.      Tenderness: There  is no abdominal tenderness. There is no guarding or rebound.  Musculoskeletal:        General: No tenderness. Normal range of motion.     Cervical back: Normal range of motion and neck supple.  Skin:    General: Skin is warm.  Neurological:     Mental Status: She is alert and oriented to person, place, and time.  Psychiatric:        Mood and Affect: Affect normal.      LABORATORY DATA:  I have reviewed the data as listed Lab Results  Component Value Date   WBC 8.6 12/13/2021   HGB 9.3 (L) 12/13/2021   HCT 29.3 (L) 12/13/2021   MCV 84.4 12/13/2021   PLT 353 12/13/2021   Recent Labs    11/30/21 0921 12/06/21 0944 12/13/21 1001  NA 137 135 135  K 3.6 3.8 4.0  CL 105 104 100  CO2 24 21* 26  GLUCOSE 193* 189* 145*  BUN _0 CREATININE 0.72 0.99 0.82  CALCIUM 9.5 8.8* 9.3  GFRNONAA >60 >60 >60  PROT 7.3 7.8 7.7  ALBUMIN 3.8 3.4* 3.1*  AST 40 38 39  ALT _1 ALKPHOS 123 190* 199*  BILITOT 0.7 0.8 0.6     RADIOGRAPHIC STUDIES: I have personally reviewed the radiological images as listed and agreed with the findings in the report. NM PET Image Restage (PS) Skull Base to Thigh (F-18 FDG)  Result Date: 12/05/2021 CLINICAL DATA:  Subsequent treatment strategy for breast cancer. EXAM: NUCLEAR MEDICINE PET SKULL BASE TO THIGH TECHNIQUE: 10.1 mCi F-18 FDG was injected intravenously. Full-ring PET imaging was performed from the skull base to thigh after the radiotracer. CT data was obtained and used for attenuation correction and anatomic localization. Fasting blood glucose: 127 mg/dl COMPARISON:  Bone scan 11/23/2021, CT chest abdomen pelvis 10/21/2021 and PET 04/15/2021. FINDINGS: Mediastinal blood pool activity: SUV max 1.6 Liver activity: SUV max NA NECK: No abnormal hypermetabolism. Incidental CT findings: None. CHEST: 1.9 cm right supraclavicular lymph node (2/52), SUV max 8.7, enlarged and increasingly hypermetabolic when compared with prior PET.  Patchy consolidation and slight pulmonary retraction in the apical segment right upper lobe, presumably radiation related and with associated mild hypermetabolism. Subpleural nodular consolidation in the medial left upper lobe is new from 10/21/2021 and measures 1.6 x 2.2 cm (2/69), SUV max, 3.3. Three hypermetabolic nodules in right breast measure up to 3.0 x 3.3 cm (2/101), SUV max 8.4. No hypermetabolic mediastinal, hilar, axillary or internal mammary lymph nodes. Incidental CT findings: Left subclavian Port-A-Cath terminates in the high right atrium. Atherosclerotic calcification of the aorta. Heart is at the upper limits of normal in size to mildly enlarged. No pericardial or pleural effusion. ABDOMEN/PELVIS: No abnormal hypermetabolism in the liver, adrenal glands, spleen or pancreas. No hypermetabolic lymph nodes. Incidental CT findings: Liver is unremarkable. Cholecystectomy. Adrenal glands are unremarkable. Subcentimeter low-attenuation lesion in the right kidney, too small to characterize. No specific follow-up necessary. Kidneys, spleen, pancreas, stomach and bowel are otherwise unremarkable with the exception of a small hiatal hernia. SKELETON: Multiple hypermetabolic osseous lesions, as described on bone scan 11/23/2021. Findings are largely new from PET 04/15/2021. Index new hypermetabolic lesion in the proximal right femur, SUV max 6.4. Incidental CT findings: Degenerative changes in the spine. Chronic bilateral L5 pars defects. IMPRESSION: 1. Three hypermetabolic right breast nodules with a right supraclavicular nodal metastasis and diffuse osseous metastatic disease. 2. Subpleural nodular consolidation in the apical left upper  lobe is new from 10/21/2021 and may be infectious/inflammatory in etiology or related to evolving radiation therapy. Please correlate clinically. Recommend attention on follow-up as metastatic disease cannot be definitively excluded. 3.  Aortic atherosclerosis (ICD10-I70.0).  Electronically Signed   By: Lorin Picket M.D.   On: 12/05/2021 08:38   NM Bone Scan Whole Body  Result Date: 11/24/2021 CLINICAL DATA:  Bone pain, RIGHT breast cancer, LEFT hip pain EXAM: NUCLEAR MEDICINE WHOLE BODY BONE SCAN TECHNIQUE: Whole body anterior and posterior images were obtained approximately 3 hours after intravenous injection of radiopharmaceutical. RADIOPHARMACEUTICALS:  20.02 mCi Technetium-17mMDP IV COMPARISON:  None Correlation: CT chest abdomen pelvis 10/21/2021 FINDINGS: Multiple foci of abnormal tracer uptake consistent with osseous metastatic disease. These include distal sternum, thoracic spine, BILATERAL ribs, pelvis, and BILATERAL femora. Uptake within LEFT lateral cervical spine and lower lumbar spine could be degenerative less likely metastatic. Degenerative type uptake of tracer at hips and knees. Questionable metastatic focus LEFT parietal. Nonspecific tracer uptake in the breast bilaterally. Otherwise expected urinary tract and soft tissue distribution of tracer. IMPRESSION: Scattered sites of abnormal osseous tracer uptake as above consistent with osseous metastases, including BILATERAL femora. Electronically Signed   By: MLavonia DanaM.D.   On: 11/24/2021 09:50   DG HIP UNILAT W OR W/O PELVIS 2-3 VIEWS LEFT  Result Date: 11/15/2021 CLINICAL DATA:  Left-sided hip pain EXAM: DG HIP (WITH OR WITHOUT PELVIS) 2-3V LEFT COMPARISON:  CT 10/21/2021 FINDINGS: SI joints are patent. Pubic symphysis and rami appear intact. No fracture or malalignment. Left hip joint space appears patent. 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 IMPRESSION: Negative. Electronically Signed   By: KDonavan FoilM.D.   On: 11/15/2021 18:24    ASSESSMENT & PLAN:   Cancer Staging  Carcinoma of upper-inner quadrant of right breast in female, estrogen receptor negative ([place] Staging form: Breast, AJCC 8th Edition - Clinical: Stage IIIB (cT2, cN1, cM0, G3, ER-, PR-,  HER2-) - Signed by BCammie Sickle MD on 05/07/2020 - Pathologic: Stage IV (pT4d, pN3c, cM1, ER+, PR-, HER2-) - Signed by BCammie Sickle MD on 04/18/2021 - Pathologic: No stage assigned - Unsigned   Carcinoma of upper-inner quadrant of right breast in female, estrogen receptor negative ([place] #Recurrent right breast cancer-s/p breast skin biopsy [FEB 2023- ER positive /PR HER2-0; however 2017-ER-NEGATIVE; -HER2 NEU-1+/LOW]; - recurrent- stage IV; NGS -FEB 2023- CPS =15%. Currently on Keytruda- taxol [2/13]; BONE SCAN- November 24, 2021- Scattered sites of abnormal osseous tracer uptake  consistent with osseous metastases, including BILATERAL femora.  Given the progressive disease discontinue Taxol Keytruda. 12/02/2021- PET SCAN-  Three hypermetabolic right breast nodules with a right supraclavicular nodal metastasis and diffuse osseous metastatic disease.  Subpleural nodular consolidation in the apical left upper lobe is new from 10/21/2021 and may be infectious/inflammatory in etiology or related to evolving radiation therapy.   #Labs are reviewed and discussed with patient. Proceed with with chemotherapy- sacituzumab govitecan- days 1 &8 q 21 days [at 20 % dose reduction].  monitor therapy closely for blood counts while on radiation.  Called in prescription for Lomotil; also for antiemetics.  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 CDonella Stade  Currently agrees to expedite radiation; plan start on 9/20.   #Malignancy-pain-recommend adding fentanyl patch 25 mcg  every 3 days; also add hydrocodone 1 pill every 6-8 hours.  As needed- continue current regimen  # HYPO [place] potassium 3.2 continue K-Dur 20 mg twice a day recommend inreasing fluid intake.-STABLE today 4.0.    # PN-1-2 sec to taxol-on Neurontin 200 qhs-STABLE.   # DM/poorly controlled- continue metformin 2000 mg q AM [Dr.Tullo]; on glipizide 10 mg XL. Continue follow up with  PCP.  On CGM.- FBG- 144-STABLE   # Iron def anemia-[Iron studies- July 2023]   # Mediport: Functioning.   # I reviewed the blood work- with the patient in detail; also reviewed the imaging independently [as summarized above]; and with the patient in detail.   Next zometa on 10/04  Hypomagnesemia IV magnesium 2 g x 1 today. Recommend patient to take Slow-Mag 1 tablet daily. Repeat magnesium level in 2 to 3 days with possible IV magnesium  # DISPOSITION: # 1 week-MD Dr.B Labs- cbc-cmp; Sacituzumab  All questions were answered. The patient/family knows to call the clinic with any problems, questions or concerns.   Earlie Server, MD 12/13/2021 7:20 PM

## 2021-12-13 NOTE — Assessment & Plan Note (Signed)
IV magnesium 2 g x 1 today. Recommend patient to take Slow-Mag 1 tablet daily. Repeat magnesium level in 2 to 3 days with possible IV magnesium

## 2021-12-14 ENCOUNTER — Ambulatory Visit: Payer: PPO

## 2021-12-14 ENCOUNTER — Other Ambulatory Visit: Payer: Self-pay | Admitting: Internal Medicine

## 2021-12-14 ENCOUNTER — Other Ambulatory Visit: Payer: Self-pay

## 2021-12-14 ENCOUNTER — Ambulatory Visit
Admission: RE | Admit: 2021-12-14 | Discharge: 2021-12-14 | Disposition: A | Discharge status: Home / Self Care | Payer: PPO | Source: Ambulatory Visit | Attending: Radiation Oncology | Admitting: Radiation Oncology

## 2021-12-14 DIAGNOSIS — Z5112 Encounter for antineoplastic immunotherapy: Secondary | ICD-10-CM | POA: Diagnosis not present

## 2021-12-14 DIAGNOSIS — Z171 Estrogen receptor negative status [ER-]: Secondary | ICD-10-CM

## 2021-12-14 DIAGNOSIS — Z510 Encounter for antineoplastic radiation therapy: Secondary | ICD-10-CM | POA: Diagnosis not present

## 2021-12-14 DIAGNOSIS — C7951 Secondary malignant neoplasm of bone: Secondary | ICD-10-CM | POA: Diagnosis not present

## 2021-12-14 DIAGNOSIS — C50911 Malignant neoplasm of unspecified site of right female breast: Secondary | ICD-10-CM | POA: Diagnosis not present

## 2021-12-14 LAB — RAD ONC ARIA SESSION SUMMARY
Course Elapsed Days: 0
Plan Fractions Treated to Date: 1
Plan Prescribed Dose Per Fraction: 3 Gy
Plan Total Fractions Prescribed: 10
Plan Total Prescribed Dose: 30 Gy
Reference Point Dosage Given to Date: 3 Gy
Reference Point Session Dosage Given: 3 Gy
Session Number: 1

## 2021-12-15 ENCOUNTER — Ambulatory Visit
Admission: RE | Admit: 2021-12-15 | Discharge: 2021-12-15 | Disposition: A | Discharge status: Home / Self Care | Payer: PPO | Source: Ambulatory Visit | Attending: Radiation Oncology | Admitting: Radiation Oncology

## 2021-12-15 ENCOUNTER — Other Ambulatory Visit: Payer: Self-pay

## 2021-12-15 DIAGNOSIS — D4861 Neoplasm of uncertain behavior of right breast: Secondary | ICD-10-CM

## 2021-12-15 DIAGNOSIS — Z5112 Encounter for antineoplastic immunotherapy: Secondary | ICD-10-CM | POA: Diagnosis not present

## 2021-12-15 LAB — RAD ONC ARIA SESSION SUMMARY
Course Elapsed Days: 1
Plan Fractions Treated to Date: 2
Plan Prescribed Dose Per Fraction: 3 Gy
Plan Total Fractions Prescribed: 10
Plan Total Prescribed Dose: 30 Gy
Reference Point Dosage Given to Date: 6 Gy
Reference Point Session Dosage Given: 3 Gy
Session Number: 2

## 2021-12-16 ENCOUNTER — Ambulatory Visit
Admission: RE | Admit: 2021-12-16 | Discharge: 2021-12-16 | Disposition: A | Discharge status: Home / Self Care | Payer: PPO | Source: Ambulatory Visit | Attending: Radiation Oncology | Admitting: Radiation Oncology

## 2021-12-16 ENCOUNTER — Other Ambulatory Visit: Payer: Self-pay

## 2021-12-16 ENCOUNTER — Ambulatory Visit: Payer: PPO

## 2021-12-16 ENCOUNTER — Inpatient Hospital Stay: Payer: PPO

## 2021-12-16 DIAGNOSIS — Z95828 Presence of other vascular implants and grafts: Secondary | ICD-10-CM

## 2021-12-16 DIAGNOSIS — D4861 Neoplasm of uncertain behavior of right breast: Secondary | ICD-10-CM

## 2021-12-16 DIAGNOSIS — Z5112 Encounter for antineoplastic immunotherapy: Secondary | ICD-10-CM | POA: Diagnosis not present

## 2021-12-16 LAB — RAD ONC ARIA SESSION SUMMARY
Course Elapsed Days: 2
Plan Fractions Treated to Date: 3
Plan Prescribed Dose Per Fraction: 3 Gy
Plan Total Fractions Prescribed: 10
Plan Total Prescribed Dose: 30 Gy
Reference Point Dosage Given to Date: 9 Gy
Reference Point Session Dosage Given: 3 Gy
Session Number: 3

## 2021-12-16 LAB — MAGNESIUM: Magnesium: 1.6 mg/dL — ABNORMAL LOW (ref 1.7–2.4)

## 2021-12-16 MED ORDER — MAGNESIUM SULFATE 2 GM/50ML IV SOLN
2.0000 g | Freq: Once | INTRAVENOUS | Status: AC
  Administered 2021-12-16: 2 g via INTRAVENOUS
  Filled 2021-12-16: qty 50

## 2021-12-16 MED ORDER — HEPARIN SOD (PORK) LOCK FLUSH 100 UNIT/ML IV SOLN
500.0000 [IU] | Freq: Once | INTRAVENOUS | Status: AC
  Administered 2021-12-16: 500 [IU] via INTRAVENOUS
  Filled 2021-12-16: qty 5

## 2021-12-16 MED ORDER — SODIUM CHLORIDE 0.9% FLUSH
10.0000 mL | Freq: Once | INTRAVENOUS | Status: AC
  Administered 2021-12-16: 10 mL via INTRAVENOUS
  Filled 2021-12-16: qty 10

## 2021-12-16 MED ORDER — SODIUM CHLORIDE 0.9 % IV SOLN
INTRAVENOUS | Status: DC
  Filled 2021-12-16: qty 250

## 2021-12-16 NOTE — Patient Instructions (Signed)
Magnesium Sulfate Injection What is this medication? MAGNESIUM SULFATE (mag NEE zee um SUL fate) prevents and treats low levels of magnesium in your body. It may also be used to prevent and treat seizures during pregnancy in people with high blood pressure disorders, such as preeclampsia or eclampsia. Magnesium plays an important role in maintaining the health of your muscles and nervous system. This medicine may be used for other purposes; ask your health care provider or pharmacist if you have questions. What should I tell my care team before I take this medication? They need to know if you have any of these conditions: Heart disease History of irregular heart beat Kidney disease An unusual or allergic reaction to magnesium sulfate, medications, foods, dyes, or preservatives Pregnant or trying to get pregnant Breast-feeding How should I use this medication? This medication is for infusion into a vein. It is given in a hospital or clinic setting. Talk to your care team about the use of this medication in children. While this medication may be prescribed for selected conditions, precautions do apply. Overdosage: If you think you have taken too much of this medicine contact a poison control center or emergency room at once. NOTE: This medicine is only for you. Do not share this medicine with others. What if I miss a dose? This does not apply. What may interact with this medication? Certain medications for anxiety or sleep Certain medications for seizures like phenobarbital Digoxin Medications that relax muscles for surgery Narcotic medications for pain This list may not describe all possible interactions. Give your health care provider a list of all the medicines, herbs, non-prescription drugs, or dietary supplements you use. Also tell them if you smoke, drink alcohol, or use illegal drugs. Some items may interact with your medicine. What should I watch for while using this medication? Your  condition will be monitored carefully while you are receiving this medication. You may need blood work done while you are receiving this medication. What side effects may I notice from receiving this medication? Side effects that you should report to your care team as soon as possible: Allergic reactions--skin rash, itching, hives, swelling of the face, lips, tongue, or throat High magnesium level--confusion, drowsiness, facial flushing, redness, sweating, muscle weakness, fast or irregular heartbeat, trouble breathing Low blood pressure--dizziness, feeling faint or lightheaded, blurry vision Side effects that usually do not require medical attention (report to your care team if they continue or are bothersome): Headache Nausea This list may not describe all possible side effects. Call your doctor for medical advice about side effects. You may report side effects to FDA at 1-800-FDA-1088. Where should I keep my medication? This medication is given in a hospital or clinic and will not be stored at home. NOTE: This sheet is a summary. It may not cover all possible information. If you have questions about this medicine, talk to your doctor, pharmacist, or health care provider.  2023 [employer]vier/Gold Standard (2020-05-20 00:00:00)  

## 2021-12-19 ENCOUNTER — Ambulatory Visit
Admission: RE | Admit: 2021-12-19 | Discharge: 2021-12-19 | Disposition: A | Discharge status: Home / Self Care | Payer: PPO | Source: Ambulatory Visit | Attending: Radiation Oncology | Admitting: Radiation Oncology

## 2021-12-19 ENCOUNTER — Other Ambulatory Visit: Payer: Self-pay

## 2021-12-19 DIAGNOSIS — Z5112 Encounter for antineoplastic immunotherapy: Secondary | ICD-10-CM | POA: Diagnosis not present

## 2021-12-19 LAB — RAD ONC ARIA SESSION SUMMARY
Course Elapsed Days: 5
Plan Fractions Treated to Date: 4
Plan Prescribed Dose Per Fraction: 3 Gy
Plan Total Fractions Prescribed: 10
Plan Total Prescribed Dose: 30 Gy
Reference Point Dosage Given to Date: 12 Gy
Reference Point Session Dosage Given: 3 Gy
Session Number: 4

## 2021-12-19 MED FILL — Dexamethasone Sodium Phosphate Inj 100 MG/10ML: INTRAMUSCULAR | Qty: 1 | Status: AC

## 2021-12-19 MED FILL — Fosaprepitant Dimeglumine For IV Infusion 150 MG (Base Eq): INTRAVENOUS | Qty: 5 | Status: AC

## 2021-12-20 ENCOUNTER — Ambulatory Visit
Admission: RE | Admit: 2021-12-20 | Discharge: 2021-12-20 | Disposition: A | Discharge status: Home / Self Care | Payer: PPO | Source: Ambulatory Visit | Attending: Radiation Oncology | Admitting: Radiation Oncology

## 2021-12-20 ENCOUNTER — Other Ambulatory Visit: Payer: Self-pay

## 2021-12-20 ENCOUNTER — Inpatient Hospital Stay: Payer: PPO

## 2021-12-20 ENCOUNTER — Inpatient Hospital Stay (HOSPITAL_BASED_OUTPATIENT_CLINIC_OR_DEPARTMENT_OTHER): Payer: PPO | Admitting: Internal Medicine

## 2021-12-20 ENCOUNTER — Ambulatory Visit: Payer: PPO | Admitting: Nurse Practitioner

## 2021-12-20 VITALS — BP 115/74 | HR 82

## 2021-12-20 VITALS — BP 104/65 | HR 94 | Temp 99.2°F | Resp 16 | Ht 67.0 in | Wt 180.2 lb

## 2021-12-20 DIAGNOSIS — Z5112 Encounter for antineoplastic immunotherapy: Secondary | ICD-10-CM | POA: Diagnosis not present

## 2021-12-20 DIAGNOSIS — Z171 Estrogen receptor negative status [ER-]: Secondary | ICD-10-CM

## 2021-12-20 DIAGNOSIS — D4861 Neoplasm of uncertain behavior of right breast: Secondary | ICD-10-CM | POA: Diagnosis not present

## 2021-12-20 DIAGNOSIS — C50211 Malignant neoplasm of upper-inner quadrant of right female breast: Secondary | ICD-10-CM | POA: Diagnosis not present

## 2021-12-20 DIAGNOSIS — Z510 Encounter for antineoplastic radiation therapy: Secondary | ICD-10-CM | POA: Diagnosis not present

## 2021-12-20 DIAGNOSIS — C50911 Malignant neoplasm of unspecified site of right female breast: Secondary | ICD-10-CM | POA: Diagnosis not present

## 2021-12-20 DIAGNOSIS — C7951 Secondary malignant neoplasm of bone: Secondary | ICD-10-CM | POA: Diagnosis not present

## 2021-12-20 LAB — CBC WITH DIFFERENTIAL/PLATELET
Abs Immature Granulocytes: 0.12 10*3/uL — ABNORMAL HIGH (ref 0.00–0.07)
Basophils Absolute: 0.1 10*3/uL (ref 0.0–0.1)
Basophils Relative: 1 %
Eosinophils Absolute: 0.2 10*3/uL (ref 0.0–0.5)
Eosinophils Relative: 3 %
HCT: 28.4 % — ABNORMAL LOW (ref 36.0–46.0)
Hemoglobin: 9.1 g/dL — ABNORMAL LOW (ref 12.0–15.0)
Immature Granulocytes: 2 %
Lymphocytes Relative: 15 %
Lymphs Abs: 0.9 10*3/uL (ref 0.7–4.0)
MCH: 26.7 pg (ref 26.0–34.0)
MCHC: 32 g/dL (ref 30.0–36.0)
MCV: 83.3 fL (ref 80.0–100.0)
Monocytes Absolute: 0.5 10*3/uL (ref 0.1–1.0)
Monocytes Relative: 8 %
Neutro Abs: 4.4 10*3/uL (ref 1.7–7.7)
Neutrophils Relative %: 71 %
Platelets: 341 10*3/uL (ref 150–400)
RBC: 3.41 MIL/uL — ABNORMAL LOW (ref 3.87–5.11)
RDW: 21.9 % — ABNORMAL HIGH (ref 11.5–15.5)
WBC: 6 10*3/uL (ref 4.0–10.5)
nRBC: 0 % (ref 0.0–0.2)

## 2021-12-20 LAB — RAD ONC ARIA SESSION SUMMARY
Course Elapsed Days: 6
Plan Fractions Treated to Date: 5
Plan Prescribed Dose Per Fraction: 3 Gy
Plan Total Fractions Prescribed: 10
Plan Total Prescribed Dose: 30 Gy
Reference Point Dosage Given to Date: 15 Gy
Reference Point Session Dosage Given: 3 Gy
Session Number: 5

## 2021-12-20 LAB — COMPREHENSIVE METABOLIC PANEL
ALT: 15 U/L (ref 0–44)
AST: 40 U/L (ref 15–41)
Albumin: 2.9 g/dL — ABNORMAL LOW (ref 3.5–5.0)
Alkaline Phosphatase: 191 U/L — ABNORMAL HIGH (ref 38–126)
Anion gap: 11 (ref 5–15)
BUN: 12 mg/dL (ref 8–23)
CO2: 23 mmol/L (ref 22–32)
Calcium: 9.4 mg/dL (ref 8.9–10.3)
Chloride: 101 mmol/L (ref 98–111)
Creatinine, Ser: 0.85 mg/dL (ref 0.44–1.00)
GFR, Estimated: >60 mL/min (ref >60)
Glucose, Bld: 202 mg/dL — ABNORMAL HIGH (ref 70–99)
Potassium: 4 mmol/L (ref 3.5–5.1)
Sodium: 135 mmol/L (ref 135–145)
Total Bilirubin: 0.1 mg/dL — ABNORMAL LOW (ref 0.3–1.2)
Total Protein: 7.4 g/dL (ref 6.5–8.1)

## 2021-12-20 LAB — MAGNESIUM: Magnesium: 1.7 mg/dL (ref 1.7–2.4)

## 2021-12-20 MED ORDER — SODIUM CHLORIDE 0.9 % IV SOLN
150.0000 mg | Freq: Once | INTRAVENOUS | Status: AC
  Administered 2021-12-20: 150 mg via INTRAVENOUS
  Filled 2021-12-20: qty 150

## 2021-12-20 MED ORDER — PALONOSETRON HCL INJECTION 0.25 MG/5ML
0.2500 mg | Freq: Once | INTRAVENOUS | Status: AC
  Administered 2021-12-20: 0.25 mg via INTRAVENOUS
  Filled 2021-12-20: qty 5

## 2021-12-20 MED ORDER — SODIUM CHLORIDE 0.9 % IV SOLN
Freq: Once | INTRAVENOUS | Status: AC
  Filled 2021-12-20: qty 250

## 2021-12-20 MED ORDER — DIPHENHYDRAMINE HCL 50 MG/ML IJ SOLN
50.0000 mg | Freq: Once | INTRAMUSCULAR | Status: AC
  Administered 2021-12-20: 50 mg via INTRAVENOUS
  Filled 2021-12-20: qty 1

## 2021-12-20 MED ORDER — SODIUM CHLORIDE 0.9 % IV SOLN
10.0000 mg | Freq: Once | INTRAVENOUS | Status: AC
  Administered 2021-12-20: 10 mg via INTRAVENOUS
  Filled 2021-12-20: qty 10

## 2021-12-20 MED ORDER — SODIUM CHLORIDE 0.9% FLUSH
10.0000 mL | INTRAVENOUS | Status: DC | PRN
  Administered 2021-12-20: 10 mL via INTRAVENOUS
  Filled 2021-12-20: qty 10

## 2021-12-20 MED ORDER — HEPARIN SOD (PORK) LOCK FLUSH 100 UNIT/ML IV SOLN
500.0000 [IU] | Freq: Once | INTRAVENOUS | Status: AC | PRN
  Administered 2021-12-20: 500 [IU]
  Filled 2021-12-20: qty 5

## 2021-12-20 MED ORDER — SODIUM CHLORIDE 0.9% FLUSH
10.0000 mL | INTRAVENOUS | Status: DC | PRN
  Administered 2021-12-20: 10 mL
  Filled 2021-12-20: qty 10

## 2021-12-20 MED ORDER — ACETAMINOPHEN 325 MG PO TABS
650.0000 mg | ORAL_TABLET | Freq: Once | ORAL | Status: AC
  Administered 2021-12-20: 650 mg via ORAL
  Filled 2021-12-20: qty 2

## 2021-12-20 MED ORDER — FAMOTIDINE IN NACL 20-0.9 MG/50ML-% IV SOLN
20.0000 mg | Freq: Once | INTRAVENOUS | Status: AC
  Administered 2021-12-20: 20 mg via INTRAVENOUS
  Filled 2021-12-20: qty 50

## 2021-12-20 MED ORDER — SODIUM CHLORIDE 0.9 % IV SOLN
8.0000 mg/kg | Freq: Once | INTRAVENOUS | Status: AC
  Administered 2021-12-20: 680 mg via INTRAVENOUS
  Filled 2021-12-20: qty 68

## 2021-12-20 NOTE — Progress Notes (Signed)
Pain left side low back/hip pain is improving with current XRT.

## 2021-12-20 NOTE — Patient Instructions (Signed)
[facility] [place]  Discharge Instructions: Thank you for choosing [facility] to provide your oncology and hematology care.  If you have a lab appointment with the [facility], please go directly to the [facility] and check in at the registration area.  Wear comfortable clothing and clothing appropriate for easy access to any Portacath or PICC line.   We strive to give you quality time with your provider. You may need to reschedule your appointment if you arrive late (15 or more minutes).  Arriving late affects you and other patients whose appointments are after yours.  Also, if you miss three or more appointments without notifying the office, you may be dismissed from the clinic at the provider's discretion.      For prescription refill requests, have your pharmacy contact our office and allow 72 hours for refills to be completed.    Today you received the following chemotherapy and/or immunotherapy agents: Sacituzumab      To help prevent nausea and vomiting after your treatment, we encourage you to take your nausea medication as directed.  BELOW ARE SYMPTOMS THAT SHOULD BE REPORTED IMMEDIATELY: *FEVER GREATER THAN 100.4 F (38 C) OR HIGHER *CHILLS OR SWEATING *NAUSEA AND VOMITING THAT IS NOT CONTROLLED WITH YOUR NAUSEA MEDICATION *UNUSUAL SHORTNESS OF BREATH *UNUSUAL BRUISING OR BLEEDING *URINARY PROBLEMS (pain or burning when urinating, or frequent urination) *BOWEL PROBLEMS (unusual diarrhea, constipation, pain near the anus) TENDERNESS IN MOUTH AND THROAT WITH OR WITHOUT PRESENCE OF ULCERS (sore throat, sores in mouth, or a toothache) UNUSUAL RASH, SWELLING OR PAIN  UNUSUAL VAGINAL DISCHARGE OR ITCHING   Items with * indicate a potential emergency and should be followed up as soon as possible or go to the Emergency Department if any problems should occur.  Please show the CHEMOTHERAPY ALERT CARD or IMMUNOTHERAPY ALERT CARD at check-in  to the Emergency Department and triage nurse.  Should you have questions after your visit or need to cancel or reschedule your appointment, please contact [facility] [place] [facility] [place]  [phone] and follow the prompts.  Office hours are 8:00 a.m. to 4:30 p.m. Monday - Friday. Please note that voicemails left after 4:00 p.m. may not be returned until the following business day.  We are closed weekends and major holidays. You have access to a nurse at all times for urgent questions. Please call the main number to the clinic [phone] and follow the prompts.  For any non-urgent questions, you may also contact your provider using MyChart. We now offer e-Visits for anyone 38 and older to request care online for non-urgent symptoms. For details visit [email or web address].   Also download the MyChart app! Go to the app store, search "MyChart", open the app, select [facility], and log in with your MyChart username and password.  Masks are optional in the cancer centers. If you would like for your care team to wear a mask while they are taking care of you, please let them know. For doctor visits, patients may have with them one support person who is at least 69 years old. At this time, visitors are not allowed in the infusion area.

## 2021-12-20 NOTE — Progress Notes (Signed)
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 Byrnett.] [id number] Carbo-taxol-AC;  JAN 2022-s/p Lumpectomy & ALND- [ypT1a (65m ) ypN1 (2/11 LN-positive)]; s/p radiation end of April 2022.  #Early June 2022-Xeloda 1000 mg per metered square [2000] 2 weeks on 1 week of 6-8 cycles; July 11th 2022- STARTING cycle #3-cut down the dose to 1500 mg BID[sec to HFS].   # JAN 2023- A. SKIN, RIGHT BREAST; PUNCH BIOPSY: [Dr.Byrnett] - INVASIVE CARCINOMA WITH DERMAL INFILTRATION AND LYMPHATIC INVASION. ER- weak POSISTIVE [10%]; PR/her2 NEG [[facility] 0]; JAN 20th, 2023- IMPRESSION: 1. Although the hypermetabolic right axillary node has resolved since the prior PET, there is a new hypermetabolic right low cervical/supraclavicular node, suspicious for nodal metastasis. 2. Decrease in right breast hypermetabolism with nonspecific overlying skin thickening in the setting of prior lumpectomy. 3. Low-level hypermetabolism within the T2 vertebral body, suspicious for isolated osseous metastasis. This could be confirmed with pre and postcontrast thoracic spine MRI. 4. Hypermetabolic thoracic nodes, slightly progressive. Given progression, nodal metastasis are slightly favored over reactive etiologies. 5. Incidental findings, including: Aortic Atherosclerosis (ICD10-I70.0). Tiny hiatal hernia. Hepatic steatosis with nonspecific caudate lobe enlargemen  Comment:  The carcinoma is  morphologically similar to the post-neoadjuvant mammary  carcinoma seen in the January 2022 right breast wide excision and lymph  Nodes  # FEB 6th-Taxol single agent #1; FEB 13th, 2023- [CPS =15]; Taxol-Keytruda  MAY 2023- CT CHEST:  Since the PET of 04/15/2021, enlargement of the previously described hypermetabolic right low cervical/supraclavicular nodal metastasis. Foci of hyperenhancement within the right breast are new or more well-defined since 02/02/2021, suspicious for progressive residual/recurrent disease. Nonspecific mediastinal nodes, similar to slightly decreased in size;  New sclerosis at the site of a known T2 vertebral body metastasis.   # s/p palliative radiation therapy to her right supraclavicular nodal metastasis as well as T2 vertebral body [30 Gray in 10 fractions ;[5/31 to  6/13].  CONTINUED TAXOL-KEYTRUDA; SEP 6th 2023-bone scan progressive disease.  Discontinue Taxol-Keytruda  #  SEP 19th 2023- SACITUZUMAB GOVITECAN [at 80% dose]   DIAGNOSIS: Right breast cancer triple negative      Carcinoma of upper-inner quadrant of right breast in female, estrogen receptor negative ([place]  10/07/2019 Initial Diagnosis   Carcinoma of upper-inner quadrant of right breast in female, estrogen receptor negative ([place]   12/04/2019 Genetic Testing   Negative genetic testing. No pathogenic variants identified on the [facility] Common Hereditary Cancers Panel + Skin Cancers Panel. The report date is 12/04/2019.  The Common Hereditary Cancers Panel + Skin Cancers Panel offered by [facility] includes sequencing and/or deletion duplication testing of the following 54 genes: APC*, ATM*, AXIN2, BAP1, BARD1, BMPR1A, BRCA1, BRCA2, BRIP1, CDH1, CDK4, CDKN2A (p14ARF), CDKN2A (p16INK4a), CHEK2, CTNNA1, DICER1*, EPCAM*, GREM1*, HOXB13, KIT, MEN1*, MITF*, MLH1*, MSH2*, MSH3*, MSH6*, MUTYH, NBN, NF1*, NTHL1, PALB2, PDGFRA, PMS2*, POLD1*, POLE, POT1, PTCH1, PTEN*, RAD50, RAD51C, RAD51D, RB1*, RNF43, SDHA*, SDHB,  SDHC*, SDHD, SMAD4, SMARCA4, STK11, SUFU, TP53, TSC1*, TSC2, VHL.    05/07/2020 Cancer Staging   Staging form: Breast, AJCC 8th Edition -  Clinical: Stage IIIB (cT2, cN1, cM0, G3, ER-, PR-, HER2-) - Signed by Cammie Sickle, MD on 05/07/2020   04/18/2021 Cancer Staging   Staging form: Breast, AJCC 8th Edition - Pathologic: Stage IV (pT4d, pN3c, cM1, ER+, PR-, HER2-) - Signed by Cammie Sickle, MD on 04/18/2021 Nuclear grade: G3      HISTORY OF PRESENTING ILLNESS: Accompanied by husband.  Walking with walker.  Ashlee Cox 71 y.o.  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  Patient currently on radiation to left hips for worsening pain/metastatic disease.  Patient hip pain is improved.  She is walking independently.  She is taking hydrocodone once a day as needed.  She is on the fentanyl patch.  Denies diarrhea.  Denies any sores in the mouth.  Complains of mild to moderate fatigue.  Admits to compliance with her Synthroid.  Review of Systems  Constitutional:  Positive for malaise/fatigue. Negative for chills, diaphoresis, fever and weight loss.  HENT:  Negative for nosebleeds and sore throat.   Eyes:  Negative for double vision.  Respiratory:  Negative for cough, hemoptysis, sputum production and wheezing.   Cardiovascular:  Negative for chest pain, palpitations, orthopnea and leg swelling.  Gastrointestinal:  Negative for abdominal pain, blood in stool, constipation and melena.  Genitourinary:  Negative for dysuria, frequency and urgency.  Musculoskeletal:  Positive for back pain and neck pain. Negative for joint pain.  Neurological:  Positive for tingling. Negative for dizziness, focal weakness, weakness and headaches.  Endo/Heme/Allergies:  Does not bruise/bleed easily.  Psychiatric/Behavioral:  Negative for depression. The patient is not nervous/anxious and does not have  insomnia.      MEDICAL HISTORY:  Past Medical History:  Diagnosis Date   Cancer [facility]) 2021   right breast   Cyst, breast    benign   Diabetes mellitus without complication ([place])    Family history of melanoma    Family history of ovarian cancer    GERD (gastroesophageal reflux disease)    H/O: rheumatic fever    History of colonoscopy 2010   done bc of bleeding,  normal , due back in 5 yrs ([place])   Hyperlipidemia    Hypertension    Menopause    at age 32   Personal history of chemotherapy 09/2019   RIGHT  INVASIVE MAMMARY CARCINOMA   Personal history of radiation therapy     SURGICAL HISTORY: Past Surgical History:  Procedure Laterality Date   APPENDECTOMY  2006   BREAST BIOPSY Right 09/22/2019   INVASIVE MAMMARY CARCINOMA   BREAST CYST ASPIRATION Left 1999   BREAST LUMPECTOMY Left 04/23/2020   surgery with NL and SN    BREAST LUMPECTOMY WITH NEEDLE LOCALIZATION Right 04/23/2020   Procedure: BREAST LUMPECTOMY WITH NEEDLE LOCALIZATION;  Surgeon: Robert Bellow, MD;  Location: [facility] ORS;  Service: General;  Laterality: Right;   BREAST LUMPECTOMY WITH SENTINEL LYMPH NODE BIOPSY Right 04/23/2020   Procedure: BREAST LUMPECTOMY WITH SENTINEL LYMPH NODE BX;  Surgeon: Robert Bellow, MD;  Location: [facility];  Service: General;  Laterality: Right;   CHOLECYSTECTOMY  2006   COLONOSCOPY     PORTACATH PLACEMENT Left 10/06/2019   Procedure: INSERTION PORT-A-CATH;  Surgeon: Robert Bellow, MD;  Location: [facility] ORS;  Service: General;  Laterality: Left;   SUBMUCOSAL TATTOO INJECTION Right 10/06/2019   Procedure: Right axillary TATTOO INJECTION;  Surgeon: Robert Bellow, MD;  Location: [facility] ORS;  Service:  General;  Laterality: Right;   VAGINAL DELIVERY     x3    SOCIAL HISTORY: Social History   Socioeconomic History   Marital status: Married    Spouse name: Chrissie Noa   Number of children: 3   Years of education: Not on file   Highest education level: Not on file   Occupational History   Occupation: Glass blower/designer: [employer]  Tobacco Use   Smoking status: Former    Years: 1.00    Types: Cigarettes    Quit date: 12/26/2005    Years since quitting: 15.9   Smokeless tobacco: Never   Tobacco comments:    smoked for less than 1 years,  1 cig/day  Vaping Use   Vaping Use: Never used  Substance and Sexual Activity   Alcohol use: No   Drug use: No   Sexual activity: Never    Partners: Female  Other Topics Concern   Not on file  Social History Narrative   Widowed, March 2014; remarried.      [employer]; quit smoking 1ppw; no alcohol.       Social Determinants of Health   Financial Resource Strain: Medium Risk (05/25/2021)   Overall Financial Resource Strain (CARDIA)    Difficulty of Paying Living Expenses: Somewhat hard  Food Insecurity: No Food Insecurity (05/18/2021)   Hunger Vital Sign    Worried About Running Out of Food in the Last Year: Never true    Ran Out of Food in the Last Year: Never true  Transportation Needs: No Transportation Needs (05/18/2021)   PRAPARE - Hydrologist (Medical): No    Lack of Transportation (Non-Medical): No  Physical Activity: Inactive (05/18/2021)   Exercise Vital Sign    Days of Exercise per Week: 0 days    Minutes of Exercise per Session: 0 min  Stress: No Stress Concern Present (05/18/2021)   [place]    Feeling of Stress : Not at all  Social Connections: [place] (05/18/2021)   Social Connection and Isolation Panel [NHANES]    Frequency of Communication with Friends and Family: More than three times a week    Frequency of Social Gatherings with Friends and Family: More than three times a week    Attends Religious Services: More than 4 times per year    Active Member of [employer] or Organizations: Yes    Attends Music therapist: More than 4 times per year    Marital Status: Married   Human resources officer Violence: Not At Risk (05/04/2021)   Humiliation, Afraid, Rape, and Kick questionnaire    Fear of Current or Ex-Partner: No    Emotionally Abused: No    Physically Abused: No    Sexually Abused: No    FAMILY HISTORY: Family History  Problem Relation Age of Onset   Cancer Mother 75       ovarian- died 4-5 years.    Heart disease Mother 93   Diabetes Mother    Stroke Father 40   Diabetes Father    Diabetes Sister    Melanoma Sister        survived   Breast cancer Neg Hx     ALLERGIES:  has No Known Allergies.  MEDICATIONS:  Current Outpatient Medications  Medication Sig Dispense Refill   acetaminophen (TYLENOL) 500 MG tablet Take 500 mg by mouth every 8 (eight) hours as needed for moderate pain.  calcium carbonate (OS-CAL) 600 MG TABS tablet Take 600 mg by mouth daily.     Continuous Blood Gluc Sensor (FREESTYLE LIBRE 3 SENSOR) MISC Apply 1 each topically every 14 (fourteen) days. Place 1 sensor on the skin every 14 days. Use to check glucose continuously 6 each 3   fentaNYL (DURAGESIC) 25 MCG/HR Place 1 patch onto the skin every 3 (three) days. 10 patch 0   gabapentin (NEURONTIN) 100 MG capsule TAKE 2 CAPSULES BY MOUTH AT BEDTIME 180 capsule 0   glipiZIDE (GLUCOTROL XL) 10 MG 24 hr tablet Take 1 tablet by mouth once daily with breakfast 90 tablet 0   HYDROcodone-acetaminophen (NORCO/VICODIN) 5-325 MG tablet Take 1 tablet by mouth every 6 (six) hours as needed for moderate pain. 90 tablet 0   KLOR-CON M20 20 MEQ tablet Take 1 tablet by mouth twice daily 60 tablet 0   levothyroxine (SYNTHROID) 100 MCG tablet TAKE 1 TABLET BY MOUTH ONCE DAILY IN THE MORNING BEFORE BREAKFAST 90 tablet 0   lidocaine-prilocaine (EMLA) cream Apply 1 application topically as needed. Apply to port and cover with saran wrap 1-2 hours prior to port access 30 g 1   losartan-hydrochlorothiazide (HYZAAR) 50-12.5 MG tablet Take 1 tablet by mouth once daily 90 tablet 0   lovastatin (MEVACOR)  40 MG tablet TAKE 1 TABLET BY MOUTH AT BEDTIME 90 tablet 1   magnesium oxide (MAG-OX) 400 (240 Mg) MG tablet Take 400 mg by mouth daily.     metFORMIN (GLUCOPHAGE) 1000 MG tablet TAKE 2 TABLETS BY MOUTH ONCE DAILY WITH BREAKFAST 180 tablet 0   Multiple Vitamin (MULTIVITAMIN) tablet Take 1 tablet by mouth daily.     omeprazole (PRILOSEC) 20 MG capsule Take 1 capsule by mouth once daily 90 capsule 0   ondansetron (ZOFRAN) 8 MG tablet One pill every 8 hours as needed for nausea/vomitting. 40 tablet 1   prochlorperazine (COMPAZINE) 10 MG tablet Take 1 tablet (10 mg total) by mouth every 6 (six) hours as needed for nausea or vomiting. 40 tablet 1   Semaglutide,0.25 or 0.5MG/DOS, (OZEMPIC, 0.25 OR 0.5 MG/DOSE,) 2 MG/1.5ML SOPN Inject 0.5 mg into the skin once a week. 1.5 mL 11   vitamin B-12 (CYANOCOBALAMIN) 100 MCG tablet Take 100 mcg by mouth daily.     diphenoxylate-atropine (LOMOTIL) 2.5-0.025 MG tablet Take 1 tablet by mouth 4 (four) times daily as needed for diarrhea or loose stools. Take it along with immodium (Patient not taking: Reported on 12/13/2021) 60 tablet 0   No current facility-administered medications for this visit.   Facility-Administered Medications Ordered in Other Visits  Medication Dose Route Frequency Provider Last Rate Last Admin   acetaminophen (TYLENOL) tablet 650 mg  650 mg Oral Once Cammie Sickle, MD       dexamethasone (DECADRON) 10 mg in sodium chloride 0.9 % 50 mL IVPB  10 mg Intravenous Once Cammie Sickle, MD       diphenhydrAMINE (BENADRYL) injection 50 mg  50 mg Intravenous Once Cammie Sickle, MD       famotidine (PEPCID) IVPB 20 mg premix  20 mg Intravenous Once Charlaine Dalton R, MD       fosaprepitant (EMEND) 150 mg in sodium chloride 0.9 % 145 mL IVPB  150 mg Intravenous Once Cammie Sickle, MD 450 mL/hr at 12/20/21 0952 150 mg at 12/20/21 0952   heparin lock flush 100 unit/mL  500 Units Intracatheter Once PRN Cammie Sickle, MD  palonosetron (ALOXI) injection 0.25 mg  0.25 mg Intravenous Once Cammie Sickle, MD       sacituzumab govitecan-hziy (TRODELVY) 680 mg in sodium chloride 0.9 % 250 mL (2.1384 mg/mL) chemo infusion  8 mg/kg (Treatment Plan Recorded) Intravenous Once Charlaine Dalton R, MD       sodium chloride flush (NS) 0.9 % injection 10 mL  10 mL Intracatheter PRN Cammie Sickle, MD          .  PHYSICAL EXAMINATION: ECOG PERFORMANCE STATUS: 0 - Asymptomatic  Vitals:   12/20/21 0900  BP: 104/65  Pulse: 94  Resp: 16  Temp: 99.2 F (37.3 C)     Filed Weights   12/20/21 0900  Weight: 180 lb 3.2 oz (81.7 kg)     Right supraclavicular lymphadenopathy felt. 2-3 cm in size.   Physical Exam HENT:     Head: Normocephalic and atraumatic.     Mouth/Throat:     Pharynx: No oropharyngeal exudate.  Eyes:     Pupils: Pupils are equal, round, and reactive to light.  Cardiovascular:     Rate and Rhythm: Normal rate and regular rhythm.  Pulmonary:     Effort: Pulmonary effort is normal. No respiratory distress.     Breath sounds: Normal breath sounds. No wheezing.  Abdominal:     General: Bowel sounds are normal. There is no distension.     Palpations: Abdomen is soft. There is no mass.     Tenderness: There is no abdominal tenderness. There is no guarding or rebound.  Musculoskeletal:        General: No tenderness. Normal range of motion.     Cervical back: Normal range of motion and neck supple.  Skin:    General: Skin is warm.  Neurological:     Mental Status: She is alert and oriented to person, place, and time.  Psychiatric:        Mood and Affect: Affect normal.      LABORATORY DATA:  I have reviewed the data as listed Lab Results  Component Value Date   WBC 6.0 12/20/2021   HGB 9.1 (L) 12/20/2021   HCT 28.4 (L) 12/20/2021   MCV 83.3 12/20/2021   PLT 341 12/20/2021   Recent Labs    12/06/21 0944 12/13/21 1001 12/20/21 0847  NA 135 135 135  K  3.8 4.0 4.0  CL 104 100 101  CO2 21* 26 23  GLUCOSE 189* 145* 202*  BUN _0 CREATININE 0.99 0.82 0.85  CALCIUM 8.8* 9.3 9.4  GFRNONAA >60 >60 >60  PROT 7.8 7.7 7.4  ALBUMIN 3.4* 3.1* 2.9*  AST 38 39 40  ALT _1 ALKPHOS 190* 199* 191*  BILITOT 0.8 0.6 0.1*    RADIOGRAPHIC STUDIES: I have personally reviewed the radiological images as listed and agreed with the findings in the report. NM PET Image Restage (PS) Skull Base to Thigh (F-18 FDG)  Result Date: 12/05/2021 CLINICAL DATA:  Subsequent treatment strategy for breast cancer. EXAM: NUCLEAR MEDICINE PET SKULL BASE TO THIGH TECHNIQUE: 10.1 mCi F-18 FDG was injected intravenously. Full-ring PET imaging was performed from the skull base to thigh after the radiotracer. CT data was obtained and used for attenuation correction and anatomic localization. Fasting blood glucose: 127 mg/dl COMPARISON:  Bone scan 11/23/2021, CT chest abdomen pelvis 10/21/2021 and PET 04/15/2021. FINDINGS: Mediastinal blood pool activity: SUV max 1.6 Liver activity: SUV max NA NECK: No abnormal hypermetabolism. Incidental CT findings: None. CHEST:  1.9 cm right supraclavicular lymph node (2/52), SUV max 8.7, enlarged and increasingly hypermetabolic when compared with prior PET. Patchy consolidation and slight pulmonary retraction in the apical segment right upper lobe, presumably radiation related and with associated mild hypermetabolism. Subpleural nodular consolidation in the medial left upper lobe is new from 10/21/2021 and measures 1.6 x 2.2 cm (2/69), SUV max, 3.3. Three hypermetabolic nodules in right breast measure up to 3.0 x 3.3 cm (2/101), SUV max 8.4. No hypermetabolic mediastinal, hilar, axillary or internal mammary lymph nodes. Incidental CT findings: Left subclavian Port-A-Cath terminates in the high right atrium. Atherosclerotic calcification of the aorta. Heart is at the upper limits of normal in size to mildly enlarged. No pericardial or pleural  effusion. ABDOMEN/PELVIS: No abnormal hypermetabolism in the liver, adrenal glands, spleen or pancreas. No hypermetabolic lymph nodes. Incidental CT findings: Liver is unremarkable. Cholecystectomy. Adrenal glands are unremarkable. Subcentimeter low-attenuation lesion in the right kidney, too small to characterize. No specific follow-up necessary. Kidneys, spleen, pancreas, stomach and bowel are otherwise unremarkable with the exception of a small hiatal hernia. SKELETON: Multiple hypermetabolic osseous lesions, as described on bone scan 11/23/2021. Findings are largely new from PET 04/15/2021. Index new hypermetabolic lesion in the proximal right femur, SUV max 6.4. Incidental CT findings: Degenerative changes in the spine. Chronic bilateral L5 pars defects. IMPRESSION: 1. Three hypermetabolic right breast nodules with a right supraclavicular nodal metastasis and diffuse osseous metastatic disease. 2. Subpleural nodular consolidation in the apical left upper lobe is new from 10/21/2021 and may be infectious/inflammatory in etiology or related to evolving radiation therapy. Please correlate clinically. Recommend attention on follow-up as metastatic disease cannot be definitively excluded. 3.  Aortic atherosclerosis (ICD10-I70.0). Electronically Signed   By: Lorin Picket M.D.   On: 12/05/2021 08:38   NM Bone Scan Whole Body  Result Date: 11/24/2021 CLINICAL DATA:  Bone pain, RIGHT breast cancer, LEFT hip pain EXAM: NUCLEAR MEDICINE WHOLE BODY BONE SCAN TECHNIQUE: Whole body anterior and posterior images were obtained approximately 3 hours after intravenous injection of radiopharmaceutical. RADIOPHARMACEUTICALS:  20.02 mCi Technetium-32mMDP IV COMPARISON:  None Correlation: CT chest abdomen pelvis 10/21/2021 FINDINGS: Multiple foci of abnormal tracer uptake consistent with osseous metastatic disease. These include distal sternum, thoracic spine, BILATERAL ribs, pelvis, and BILATERAL femora. Uptake within LEFT  lateral cervical spine and lower lumbar spine could be degenerative less likely metastatic. Degenerative type uptake of tracer at hips and knees. Questionable metastatic focus LEFT parietal. Nonspecific tracer uptake in the breast bilaterally. Otherwise expected urinary tract and soft tissue distribution of tracer. IMPRESSION: Scattered sites of abnormal osseous tracer uptake as above consistent with osseous metastases, including BILATERAL femora. Electronically Signed   By: MLavonia DanaM.D.   On: 11/24/2021 09:50    ASSESSMENT & PLAN:   Carcinoma of upper-inner quadrant of right breast in female, estrogen receptor negative ([place] #Recurrent right breast cancer-s/p breast skin biopsy [FEB 2023- ER positive /PR HER2-0; however 2017-ER-NEGATIVE; -HER2 NEU-1+/LOW]; - recurrent- stage IV; NGS -FEB 2023- CPS =15%. Currently on Keytruda- taxol [2/13]; BONE SCAN- November 24, 2021- Scattered sites of abnormal osseous tracer uptake  consistent with osseous metastases, including BILATERAL femora.   12/02/2021- PET SCAN-  Three hypermetabolic right breast nodules with a right supraclavicular nodal metastasis and diffuse osseous metastatic disease.  Subpleural nodular consolidation in the apical left upper lobe is new from 10/21/2021 and may be infectious/inflammatory in etiology or related to evolving radiation therapy.   #Patient currently on Sacituzumab govitecan-status post cycle 1  day 1.  Proceed with cycle number 2-day 8 today.  Patient tolerating chemotherapy with mild to moderate difficulty...  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   # T2 thoracic metastatic disease [confirmed MRI Jan 2023]-currently s/p Radiation [ 6/13-last] SEP 2023-PET scan shows progressive bone lesions;  Currently on left hip RT x10 fractions [ start 9/20]- currently on hydrocodone 1 pill prn; on Fentanyl patch 25 mcg every 3 days.   # Electrolyte  abnormalities: HYPO Kalemia potassium 3.2 continue K-Dur 20 mg twice a day recommend inreasing fluid intake; hypomagnesemia-magnesium today 1.7 normal.  # PN-1-2 sec to taxol-on Neurontin 200 qhs-STABLE.   # DM/poorly controlled- continue metformin 2000 mg q AM [Dr.Tullo]; on glipizide 10 mg XL. Continue follow up with  PCP.  On CGM.- FBG- 144-STABLE   # Iron def anemia-[Iron studies- July 2023] continue Venofer x4; Hb 9.2; monitof or now; will repeat labs  # Mediport: Functioning.   Next zometa on 10/04  # DISPOSITION: # saci today- # follow up in 2 weeks- MD: labs cbc/cmp;iron studies; ferritin; magnesium - sacituzumab; Zometa  # in 3 weeks- labs- cbc/cmp; magnesium sacituzumab- Dr.B         All questions were answered. The patient/family knows to call the clinic with any problems, questions or concerns.   Cammie Sickle, MD 12/20/2021 9:53 AM

## 2021-12-20 NOTE — Assessment & Plan Note (Addendum)
#  Recurrent right breast cancer-s/p breast skin biopsy [FEB 2023- ER positive /PR HER2-0; however 2017-ER-NEGATIVE; -HER2 NEU-1+/LOW]; - recurrent- stage IV; NGS -FEB 2023- CPS =15%. Currently on Keytruda- taxol [2/13]; BONE SCAN- November 24, 2021- Scattered sites of abnormal osseous tracer uptake  consistent with osseous metastases, including BILATERAL femora.   12/02/2021- PET SCAN-  Three hypermetabolic right breast nodules with a right supraclavicular nodal metastasis and diffuse osseous metastatic disease.  Subpleural nodular consolidation in the apical left upper lobe is new from 10/21/2021 and may be infectious/inflammatory in etiology or related to evolving radiation therapy.   #Patient currently on Sacituzumab govitecan-status post cycle 1 day 1.  Proceed with cycle number 2-day 8 today.  Patient tolerating chemotherapy with mild to moderate difficulty...  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   # T2 thoracic metastatic disease [confirmed MRI Jan 2023]-currently s/p Radiation [ 6/13-last] SEP 2023-PET scan shows progressive bone lesions;  Currently on left hip RT x10 fractions [ start 9/20]- currently on hydrocodone 1 pill prn; on Fentanyl patch 25 mcg every 3 days.   # Electrolyte abnormalities: HYPO Kalemia potassium 3.2 continue K-Dur 20 mg twice a day recommend inreasing fluid intake; hypomagnesemia-magnesium today 1.7 normal.  # PN-1-2 sec to taxol-on Neurontin 200 qhs-STABLE.   # DM/poorly controlled- continue metformin 2000 mg q AM [Dr.Tullo]; on glipizide 10 mg XL. Continue follow up with  PCP.  On CGM.- FBG- 144-STABLE   # Iron def anemia-[Iron studies- July 2023] continue Venofer x4; Hb 9.2; monitof or now; will repeat labs  # Mediport: Functioning.   Next zometa on 10/04  # DISPOSITION: # saci today- # follow up in 2 weeks- MD: labs cbc/cmp;iron studies; ferritin; magnesium - sacituzumab; Zometa   # in 3 weeks- labs- cbc/cmp; magnesium sacituzumab- Dr.B

## 2021-12-21 ENCOUNTER — Other Ambulatory Visit: Payer: Self-pay

## 2021-12-21 ENCOUNTER — Ambulatory Visit
Admission: RE | Admit: 2021-12-21 | Discharge: 2021-12-21 | Disposition: A | Discharge status: Home / Self Care | Payer: PPO | Source: Ambulatory Visit | Attending: Radiation Oncology | Admitting: Radiation Oncology

## 2021-12-21 DIAGNOSIS — Z5112 Encounter for antineoplastic immunotherapy: Secondary | ICD-10-CM | POA: Diagnosis not present

## 2021-12-21 LAB — RAD ONC ARIA SESSION SUMMARY
Course Elapsed Days: 7
Plan Fractions Treated to Date: 6
Plan Prescribed Dose Per Fraction: 3 Gy
Plan Total Fractions Prescribed: 10
Plan Total Prescribed Dose: 30 Gy
Reference Point Dosage Given to Date: 18 Gy
Reference Point Session Dosage Given: 3 Gy
Session Number: 6

## 2021-12-22 ENCOUNTER — Ambulatory Visit
Admission: RE | Admit: 2021-12-22 | Discharge: 2021-12-22 | Disposition: A | Discharge status: Home / Self Care | Payer: PPO | Source: Ambulatory Visit | Attending: Radiation Oncology | Admitting: Radiation Oncology

## 2021-12-22 ENCOUNTER — Other Ambulatory Visit: Payer: Self-pay

## 2021-12-22 DIAGNOSIS — Z5112 Encounter for antineoplastic immunotherapy: Secondary | ICD-10-CM | POA: Diagnosis not present

## 2021-12-22 LAB — RAD ONC ARIA SESSION SUMMARY
Course Elapsed Days: 8
Plan Fractions Treated to Date: 7
Plan Prescribed Dose Per Fraction: 3 Gy
Plan Total Fractions Prescribed: 10
Plan Total Prescribed Dose: 30 Gy
Reference Point Dosage Given to Date: 21 Gy
Reference Point Session Dosage Given: 3 Gy
Session Number: 7

## 2021-12-23 ENCOUNTER — Other Ambulatory Visit: Payer: Self-pay

## 2021-12-23 ENCOUNTER — Ambulatory Visit
Admission: RE | Admit: 2021-12-23 | Discharge: 2021-12-23 | Disposition: A | Discharge status: Home / Self Care | Payer: PPO | Source: Ambulatory Visit | Attending: Radiation Oncology | Admitting: Radiation Oncology

## 2021-12-23 DIAGNOSIS — Z5112 Encounter for antineoplastic immunotherapy: Secondary | ICD-10-CM | POA: Diagnosis not present

## 2021-12-23 LAB — RAD ONC ARIA SESSION SUMMARY
Course Elapsed Days: 9
Plan Fractions Treated to Date: 8
Plan Prescribed Dose Per Fraction: 3 Gy
Plan Total Fractions Prescribed: 10
Plan Total Prescribed Dose: 30 Gy
Reference Point Dosage Given to Date: 24 Gy
Reference Point Session Dosage Given: 3 Gy
Session Number: 8

## 2021-12-26 ENCOUNTER — Ambulatory Visit
Admission: RE | Admit: 2021-12-26 | Discharge: 2021-12-26 | Disposition: A | Discharge status: Home / Self Care | Payer: PPO | Source: Ambulatory Visit | Attending: Radiation Oncology | Admitting: Radiation Oncology

## 2021-12-26 ENCOUNTER — Other Ambulatory Visit: Payer: Self-pay

## 2021-12-26 DIAGNOSIS — C7951 Secondary malignant neoplasm of bone: Secondary | ICD-10-CM | POA: Insufficient documentation

## 2021-12-26 DIAGNOSIS — C50919 Malignant neoplasm of unspecified site of unspecified female breast: Secondary | ICD-10-CM | POA: Diagnosis not present

## 2021-12-26 LAB — RAD ONC ARIA SESSION SUMMARY
Course Elapsed Days: 12
Plan Fractions Treated to Date: 9
Plan Prescribed Dose Per Fraction: 3 Gy
Plan Total Fractions Prescribed: 10
Plan Total Prescribed Dose: 30 Gy
Reference Point Dosage Given to Date: 27 Gy
Reference Point Session Dosage Given: 3 Gy
Session Number: 9

## 2021-12-27 ENCOUNTER — Other Ambulatory Visit: Payer: Self-pay

## 2021-12-27 ENCOUNTER — Ambulatory Visit
Admission: RE | Admit: 2021-12-27 | Discharge: 2021-12-27 | Disposition: A | Discharge status: Home / Self Care | Payer: PPO | Source: Ambulatory Visit | Attending: Radiation Oncology | Admitting: Radiation Oncology

## 2021-12-27 DIAGNOSIS — Z510 Encounter for antineoplastic radiation therapy: Secondary | ICD-10-CM | POA: Diagnosis not present

## 2021-12-27 DIAGNOSIS — C50911 Malignant neoplasm of unspecified site of right female breast: Secondary | ICD-10-CM | POA: Diagnosis not present

## 2021-12-27 DIAGNOSIS — C50919 Malignant neoplasm of unspecified site of unspecified female breast: Secondary | ICD-10-CM | POA: Diagnosis not present

## 2021-12-27 DIAGNOSIS — Z171 Estrogen receptor negative status [ER-]: Secondary | ICD-10-CM | POA: Diagnosis not present

## 2021-12-27 DIAGNOSIS — C7951 Secondary malignant neoplasm of bone: Secondary | ICD-10-CM | POA: Diagnosis not present

## 2021-12-27 LAB — RAD ONC ARIA SESSION SUMMARY
Course Elapsed Days: 13
Plan Fractions Treated to Date: 10
Plan Prescribed Dose Per Fraction: 3 Gy
Plan Total Fractions Prescribed: 10
Plan Total Prescribed Dose: 30 Gy
Reference Point Dosage Given to Date: 30 Gy
Reference Point Session Dosage Given: 3 Gy
Session Number: 10

## 2022-01-02 ENCOUNTER — Telehealth: Payer: Self-pay | Admitting: Pharmacy Technician

## 2022-01-02 DIAGNOSIS — Z596 Low income: Secondary | ICD-10-CM

## 2022-01-02 MED FILL — Dexamethasone Sodium Phosphate Inj 100 MG/10ML: INTRAMUSCULAR | Qty: 1 | Status: AC

## 2022-01-02 MED FILL — Fosaprepitant Dimeglumine For IV Infusion 150 MG (Base Eq): INTRAVENOUS | Qty: 5 | Status: AC

## 2022-01-02 NOTE — Progress Notes (Signed)
[facility])                                            [place] Team    01/02/2022  Ashlee Cox 12/03/1950 [id number]                                      Medication Assistance Referral  Referral From: [place]   Medication/Company: Larna Daughters / [employer] Patient application portion:  Education officer, museum portion: Faxed  to Dr. Deborra Medina Provider address/fax verified via: Office website   [name] [name], [place]  [phone]

## 2022-01-03 ENCOUNTER — Inpatient Hospital Stay: Payer: PPO | Attending: Internal Medicine | Admitting: Internal Medicine

## 2022-01-03 ENCOUNTER — Inpatient Hospital Stay: Payer: PPO

## 2022-01-03 ENCOUNTER — Encounter: Payer: Self-pay | Admitting: Internal Medicine

## 2022-01-03 VITALS — BP 115/66 | HR 84 | Temp 99.0°F | Resp 16 | Ht 67.0 in | Wt 181.6 lb

## 2022-01-03 DIAGNOSIS — Z5112 Encounter for antineoplastic immunotherapy: Secondary | ICD-10-CM | POA: Diagnosis not present

## 2022-01-03 DIAGNOSIS — C770 Secondary and unspecified malignant neoplasm of lymph nodes of head, face and neck: Secondary | ICD-10-CM | POA: Diagnosis not present

## 2022-01-03 DIAGNOSIS — C50211 Malignant neoplasm of upper-inner quadrant of right female breast: Secondary | ICD-10-CM | POA: Diagnosis not present

## 2022-01-03 DIAGNOSIS — D509 Iron deficiency anemia, unspecified: Secondary | ICD-10-CM | POA: Insufficient documentation

## 2022-01-03 DIAGNOSIS — E1165 Type 2 diabetes mellitus with hyperglycemia: Secondary | ICD-10-CM | POA: Insufficient documentation

## 2022-01-03 DIAGNOSIS — R197 Diarrhea, unspecified: Secondary | ICD-10-CM | POA: Insufficient documentation

## 2022-01-03 DIAGNOSIS — D4861 Neoplasm of uncertain behavior of right breast: Secondary | ICD-10-CM | POA: Diagnosis not present

## 2022-01-03 DIAGNOSIS — C7951 Secondary malignant neoplasm of bone: Secondary | ICD-10-CM | POA: Diagnosis not present

## 2022-01-03 DIAGNOSIS — Z171 Estrogen receptor negative status [ER-]: Secondary | ICD-10-CM | POA: Insufficient documentation

## 2022-01-03 DIAGNOSIS — D6481 Anemia due to antineoplastic chemotherapy: Secondary | ICD-10-CM

## 2022-01-03 DIAGNOSIS — E878 Other disorders of electrolyte and fluid balance, not elsewhere classified: Secondary | ICD-10-CM | POA: Insufficient documentation

## 2022-01-03 DIAGNOSIS — T451X5A Adverse effect of antineoplastic and immunosuppressive drugs, initial encounter: Secondary | ICD-10-CM

## 2022-01-03 DIAGNOSIS — G620 Drug-induced polyneuropathy: Secondary | ICD-10-CM | POA: Diagnosis not present

## 2022-01-03 DIAGNOSIS — E039 Hypothyroidism, unspecified: Secondary | ICD-10-CM | POA: Diagnosis not present

## 2022-01-03 LAB — COMPREHENSIVE METABOLIC PANEL
ALT: 16 U/L (ref 0–44)
AST: 55 U/L — ABNORMAL HIGH (ref 15–41)
Albumin: 3.1 g/dL — ABNORMAL LOW (ref 3.5–5.0)
Alkaline Phosphatase: 178 U/L — ABNORMAL HIGH (ref 38–126)
Anion gap: 10 (ref 5–15)
BUN: 8 mg/dL (ref 8–23)
CO2: 25 mmol/L (ref 22–32)
Calcium: 8.7 mg/dL — ABNORMAL LOW (ref 8.9–10.3)
Chloride: 102 mmol/L (ref 98–111)
Creatinine, Ser: 0.65 mg/dL (ref 0.44–1.00)
GFR, Estimated: >60 mL/min (ref >60)
Glucose, Bld: 155 mg/dL — ABNORMAL HIGH (ref 70–99)
Potassium: 3.9 mmol/L (ref 3.5–5.1)
Sodium: 137 mmol/L (ref 135–145)
Total Bilirubin: 0.5 mg/dL (ref 0.3–1.2)
Total Protein: 6.8 g/dL (ref 6.5–8.1)

## 2022-01-03 LAB — IRON AND TIBC
Iron: 51 ug/dL (ref 28–170)
Saturation Ratios: 14 % (ref 10.4–31.8)
TIBC: 367 ug/dL (ref 250–450)
UIBC: 316 ug/dL

## 2022-01-03 LAB — CBC WITH DIFFERENTIAL/PLATELET
Abs Immature Granulocytes: 0.04 10*3/uL (ref 0.00–0.07)
Basophils Absolute: 0 10*3/uL (ref 0.0–0.1)
Basophils Relative: 1 %
Eosinophils Absolute: 0.2 10*3/uL (ref 0.0–0.5)
Eosinophils Relative: 4 %
HCT: 28 % — ABNORMAL LOW (ref 36.0–46.0)
Hemoglobin: 8.8 g/dL — ABNORMAL LOW (ref 12.0–15.0)
Immature Granulocytes: 1 %
Lymphocytes Relative: 24 %
Lymphs Abs: 0.8 10*3/uL (ref 0.7–4.0)
MCH: 26.8 pg (ref 26.0–34.0)
MCHC: 31.4 g/dL (ref 30.0–36.0)
MCV: 85.4 fL (ref 80.0–100.0)
Monocytes Absolute: 0.4 10*3/uL (ref 0.1–1.0)
Monocytes Relative: 13 %
Neutro Abs: 2 10*3/uL (ref 1.7–7.7)
Neutrophils Relative %: 57 %
Platelets: 182 10*3/uL (ref 150–400)
RBC: 3.28 MIL/uL — ABNORMAL LOW (ref 3.87–5.11)
RDW: 23.7 % — ABNORMAL HIGH (ref 11.5–15.5)
WBC: 3.4 10*3/uL — ABNORMAL LOW (ref 4.0–10.5)
nRBC: 0 % (ref 0.0–0.2)

## 2022-01-03 LAB — FERRITIN: Ferritin: 581 ng/mL — ABNORMAL HIGH (ref 11–307)

## 2022-01-03 LAB — MAGNESIUM: Magnesium: 1.5 mg/dL — ABNORMAL LOW (ref 1.7–2.4)

## 2022-01-03 LAB — PHOSPHORUS: Phosphorus: 3.6 mg/dL (ref 2.5–4.6)

## 2022-01-03 MED ORDER — SODIUM CHLORIDE 0.9 % IV SOLN
150.0000 mg | Freq: Once | INTRAVENOUS | Status: AC
  Administered 2022-01-03: 150 mg via INTRAVENOUS
  Filled 2022-01-03: qty 150

## 2022-01-03 MED ORDER — SLOW MAGNESIUM/CALCIUM 70-117 MG PO TBEC: 1.0000 | DELAYED_RELEASE_TABLET | Freq: Two times a day (BID) | ORAL | 6 refills | Status: AC

## 2022-01-03 MED ORDER — HEPARIN SOD (PORK) LOCK FLUSH 100 UNIT/ML IV SOLN
500.0000 [IU] | Freq: Once | INTRAVENOUS | Status: AC | PRN
  Administered 2022-01-03: 500 [IU]
  Filled 2022-01-03: qty 5

## 2022-01-03 MED ORDER — SODIUM CHLORIDE 0.9 % IV SOLN
10.0000 mg | Freq: Once | INTRAVENOUS | Status: AC
  Administered 2022-01-03: 10 mg via INTRAVENOUS
  Filled 2022-01-03: qty 10

## 2022-01-03 MED ORDER — DIPHENHYDRAMINE HCL 50 MG/ML IJ SOLN
50.0000 mg | Freq: Once | INTRAMUSCULAR | Status: AC
  Administered 2022-01-03: 50 mg via INTRAVENOUS
  Filled 2022-01-03: qty 1

## 2022-01-03 MED ORDER — FAMOTIDINE IN NACL 20-0.9 MG/50ML-% IV SOLN
20.0000 mg | Freq: Once | INTRAVENOUS | Status: AC
  Administered 2022-01-03: 20 mg via INTRAVENOUS
  Filled 2022-01-03: qty 50

## 2022-01-03 MED ORDER — ACETAMINOPHEN 325 MG PO TABS
650.0000 mg | ORAL_TABLET | Freq: Once | ORAL | Status: AC
  Administered 2022-01-03: 650 mg via ORAL
  Filled 2022-01-03: qty 2

## 2022-01-03 MED ORDER — SODIUM CHLORIDE 0.9 % IV SOLN
Freq: Once | INTRAVENOUS | Status: AC
  Filled 2022-01-03: qty 250

## 2022-01-03 MED ORDER — PALONOSETRON HCL INJECTION 0.25 MG/5ML
0.2500 mg | Freq: Once | INTRAVENOUS | Status: AC
  Administered 2022-01-03: 0.25 mg via INTRAVENOUS
  Filled 2022-01-03: qty 5

## 2022-01-03 MED ORDER — FENTANYL 25 MCG/HR TD PT72: 1.0000 | MEDICATED_PATCH | TRANSDERMAL | 0 refills | Status: DC

## 2022-01-03 MED ORDER — SODIUM CHLORIDE 0.9 % IV SOLN
10.0000 mg/kg | Freq: Once | INTRAVENOUS | Status: AC
  Administered 2022-01-03: 850 mg via INTRAVENOUS
  Filled 2022-01-03: qty 85

## 2022-01-03 MED ORDER — ATROPINE SULFATE 1 MG/ML IV SOLN
0.5000 mg | Freq: Once | INTRAVENOUS | Status: AC | PRN
  Administered 2022-01-03: 0.5 mg via INTRAVENOUS
  Filled 2022-01-03: qty 1

## 2022-01-03 NOTE — Progress Notes (Signed)
Patient has a change in taste for the past 2 weeks where everything taste like metal.   Diarrhea with 3-4 loose stools in the morning and taking Lomotil on worse days.  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  Fentanyl refill pended for MD review.

## 2022-01-03 NOTE — Patient Instructions (Signed)
[facility] [place]  Discharge Instructions: Thank you for choosing [facility] to provide your oncology and hematology care.  If you have a lab appointment with the Cancer Center, please go directly to the Cancer Center and check in at the registration area.  Wear comfortable clothing and clothing appropriate for easy access to any Portacath or PICC line.   We strive to give you quality time with your provider. You may need to reschedule your appointment if you arrive late (15 or more minutes).  Arriving late affects you and other patients whose appointments are after yours.  Also, if you miss three or more appointments without notifying the office, you may be dismissed from the clinic at the provider's discretion.      For prescription refill requests, have your pharmacy contact our office and allow 72 hours for refills to be completed.    Today you received the following chemotherapy and/or immunotherapy agents- Trodelvy      To help prevent nausea and vomiting after your treatment, we encourage you to take your nausea medication as directed.  BELOW ARE SYMPTOMS THAT SHOULD BE REPORTED IMMEDIATELY: *FEVER GREATER THAN 100.4 F (38 C) OR HIGHER *CHILLS OR SWEATING *NAUSEA AND VOMITING THAT IS NOT CONTROLLED WITH YOUR NAUSEA MEDICATION *UNUSUAL SHORTNESS OF BREATH *UNUSUAL BRUISING OR BLEEDING *URINARY PROBLEMS (pain or burning when urinating, or frequent urination) *BOWEL PROBLEMS (unusual diarrhea, constipation, pain near the anus) TENDERNESS IN MOUTH AND THROAT WITH OR WITHOUT PRESENCE OF ULCERS (sore throat, sores in mouth, or a toothache) UNUSUAL RASH, SWELLING OR PAIN  UNUSUAL VAGINAL DISCHARGE OR ITCHING   Items with * indicate a potential emergency and should be followed up as soon as possible or go to the Emergency Department if any problems should occur.  Please show the CHEMOTHERAPY ALERT CARD or IMMUNOTHERAPY ALERT CARD at check-in to  the Emergency Department and triage nurse.  Should you have questions after your visit or need to cancel or reschedule your appointment, please contact [facility]  [phone] and follow the prompts.  Office hours are 8:00 a.m. to 4:30 p.m. Monday - Friday. Please note that voicemails left after 4:00 p.m. may not be returned until the following business day.  We are closed weekends and major holidays. You have access to a nurse at all times for urgent questions. Please call the main number to the clinic [phone] and follow the prompts.  For any non-urgent questions, you may also contact your provider using MyChart. We now offer e-Visits for anyone 18 and older to request care online for non-urgent symptoms. For details visit [email or web address].   Also download the MyChart app! Go to the app store, search "MyChart", open the app, select [facility], and log in with your MyChart username and password.  Masks are optional in the cancer centers. If you would like for your care team to wear a mask while they are taking care of you, please let them know. For doctor visits, patients may have with them one support person who is at least 69 years old. At this time, visitors are not allowed in the infusion area.   

## 2022-01-03 NOTE — Progress Notes (Signed)
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 Byrnett.] [id number] Carbo-taxol-AC;  JAN 2022-s/p Lumpectomy & ALND- [ypT1a (65m ) ypN1 (2/11 LN-positive)]; s/p radiation end of April 2022.  #Early June 2022-Xeloda 1000 mg per metered square [2000] 2 weeks on 1 week of 6-8 cycles; July 11th 2022- STARTING cycle #3-cut down the dose to 1500 mg BID[sec to HFS].   # JAN 2023- A. SKIN, RIGHT BREAST; PUNCH BIOPSY: [Dr.Byrnett] - INVASIVE CARCINOMA WITH DERMAL INFILTRATION AND LYMPHATIC INVASION. ER- weak POSISTIVE [10%]; PR/her2 NEG [[facility] 0]; JAN 20th, 2023- IMPRESSION: 1. Although the hypermetabolic right axillary node has resolved since the prior PET, there is a new hypermetabolic right low cervical/supraclavicular node, suspicious for nodal metastasis. 2. Decrease in right breast hypermetabolism with nonspecific overlying skin thickening in the setting of prior lumpectomy. 3. Low-level hypermetabolism within the T2 vertebral body, suspicious for isolated osseous metastasis. This could be confirmed with pre and postcontrast thoracic spine MRI. 4. Hypermetabolic thoracic nodes, slightly progressive. Given progression, nodal metastasis are slightly favored over reactive etiologies. 5. Incidental findings, including: Aortic Atherosclerosis (ICD10-I70.0). Tiny hiatal hernia. Hepatic steatosis with nonspecific caudate lobe enlargemen  Comment:  The carcinoma is  morphologically similar to the post-neoadjuvant mammary  carcinoma seen in the January 2022 right breast wide excision and lymph  Nodes  # FEB 6th-Taxol single agent #1; FEB 13th, 2023- [CPS =15]; Taxol-Keytruda  MAY 2023- CT CHEST:  Since the PET of 04/15/2021, enlargement of the previously described hypermetabolic right low cervical/supraclavicular nodal metastasis. Foci of hyperenhancement within the right breast are new or more well-defined since 02/02/2021, suspicious for progressive residual/recurrent disease. Nonspecific mediastinal nodes, similar to slightly decreased in size;  New sclerosis at the site of a known T2 vertebral body metastasis.   # s/p palliative radiation therapy to her right supraclavicular nodal metastasis as well as T2 vertebral body [30 Gray in 10 fractions ;[5/31 to  6/13].  CONTINUED TAXOL-KEYTRUDA; SEP 6th 2023-bone scan progressive disease.  Discontinue Taxol-Keytruda  #  SEP 19th 2023- SACITUZUMAB GOVITECAN [at 80% dose]   DIAGNOSIS: Right breast cancer triple negative      Carcinoma of upper-inner quadrant of right breast in female, estrogen receptor negative ([place]  10/07/2019 Initial Diagnosis   Carcinoma of upper-inner quadrant of right breast in female, estrogen receptor negative ([place]   12/04/2019 Genetic Testing   Negative genetic testing. No pathogenic variants identified on the [facility] Common Hereditary Cancers Panel + Skin Cancers Panel. The report date is 12/04/2019.  The Common Hereditary Cancers Panel + Skin Cancers Panel offered by [facility] includes sequencing and/or deletion duplication testing of the following 54 genes: APC*, ATM*, AXIN2, BAP1, BARD1, BMPR1A, BRCA1, BRCA2, BRIP1, CDH1, CDK4, CDKN2A (p14ARF), CDKN2A (p16INK4a), CHEK2, CTNNA1, DICER1*, EPCAM*, GREM1*, HOXB13, KIT, MEN1*, MITF*, MLH1*, MSH2*, MSH3*, MSH6*, MUTYH, NBN, NF1*, NTHL1, PALB2, PDGFRA, PMS2*, POLD1*, POLE, POT1, PTCH1, PTEN*, RAD50, RAD51C, RAD51D, RB1*, RNF43, SDHA*, SDHB,  SDHC*, SDHD, SMAD4, SMARCA4, STK11, SUFU, TP53, TSC1*, TSC2, VHL.    05/07/2020 Cancer Staging   Staging form: Breast, AJCC 8th Edition -  Clinical: Stage IIIB (cT2, cN1, cM0, G3, ER-, PR-, HER2-) - Signed by Cammie Sickle, MD on 05/07/2020   04/18/2021 Cancer Staging   Staging form: Breast, AJCC 8th Edition - Pathologic: Stage IV (pT4d, pN3c, cM1, ER+, PR-, HER2-) - Signed by Cammie Sickle, MD on 04/18/2021 Nuclear grade: G3      HISTORY OF PRESENTING ILLNESS: Accompanied by husband.  Walking independently.  Ashlee Cox 71 y.o.  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  Patient has a change in taste for the past 2 weeks where everything taste like metal.  Diarrhea with 3-4 loose stools in the morning and taking Lomotil on worse days. 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  Patient hip pain is improved.  She is walking independently.  She is taking hydrocodone once a day as needed.  She is on the fentanyl patch.  D Complains of mild to moderate fatigue.  Admits to compliance with her Synthroid.  Review of Systems  Constitutional:  Positive for malaise/fatigue. Negative for chills, diaphoresis, fever and weight loss.  HENT:  Negative for nosebleeds and sore throat.   Eyes:  Negative for double vision.  Respiratory:  Negative for cough, hemoptysis, sputum production and wheezing.   Cardiovascular:  Negative for chest pain, palpitations, orthopnea and leg swelling.  Gastrointestinal:  Negative for abdominal pain, blood in stool, constipation and melena.  Genitourinary:  Negative for dysuria, frequency and urgency.  Musculoskeletal:  Positive for back pain and neck pain. Negative for joint pain.  Neurological:  Positive for tingling. Negative for dizziness, focal weakness, weakness and headaches.  Endo/Heme/Allergies:  Does not  bruise/bleed easily.  Psychiatric/Behavioral:  Negative for depression. The patient is not nervous/anxious and does not have insomnia.      MEDICAL HISTORY:  Past Medical History:  Diagnosis Date   Cancer [facility]) 2021   right breast   Cyst, breast    benign   Diabetes mellitus without complication ([place])    Family history of melanoma    Family history of ovarian cancer    GERD (gastroesophageal reflux disease)    H/O: rheumatic fever    History of colonoscopy 2010   done bc of bleeding,  normal , due back in 5 yrs ([facility])   Hyperlipidemia    Hypertension    Menopause    at age 48   Personal history of chemotherapy 09/2019   RIGHT  INVASIVE MAMMARY CARCINOMA   Personal history of radiation therapy     SURGICAL HISTORY: Past Surgical History:  Procedure Laterality Date   APPENDECTOMY  2006   BREAST BIOPSY Right 09/22/2019   INVASIVE MAMMARY CARCINOMA   BREAST CYST ASPIRATION Left 1999   BREAST LUMPECTOMY Left 04/23/2020   surgery with NL and SN    BREAST LUMPECTOMY WITH NEEDLE LOCALIZATION Right 04/23/2020   Procedure: BREAST LUMPECTOMY WITH NEEDLE LOCALIZATION;  Surgeon: Robert Bellow, MD;  Location: [facility] ORS;  Service: General;  Laterality: Right;   BREAST LUMPECTOMY WITH SENTINEL LYMPH NODE BIOPSY Right 04/23/2020   Procedure: BREAST LUMPECTOMY WITH SENTINEL LYMPH NODE BX;  Surgeon: Robert Bellow, MD;  Location: [facility] ORS;  Service: General;  Laterality: Right;   CHOLECYSTECTOMY  2006   COLONOSCOPY     PORTACATH PLACEMENT Left 10/06/2019   Procedure: INSERTION PORT-A-CATH;  Surgeon: Robert Bellow, MD;  Location: [facility] ORS;  Service: General;  Laterality: Left;   SUBMUCOSAL  TATTOO INJECTION Right 10/06/2019   Procedure: Right axillary TATTOO INJECTION;  Surgeon: Robert Bellow, MD;  Location: [facility] ORS;  Service: General;  Laterality: Right;   VAGINAL DELIVERY     x3    SOCIAL HISTORY: Social History   Socioeconomic History   Marital status: Married     Spouse name: Chrissie Noa   Number of children: 3   Years of education: Not on file   Highest education level: Not on file  Occupational History   Occupation: Glass blower/designer: [employer]  Tobacco Use   Smoking status: Former    Years: 1.00    Types: Cigarettes    Quit date: 12/26/2005    Years since quitting: 16.0   Smokeless tobacco: Never   Tobacco comments:    smoked for less than 1 years,  1 cig/day  Vaping Use   Vaping Use: Never used  Substance and Sexual Activity   Alcohol use: No   Drug use: No   Sexual activity: Never    Partners: Female  Other Topics Concern   Not on file  Social History Narrative   Widowed, March 2014; remarried.      [employer]; quit smoking 1ppw; no alcohol.       Social Determinants of Health   Financial Resource Strain: Medium Risk (05/25/2021)   Overall Financial Resource Strain (CARDIA)    Difficulty of Paying Living Expenses: Somewhat hard  Food Insecurity: No Food Insecurity (05/18/2021)   Hunger Vital Sign    Worried About Running Out of Food in the Last Year: Never true    Ran Out of Food in the Last Year: Never true  Transportation Needs: No Transportation Needs (05/18/2021)   PRAPARE - Hydrologist (Medical): No    Lack of Transportation (Non-Medical): No  Physical Activity: Inactive (05/18/2021)   Exercise Vital Sign    Days of Exercise per Week: 0 days    Minutes of Exercise per Session: 0 min  Stress: No Stress Concern Present (05/18/2021)   [place]    Feeling of Stress : Not at all  Social Connections: [place] (05/18/2021)   Social Connection and Isolation Panel [NHANES]    Frequency of Communication with Friends and Family: More than three times a week    Frequency of Social Gatherings with Friends and Family: More than three times a week    Attends Religious Services: More than 4 times per year    Active Member of  [employer] or Organizations: Yes    Attends Music therapist: More than 4 times per year    Marital Status: Married  Human resources officer Violence: Not At Risk (05/04/2021)   Humiliation, Afraid, Rape, and Kick questionnaire    Fear of Current or Ex-Partner: No    Emotionally Abused: No    Physically Abused: No    Sexually Abused: No    FAMILY HISTORY: Family History  Problem Relation Age of Onset   Cancer Mother 50       ovarian- died 4-5 years.    Heart disease Mother 74   Diabetes Mother    Stroke Father 41   Diabetes Father    Diabetes Sister    Melanoma Sister        survived   Breast cancer Neg Hx     ALLERGIES:  has No Known Allergies.  MEDICATIONS:  Current Outpatient Medications  Medication Sig Dispense Refill  acetaminophen (TYLENOL) 500 MG tablet Take 500 mg by mouth every 8 (eight) hours as needed for moderate pain.     calcium carbonate (OS-CAL) 600 MG TABS tablet Take 600 mg by mouth daily.     Continuous Blood Gluc Sensor (FREESTYLE LIBRE 3 SENSOR) MISC Apply 1 each topically every 14 (fourteen) days. Place 1 sensor on the skin every 14 days. Use to check glucose continuously 6 each 3   diphenoxylate-atropine (LOMOTIL) 2.5-0.025 MG tablet Take 1 tablet by mouth 4 (four) times daily as needed for diarrhea or loose stools. Take it along with immodium 60 tablet 0   gabapentin (NEURONTIN) 100 MG capsule TAKE 2 CAPSULES BY MOUTH AT BEDTIME 180 capsule 0   glipiZIDE (GLUCOTROL XL) 10 MG 24 hr tablet Take 1 tablet by mouth once daily with breakfast 90 tablet 0   HYDROcodone-acetaminophen (NORCO/VICODIN) 5-325 MG tablet Take 1 tablet by mouth every 6 (six) hours as needed for moderate pain. 90 tablet 0   KLOR-CON M20 20 MEQ tablet Take 1 tablet by mouth twice daily 60 tablet 0   levothyroxine (SYNTHROID) 100 MCG tablet TAKE 1 TABLET BY MOUTH ONCE DAILY IN THE MORNING BEFORE BREAKFAST 90 tablet 0   lidocaine-prilocaine (EMLA) cream Apply 1 application topically as  needed. Apply to port and cover with saran wrap 1-2 hours prior to port access 30 g 1   losartan-hydrochlorothiazide (HYZAAR) 50-12.5 MG tablet Take 1 tablet by mouth once daily 90 tablet 0   lovastatin (MEVACOR) 40 MG tablet TAKE 1 TABLET BY MOUTH AT BEDTIME 90 tablet 1   Magnesium Cl-Calcium Carbonate (SLOW MAGNESIUM/CALCIUM) 70-117 MG TBEC Take 1 tablet by mouth 2 (two) times daily. 60 tablet 6   metFORMIN (GLUCOPHAGE) 1000 MG tablet TAKE 2 TABLETS BY MOUTH ONCE DAILY WITH BREAKFAST 180 tablet 0   Multiple Vitamin (MULTIVITAMIN) tablet Take 1 tablet by mouth daily.     omeprazole (PRILOSEC) 20 MG capsule Take 1 capsule by mouth once daily 90 capsule 0   ondansetron (ZOFRAN) 8 MG tablet One pill every 8 hours as needed for nausea/vomitting. 40 tablet 1   prochlorperazine (COMPAZINE) 10 MG tablet Take 1 tablet (10 mg total) by mouth every 6 (six) hours as needed for nausea or vomiting. 40 tablet 1   Semaglutide,0.25 or 0.5MG/DOS, (OZEMPIC, 0.25 OR 0.5 MG/DOSE,) 2 MG/1.5ML SOPN Inject 0.5 mg into the skin once a week. 1.5 mL 11   vitamin B-12 (CYANOCOBALAMIN) 100 MCG tablet Take 100 mcg by mouth daily.     fentaNYL (DURAGESIC) 25 MCG/HR Place 1 patch onto the skin every 3 (three) days. 10 patch 0   No current facility-administered medications for this visit.   Facility-Administered Medications Ordered in Other Visits  Medication Dose Route Frequency Provider Last Rate Last Admin   acetaminophen (TYLENOL) tablet 650 mg  650 mg Oral Once Charlaine Dalton R, MD       atropine injection 0.5 mg  0.5 mg Intravenous Once PRN Cammie Sickle, MD       dexamethasone (DECADRON) 10 mg in sodium chloride 0.9 % 50 mL IVPB  10 mg Intravenous Once Cammie Sickle, MD       diphenhydrAMINE (BENADRYL) injection 50 mg  50 mg Intravenous Once Cammie Sickle, MD       famotidine (PEPCID) IVPB 20 mg premix  20 mg Intravenous Once Charlaine Dalton R, MD       fosaprepitant (EMEND) 150 mg in  sodium chloride 0.9 % 145 mL IVPB  150 mg Intravenous Once Charlaine Dalton R, MD       heparin lock flush 100 unit/mL  500 Units Intracatheter Once PRN Cammie Sickle, MD       palonosetron (ALOXI) injection 0.25 mg  0.25 mg Intravenous Once Cammie Sickle, MD       sacituzumab govitecan-hziy (TRODELVY) 850 mg in sodium chloride 0.9 % 250 mL (2.5373 mg/mL) chemo infusion  10 mg/kg (Treatment Plan Recorded) Intravenous Once Cammie Sickle, MD          .  PHYSICAL EXAMINATION: ECOG PERFORMANCE STATUS: 0 - Asymptomatic  Vitals:   01/03/22 0900  BP: 115/66  Pulse: 84  Resp: 16  Temp: 99 F (37.2 C)     Filed Weights   01/03/22 0900  Weight: 181 lb 9.6 oz (82.4 kg)     Right supraclavicular lymphadenopathy felt. 2-3 cm in size.   Physical Exam HENT:     Head: Normocephalic and atraumatic.     Mouth/Throat:     Pharynx: No oropharyngeal exudate.  Eyes:     Pupils: Pupils are equal, round, and reactive to light.  Cardiovascular:     Rate and Rhythm: Normal rate and regular rhythm.  Pulmonary:     Effort: Pulmonary effort is normal. No respiratory distress.     Breath sounds: Normal breath sounds. No wheezing.  Abdominal:     General: Bowel sounds are normal. There is no distension.     Palpations: Abdomen is soft. There is no mass.     Tenderness: There is no abdominal tenderness. There is no guarding or rebound.  Musculoskeletal:        General: No tenderness. Normal range of motion.     Cervical back: Normal range of motion and neck supple.  Skin:    General: Skin is warm.  Neurological:     Mental Status: She is alert and oriented to person, place, and time.  Psychiatric:        Mood and Affect: Affect normal.      LABORATORY DATA:  I have reviewed the data as listed Lab Results  Component Value Date   WBC 3.4 (L) 01/03/2022   HGB 8.8 (L) 01/03/2022   HCT 28.0 (L) 01/03/2022   MCV 85.4 01/03/2022   PLT 182 01/03/2022   Recent  Labs    12/13/21 1001 12/20/21 0847 01/03/22 0905  NA 135 135 137  K 4.0 4.0 3.9  CL 100 101 102  CO2 _0 GLUCOSE 145* 202* 155*  BUN _1 CREATININE 0.82 0.85 0.65  CALCIUM 9.3 9.4 8.7*  GFRNONAA >60 >60 >60  PROT 7.7 7.4 6.8  ALBUMIN 3.1* 2.9* 3.1*  AST 39 40 55*  ALT _2 ALKPHOS 199* 191* 178*  BILITOT 0.6 0.1* 0.5    RADIOGRAPHIC STUDIES: I have personally reviewed the radiological images as listed and agreed with the findings in the report. No results found.  ASSESSMENT & PLAN:   Carcinoma of upper-inner quadrant of right breast in female, estrogen receptor negative ([place]) #Recurrent right breast cancer-s/p breast skin biopsy [FEB 2023- ER positive /PR HER2-0; however 2017-ER-NEGATIVE; -HER2 NEU-1+/LOW]; - recurrent- stage IV; NGS -FEB 2023- CPS =15%. Currently on Keytruda- taxol [2/13]; BONE SCAN- November 24, 2021- Scattered sites of abnormal osseous tracer uptake  consistent with osseous metastases, including BILATERAL femora.   12/02/2021- PET SCAN-  Three hypermetabolic right breast nodules with a right supraclavicular nodal metastasis and diffuse osseous metastatic  disease.  Subpleural nodular consolidation in the apical left upper lobe is new from 10/21/2021 and may be infectious/inflammatory in etiology or related to evolving radiation therapy.   #Patient currently on Sacituzumab govitecan-proceed with cycle number 2-day 1 today.  Patient tolerating chemotherapy with mild to moderate difficulty- see below-diarrhea/fatigue/poor taste  #Fatigue multifactorial-anemia hemoglobin 8.8.  Monitor for now.  Discussed possible need for blood transfusion; hold tube.  Also plan IV iron; iron studies pending.  # Diarrhea- G-1- recommend continue lomotil as planned.   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   # T2 thoracic metastatic disease [confirmed MRI Jan 2023]-currently s/p  Radiation [ 6/13-last] SEP 2023-PET scan shows progressive bone lesions;  Currently s/p left hip RT x10 fractions [ start 9/20]- currently on hydrocodone 1 pill prn; on Fentanyl patch 25 mcg every 3 days. STABLE. On  Zometa.   # Electrolyte abnormalities: HYPO Kalemia potassium 3.2 continue K-Dur 20 mg twice a day recommend inreasing fluid intake; hypomagnesemia-magnesium today 1.5- ADD slow mag.   # PN-1-2 sec to taxol-on Neurontin 200 qhs-STABLE.   # DM/poorly controlled- continue metformin 2000 mg q AM [Dr.Tullo]; on glipizide 10 mg XL. Continue follow up with  PCP.  On CGM.- FBG- 144-STABLE   # Iron def anemia-[Iron studies- July 2023] continue Venofer x4; Hb 8.9 ; monitof or now; will repeat labs  # Mediport: Functioning.   Last zometa on 09/06-  # DISPOSITION: # saci today; HOLD Zometa   # next week chemo as planned; ADD venofer; ADD HOLD tube  # in 3 weeks-MD;  labs- cbc/cmp; ADD HOLD tube; magnesium sacituzumab; Zometa;   # in 4 weeks- labs- cbc/cmp; Sacituzumab; venofer--  Dr.B          All questions were answered. The patient/family knows to call the clinic with any problems, questions or concerns.   Cammie Sickle, MD 01/03/2022 10:12 AM

## 2022-01-03 NOTE — Assessment & Plan Note (Addendum)
#  Recurrent right breast cancer-s/p breast skin biopsy [FEB 2023- ER positive /PR HER2-0; however 2017-ER-NEGATIVE; -HER2 NEU-1+/LOW]; - recurrent- stage IV; NGS -FEB 2023- CPS =15%. Currently on Keytruda- taxol [2/13]; BONE SCAN- November 24, 2021- Scattered sites of abnormal osseous tracer uptake  consistent with osseous metastases, including BILATERAL femora.   12/02/2021- PET SCAN-  Three hypermetabolic right breast nodules with a right supraclavicular nodal metastasis and diffuse osseous metastatic disease.  Subpleural nodular consolidation in the apical left upper lobe is new from 10/21/2021 and may be infectious/inflammatory in etiology or related to evolving radiation therapy.   #Patient currently on Sacituzumab govitecan-proceed with cycle number 2-day 1 today.  Patient tolerating chemotherapy with mild to moderate difficulty- see below-diarrhea/fatigue/poor taste  #Fatigue multifactorial-anemia hemoglobin 8.8.  Monitor for now.  Discussed possible need for blood transfusion; hold tube.  Also plan IV iron; iron studies pending.  # Diarrhea- G-1- recommend continue lomotil as planned.   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   # T2 thoracic metastatic disease [confirmed MRI Jan 2023]-currently s/p Radiation [ 6/13-last] SEP 2023-PET scan shows progressive bone lesions;  Currently s/p left hip RT x10 fractions [ start 9/20]- currently on hydrocodone 1 pill prn; on Fentanyl patch 25 mcg every 3 days. STABLE. On  Zometa.   # Electrolyte abnormalities: HYPO Kalemia potassium 3.2 continue K-Dur 20 mg twice a day recommend inreasing fluid intake; hypomagnesemia-magnesium today 1.5- ADD slow mag.   # PN-1-2 sec to taxol-on Neurontin 200 qhs-STABLE.   # DM/poorly controlled- continue metformin 2000 mg q AM [Dr.Tullo]; on glipizide 10 mg XL. Continue follow up with  PCP.  On CGM.- FBG- 144-STABLE   # Iron def anemia-[Iron  studies- July 2023] continue Venofer x4; Hb 8.9 ; monitof or now; will repeat labs  # Mediport: Functioning.   Last zometa on 09/06-  * ADD TSH at next visit- last was in sep # DISPOSITION: # saci today; HOLD Zometa   # next week chemo as planned; ADD venofer; ADD HOLD tube  # in 3 weeks-MD;  labs- cbc/cmp; ADD HOLD tube; magnesium sacituzumab; Zometa;   # in 4 weeks- labs- cbc/cmp; Sacituzumab; venofer--  Dr.B

## 2022-01-06 ENCOUNTER — Ambulatory Visit: Payer: Self-pay

## 2022-01-06 NOTE — Patient Outreach (Signed)
  Care Coordination   Follow Up Visit Note   01/06/2022 Name: Ashlee Cox MRN: [id number] DOB: 1950-05-06  Hervey Ard Bari is a 71 y.o. year old female who sees Derrel Nip, Aris Everts, MD for primary care. I spoke with  [name] by phone today.  What matters to the patients health and wellness today?  Patient states she completed her radiation treatment. Continues with chemotherapy. Patient reports having PET scan and results showed the cancer has spread to her spine and bilateral hips.  Patient states she has a great support system and her husband is helping as she needs it.  Patient reports she is having a little more pain today in her hips. She states she is managing with the hydrocodone and fentanyl patch.      Goals Addressed             This Visit's Progress    Patient Stated: " I want to be cancer free and maintain my quality of life"       Care Coordination Interventions: Evaluation of current treatment plan related to breast cancer and patient's adherence to plan as established by provider Reviewed medications with patient and discussed importance of compliance Reviewed scheduled/upcoming provider appointments  Discussed with patient importance of ongoing proper nutrition while undergoing cancer treatment Advised to continue to remain hydrated Discussed with patient importance of getting proper rest Evaluated patients support system. Discussed and offered social work follow up            SDOH assessments and interventions completed:  No     Care Coordination Interventions Activated:  Yes  Care Coordination Interventions:  Yes, provided   Follow up plan: Follow up call scheduled for 02/13/22 at 2:30 pm    Encounter Outcome:  Pt. Visit Completed   [name] RN,BSN,CCM [place] [phone] direct line

## 2022-01-09 ENCOUNTER — Other Ambulatory Visit: Payer: Self-pay | Admitting: Internal Medicine

## 2022-01-09 MED FILL — Fosaprepitant Dimeglumine For IV Infusion 150 MG (Base Eq): INTRAVENOUS | Qty: 5 | Status: AC

## 2022-01-09 MED FILL — Dexamethasone Sodium Phosphate Inj 100 MG/10ML: INTRAMUSCULAR | Qty: 1 | Status: AC

## 2022-01-10 ENCOUNTER — Inpatient Hospital Stay: Payer: PPO

## 2022-01-10 ENCOUNTER — Inpatient Hospital Stay (HOSPITAL_BASED_OUTPATIENT_CLINIC_OR_DEPARTMENT_OTHER): Payer: PPO | Admitting: Nurse Practitioner

## 2022-01-10 ENCOUNTER — Other Ambulatory Visit: Payer: Self-pay | Admitting: Internal Medicine

## 2022-01-10 ENCOUNTER — Other Ambulatory Visit: Payer: Self-pay | Admitting: Family

## 2022-01-10 VITALS — BP 132/67 | HR 96 | Temp 97.0°F | Resp 17

## 2022-01-10 VITALS — BP 132/67 | HR 96 | Temp 97.0°F | Resp 17 | Wt 174.5 lb

## 2022-01-10 DIAGNOSIS — R109 Unspecified abdominal pain: Secondary | ICD-10-CM | POA: Diagnosis not present

## 2022-01-10 DIAGNOSIS — Z5112 Encounter for antineoplastic immunotherapy: Secondary | ICD-10-CM | POA: Diagnosis not present

## 2022-01-10 DIAGNOSIS — E860 Dehydration: Secondary | ICD-10-CM

## 2022-01-10 DIAGNOSIS — G620 Drug-induced polyneuropathy: Secondary | ICD-10-CM

## 2022-01-10 DIAGNOSIS — T451X5A Adverse effect of antineoplastic and immunosuppressive drugs, initial encounter: Secondary | ICD-10-CM

## 2022-01-10 DIAGNOSIS — D4861 Neoplasm of uncertain behavior of right breast: Secondary | ICD-10-CM

## 2022-01-10 DIAGNOSIS — R531 Weakness: Secondary | ICD-10-CM

## 2022-01-10 DIAGNOSIS — R432 Parageusia: Secondary | ICD-10-CM

## 2022-01-10 DIAGNOSIS — R197 Diarrhea, unspecified: Secondary | ICD-10-CM

## 2022-01-10 DIAGNOSIS — Z171 Estrogen receptor negative status [ER-]: Secondary | ICD-10-CM

## 2022-01-10 LAB — SAMPLE TO BLOOD BANK

## 2022-01-10 LAB — COMPREHENSIVE METABOLIC PANEL
ALT: 18 U/L (ref 0–44)
AST: 47 U/L — ABNORMAL HIGH (ref 15–41)
Albumin: 3.1 g/dL — ABNORMAL LOW (ref 3.5–5.0)
Alkaline Phosphatase: 196 U/L — ABNORMAL HIGH (ref 38–126)
Anion gap: 11 (ref 5–15)
BUN: 11 mg/dL (ref 8–23)
CO2: 23 mmol/L (ref 22–32)
Calcium: 9.2 mg/dL (ref 8.9–10.3)
Chloride: 101 mmol/L (ref 98–111)
Creatinine, Ser: 0.78 mg/dL (ref 0.44–1.00)
GFR, Estimated: >60 mL/min (ref >60)
Glucose, Bld: 170 mg/dL — ABNORMAL HIGH (ref 70–99)
Potassium: 3.6 mmol/L (ref 3.5–5.1)
Sodium: 135 mmol/L (ref 135–145)
Total Bilirubin: 0.6 mg/dL (ref 0.3–1.2)
Total Protein: 7.1 g/dL (ref 6.5–8.1)

## 2022-01-10 LAB — CBC WITH DIFFERENTIAL/PLATELET
Abs Immature Granulocytes: 0.11 10*3/uL — ABNORMAL HIGH (ref 0.00–0.07)
Basophils Absolute: 0 10*3/uL (ref 0.0–0.1)
Basophils Relative: 1 %
Eosinophils Absolute: 0.1 10*3/uL (ref 0.0–0.5)
Eosinophils Relative: 4 %
HCT: 27 % — ABNORMAL LOW (ref 36.0–46.0)
Hemoglobin: 8.7 g/dL — ABNORMAL LOW (ref 12.0–15.0)
Immature Granulocytes: 5 %
Lymphocytes Relative: 33 %
Lymphs Abs: 0.8 10*3/uL (ref 0.7–4.0)
MCH: 26.9 pg (ref 26.0–34.0)
MCHC: 32.2 g/dL (ref 30.0–36.0)
MCV: 83.6 fL (ref 80.0–100.0)
Monocytes Absolute: 0.3 10*3/uL (ref 0.1–1.0)
Monocytes Relative: 13 %
Neutro Abs: 1 10*3/uL — ABNORMAL LOW (ref 1.7–7.7)
Neutrophils Relative %: 44 %
Platelets: 151 10*3/uL (ref 150–400)
RBC: 3.23 MIL/uL — ABNORMAL LOW (ref 3.87–5.11)
RDW: 23.1 % — ABNORMAL HIGH (ref 11.5–15.5)
WBC: 2.3 10*3/uL — ABNORMAL LOW (ref 4.0–10.5)
nRBC: 1.7 % — ABNORMAL HIGH (ref 0.0–0.2)

## 2022-01-10 LAB — C DIFFICILE QUICK SCREEN W PCR REFLEX
C Diff antigen: NEGATIVE
C Diff interpretation: NOT DETECTED
C Diff toxin: NEGATIVE

## 2022-01-10 LAB — MAGNESIUM: Magnesium: 1.7 mg/dL (ref 1.7–2.4)

## 2022-01-10 MED ORDER — DEXAMETHASONE SODIUM PHOSPHATE 10 MG/ML IJ SOLN
5.0000 mg | Freq: Once | INTRAMUSCULAR | Status: AC
  Administered 2022-01-10: 5 mg via INTRAVENOUS
  Filled 2022-01-10: qty 1

## 2022-01-10 MED ORDER — SODIUM CHLORIDE 0.9 % IV SOLN
INTRAVENOUS | Status: DC
  Filled 2022-01-10 (×2): qty 250

## 2022-01-10 MED ORDER — HEPARIN SOD (PORK) LOCK FLUSH 100 UNIT/ML IV SOLN
INTRAVENOUS | Status: AC
  Filled 2022-01-10: qty 5

## 2022-01-10 NOTE — Progress Notes (Signed)
Symptom Management [place] at [place]. [facility] [address] [place], [place] [address] [phone] (phone) [phone] (fax)  Patient Care Team: [name], [name] Noreene Filbert, MD as Referring Physician (Radiation Oncology) Cammie Sickle, MD as Consulting Physician (Internal Medicine) Bary Castilla Forest Gleason, MD as Consulting Physician (General Surgery) Jeral Fruit, RN as Registered Nurse Dannielle Karvonen, RN as [place] [facility]   Name of the patient: Ashlee Cox  [id number]  May 31, 1950   Date of visit: 01/10/22  Diagnosis- Breast Cancer  Chief complaint/ Reason for visit- Abdominal cramping, taste changes, diarrhea  Heme/Onc history:  Oncology History Overview Note  #June 2021- Right breast cancer-T2N1; 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 Byrnett.] [id number] Carbo-taxol-AC;  JAN 2022-s/p Lumpectomy & ALND- [ypT1a (30m ) ypN1 (2/11 LN-positive)]; s/p radiation end of April 2022.  #Early June 2022-Xeloda 1000 mg per metered square [2000] 2 weeks on 1 week of 6-8 cycles; July 11th 2022- STARTING cycle #3-cut down the dose to 1500 mg BID[sec to HFS].   # JAN 2023- A. SKIN, RIGHT BREAST; PUNCH BIOPSY: [Dr.Byrnett] - INVASIVE CARCINOMA WITH DERMAL INFILTRATION AND LYMPHATIC INVASION. ER- weak POSISTIVE [10%]; PR/her2 NEG [[facility] 0]; JAN 20th, 2023- IMPRESSION: 1. Although the hypermetabolic right axillary node has resolved since the prior PET, there is a new hypermetabolic right low cervical/supraclavicular node, suspicious for nodal metastasis. 2. Decrease in right breast hypermetabolism with nonspecific overlying skin thickening in the setting of prior lumpectomy. 3. Low-level hypermetabolism within the T2 vertebral body, suspicious for isolated osseous metastasis. This could be confirmed with pre and  postcontrast thoracic spine MRI. 4. Hypermetabolic thoracic nodes, slightly progressive. Given progression, nodal metastasis are slightly favored over reactive etiologies. 5. Incidental findings, including: Aortic Atherosclerosis (ICD10-I70.0). Tiny hiatal hernia. Hepatic steatosis with nonspecific caudate lobe enlargemen  Comment:  The carcinoma is morphologically similar to the post-neoadjuvant mammary  carcinoma seen in the January 2022 right breast wide excision and lymph  Nodes  # FEB 6th-Taxol single agent #1; FEB 13th, 2023- [CPS =15]; Taxol-Keytruda  MAY 2023- CT CHEST:  Since the PET of 04/15/2021, enlargement of the previously described hypermetabolic right low cervical/supraclavicular nodal metastasis. Foci of hyperenhancement within the right breast are new or more well-defined since 02/02/2021, suspicious for progressive residual/recurrent disease. Nonspecific mediastinal nodes, similar to slightly decreased in size;  New sclerosis at the site of a known T2 vertebral body metastasis.   # s/p palliative radiation therapy to her right supraclavicular nodal metastasis as well as T2 vertebral body [30 Gray in 10 fractions ;[5/31 to  6/13].  CONTINUED TAXOL-KEYTRUDA; SEP 6th 2023-bone scan progressive disease.  Discontinue Taxol-Keytruda  #  SEP 19th 2023- SACITUZUMAB GOVITECAN [at 80% dose]   DIAGNOSIS: Right breast cancer triple negative      Carcinoma of upper-inner quadrant of right breast in female, estrogen receptor negative ([facility]  10/07/2019 Initial Diagnosis   Carcinoma of upper-inner quadrant of right breast in female, estrogen receptor negative ([facility]   12/04/2019 Genetic Testing   Negative genetic testing. No pathogenic variants identified on the [facility] Common Hereditary Cancers Panel + Skin Cancers Panel. The report date is 12/04/2019.  The Common Hereditary Cancers Panel + Skin Cancers Panel offered by [facility] includes sequencing and/or deletion duplication testing  of the following 54 genes: APC*, ATM*, AXIN2, BAP1, BARD1, BMPR1A, BRCA1, BRCA2, BRIP1, CDH1, CDK4, CDKN2A (p14ARF), CDKN2A (p16INK4a), CHEK2, CTNNA1,  DICER1*, EPCAM*, GREM1*, HOXB13, KIT, MEN1*, MITF*, MLH1*, MSH2*, MSH3*, MSH6*, MUTYH, NBN, NF1*, NTHL1, PALB2, PDGFRA, PMS2*, POLD1*, POLE, POT1, PTCH1, PTEN*, RAD50, RAD51C, RAD51D, RB1*, RNF43, SDHA*, SDHB, SDHC*, SDHD, SMAD4, SMARCA4, STK11, SUFU, TP53, TSC1*, TSC2, VHL.    05/07/2020 Cancer Staging   Staging form: Breast, AJCC 8th Edition - Clinical: Stage IIIB (cT2, cN1, cM0, G3, ER-, PR-, HER2-) - Signed by Cammie Sickle, MD on 05/07/2020   04/18/2021 Cancer Staging   Staging form: Breast, AJCC 8th Edition - Pathologic: Stage IV (pT4d, pN3c, cM1, ER+, PR-, HER2-) - Signed by Cammie Sickle, MD on 04/18/2021 Nuclear grade: G3     Interval history- Called to infusion suite where patient is currently receiving infusion. She reported to nursing changes in taste, abdominal cramping, and diarrhea. She has noticed weight loss. Treatment was held today but she is receiving IV iron/venofer. She is accompanied by her husband. Stool is mucous like, watery. Tastes lomotil intermittently.   ECOG FS:2 - Symptomatic, <50% confined to bed  Review of systems- Review of Systems  Constitutional:  Positive for malaise/fatigue and weight loss. Negative for chills and fever.  HENT:  Negative for hearing loss, nosebleeds, sore throat and tinnitus.        Taste changes  Eyes:  Negative for blurred vision and double vision.  Respiratory:  Negative for cough, hemoptysis, shortness of breath and wheezing.   Cardiovascular:  Negative for chest pain, palpitations and leg swelling.  Gastrointestinal:  Positive for abdominal pain and diarrhea. Negative for blood in stool, constipation, melena, nausea and vomiting.  Genitourinary:  Negative for dysuria and urgency.  Musculoskeletal:  Negative for back pain, falls, joint pain and myalgias.  Skin:   Negative for itching and rash.  Neurological:  Negative for dizziness, tingling, sensory change, loss of consciousness, weakness and headaches.  Endo/Heme/Allergies:  Negative for environmental allergies. Does not bruise/bleed easily.  Psychiatric/Behavioral:  Negative for depression. The patient is not nervous/anxious and does not have insomnia.     Current treatment- sacituzumab- s/p cycle 2 on 01/03/22  No Known Allergies  Past Medical History:  Diagnosis Date   Cancer ([place]) 2021   right breast   Cyst, breast    benign   Diabetes mellitus without complication ([place])    Family history of melanoma    Family history of ovarian cancer    GERD (gastroesophageal reflux disease)    H/O: rheumatic fever    History of colonoscopy 2010   done bc of bleeding,  normal , due back in 5 yrs ([place])   Hyperlipidemia    Hypertension    Menopause    at age 31   Personal history of chemotherapy 09/2019   RIGHT  INVASIVE MAMMARY CARCINOMA   Personal history of radiation therapy     Past Surgical History:  Procedure Laterality Date   APPENDECTOMY  2006   BREAST BIOPSY Right 09/22/2019   INVASIVE MAMMARY CARCINOMA   BREAST CYST ASPIRATION Left 1999   BREAST LUMPECTOMY Left 04/23/2020   surgery with NL and SN    BREAST LUMPECTOMY WITH NEEDLE LOCALIZATION Right 04/23/2020   Procedure: BREAST LUMPECTOMY WITH NEEDLE LOCALIZATION;  Surgeon: Robert Bellow, MD;  Location: [facility] ORS;  Service: General;  Laterality: Right;   BREAST LUMPECTOMY WITH SENTINEL LYMPH NODE BIOPSY Right 04/23/2020   Procedure: BREAST LUMPECTOMY WITH SENTINEL LYMPH NODE BX;  Surgeon: Robert Bellow, MD;  Location: [facility] ORS;  Service: General;  Laterality: Right;   CHOLECYSTECTOMY  2006  COLONOSCOPY     PORTACATH PLACEMENT Left 10/06/2019   Procedure: INSERTION PORT-A-CATH;  Surgeon: Robert Bellow, MD;  Location: [facility] ORS;  Service: General;  Laterality: Left;   SUBMUCOSAL TATTOO INJECTION Right 10/06/2019    Procedure: Right axillary TATTOO INJECTION;  Surgeon: Robert Bellow, MD;  Location: [facility] ORS;  Service: General;  Laterality: Right;   VAGINAL DELIVERY     x3    Social History   Socioeconomic History   Marital status: Married    Spouse name: Chrissie Noa   Number of children: 3   Years of education: Not on file   Highest education level: Not on file  Occupational History   Occupation: Glass blower/designer: [employer]  Tobacco Use   Smoking status: Former    Years: 1.00    Types: Cigarettes    Quit date: 12/26/2005    Years since quitting: 16.0   Smokeless tobacco: Never   Tobacco comments:    smoked for less than 1 years,  1 cig/day  Vaping Use   Vaping Use: Never used  Substance and Sexual Activity   Alcohol use: No   Drug use: No   Sexual activity: Never    Partners: Female  Other Topics Concern   Not on file  Social History Narrative   Widowed, March 2014; remarried.      [employer] ret[employer]; quit smoking 1ppw; no alcohol.       Social Determinants of Health   Financial Resource Strain: Medium Risk (05/25/2021)   Overall Financial Resource Strain (CARDIA)    Difficulty of Paying Living Expenses: Somewhat hard  Food Insecurity: No Food Insecurity (05/18/2021)   Hunger Vital Sign    Worried About Running Out of Food in the Last Year: Never true    Ran Out of Food in the Last Year: Never true  Transportation Needs: No Transportation Needs (05/18/2021)   PRAPARE - Hydrologist (Medical): No    Lack of Transportation (Non-Medical): No  Physical Activity: Inactive (05/18/2021)   Exercise Vital Sign    Days of Exercise per Week: 0 days    Minutes of Exercise per Session: 0 min  Stress: No Stress Concern Present (05/18/2021)   [place]    Feeling of Stress : Not at all  Social Connections: [place] (05/18/2021)   Social Connection and Isolation Panel [NHANES]    Frequency  of Communication with Friends and Family: More than three times a week    Frequency of Social Gatherings with Friends and Family: More than three times a week    Attends Religious Services: More than 4 times per year    Active Member of [employer] or Organizations: Yes    Attends Music therapist: More than 4 times per year    Marital Status: Married  Human resources officer Violence: Not At Risk (05/04/2021)   Humiliation, Afraid, Rape, and Kick questionnaire    Fear of Current or Ex-Partner: No    Emotionally Abused: No    Physically Abused: No    Sexually Abused: No    Family History  Problem Relation Age of Onset   Cancer Mother 29       ovarian- died 4-5 years.    Heart disease Mother 53   Diabetes Mother    Stroke Father 50   Diabetes Father    Diabetes Sister    Melanoma Sister        survived  Breast cancer Neg Hx      Current Outpatient Medications:    acetaminophen (TYLENOL) 500 MG tablet, Take 500 mg by mouth every 8 (eight) hours as needed for moderate pain., Disp: , Rfl:    calcium carbonate (OS-CAL) 600 MG TABS tablet, Take 600 mg by mouth daily., Disp: , Rfl:    Continuous Blood Gluc Sensor (FREESTYLE LIBRE 3 SENSOR) MISC, Apply 1 each topically every 14 (fourteen) days. Place 1 sensor on the skin every 14 days. Use to check glucose continuously, Disp: 6 each, Rfl: 3   diphenoxylate-atropine (LOMOTIL) 2.5-0.025 MG tablet, Take 1 tablet by mouth 4 (four) times daily as needed for diarrhea or loose stools. Take it along with immodium, Disp: 60 tablet, Rfl: 0   fentaNYL (DURAGESIC) 25 MCG/HR, Place 1 patch onto the skin every 3 (three) days., Disp: 10 patch, Rfl: 0   gabapentin (NEURONTIN) 100 MG capsule, TAKE 2 CAPSULES BY MOUTH AT BEDTIME, Disp: 180 capsule, Rfl: 0   glipiZIDE (GLUCOTROL XL) 10 MG 24 hr tablet, Take 1 tablet by mouth once daily with breakfast, Disp: 90 tablet, Rfl: 0   HYDROcodone-acetaminophen (NORCO/VICODIN) 5-325 MG tablet, Take 1 tablet by mouth  every 6 (six) hours as needed for moderate pain., Disp: 90 tablet, Rfl: 0   KLOR-CON M20 20 MEQ tablet, Take 1 tablet by mouth twice daily, Disp: 60 tablet, Rfl: 0   levothyroxine (SYNTHROID) 100 MCG tablet, TAKE 1 TABLET BY MOUTH ONCE DAILY IN THE MORNING BEFORE BREAKFAST, Disp: 90 tablet, Rfl: 0   lidocaine-prilocaine (EMLA) cream, Apply 1 application topically as needed. Apply to port and cover with saran wrap 1-2 hours prior to port access, Disp: 30 g, Rfl: 1   losartan-hydrochlorothiazide (HYZAAR) 50-12.5 MG tablet, Take 1 tablet by mouth once daily, Disp: 90 tablet, Rfl: 0   lovastatin (MEVACOR) 40 MG tablet, TAKE 1 TABLET BY MOUTH AT BEDTIME, Disp: 90 tablet, Rfl: 1   Magnesium Cl-Calcium Carbonate (SLOW MAGNESIUM/CALCIUM) 70-117 MG TBEC, Take 1 tablet by mouth 2 (two) times daily., Disp: 60 tablet, Rfl: 6   metFORMIN (GLUCOPHAGE) 1000 MG tablet, TAKE 2 TABLETS BY MOUTH ONCE DAILY WITH BREAKFAST, Disp: 180 tablet, Rfl: 0   Multiple Vitamin (MULTIVITAMIN) tablet, Take 1 tablet by mouth daily., Disp: , Rfl:    omeprazole (PRILOSEC) 20 MG capsule, Take 1 capsule by mouth once daily, Disp: 90 capsule, Rfl: 0   ondansetron (ZOFRAN) 8 MG tablet, One pill every 8 hours as needed for nausea/vomitting., Disp: 40 tablet, Rfl: 1   prochlorperazine (COMPAZINE) 10 MG tablet, Take 1 tablet (10 mg total) by mouth every 6 (six) hours as needed for nausea or vomiting., Disp: 40 tablet, Rfl: 1   Semaglutide,0.25 or 0.5MG/DOS, (OZEMPIC, 0.25 OR 0.5 MG/DOSE,) 2 MG/1.5ML SOPN, Inject 0.5 mg into the skin once a week., Disp: 1.5 mL, Rfl: 11   vitamin B-12 (CYANOCOBALAMIN) 100 MCG tablet, Take 100 mcg by mouth daily., Disp: , Rfl:   Physical exam:  Vitals:   01/10/22 0945  BP: 132/67  Pulse: 96  Resp: 17  Temp: (!) 97 F (36.1 C)  TempSrc: Tympanic   Physical Exam Constitutional:      Appearance: She is not ill-appearing.  HENT:     Mouth/Throat:     Pharynx: No oropharyngeal exudate or posterior  oropharyngeal erythema.  Pulmonary:     Effort: Pulmonary effort is normal. No respiratory distress.  Abdominal:     General: There is no distension.     Tenderness: There  is no abdominal tenderness.  Skin:    General: Skin is dry.     Coloration: Skin is pale.  Neurological:     Mental Status: She is alert and oriented to person, place, and time.  Psychiatric:        Mood and Affect: Mood normal.        Behavior: Behavior normal.         Latest Ref Rng & Units 01/10/2022    9:40 AM  CMP  Glucose 70 - 99 mg/dL 170   BUN 8 - 23 mg/dL 11   Creatinine 0.44 - 1.00 mg/dL 0.78   Sodium 135 - 145 mmol/L 135   Potassium 3.5 - 5.1 mmol/L 3.6   Chloride 98 - 111 mmol/L 101   CO2 22 - 32 mmol/L 23   Calcium 8.9 - 10.3 mg/dL 9.2   Total Protein 6.5 - 8.1 g/dL 7.1   Total Bilirubin 0.3 - 1.2 mg/dL 0.6   Alkaline Phos 38 - 126 U/L 196   AST 15 - 41 U/L 47   ALT 0 - 44 U/L 18       Latest Ref Rng & Units 01/10/2022    9:40 AM  CBC  WBC 4.0 - 10.5 K/uL 2.3   Hemoglobin 12.0 - 15.0 g/dL 8.7   Hematocrit 36.0 - 46.0 % 27.0   Platelets 150 - 400 K/uL 151     No images are attached to the encounter.  No results found.  Assessment and plan- Patient is a 71 y.o. female diagnosed with breast cancer who presents to Symptom Management for multiple complaints  Recurrent triple negative breast cancer- on sacituzumab, s/p cycle 2. Tolerating treatment with moderate/mild difficulty- diarrhea/fatigue/taste (see below).  Diarrhea- likely related to sacituzumab. Grade 1 but ongoing. On lomotil with little effect. Receiving atropine with chemotherapy. Atropine 0.5 IV today. IV fluids. Decadron 5 mg (increased anxiety with 10 mg dose) IV today as well. Hold sacituzumab until diarrhea resolved to < grade 1 and reduce subsequent doses. no improvement with lomotil. Stool studies today given clinical symptoms and neutropenia- C diff and GI panel which were negative.  Abdominal Cramping- likely related  to sacituzumab as well. Will trial bentyl 20 mg q8h prn.  Neutropenia- ANC 1.0. Reviewed neutropenic precautions.  Weight loss & taste changes- likely related to sacituzumab. Discussed changes in food selection. Scheduled meals, frequent snacks.  Hypomagnesemia- related to chemotherapy. Limit oral magnesium given ongoing diarrhea. May require iv supplementation regularly.  Anemia- hmg 8.7. Receiving venofer today.   Rtc on Thursday or Friday for labs, +/- fluids- la   Visit Diagnosis 1. Diarrhea, unspecified type   2. Hypomagnesemia   3. Abdominal cramping   4. Altered taste    Patient expressed understanding and was in agreement with this plan. She also understands that She can call clinic at any time with any questions, concerns, or complaints.   Thank you for allowing me to participate in the care of this very pleasant patient.   Beckey Rutter, DNP, AGNP-C [place] [facility] [place]

## 2022-01-10 NOTE — Progress Notes (Signed)
Pt reports trouble eating due to change in taste and abdominal cramping and diarrhea every time she eats. Pt is noted to have a decrease in weight . ANC 1.0. Per Dr. Rogue Bussing hold treatment and Venofer, Beckey Rutter NP to see pt chairside.

## 2022-01-11 LAB — GI PATHOGEN PANEL BY PCR, STOOL

## 2022-01-11 MED ORDER — DICYCLOMINE HCL 20 MG PO TABS: 20.0000 mg | ORAL_TABLET | Freq: Three times a day (TID) | ORAL | 0 refills | Status: AC | PRN

## 2022-01-12 ENCOUNTER — Other Ambulatory Visit: Payer: Self-pay | Admitting: Nurse Practitioner

## 2022-01-12 ENCOUNTER — Encounter: Payer: Self-pay | Admitting: Internal Medicine

## 2022-01-12 ENCOUNTER — Inpatient Hospital Stay: Payer: PPO

## 2022-01-12 VITALS — BP 105/64 | HR 86 | Temp 98.0°F | Resp 18

## 2022-01-12 DIAGNOSIS — C50211 Malignant neoplasm of upper-inner quadrant of right female breast: Secondary | ICD-10-CM

## 2022-01-12 DIAGNOSIS — Z5112 Encounter for antineoplastic immunotherapy: Secondary | ICD-10-CM | POA: Diagnosis not present

## 2022-01-12 DIAGNOSIS — R197 Diarrhea, unspecified: Secondary | ICD-10-CM

## 2022-01-12 LAB — COMPREHENSIVE METABOLIC PANEL
ALT: 15 U/L (ref 0–44)
AST: 43 U/L — ABNORMAL HIGH (ref 15–41)
Albumin: 3.1 g/dL — ABNORMAL LOW (ref 3.5–5.0)
Alkaline Phosphatase: 176 U/L — ABNORMAL HIGH (ref 38–126)
Anion gap: 12 (ref 5–15)
BUN: 11 mg/dL (ref 8–23)
CO2: 22 mmol/L (ref 22–32)
Calcium: 9.3 mg/dL (ref 8.9–10.3)
Chloride: 103 mmol/L (ref 98–111)
Creatinine, Ser: 0.78 mg/dL (ref 0.44–1.00)
GFR, Estimated: >60 mL/min (ref >60)
Glucose, Bld: 187 mg/dL — ABNORMAL HIGH (ref 70–99)
Potassium: 3.7 mmol/L (ref 3.5–5.1)
Sodium: 137 mmol/L (ref 135–145)
Total Bilirubin: 0.4 mg/dL (ref 0.3–1.2)
Total Protein: 7.3 g/dL (ref 6.5–8.1)

## 2022-01-12 LAB — CBC WITH DIFFERENTIAL/PLATELET
Abs Immature Granulocytes: 0.07 10*3/uL (ref 0.00–0.07)
Basophils Absolute: 0 10*3/uL (ref 0.0–0.1)
Basophils Relative: 1 %
Eosinophils Absolute: 0.1 10*3/uL (ref 0.0–0.5)
Eosinophils Relative: 3 %
HCT: 26.2 % — ABNORMAL LOW (ref 36.0–46.0)
Hemoglobin: 8.1 g/dL — ABNORMAL LOW (ref 12.0–15.0)
Immature Granulocytes: 3 %
Lymphocytes Relative: 28 %
Lymphs Abs: 0.7 10*3/uL (ref 0.7–4.0)
MCH: 26.9 pg (ref 26.0–34.0)
MCHC: 30.9 g/dL (ref 30.0–36.0)
MCV: 87 fL (ref 80.0–100.0)
Monocytes Absolute: 0.4 10*3/uL (ref 0.1–1.0)
Monocytes Relative: 14 %
Neutro Abs: 1.4 10*3/uL — ABNORMAL LOW (ref 1.7–7.7)
Neutrophils Relative %: 51 %
Platelets: 123 10*3/uL — ABNORMAL LOW (ref 150–400)
RBC: 3.01 MIL/uL — ABNORMAL LOW (ref 3.87–5.11)
RDW: 23.4 % — ABNORMAL HIGH (ref 11.5–15.5)
WBC: 2.6 10*3/uL — ABNORMAL LOW (ref 4.0–10.5)
nRBC: 1.5 % — ABNORMAL HIGH (ref 0.0–0.2)

## 2022-01-12 LAB — MAGNESIUM: Magnesium: 1.6 mg/dL — ABNORMAL LOW (ref 1.7–2.4)

## 2022-01-12 MED ORDER — MAGNESIUM SULFATE 2 GM/50ML IV SOLN
2.0000 g | Freq: Once | INTRAVENOUS | Status: AC
  Administered 2022-01-12: 2 g via INTRAVENOUS
  Filled 2022-01-12: qty 50

## 2022-01-12 MED ORDER — SODIUM CHLORIDE 0.9% FLUSH
10.0000 mL | Freq: Once | INTRAVENOUS | Status: AC | PRN
  Administered 2022-01-12: 10 mL
  Filled 2022-01-12: qty 10

## 2022-01-12 MED ORDER — HEPARIN SOD (PORK) LOCK FLUSH 100 UNIT/ML IV SOLN
500.0000 [IU] | Freq: Once | INTRAVENOUS | Status: AC | PRN
  Administered 2022-01-12: 500 [IU]
  Filled 2022-01-12: qty 5

## 2022-01-12 MED ORDER — SODIUM CHLORIDE 0.9 % IV SOLN
Freq: Once | INTRAVENOUS | Status: AC
  Filled 2022-01-12: qty 250

## 2022-01-12 NOTE — Progress Notes (Signed)
Pt feeling much better. Discussed neutropenic precautions. Pt made aware of hgb 8.1. Pt prefers not to take blood products if she doesn't have to.Received 1 L NS and magnesium replacement.

## 2022-01-13 ENCOUNTER — Telehealth: Payer: Self-pay

## 2022-01-13 NOTE — Telephone Encounter (Signed)
Informed pt that patient assistance meds have arrived in our office for pt to come p/u M-F 8-5  Meds include:5 boxes of Ozempic Meds have been placed in pt assistance fridge

## 2022-01-14 ENCOUNTER — Other Ambulatory Visit: Payer: Self-pay | Admitting: Internal Medicine

## 2022-01-14 DIAGNOSIS — G620 Drug-induced polyneuropathy: Secondary | ICD-10-CM

## 2022-01-16 ENCOUNTER — Other Ambulatory Visit: Payer: Self-pay | Admitting: Internal Medicine

## 2022-01-16 ENCOUNTER — Telehealth: Payer: Self-pay | Admitting: Internal Medicine

## 2022-01-16 MED ORDER — OMEPRAZOLE 20 MG PO CPDR: 20.0000 mg | DELAYED_RELEASE_CAPSULE | Freq: Every day | ORAL | 3 refills | Status: AC

## 2022-01-16 NOTE — Telephone Encounter (Signed)
Medication has been refilled.

## 2022-01-16 NOTE — Telephone Encounter (Signed)
Pt need refill on Omeprazole sent to [facility]

## 2022-01-17 ENCOUNTER — Encounter: Payer: Self-pay | Admitting: Internal Medicine

## 2022-01-17 ENCOUNTER — Other Ambulatory Visit: Payer: Self-pay | Admitting: Family

## 2022-01-17 NOTE — Telephone Encounter (Signed)
Last visit--10/10 # DM/poorly controlled- continue metformin 2000 mg q AM [Dr.Tullo]; on glipizide 10 mg XL. Continue follow up with  PCP.  On CGM.- FBG- 144-STABLE   Next visit 10/31  Dr. B filled on 8/1, #90, 0 refills

## 2022-01-18 ENCOUNTER — Encounter: Payer: Self-pay | Admitting: Internal Medicine

## 2022-01-18 ENCOUNTER — Other Ambulatory Visit: Payer: Self-pay | Admitting: *Deleted

## 2022-01-18 MED ORDER — HYDROCODONE-ACETAMINOPHEN 5-325 MG PO TABS: 1.0000 | ORAL_TABLET | Freq: Four times a day (QID) | ORAL | 0 refills | Status: DC | PRN

## 2022-01-18 NOTE — Telephone Encounter (Signed)
Pt has picked up medications.  ?

## 2022-01-21 ENCOUNTER — Telehealth: Payer: Self-pay | Admitting: Oncology

## 2022-01-21 NOTE — Telephone Encounter (Signed)
Patient called to report nausea and vomiting after eating.  She vomited last night. Today no vomiting yet, but she has not eaten much either. She is able to keep some water and gadarate down.  Food tastes bad.  She has taken ondansetron 3 times today already. I recommend her to try prochlorperazine.   I offer to send her a Rx of phenergan and she declines. Recommend her to go to ER if she continues to have nausea and vomiting. She voices understanding and appreciates the advise.  Cc Dr.B

## 2022-01-23 MED FILL — Dexamethasone Sodium Phosphate Inj 100 MG/10ML: INTRAMUSCULAR | Qty: 1 | Status: AC

## 2022-01-23 MED FILL — Fosaprepitant Dimeglumine For IV Infusion 150 MG (Base Eq): INTRAVENOUS | Qty: 5 | Status: AC

## 2022-01-23 NOTE — Telephone Encounter (Signed)
Patient says the nausea is improving .  Was able to drink lots of fluids yesterday and has eaten breakfast this morning.    Does report that her hip pain is getting worse.  She took pain pill at 7 am and pain currently 8/10 pain scale.  The pain keeps her up at night and wondering if she can take something stronger.

## 2022-01-24 ENCOUNTER — Other Ambulatory Visit: Payer: Self-pay | Admitting: *Deleted

## 2022-01-24 ENCOUNTER — Inpatient Hospital Stay: Payer: PPO

## 2022-01-24 ENCOUNTER — Encounter: Payer: Self-pay | Admitting: Internal Medicine

## 2022-01-24 ENCOUNTER — Ambulatory Visit
Admission: RE | Admit: 2022-01-24 | Discharge: 2022-01-24 | Disposition: A | Discharge status: Home / Self Care | Payer: PPO | Source: Ambulatory Visit | Attending: Radiation Oncology | Admitting: Radiation Oncology

## 2022-01-24 ENCOUNTER — Other Ambulatory Visit: Payer: Self-pay | Admitting: Internal Medicine

## 2022-01-24 ENCOUNTER — Telehealth: Payer: Self-pay | Admitting: Internal Medicine

## 2022-01-24 ENCOUNTER — Inpatient Hospital Stay (HOSPITAL_BASED_OUTPATIENT_CLINIC_OR_DEPARTMENT_OTHER): Payer: PPO | Admitting: Internal Medicine

## 2022-01-24 VITALS — BP 110/68 | HR 100 | Temp 98.6°F | Resp 19 | Wt 176.2 lb

## 2022-01-24 DIAGNOSIS — C7951 Secondary malignant neoplasm of bone: Secondary | ICD-10-CM | POA: Diagnosis not present

## 2022-01-24 DIAGNOSIS — D6481 Anemia due to antineoplastic chemotherapy: Secondary | ICD-10-CM

## 2022-01-24 DIAGNOSIS — C50211 Malignant neoplasm of upper-inner quadrant of right female breast: Secondary | ICD-10-CM | POA: Diagnosis not present

## 2022-01-24 DIAGNOSIS — D4861 Neoplasm of uncertain behavior of right breast: Secondary | ICD-10-CM

## 2022-01-24 DIAGNOSIS — C50911 Malignant neoplasm of unspecified site of right female breast: Secondary | ICD-10-CM | POA: Diagnosis not present

## 2022-01-24 DIAGNOSIS — Z171 Estrogen receptor negative status [ER-]: Secondary | ICD-10-CM

## 2022-01-24 DIAGNOSIS — D649 Anemia, unspecified: Secondary | ICD-10-CM | POA: Diagnosis not present

## 2022-01-24 DIAGNOSIS — C50919 Malignant neoplasm of unspecified site of unspecified female breast: Secondary | ICD-10-CM

## 2022-01-24 DIAGNOSIS — Z5112 Encounter for antineoplastic immunotherapy: Secondary | ICD-10-CM | POA: Diagnosis not present

## 2022-01-24 LAB — ABO/RH: ABO/RH(D): A POS

## 2022-01-24 LAB — CBC WITH DIFFERENTIAL/PLATELET
Abs Immature Granulocytes: 0.11 10*3/uL — ABNORMAL HIGH (ref 0.00–0.07)
Basophils Absolute: 0 10*3/uL (ref 0.0–0.1)
Basophils Relative: 1 %
Eosinophils Absolute: 0 10*3/uL (ref 0.0–0.5)
Eosinophils Relative: 1 %
HCT: 23.7 % — ABNORMAL LOW (ref 36.0–46.0)
Hemoglobin: 7.3 g/dL — ABNORMAL LOW (ref 12.0–15.0)
Immature Granulocytes: 4 %
Lymphocytes Relative: 28 %
Lymphs Abs: 0.9 10*3/uL (ref 0.7–4.0)
MCH: 26.7 pg (ref 26.0–34.0)
MCHC: 30.8 g/dL (ref 30.0–36.0)
MCV: 86.8 fL (ref 80.0–100.0)
Monocytes Absolute: 0.4 10*3/uL (ref 0.1–1.0)
Monocytes Relative: 14 %
Neutro Abs: 1.7 10*3/uL (ref 1.7–7.7)
Neutrophils Relative %: 52 %
Platelets: 141 10*3/uL — ABNORMAL LOW (ref 150–400)
RBC: 2.73 MIL/uL — ABNORMAL LOW (ref 3.87–5.11)
RDW: 23.4 % — ABNORMAL HIGH (ref 11.5–15.5)
WBC: 3.1 10*3/uL — ABNORMAL LOW (ref 4.0–10.5)
nRBC: 1.6 % — ABNORMAL HIGH (ref 0.0–0.2)

## 2022-01-24 LAB — COMPREHENSIVE METABOLIC PANEL
ALT: 23 U/L (ref 0–44)
AST: 67 U/L — ABNORMAL HIGH (ref 15–41)
Albumin: 2.8 g/dL — ABNORMAL LOW (ref 3.5–5.0)
Alkaline Phosphatase: 331 U/L — ABNORMAL HIGH (ref 38–126)
Anion gap: 9 (ref 5–15)
BUN: 10 mg/dL (ref 8–23)
CO2: 19 mmol/L — ABNORMAL LOW (ref 22–32)
Calcium: 9 mg/dL (ref 8.9–10.3)
Chloride: 106 mmol/L (ref 98–111)
Creatinine, Ser: 0.64 mg/dL (ref 0.44–1.00)
GFR, Estimated: >60 mL/min (ref >60)
Glucose, Bld: 153 mg/dL — ABNORMAL HIGH (ref 70–99)
Potassium: 4.6 mmol/L (ref 3.5–5.1)
Sodium: 134 mmol/L — ABNORMAL LOW (ref 135–145)
Total Bilirubin: 0.5 mg/dL (ref 0.3–1.2)
Total Protein: 6.9 g/dL (ref 6.5–8.1)

## 2022-01-24 LAB — MAGNESIUM: Magnesium: 1.6 mg/dL — ABNORMAL LOW (ref 1.7–2.4)

## 2022-01-24 LAB — PREPARE RBC (CROSSMATCH)

## 2022-01-24 LAB — PHOSPHORUS: Phosphorus: 3.1 mg/dL (ref 2.5–4.6)

## 2022-01-24 MED ORDER — HEPARIN SOD (PORK) LOCK FLUSH 100 UNIT/ML IV SOLN
500.0000 [IU] | Freq: Once | INTRAVENOUS | Status: AC | PRN
  Administered 2022-01-24: 500 [IU]
  Filled 2022-01-24: qty 5

## 2022-01-24 MED ORDER — ZOLEDRONIC ACID 4 MG/100ML IV SOLN
4.0000 mg | Freq: Once | INTRAVENOUS | Status: AC
  Administered 2022-01-24: 4 mg via INTRAVENOUS
  Filled 2022-01-24: qty 100

## 2022-01-24 MED ORDER — HYDROCODONE-ACETAMINOPHEN 5-325 MG PO TABS: 1.0000 | ORAL_TABLET | Freq: Four times a day (QID) | ORAL | 0 refills | Status: DC | PRN

## 2022-01-24 MED ORDER — SODIUM CHLORIDE 0.9% FLUSH
10.0000 mL | INTRAVENOUS | Status: DC | PRN
  Filled 2022-01-24: qty 10

## 2022-01-24 MED ORDER — SODIUM CHLORIDE 0.9 % IV SOLN
Freq: Once | INTRAVENOUS | Status: AC
  Filled 2022-01-24: qty 250

## 2022-01-24 MED ORDER — FENTANYL 50 MCG/HR TD PT72: 1.0000 | MEDICATED_PATCH | TRANSDERMAL | 0 refills | Status: DC

## 2022-01-24 MED ORDER — MAGNESIUM SULFATE 2 GM/50ML IV SOLN
2.0000 g | Freq: Once | INTRAVENOUS | Status: AC
  Administered 2022-01-24: 2 g via INTRAVENOUS
  Filled 2022-01-24: qty 50

## 2022-01-24 NOTE — Progress Notes (Signed)
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  Past medical history of DM, GERD, HTN, HLD.  Patient receiving sacituzumab-govitecan currently on hold.  Met with patient during infusion, receiving fluids and Magnesium today.  Patient reports that food taste like metal.  She is using plastic utensils.  Also having issues with nausea, vomiting and diarrhea.  Able to eat chicken and rice soup, potato soup, salty foods.  Says that smells increase nausea.  Was not able to tolerate protein shakes as made her sick to her stomach. Patient is taking nausea medications and antidiarrheals     Medications: omeprezole, tums, glipizide, metformin, ozempic, zofran, vit B 12   Labs:  Na 134, Magnesium 1.6   Anthropometrics:   Height: 67 inches Weight: 176 lb 3.2 oz 187 lb on 6/26 BMI: 27  6% weight loss in the last 4 months   Estimated Energy Needs  Kcals: 2000-2400 Protein: 100-120 g Fluid: 2000-2400 ml   NUTRITION DIAGNOSIS: Inadequate oral intake related to cancer related treatment side effects as evidenced by 6% weight loss in the last 4 months and poor appetite   INTERVENTION:  Discussed strategies to help with taste change. Handout provided Discussed strategies to help with nausea/vomiting/diarrhea.  Handout provided Provided samples of enterade to try.  Recommend 2 per day either 30 minutes before a meal or 1 hour after a meal.  Can order more online if able to drink.  Can help with GI upset. Contact information provided   MONITORING, EVALUATION, GOAL: weight trends, intake   Next Visit: to be determined  Penny Frisbie B. Zenia Resides, [place], [place] Registered Dietitian [phone]

## 2022-01-24 NOTE — Progress Notes (Signed)
I connected with Ashlee Cox on 01/24/22 at  9:00 AM EDT by video enabled telemedicine visit and verified that I am speaking with the correct person using two identifiers.  I discussed the limitations, risks, security and privacy concerns of performing an evaluation and management service by telemedicine and the availability of in-person appointments. I also discussed with the patient that there may be a patient responsible charge related to this service. The patient expressed understanding and agreed to proceed.    Other persons participating in the visit and their role in the encounter: RN/medical reconciliation Patient's location: office Provider's location: Home  Oncology History Overview Note  #June 2021- Right breast cancer-T2N1; 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 Byrnett.] [id number] Carbo-taxol-AC;  JAN 2022-s/p Lumpectomy & ALND- [ypT1a (75m ) ypN1 (2/11 LN-positive)]; s/p radiation end of April 2022.  #Early June 2022-Xeloda 1000 mg per metered square [2000] 2 weeks on 1 week of 6-8 cycles; July 11th 2022- STARTING cycle #3-cut down the dose to 1500 mg BID[sec to HFS].   # JAN 2023- A. SKIN, RIGHT BREAST; PUNCH BIOPSY: [Dr.Byrnett] - INVASIVE CARCINOMA WITH DERMAL INFILTRATION AND LYMPHATIC INVASION. ER- weak POSISTIVE [10%]; PR/her2 NEG [[facility] 0]; JAN 20th, 2023- IMPRESSION: 1. Although the hypermetabolic right axillary node has resolved since the prior PET, there is a new hypermetabolic right low cervical/supraclavicular node, suspicious for nodal metastasis. 2. Decrease in right breast hypermetabolism with nonspecific overlying skin thickening in the setting of prior lumpectomy. 3. Low-level hypermetabolism within the T2 vertebral body, suspicious for isolated osseous metastasis. This could be confirmed with pre and postcontrast thoracic spine MRI. 4. Hypermetabolic thoracic nodes, slightly progressive. Given progression, nodal metastasis are slightly favored over  reactive etiologies. 5. Incidental findings, including: Aortic Atherosclerosis (ICD10-I70.0). Tiny hiatal hernia. Hepatic steatosis with nonspecific caudate lobe enlargemen  Comment:  The carcinoma is morphologically similar to the post-neoadjuvant mammary  carcinoma seen in the January 2022 right breast wide excision and lymph  Nodes  # FEB 6th-Taxol single agent #1; FEB 13th, 2023- [CPS =15]; Taxol-Keytruda  MAY 2023- CT CHEST:  Since the PET of 04/15/2021, enlargement of the previously described hypermetabolic right low cervical/supraclavicular nodal metastasis. Foci of hyperenhancement within the right breast are new or more well-defined since 02/02/2021, suspicious for progressive residual/recurrent disease. Nonspecific mediastinal nodes, similar to slightly decreased in size;  New sclerosis at the site of a known T2 vertebral body metastasis.   # s/p palliative radiation therapy to her right supraclavicular nodal metastasis as well as T2 vertebral body [30 Gray in 10 fractions ;[5/31 to  6/13].  CONTINUED TAXOL-KEYTRUDA; SEP 6th 2023-bone scan progressive disease.  Discontinue Taxol-Keytruda  #  SEP 19th 2023- SACITUZUMAB GOVITECAN [at 80% dose]   DIAGNOSIS: Right breast cancer triple negative      Carcinoma of upper-inner quadrant of right breast in female, estrogen receptor negative ([facility]  10/07/2019 Initial Diagnosis   Carcinoma of upper-inner quadrant of right breast in female, estrogen receptor negative ([facility]   12/04/2019 Genetic Testing   Negative genetic testing. No pathogenic variants identified on the [facility] Common Hereditary Cancers Panel + Skin Cancers Panel. The report date is 12/04/2019.  The Common Hereditary Cancers Panel + Skin Cancers Panel offered by [facility] includes sequencing and/or deletion duplication testing of the following 54 genes: APC*, ATM*, AXIN2, BAP1, BARD1, BMPR1A, BRCA1, BRCA2, BRIP1, CDH1, CDK4, CDKN2A (p14ARF), CDKN2A (p16INK4a), CHEK2, CTNNA1,  DICER1*, EPCAM*, GREM1*, HOXB13, KIT, MEN1*, MITF*, MLH1*, MSH2*, MSH3*, MSH6*, MUTYH, NBN, NF1*, NTHL1, PALB2, PDGFRA, PMS2*, POLD1*, POLE, POT1,  PTCH1, PTEN*, RAD50, RAD51C, RAD51D, RB1*, RNF43, SDHA*, SDHB, SDHC*, SDHD, SMAD4, SMARCA4, STK11, SUFU, TP53, TSC1*, TSC2, VHL.    05/07/2020 Cancer Staging   Staging form: Breast, AJCC 8th Edition - Clinical: Stage IIIB (cT2, cN1, cM0, G3, ER-, PR-, HER2-) - Signed by Cammie Sickle, MD on 05/07/2020   04/18/2021 Cancer Staging   Staging form: Breast, AJCC 8th Edition - Pathologic: Stage IV (pT4d, pN3c, cM1, ER+, PR-, HER2-) - Signed by Cammie Sickle, MD on 04/18/2021 Nuclear grade: G3    Chief Complaint: Breast cancer    History of present illness:Ashlee Cox 70 y.o.  female with history of triple negative breast cancer currently on palliative chemotherapy with scaituzumab-is here for follow-up.  Worsening pain in right hip. Difficulty in walking. No falls. On a scale of 10/10. Radiates down the right leg. Currently on fenatnyl patch; and hydrocodone 3-4 a day.   Patient feeling poorly-she did not get cycle number 2-day 8 chemotherapy because of ongoing nausea vomiting diarrhea.   She continues to have intermittent loose stools.  Also intermittent nausea with vomiting.  Slightly improved with antiemetics and Lomotil.  Denies any headaches.  Overall she feels poorly.  She is in recheck today.  Observation/objective: Alert & oriented x 3. In No acute distress.   Assessment and plan: Carcinoma of upper-inner quadrant of right breast in female, estrogen receptor negative ([place]) #Recurrent right breast cancer-s/p breast skin biopsy [FEB 2023- ER positive /PR HER2-0; however 2017-ER-NEGATIVE; -HER2 NEU-1+/LOW]; - recurrent- stage IV; NGS -FEB 2023- CPS =15%. Currently on Keytruda- taxol [2/13]; BONE SCAN- November 24, 2021- Scattered sites of abnormal osseous tracer uptake  consistent with osseous metastases, including BILATERAL  femora.   12/02/2021- PET SCAN-  Three hypermetabolic right breast nodules with a right supraclavicular nodal metastasis and diffuse osseous metastatic disease.  Subpleural nodular consolidation in the apical left upper lobe is new from 10/21/2021 and may be infectious/inflammatory in etiology or related to evolving radiation therapy.   #Patient currently  S/p  Sacituzumab govitecan HOLD chemotherapy cycle number 2-day 8.  Patient tolerating chemotherapy with mild to moderate difficulty- see below-diarrhea/fatigue/poor taste/worsening pain weight loss.  #Fatigue multifactorial-anemia hemoglobin 7.4.  Proceed with PRBC transfusion tomorrow.  Ordered.  # Diarrhea- G-1- recommend continue lomotil as planned.   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   # T2 thoracic metastatic disease [confirmed MRI Jan 2023]-currently s/p Radiation [ 6/13-last] SEP 2023-PET scan shows progressive bone lesions;  Currently s/p left hip RT x10 fractions [ start 9/20]-however, worsening right hip pain-discussed with Dr. Donella Stade.  Will increase hydrocodone 1 to 2 pills every 6 hours new prescription given.  Please also increase fentanyl patch to 50 mcg.  New prescription given.  Continue Zometa.  Calcium normal.  # Electrolyte abnormalities: HYPO Kalemia potassium 3.2 continue K-Dur 20 mg twice a day recommend inreasing fluid intake; hypomagnesemia-magnesium today 1.6 proceed with magnesium infusion.  # PN-1-2 sec to taxol-on Neurontin 200 qhs-STABLE.   # DM/poorly controlled- continue metformin 2000 mg q AM [Dr.Tullo]; on glipizide 10 mg XL. Continue follow up with  PCP.  On CGM.- FBG- 144STABLE.    # Iron def anemia-[Iron studies- July 2023] continue Venofer x4; Hb 7.4.  Proceed with PRBC transfusion.  # Mediport: Functioning.   Last zometa on 10/31.    # DISPOSITION: # No Chemo today;  # Zometa today # Magnesium today # 1 unit  PRBC tomorrow # cancel 11/07 appts  # Follow up with MD- TBD-Dr.B       Follow-up instructions:  I discussed the assessment and treatment plan with the patient.  The patient was provided an opportunity to ask questions and all were answered.  The patient agreed with the plan and demonstrated understanding of instructions.  The patient was advised to call back or seek an in person evaluation if the symptoms worsen or if the condition fails to improve as anticipated.    Dr. Charlaine Dalton Marquez at [facility] 01/24/2022 9:44 AM

## 2022-01-24 NOTE — Progress Notes (Signed)
Patient states she wants to talk about a stronger pain med. She states she is on hydrocodone but its not doing anything for the pain. Patient states she has not  been able to eat.   Patient states her son whom is paramedic gave her some fluids on Sunday. ("1,000 cc fluids and 500 cc of something else") Patient states pain gets worse about 5 or 6 in the evening.

## 2022-01-24 NOTE — Patient Instructions (Signed)
[facility] CANCER CTR AT [place]  Discharge Instructions: Thank you for choosing [facility] to provide your oncology and hematology care.  If you have a lab appointment with the [facility], please go directly to the [facility] and check in at the registration area.  Wear comfortable clothing and clothing appropriate for easy access to any Portacath or PICC line.   We strive to give you quality time with your provider. You may need to reschedule your appointment if you arrive late (15 or more minutes).  Arriving late affects you and other patients whose appointments are after yours.  Also, if you miss three or more appointments without notifying the office, you may be dismissed from the clinic at the provider's discretion.      For prescription refill requests, have your pharmacy contact our office and allow 72 hours for refills to be completed.    Today you received the following chemotherapy and/or immunotherapy agents Zometa and Magnesium Sulfate.      To help prevent nausea and vomiting after your treatment, we encourage you to take your nausea medication as directed.  BELOW ARE SYMPTOMS THAT SHOULD BE REPORTED IMMEDIATELY: *FEVER GREATER THAN 100.4 F (38 C) OR HIGHER *CHILLS OR SWEATING *NAUSEA AND VOMITING THAT IS NOT CONTROLLED WITH YOUR NAUSEA MEDICATION *UNUSUAL SHORTNESS OF BREATH *UNUSUAL BRUISING OR BLEEDING *URINARY PROBLEMS (pain or burning when urinating, or frequent urination) *BOWEL PROBLEMS (unusual diarrhea, constipation, pain near the anus) TENDERNESS IN MOUTH AND THROAT WITH OR WITHOUT PRESENCE OF ULCERS (sore throat, sores in mouth, or a toothache) UNUSUAL RASH, SWELLING OR PAIN  UNUSUAL VAGINAL DISCHARGE OR ITCHING   Items with * indicate a potential emergency and should be followed up as soon as possible or go to the Emergency Department if any problems should occur.  Please show the CHEMOTHERAPY ALERT CARD or IMMUNOTHERAPY ALERT  CARD at check-in to the Emergency Department and triage nurse.  Should you have questions after your visit or need to cancel or reschedule your appointment, please contact [facility] CANCER [place] AT [place]  [phone] and follow the prompts.  Office hours are 8:00 a.m. to 4:30 p.m. Monday - Friday. Please note that voicemails left after 4:00 p.m. may not be returned until the following business day.  We are closed weekends and major holidays. You have access to a nurse at all times for urgent questions. Please call the main number to the clinic [phone] and follow the prompts.  For any non-urgent questions, you may also contact your provider using MyChart. We now offer e-Visits for anyone 12 and older to request care online for non-urgent symptoms. For details visit [email or web address].   Also download the MyChart app! Go to the app store, search "MyChart", open the app, select [place], and log in with your MyChart username and password.  Masks are optional in the cancer centers. If you would like for your care team to wear a mask while they are taking care of you, please let them know. For doctor visits, patients may have with them one support person who is at least 69 years old. At this time, visitors are not allowed in the infusion area.

## 2022-01-24 NOTE — Assessment & Plan Note (Addendum)
#  Recurrent right breast cancer-s/p breast skin biopsy [FEB 2023- ER positive /PR HER2-0; however 2017-ER-NEGATIVE; -HER2 NEU-1+/LOW]; - recurrent- stage IV; NGS -FEB 2023- CPS =15%. Currently on Keytruda- taxol [2/13]; BONE SCAN- November 24, 2021- Scattered sites of abnormal osseous tracer uptake  consistent with osseous metastases, including BILATERAL femora.   12/02/2021- PET SCAN-  Three hypermetabolic right breast nodules with a right supraclavicular nodal metastasis and diffuse osseous metastatic disease.  Subpleural nodular consolidation in the apical left upper lobe is new from 10/21/2021 and may be infectious/inflammatory in etiology or related to evolving radiation therapy.   #Patient currently  S/p  Sacituzumab govitecan HOLD chemotherapy cycle number 2-day 8.  Patient tolerating chemotherapy with mild to moderate difficulty- see below-diarrhea/fatigue/poor taste/worsening pain weight loss.  #Fatigue multifactorial-anemia hemoglobin 7.4.  Proceed with PRBC transfusion tomorrow.  Ordered.  # Diarrhea- G-1- recommend continue lomotil as planned.   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   # T2 thoracic metastatic disease [confirmed MRI Jan 2023]-currently s/p Radiation [ 6/13-last] SEP 2023-PET scan shows progressive bone lesions;  Currently s/p left hip RT x10 fractions [ start 9/20]-however, worsening right hip pain-discussed with Dr. Donella Stade.  Will increase hydrocodone 1 to 2 pills every 6 hours new prescription given.  Please also increase fentanyl patch to 50 mcg.  New prescription given.  Continue Zometa.  Calcium normal.  # Electrolyte abnormalities: HYPO Kalemia potassium 3.2 continue K-Dur 20 mg twice a day recommend inreasing fluid intake; hypomagnesemia-magnesium today 1.6 proceed with magnesium infusion.  # PN-1-2 sec to taxol-on Neurontin 200 qhs-STABLE.   # DM/poorly controlled- continue metformin  2000 mg q AM [Dr.Tullo]; on glipizide 10 mg XL. Continue follow up with  PCP.  On CGM.- FBG- 144STABLE.    # Iron def anemia-[Iron studies- July 2023] continue Venofer x4; Hb 7.4.  Proceed with PRBC transfusion.  # Mediport: Functioning.   Last zometa on 10/31.    # DISPOSITION: # No Chemo today;  # Zometa today # Magnesium today # 1 unit PRBC tomorrow # cancel 11/07 appts  # Follow up with MD- TBD-Dr.B

## 2022-01-24 NOTE — Progress Notes (Signed)
Radiation Oncology Follow up Note old patient new area metastasis to right hip  Name: Ashlee Cox   Date:   01/24/2022 MRN:  [id number] DOB: Sep 18, 1950    This 71 y.o. female presents to the clinic today for reevaluation of metastatic disease and patient with stage IV triple negative breast cancer previously treated to right supraclavicular fossa T2 vertebral body as well as her left hip now with increasing right hip pain  REFERRING PROVIDER: [name], MD  HPI: Patient is a 71 year old female well-known to our department having had multiple treatments for stage IV triple negative breast cancer.  She is complaining now of right hip pain PET scan shows increased activity in this region not only the acetabulum but in the pelvis and the proximal femoral shaft.  She is wheelchair-bound she has had an excellent response to palliative radiation therapy to her left hip which was recently completed..  COMPLICATIONS OF TREATMENT: none  FOLLOW UP COMPLIANCE: keeps appointments   PHYSICAL EXAM:  There were no vitals taken for this visit. Range of motion of the right lower extremity does not elicit pain.  Motor and sensory levels are equal and symmetric in the lower extremities.  She is wheelchair-bound frail well-developed well-nourished patient in NAD. HEENT reveals PERLA, EOMI, discs not visualized.  Oral cavity is clear. No oral mucosal lesions are identified. Neck is clear without evidence of cervical or supraclavicular adenopathy. Lungs are clear to A&P. Cardiac examination is essentially unremarkable with regular rate and rhythm without murmur rub or thrill. Abdomen is benign with no organomegaly or masses noted. Motor sensory and DTR levels are equal and symmetric in the upper and lower extremities. Cranial nerves II through XII are grossly intact. Proprioception is intact. No peripheral adenopathy or edema is identified. No motor or sensory levels are noted. Crude visual fields are within  normal range.  RADIOLOGY RESULTS: PET scan reviewed compatible with above-stated findings  PLAN: Present time elected ahead with 30 Gray in 10 fractions to her right hip.  We will use PET scan for establishment of treatment parameters.  Risks and benefits of treatment occluding skin reaction and fatigue were reviewed with the patient and her husband they both comprehend my treatment plan well.  I have personally set up and ordered CT simulation.  I would like to take this opportunity to thank you for allowing me to participate in the care of your patient.Noreene Filbert, MD

## 2022-01-24 NOTE — Telephone Encounter (Signed)
BC-please schedule-appointment on Nov 14th- MD; port/-labs CBC CMP; thyroid profile; Sacituzumab chemo  Thanks GB

## 2022-01-25 ENCOUNTER — Institutional Professional Consult (permissible substitution): Payer: PPO | Admitting: Radiation Oncology

## 2022-01-25 ENCOUNTER — Inpatient Hospital Stay: Payer: PPO

## 2022-01-25 DIAGNOSIS — R110 Nausea: Secondary | ICD-10-CM | POA: Insufficient documentation

## 2022-01-25 DIAGNOSIS — Z5112 Encounter for antineoplastic immunotherapy: Secondary | ICD-10-CM | POA: Insufficient documentation

## 2022-01-25 DIAGNOSIS — Z515 Encounter for palliative care: Secondary | ICD-10-CM | POA: Insufficient documentation

## 2022-01-25 DIAGNOSIS — G893 Neoplasm related pain (acute) (chronic): Secondary | ICD-10-CM | POA: Insufficient documentation

## 2022-01-25 DIAGNOSIS — G620 Drug-induced polyneuropathy: Secondary | ICD-10-CM | POA: Insufficient documentation

## 2022-01-25 DIAGNOSIS — Z510 Encounter for antineoplastic radiation therapy: Secondary | ICD-10-CM | POA: Insufficient documentation

## 2022-01-25 DIAGNOSIS — R200 Anesthesia of skin: Secondary | ICD-10-CM | POA: Insufficient documentation

## 2022-01-25 DIAGNOSIS — Z171 Estrogen receptor negative status [ER-]: Secondary | ICD-10-CM | POA: Insufficient documentation

## 2022-01-25 DIAGNOSIS — D509 Iron deficiency anemia, unspecified: Secondary | ICD-10-CM | POA: Insufficient documentation

## 2022-01-25 DIAGNOSIS — C7951 Secondary malignant neoplasm of bone: Secondary | ICD-10-CM | POA: Insufficient documentation

## 2022-01-25 DIAGNOSIS — E1165 Type 2 diabetes mellitus with hyperglycemia: Secondary | ICD-10-CM | POA: Insufficient documentation

## 2022-01-25 DIAGNOSIS — R197 Diarrhea, unspecified: Secondary | ICD-10-CM | POA: Insufficient documentation

## 2022-01-25 DIAGNOSIS — D61818 Other pancytopenia: Secondary | ICD-10-CM | POA: Insufficient documentation

## 2022-01-25 DIAGNOSIS — C50211 Malignant neoplasm of upper-inner quadrant of right female breast: Secondary | ICD-10-CM | POA: Insufficient documentation

## 2022-01-25 DIAGNOSIS — R630 Anorexia: Secondary | ICD-10-CM | POA: Insufficient documentation

## 2022-01-25 DIAGNOSIS — D649 Anemia, unspecified: Secondary | ICD-10-CM

## 2022-01-25 DIAGNOSIS — E032 Hypothyroidism due to medicaments and other exogenous substances: Secondary | ICD-10-CM | POA: Insufficient documentation

## 2022-01-25 DIAGNOSIS — Z79899 Other long term (current) drug therapy: Secondary | ICD-10-CM | POA: Insufficient documentation

## 2022-01-25 MED ORDER — SODIUM CHLORIDE 0.9% FLUSH
10.0000 mL | INTRAVENOUS | Status: AC | PRN
  Administered 2022-01-25: 10 mL
  Filled 2022-01-25: qty 10

## 2022-01-25 MED ORDER — SODIUM CHLORIDE 0.9% IV SOLUTION
250.0000 mL | Freq: Once | INTRAVENOUS | Status: DC
  Filled 2022-01-25: qty 250

## 2022-01-25 MED ORDER — HEPARIN SOD (PORK) LOCK FLUSH 100 UNIT/ML IV SOLN
500.0000 [IU] | Freq: Every day | INTRAVENOUS | Status: AC | PRN
  Administered 2022-01-25: 500 [IU]
  Filled 2022-01-25: qty 5

## 2022-01-25 MED ORDER — DIPHENHYDRAMINE HCL 25 MG PO CAPS
25.0000 mg | ORAL_CAPSULE | Freq: Once | ORAL | Status: AC
  Administered 2022-01-25: 25 mg via ORAL
  Filled 2022-01-25: qty 1

## 2022-01-25 MED ORDER — HEPARIN SOD (PORK) LOCK FLUSH 100 UNIT/ML IV SOLN
INTRAVENOUS | Status: AC
  Filled 2022-01-25: qty 5

## 2022-01-25 MED ORDER — ACETAMINOPHEN 325 MG PO TABS
650.0000 mg | ORAL_TABLET | Freq: Once | ORAL | Status: AC
  Administered 2022-01-25: 650 mg via ORAL
  Filled 2022-01-25: qty 2

## 2022-01-25 NOTE — Patient Instructions (Signed)
Blood Transfusion, Adult A blood transfusion is a procedure in which you receive blood through an IV tube. You may need this procedure because of: A bleeding disorder. An illness. An injury. A surgery. The blood may come from someone else (a donor). You may also be able to donate blood for yourself before a surgery. The blood given in a transfusion may be made up of different types of cells. You may get: Red blood cells. These carry oxygen to the cells in the body. Platelets. These help your blood to clot. Plasma. This is the liquid part of your blood. It carries proteins and other substances through the body. White blood cells. These help you fight infections. If you have a clotting disorder, you may also get other types of blood products. Depending on the type of blood product, this procedure may take 1-4 hours to complete. Tell your doctor about: Any bleeding problems you have. Any reactions you have had during a blood transfusion in the past. Any allergies you have. All medicines you are taking, including vitamins, herbs, eye drops, creams, and over-the-counter medicines. Any surgeries you have had. Any medical conditions you have. Whether you are pregnant or may be pregnant. What are the risks? Talk with your health care provider about risks. The most common problems include: A mild allergic reaction. This includes red, swollen areas of skin (hives) and itching. Fever or chills. This may be the body's response to new blood cells received. This may happen during or up to 4 hours after the transfusion. More serious problems may include: A serious allergic reaction. This includes breathing trouble or swelling around the face and lips. Too much fluid in the lungs. This may cause breathing problems. Lung injury. This causes breathing trouble and low oxygen in the blood. This can happen within hours of the transfusion or days later. Too much iron. This can happen after getting many blood  transfusions over a period of time. An infection or virus passed through the blood. This is rare. Donated blood is carefully tested before it is given. Your body's defense system (immune system) trying to attack the new blood cells. This is rare. Symptoms may include fever, chills, nausea, low blood pressure, and low back or chest pain. Donated cells attacking healthy tissues. This is rare. What happens before the procedure? You will have a blood test to find out your blood type. The test also finds out what type of blood your body will accept and matches it to the donor type. If you are going to have a planned surgery, you may be able to donate your own blood. This may be done in case you need a transfusion. You will have your temperature, blood pressure, and pulse checked. You may receive medicine to help prevent an allergic reaction. This may be done if you have had a reaction to a transfusion before. This medicine may be given to you by mouth or through an IV tube. What happens during the procedure?  An IV tube will be put into one of your veins. The bag of blood will be attached to your IV tube. Then, the blood will enter through your vein. Your temperature, blood pressure, and pulse will be checked often. This is done to find early signs of a transfusion reaction. Tell your nurse right away if you have any of these symptoms: Shortness of breath or trouble breathing. Chest or back pain. Fever or chills. Red, swollen areas of skin or itching. If you have any signs   or symptoms of a reaction, your transfusion will be stopped. You may also be given medicine. When the transfusion is finished, your IV tube will be taken out. Pressure may be put on the IV site for a few minutes. A bandage (dressing) will be put on the IV site. The procedure may vary among doctors and hospitals. What happens after the procedure? You will be monitored until you leave the hospital or clinic. This includes  checking your temperature, blood pressure, pulse, breathing rate, and blood oxygen level. Your blood may be tested to see how you have responded to the transfusion. You may be warmed with fluids or blankets. This is done to keep the temperature of your body normal. If you have your procedure in an outpatient setting, you will be told whom to contact to report any reactions. Where to find more information Visit the [employer]: redcross.org Summary A blood transfusion is a procedure in which you receive blood through an IV tube. The blood you are given may be made up of different blood cells. You may receive red blood cells, platelets, plasma, or white blood cells. Your temperature, blood pressure, and pulse will be checked often. After the procedure, your blood may be tested to see how you have responded. This information is not intended to replace advice given to you by your health care provider. Make sure you discuss any questions you have with your health care provider. Document Revised: 06/10/2021 Document Reviewed: 06/10/2021 Elsevier Patient Education  2023 [employer]  

## 2022-01-25 NOTE — Addendum Note (Signed)
Addended by: Vanice Sarah on: 01/25/2022 04:30 PM   Modules accepted: Orders

## 2022-01-26 DIAGNOSIS — Z171 Estrogen receptor negative status [ER-]: Secondary | ICD-10-CM | POA: Diagnosis not present

## 2022-01-26 DIAGNOSIS — C50911 Malignant neoplasm of unspecified site of right female breast: Secondary | ICD-10-CM | POA: Diagnosis not present

## 2022-01-26 DIAGNOSIS — C7951 Secondary malignant neoplasm of bone: Secondary | ICD-10-CM | POA: Diagnosis not present

## 2022-01-26 LAB — TYPE AND SCREEN
ABO/RH(D): A POS
Antibody Screen: NEGATIVE
Unit division: 0

## 2022-01-26 LAB — BPAM RBC
Blood Product Expiration Date: 202311242359
ISSUE DATE / TIME: 202311010914
Unit Type and Rh: 6200

## 2022-01-30 ENCOUNTER — Ambulatory Visit
Admission: RE | Admit: 2022-01-30 | Discharge: 2022-01-30 | Disposition: A | Discharge status: Home / Self Care | Payer: PPO | Source: Ambulatory Visit | Attending: Radiation Oncology | Admitting: Radiation Oncology

## 2022-01-30 DIAGNOSIS — C50911 Malignant neoplasm of unspecified site of right female breast: Secondary | ICD-10-CM | POA: Diagnosis not present

## 2022-01-30 DIAGNOSIS — Z171 Estrogen receptor negative status [ER-]: Secondary | ICD-10-CM | POA: Diagnosis not present

## 2022-01-30 DIAGNOSIS — Z510 Encounter for antineoplastic radiation therapy: Secondary | ICD-10-CM | POA: Diagnosis not present

## 2022-01-30 DIAGNOSIS — C7951 Secondary malignant neoplasm of bone: Secondary | ICD-10-CM | POA: Diagnosis not present

## 2022-01-31 ENCOUNTER — Ambulatory Visit: Payer: PPO

## 2022-01-31 ENCOUNTER — Inpatient Hospital Stay (HOSPITAL_BASED_OUTPATIENT_CLINIC_OR_DEPARTMENT_OTHER): Payer: PPO | Admitting: Hospice and Palliative Medicine

## 2022-01-31 ENCOUNTER — Other Ambulatory Visit: Payer: PPO

## 2022-01-31 DIAGNOSIS — Z171 Estrogen receptor negative status [ER-]: Secondary | ICD-10-CM | POA: Diagnosis not present

## 2022-01-31 DIAGNOSIS — Z515 Encounter for palliative care: Secondary | ICD-10-CM | POA: Diagnosis not present

## 2022-01-31 DIAGNOSIS — C50211 Malignant neoplasm of upper-inner quadrant of right female breast: Secondary | ICD-10-CM

## 2022-01-31 DIAGNOSIS — G893 Neoplasm related pain (acute) (chronic): Secondary | ICD-10-CM | POA: Diagnosis not present

## 2022-01-31 MED ORDER — HYDROCODONE-ACETAMINOPHEN 5-325 MG PO TABS: 1.0000 | ORAL_TABLET | ORAL | 0 refills | Status: DC | PRN

## 2022-01-31 NOTE — Progress Notes (Signed)
Virtual Visit via Telephone Note  I connected with Ashlee Cox on 01/31/22 at  3:00 PM EST by telephone and verified that I am speaking with the correct person using two identifiers.  Location: Patient: Home Provider: Clinic   I discussed the limitations, risks, security and privacy concerns of performing an evaluation and management service by telephone and the availability of in person appointments. I also discussed with the patient that there may be a patient responsible charge related to this service. The patient expressed understanding and agreed to proceed.   History of Present Illness: Ashlee Cox is a 71 y.o. female with multiple medical problems including triple negative right breast cancer, initially diagnosed in July 2021 at stage IIIb with recurrence and progression of stage IV in January 2020 with bony metastatic disease.  Patient is status post chemotherapy and immunotherapy, currently on hold due to poor tolerance.  She is also receiving XRT.    Observations/Objective: I called and spoke with patient/husband.  Patient is having persistent generalized pain.  Dr. Rogue Bussing recently increased fentanyl to 50 mcg every 72 hours and liberalize Norco to 1 to 2 tablets every 6 hours as needed for breakthrough pain.  However, reportedly pain remains poorly controlled.  Husband says that they are having to give the Norco a bit more frequently and cannot tell significant improvement.  Patient did start XRT last week.  No adverse effects from pain medications reported.  Assessment and Plan: Neoplasm related pain -in setting of diffuse osseous metastatic disease.  Continue transdermal fentanyl 50 mcg to 72 hours.  We will liberalize Norco 1 to 2 tablets every 4 hours as needed for breakthrough pain.  Hopefully, XRT will help improve pain.  If pain remains poorly controlled, can consider trial of steroid given skeletal/inflammatory involvement.  Could try rotating from [place] to  oxycodone and continue maximizing dosing of fentanyl versus rotating to a different long-acting opioid.  Follow Up Instructions: Follow-up telephone visit 2 weeks   I discussed the assessment and treatment plan with the patient. The patient was provided an opportunity to ask questions and all were answered. The patient agreed with the plan and demonstrated an understanding of the instructions.   The patient was advised to call back or seek an in-person evaluation if the symptoms worsen or if the condition fails to improve as anticipated.  I provided 10 minutes of non-face-to-face time during this encounter.   Irean Hong, NP

## 2022-02-01 DIAGNOSIS — C50911 Malignant neoplasm of unspecified site of right female breast: Secondary | ICD-10-CM | POA: Diagnosis not present

## 2022-02-01 DIAGNOSIS — Z171 Estrogen receptor negative status [ER-]: Secondary | ICD-10-CM | POA: Diagnosis not present

## 2022-02-01 DIAGNOSIS — C7951 Secondary malignant neoplasm of bone: Secondary | ICD-10-CM | POA: Diagnosis not present

## 2022-02-01 DIAGNOSIS — Z510 Encounter for antineoplastic radiation therapy: Secondary | ICD-10-CM | POA: Diagnosis not present

## 2022-02-02 ENCOUNTER — Ambulatory Visit: Admission: RE | Admit: 2022-02-02 | Payer: PPO | Source: Ambulatory Visit

## 2022-02-02 DIAGNOSIS — Z510 Encounter for antineoplastic radiation therapy: Secondary | ICD-10-CM | POA: Diagnosis not present

## 2022-02-02 DIAGNOSIS — C7951 Secondary malignant neoplasm of bone: Secondary | ICD-10-CM | POA: Diagnosis not present

## 2022-02-02 DIAGNOSIS — Z171 Estrogen receptor negative status [ER-]: Secondary | ICD-10-CM | POA: Diagnosis not present

## 2022-02-02 DIAGNOSIS — C50911 Malignant neoplasm of unspecified site of right female breast: Secondary | ICD-10-CM | POA: Diagnosis not present

## 2022-02-06 ENCOUNTER — Ambulatory Visit
Admission: RE | Admit: 2022-02-06 | Discharge: 2022-02-06 | Disposition: A | Discharge status: Home / Self Care | Payer: PPO | Source: Ambulatory Visit | Attending: Radiation Oncology | Admitting: Radiation Oncology

## 2022-02-06 ENCOUNTER — Other Ambulatory Visit: Payer: Self-pay

## 2022-02-06 DIAGNOSIS — Z510 Encounter for antineoplastic radiation therapy: Secondary | ICD-10-CM | POA: Diagnosis not present

## 2022-02-06 LAB — RAD ONC ARIA SESSION SUMMARY
Course Elapsed Days: 0
Plan Fractions Treated to Date: 1
Plan Prescribed Dose Per Fraction: 3 Gy
Plan Total Fractions Prescribed: 10
Plan Total Prescribed Dose: 30 Gy
Reference Point Dosage Given to Date: 3 Gy
Reference Point Session Dosage Given: 3 Gy
Session Number: 1

## 2022-02-06 MED FILL — Dexamethasone Sodium Phosphate Inj 100 MG/10ML: INTRAMUSCULAR | Qty: 1 | Status: AC

## 2022-02-06 MED FILL — Fosaprepitant Dimeglumine For IV Infusion 150 MG (Base Eq): INTRAVENOUS | Qty: 5 | Status: AC

## 2022-02-07 ENCOUNTER — Inpatient Hospital Stay (HOSPITAL_BASED_OUTPATIENT_CLINIC_OR_DEPARTMENT_OTHER): Payer: PPO | Admitting: Hospice and Palliative Medicine

## 2022-02-07 ENCOUNTER — Ambulatory Visit
Admission: RE | Admit: 2022-02-07 | Discharge: 2022-02-07 | Disposition: A | Discharge status: Home / Self Care | Payer: PPO | Source: Ambulatory Visit | Attending: Radiation Oncology | Admitting: Radiation Oncology

## 2022-02-07 ENCOUNTER — Other Ambulatory Visit: Payer: Self-pay

## 2022-02-07 ENCOUNTER — Inpatient Hospital Stay: Payer: PPO

## 2022-02-07 ENCOUNTER — Inpatient Hospital Stay (HOSPITAL_BASED_OUTPATIENT_CLINIC_OR_DEPARTMENT_OTHER): Payer: PPO | Admitting: Internal Medicine

## 2022-02-07 ENCOUNTER — Encounter: Payer: Self-pay | Admitting: Internal Medicine

## 2022-02-07 VITALS — BP 109/68 | HR 122 | Temp 96.7°F | Resp 19 | Wt 168.0 lb

## 2022-02-07 DIAGNOSIS — C7951 Secondary malignant neoplasm of bone: Secondary | ICD-10-CM | POA: Diagnosis not present

## 2022-02-07 DIAGNOSIS — R112 Nausea with vomiting, unspecified: Secondary | ICD-10-CM

## 2022-02-07 DIAGNOSIS — D4861 Neoplasm of uncertain behavior of right breast: Secondary | ICD-10-CM

## 2022-02-07 DIAGNOSIS — G893 Neoplasm related pain (acute) (chronic): Secondary | ICD-10-CM

## 2022-02-07 DIAGNOSIS — Z5112 Encounter for antineoplastic immunotherapy: Secondary | ICD-10-CM

## 2022-02-07 DIAGNOSIS — D6481 Anemia due to antineoplastic chemotherapy: Secondary | ICD-10-CM

## 2022-02-07 DIAGNOSIS — Z515 Encounter for palliative care: Secondary | ICD-10-CM | POA: Diagnosis not present

## 2022-02-07 DIAGNOSIS — C50911 Malignant neoplasm of unspecified site of right female breast: Secondary | ICD-10-CM

## 2022-02-07 DIAGNOSIS — Z510 Encounter for antineoplastic radiation therapy: Secondary | ICD-10-CM | POA: Diagnosis not present

## 2022-02-07 LAB — CBC WITH DIFFERENTIAL/PLATELET
Abs Immature Granulocytes: 0.45 10*3/uL — ABNORMAL HIGH (ref 0.00–0.07)
Basophils Absolute: 0 10*3/uL (ref 0.0–0.1)
Basophils Relative: 1 %
Eosinophils Absolute: 0 10*3/uL (ref 0.0–0.5)
Eosinophils Relative: 0 %
HCT: 29.8 % — ABNORMAL LOW (ref 36.0–46.0)
Hemoglobin: 9.5 g/dL — ABNORMAL LOW (ref 12.0–15.0)
Immature Granulocytes: 7 %
Lymphocytes Relative: 20 %
Lymphs Abs: 1.4 10*3/uL (ref 0.7–4.0)
MCH: 27.4 pg (ref 26.0–34.0)
MCHC: 31.9 g/dL (ref 30.0–36.0)
MCV: 85.9 fL (ref 80.0–100.0)
Monocytes Absolute: 0.8 10*3/uL (ref 0.1–1.0)
Monocytes Relative: 12 %
Neutro Abs: 4.1 10*3/uL (ref 1.7–7.7)
Neutrophils Relative %: 60 %
Platelets: 104 10*3/uL — ABNORMAL LOW (ref 150–400)
RBC: 3.47 MIL/uL — ABNORMAL LOW (ref 3.87–5.11)
RDW: 21.1 % — ABNORMAL HIGH (ref 11.5–15.5)
Smear Review: NORMAL
WBC: 6.8 10*3/uL (ref 4.0–10.5)
nRBC: 4.1 % — ABNORMAL HIGH (ref 0.0–0.2)

## 2022-02-07 LAB — RAD ONC ARIA SESSION SUMMARY
Course Elapsed Days: 1
Plan Fractions Treated to Date: 2
Plan Prescribed Dose Per Fraction: 3 Gy
Plan Total Fractions Prescribed: 10
Plan Total Prescribed Dose: 30 Gy
Reference Point Dosage Given to Date: 6 Gy
Reference Point Session Dosage Given: 3 Gy
Session Number: 2

## 2022-02-07 LAB — COMPREHENSIVE METABOLIC PANEL
ALT: 25 U/L (ref 0–44)
AST: 76 U/L — ABNORMAL HIGH (ref 15–41)
Albumin: 2.3 g/dL — ABNORMAL LOW (ref 3.5–5.0)
Alkaline Phosphatase: 550 U/L — ABNORMAL HIGH (ref 38–126)
Anion gap: 13 (ref 5–15)
BUN: 12 mg/dL (ref 8–23)
CO2: 18 mmol/L — ABNORMAL LOW (ref 22–32)
Calcium: 8.4 mg/dL — ABNORMAL LOW (ref 8.9–10.3)
Chloride: 101 mmol/L (ref 98–111)
Creatinine, Ser: 0.66 mg/dL (ref 0.44–1.00)
GFR, Estimated: >60 mL/min (ref >60)
Glucose, Bld: 182 mg/dL — ABNORMAL HIGH (ref 70–99)
Potassium: 4.1 mmol/L (ref 3.5–5.1)
Sodium: 132 mmol/L — ABNORMAL LOW (ref 135–145)
Total Bilirubin: 0.7 mg/dL (ref 0.3–1.2)
Total Protein: 6.9 g/dL (ref 6.5–8.1)

## 2022-02-07 LAB — MAGNESIUM: Magnesium: 1.7 mg/dL (ref 1.7–2.4)

## 2022-02-07 MED ORDER — DIPHENHYDRAMINE HCL 50 MG/ML IJ SOLN
50.0000 mg | Freq: Once | INTRAMUSCULAR | Status: AC
  Administered 2022-02-07: 50 mg via INTRAVENOUS
  Filled 2022-02-07: qty 1

## 2022-02-07 MED ORDER — HYDROCODONE-ACETAMINOPHEN 10-325 MG PO TABS: 1.0000 | ORAL_TABLET | ORAL | 0 refills | Status: DC | PRN

## 2022-02-07 MED ORDER — ACETAMINOPHEN 325 MG PO TABS
650.0000 mg | ORAL_TABLET | Freq: Once | ORAL | Status: AC
  Administered 2022-02-07: 650 mg via ORAL
  Filled 2022-02-07: qty 2

## 2022-02-07 MED ORDER — SODIUM CHLORIDE 0.9 % IV SOLN
10.0000 mg | Freq: Once | INTRAVENOUS | Status: AC
  Administered 2022-02-07: 10 mg via INTRAVENOUS
  Filled 2022-02-07: qty 1
  Filled 2022-02-07: qty 10

## 2022-02-07 MED ORDER — SODIUM CHLORIDE 0.9 % IV SOLN
7.3000 mg/kg | Freq: Once | INTRAVENOUS | Status: AC
  Administered 2022-02-07: 620 mg via INTRAVENOUS
  Filled 2022-02-07: qty 62

## 2022-02-07 MED ORDER — FAMOTIDINE IN NACL 20-0.9 MG/50ML-% IV SOLN
20.0000 mg | Freq: Once | INTRAVENOUS | Status: AC
  Administered 2022-02-07: 20 mg via INTRAVENOUS
  Filled 2022-02-07: qty 50

## 2022-02-07 MED ORDER — SODIUM CHLORIDE 0.9 % IV SOLN
Freq: Once | INTRAVENOUS | Status: AC
  Filled 2022-02-07: qty 250

## 2022-02-07 MED ORDER — ATROPINE SULFATE 1 MG/ML IV SOLN
0.5000 mg | Freq: Once | INTRAVENOUS | Status: AC | PRN
  Administered 2022-02-07: 0.5 mg via INTRAVENOUS
  Filled 2022-02-07: qty 1

## 2022-02-07 MED ORDER — MORPHINE SULFATE ER 15 MG PO TBCR: 15.0000 mg | EXTENDED_RELEASE_TABLET | Freq: Two times a day (BID) | ORAL | 0 refills | Status: DC

## 2022-02-07 MED ORDER — PALONOSETRON HCL INJECTION 0.25 MG/5ML
0.2500 mg | Freq: Once | INTRAVENOUS | Status: AC
  Administered 2022-02-07: 0.25 mg via INTRAVENOUS
  Filled 2022-02-07: qty 5

## 2022-02-07 MED ORDER — MORPHINE SULFATE (PF) 2 MG/ML IV SOLN
2.0000 mg | Freq: Once | INTRAVENOUS | Status: AC
  Administered 2022-02-07: 2 mg via INTRAVENOUS
  Filled 2022-02-07: qty 1

## 2022-02-07 MED ORDER — SODIUM CHLORIDE 0.9 % IV SOLN
150.0000 mg | Freq: Once | INTRAVENOUS | Status: AC
  Administered 2022-02-07: 150 mg via INTRAVENOUS
  Filled 2022-02-07: qty 150
  Filled 2022-02-07: qty 5

## 2022-02-07 MED ORDER — HEPARIN SOD (PORK) LOCK FLUSH 100 UNIT/ML IV SOLN
500.0000 [IU] | Freq: Once | INTRAVENOUS | Status: AC | PRN
  Administered 2022-02-07: 500 [IU]
  Filled 2022-02-07: qty 5

## 2022-02-07 MED ORDER — MORPHINE SULFATE (PF) 2 MG/ML IV SOLN: 2.0000 mg | Freq: Once | INTRAVENOUS | Status: DC

## 2022-02-07 NOTE — Progress Notes (Signed)
Patient with >10% weight loss.  New dose Trodelvy is 620 mg (8 mg/kg) with new weight 76.2 kg  Henreitta Leber, PharmD

## 2022-02-07 NOTE — Progress Notes (Unsigned)
Patient states she was having right hip pain all night. Patient states she is having trouble breathing. (Feeling tight) Patient states she has had nausea and vomiting for the last two weeks. Patient states she is currently having some chin numbness.

## 2022-02-07 NOTE — Patient Instructions (Signed)
[facility]  Discharge Instructions: Thank you for choosing [facility] to provide your oncology and hematology care.  If you have a lab appointment with the Cancer Center, please go directly to the Cancer Center and check in at the registration area.  Wear comfortable clothing and clothing appropriate for easy access to any Portacath or PICC line.   We strive to give you quality time with your provider. You may need to reschedule your appointment if you arrive late (15 or more minutes).  Arriving late affects you and other patients whose appointments are after yours.  Also, if you miss three or more appointments without notifying the office, you may be dismissed from the clinic at the provider's discretion.      For prescription refill requests, have your pharmacy contact our office and allow 72 hours for refills to be completed.       To help prevent nausea and vomiting after your treatment, we encourage you to take your nausea medication as directed.  BELOW ARE SYMPTOMS THAT SHOULD BE REPORTED IMMEDIATELY: *FEVER GREATER THAN 100.4 F (38 C) OR HIGHER *CHILLS OR SWEATING *NAUSEA AND VOMITING THAT IS NOT CONTROLLED WITH YOUR NAUSEA MEDICATION *UNUSUAL SHORTNESS OF BREATH *UNUSUAL BRUISING OR BLEEDING *URINARY PROBLEMS (pain or burning when urinating, or frequent urination) *BOWEL PROBLEMS (unusual diarrhea, constipation, pain near the anus) TENDERNESS IN MOUTH AND THROAT WITH OR WITHOUT PRESENCE OF ULCERS (sore throat, sores in mouth, or a toothache) UNUSUAL RASH, SWELLING OR PAIN  UNUSUAL VAGINAL DISCHARGE OR ITCHING   Items with * indicate a potential emergency and should be followed up as soon as possible or go to the Emergency Department if any problems should occur.  Please show the CHEMOTHERAPY ALERT CARD or IMMUNOTHERAPY ALERT CARD at check-in to the Emergency Department and triage nurse.  Should you have questions after your  visit or need to cancel or reschedule your appointment, please contact [facility]  [phone] and follow the prompts.  Office hours are 8:00 a.m. to 4:30 p.m. Monday - Friday. Please note that voicemails left after 4:00 p.m. may not be returned until the following business day.  We are closed weekends and major holidays. You have access to a nurse at all times for urgent questions. Please call the main number to the clinic [phone] and follow the prompts.  For any non-urgent questions, you may also contact your provider using MyChart. We now offer e-Visits for anyone 18 and older to request care online for non-urgent symptoms. For details visit [email or web address].   Also download the MyChart app! Go to the app store, search "MyChart", open the app, select [facility], and log in with your MyChart username and password.  Masks are optional in the cancer centers. If you would like for your care team to wear a mask while they are taking care of you, please let them know. For doctor visits, patients may have with them one support person who is at least 69 years old. At this time, visitors are not allowed in the infusion area.   

## 2022-02-07 NOTE — Progress Notes (Signed)
[place] at [facility] [phone] [phone]   Name: Ashlee Cox Date: 02/07/2022 MRN: [id number]  DOB: [birth year]  Patient Care Team: [name], [name] Noreene Filbert, MD as Referring Physician (Radiation Oncology) Cammie Sickle, MD as Consulting Physician (Internal Medicine) Bary Castilla Forest Gleason, MD as Consulting Physician (General Surgery) Jeral Fruit, RN as Registered Nurse Dannielle Karvonen, RN as [place] Management    REASON FOR CONSULTATION: Ashlee Cox is a 71 y.o. female with multiple medical problems including triple negative right breast cancer, initially diagnosed in July 2021 at stage IIIb with recurrence and progression of stage IV in January 2020 with bony metastatic disease.  Patient is status post chemotherapy and immunotherapy, currently on hold due to poor tolerance.  She is also receiving XRT.  Patient has had persistent pain and palliative care was consulted to help address goals and manage ongoing symptoms.  SOCIAL HISTORY:     reports that she quit smoking about 16 years ago. Her smoking use included cigarettes. She has never used smokeless tobacco. She reports that she does not drink alcohol and does not use drugs.  Patient is married and lives at home with her husband.  ADVANCE DIRECTIVES:  None on file  CODE STATUS:   PAST MEDICAL HISTORY: Past Medical History:  Diagnosis Date   Cancer ([place]) 2021   right breast   Cyst, breast    benign   Diabetes mellitus without complication ([place])    Family history of melanoma    Family history of ovarian cancer    GERD (gastroesophageal reflux disease)    H/O: rheumatic fever    History of colonoscopy 2010   done bc of bleeding,  normal , due back in 5 yrs ([place])   Hyperlipidemia    Hypertension    Menopause    at age 47   Personal history of  chemotherapy 09/2019   RIGHT  INVASIVE MAMMARY CARCINOMA   Personal history of radiation therapy     PAST SURGICAL HISTORY:  Past Surgical History:  Procedure Laterality Date   APPENDECTOMY  2006   BREAST BIOPSY Right 09/22/2019   INVASIVE MAMMARY CARCINOMA   BREAST CYST ASPIRATION Left 1999   BREAST LUMPECTOMY Left 04/23/2020   surgery with NL and SN    BREAST LUMPECTOMY WITH NEEDLE LOCALIZATION Right 04/23/2020   Procedure: BREAST LUMPECTOMY WITH NEEDLE LOCALIZATION;  Surgeon: Robert Bellow, MD;  Location: [facility] ORS;  Service: General;  Laterality: Right;   BREAST LUMPECTOMY WITH SENTINEL LYMPH NODE BIOPSY Right 04/23/2020   Procedure: BREAST LUMPECTOMY WITH SENTINEL LYMPH NODE BX;  Surgeon: Robert Bellow, MD;  Location: [facility] ORS;  Service: General;  Laterality: Right;   CHOLECYSTECTOMY  2006   COLONOSCOPY     PORTACATH PLACEMENT Left 10/06/2019   Procedure: INSERTION PORT-A-CATH;  Surgeon: Robert Bellow, MD;  Location: [facility] ORS;  Service: General;  Laterality: Left;   SUBMUCOSAL TATTOO INJECTION Right 10/06/2019   Procedure: Right axillary TATTOO INJECTION;  Surgeon: Robert Bellow, MD;  Location: [facility] ORS;  Service: General;  Laterality: Right;   VAGINAL DELIVERY     x3    HEMATOLOGY/ONCOLOGY HISTORY:  Oncology History Overview Note  #June 2021- Right breast cancer-T2N1; 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 Byrnett.] [id number] Carbo-taxol-AC;  JAN 2022-s/p Lumpectomy & ALND- [ypT1a (68m ) ypN1 (2/11 LN-positive)]; s/p radiation end of April 2022.  #Early June 2022-Xeloda 1000  mg per metered square [2000] 2 weeks on 1 week of 6-8 cycles; July 11th 2022- STARTING cycle #3-cut down the dose to 1500 mg BID[sec to HFS].   # JAN 2023- A. SKIN, RIGHT BREAST; PUNCH BIOPSY: [Dr.Byrnett] - INVASIVE CARCINOMA WITH DERMAL INFILTRATION AND LYMPHATIC INVASION. ER- weak POSISTIVE [10%]; PR/her2 NEG [facility]- 0]; JAN 20th, 2023- IMPRESSION: 1. Although the hypermetabolic right axillary  node has resolved since the prior PET, there is a new hypermetabolic right low cervical/supraclavicular node, suspicious for nodal metastasis. 2. Decrease in right breast hypermetabolism with nonspecific overlying skin thickening in the setting of prior lumpectomy. 3. Low-level hypermetabolism within the T2 vertebral body, suspicious for isolated osseous metastasis. This could be confirmed with pre and postcontrast thoracic spine MRI. 4. Hypermetabolic thoracic nodes, slightly progressive. Given progression, nodal metastasis are slightly favored over reactive etiologies. 5. Incidental findings, including: Aortic Atherosclerosis (ICD10-I70.0). Tiny hiatal hernia. Hepatic steatosis with nonspecific caudate lobe enlargemen  Comment:  The carcinoma is morphologically similar to the post-neoadjuvant mammary  carcinoma seen in the January 2022 right breast wide excision and lymph  Nodes  # FEB 6th-Taxol single agent #1; FEB 13th, 2023- [CPS =15]; Taxol-Keytruda  MAY 2023- CT CHEST:  Since the PET of 04/15/2021, enlargement of the previously described hypermetabolic right low cervical/supraclavicular nodal metastasis. Foci of hyperenhancement within the right breast are new or more well-defined since 02/02/2021, suspicious for progressive residual/recurrent disease. Nonspecific mediastinal nodes, similar to slightly decreased in size;  New sclerosis at the site of a known T2 vertebral body metastasis.   # s/p palliative radiation therapy to her right supraclavicular nodal metastasis as well as T2 vertebral body [30 Gray in 10 fractions ;[5/31 to  6/13].  CONTINUED TAXOL-KEYTRUDA; SEP 6th 2023-bone scan progressive disease.  Discontinue Taxol-Keytruda  #  SEP 19th 2023- SACITUZUMAB GOVITECAN [at 80% dose]   DIAGNOSIS: Right breast cancer triple negative      Carcinoma of upper-inner quadrant of right breast in female, estrogen receptor negative ([place])  10/07/2019 Initial Diagnosis    Carcinoma of upper-inner quadrant of right breast in female, estrogen receptor negative ([place])   12/04/2019 Genetic Testing   Negative genetic testing. No pathogenic variants identified on the [facility] Common Hereditary Cancers Panel + Skin Cancers Panel. The report date is 12/04/2019.  The Common Hereditary Cancers Panel + Skin Cancers Panel offered by [facility] includes sequencing and/or deletion duplication testing of the following 54 genes: APC*, ATM*, AXIN2, BAP1, BARD1, BMPR1A, BRCA1, BRCA2, BRIP1, CDH1, CDK4, CDKN2A (p14ARF), CDKN2A (p16INK4a), CHEK2, CTNNA1, DICER1*, EPCAM*, GREM1*, HOXB13, KIT, MEN1*, MITF*, MLH1*, MSH2*, MSH3*, MSH6*, MUTYH, NBN, NF1*, NTHL1, PALB2, PDGFRA, PMS2*, POLD1*, POLE, POT1, PTCH1, PTEN*, RAD50, RAD51C, RAD51D, RB1*, RNF43, SDHA*, SDHB, SDHC*, SDHD, SMAD4, SMARCA4, STK11, SUFU, TP53, TSC1*, TSC2, VHL.    05/07/2020 Cancer Staging   Staging form: Breast, AJCC 8th Edition - Clinical: Stage IIIB (cT2, cN1, cM0, G3, ER-, PR-, HER2-) - Signed by Cammie Sickle, MD on 05/07/2020   04/18/2021 Cancer Staging   Staging form: Breast, AJCC 8th Edition - Pathologic: Stage IV (pT4d, pN3c, cM1, ER+, PR-, HER2-) - Signed by Cammie Sickle, MD on 04/18/2021 Nuclear grade: G3     ALLERGIES:  has No Known Allergies.  MEDICATIONS:  Current Outpatient Medications  Medication Sig Dispense Refill   calcium carbonate (OS-CAL) 600 MG TABS tablet Take 600 mg by mouth daily.     Continuous Blood Gluc Sensor (FREESTYLE LIBRE 3 SENSOR) MISC Apply 1 each topically every 14 (fourteen) days. Place  1 sensor on the skin every 14 days. Use to check glucose continuously 6 each 3   dicyclomine (BENTYL) 20 MG tablet Take 1 tablet (20 mg total) by mouth every 8 (eight) hours as needed for spasms. 30 tablet 0   diphenoxylate-atropine (LOMOTIL) 2.5-0.025 MG tablet Take 1 tablet by mouth 4 (four) times daily as needed for diarrhea or loose stools. Take it along with immodium 60 tablet 0    fentaNYL (DURAGESIC) 50 MCG/HR Place 1 patch onto the skin every 3 (three) days. 10 patch 0   gabapentin (NEURONTIN) 100 MG capsule TAKE 2 CAPSULES BY MOUTH AT BEDTIME 180 capsule 0   glipiZIDE (GLUCOTROL XL) 10 MG 24 hr tablet Take 1 tablet by mouth once daily with breakfast 90 tablet 0   HYDROcodone-acetaminophen (NORCO/VICODIN) 5-325 MG tablet Take 1-2 tablets by mouth every 4 (four) hours as needed for moderate pain. 60 tablet 0   KLOR-CON M20 20 MEQ tablet Take 1 tablet by mouth twice daily 60 tablet 0   levothyroxine (SYNTHROID) 100 MCG tablet TAKE 1 TABLET BY MOUTH ONCE DAILY IN THE MORNING BEFORE BREAKFAST 90 tablet 0   lidocaine-prilocaine (EMLA) cream Apply 1 application topically as needed. Apply to port and cover with saran wrap 1-2 hours prior to port access 30 g 1   losartan-hydrochlorothiazide (HYZAAR) 50-12.5 MG tablet Take 1 tablet by mouth once daily 90 tablet 0   lovastatin (MEVACOR) 40 MG tablet TAKE 1 TABLET BY MOUTH AT BEDTIME 90 tablet 1   Magnesium Cl-Calcium Carbonate (SLOW MAGNESIUM/CALCIUM) 70-117 MG TBEC Take 1 tablet by mouth 2 (two) times daily. 60 tablet 6   metFORMIN (GLUCOPHAGE) 1000 MG tablet TAKE 2 TABLETS BY MOUTH ONCE DAILY WITH BREAKFAST 180 tablet 0   Multiple Vitamin (MULTIVITAMIN) tablet Take 1 tablet by mouth daily.     omeprazole (PRILOSEC) 20 MG capsule Take 1 capsule (20 mg total) by mouth daily. 90 capsule 3   ondansetron (ZOFRAN) 8 MG tablet One pill every 8 hours as needed for nausea/vomitting. 40 tablet 1   prochlorperazine (COMPAZINE) 10 MG tablet Take 1 tablet (10 mg total) by mouth every 6 (six) hours as needed for nausea or vomiting. 40 tablet 1   Semaglutide,0.25 or 0.5MG/DOS, (OZEMPIC, 0.25 OR 0.5 MG/DOSE,) 2 MG/1.5ML SOPN Inject 0.5 mg into the skin once a week. 1.5 mL 11   vitamin B-12 (CYANOCOBALAMIN) 100 MCG tablet Take 100 mcg by mouth daily.     No current facility-administered medications for this visit.    VITAL SIGNS: There were no  vitals taken for this visit. There were no vitals filed for this visit.  Estimated body mass index is 26.31 kg/m as calculated from the following:   Height as of 01/03/22: _0  (1.702 m).   Weight as of an earlier encounter on 02/07/22: 168 lb (76.2 kg).  LABS: CBC:    Component Value Date/Time   WBC 6.8 02/07/2022 0829   HGB 9.5 (L) 02/07/2022 0829   HCT 29.8 (L) 02/07/2022 0829   PLT 104 (L) 02/07/2022 0829   MCV 85.9 02/07/2022 0829   NEUTROABS PENDING 02/07/2022 0829   LYMPHSABS PENDING 02/07/2022 0829   MONOABS PENDING 02/07/2022 0829   EOSABS PENDING 02/07/2022 0829   BASOSABS PENDING 02/07/2022 0829   Comprehensive Metabolic Panel:    Component Value Date/Time   NA 132 (L) 02/07/2022 0829   K 4.1 02/07/2022 0829   CL 101 02/07/2022 0829   CO2 18 (L) 02/07/2022 0829   BUN 12  02/07/2022 0829   CREATININE 0.66 02/07/2022 0829   GLUCOSE 182 (H) 02/07/2022 0829   CALCIUM 8.4 (L) 02/07/2022 0829   AST 76 (H) 02/07/2022 0829   ALT 25 02/07/2022 0829   ALKPHOS 550 (H) 02/07/2022 0829   BILITOT 0.7 02/07/2022 0829   PROT 6.9 02/07/2022 0829   ALBUMIN 2.3 (L) 02/07/2022 0829    RADIOGRAPHIC STUDIES: No results found.  PERFORMANCE STATUS (ECOG) : 2 - Symptomatic, <50% confined to bed  Review of Systems Unless otherwise noted, a complete review of systems is negative.  Physical Exam General: NAD Cardiovascular: regular rate and rhythm Pulmonary: clear ant fields Abdomen: soft, nontender, + bowel sounds GU: no suprapubic tenderness Extremities: no edema, no joint deformities Skin: no rashes Neurological: Weakness but otherwise nonfocal  IMPRESSION: Patient was an add-on to my schedule at request of Dr. Darrall Dears who was seeing patient for Dr. Rogue Bussing.  Patient has not found significant relief with increased dose of transdermal fentanyl and Norco.  She is to continue to take two Norco tablets every 4 hours around-the-clock and still reports persistent and  severe pain.  Pain is mostly in the legs and hips/bones and does interfere with her quality of life.  Patient's weight has been downtrending over the past couple of months and I suspect that the loss of subcutaneous fat might be interfering with efficacy of fentanyl.  We will rotate to MS Contin and increased dose of Norco tablets to reduce pill burden.  Patient has been in agreement with this plan.  Discussed with Dr. Darrall Dears.   Patient has had occasional shortness of breath.  She might benefit from repeat imaging but has follow-up scheduled in 2 weeks to see Dr. Rogue Bussing.  PLAN: -Continue current scope of treatment -Discontinue fentanyl -Start MS Contin 15 mg every 12 hours -Increase Norco 10-355m every 4 hours as needed for breakthrough pain -Daily bowel regimen -Follow-up in 1 week   Patient expressed understanding and was in agreement with this plan. She also understands that She can call the clinic at any time with any questions, concerns, or complaints.     Time Total: 15 minutes  Visit consisted of counseling and education dealing with the complex and emotionally intense issues of symptom management and palliative care in the setting of serious and potentially life-threatening illness.Greater than 50%  of this time was spent counseling and coordinating care related to the above assessment and plan.  Signed by: JAltha Harm PhD, NP-C

## 2022-02-07 NOTE — Progress Notes (Signed)
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 Byrnett.] [id number] Carbo-taxol-AC;  JAN 2022-s/p Lumpectomy & ALND- [ypT1a (65m ) ypN1 (2/11 LN-positive)]; s/p radiation end of April 2022.  #Early June 2022-Xeloda 1000 mg per metered square [2000] 2 weeks on 1 week of 6-8 cycles; July 11th 2022- STARTING cycle #3-cut down the dose to 1500 mg BID[sec to HFS].   # JAN 2023- A. SKIN, RIGHT BREAST; PUNCH BIOPSY: [Dr.Byrnett] - INVASIVE CARCINOMA WITH DERMAL INFILTRATION AND LYMPHATIC INVASION. ER- weak POSISTIVE [10%]; PR/her2 NEG [[facility] 0]; JAN 20th, 2023- IMPRESSION: 1. Although the hypermetabolic right axillary node has resolved since the prior PET, there is a new hypermetabolic right low cervical/supraclavicular node, suspicious for nodal metastasis. 2. Decrease in right breast hypermetabolism with nonspecific overlying skin thickening in the setting of prior lumpectomy. 3. Low-level hypermetabolism within the T2 vertebral body, suspicious for isolated osseous metastasis. This could be confirmed with pre and postcontrast thoracic spine MRI. 4. Hypermetabolic thoracic nodes, slightly progressive. Given progression, nodal metastasis are slightly favored over reactive etiologies. 5. Incidental findings, including: Aortic Atherosclerosis (ICD10-I70.0). Tiny hiatal hernia. Hepatic steatosis with nonspecific caudate lobe enlargemen  Comment:  The carcinoma is  morphologically similar to the post-neoadjuvant mammary  carcinoma seen in the January 2022 right breast wide excision and lymph  Nodes  # FEB 6th-Taxol single agent #1; FEB 13th, 2023- [CPS =15]; Taxol-Keytruda  MAY 2023- CT CHEST:  Since the PET of 04/15/2021, enlargement of the previously described hypermetabolic right low cervical/supraclavicular nodal metastasis. Foci of hyperenhancement within the right breast are new or more well-defined since 02/02/2021, suspicious for progressive residual/recurrent disease. Nonspecific mediastinal nodes, similar to slightly decreased in size;  New sclerosis at the site of a known T2 vertebral body metastasis.   # s/p palliative radiation therapy to her right supraclavicular nodal metastasis as well as T2 vertebral body [30 Gray in 10 fractions ;[5/31 to  6/13].  CONTINUED TAXOL-KEYTRUDA; SEP 6th 2023-bone scan progressive disease.  Discontinue Taxol-Keytruda  #  SEP 19th 2023- SACITUZUMAB GOVITECAN [at 80% dose]   DIAGNOSIS: Right breast cancer triple negative      Carcinoma of upper-inner quadrant of right breast in female, estrogen receptor negative ([place]  10/07/2019 Initial Diagnosis   Carcinoma of upper-inner quadrant of right breast in female, estrogen receptor negative ([place]   12/04/2019 Genetic Testing   Negative genetic testing. No pathogenic variants identified on the [facility] Common Hereditary Cancers Panel + Skin Cancers Panel. The report date is 12/04/2019.  The Common Hereditary Cancers Panel + Skin Cancers Panel offered by [facility] includes sequencing and/or deletion duplication testing of the following 54 genes: APC*, ATM*, AXIN2, BAP1, BARD1, BMPR1A, BRCA1, BRCA2, BRIP1, CDH1, CDK4, CDKN2A (p14ARF), CDKN2A (p16INK4a), CHEK2, CTNNA1, DICER1*, EPCAM*, GREM1*, HOXB13, KIT, MEN1*, MITF*, MLH1*, MSH2*, MSH3*, MSH6*, MUTYH, NBN, NF1*, NTHL1, PALB2, PDGFRA, PMS2*, POLD1*, POLE, POT1, PTCH1, PTEN*, RAD50, RAD51C, RAD51D, RB1*, RNF43, SDHA*, SDHB,  SDHC*, SDHD, SMAD4, SMARCA4, STK11, SUFU, TP53, TSC1*, TSC2, VHL.    05/07/2020 Cancer Staging   Staging form: Breast, AJCC 8th Edition -  Clinical: Stage IIIB (cT2, cN1, cM0, G3, ER-, PR-, HER2-) - Signed by Cammie Sickle, MD on 05/07/2020   04/18/2021 Cancer Staging   Staging form: Breast, AJCC 8th Edition - Pathologic: Stage IV (pT4d, pN3c, cM1, ER+, PR-, HER2-) - Signed by Cammie Sickle, MD on 04/18/2021 Nuclear grade: G3      HISTORY OF PRESENTING ILLNESS: Accompanied by husband.  Walking independently.  Ashlee Cox 71 y.o.  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  Patient has been feeling unwell.  She continues to have nausea and vomiting.  Diarrhea has improved.  Her appetite is poor.  Energy levels are low. Complain of pain in right hip.  Started radiation yesterday.  She is taking Norco 2 tablets every 4 hours with fentanyl patch but does not think it is helping her much. Reports no improvement with holding treatment.  Review of Systems  Constitutional:  Positive for malaise/fatigue. Negative for chills, diaphoresis, fever and weight loss.  HENT:  Negative for nosebleeds and sore throat.   Eyes:  Negative for double vision.  Respiratory:  Negative for cough, hemoptysis, sputum production and wheezing.   Cardiovascular:  Negative for chest pain, palpitations, orthopnea and leg swelling.  Gastrointestinal:  Positive for nausea and vomiting. Negative for abdominal pain, blood in stool, constipation and melena.  Genitourinary:  Negative for dysuria, frequency and urgency.  Musculoskeletal:  Positive for back pain and neck pain. Negative for joint pain.  Neurological:  Positive for tingling. Negative for dizziness, focal weakness, weakness and headaches.  Endo/Heme/Allergies:  Does not bruise/bleed easily.  Psychiatric/Behavioral:  Negative for depression. The  patient is not nervous/anxious and does not have insomnia.      MEDICAL HISTORY:  Past Medical History:  Diagnosis Date  . Cancer [facility]) 2021   right breast  . Cyst, breast    benign  . Diabetes mellitus without complication ([place])   . Family history of melanoma   . Family history of ovarian cancer   . GERD (gastroesophageal reflux disease)   . H/O: rheumatic fever   . 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 Hyperlipidemia   . Hypertension   . Menopause    at age 39  . Personal history of chemotherapy 09/2019   RIGHT  INVASIVE MAMMARY CARCINOMA  . Personal history of radiation therapy     SURGICAL HISTORY: Past Surgical History:  Procedure Laterality Date  . APPENDECTOMY  2006  . BREAST BIOPSY Right 09/22/2019   INVASIVE MAMMARY CARCINOMA  . BREAST CYST ASPIRATION Left 1999  . BREAST LUMPECTOMY Left 04/23/2020   surgery with NL and SN   . BREAST LUMPECTOMY WITH NEEDLE LOCALIZATION Right 04/23/2020   Procedure: BREAST LUMPECTOMY WITH NEEDLE LOCALIZATION;  Surgeon: Robert Bellow, MD;  Location: [facility] ORS;  Service: General;  Laterality: Right;  . BREAST LUMPECTOMY WITH SENTINEL LYMPH NODE BIOPSY Right 04/23/2020   Procedure: BREAST LUMPECTOMY WITH SENTINEL LYMPH NODE BX;  Surgeon: Robert Bellow, MD;  Location: [facility] ORS;  Service: General;  Laterality: Right;  . CHOLECYSTECTOMY  2006  . COLONOSCOPY    . PORTACATH PLACEMENT Left 10/06/2019   Procedure: INSERTION PORT-A-CATH;  Surgeon: Robert Bellow, MD;  Location: [facility] ORS;  Service: General;  Laterality: Left;  . SUBMUCOSAL TATTOO INJECTION Right 10/06/2019   Procedure: Right axillary TATTOO INJECTION;  Surgeon: Robert Bellow, MD;  Location: [facility] ORS;  Service: General;  Laterality: Right;  Marland Kitchen VAGINAL DELIVERY     x3    SOCIAL HISTORY: Social History   Socioeconomic History  . Marital status: Married    Spouse name: Chrissie Noa  . Number of children: 3   . Years of education: Not on file  . Highest education level: Not on file  Occupational History  . Occupation: Glass blower/designer: [id number]  Tobacco Use  . Smoking status: Former    Years: 1.00    Types: Cigarettes    Quit date: 12/26/2005    Years since quitting: 16.1  . Smokeless tobacco: Never  . Tobacco comments:    smoked for less than 1 years,  1 cig/day  Vaping Use  . Vaping Use: Never used  Substance and Sexual Activity  . Alcohol use: No  . Drug use: No  . Sexual activity: Never    Partners: Female  Other Topics Concern  . Not on file  Social History Narrative   Widowed, March 2014; remarried.      [employer]; quit smoking 1ppw; no alcohol.       Social Determinants of Health   Financial Resource Strain: Medium Risk (05/25/2021)   Overall Financial Resource Strain (CARDIA)   . Difficulty of Paying Living Expenses: Somewhat hard  Food Insecurity: No Food Insecurity (05/18/2021)   Hunger Vital Sign   . Worried About Charity fundraiser in the Last Year: Never true   . Ran Out of Food in the Last Year: Never true  Transportation Needs: No Transportation Needs (05/18/2021)   PRAPARE - Transportation   . Lack of Transportation (Medical): No   . Lack of Transportation (Non-Medical): No  Physical Activity: Inactive (05/18/2021)   Exercise Vital Sign   . Days of Exercise per Week: 0 days   . Minutes of Exercise per Session: 0 min  Stress: No Stress Concern Present (05/18/2021)   [place]   . Feeling of Stress : Not at all  Social Connections: Socially Integrated (05/18/2021)   Social Connection and Isolation Panel [NHANES]   . Frequency of Communication with Friends and Family: More than three times a week   . Frequency of Social Gatherings with Friends and Family: More than three times a week   . Attends Religious Services: More than 4 times per year   . Active Member of Clubs or Organizations: Yes    . Attends Archivist Meetings: More than 4 times per year   . Marital Status: Married  Human resources officer Violence: Not At Risk (05/04/2021)   Humiliation, Afraid, Rape, and Kick questionnaire   . Fear of Current or Ex-Partner: No   . Emotionally Abused: No   . Physically Abused: No   . Sexually Abused: No    FAMILY HISTORY: Family History  Problem Relation Age of Onset  . Cancer Mother 53       ovarian- died 4-5 years.   . Heart disease Mother 80  . Diabetes Mother   . Stroke Father 28  . Diabetes Father   . Diabetes Sister   . Melanoma Sister        survived  . Breast cancer Neg Hx     ALLERGIES:  has No Known Allergies.  MEDICATIONS:  Current Outpatient Medications  Medication Sig Dispense Refill  . calcium carbonate (OS-CAL) 600 MG TABS tablet Take 600 mg by mouth daily.    . Continuous  Blood Gluc Sensor (FREESTYLE LIBRE 3 SENSOR) MISC Apply 1 each topically every 14 (fourteen) days. Place 1 sensor on the skin every 14 days. Use to check glucose continuously 6 each 3  . dicyclomine (BENTYL) 20 MG tablet Take 1 tablet (20 mg total) by mouth every 8 (eight) hours as needed for spasms. 30 tablet 0  . diphenoxylate-atropine (LOMOTIL) 2.5-0.025 MG tablet Take 1 tablet by mouth 4 (four) times daily as needed for diarrhea or loose stools. Take it along with immodium 60 tablet 0  . gabapentin (NEURONTIN) 100 MG capsule TAKE 2 CAPSULES BY MOUTH AT BEDTIME 180 capsule 0  . glipiZIDE (GLUCOTROL XL) 10 MG 24 hr tablet Take 1 tablet by mouth once daily with breakfast 90 tablet 0  . KLOR-CON M20 20 MEQ tablet Take 1 tablet by mouth twice daily 60 tablet 0  . levothyroxine (SYNTHROID) 100 MCG tablet TAKE 1 TABLET BY MOUTH ONCE DAILY IN THE MORNING BEFORE BREAKFAST 90 tablet 0  . lidocaine-prilocaine (EMLA) cream Apply 1 application topically as needed. Apply to port and cover with saran wrap 1-2 hours prior to port access 30 g 1  . losartan-hydrochlorothiazide (HYZAAR) 50-12.5 MG  tablet Take 1 tablet by mouth once daily 90 tablet 0  . lovastatin (MEVACOR) 40 MG tablet TAKE 1 TABLET BY MOUTH AT BEDTIME 90 tablet 1  . Magnesium Cl-Calcium Carbonate (SLOW MAGNESIUM/CALCIUM) 70-117 MG TBEC Take 1 tablet by mouth 2 (two) times daily. 60 tablet 6  . metFORMIN (GLUCOPHAGE) 1000 MG tablet TAKE 2 TABLETS BY MOUTH ONCE DAILY WITH BREAKFAST 180 tablet 0  . Multiple Vitamin (MULTIVITAMIN) tablet Take 1 tablet by mouth daily.    Marland Kitchen omeprazole (PRILOSEC) 20 MG capsule Take 1 capsule (20 mg total) by mouth daily. 90 capsule 3  . ondansetron (ZOFRAN) 8 MG tablet One pill every 8 hours as needed for nausea/vomitting. 40 tablet 1  . prochlorperazine (COMPAZINE) 10 MG tablet Take 1 tablet (10 mg total) by mouth every 6 (six) hours as needed for nausea or vomiting. 40 tablet 1  . Semaglutide,0.25 or 0.5MG/DOS, (OZEMPIC, 0.25 OR 0.5 MG/DOSE,) 2 MG/1.5ML SOPN Inject 0.5 mg into the skin once a week. 1.5 mL 11  . vitamin B-12 (CYANOCOBALAMIN) 100 MCG tablet Take 100 mcg by mouth daily.    Marland Kitchen HYDROcodone-acetaminophen (NORCO) 10-325 MG tablet Take 1 tablet by mouth every 4 (four) hours as needed. 60 tablet 0  . morphine (MS CONTIN) 15 MG 12 hr tablet Take 1 tablet (15 mg total) by mouth every 12 (twelve) hours. 60 tablet 0   Current Facility-Administered Medications  Medication Dose Route Frequency Provider Last Rate Last Admin  . morphine (PF) 2 MG/ML injection 2 mg  2 mg Intravenous Once Jane Canary, MD       Facility-Administered Medications Ordered in Other Visits  Medication Dose Route Frequency Provider Last Rate Last Admin  . heparin lock flush 100 unit/mL  500 Units Intracatheter Once PRN Cammie Sickle, MD      . sacituzumab govitecan-hziy (TRODELVY) 620 mg in sodium chloride 0.9 % 250 mL (1.9872 mg/mL) chemo infusion  7.3 mg/kg (Treatment Plan Recorded) Intravenous Once Cammie Sickle, MD 156 mL/hr at 02/07/22 1210 620 mg at 02/07/22 1210      .  PHYSICAL  EXAMINATION: ECOG PERFORMANCE STATUS: 0 - Asymptomatic  Vitals:   02/07/22 0853  BP: 109/68  Pulse: (!) 122  Resp: 19  Temp: (!) 96.7 F (35.9 C)  SpO2: 98%  Filed Weights   02/07/22 0853  Weight: 168 lb (76.2 kg)     Right supraclavicular lymphadenopathy felt. 2-3 cm in size.   Physical Exam HENT:     Head: Normocephalic and atraumatic.     Mouth/Throat:     Pharynx: No oropharyngeal exudate.  Eyes:     Pupils: Pupils are equal, round, and reactive to light.  Cardiovascular:     Rate and Rhythm: Normal rate and regular rhythm.  Pulmonary:     Effort: Pulmonary effort is normal. No respiratory distress.     Breath sounds: Normal breath sounds. No wheezing.  Abdominal:     General: Bowel sounds are normal. There is no distension.     Palpations: Abdomen is soft. There is no mass.     Tenderness: There is no abdominal tenderness. There is no guarding or rebound.  Musculoskeletal:        General: No tenderness. Normal range of motion.     Cervical back: Normal range of motion and neck supple.  Skin:    General: Skin is warm.  Neurological:     Mental Status: She is alert and oriented to person, place, and time.  Psychiatric:        Mood and Affect: Affect normal.     LABORATORY DATA:  I have reviewed the data as listed Lab Results  Component Value Date   WBC 6.8 02/07/2022   HGB 9.5 (L) 02/07/2022   HCT 29.8 (L) 02/07/2022   MCV 85.9 02/07/2022   PLT 104 (L) 02/07/2022   Recent Labs    01/12/22 0937 01/24/22 0826 02/07/22 0829  NA 137 134* 132*  K 3.7 4.6 4.1  CL 103 106 101  CO2 22 19* 18*  GLUCOSE 187* 153* 182*  BUN _0 CREATININE 0.78 0.64 0.66  CALCIUM 9.3 9.0 8.4*  GFRNONAA >60 >60 >60  PROT 7.3 6.9 6.9  ALBUMIN 3.1* 2.8* 2.3*  AST 43* 67* 76*  ALT _1 ALKPHOS 176* 331* 550*  BILITOT 0.4 0.5 0.7     RADIOGRAPHIC STUDIES: I have personally reviewed the radiological images as listed and agreed with the findings in the  report. No results found.  ASSESSMENT & PLAN:   Carcinoma of upper-inner quadrant of right breast in female, estrogen receptor negative ([place]) #Recurrent right breast cancer-s/p breast skin biopsy [FEB 2023- ER positive /PR HER2-0; however 2017-ER-NEGATIVE; -HER2 NEU-1+/LOW]; - recurrent- stage IV; NGS -FEB 2023- CPS =15%. Currently on Keytruda- taxol [2/13]; BONE SCAN- November 24, 2021- Scattered sites of abnormal osseous tracer uptake  consistent with osseous metastases, including BILATERAL femora.   12/02/2021- PET SCAN-  Three hypermetabolic right breast nodules with a right supraclavicular nodal metastasis and diffuse osseous metastatic disease.  Subpleural nodular consolidation in the apical left upper lobe is new from 10/21/2021 and may be infectious/inflammatory in etiology or related to evolving radiation therapy.    #Patient tolerating chemotherapy with mild to moderate difficulty- see below-diarrhea/fatigue/poor taste/worsening pain weight loss.  As result treatments were held in between.  Her labs were reviewed today and acceptable.  We will proceed with cycle 2-day 8 of sacituzumab govitecan.   #Fatigue multifactorial-anemia hemoglobin 7.4.  Proceed with PRBC transfusion tomorrow.  Ordered.   # Diarrhea- G-1- recommend continue lomotil as planned.    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    # T2 thoracic metastatic disease [confirmed MRI  Jan 2023]-currently s/p Radiation [ 6/13-last] SEP 2023-PET scan shows progressive bone lesions;  Currently s/p left hip RT x10 fractions [ start 9/20]-however, worsening right hip pain-discussed with Dr. Donella Stade.  Will increase hydrocodone 1 to 2 pills every 6 hours new prescription given.  Please also increase fentanyl patch to 50 mcg.  New prescription given.  Continue Zometa.  Calcium normal.   # Electrolyte abnormalities: HYPO [place]emia potassium 3.2 continue K-Dur 20 mg  twice a day recommend inreasing fluid intake; hypomagnesemia-magnesium today 1.6 proceed with magnesium infusion.   # PN-1-2 sec to taxol-on Neurontin 200 qhs-STABLE.    # DM/poorly controlled- continue metformin 2000 mg q AM [Dr.Tullo]; on glipizide 10 mg XL. Continue follow up with  PCP.  On CGM.- FBG- 144STABLE.     # Iron def anemia-[Iron studies- July 2023] continue Venofer x4; Hb 7.4.  Proceed with PRBC transfusion.   # Mediport: Functioning.    Last zometa on 10/31.      # DISPOSITION: # No Chemo today;  # Zometa today # Magnesium today # 1 unit PRBC tomorrow # cancel 11/07 appts  # Follow up with MD- TBD-Dr.B      All questions were answered. The patient/family knows to call the clinic with any problems, questions or concerns.   Jane Canary, MD 02/07/2022 1:12 PM

## 2022-02-08 ENCOUNTER — Ambulatory Visit
Admission: RE | Admit: 2022-02-08 | Discharge: 2022-02-08 | Disposition: A | Discharge status: Home / Self Care | Payer: PPO | Source: Ambulatory Visit | Attending: Radiation Oncology | Admitting: Radiation Oncology

## 2022-02-08 ENCOUNTER — Encounter: Payer: Self-pay | Admitting: Internal Medicine

## 2022-02-08 ENCOUNTER — Other Ambulatory Visit: Payer: Self-pay

## 2022-02-08 DIAGNOSIS — Z5112 Encounter for antineoplastic immunotherapy: Secondary | ICD-10-CM | POA: Insufficient documentation

## 2022-02-08 DIAGNOSIS — G893 Neoplasm related pain (acute) (chronic): Secondary | ICD-10-CM | POA: Insufficient documentation

## 2022-02-08 DIAGNOSIS — R112 Nausea with vomiting, unspecified: Secondary | ICD-10-CM | POA: Insufficient documentation

## 2022-02-08 DIAGNOSIS — Z510 Encounter for antineoplastic radiation therapy: Secondary | ICD-10-CM | POA: Diagnosis not present

## 2022-02-08 LAB — RAD ONC ARIA SESSION SUMMARY
Course Elapsed Days: 2
Plan Fractions Treated to Date: 3
Plan Prescribed Dose Per Fraction: 3 Gy
Plan Total Fractions Prescribed: 10
Plan Total Prescribed Dose: 30 Gy
Reference Point Dosage Given to Date: 9 Gy
Reference Point Session Dosage Given: 3 Gy
Session Number: 3

## 2022-02-09 ENCOUNTER — Other Ambulatory Visit: Payer: Self-pay

## 2022-02-09 ENCOUNTER — Ambulatory Visit
Admission: RE | Admit: 2022-02-09 | Discharge: 2022-02-09 | Disposition: A | Discharge status: Home / Self Care | Payer: PPO | Source: Ambulatory Visit | Attending: Radiation Oncology | Admitting: Radiation Oncology

## 2022-02-09 DIAGNOSIS — Z510 Encounter for antineoplastic radiation therapy: Secondary | ICD-10-CM | POA: Diagnosis not present

## 2022-02-09 LAB — RAD ONC ARIA SESSION SUMMARY
Course Elapsed Days: 3
Plan Fractions Treated to Date: 4
Plan Prescribed Dose Per Fraction: 3 Gy
Plan Total Fractions Prescribed: 10
Plan Total Prescribed Dose: 30 Gy
Reference Point Dosage Given to Date: 12 Gy
Reference Point Session Dosage Given: 3 Gy
Session Number: 4

## 2022-02-09 LAB — THYROID PANEL WITH TSH
Free Thyroxine Index: 2.1 (ref 1.2–4.9)
T3 Uptake Ratio: 30 % (ref 24–39)
T4, Total: 6.9 ug/dL (ref 4.5–12.0)
TSH: 17.4 u[IU]/mL — ABNORMAL HIGH (ref 0.450–4.500)

## 2022-02-10 ENCOUNTER — Ambulatory Visit: Payer: PPO

## 2022-02-10 ENCOUNTER — Emergency Department: Payer: PPO

## 2022-02-10 ENCOUNTER — Ambulatory Visit
Admission: RE | Admit: 2022-02-10 | Discharge: 2022-02-10 | Disposition: A | Discharge status: Home / Self Care | Payer: PPO | Source: Ambulatory Visit | Attending: Radiation Oncology | Admitting: Radiation Oncology

## 2022-02-10 ENCOUNTER — Inpatient Hospital Stay: Payer: PPO

## 2022-02-10 ENCOUNTER — Inpatient Hospital Stay (HOSPITAL_BASED_OUTPATIENT_CLINIC_OR_DEPARTMENT_OTHER): Payer: PPO | Admitting: Hospice and Palliative Medicine

## 2022-02-10 ENCOUNTER — Ambulatory Visit (HOSPITAL_COMMUNITY): Payer: PPO

## 2022-02-10 ENCOUNTER — Other Ambulatory Visit: Payer: Self-pay

## 2022-02-10 ENCOUNTER — Emergency Department
Admission: EM | Admit: 2022-02-10 | Discharge: 2022-02-10 | Disposition: A | Discharge status: Home / Self Care | Payer: PPO | Attending: Emergency Medicine | Admitting: Emergency Medicine

## 2022-02-10 VITALS — BP 131/67 | HR 103 | Temp 97.7°F | Resp 16

## 2022-02-10 DIAGNOSIS — D4861 Neoplasm of uncertain behavior of right breast: Secondary | ICD-10-CM | POA: Diagnosis not present

## 2022-02-10 DIAGNOSIS — R9431 Abnormal electrocardiogram [ECG] [EKG]: Secondary | ICD-10-CM | POA: Diagnosis not present

## 2022-02-10 DIAGNOSIS — C801 Malignant (primary) neoplasm, unspecified: Secondary | ICD-10-CM | POA: Diagnosis not present

## 2022-02-10 DIAGNOSIS — Z510 Encounter for antineoplastic radiation therapy: Secondary | ICD-10-CM | POA: Diagnosis not present

## 2022-02-10 DIAGNOSIS — R200 Anesthesia of skin: Secondary | ICD-10-CM | POA: Diagnosis not present

## 2022-02-10 DIAGNOSIS — I10 Essential (primary) hypertension: Secondary | ICD-10-CM | POA: Diagnosis not present

## 2022-02-10 DIAGNOSIS — C7951 Secondary malignant neoplasm of bone: Secondary | ICD-10-CM | POA: Diagnosis not present

## 2022-02-10 DIAGNOSIS — C50911 Malignant neoplasm of unspecified site of right female breast: Secondary | ICD-10-CM | POA: Diagnosis not present

## 2022-02-10 DIAGNOSIS — Z95828 Presence of other vascular implants and grafts: Secondary | ICD-10-CM

## 2022-02-10 DIAGNOSIS — C7931 Secondary malignant neoplasm of brain: Secondary | ICD-10-CM | POA: Diagnosis not present

## 2022-02-10 DIAGNOSIS — Z171 Estrogen receptor negative status [ER-]: Secondary | ICD-10-CM | POA: Diagnosis not present

## 2022-02-10 LAB — PROTIME-INR
INR: 1.3 — ABNORMAL HIGH (ref 0.8–1.2)
Prothrombin Time: 15.9 seconds — ABNORMAL HIGH (ref 11.4–15.2)

## 2022-02-10 LAB — CBC WITH DIFFERENTIAL/PLATELET
Abs Immature Granulocytes: 0.19 10*3/uL — ABNORMAL HIGH (ref 0.00–0.07)
Basophils Absolute: 0 10*3/uL (ref 0.0–0.1)
Basophils Relative: 1 %
Eosinophils Absolute: 0 10*3/uL (ref 0.0–0.5)
Eosinophils Relative: 0 %
HCT: 25.7 % — ABNORMAL LOW (ref 36.0–46.0)
Hemoglobin: 8.2 g/dL — ABNORMAL LOW (ref 12.0–15.0)
Immature Granulocytes: 7 %
Lymphocytes Relative: 21 %
Lymphs Abs: 0.6 10*3/uL — ABNORMAL LOW (ref 0.7–4.0)
MCH: 27.1 pg (ref 26.0–34.0)
MCHC: 31.9 g/dL (ref 30.0–36.0)
MCV: 84.8 fL (ref 80.0–100.0)
Monocytes Absolute: 0.1 10*3/uL (ref 0.1–1.0)
Monocytes Relative: 4 %
Neutro Abs: 1.9 10*3/uL (ref 1.7–7.7)
Neutrophils Relative %: 67 %
Platelets: 53 10*3/uL — ABNORMAL LOW (ref 150–400)
RBC: 3.03 MIL/uL — ABNORMAL LOW (ref 3.87–5.11)
RDW: 20.6 % — ABNORMAL HIGH (ref 11.5–15.5)
Smear Review: NORMAL
WBC: 2.8 10*3/uL — ABNORMAL LOW (ref 4.0–10.5)
nRBC: 2.1 % — ABNORMAL HIGH (ref 0.0–0.2)

## 2022-02-10 LAB — RAD ONC ARIA SESSION SUMMARY
Course Elapsed Days: 4
Plan Fractions Treated to Date: 5
Plan Prescribed Dose Per Fraction: 3 Gy
Plan Total Fractions Prescribed: 10
Plan Total Prescribed Dose: 30 Gy
Reference Point Dosage Given to Date: 15 Gy
Reference Point Session Dosage Given: 3 Gy
Session Number: 5

## 2022-02-10 LAB — DIFFERENTIAL
Abs Immature Granulocytes: 0.19 10*3/uL — ABNORMAL HIGH (ref 0.00–0.07)
Basophils Absolute: 0 10*3/uL (ref 0.0–0.1)
Basophils Relative: 0 %
Eosinophils Absolute: 0 10*3/uL (ref 0.0–0.5)
Eosinophils Relative: 0 %
Immature Granulocytes: 6 %
Lymphocytes Relative: 23 %
Lymphs Abs: 0.8 10*3/uL (ref 0.7–4.0)
Monocytes Absolute: 0.2 10*3/uL (ref 0.1–1.0)
Monocytes Relative: 5 %
Neutro Abs: 2.3 10*3/uL (ref 1.7–7.7)
Neutrophils Relative %: 66 %

## 2022-02-10 LAB — COMPREHENSIVE METABOLIC PANEL
ALT: 29 U/L (ref 0–44)
ALT: 28 U/L (ref 0–44)
AST: 123 U/L — ABNORMAL HIGH (ref 15–41)
AST: 126 U/L — ABNORMAL HIGH (ref 15–41)
Albumin: 2.4 g/dL — ABNORMAL LOW (ref 3.5–5.0)
Albumin: 2.4 g/dL — ABNORMAL LOW (ref 3.5–5.0)
Alkaline Phosphatase: 628 U/L — ABNORMAL HIGH (ref 38–126)
Alkaline Phosphatase: 671 U/L — ABNORMAL HIGH (ref 38–126)
Anion gap: 10 (ref 5–15)
Anion gap: 9 (ref 5–15)
BUN: 15 mg/dL (ref 8–23)
BUN: 15 mg/dL (ref 8–23)
CO2: 24 mmol/L (ref 22–32)
CO2: 25 mmol/L (ref 22–32)
Calcium: 8.1 mg/dL — ABNORMAL LOW (ref 8.9–10.3)
Calcium: 8.2 mg/dL — ABNORMAL LOW (ref 8.9–10.3)
Chloride: 98 mmol/L (ref 98–111)
Chloride: 101 mmol/L (ref 98–111)
Creatinine, Ser: 0.6 mg/dL (ref 0.44–1.00)
Creatinine, Ser: 0.67 mg/dL (ref 0.44–1.00)
GFR, Estimated: >60 mL/min (ref >60)
GFR, Estimated: >60 mL/min (ref >60)
Glucose, Bld: 201 mg/dL — ABNORMAL HIGH (ref 70–99)
Glucose, Bld: 209 mg/dL — ABNORMAL HIGH (ref 70–99)
Potassium: 4 mmol/L (ref 3.5–5.1)
Potassium: 4.2 mmol/L (ref 3.5–5.1)
Sodium: 132 mmol/L — ABNORMAL LOW (ref 135–145)
Sodium: 135 mmol/L (ref 135–145)
Total Bilirubin: 0.5 mg/dL (ref 0.3–1.2)
Total Bilirubin: 0.6 mg/dL (ref 0.3–1.2)
Total Protein: 6.6 g/dL (ref 6.5–8.1)
Total Protein: 6.7 g/dL (ref 6.5–8.1)

## 2022-02-10 LAB — CBC
HCT: 25.7 % — ABNORMAL LOW (ref 36.0–46.0)
Hemoglobin: 8.2 g/dL — ABNORMAL LOW (ref 12.0–15.0)
MCH: 26.7 pg (ref 26.0–34.0)
MCHC: 31.9 g/dL (ref 30.0–36.0)
MCV: 83.7 fL (ref 80.0–100.0)
Platelets: 54 10*3/uL — ABNORMAL LOW (ref 150–400)
RBC: 3.07 MIL/uL — ABNORMAL LOW (ref 3.87–5.11)
RDW: 20.8 % — ABNORMAL HIGH (ref 11.5–15.5)
WBC: 3.4 10*3/uL — ABNORMAL LOW (ref 4.0–10.5)
nRBC: 1.5 % — ABNORMAL HIGH (ref 0.0–0.2)

## 2022-02-10 LAB — APTT: aPTT: 60 seconds — ABNORMAL HIGH (ref 24–36)

## 2022-02-10 LAB — MAGNESIUM: Magnesium: 2 mg/dL (ref 1.7–2.4)

## 2022-02-10 MED ORDER — SODIUM CHLORIDE 0.9% FLUSH
10.0000 mL | Freq: Once | INTRAVENOUS | Status: DC
  Filled 2022-02-10: qty 10

## 2022-02-10 MED ORDER — SODIUM CHLORIDE 0.9% FLUSH: 3.0000 mL | Freq: Once | INTRAVENOUS | Status: DC

## 2022-02-10 MED ORDER — GADOBUTROL 1 MMOL/ML IV SOLN
7.5000 mL | Freq: Once | INTRAVENOUS | Status: AC | PRN
  Administered 2022-02-10: 7.5 mL via INTRAVENOUS

## 2022-02-10 MED ORDER — HEPARIN SOD (PORK) LOCK FLUSH 100 UNIT/ML IV SOLN
500.0000 [IU] | Freq: Once | INTRAVENOUS | Status: AC
  Administered 2022-02-10: 500 [IU] via INTRAVENOUS
  Filled 2022-02-10: qty 5

## 2022-02-10 MED ORDER — HEPARIN SOD (PORK) LOCK FLUSH 100 UNIT/ML IV SOLN
500.0000 [IU] | Freq: Once | INTRAVENOUS | Status: DC
  Filled 2022-02-10: qty 5

## 2022-02-10 NOTE — ED Triage Notes (Signed)
Pt presents via POV c/o numbness to chin x2 days. Denies extremity weakness. 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 A&O x4. Reports sent to ED from cancer center.

## 2022-02-10 NOTE — Progress Notes (Signed)
Symptom Management [place] at [facility] [phone] [phone]  Patient Care Team: [name], [name] Noreene Filbert, MD as Referring Physician (Radiation Oncology) Cammie Sickle, MD as Consulting Physician (Internal Medicine) Bary Castilla [place] Gleason, MD as Consulting Physician (General Surgery) Jeral Fruit, RN as Registered Nurse Dannielle Karvonen, RN as [place] Management   NAME OF PATIENT: Ashlee Cox  [id number]  April 02, 1950   DATE OF VISIT: 02/10/22  REASON FOR CONSULT: Ashlee Cox is a 71 y.o. female with multiple medical problems including triple negative right breast cancer, initially diagnosed in July 2021 at stage IIIb with recurrence and progression of stage IV in January 2020 with bony metastatic disease.  Patient is status post chemotherapy and immunotherapy, currently on hold due to poor tolerance.  She is also receiving XRT.    INTERVAL HISTORY: Patient is on treatment with Ivette Loyal.  She has been receiving daily XRT.  She was an add-on to [facility] today for evaluation of 48 hours of numbness to lips, tongue, and chin.  She is having difficulty drinking out of a straw and finds liquids to spill out of her mouth.  She denies any difficulty swallowing or speaking.  She has noticed bright lights floating in and out of her vision over the past 2 days.  No overt weakness.  No changes in cognition.  She has ongoing shortness of breath but unchanged in characteristic or severity.  No chest pain.  No cough or congestion.  No fever or chills.  Denies recent fevers or illnesses. Denies any easy bleeding or bruising. Reports fair appetite and denies weight loss. Denies chest pain. Denies any nausea, vomiting, constipation, or diarrhea. Denies urinary complaints. Patient offers no further specific complaints today.   PAST MEDICAL HISTORY: Past Medical  History:  Diagnosis Date   Cancer [facility]) 2021   right breast   Cyst, breast    benign   Diabetes mellitus without complication ([place])    Family history of melanoma    Family history of ovarian cancer    GERD (gastroesophageal reflux disease)    H/O: rheumatic fever    History of colonoscopy 2010   done bc of bleeding,  normal , due back in 5 yrs ([place])   Hyperlipidemia    Hypertension    Menopause    at age 15   Personal history of chemotherapy 09/2019   RIGHT  INVASIVE MAMMARY CARCINOMA   Personal history of radiation therapy     PAST SURGICAL HISTORY:  Past Surgical History:  Procedure Laterality Date   APPENDECTOMY  2006   BREAST BIOPSY Right 09/22/2019   INVASIVE MAMMARY CARCINOMA   BREAST CYST ASPIRATION Left 1999   BREAST LUMPECTOMY Left 04/23/2020   surgery with NL and SN    BREAST LUMPECTOMY WITH NEEDLE LOCALIZATION Right 04/23/2020   Procedure: BREAST LUMPECTOMY WITH NEEDLE LOCALIZATION;  Surgeon: Robert Bellow, MD;  Location: [facility] ORS;  Service: General;  Laterality: Right;   BREAST LUMPECTOMY WITH SENTINEL LYMPH NODE BIOPSY Right 04/23/2020   Procedure: BREAST LUMPECTOMY WITH SENTINEL LYMPH NODE BX;  Surgeon: Robert Bellow, MD;  Location: [facility] ORS;  Service: General;  Laterality: Right;   CHOLECYSTECTOMY  2006   COLONOSCOPY     PORTACATH PLACEMENT Left 10/06/2019   Procedure: INSERTION PORT-A-CATH;  Surgeon: Robert Bellow, MD;  Location: [facility] ORS;  Service: General;  Laterality: Left;   SUBMUCOSAL TATTOO INJECTION Right  10/06/2019   Procedure: Right axillary TATTOO INJECTION;  Surgeon: Robert Bellow, MD;  Location: [facility] ORS;  Service: General;  Laterality: Right;   VAGINAL DELIVERY     x3    HEMATOLOGY/ONCOLOGY HISTORY:  Oncology History Overview Note  #June 2021- Right breast cancer-T2N1; 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 Byrnett.] [id number] Carbo-taxol-AC;  JAN 2022-s/p Lumpectomy & ALND- [ypT1a (93m ) ypN1 (2/11 LN-positive)]; s/p radiation  end of April 2022.  #Early June 2022-Xeloda 1000 mg per metered square [2000] 2 weeks on 1 week of 6-8 cycles; July 11th 2022- STARTING cycle #3-cut down the dose to 1500 mg BID[sec to HFS].   # JAN 2023- A. SKIN, RIGHT BREAST; PUNCH BIOPSY: [Dr.Byrnett] - INVASIVE CARCINOMA WITH DERMAL INFILTRATION AND LYMPHATIC INVASION. ER- weak POSISTIVE [10%]; PR/her2 NEG [[facility] 0]; JAN 20th, 2023- IMPRESSION: 1. Although the hypermetabolic right axillary node has resolved since the prior PET, there is a new hypermetabolic right low cervical/supraclavicular node, suspicious for nodal metastasis. 2. Decrease in right breast hypermetabolism with nonspecific overlying skin thickening in the setting of prior lumpectomy. 3. Low-level hypermetabolism within the T2 vertebral body, suspicious for isolated osseous metastasis. This could be confirmed with pre and postcontrast thoracic spine MRI. 4. Hypermetabolic thoracic nodes, slightly progressive. Given progression, nodal metastasis are slightly favored over reactive etiologies. 5. Incidental findings, including: Aortic Atherosclerosis (ICD10-I70.0). Tiny hiatal hernia. Hepatic steatosis with nonspecific caudate lobe enlargemen  Comment:  The carcinoma is morphologically similar to the post-neoadjuvant mammary  carcinoma seen in the January 2022 right breast wide excision and lymph  Nodes  # FEB 6th-Taxol single agent #1; FEB 13th, 2023- [CPS =15]; Taxol-Keytruda  MAY 2023- CT CHEST:  Since the PET of 04/15/2021, enlargement of the previously described hypermetabolic right low cervical/supraclavicular nodal metastasis. Foci of hyperenhancement within the right breast are new or more well-defined since 02/02/2021, suspicious for progressive residual/recurrent disease. Nonspecific mediastinal nodes, similar to slightly decreased in size;  New sclerosis at the site of a known T2 vertebral body metastasis.   # s/p palliative radiation therapy to her right  supraclavicular nodal metastasis as well as T2 vertebral body [30 Gray in 10 fractions ;[5/31 to  6/13].  CONTINUED TAXOL-KEYTRUDA; SEP 6th 2023-bone scan progressive disease.  Discontinue Taxol-Keytruda  #  SEP 19th 2023- SACITUZUMAB GOVITECAN [at 80% dose]   DIAGNOSIS: Right breast cancer triple negative      Carcinoma of upper-inner quadrant of right breast in female, estrogen receptor negative ([place]  10/07/2019 Initial Diagnosis   Carcinoma of upper-inner quadrant of right breast in female, estrogen receptor negative ([place]   12/04/2019 Genetic Testing   Negative genetic testing. No pathogenic variants identified on the [employer] Common Hereditary Cancers Panel + Skin Cancers Panel. The report date is 12/04/2019.  The Common Hereditary Cancers Panel + Skin Cancers Panel offered by [facility] includes sequencing and/or deletion duplication testing of the following 54 genes: APC*, ATM*, AXIN2, BAP1, BARD1, BMPR1A, BRCA1, BRCA2, BRIP1, CDH1, CDK4, CDKN2A (p14ARF), CDKN2A (p16INK4a), CHEK2, CTNNA1, DICER1*, EPCAM*, GREM1*, HOXB13, KIT, MEN1*, MITF*, MLH1*, MSH2*, MSH3*, MSH6*, MUTYH, NBN, NF1*, NTHL1, PALB2, PDGFRA, PMS2*, POLD1*, POLE, POT1, PTCH1, PTEN*, RAD50, RAD51C, RAD51D, RB1*, RNF43, SDHA*, SDHB, SDHC*, SDHD, SMAD4, SMARCA4, STK11, SUFU, TP53, TSC1*, TSC2, VHL.    05/07/2020 Cancer Staging   Staging form: Breast, AJCC 8th Edition - Clinical: Stage IIIB (cT2, cN1, cM0, G3, ER-, PR-, HER2-) - Signed by BCammie Sickle MD on 05/07/2020   04/18/2021 Cancer Staging   Staging form: Breast, AJCC 8th Edition -  Pathologic: Stage IV (pT4d, pN3c, cM1, ER+, PR-, HER2-) - Signed by Cammie Sickle, MD on 04/18/2021 Nuclear grade: G3     ALLERGIES:  has No Known Allergies.  MEDICATIONS:  Current Outpatient Medications  Medication Sig Dispense Refill   calcium carbonate (OS-CAL) 600 MG TABS tablet Take 600 mg by mouth daily.     Continuous Blood Gluc Sensor (FREESTYLE LIBRE 3 SENSOR) MISC  Apply 1 each topically every 14 (fourteen) days. Place 1 sensor on the skin every 14 days. Use to check glucose continuously 6 each 3   dicyclomine (BENTYL) 20 MG tablet Take 1 tablet (20 mg total) by mouth every 8 (eight) hours as needed for spasms. 30 tablet 0   diphenoxylate-atropine (LOMOTIL) 2.5-0.025 MG tablet Take 1 tablet by mouth 4 (four) times daily as needed for diarrhea or loose stools. Take it along with immodium 60 tablet 0   gabapentin (NEURONTIN) 100 MG capsule TAKE 2 CAPSULES BY MOUTH AT BEDTIME 180 capsule 0   glipiZIDE (GLUCOTROL XL) 10 MG 24 hr tablet Take 1 tablet by mouth once daily with breakfast 90 tablet 0   HYDROcodone-acetaminophen (NORCO) 10-325 MG tablet Take 1 tablet by mouth every 4 (four) hours as needed. 60 tablet 0   KLOR-CON M20 20 MEQ tablet Take 1 tablet by mouth twice daily 60 tablet 0   levothyroxine (SYNTHROID) 100 MCG tablet TAKE 1 TABLET BY MOUTH ONCE DAILY IN THE MORNING BEFORE BREAKFAST 90 tablet 0   lidocaine-prilocaine (EMLA) cream Apply 1 application topically as needed. Apply to port and cover with saran wrap 1-2 hours prior to port access 30 g 1   losartan-hydrochlorothiazide (HYZAAR) 50-12.5 MG tablet Take 1 tablet by mouth once daily 90 tablet 0   lovastatin (MEVACOR) 40 MG tablet TAKE 1 TABLET BY MOUTH AT BEDTIME 90 tablet 1   Magnesium Cl-Calcium Carbonate (SLOW MAGNESIUM/CALCIUM) 70-117 MG TBEC Take 1 tablet by mouth 2 (two) times daily. 60 tablet 6   metFORMIN (GLUCOPHAGE) 1000 MG tablet TAKE 2 TABLETS BY MOUTH ONCE DAILY WITH BREAKFAST 180 tablet 0   morphine (MS CONTIN) 15 MG 12 hr tablet Take 1 tablet (15 mg total) by mouth every 12 (twelve) hours. 60 tablet 0   Multiple Vitamin (MULTIVITAMIN) tablet Take 1 tablet by mouth daily.     omeprazole (PRILOSEC) 20 MG capsule Take 1 capsule (20 mg total) by mouth daily. 90 capsule 3   ondansetron (ZOFRAN) 8 MG tablet One pill every 8 hours as needed for nausea/vomitting. 40 tablet 1    prochlorperazine (COMPAZINE) 10 MG tablet Take 1 tablet (10 mg total) by mouth every 6 (six) hours as needed for nausea or vomiting. 40 tablet 1   Semaglutide,0.25 or 0.5MG/DOS, (OZEMPIC, 0.25 OR 0.5 MG/DOSE,) 2 MG/1.5ML SOPN Inject 0.5 mg into the skin once a week. 1.5 mL 11   vitamin B-12 (CYANOCOBALAMIN) 100 MCG tablet Take 100 mcg by mouth daily.     No current facility-administered medications for this visit.    VITAL SIGNS: There were no vitals taken for this visit. There were no vitals filed for this visit.  Estimated body mass index is 26.31 kg/m as calculated from the following:   Height as of 01/03/22: _0  (1.702 m).   Weight as of 02/07/22: 168 lb (76.2 kg).  LABS: CBC:    Component Value Date/Time   WBC 6.8 02/07/2022 0829   HGB 9.5 (L) 02/07/2022 0829   HCT 29.8 (L) 02/07/2022 0829   PLT 104 (L) 02/07/2022  0829   MCV 85.9 02/07/2022 0829   NEUTROABS 4.1 02/07/2022 0829   LYMPHSABS 1.4 02/07/2022 0829   MONOABS 0.8 02/07/2022 0829   EOSABS 0.0 02/07/2022 0829   BASOSABS 0.0 02/07/2022 0829   Comprehensive Metabolic Panel:    Component Value Date/Time   NA 132 (L) 02/07/2022 0829   K 4.1 02/07/2022 0829   CL 101 02/07/2022 0829   CO2 18 (L) 02/07/2022 0829   BUN 12 02/07/2022 0829   CREATININE 0.66 02/07/2022 0829   GLUCOSE 182 (H) 02/07/2022 0829   CALCIUM 8.4 (L) 02/07/2022 0829   AST 76 (H) 02/07/2022 0829   ALT 25 02/07/2022 0829   ALKPHOS 550 (H) 02/07/2022 0829   BILITOT 0.7 02/07/2022 0829   PROT 6.9 02/07/2022 0829   ALBUMIN 2.3 (L) 02/07/2022 0829    RADIOGRAPHIC STUDIES: No results found.  PERFORMANCE STATUS (ECOG) : 2 - Symptomatic, <50% confined to bed  Review of Systems Unless otherwise noted, a complete review of systems is negative.  Physical Exam General: NAD Cardiovascular: regular rate and rhythm Pulmonary: clear ant fields Abdomen: soft, nontender, + bowel sounds GU: no suprapubic tenderness Extremities: no edema, no joint  deformities Skin: no rashes Neurological: Generalized weakness, no focal deficits, numbness to chin  IMPRESSION/PLAN: Facial numbness -I am concerned that symptoms could reflect neurological event or cancer progression.  We tried to coordinate obtaining MRI of the brain in addition to CTs of the chest, abdomen, and pelvis for further work-up.  Unfortunately, MRI of the brain could not be accommodated today.  Discussed with Dr. Darrall Dears who recommended patient transfer to the ED for further evaluation.  Patient and family were in agreement.  Stage IV triple negative breast cancer -on treatment with Trodelvy.  Progressive symptoms in addition to rising AST and alk phos are concerning for possible disease progression.  Again, recommend CT of the chest, abdomen, and pelvis for further evaluation.  Pancytopenia -likely secondary to cancer treatment.  Neoplasm related pain -improved after starting MS Contin  Case and plan discussed with Dr. Darrall Dears.   Patient expressed understanding and was in agreement with this plan. She also understands that She can call clinic at any time with any questions, concerns, or complaints.   Thank you for allowing me to participate in the care of this very pleasant patient.   Time Total: 25 minutes  Visit consisted of counseling and education dealing with the complex and emotionally intense issues of symptom management in the setting of serious illness.Greater than 50%  of this time was spent counseling and coordinating care related to the above assessment and plan.  Signed by: Altha Harm, PhD, NP-C

## 2022-02-10 NOTE — Discharge Instructions (Signed)
Your MRI today shows some spread of your cancer to the mandible which is affecting one of the sensory nerves of your face.  You can follow-up with your cancer team at your next appointment regarding this finding.  MRI summary: IMPRESSION: 1. 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 2. Small focus of soft tissue enhancement adjacent to the right mandibular foramen concerning for perineural spread along the mandibular nerve. No other convincing evidence of soft tissue tumor or perineural spread of tumor. 3. No acute intracranial pathology or evidence of intracranial metastatic disease.

## 2022-02-10 NOTE — ED Notes (Signed)
Pt ambulatory to toilet and back to bed with 1 assist.

## 2022-02-10 NOTE — Progress Notes (Signed)
Pt transported to ED via personal vehicle. Port left accessed.

## 2022-02-10 NOTE — ED Notes (Signed)
Pt ambulated to toliet and back with 1 assist.

## 2022-02-10 NOTE — ED Provider Notes (Signed)
[facility] Provider Note    Event Date/Time   First MD Initiated Contact with Patient 02/10/22 1359     (approximate)   History   Chief complaint is chin numbness   HPI  Ashlee Cox is a 71 y.o. female with stage IV cancer who reports about 2 days of numbness in her chin.  She reports her whole chin feels like she went to the dentist and they numbed it up.  Both sides are numb.  She is drooling water when she drinks.  Cancer center sent her here to rule out stroke.  I discussed her in detail with neurology who feels this is possibly an infiltrative process affecting the mental nerve.  Radiology recommends MRI of the face which I have ordered along with MRI of the head which is suggested by radiology as well.      Physical Exam   Triage Vital Signs: ED Triage Vitals  Enc Vitals Group     BP 02/10/22 1243 126/65     Pulse Rate 02/10/22 1243 (!) 109     Resp 02/10/22 1243 18     Temp 02/10/22 1243 98.2 F (36.8 C)     Temp src --      SpO2 02/10/22 1243 96 %     Weight --      Height --      Head Circumference --      Peak Flow --      Pain Score 02/10/22 1241 0     Pain Loc --      Pain Edu? --      Excl. in [place]? --     Most recent vital signs: Vitals:   02/10/22 1243  BP: 126/65  Pulse: (!) 109  Resp: 18  Temp: 98.2 F (36.8 C)  SpO2: 96%    General: Awake, no distress.  Head normocephalic atraumatic Eyes pupils equal extraocular movements intact Mouth is normal but chin is numb to touch.  There is no pain on palpation of the chin. CV:  Good peripheral perfusion.  Resp:  Normal effort.  Lungs are clear Abd:  No distention.  Soft and nontender Neuro exam cranial nerves II through XII are intact except for the chin numbness.  Cerebellar finger-nose rapid alternating movements in the hands are normal heel-to-shin is normal Motor strength is 5/5 throughout and patient does not report any numbness  ED Results / Procedures /  Treatments   Labs (all labs ordered are listed, but only abnormal results are displayed) Labs Reviewed  PROTIME-INR - Abnormal; Notable for the following components:      Result Value   Prothrombin Time 15.9 (*)    INR 1.3 (*)    All other components within normal limits  APTT - Abnormal; Notable for the following components:   aPTT 60 (*)    All other components within normal limits  CBC - Abnormal; Notable for the following components:   WBC 3.4 (*)    RBC 3.07 (*)    Hemoglobin 8.2 (*)    HCT 25.7 (*)    RDW 20.8 (*)    Platelets 54 (*)    nRBC 1.5 (*)    All other components within normal limits  DIFFERENTIAL - Abnormal; Notable for the following components:   Abs Immature Granulocytes 0.19 (*)    All other components within normal limits  COMPREHENSIVE METABOLIC PANEL - Abnormal; Notable for the following components:   Glucose, Bld 209 (*)  Calcium 8.2 (*)    Albumin 2.4 (*)    AST 126 (*)    Alkaline Phosphatase 671 (*)    All other components within normal limits  ETHANOL  I-STAT CREATININE, ED  CBG MONITORING, ED     EKG  EKG read interpreted by me shows normal sinus rhythm rate of 100 normal axis nonspecific ST-T wave changes   RADIOLOGY CT of the head read by radiology reviewed by me shows no acute disease   PROCEDURES:  Critical Care performed:   Procedures   MEDICATIONS ORDERED IN ED: Medications  sodium chloride flush (NS) 0.9 % injection 3 mL (has no administration in time range)     IMPRESSION / MDM / ASSESSMENT AND PLAN / ED COURSE  I reviewed the triage vital signs and the nursing notes.  I am signing the patient out to oncoming provider pending the results of MRI of the face and brain Differential diagnosis includes, but is not limited to, infiltrative process affecting the maxillary nerve sounds like the thing that could be going on here.  Stroke is a possibility but should not affect both sides of the nerve like that.  There is no  sign of any injury affecting the jaw or nerve.  Patient's presentation is most consistent with acute complicated illness / injury requiring diagnostic workup.  The patient is on the cardiac monitor to evaluate for evidence of arrhythmia and/or significant heart rate changes.  None have been seen    FINAL CLINICAL IMPRESSION(S) / ED DIAGNOSES   Final diagnoses:  Neoplasm of uncertain behavior of upper outer quadrant of female breast, right   Also bilateral chin numbness  Rx / DC Orders   ED Discharge Orders     None        Note:  This document was prepared using Dragon voice recognition software and may include unintentional dictation errors.   Nena Polio, MD 02/10/22 [phone]

## 2022-02-10 NOTE — ED Provider Notes (Signed)
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  No airway threat.  No difficulty swallowing or breathing or managing secretions.  She can follow-up with her cancer team at her next established appointment which is in 4 days.   Ashlee Mew, MD 02/10/22 1910

## 2022-02-10 NOTE — ED Notes (Signed)
When pt drank water, spilled out left side. Can drink just won't stay in mouth.

## 2022-02-10 NOTE — ED Triage Notes (Addendum)
Pt comes with c/o numbness to chin for about 2 days. Pt states she can't take her meds. Pt went to cancer center today and was sent here for evaluation of ct scan of head to rule out stroke.  Pt states weakness all over. Pt is not on thinners.  Pt denies any blurry vision or dizziness.    Pt has port accessed at this time. Pt did have radiation treatment performed today.

## 2022-02-10 NOTE — Progress Notes (Signed)
Add on to smc. Pt states that she has had difficulty eating and drinking for the last two days due to numbness in lower jaw and bottom lip, stating that liquids tend to run out of her mouth. Reports that she started morphine earlier this week.

## 2022-02-12 ENCOUNTER — Other Ambulatory Visit: Payer: Self-pay | Admitting: Internal Medicine

## 2022-02-13 ENCOUNTER — Telehealth: Payer: Self-pay

## 2022-02-13 ENCOUNTER — Ambulatory Visit
Admission: RE | Admit: 2022-02-13 | Discharge: 2022-02-13 | Disposition: A | Discharge status: Home / Self Care | Payer: PPO | Source: Ambulatory Visit | Attending: Radiation Oncology | Admitting: Radiation Oncology

## 2022-02-13 ENCOUNTER — Ambulatory Visit: Payer: Self-pay

## 2022-02-13 ENCOUNTER — Other Ambulatory Visit: Payer: Self-pay

## 2022-02-13 ENCOUNTER — Telehealth: Payer: Self-pay | Admitting: *Deleted

## 2022-02-13 DIAGNOSIS — Z510 Encounter for antineoplastic radiation therapy: Secondary | ICD-10-CM | POA: Diagnosis not present

## 2022-02-13 LAB — RAD ONC ARIA SESSION SUMMARY
Course Elapsed Days: 7
Plan Fractions Treated to Date: 6
Plan Prescribed Dose Per Fraction: 3 Gy
Plan Total Fractions Prescribed: 10
Plan Total Prescribed Dose: 30 Gy
Reference Point Dosage Given to Date: 18 Gy
Reference Point Session Dosage Given: 3 Gy
Session Number: 6

## 2022-02-13 NOTE — Telephone Encounter (Signed)
Mr Primiano called asking if patient needs to be seen in clinic today based on results from ER visit Friday. 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  No airway threat.  No difficulty swallowing or breathing or managing secretions.  She can follow-up with her cancer team at her next established appointment which is in 4 days.   Carrie Mew, MD 02/10/22 1910

## 2022-02-13 NOTE — Telephone Encounter (Signed)
Transition Care Management Follow-up Telephone Call Date of discharge and from where: 02/10/2022 How have you been since you were released from the hospital? Relieved that she did not have stroke Any questions or concerns? No  Items Reviewed: Did the pt receive and understand the discharge instructions provided? Yes  Medications obtained and verified?  No new medications from ED Other?  NA Any new allergies since your discharge? No  Dietary orders reviewed? Yes Do you have support at home? Yes   Home Care and Equipment/Supplies: Were home health services ordered? not applicable If so, what is the name of the agency? NA  Has the agency set up a time to come to the patient's home? not applicable Were any new equipment or medical supplies ordered?  No What is the name of the medical supply agency? NA Were you able to get the supplies/equipment? not applicable Do you have any questions related to the use of the equipment or supplies? No  Functional Questionnaire: (I = Independent and D = Dependent) ADLs: I  Bathing/Dressing- I  Meal Prep- I  Eating- I  Maintaining continence- I  Transferring/Ambulation- I  Managing Meds- I  Follow up appointments reviewed:  PCP Hospital f/u appt confirmed? Yes  Scheduled to see Dr. Derrel Nip on 02/22/2022 @ 10:30a. [facility] f/u appt confirmed? Yes  Scheduled to see Beckey Rutter, NP on 02/14/2022 @ 9:45a. Are transportation arrangements needed? No  If their condition worsens, is the pt aware to call PCP or go to the Emergency Dept.? Yes Was the patient provided with contact information for the PCP's office or ED? Yes Was to pt encouraged to call back with questions or concerns? Yes

## 2022-02-13 NOTE — Patient Outreach (Signed)
  Care Coordination   Follow Up Visit Note   02/13/2022 Name: Ashlee Cox MRN: [id number] DOB: [birth year]  Ashlee Cox is a 71 y.o. year old female who sees Derrel Nip, Aris Everts, MD for primary care. I spoke with  [name] and spouse, Emmalynn Pinkham by phone today.  What matters to the patients health and wellness today?  Patient reports having recent ED visit due to jaw numbness.  She states she was told by the ED doctor that the cancer may have spread to her jaw area.  Spouse states patient was placed on Morphine XR from the ED visit.  Patient states she is having a lot of anxiety, can sit still, eat or sleep.   He states he is noticing patient is experiencing anxiety and some confusion.  He states patient is not complaining of any pain. Spouse states he and patient called and left a message with the oncologist nurse earlier today and is waiting for a return call.    Goals Addressed             This Visit's Progress    Patient Stated: " I want to be cancer free and maintain my quality of life"       Care Coordination Interventions: Evaluation of current treatment plan related to breast cancer and patient's adherence to plan as established by provider Reviewed medications with patient and discussed importance of compliance,.  Advised to discuss OTC medication with provider prior to patient taking.  Reviewed scheduled/upcoming provider appointments  Discussed with patient importance of ongoing proper nutrition while undergoing cancer treatment Discussed new symptoms ( anxiety , confusion) patient experiencing and recent ED visit.  Encouraged patient/ spouse to discuss new symptoms with oncology office/ provider Reviewed scheduled/ upcoming appointments.            SDOH assessments and interventions completed:  No     Care Coordination Interventions Activated:  Yes  Care Coordination Interventions:  Yes, provided   Follow up plan: Follow up call scheduled for  02/13/22    Encounter Outcome:  Pt. Visit Completed   [name] RN,BSN,CCM [facility] [phone] direct line

## 2022-02-14 ENCOUNTER — Encounter: Payer: Self-pay | Admitting: Nurse Practitioner

## 2022-02-14 ENCOUNTER — Inpatient Hospital Stay (HOSPITAL_BASED_OUTPATIENT_CLINIC_OR_DEPARTMENT_OTHER): Payer: PPO | Admitting: Hospice and Palliative Medicine

## 2022-02-14 ENCOUNTER — Inpatient Hospital Stay: Payer: PPO

## 2022-02-14 ENCOUNTER — Other Ambulatory Visit: Payer: Self-pay

## 2022-02-14 ENCOUNTER — Inpatient Hospital Stay (HOSPITAL_BASED_OUTPATIENT_CLINIC_OR_DEPARTMENT_OTHER): Payer: PPO | Admitting: Nurse Practitioner

## 2022-02-14 ENCOUNTER — Ambulatory Visit
Admission: RE | Admit: 2022-02-14 | Discharge: 2022-02-14 | Disposition: A | Discharge status: Home / Self Care | Payer: PPO | Source: Ambulatory Visit | Attending: Radiation Oncology | Admitting: Radiation Oncology

## 2022-02-14 ENCOUNTER — Ambulatory Visit
Admission: RE | Admit: 2022-02-14 | Discharge: 2022-02-14 | Disposition: A | Discharge status: Home / Self Care | Payer: PPO | Source: Ambulatory Visit | Attending: Nurse Practitioner | Admitting: Nurse Practitioner

## 2022-02-14 VITALS — BP 122/64 | HR 92 | Temp 97.7°F | Wt 164.3 lb

## 2022-02-14 DIAGNOSIS — F411 Generalized anxiety disorder: Secondary | ICD-10-CM | POA: Diagnosis not present

## 2022-02-14 DIAGNOSIS — N6459 Other signs and symptoms in breast: Secondary | ICD-10-CM | POA: Diagnosis not present

## 2022-02-14 DIAGNOSIS — Z510 Encounter for antineoplastic radiation therapy: Secondary | ICD-10-CM | POA: Diagnosis not present

## 2022-02-14 DIAGNOSIS — M4317 Spondylolisthesis, lumbosacral region: Secondary | ICD-10-CM | POA: Diagnosis not present

## 2022-02-14 DIAGNOSIS — Z171 Estrogen receptor negative status [ER-]: Secondary | ICD-10-CM

## 2022-02-14 DIAGNOSIS — C801 Malignant (primary) neoplasm, unspecified: Secondary | ICD-10-CM | POA: Diagnosis not present

## 2022-02-14 DIAGNOSIS — D6481 Anemia due to antineoplastic chemotherapy: Secondary | ICD-10-CM | POA: Diagnosis not present

## 2022-02-14 DIAGNOSIS — C50911 Malignant neoplasm of unspecified site of right female breast: Secondary | ICD-10-CM | POA: Insufficient documentation

## 2022-02-14 DIAGNOSIS — C7951 Secondary malignant neoplasm of bone: Secondary | ICD-10-CM | POA: Diagnosis not present

## 2022-02-14 DIAGNOSIS — D4861 Neoplasm of uncertain behavior of right breast: Secondary | ICD-10-CM

## 2022-02-14 DIAGNOSIS — G893 Neoplasm related pain (acute) (chronic): Secondary | ICD-10-CM

## 2022-02-14 DIAGNOSIS — J984 Other disorders of lung: Secondary | ICD-10-CM | POA: Diagnosis not present

## 2022-02-14 DIAGNOSIS — R590 Localized enlarged lymph nodes: Secondary | ICD-10-CM | POA: Diagnosis not present

## 2022-02-14 DIAGNOSIS — T451X5A Adverse effect of antineoplastic and immunosuppressive drugs, initial encounter: Secondary | ICD-10-CM

## 2022-02-14 DIAGNOSIS — I700 Atherosclerosis of aorta: Secondary | ICD-10-CM | POA: Diagnosis not present

## 2022-02-14 DIAGNOSIS — K769 Liver disease, unspecified: Secondary | ICD-10-CM | POA: Diagnosis not present

## 2022-02-14 DIAGNOSIS — Z95828 Presence of other vascular implants and grafts: Secondary | ICD-10-CM

## 2022-02-14 LAB — CBC WITH DIFFERENTIAL/PLATELET
Abs Immature Granulocytes: 0.4 10*3/uL — ABNORMAL HIGH (ref 0.00–0.07)
Basophils Absolute: 0 10*3/uL (ref 0.0–0.1)
Basophils Relative: 0 %
Eosinophils Absolute: 0 10*3/uL (ref 0.0–0.5)
Eosinophils Relative: 0 %
HCT: 24.2 % — ABNORMAL LOW (ref 36.0–46.0)
Hemoglobin: 7.7 g/dL — ABNORMAL LOW (ref 12.0–15.0)
Lymphocytes Relative: 21 %
Lymphs Abs: 0.8 10*3/uL (ref 0.7–4.0)
MCH: 26.8 pg (ref 26.0–34.0)
MCHC: 31.8 g/dL (ref 30.0–36.0)
MCV: 84.3 fL (ref 80.0–100.0)
Metamyelocytes Relative: 8 %
Monocytes Absolute: 0.3 10*3/uL (ref 0.1–1.0)
Monocytes Relative: 9 %
Myelocytes: 4 %
Neutro Abs: 2.1 10*3/uL (ref 1.7–7.7)
Neutrophils Relative %: 58 %
Platelets: 47 10*3/uL — ABNORMAL LOW (ref 150–400)
RBC: 2.87 MIL/uL — ABNORMAL LOW (ref 3.87–5.11)
RDW: 20.5 % — ABNORMAL HIGH (ref 11.5–15.5)
Smear Review: NORMAL
WBC: 3.7 10*3/uL — ABNORMAL LOW (ref 4.0–10.5)
nRBC: 6.2 % — ABNORMAL HIGH (ref 0.0–0.2)

## 2022-02-14 LAB — COMPREHENSIVE METABOLIC PANEL
ALT: 24 U/L (ref 0–44)
AST: 53 U/L — ABNORMAL HIGH (ref 15–41)
Albumin: 2.4 g/dL — ABNORMAL LOW (ref 3.5–5.0)
Alkaline Phosphatase: 416 U/L — ABNORMAL HIGH (ref 38–126)
Anion gap: 10 (ref 5–15)
BUN: 9 mg/dL (ref 8–23)
CO2: 23 mmol/L (ref 22–32)
Calcium: 8.4 mg/dL — ABNORMAL LOW (ref 8.9–10.3)
Chloride: 98 mmol/L (ref 98–111)
Creatinine, Ser: 0.63 mg/dL (ref 0.44–1.00)
GFR, Estimated: >60 mL/min (ref >60)
Glucose, Bld: 285 mg/dL — ABNORMAL HIGH (ref 70–99)
Potassium: 3.5 mmol/L (ref 3.5–5.1)
Sodium: 131 mmol/L — ABNORMAL LOW (ref 135–145)
Total Bilirubin: 0.5 mg/dL (ref 0.3–1.2)
Total Protein: 6.4 g/dL — ABNORMAL LOW (ref 6.5–8.1)

## 2022-02-14 LAB — FERRITIN: Ferritin: 1370 ng/mL — ABNORMAL HIGH (ref 11–307)

## 2022-02-14 LAB — RAD ONC ARIA SESSION SUMMARY
Course Elapsed Days: 8
Plan Fractions Treated to Date: 7
Plan Prescribed Dose Per Fraction: 3 Gy
Plan Total Fractions Prescribed: 10
Plan Total Prescribed Dose: 30 Gy
Reference Point Dosage Given to Date: 21 Gy
Reference Point Session Dosage Given: 3 Gy
Session Number: 7

## 2022-02-14 LAB — MAGNESIUM: Magnesium: 1.8 mg/dL (ref 1.7–2.4)

## 2022-02-14 LAB — IRON AND TIBC
Iron: 98 ug/dL (ref 28–170)
Saturation Ratios: 36 % — ABNORMAL HIGH (ref 10.4–31.8)
TIBC: 270 ug/dL (ref 250–450)
UIBC: 172 ug/dL

## 2022-02-14 MED ORDER — PREDNISONE 20 MG PO TABS: 20.0000 mg | ORAL_TABLET | Freq: Every day | ORAL | 0 refills | Status: AC

## 2022-02-14 MED ORDER — MORPHINE SULFATE (PF) 2 MG/ML IV SOLN
4.0000 mg | Freq: Once | INTRAVENOUS | Status: AC | PRN
  Administered 2022-02-14: 4 mg via INTRAVENOUS
  Filled 2022-02-14: qty 2

## 2022-02-14 MED ORDER — HEPARIN SOD (PORK) LOCK FLUSH 100 UNIT/ML IV SOLN
500.0000 [IU] | Freq: Once | INTRAVENOUS | Status: AC
  Administered 2022-02-14: 500 [IU] via INTRAVENOUS
  Filled 2022-02-14: qty 5

## 2022-02-14 MED ORDER — SODIUM CHLORIDE 0.9% FLUSH
10.0000 mL | Freq: Once | INTRAVENOUS | Status: AC
  Filled 2022-02-14: qty 10

## 2022-02-14 MED ORDER — SODIUM CHLORIDE 0.9 % IV SOLN
Freq: Once | INTRAVENOUS | Status: AC
  Filled 2022-02-14: qty 250

## 2022-02-14 MED ORDER — ALPRAZOLAM 0.25 MG PO TABS: 0.2500 mg | ORAL_TABLET | Freq: Three times a day (TID) | ORAL | 0 refills | Status: AC | PRN

## 2022-02-14 MED ORDER — IOHEXOL 300 MG/ML  SOLN
100.0000 mL | Freq: Once | INTRAMUSCULAR | Status: AC | PRN
  Administered 2022-02-14: 100 mL via INTRAVENOUS

## 2022-02-14 MED ORDER — DULOXETINE HCL 30 MG PO CPEP: 30.0000 mg | ORAL_CAPSULE | Freq: Every day | ORAL | 0 refills | Status: AC

## 2022-02-14 NOTE — Progress Notes (Signed)
[facility] [place] [facility] [place]. [facility] [address] [place], [place] [address] [phone] (phone) [phone] (fax)  Patient Care Team: [name], [name] Noreene Filbert, MD as Referring Physician (Radiation Oncology) Cammie Sickle, MD as Consulting Physician (Internal Medicine) Bary Castilla Forest Gleason, MD as Consulting Physician (General Surgery) Jeral Fruit, RN as Registered Nurse Dannielle Karvonen, RN as [facility] Management   Name of the patient: Ashlee Cox  [id number]  [birth year]   Date of visit: 02/14/22  Diagnosis- Metastatic Breast Cancer  Chief complaint/ Reason for visit- anxiety  Heme/Onc history:  Oncology History Overview Note  #June 2021- Right breast cancer-T2N1; 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 Byrnett.] [id number] Carbo-taxol-AC;  JAN 2022-s/p Lumpectomy & ALND- [ypT1a (31m ) ypN1 (2/11 LN-positive)]; s/p radiation end of April 2022.  #Early June 2022-Xeloda 1000 mg per metered square [2000] 2 weeks on 1 week of 6-8 cycles; July 11th 2022- STARTING cycle #3-cut down the dose to 1500 mg BID[sec to HFS].   # JAN 2023- A. SKIN, RIGHT BREAST; PUNCH BIOPSY: [Dr.Byrnett] - INVASIVE CARCINOMA WITH DERMAL INFILTRATION AND LYMPHATIC INVASION. ER- weak POSISTIVE [10%]; PR/her2 NEG [[facility] 0]; JAN 20th, 2023- IMPRESSION: 1. Although the hypermetabolic right axillary node has resolved since the prior PET, there is a new hypermetabolic right low cervical/supraclavicular node, suspicious for nodal metastasis. 2. Decrease in right breast hypermetabolism with nonspecific overlying skin thickening in the setting of prior lumpectomy. 3. Low-level hypermetabolism within the T2 vertebral body, suspicious for isolated osseous metastasis. This could be confirmed with pre and postcontrast thoracic spine MRI. 4.  Hypermetabolic thoracic nodes, slightly progressive. Given progression, nodal metastasis are slightly favored over reactive etiologies. 5. Incidental findings, including: Aortic Atherosclerosis (ICD10-I70.0). Tiny hiatal hernia. Hepatic steatosis with nonspecific caudate lobe enlargemen  Comment:  The carcinoma is morphologically similar to the post-neoadjuvant mammary  carcinoma seen in the January 2022 right breast wide excision and lymph  Nodes  # FEB 6th-Taxol single agent #1; FEB 13th, 2023- [CPS =15]; Taxol-Keytruda  MAY 2023- CT CHEST:  Since the PET of 04/15/2021, enlargement of the previously described hypermetabolic right low cervical/supraclavicular nodal metastasis. Foci of hyperenhancement within the right breast are new or more well-defined since 02/02/2021, suspicious for progressive residual/recurrent disease. Nonspecific mediastinal nodes, similar to slightly decreased in size;  New sclerosis at the site of a known T2 vertebral body metastasis.   # s/p palliative radiation therapy to her right supraclavicular nodal metastasis as well as T2 vertebral body [30 Gray in 10 fractions ;[5/31 to  6/13].  CONTINUED TAXOL-KEYTRUDA; SEP 6th 2023-bone scan progressive disease.  Discontinue Taxol-Keytruda  #  SEP 19th 2023- SACITUZUMAB GOVITECAN [at 80% dose]   DIAGNOSIS: Right breast cancer triple negative      Carcinoma of upper-inner quadrant of right breast in female, estrogen receptor negative ([facility]  10/07/2019 Initial Diagnosis   Carcinoma of upper-inner quadrant of right breast in female, estrogen receptor negative ([facility]   12/04/2019 Genetic Testing   Negative genetic testing. No pathogenic variants identified on the [facility] Common Hereditary Cancers Panel + Skin Cancers Panel. The report date is 12/04/2019.  The Common Hereditary Cancers Panel + Skin Cancers Panel offered by [facility] includes sequencing and/or deletion duplication testing of the following 54 genes: APC*, ATM*,  AXIN2, BAP1, BARD1, BMPR1A, BRCA1, BRCA2, BRIP1, CDH1, CDK4, CDKN2A (p14ARF), CDKN2A (p16INK4a), CHEK2, CTNNA1, DICER1*, EPCAM*, GREM1*,  HOXB13, KIT, MEN1*, MITF*, MLH1*, MSH2*, MSH3*, MSH6*, MUTYH, NBN, NF1*, NTHL1, PALB2, PDGFRA, PMS2*, POLD1*, POLE, POT1, PTCH1, PTEN*, RAD50, RAD51C, RAD51D, RB1*, RNF43, SDHA*, SDHB, SDHC*, SDHD, SMAD4, SMARCA4, STK11, SUFU, TP53, TSC1*, TSC2, VHL.    05/07/2020 Cancer Staging   Staging form: Breast, AJCC 8th Edition - Clinical: Stage IIIB (cT2, cN1, cM0, G3, ER-, PR-, HER2-) - Signed by Cammie Sickle, MD on 05/07/2020   04/18/2021 Cancer Staging   Staging form: Breast, AJCC 8th Edition - Pathologic: Stage IV (pT4d, pN3c, cM1, ER+, PR-, HER2-) - Signed by Cammie Sickle, MD on 04/18/2021 Nuclear grade: G3     Interval history- Patient is 71 year old female diagnosed with metastatic breast cancer, currently receiving trodelvy who presents to [facility] for anxiety and ongoing numb mouth. She was seen in ER last week and diagnosed with metastatic disease of mandible. She continues radiation to hip. Pain has been well controlled but today, she has not had any hydrocodone and pain is worse. REquests pain medication. Anxiety is worse. Not sleeping d/t symptoms. Interferes with quality of life and social relationships. Mouth is not painful but does limit chewing hard/solid foods at times. Uses a straw with success.   ECOG FS:2 - Symptomatic, <50% confined to bed  Review of systems- Review of Systems  Constitutional:  Positive for malaise/fatigue. Negative for chills, fever and weight loss.  HENT:  Negative for hearing loss, nosebleeds, sore throat and tinnitus.   Eyes:  Negative for blurred vision and double vision.  Respiratory:  Negative for cough, hemoptysis, shortness of breath and wheezing.   Cardiovascular:  Negative for chest pain, palpitations and leg swelling.  Gastrointestinal:  Negative for abdominal pain, blood in stool,  constipation, diarrhea, melena, nausea and vomiting.  Genitourinary:  Negative for dysuria and urgency.  Musculoskeletal:  Negative for back pain, falls, joint pain and myalgias.  Skin:  Negative for itching and rash.  Neurological:  Negative for dizziness, tingling, sensory change, loss of consciousness, weakness and headaches.  Endo/Heme/Allergies:  Negative for environmental allergies. Does not bruise/bleed easily.  Psychiatric/Behavioral:  Negative for depression. The patient is nervous/anxious and has insomnia.      No Known Allergies  Past Medical History:  Diagnosis Date   Cancer ([place]) 2021   right breast   Cyst, breast    benign   Diabetes mellitus without complication ([place])    Family history of melanoma    Family history of ovarian cancer    GERD (gastroesophageal reflux disease)    H/O: rheumatic fever    History of colonoscopy 2010   done bc of bleeding,  normal , due back in 5 yrs ([place])   Hyperlipidemia    Hypertension    Menopause    at age 26   Personal history of chemotherapy 09/2019   RIGHT  INVASIVE MAMMARY CARCINOMA   Personal history of radiation therapy     Past Surgical History:  Procedure Laterality Date   APPENDECTOMY  2006   BREAST BIOPSY Right 09/22/2019   INVASIVE MAMMARY CARCINOMA   BREAST CYST ASPIRATION Left 1999   BREAST LUMPECTOMY Left 04/23/2020   surgery with NL and SN    BREAST LUMPECTOMY WITH NEEDLE LOCALIZATION Right 04/23/2020   Procedure: BREAST LUMPECTOMY WITH NEEDLE LOCALIZATION;  Surgeon: Robert Bellow, MD;  Location: [facility] ORS;  Service: General;  Laterality: Right;   BREAST LUMPECTOMY WITH SENTINEL LYMPH NODE BIOPSY Right 04/23/2020   Procedure: BREAST LUMPECTOMY WITH SENTINEL LYMPH NODE BX;  Surgeon: Robert Bellow, MD;  Location: [facility] ORS;  Service: General;  Laterality: Right;   CHOLECYSTECTOMY  2006   COLONOSCOPY     PORTACATH PLACEMENT Left 10/06/2019   Procedure: INSERTION PORT-A-CATH;  Surgeon: Robert Bellow, MD;  Location: [facility] ORS;  Service: General;  Laterality: Left;   SUBMUCOSAL TATTOO INJECTION Right 10/06/2019   Procedure: Right axillary TATTOO INJECTION;  Surgeon: Robert Bellow, MD;  Location: [facility] ORS;  Service: General;  Laterality: Right;   VAGINAL DELIVERY     x3    Social History   Socioeconomic History   Marital status: Married    Spouse name: Chrissie Noa   Number of children: 3   Years of education: Not on file   Highest education level: Not on file  Occupational History   Occupation: Glass blower/designer: [id number]  Tobacco Use   Smoking status: Former    Years: 1.00    Types: Cigarettes    Quit date: 12/26/2005    Years since quitting: 16.1   Smokeless tobacco: Never   Tobacco comments:    smoked for less than 1 years,  1 cig/day  Vaping Use   Vaping Use: Never used  Substance and Sexual Activity   Alcohol use: No   Drug use: No   Sexual activity: Never    Partners: Female  Other Topics Concern   Not on file  Social History Narrative   Widowed, March 2014; remarried.      [employer] ret[employer]; quit smoking 1ppw; no alcohol.       Social Determinants of Health   Financial Resource Strain: Medium Risk (05/25/2021)   Overall Financial Resource Strain (CARDIA)    Difficulty of Paying Living Expenses: Somewhat hard  Food Insecurity: No Food Insecurity (05/18/2021)   Hunger Vital Sign    Worried About Running Out of Food in the Last Year: Never true    Ran Out of Food in the Last Year: Never true  Transportation Needs: No Transportation Needs (05/18/2021)   PRAPARE - [place]rologist (Medical): No    Lack of Transportation (Non-Medical): No  Physical Activity: Inactive (05/18/2021)   Exercise Vital Sign    Days of Exercise per Week: 0 days    Minutes of Exercise per Session: 0 min  Stress: No Stress Concern Present (05/18/2021)   [place]    Feeling of Stress : Not  at all  Social Connections: [place] (05/18/2021)   Social Connection and Isolation Panel [NHANES]    Frequency of Communication with Friends and Family: More than three times a week    Frequency of Social Gatherings with Friends and Family: More than three times a week    Attends Religious Services: More than 4 times per year    Active Member of [employer] or Organizations: Yes    Attends Music therapist: More than 4 times per year    Marital Status: Married  Human resources officer Violence: Not At Risk (05/04/2021)   Humiliation, Afraid, Rape, and Kick questionnaire    Fear of Current or Ex-Partner: No    Emotionally Abused: No    Physically Abused: No    Sexually Abused: No    Family History  Problem Relation Age of Onset   Cancer Mother 7       ovarian- died 4-5 years.    Heart disease Mother 82   Diabetes Mother    Stroke Father 2  Diabetes Father    Diabetes Sister    Melanoma Sister        survived   Breast cancer Neg Hx      Current Outpatient Medications:    calcium carbonate (OS-CAL) 600 MG TABS tablet, Take 600 mg by mouth daily., Disp: , Rfl:    Continuous Blood Gluc Sensor (FREESTYLE LIBRE 3 SENSOR) MISC, Apply 1 each topically every 14 (fourteen) days. Place 1 sensor on the skin every 14 days. Use to check glucose continuously, Disp: 6 each, Rfl: 3   dicyclomine (BENTYL) 20 MG tablet, Take 1 tablet (20 mg total) by mouth every 8 (eight) hours as needed for spasms., Disp: 30 tablet, Rfl: 0   diphenoxylate-atropine (LOMOTIL) 2.5-0.025 MG tablet, Take 1 tablet by mouth 4 (four) times daily as needed for diarrhea or loose stools. Take it along with immodium, Disp: 60 tablet, Rfl: 0   gabapentin (NEURONTIN) 100 MG capsule, TAKE 2 CAPSULES BY MOUTH AT BEDTIME, Disp: 180 capsule, Rfl: 0   glipiZIDE (GLUCOTROL XL) 10 MG 24 hr tablet, Take 1 tablet by mouth once daily with breakfast, Disp: 90 tablet, Rfl: 0   HYDROcodone-acetaminophen (NORCO) 10-325 MG  tablet, Take 1 tablet by mouth every 4 (four) hours as needed., Disp: 60 tablet, Rfl: 0   KLOR-CON M20 20 MEQ tablet, Take 1 tablet by mouth twice daily, Disp: 60 tablet, Rfl: 0   levothyroxine (SYNTHROID) 100 MCG tablet, TAKE 1 TABLET BY MOUTH ONCE DAILY IN THE MORNING BEFORE BREAKFAST, Disp: 90 tablet, Rfl: 0   lidocaine-prilocaine (EMLA) cream, Apply 1 application topically as needed. Apply to port and cover with saran wrap 1-2 hours prior to port access, Disp: 30 g, Rfl: 1   losartan-hydrochlorothiazide (HYZAAR) 50-12.5 MG tablet, Take 1 tablet by mouth once daily, Disp: 90 tablet, Rfl: 0   lovastatin (MEVACOR) 40 MG tablet, TAKE 1 TABLET BY MOUTH AT BEDTIME, Disp: 90 tablet, Rfl: 0   Magnesium Cl-Calcium Carbonate (SLOW MAGNESIUM/CALCIUM) 70-117 MG TBEC, Take 1 tablet by mouth 2 (two) times daily., Disp: 60 tablet, Rfl: 6   metFORMIN (GLUCOPHAGE) 1000 MG tablet, TAKE 2 TABLETS BY MOUTH ONCE DAILY WITH BREAKFAST, Disp: 180 tablet, Rfl: 0   morphine (MS CONTIN) 15 MG 12 hr tablet, Take 1 tablet (15 mg total) by mouth every 12 (twelve) hours., Disp: 60 tablet, Rfl: 0   Multiple Vitamin (MULTIVITAMIN) tablet, Take 1 tablet by mouth daily., Disp: , Rfl:    omeprazole (PRILOSEC) 20 MG capsule, Take 1 capsule (20 mg total) by mouth daily., Disp: 90 capsule, Rfl: 3   ondansetron (ZOFRAN) 8 MG tablet, One pill every 8 hours as needed for nausea/vomitting., Disp: 40 tablet, Rfl: 1   prochlorperazine (COMPAZINE) 10 MG tablet, Take 1 tablet (10 mg total) by mouth every 6 (six) hours as needed for nausea or vomiting., Disp: 40 tablet, Rfl: 1   Semaglutide,0.25 or 0.5MG/DOS, (OZEMPIC, 0.25 OR 0.5 MG/DOSE,) 2 MG/1.5ML SOPN, Inject 0.5 mg into the skin once a week., Disp: 1.5 mL, Rfl: 11   vitamin B-12 (CYANOCOBALAMIN) 100 MCG tablet, Take 100 mcg by mouth daily., Disp: , Rfl:  No current facility-administered medications for this visit.  Facility-Administered Medications Ordered in Other Visits:    heparin  lock flush 100 unit/mL, 500 Units, Intravenous, Once, Verlon Au, NP   sodium chloride flush (NS) 0.9 % injection 10 mL, 10 mL, Intravenous, Once, Verlon Au, NP  Physical exam:  Vitals:   02/14/22 0956  BP: 122/64  Pulse:  92  Temp: 97.7 F (36.5 C)  TempSrc: Tympanic  SpO2: 97%  Weight: 163 lb (73.9 kg)   Physical Exam Constitutional:      General: She is not in acute distress.    Appearance: She is well-developed. She is not ill-appearing.  HENT:     Mouth/Throat:     Pharynx: No oropharyngeal exudate.  Eyes:     General: No scleral icterus. Cardiovascular:     Rate and Rhythm: Normal rate and regular rhythm.  Pulmonary:     Effort: Pulmonary effort is normal.     Breath sounds: Normal breath sounds.  Abdominal:     General: There is no distension.     Palpations: Abdomen is soft.     Tenderness: There is no abdominal tenderness. There is no rebound.  Musculoskeletal:        General: No tenderness or deformity.     Right lower leg: No edema.     Left lower leg: No edema.  Skin:    General: Skin is warm and dry.     Coloration: Skin is not pale.  Neurological:     General: No focal deficit present.     Mental Status: She is alert and oriented to person, place, and time.     Motor: No weakness.  Psychiatric:        Mood and Affect: Mood normal.        Behavior: Behavior normal.         Latest Ref Rng & Units 02/10/2022   12:43 PM  CMP  Glucose 70 - 99 mg/dL 209   BUN 8 - 23 mg/dL 15   Creatinine 0.44 - 1.00 mg/dL 0.67   Sodium 135 - 145 mmol/L 135   Potassium 3.5 - 5.1 mmol/L 4.2   Chloride 98 - 111 mmol/L 101   CO2 22 - 32 mmol/L 25   Calcium 8.9 - 10.3 mg/dL 8.2   Total Protein 6.5 - 8.1 g/dL 6.7   Total Bilirubin 0.3 - 1.2 mg/dL 0.6   Alkaline Phos 38 - 126 U/L 671   AST 15 - 41 U/L 126   ALT 0 - 44 U/L 28       Latest Ref Rng & Units 02/10/2022   12:43 PM  CBC  WBC 4.0 - 10.5 K/uL 3.4   Hemoglobin 12.0 - 15.0 g/dL 8.2   Hematocrit  36.0 - 46.0 % 25.7   Platelets 150 - 400 K/uL 54     MR BRAIN W WO CONTRAST  Result Date: 02/10/2022 CLINICAL DATA:  Stage IV cancer with chin numbness. EXAM: MRI HEAD WITHOUT AND WITH CONTRAST MRI FACE TRIGEMINAL WITHOUT AND WITH CONTRAST TECHNIQUE: Multiplanar, multi-echo pulse sequences of the face and surrounding structures, including thin-slice imaging of the trigeminal nerves, were acquired before and after intravenous contrast administration. Multiplanar, multiecho pulse sequences of the brain and surrounding structures were obtained without and with intravenous contrast. CONTRAST:  7.1m GADAVIST GADOBUTROL 1 MMOL/ML IV SOLN COMPARISON:  CT head 02/10/2022 and CT neck 10/21/2021 FINDINGS: MR HEAD: Brain: There is no acute intracranial hemorrhage, extra-axial fluid collection, or acute infarct. Parenchymal volume is normal. The ventricles are normal in size. Gray-white differentiation is preserved. Parenchymal signal is normal, with no significant burden of underlying chronic small-vessel ischemic change. There is no mass lesion. There is no mass effect or midline shift. There is no abnormal enhancement. There is no evidence of nose and meningeal metastatic disease. Vascular: Normal flow voids. Skull and upper  cervical spine: There is extensive signal abnormality throughout the calvarium, mandible, and upper cervical spine consistent with extensive osseous metastatic disease. Sinuses/Orbits: The paranasal sinuses are clear. The globes and orbits are unremarkable. Other: None MR FACE: There is extensive signal abnormality in the mandible bilaterally consistent with osseous metastatic disease. There is enhancement within the soft tissues of the opening of the right mandibular nerve canal (11-17). There is no other convincing evidence of soft tissue tumor or perineural spread of tumor. IMPRESSION: 1. Diffusely abnormal bone marrow consistent with extensive osseous metastatic disease which involves most of  the mandible and skull base. 2. Small focus of soft tissue enhancement adjacent to the right mandibular foramen concerning for perineural spread along the mandibular nerve. No other convincing evidence of soft tissue tumor or perineural spread of tumor. 3. No acute intracranial pathology or evidence of intracranial metastatic disease. Electronically Signed   By: Valetta Mole M.D.   On: 02/10/2022 16:35   MR FACE/TRIGEMINAL WO/W CM  Result Date: 02/10/2022 CLINICAL DATA:  Stage IV cancer with chin numbness. EXAM: MRI HEAD WITHOUT AND WITH CONTRAST MRI FACE TRIGEMINAL WITHOUT AND WITH CONTRAST TECHNIQUE: Multiplanar, multi-echo pulse sequences of the face and surrounding structures, including thin-slice imaging of the trigeminal nerves, were acquired before and after intravenous contrast administration. Multiplanar, multiecho pulse sequences of the brain and surrounding structures were obtained without and with intravenous contrast. CONTRAST:  7.59m GADAVIST GADOBUTROL 1 MMOL/ML IV SOLN COMPARISON:  CT head 02/10/2022 and CT neck 10/21/2021 FINDINGS: MR HEAD: Brain: There is no acute intracranial hemorrhage, extra-axial fluid collection, or acute infarct. Parenchymal volume is normal. The ventricles are normal in size. Gray-white differentiation is preserved. Parenchymal signal is normal, with no significant burden of underlying chronic small-vessel ischemic change. There is no mass lesion. There is no mass effect or midline shift. There is no abnormal enhancement. There is no evidence of nose and meningeal metastatic disease. Vascular: Normal flow voids. Skull and upper cervical spine: There is extensive signal abnormality throughout the calvarium, mandible, and upper cervical spine consistent with extensive osseous metastatic disease. Sinuses/Orbits: The paranasal sinuses are clear. The globes and orbits are unremarkable. Other: None MR FACE: There is extensive signal abnormality in the mandible bilaterally  consistent with osseous metastatic disease. There is enhancement within the soft tissues of the opening of the right mandibular nerve canal (11-17). There is no other convincing evidence of soft tissue tumor or perineural spread of tumor. IMPRESSION: 1. 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 2. Small focus of soft tissue enhancement adjacent to the right mandibular foramen concerning for perineural spread along the mandibular nerve. No other convincing evidence of soft tissue tumor or perineural spread of tumor. 3. No acute intracranial pathology or evidence of intracranial metastatic disease. Electronically Signed   By: PValetta MoleM.D.   On: 02/10/2022 16:35   CT HEAD WO CONTRAST  Result Date: 02/10/2022 CLINICAL DATA:  Numbness EXAM: CT HEAD WITHOUT CONTRAST TECHNIQUE: Contiguous axial images were obtained from the base of the skull through the vertex without intravenous contrast. RADIATION DOSE REDUCTION: This exam was performed according to the departmental dose-optimization program which includes automated exposure control, adjustment of the mA and/or kV according to patient size and/or use of iterative reconstruction technique. COMPARISON:  12/02/2010 FINDINGS: Brain: No acute intracranial findings are seen. There are no signs of bleeding within the cranium. Calcifications are seen in basal ganglia. There is  slight prominence of lateral ventricles. Cortical sulci are prominent. Vascular: Unremarkable. Skull: Unremarkable. Sinuses/Orbits: Unremarkable. Other: None. IMPRESSION: No acute intracranial findings are seen in noncontrast CT brain. Electronically Signed   By: Elmer Picker M.D.   On: 02/10/2022 13:44    Assessment and plan- Patient is a 71 y.o. female with metastatic breast cancer currently receiving trodelvy, s/p cycle 2 on 02/07/22 who [facility] for   Anxiety-  start duloxetine 30 mg daily x 2-4 weeks. If tolerating, plan to increase to 60 mg daily. Reviewed risks and slow onset. She can continue alprazolam 0.25 mg TID as needed for break through symptoms.  Jaw numbness- concern that this is related to disease seen on recent MRI. Findings concerning for progressive disease. Unfortunately, this clinical symptoms is considered poor prognostic indicator. Poor intake. Plan for IV fluids today.  Hip pain/bone metastases- currently receiving radiation to hip. Pain is stable. Poorly controlled today however. Worse with transport. Will give pain medication in clinic today.  Metastatic breast cancer- given progression on MRI, recommend CT C/A/P to evaluate for progressive disease elsewhere.  Shortness of breath- likely multifactorial- anemia (worse), question disease progression, & anxiety.  Anemia- worse. Will check ferritin and iron studies today. These were normal. No role for Iron. Monitor.  Goals of care- Patient expresses that she is unsure if she would consider additional treatments. She's worried about getting through holidays. Will give her a short course of prednisone for energy and appetite. I have also asked Billey Chang, NP to see patient for symptom management today as well.     Disposition: CT C/A/P asap Add on ferritin and iron studies See Dr Rogue Bussing for results- la  Visit Diagnosis 1. Carcinoma of right breast metastatic to bone ([place])   2. Neoplasm related pain   3. Anemia due to antineoplastic chemotherapy   4. Anxiety associated with cancer diagnosis [facility])    Patient expressed understanding and was in agreement with this plan. She also understands that She can call clinic at any time with any questions, concerns, or complaints.   Thank you for allowing me to participate in the care of this very pleasant patient.   Beckey Rutter, DNP, AGNP-C [place] [facility] [place]

## 2022-02-14 NOTE — Progress Notes (Signed)
Nutrition Follow-up:  Patient with triple negative recurrent breast cancer.  Chemotherapy and immunotherapy on hold due to poor tolerance.  Receiving radiation.  Noted ER visit.    Met with patient briefly during fluids.  Patient reports that she has been eating softer foods (puddings).  Continues to have taste alterations.     Medications: xanax, prednisone added today  Labs: reviewed  Anthropometrics:   Weight 164 lb 4.8 oz today  176 lb 3.2 oz 187 lb on 6/26   NUTRITION DIAGNOSIS: Inadequate oral intake continues    INTERVENTION:  Discussed soft, moist foods.  Handout provided Reviewed strategies to help with taste change.   Discussed smoothie options.      MONITORING, EVALUATION, GOAL: weight trends, intake   NEXT VISIT: as needed  Amaree Leeper B. Zenia Resides, [place], [place] Registered Dietitian [phone]

## 2022-02-14 NOTE — Progress Notes (Signed)
Spoke with patient's husband.  Patient was currently sleeping.  Based on Beckey Rutter, NP this morning with adjustment to her medications for symptom management.  Husband had a couple questions regarding medication administration which we discussed in detail.  Patient is pending CTs for further evaluation and I will have follow-up next week with Dr. Rogue Bussing.

## 2022-02-14 NOTE — Progress Notes (Signed)
Patient complains of being anxious, irritable, can't sleep.

## 2022-02-15 ENCOUNTER — Other Ambulatory Visit: Payer: Self-pay

## 2022-02-15 ENCOUNTER — Ambulatory Visit
Admission: RE | Admit: 2022-02-15 | Discharge: 2022-02-15 | Disposition: A | Discharge status: Home / Self Care | Payer: PPO | Source: Ambulatory Visit | Attending: Radiation Oncology | Admitting: Radiation Oncology

## 2022-02-15 DIAGNOSIS — Z510 Encounter for antineoplastic radiation therapy: Secondary | ICD-10-CM | POA: Diagnosis not present

## 2022-02-15 LAB — RAD ONC ARIA SESSION SUMMARY
Course Elapsed Days: 9
Plan Fractions Treated to Date: 8
Plan Prescribed Dose Per Fraction: 3 Gy
Plan Total Fractions Prescribed: 10
Plan Total Prescribed Dose: 30 Gy
Reference Point Dosage Given to Date: 24 Gy
Reference Point Session Dosage Given: 3 Gy
Session Number: 8

## 2022-02-20 ENCOUNTER — Ambulatory Visit: Payer: PPO

## 2022-02-20 ENCOUNTER — Other Ambulatory Visit: Payer: Self-pay | Admitting: *Deleted

## 2022-02-20 DIAGNOSIS — Z171 Estrogen receptor negative status [ER-]: Secondary | ICD-10-CM

## 2022-02-20 MED FILL — Dexamethasone Sodium Phosphate Inj 100 MG/10ML: INTRAMUSCULAR | Qty: 1 | Status: AC

## 2022-02-20 MED FILL — Fosaprepitant Dimeglumine For IV Infusion 150 MG (Base Eq): INTRAVENOUS | Qty: 5 | Status: AC

## 2022-02-21 ENCOUNTER — Inpatient Hospital Stay (HOSPITAL_BASED_OUTPATIENT_CLINIC_OR_DEPARTMENT_OTHER): Payer: PPO | Admitting: Internal Medicine

## 2022-02-21 ENCOUNTER — Ambulatory Visit: Payer: PPO

## 2022-02-21 ENCOUNTER — Inpatient Hospital Stay: Payer: PPO

## 2022-02-21 ENCOUNTER — Other Ambulatory Visit: Payer: Self-pay | Admitting: *Deleted

## 2022-02-21 ENCOUNTER — Encounter: Payer: Self-pay | Admitting: Internal Medicine

## 2022-02-21 ENCOUNTER — Other Ambulatory Visit: Payer: Self-pay | Admitting: Internal Medicine

## 2022-02-21 ENCOUNTER — Telehealth: Payer: Self-pay

## 2022-02-21 ENCOUNTER — Ambulatory Visit
Admission: RE | Admit: 2022-02-21 | Discharge: 2022-02-21 | Disposition: A | Discharge status: Home / Self Care | Payer: PPO | Source: Ambulatory Visit | Attending: Radiation Oncology | Admitting: Radiation Oncology

## 2022-02-21 ENCOUNTER — Other Ambulatory Visit: Payer: Self-pay

## 2022-02-21 VITALS — BP 112/68 | HR 90 | Temp 98.2°F | Resp 16 | Ht 67.0 in | Wt 167.2 lb

## 2022-02-21 DIAGNOSIS — C50211 Malignant neoplasm of upper-inner quadrant of right female breast: Secondary | ICD-10-CM

## 2022-02-21 DIAGNOSIS — D4861 Neoplasm of uncertain behavior of right breast: Secondary | ICD-10-CM | POA: Diagnosis not present

## 2022-02-21 DIAGNOSIS — Z171 Estrogen receptor negative status [ER-]: Secondary | ICD-10-CM

## 2022-02-21 DIAGNOSIS — Z510 Encounter for antineoplastic radiation therapy: Secondary | ICD-10-CM | POA: Diagnosis not present

## 2022-02-21 LAB — RAD ONC ARIA SESSION SUMMARY
Course Elapsed Days: 15
Plan Fractions Treated to Date: 9
Plan Prescribed Dose Per Fraction: 3 Gy
Plan Total Fractions Prescribed: 10
Plan Total Prescribed Dose: 30 Gy
Reference Point Dosage Given to Date: 27 Gy
Reference Point Session Dosage Given: 3 Gy
Session Number: 9

## 2022-02-21 LAB — COMPREHENSIVE METABOLIC PANEL
ALT: 17 U/L (ref 0–44)
AST: 52 U/L — ABNORMAL HIGH (ref 15–41)
Albumin: 2.5 g/dL — ABNORMAL LOW (ref 3.5–5.0)
Alkaline Phosphatase: 323 U/L — ABNORMAL HIGH (ref 38–126)
Anion gap: 10 (ref 5–15)
BUN: 14 mg/dL (ref 8–23)
CO2: 24 mmol/L (ref 22–32)
Calcium: 8.7 mg/dL — ABNORMAL LOW (ref 8.9–10.3)
Chloride: 97 mmol/L — ABNORMAL LOW (ref 98–111)
Creatinine, Ser: 0.53 mg/dL (ref 0.44–1.00)
GFR, Estimated: >60 mL/min (ref >60)
Glucose, Bld: 199 mg/dL — ABNORMAL HIGH (ref 70–99)
Potassium: 3.8 mmol/L (ref 3.5–5.1)
Sodium: 131 mmol/L — ABNORMAL LOW (ref 135–145)
Total Bilirubin: 0.6 mg/dL (ref 0.3–1.2)
Total Protein: 6.5 g/dL (ref 6.5–8.1)

## 2022-02-21 LAB — CBC WITH DIFFERENTIAL/PLATELET
Abs Immature Granulocytes: 0.19 10*3/uL — ABNORMAL HIGH (ref 0.00–0.07)
Basophils Absolute: 0 10*3/uL (ref 0.0–0.1)
Basophils Relative: 1 %
Eosinophils Absolute: 0.1 10*3/uL (ref 0.0–0.5)
Eosinophils Relative: 2 %
HCT: 19.8 % — ABNORMAL LOW (ref 36.0–46.0)
Hemoglobin: 6.4 g/dL — ABNORMAL LOW (ref 12.0–15.0)
Immature Granulocytes: 6 %
Lymphocytes Relative: 28 %
Lymphs Abs: 0.9 10*3/uL (ref 0.7–4.0)
MCH: 27.7 pg (ref 26.0–34.0)
MCHC: 32.3 g/dL (ref 30.0–36.0)
MCV: 85.7 fL (ref 80.0–100.0)
Monocytes Absolute: 0.4 10*3/uL (ref 0.1–1.0)
Monocytes Relative: 12 %
Neutro Abs: 1.7 10*3/uL (ref 1.7–7.7)
Neutrophils Relative %: 51 %
Platelets: 46 10*3/uL — ABNORMAL LOW (ref 150–400)
RBC: 2.31 MIL/uL — ABNORMAL LOW (ref 3.87–5.11)
RDW: 21.1 % — ABNORMAL HIGH (ref 11.5–15.5)
Smear Review: NORMAL
WBC: 3.3 10*3/uL — ABNORMAL LOW (ref 4.0–10.5)
nRBC: 9.7 % — ABNORMAL HIGH (ref 0.0–0.2)

## 2022-02-21 LAB — SAMPLE TO BLOOD BANK

## 2022-02-21 LAB — PREPARE RBC (CROSSMATCH)

## 2022-02-21 LAB — MAGNESIUM: Magnesium: 1.9 mg/dL (ref 1.7–2.4)

## 2022-02-21 MED ORDER — SODIUM CHLORIDE 0.9% FLUSH
10.0000 mL | Freq: Once | INTRAVENOUS | Status: AC
  Administered 2022-02-21: 10 mL via INTRAVENOUS
  Filled 2022-02-21: qty 10

## 2022-02-21 MED ORDER — OLANZAPINE 10 MG PO TABS: 10.0000 mg | ORAL_TABLET | Freq: Every day | ORAL | 2 refills | Status: AC

## 2022-02-21 MED ORDER — HEPARIN SOD (PORK) LOCK FLUSH 100 UNIT/ML IV SOLN
500.0000 [IU] | Freq: Once | INTRAVENOUS | Status: AC
  Filled 2022-02-21: qty 5

## 2022-02-21 MED ORDER — OLANZAPINE 5 MG PO TABS: 10.0000 mg | ORAL_TABLET | Freq: Every day | ORAL | 2 refills | Status: DC

## 2022-02-21 NOTE — Telephone Encounter (Addendum)
Received call from Sherrie [email or web address] stating that they received referral for hospice from Dr.Brahmanday's ofc & they are asking to see if PCP would be agreeable to being pt's hospice attending & to determine if pt has <6 mon to live. Sherrie can be reached '@[phone]

## 2022-02-21 NOTE — Assessment & Plan Note (Addendum)
#Recurrent right breast cancer-s/p breast skin biopsy [FEB 2023- ER positive /PR HER2-0; however 2017-ER-NEGATIVE; -HER2 NEU-1+/LOW]; - recurrent- stage IV; NGS -FEB 2023- CPS =15%. Currently on Keytruda- taxol [2/13]; BONE SCAN- November 24, 2021- Scattered sites of abnormal osseous tracer uptake  consistent with osseous metastases, including BILATERAL femora.   12/02/2021- PET SCAN-  Three hypermetabolic right breast nodules with a right supraclavicular nodal metastasis and diffuse osseous metastatic disease.  Subpleural nodular consolidation in the apical left upper lobe is new from 10/21/2021 and may be infectious/inflammatory in etiology or related to evolving radiation therapy.   #Patient currently  S/p  Sacituzumab govitecan HOLD chemotherapy cycle number 3 day 15.  Patient tolerating chemotherapy with mild to moderate difficulty- see below-diarrhea/fatigue/poor taste/worsening pain weight loss. CT scan NOV 2023-shows progressive disease in the breast and also right supraclavicular lymph node; possible liver lesion and also retrocrural/retroperitoneal soft tissue subcentimeter masses noted.  All concerning for progressive disease.   # Given the poor tolerance; and the progressive disease recommend discontinuation of Sacituzumab therapy.  Discussed options-# holding therapy-and reassess in the next 4 weeks or so for consideration of ongoing chemotherapy [discussed with family/patient that this is statistically less likely as patient is progressive disease].  Also discussed regarding hospice.  Patient interested in hospice referral at this time.  # Right facial numbness- NOV 17th MRI Brain/mandible-  Diffusely abnormal bone marrow consistent with extensive osseous metastatic disease which involves most of the mandible and skull base;  Small focus of soft tissue enhancement adjacent to the right mandibular foramen concerning for perineural spread along the mandibular nerve. No other convincing evidence of  soft tissue tumor or perineural spread of tumor. No acute intracranial pathology or evidence of intracranial metastatic disease.  Discussed with Dr. Carlynn Spry the lack of pain; and the risk of mucositis hold off any radiation at this time.  He also agrees with hospice.  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  Continue fentanyl patch to 50 mcg.   #Fatigue multifactorial-anemia hemoglobin 6.4-secondary chemotherapy.  Proceed with PRBC transfusion tomorrow.  Ordered.  # Diarrhea- G-1- recommend continue lomotil as planned.   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   # PN-1-2 sec to taxol-on Neurontin 200 qhs-STABLE.   # Weight loss/lack of appetite/difficulty sleeping at night-recommend olanzapine 5 mg nightly.  New prescription sent.  # Prognosis: I had a long discussion with the patient and her husband regarding her overall poor prognosis-and the fact that patient has progressive disease in the context of poor tolerance to therapy.  Discussed at length regarding the fact that patient will likely not be strong enough to proceed with any further therapies.  Discussed regarding hospice; will make a hospice referral.  I offered to speak to patient's rest of the family; they declined at this time.  # Mediport: Functioning.   # DISPOSITION: # No Chemo today; keep the port access # Hospice referral re: Breast cancer # 1 unit PRBC tomorrow # Follow up in 4 weeks- MD; labs- cbc/cmp; magnesium; NO chemo- Dr.B  # I reviewed the blood work- with the patient in detail; also reviewed the imaging independently [as summarized above]; and with the patient in detail.     #  40 minutes face-to-face with the patient discussing the above plan of care; more than 50% of time spent on prognosis/ natural history; counseling and coordination.

## 2022-02-21 NOTE — Progress Notes (Signed)
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 Byrnett.] [id number] Carbo-taxol-AC;  JAN 2022-s/p Lumpectomy & ALND- [ypT1a (65m ) ypN1 (2/11 LN-positive)]; s/p radiation end of April 2022.  #Early June 2022-Xeloda 1000 mg per metered square [2000] 2 weeks on 1 week of 6-8 cycles; July 11th 2022- STARTING cycle #3-cut down the dose to 1500 mg BID[sec to HFS].   # JAN 2023- A. SKIN, RIGHT BREAST; PUNCH BIOPSY: [Dr.Byrnett] - INVASIVE CARCINOMA WITH DERMAL INFILTRATION AND LYMPHATIC INVASION. ER- weak POSISTIVE [10%]; PR/her2 NEG [[facility] 0]; JAN 20th, 2023- IMPRESSION: 1. Although the hypermetabolic right axillary node has resolved since the prior PET, there is a new hypermetabolic right low cervical/supraclavicular node, suspicious for nodal metastasis. 2. Decrease in right breast hypermetabolism with nonspecific overlying skin thickening in the setting of prior lumpectomy. 3. Low-level hypermetabolism within the T2 vertebral body, suspicious for isolated osseous metastasis. This could be confirmed with pre and postcontrast thoracic spine MRI. 4. Hypermetabolic thoracic nodes, slightly progressive. Given progression, nodal metastasis are slightly favored over reactive etiologies. 5. Incidental findings, including: Aortic Atherosclerosis (ICD10-I70.0). Tiny hiatal hernia. Hepatic steatosis with nonspecific caudate lobe enlargemen  Comment:  The carcinoma is  morphologically similar to the post-neoadjuvant mammary  carcinoma seen in the January 2022 right breast wide excision and lymph  Nodes  # FEB 6th-Taxol single agent #1; FEB 13th, 2023- [CPS =15]; Taxol-Keytruda  MAY 2023- CT CHEST:  Since the PET of 04/15/2021, enlargement of the previously described hypermetabolic right low cervical/supraclavicular nodal metastasis. Foci of hyperenhancement within the right breast are new or more well-defined since 02/02/2021, suspicious for progressive residual/recurrent disease. Nonspecific mediastinal nodes, similar to slightly decreased in size;  New sclerosis at the site of a known T2 vertebral body metastasis.   # s/p palliative radiation therapy to her right supraclavicular nodal metastasis as well as T2 vertebral body [30 Gray in 10 fractions ;[5/31 to  6/13].  CONTINUED TAXOL-KEYTRUDA; SEP 6th 2023-bone scan progressive disease.  Discontinue Taxol-Keytruda  #  SEP 19th 2023- SACITUZUMAB GOVITECAN [at 80% dose]   DIAGNOSIS: Right breast cancer triple negative      Carcinoma of upper-inner quadrant of right breast in female, estrogen receptor negative ([place]  10/07/2019 Initial Diagnosis   Carcinoma of upper-inner quadrant of right breast in female, estrogen receptor negative ([place]   12/04/2019 Genetic Testing   Negative genetic testing. No pathogenic variants identified on the [facility] Common Hereditary Cancers Panel + Skin Cancers Panel. The report date is 12/04/2019.  The Common Hereditary Cancers Panel + Skin Cancers Panel offered by [facility] includes sequencing and/or deletion duplication testing of the following 54 genes: APC*, ATM*, AXIN2, BAP1, BARD1, BMPR1A, BRCA1, BRCA2, BRIP1, CDH1, CDK4, CDKN2A (p14ARF), CDKN2A (p16INK4a), CHEK2, CTNNA1, DICER1*, EPCAM*, GREM1*, HOXB13, KIT, MEN1*, MITF*, MLH1*, MSH2*, MSH3*, MSH6*, MUTYH, NBN, NF1*, NTHL1, PALB2, PDGFRA, PMS2*, POLD1*, POLE, POT1, PTCH1, PTEN*, RAD50, RAD51C, RAD51D, RB1*, RNF43, SDHA*, SDHB,  SDHC*, SDHD, SMAD4, SMARCA4, STK11, SUFU, TP53, TSC1*, TSC2, VHL.    05/07/2020 Cancer Staging   Staging form: Breast, AJCC 8th Edition -  Clinical: Stage IIIB (cT2, cN1, cM0, G3, ER-, PR-, HER2-) - Signed by Cammie Sickle, MD on 05/07/2020   04/18/2021 Cancer Staging   Staging form: Breast, AJCC 8th Edition - Pathologic: Stage IV (pT4d, pN3c, cM1, ER+, PR-, HER2-) - Signed by Cammie Sickle, MD on 04/18/2021 Nuclear grade: G3      HISTORY OF PRESENTING ILLNESS: Accompanied by husband.  Walking independently.  Ashlee Cox 71 y.o.  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  In the interim patient was evaluated in the emergency room for facial numbness-MRI suggestive of involvement of the mandible with malignancy. Patient received cycle number 3-day 15 Saci approximately 2 weeks ago.  Patient having difficulty with deep breathing.  Still having chin numbness.  Not sleeping well and not taking anything to help and wanted to talk with Dr. B about what she can take with her other medications.  No appetite with continued change in taste. Now taking Alprazolam 0.25 mg tid that is helping with anxiousness.     Pain is well controlled with current regimen.  Patient hip pain is improved.  She is walking independently.  She is taking hydrocodone once a day as needed.  She is on the fentanyl patch.  D Complains of mild to moderate fatigue.  Admits to compliance with her Synthroid.  Review of Systems  Constitutional:  Positive for malaise/fatigue. Negative for chills, diaphoresis, fever and weight loss.  HENT:  Negative for nosebleeds and sore throat.   Eyes:  Negative for double vision.  Respiratory:  Negative for cough, hemoptysis, sputum production and wheezing.   Cardiovascular:  Negative for chest pain, palpitations, orthopnea and leg swelling.  Gastrointestinal:  Negative for  abdominal pain, blood in stool, constipation and melena.  Genitourinary:  Negative for dysuria, frequency and urgency.  Musculoskeletal:  Positive for back pain and neck pain. Negative for joint pain.  Neurological:  Positive for tingling. Negative for dizziness, focal weakness, weakness and headaches.  Endo/Heme/Allergies:  Does not bruise/bleed easily.  Psychiatric/Behavioral:  Negative for depression. The patient is not nervous/anxious and does not have insomnia.      MEDICAL HISTORY:  Past Medical History:  Diagnosis Date   Cancer [facility]) 2021   right breast   Cyst, breast    benign   Diabetes mellitus without complication ([place])    Family history of melanoma    Family history of ovarian cancer    GERD (gastroesophageal reflux disease)    H/O: rheumatic fever    History of colonoscopy 2010   done bc of bleeding,  normal , due back in 5 yrs ([place])   Hyperlipidemia    Hypertension    Menopause    at age 70   Personal history of chemotherapy 09/2019   RIGHT  INVASIVE MAMMARY CARCINOMA   Personal history of radiation therapy     SURGICAL HISTORY: Past Surgical History:  Procedure Laterality Date   APPENDECTOMY  2006   BREAST BIOPSY Right 09/22/2019   INVASIVE MAMMARY CARCINOMA   BREAST CYST ASPIRATION Left 1999   BREAST LUMPECTOMY Left 04/23/2020   surgery with NL and SN    BREAST LUMPECTOMY WITH NEEDLE LOCALIZATION Right 04/23/2020   Procedure: BREAST LUMPECTOMY WITH NEEDLE LOCALIZATION;  Surgeon: Robert Bellow, MD;  Location: [facility] ORS;  Service: General;  Laterality: Right;   BREAST LUMPECTOMY WITH SENTINEL LYMPH NODE BIOPSY Right 04/23/2020   Procedure: BREAST LUMPECTOMY WITH SENTINEL LYMPH NODE BX;  Surgeon: Robert Bellow, MD;  Location: [facility] ORS;  Service: General;  Laterality: Right;   CHOLECYSTECTOMY  2006   COLONOSCOPY     PORTACATH PLACEMENT Left 10/06/2019   Procedure: INSERTION PORT-A-CATH;  Surgeon: Robert Bellow, MD;  Location: [facility] ORS;   Service: General;  Laterality: Left;   SUBMUCOSAL TATTOO INJECTION Right 10/06/2019   Procedure: Right axillary TATTOO INJECTION;  Surgeon: Robert Bellow, MD;  Location: [facility] ORS;  Service: General;  Laterality: Right;   VAGINAL DELIVERY     x3    SOCIAL HISTORY: Social History   Socioeconomic History   Marital status: Married    Spouse name: Chrissie Noa   Number of children: 3   Years of education: Not on file   Highest education level: Not on file  Occupational History   Occupation: Glass blower/designer: [id number]  Tobacco Use   Smoking status: Former    Years: 1.00    Types: Cigarettes    Quit date: 12/26/2005    Years since quitting: 16.1   Smokeless tobacco: Never   Tobacco comments:    smoked for less than 1 years,  1 cig/day  Vaping Use   Vaping Use: Never used  Substance and Sexual Activity   Alcohol use: No   Drug use: No   Sexual activity: Never    Partners: Female  Other Topics Concern   Not on file  Social History Narrative   Widowed, March 2014; remarried.      [employer]; quit smoking 1ppw; no alcohol.       Social Determinants of Health   Financial Resource Strain: Medium Risk (05/25/2021)   Overall Financial Resource Strain (CARDIA)    Difficulty of Paying Living Expenses: Somewhat hard  Food Insecurity: No Food Insecurity (05/18/2021)   Hunger Vital Sign    Worried About Running Out of Food in the Last Year: Never true    Ran Out of Food in the Last Year: Never true  Transportation Needs: No Transportation Needs (05/18/2021)   PRAPARE - Hydrologist (Medical): No    Lack of Transportation (Non-Medical): No  Physical Activity: Inactive (05/18/2021)   Exercise Vital Sign    Days of Exercise per Week: 0 days    Minutes of Exercise per Session: 0 min  Stress: No Stress Concern Present (05/18/2021)   [place]    Feeling of Stress : Not at all  Social  Connections: [place] (05/18/2021)   Social Connection and Isolation Panel [NHANES]    Frequency of Communication with Friends and Family: More than three times a week    Frequency of Social Gatherings with Friends and Family: More than three times a week    Attends Religious Services: More than 4 times per year    Active Member of [employer] or Organizations: Yes    Attends Music therapist: More than 4 times per year    Marital Status: Married  Human resources officer Violence: Not At Risk (05/04/2021)   Humiliation, Afraid, Rape, and Kick questionnaire    Fear of Current or Ex-Partner: No    Emotionally Abused: No    Physically Abused: No    Sexually Abused: No    FAMILY HISTORY: Family History  Problem Relation Age of Onset   Cancer Mother 12       ovarian- died 4-5 years.    Heart disease Mother 71   Diabetes Mother  Stroke Father 56   Diabetes Father    Diabetes Sister    Melanoma Sister        survived   Breast cancer Neg Hx     ALLERGIES:  has No Known Allergies.  MEDICATIONS:  Current Outpatient Medications  Medication Sig Dispense Refill   ALPRAZolam (XANAX) 0.25 MG tablet Take 1 tablet (0.25 mg total) by mouth 3 (three) times daily as needed for anxiety. 90 tablet 0   calcium carbonate (OS-CAL) 600 MG TABS tablet Take 600 mg by mouth daily.     Continuous Blood Gluc Sensor (FREESTYLE LIBRE 3 SENSOR) MISC Apply 1 each topically every 14 (fourteen) days. Place 1 sensor on the skin every 14 days. Use to check glucose continuously 6 each 3   dicyclomine (BENTYL) 20 MG tablet Take 1 tablet (20 mg total) by mouth every 8 (eight) hours as needed for spasms. 30 tablet 0   diphenoxylate-atropine (LOMOTIL) 2.5-0.025 MG tablet Take 1 tablet by mouth 4 (four) times daily as needed for diarrhea or loose stools. Take it along with immodium 60 tablet 0   DULoxetine (CYMBALTA) 30 MG capsule Take 1 capsule (30 mg total) by mouth daily. 30 capsule 0   gabapentin  (NEURONTIN) 100 MG capsule TAKE 2 CAPSULES BY MOUTH AT BEDTIME 180 capsule 0   glipiZIDE (GLUCOTROL XL) 10 MG 24 hr tablet Take 1 tablet by mouth once daily with breakfast 90 tablet 0   HYDROcodone-acetaminophen (NORCO) 10-325 MG tablet Take 1 tablet by mouth every 4 (four) hours as needed. 60 tablet 0   KLOR-CON M20 20 MEQ tablet Take 1 tablet by mouth twice daily 60 tablet 0   levothyroxine (SYNTHROID) 100 MCG tablet TAKE 1 TABLET BY MOUTH ONCE DAILY IN THE MORNING BEFORE BREAKFAST 90 tablet 0   lidocaine-prilocaine (EMLA) cream Apply 1 application topically as needed. Apply to port and cover with saran wrap 1-2 hours prior to port access 30 g 1   losartan-hydrochlorothiazide (HYZAAR) 50-12.5 MG tablet Take 1 tablet by mouth once daily 90 tablet 0   lovastatin (MEVACOR) 40 MG tablet TAKE 1 TABLET BY MOUTH AT BEDTIME 90 tablet 0   Magnesium Cl-Calcium Carbonate (SLOW MAGNESIUM/CALCIUM) 70-117 MG TBEC Take 1 tablet by mouth 2 (two) times daily. 60 tablet 6   metFORMIN (GLUCOPHAGE) 1000 MG tablet TAKE 2 TABLETS BY MOUTH ONCE DAILY WITH BREAKFAST 180 tablet 0   morphine (MS CONTIN) 15 MG 12 hr tablet Take 1 tablet (15 mg total) by mouth every 12 (twelve) hours. 60 tablet 0   Multiple Vitamin (MULTIVITAMIN) tablet Take 1 tablet by mouth daily.     OLANZapine (ZYPREXA) 5 MG tablet Take 2 tablets (10 mg total) by mouth at bedtime. 30 tablet 2   omeprazole (PRILOSEC) 20 MG capsule Take 1 capsule (20 mg total) by mouth daily. 90 capsule 3   ondansetron (ZOFRAN) 8 MG tablet One pill every 8 hours as needed for nausea/vomitting. 40 tablet 1   prochlorperazine (COMPAZINE) 10 MG tablet Take 1 tablet (10 mg total) by mouth every 6 (six) hours as needed for nausea or vomiting. 40 tablet 1   Semaglutide,0.25 or 0.5MG/DOS, (OZEMPIC, 0.25 OR 0.5 MG/DOSE,) 2 MG/1.5ML SOPN Inject 0.5 mg into the skin once a week. 1.5 mL 11   vitamin B-12 (CYANOCOBALAMIN) 100 MCG tablet Take 100 mcg by mouth daily.     No current  facility-administered medications for this visit.   Facility-Administered Medications Ordered in Other Visits  Medication Dose Route Frequency Provider  Last Rate Last Admin   heparin lock flush 100 unit/mL  500 Units Intravenous Once Charlaine Dalton R, MD       sodium chloride flush (NS) 0.9 % injection 10 mL  10 mL Intravenous Once Verlon Au, NP          .  PHYSICAL EXAMINATION: ECOG PERFORMANCE STATUS: 0 - Asymptomatic  Vitals:   02/21/22 1000  BP: 112/68  Pulse: 90  Resp: 16  Temp: 98.2 F (36.8 C)  SpO2: 96%     Filed Weights   02/21/22 1000  Weight: 167 lb 3.2 oz (75.8 kg)     Right supraclavicular lymphadenopathy felt. 2-3 cm in size.   Physical Exam HENT:     Head: Normocephalic and atraumatic.     Mouth/Throat:     Pharynx: No oropharyngeal exudate.  Eyes:     Pupils: Pupils are equal, round, and reactive to light.  Cardiovascular:     Rate and Rhythm: Normal rate and regular rhythm.  Pulmonary:     Effort: Pulmonary effort is normal. No respiratory distress.     Breath sounds: Normal breath sounds. No wheezing.  Abdominal:     General: Bowel sounds are normal. There is no distension.     Palpations: Abdomen is soft. There is no mass.     Tenderness: There is no abdominal tenderness. There is no guarding or rebound.  Musculoskeletal:        General: No tenderness. Normal range of motion.     Cervical back: Normal range of motion and neck supple.  Skin:    General: Skin is warm.  Neurological:     Mental Status: She is alert and oriented to person, place, and time.  Psychiatric:        Mood and Affect: Affect normal.      LABORATORY DATA:  I have reviewed the data as listed Lab Results  Component Value Date   WBC 3.3 (L) 02/21/2022   HGB 6.4 (L) 02/21/2022   HCT 19.8 (L) 02/21/2022   MCV 85.7 02/21/2022   PLT 46 (L) 02/21/2022   Recent Labs    02/10/22 1243 02/14/22 0945 02/21/22 0958  NA 135 131* 131*  K 4.2 3.5 3.8  CL  101 98 97*  CO2 _0 GLUCOSE 209* 285* 199*  BUN _1 CREATININE 0.67 0.63 0.53  CALCIUM 8.2* 8.4* 8.7*  GFRNONAA >60 >60 >60  PROT 6.7 6.4* 6.5  ALBUMIN 2.4* 2.4* 2.5*  AST 126* 53* 52*  ALT _2 ALKPHOS 671* 416* 323*  BILITOT 0.6 0.5 0.6    RADIOGRAPHIC STUDIES: I have personally reviewed the radiological images as listed and agreed with the findings in the report. CT CHEST ABDOMEN PELVIS W CONTRAST  Result Date: 02/16/2022 CLINICAL DATA:  Stage IV breast cancer. New mandibular metastatic disease on recent MRI. Evaluate for disease progression. * Tracking Code: BO * EXAM: CT CHEST, ABDOMEN, AND PELVIS WITH CONTRAST TECHNIQUE: Multidetector CT imaging of the chest, abdomen and pelvis was performed following the standard protocol during bolus administration of intravenous contrast. RADIATION DOSE REDUCTION: This exam was performed according to the departmental dose-optimization program which includes automated exposure control, adjustment of the mA and/or kV according to patient size and/or use of iterative reconstruction technique. CONTRAST:  125m OMNIPAQUE IOHEXOL 300 MG/ML  SOLN COMPARISON:  CTs of the chest, abdomen and pelvis 10/21/2021. PET-CT 12/02/2021 and whole-body bone scan 11/23/2021. FINDINGS: CT CHEST FINDINGS Cardiovascular: Left  subclavian Port-A-Cath extends to the superior cavoatrial junction. There is atherosclerosis of the aorta. No acute vascular findings. The heart size is normal. There is no pericardial effusion. Mediastinum/Nodes: Enlarged 2.7 x 1.7 cm right supraclavicular node on image 7/2 was incompletely visualized on most recent prior CT, but grossly stable, and seen on more recent PET-CT. No enlarging mediastinal, hilar, axillary or internal mammary lymph nodes are identified. Stable chronic esophageal wall thickening without focal abnormality. The thyroid gland demonstrates no significant abnormality. Lungs/Pleura: No pleural effusion or  pneumothorax. As seen on PET-CT, there is right greater than left apical airspace disease with volume loss which is new from previous CT, likely secondary to radiation therapy. No suspicious pulmonary nodules. Musculoskeletal/Chest wall: Right breast masses are noted which were hypermetabolic on PET-CT. The largest mass located laterally measures 3.3 x 3.5 cm on image 42/2, 3.0 x 2.3 cm on previous chest CT. There is diffuse asymmetric dermal thickening in the right breast. Known multifocal osseous metastatic disease is not well seen by CT except at T2 where there is stable chronic sclerosis and a mild chronic superior endplate pathologic fracture. No epidural tumor identified. CT ABDOMEN AND PELVIS FINDINGS Hepatobiliary: There is a new ill-defined low-density lesion inferiorly in the right hepatic lobe which measures 2.9 x 1.7 cm on image 69/2. There was no hypermetabolic activity in this area on PET-CT performed 10 weeks ago, and this could reflect focal fat. No other hepatic lesions are identified. Stable mild biliary dilatation status post cholecystectomy. Pancreas: Unremarkable. No pancreatic ductal dilatation or surrounding inflammatory changes. Spleen: Normal in size without focal abnormality. Adrenals/Urinary Tract: New 1.4 x 1.2 cm left adrenal nodule on image 60/2, suspicious for metastatic disease. The right adrenal gland appears normal. No evidence of urinary tract calculus, suspicious renal lesion or hydronephrosis. Small low-density renal lesions are unchanged, likely cysts; no specific follow-up imaging recommended. The bladder appears unremarkable for its degree of distention. Stomach/Bowel: The stomach appears unremarkable for its degree of distension. No evidence of bowel wall thickening, distention or surrounding inflammatory change. Retrocecal surgical clips consistent with previous appendectomy. Vascular/Lymphatic: New mildly enlarged right retrocrural lymph nodes measuring up to 8 mm on image  63/2. New soft tissue nodule inferior to the right adrenal gland, measuring 1.2 x 0.8 cm on image 66/2. New 6 mm soft tissue nodule inferior to the right kidney on image 85/2. Aortic and branch vessel atherosclerosis without aneurysm or large vessel occlusion. Reproductive: New heterogeneous central low-density within the uterus, suspicious for endometrial thickening up to 2.2 cm on image 121/6. No adnexal mass. Other: Small amount of free pelvic fluid without associated peritoneal nodularity. As above, new small retroperitoneal soft tissue nodules, suspicious for metastatic disease. Musculoskeletal: Grossly stable metastatic disease within the bony pelvis and lumbar spine. Chronic anterolisthesis at L5-S1 with left-sided pars defect. No pathologic fracture identified. IMPRESSION: 1. Grossly stable multifocal osseous metastatic disease (better seen on PET-CT and bone scan). 2. As seen on most recent PET-CT, there are enlarging right breast masses compared with previous chest CT and a right supraclavicular nodal metastasis. 3. New left adrenal nodule, right retrocrural lymph nodes and retroperitoneal soft tissue nodules are suspicious for metastatic disease. New ill-defined low-density inferiorly in the right hepatic lobe without PET-CT correlate, potentially focal fat. Given the other new findings, this could reflect metastatic disease as well. 4. New heterogeneous central low-density within the uterus, suspicious for endometrial thickening. Recommend further evaluation with pelvic ultrasound. 5. Probable radiation changes at both lung apices, similar  to PET-CT. No evidence of pulmonary metastatic disease. 6. Aortic Atherosclerosis (ICD10-I70.0). Electronically Signed   By: Richardean Sale M.D.   On: 02/16/2022 13:41   MR BRAIN W WO CONTRAST  Result Date: 02/10/2022 CLINICAL DATA:  Stage IV cancer with chin numbness. EXAM: MRI HEAD WITHOUT AND WITH CONTRAST MRI FACE TRIGEMINAL WITHOUT AND WITH CONTRAST  TECHNIQUE: Multiplanar, multi-echo pulse sequences of the face and surrounding structures, including thin-slice imaging of the trigeminal nerves, were acquired before and after intravenous contrast administration. Multiplanar, multiecho pulse sequences of the brain and surrounding structures were obtained without and with intravenous contrast. CONTRAST:  7.62m GADAVIST GADOBUTROL 1 MMOL/ML IV SOLN COMPARISON:  CT head 02/10/2022 and CT neck 10/21/2021 FINDINGS: MR HEAD: Brain: There is no acute intracranial hemorrhage, extra-axial fluid collection, or acute infarct. Parenchymal volume is normal. The ventricles are normal in size. Gray-white differentiation is preserved. Parenchymal signal is normal, with no significant burden of underlying chronic small-vessel ischemic change. There is no mass lesion. There is no mass effect or midline shift. There is no abnormal enhancement. There is no evidence of nose and meningeal metastatic disease. Vascular: Normal flow voids. Skull and upper cervical spine: There is extensive signal abnormality throughout the calvarium, mandible, and upper cervical spine consistent with extensive osseous metastatic disease. Sinuses/Orbits: The paranasal sinuses are clear. The globes and orbits are unremarkable. Other: None MR FACE: There is extensive signal abnormality in the mandible bilaterally consistent with osseous metastatic disease. There is enhancement within the soft tissues of the opening of the right mandibular nerve canal (11-17). There is no other convincing evidence of soft tissue tumor or perineural spread of tumor. IMPRESSION: 1. 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 2. Small focus of soft tissue enhancement adjacent to the right mandibular foramen concerning for perineural spread along the mandibular nerve. No other convincing evidence of soft tissue tumor or perineural spread of  tumor. 3. No acute intracranial pathology or evidence of intracranial metastatic disease. Electronically Signed   By: PValetta MoleM.D.   On: 02/10/2022 16:35   MR FACE/TRIGEMINAL WO/W CM  Result Date: 02/10/2022 CLINICAL DATA:  Stage IV cancer with chin numbness. EXAM: MRI HEAD WITHOUT AND WITH CONTRAST MRI FACE TRIGEMINAL WITHOUT AND WITH CONTRAST TECHNIQUE: Multiplanar, multi-echo pulse sequences of the face and surrounding structures, including thin-slice imaging of the trigeminal nerves, were acquired before and after intravenous contrast administration. Multiplanar, multiecho pulse sequences of the brain and surrounding structures were obtained without and with intravenous contrast. CONTRAST:  7.527mGADAVIST GADOBUTROL 1 MMOL/ML IV SOLN COMPARISON:  CT head 02/10/2022 and CT neck 10/21/2021 FINDINGS: MR HEAD: Brain: There is no acute intracranial hemorrhage, extra-axial fluid collection, or acute infarct. Parenchymal volume is normal. The ventricles are normal in size. Gray-white differentiation is preserved. Parenchymal signal is normal, with no significant burden of underlying chronic small-vessel ischemic change. There is no mass lesion. There is no mass effect or midline shift. There is no abnormal enhancement. There is no evidence of nose and meningeal metastatic disease. Vascular: Normal flow voids. Skull and upper cervical spine: There is extensive signal abnormality throughout the calvarium, mandible, and upper cervical spine consistent with extensive osseous metastatic disease. Sinuses/Orbits: The paranasal sinuses are clear. The globes and orbits are unremarkable. Other: None MR FACE: There is extensive signal abnormality in the mandible bilaterally consistent with osseous metastatic disease. There is enhancement within the soft tissues of the opening of  the right mandibular nerve canal (11-17). There is no other convincing evidence of soft tissue tumor or perineural spread of tumor. IMPRESSION:  1. 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 2. Small focus of soft tissue enhancement adjacent to the right mandibular foramen concerning for perineural spread along the mandibular nerve. No other convincing evidence of soft tissue tumor or perineural spread of tumor. 3. No acute intracranial pathology or evidence of intracranial metastatic disease. Electronically Signed   By: Valetta Mole M.D.   On: 02/10/2022 16:35   CT HEAD WO CONTRAST  Result Date: 02/10/2022 CLINICAL DATA:  Numbness EXAM: CT HEAD WITHOUT CONTRAST TECHNIQUE: Contiguous axial images were obtained from the base of the skull through the vertex without intravenous contrast. RADIATION DOSE REDUCTION: This exam was performed according to the departmental dose-optimization program which includes automated exposure control, adjustment of the mA and/or kV according to patient size and/or use of iterative reconstruction technique. COMPARISON:  12/02/2010 FINDINGS: Brain: No acute intracranial findings are seen. There are no signs of bleeding within the cranium. Calcifications are seen in basal ganglia. There is slight prominence of lateral ventricles. Cortical sulci are prominent. Vascular: Unremarkable. Skull: Unremarkable. Sinuses/Orbits: Unremarkable. Other: None. IMPRESSION: No acute intracranial findings are seen in noncontrast CT brain. Electronically Signed   By: Elmer Picker M.D.   On: 02/10/2022 13:44    ASSESSMENT & PLAN:   Carcinoma of upper-inner quadrant of right breast in female, estrogen receptor negative ([place]) #Recurrent right breast cancer-s/p breast skin biopsy [FEB 2023- ER positive /PR HER2-0; however 2017-ER-NEGATIVE; -HER2 NEU-1+/LOW]; - recurrent- stage IV; NGS -FEB 2023- CPS =15%. Currently on Keytruda- taxol [2/13]; BONE SCAN- November 24, 2021- Scattered sites of abnormal osseous tracer uptake  consistent with osseous  metastases, including BILATERAL femora.   12/02/2021- PET SCAN-  Three hypermetabolic right breast nodules with a right supraclavicular nodal metastasis and diffuse osseous metastatic disease.  Subpleural nodular consolidation in the apical left upper lobe is new from 10/21/2021 and may be infectious/inflammatory in etiology or related to evolving radiation therapy.   #Patient currently  S/p  Sacituzumab govitecan HOLD chemotherapy cycle number 3 day 15.  Patient tolerating chemotherapy with mild to moderate difficulty- see below-diarrhea/fatigue/poor taste/worsening pain weight loss. CT scan NOV 2023-shows progressive disease in the breast and also right supraclavicular lymph node; possible liver lesion and also retrocrural/retroperitoneal soft tissue subcentimeter masses noted.  All concerning for progressive disease.   # Given the poor tolerance; and the progressive disease recommend discontinuation of Sacituzumab therapy.  Discussed options-# holding therapy-and reassess in the next 4 weeks or so for consideration of ongoing chemotherapy [discussed with family/patient that this is statistically less likely as patient is progressive disease].  Also discussed regarding hospice.  Patient interested in hospice referral at this time.  # Right facial numbness- NOV 17th MRI Brain/mandible-  Diffusely abnormal bone marrow consistent with extensive osseous metastatic disease which involves most of the mandible and skull base;  Small focus of soft tissue enhancement adjacent to the right mandibular foramen concerning for perineural spread along the mandibular nerve. No other convincing evidence of soft tissue tumor or perineural spread of tumor. No acute intracranial pathology or evidence of intracranial metastatic disease.  Discussed with Dr. Carlynn Spry the lack of pain; and the risk of mucositis hold off any radiation at this time.  He also agrees with hospice.  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  Continue fentanyl patch to 50 mcg.   #Fatigue multifactorial-anemia hemoglobin 6.4-secondary chemotherapy.  Proceed with PRBC transfusion tomorrow.  Ordered.  # Diarrhea- G-1- recommend continue lomotil as planned.   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   # PN-1-2 sec to taxol-on Neurontin 200 qhs-STABLE.   # Weight loss/lack of appetite/difficulty sleeping at night-recommend olanzapine 5 mg nightly.  New prescription sent.  # Prognosis: I had a long discussion with the patient and her husband regarding her overall poor prognosis-and the fact that patient has progressive disease in the context of poor tolerance to therapy.  Discussed at length regarding the fact that patient will likely not be strong enough to proceed with any further therapies.  Discussed regarding hospice; will make a hospice referral.  I offered to speak to patient's rest of the family; they declined at this time.  # Mediport: Functioning.   # DISPOSITION: # No Chemo today; keep the port access # Hospice referral re: Breast cancer # 1 unit PRBC tomorrow # Follow up in 4 weeks- MD; labs- cbc/cmp; magnesium; NO chemo- Dr.B  # I reviewed the blood work- with the patient in detail; also reviewed the imaging independently [as summarized above]; and with the patient in detail.    # 40 minutes face-to-face with the patient discussing the above plan of care; more than 50% of time spent on prognosis/ natural history; counseling and coordination.         All questions were answered. The patient/family knows to call the clinic with any problems, questions or concerns.   Cammie Sickle, MD 02/21/2022 12:08 PM

## 2022-02-21 NOTE — Telephone Encounter (Signed)
Barbara from Dr. Aletha Halim office [facility]) called to state patient is going to Hospice and won't be coming to her appointment with Dr. Deborra Medina tomorrow.

## 2022-02-21 NOTE — Telephone Encounter (Signed)
Spoke with Hospice to let them know that Dr. Derrel Nip will be the attending physician.

## 2022-02-21 NOTE — Progress Notes (Signed)
Patient having difficulty with deep breathing.  Still having chin numbness.  Not sleeping well and not taking anything to help and wanted to talk with Dr. B about what she can take with her other medications.  No appetite with continued change in taste.  Now taking Alprazolam 0.25 mg tid that is helping with anxiousness.

## 2022-02-22 ENCOUNTER — Other Ambulatory Visit: Payer: Self-pay

## 2022-02-22 ENCOUNTER — Ambulatory Visit: Payer: PPO | Admitting: Internal Medicine

## 2022-02-22 ENCOUNTER — Ambulatory Visit
Admission: RE | Admit: 2022-02-22 | Discharge: 2022-02-22 | Disposition: A | Discharge status: Home / Self Care | Payer: PPO | Source: Ambulatory Visit | Attending: Radiation Oncology | Admitting: Radiation Oncology

## 2022-02-22 ENCOUNTER — Inpatient Hospital Stay: Payer: PPO

## 2022-02-22 DIAGNOSIS — D4861 Neoplasm of uncertain behavior of right breast: Secondary | ICD-10-CM

## 2022-02-22 DIAGNOSIS — Z510 Encounter for antineoplastic radiation therapy: Secondary | ICD-10-CM | POA: Diagnosis not present

## 2022-02-22 DIAGNOSIS — Z171 Estrogen receptor negative status [ER-]: Secondary | ICD-10-CM

## 2022-02-22 DIAGNOSIS — C50911 Malignant neoplasm of unspecified site of right female breast: Secondary | ICD-10-CM | POA: Diagnosis not present

## 2022-02-22 DIAGNOSIS — C7951 Secondary malignant neoplasm of bone: Secondary | ICD-10-CM | POA: Diagnosis not present

## 2022-02-22 LAB — RAD ONC ARIA SESSION SUMMARY
Course Elapsed Days: 16
Plan Fractions Treated to Date: 10
Plan Prescribed Dose Per Fraction: 3 Gy
Plan Total Fractions Prescribed: 10
Plan Total Prescribed Dose: 30 Gy
Reference Point Dosage Given to Date: 30 Gy
Reference Point Session Dosage Given: 3 Gy
Session Number: 10

## 2022-02-22 MED ORDER — SODIUM CHLORIDE 0.9% IV SOLUTION
250.0000 mL | Freq: Once | INTRAVENOUS | Status: AC
  Administered 2022-02-22: 250 mL via INTRAVENOUS
  Filled 2022-02-22: qty 250

## 2022-02-22 MED ORDER — DIPHENHYDRAMINE HCL 25 MG PO CAPS
25.0000 mg | ORAL_CAPSULE | Freq: Once | ORAL | Status: AC
  Administered 2022-02-22: 25 mg via ORAL
  Filled 2022-02-22: qty 1

## 2022-02-22 MED ORDER — HEPARIN SOD (PORK) LOCK FLUSH 100 UNIT/ML IV SOLN
500.0000 [IU] | Freq: Every day | INTRAVENOUS | Status: DC | PRN
  Filled 2022-02-22: qty 5

## 2022-02-22 MED ORDER — SODIUM CHLORIDE 0.9% FLUSH
10.0000 mL | INTRAVENOUS | Status: DC | PRN
  Filled 2022-02-22: qty 10

## 2022-02-22 MED ORDER — ACETAMINOPHEN 325 MG PO TABS
650.0000 mg | ORAL_TABLET | Freq: Once | ORAL | Status: AC
  Administered 2022-02-22: 650 mg via ORAL
  Filled 2022-02-22: qty 2

## 2022-02-22 NOTE — Patient Instructions (Signed)
[facility] [place]  Discharge Instructions: Thank you for choosing [facility] to provide your oncology and hematology care.  If you have a lab appointment with the [facility], please go directly to the [facility] and check in at the registration area.  Wear comfortable clothing and clothing appropriate for easy access to any Portacath or PICC line.   We strive to give you quality time with your provider. You may need to reschedule your appointment if you arrive late (15 or more minutes).  Arriving late affects you and other patients whose appointments are after yours.  Also, if you miss three or more appointments without notifying the office, you may be dismissed from the clinic at the provider's discretion.      For prescription refill requests, have your pharmacy contact our office and allow 72 hours for refills to be completed.    Today you received the following chemotherapy and/or immunotherapy agents blood transfusion      To help prevent nausea and vomiting after your treatment, we encourage you to take your nausea medication as directed.  BELOW ARE SYMPTOMS THAT SHOULD BE REPORTED IMMEDIATELY: *FEVER GREATER THAN 100.4 F (38 C) OR HIGHER *CHILLS OR SWEATING *NAUSEA AND VOMITING THAT IS NOT CONTROLLED WITH YOUR NAUSEA MEDICATION *UNUSUAL SHORTNESS OF BREATH *UNUSUAL BRUISING OR BLEEDING *URINARY PROBLEMS (pain or burning when urinating, or frequent urination) *BOWEL PROBLEMS (unusual diarrhea, constipation, pain near the anus) TENDERNESS IN MOUTH AND THROAT WITH OR WITHOUT PRESENCE OF ULCERS (sore throat, sores in mouth, or a toothache) UNUSUAL RASH, SWELLING OR PAIN  UNUSUAL VAGINAL DISCHARGE OR ITCHING   Items with * indicate a potential emergency and should be followed up as soon as possible or go to the Emergency Department if any problems should occur.  Please show the CHEMOTHERAPY ALERT CARD or IMMUNOTHERAPY ALERT CARD at  check-in to the Emergency Department and triage nurse.  Should you have questions after your visit or need to cancel or reschedule your appointment, please contact [facility] [place] [facility] [place]  [phone] and follow the prompts.  Office hours are 8:00 a.m. to 4:30 p.m. Monday - Friday. Please note that voicemails left after 4:00 p.m. may not be returned until the following business day.  We are closed weekends and major holidays. You have access to a nurse at all times for urgent questions. Please call the main number to the clinic [phone] and follow the prompts.  For any non-urgent questions, you may also contact your provider using MyChart. We now offer e-Visits for anyone 60 and older to request care online for non-urgent symptoms. For details visit [email or web address].   Also download the MyChart app! Go to the app store, search "MyChart", open the app, select [facility], and log in with your MyChart username and password.  Masks are optional in the cancer centers. If you would like for your care team to wear a mask while they are taking care of you, please let them know. For doctor visits, patients may have with them one support person who is at least 69 years old. At this time, visitors are not allowed in the infusion area.

## 2022-02-23 LAB — BPAM RBC
Blood Product Expiration Date: 202312022359
Blood Product Expiration Date: 202312202359
ISSUE DATE / TIME: 202311290854
Unit Type and Rh: 600
Unit Type and Rh: 6200

## 2022-02-23 LAB — TYPE AND SCREEN
ABO/RH(D): A POS
Antibody Screen: NEGATIVE
Unit division: 0
Unit division: 0

## 2022-02-24 ENCOUNTER — Ambulatory Visit: Payer: Self-pay

## 2022-02-24 NOTE — Patient Outreach (Signed)
  Care Coordination   02/24/2022 Name: Ashlee Cox MRN: [id number] DOB: [birth year]   Care Coordination Outreach Attempts:  An unsuccessful telephone outreach was attempted for a scheduled appointment today. Unable to reach patient or leave voicemail message.  Message states mailbox is full.   Follow Up Plan:  Additional outreach attempts will be made to offer the patient care coordination information and services.   Encounter Outcome:  No Answer   Care Coordination Interventions:  No, not indicated    [name] [facility] [phone] direct line

## 2022-02-27 ENCOUNTER — Other Ambulatory Visit: Payer: Self-pay | Admitting: Nurse Practitioner

## 2022-02-27 DIAGNOSIS — Z171 Estrogen receptor negative status [ER-]: Secondary | ICD-10-CM

## 2022-03-02 ENCOUNTER — Other Ambulatory Visit: Payer: Self-pay | Admitting: Hospice and Palliative Medicine

## 2022-03-02 MED ORDER — MORPHINE SULFATE ER 15 MG PO TBCR: 15.0000 mg | EXTENDED_RELEASE_TABLET | Freq: Two times a day (BID) | ORAL | 0 refills | Status: AC

## 2022-03-02 MED ORDER — HYDROCODONE-ACETAMINOPHEN 10-325 MG PO TABS: 1.0000 | ORAL_TABLET | ORAL | 0 refills | Status: DC | PRN

## 2022-03-02 MED ORDER — MORPHINE SULFATE (CONCENTRATE) 10 MG /0.5 ML PO SOLN: 5.0000 mg | ORAL | 0 refills | Status: AC | PRN

## 2022-03-02 NOTE — Addendum Note (Signed)
Addended by: Altha Harm R on: 03/02/2022 10:31 AM   Modules accepted: Orders

## 2022-03-02 NOTE — Progress Notes (Addendum)
Hospice nurse, Amy, requested refill of MS Contin and to start patient on Roxanol. Patient is declining and goal is comfort.

## 2022-03-03 ENCOUNTER — Telehealth: Payer: Self-pay | Admitting: *Deleted

## 2022-03-03 NOTE — Progress Notes (Signed)
  Care Coordination Note  03/03/2022 Name: [name] MRN: [id number] DOB: 04-16-1950  Ashlee Cox is a 71 y.o. year old female who is a primary care patient of Derrel Nip, Aris [place], MD and is actively engaged with the care management team. I reached out to [employer] by phone today to assist with re-scheduling a follow up visit with the RN Case Manager  Follow up plan: Per pt husband - pt is under hospice care and services no longer needed.   Julian Hy, [place] Direct Dial: [phone]

## 2022-03-08 ENCOUNTER — Telehealth: Payer: Self-pay | Admitting: Hospice and Palliative Medicine

## 2022-03-08 NOTE — Telephone Encounter (Signed)
Received call from hospice nurse, Amy.  Reportedly, patient is having anxiety/difficulty sleeping at night.  Okay to increase bedtime dose of alprazolam to 0.5 mg.

## 2022-03-15 ENCOUNTER — Inpatient Hospital Stay: Payer: PPO | Admitting: Hospice and Palliative Medicine

## 2022-03-21 ENCOUNTER — Other Ambulatory Visit: Payer: PPO

## 2022-03-21 ENCOUNTER — Ambulatory Visit: Payer: PPO | Admitting: Internal Medicine

## 2022-03-21 ENCOUNTER — Ambulatory Visit: Payer: PPO

## 2022-03-27 DEATH — deceased

## 2022-04-03 ENCOUNTER — Ambulatory Visit: Payer: PPO | Admitting: Radiation Oncology

## 2022-04-24 ENCOUNTER — Inpatient Hospital Stay: Admission: RE | Admit: 2022-04-24 | Payer: PPO | Source: Ambulatory Visit | Admitting: Radiation Oncology

## 2023-06-05 IMAGING — PT NM PET [PERSON_NAME] (PS) SKULL BASE T - THIGH
1 of 10 series · 1 of 25 positions shown · non-contrast
Comparison: 02/02/2021 chest abdomen and pelvic CTs. Most recent
PET 10/03/2019.

CLINICAL DATA: Subsequent treatment strategy for right-sided breast
cancer. Pre systemic therapy.

EXAM:
NUCLEAR MEDICINE PET SKULL BASE TO THIGH
TECHNIQUE: 9.7 mCi F-18 FDG was injected intravenously. Full-ring PET imaging
was performed from the skull base to thigh after the radiotracer. CT
data was obtained and used for attenuation correction and anatomic
localization.
Fasting blood glucose: 204 mg/dl

[Series 3: ct wb 5.0 b30f · axial · 5.0mm · 0.98mm/px · 1 of 290 slices shown]
[im 290/290  brain]
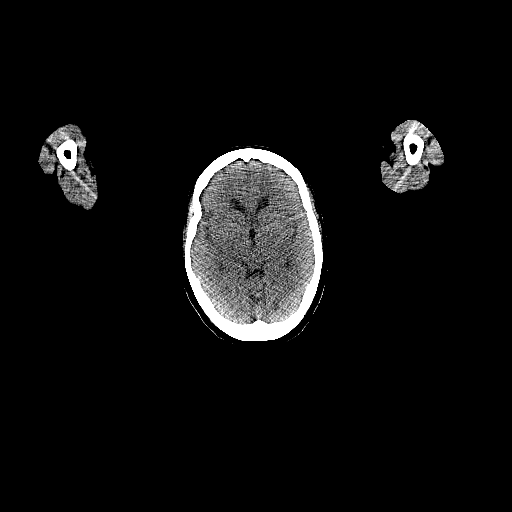

[1 of 25 positions shown; findings below may reference images not displayed]

FINDINGS: Mediastinal blood pool activity: SUV max

Liver activity: SUV max NA

NECK: A right posterior triangle/supraclavicular node is new since
the prior PET, measuring 1.0 cm and a S.U.V. max of 2.3 on 48/3.

Incidental CT findings: No other enlarged cervical nodes.

CHEST: The previously described right axillary hypermetabolic node
has resolved.

Hypermetabolic mediastinal nodes are again identified. A left
mediastinal/AP window node measures 1.3 cm and a S.U.V. max of
on 82/3. Compare 8 mm and a S.U.V. max of 3.9 on the prior exam.

Right paratracheal node measures 11 mm and a S.U.V. max of 3.7 on
81/3 versus 7 mm and a S.U.V. max of 2.8 on the prior exam (when
remeasured).

New right breast skin thickening. Residual primarily central and
lateral right breast hypermetabolism without well-defined residual
mass. Example at a S.U.V. max of 4.0. On the prior PET, the primary
measured a S.U.V. max of 7.0.

Incidental CT findings: Left Port-A-Cath tip high right atrium. Mild
cardiomegaly. Aortic atherosclerosis. Tiny hiatal hernia.

ABDOMEN/PELVIS: No abdominopelvic parenchymal or nodal
hypermetabolism.

Incidental CT findings: Moderate hepatic steatosis with nonspecific
caudate lobe enlargement. Cholecystectomy. Normal noncontrast
appearance of the pancreas, adrenal glands, kidneys. Abdominal
aortic atherosclerosis.

SKELETON: Mild hypermetabolism at the T2 level is new at a S.U.V.
max of 3.4. No compression deformity or dominant lesion in this area
on nondedicated CT.

Incidental CT findings: Bilateral L5 pars defects with grade 1 L5-S1
anterolisthesis. Trace L4-5 anterolisthesis.
IMPRESSION: 1. Although the hypermetabolic right axillary node has resolved
since the prior PET, there is a new hypermetabolic right low
cervical/supraclavicular node, suspicious for nodal metastasis.
2. Decrease in right breast hypermetabolism with nonspecific
overlying skin thickening in the setting of prior lumpectomy.
3. Low-level hypermetabolism within the T2 vertebral body,
suspicious for isolated osseous metastasis. This could be confirmed
with pre and postcontrast thoracic spine MRI.
4. Hypermetabolic thoracic nodes, slightly progressive. Given
progression, nodal metastasis are slightly favored over reactive
etiologies.
5. Incidental findings, including: Aortic Atherosclerosis
([id number].S). Tiny hiatal hernia. Hepatic steatosis with
nonspecific caudate lobe enlargement.
